# Patient Record
Sex: Female | Born: 1940 | Race: Black or African American | Hispanic: No | Marital: Single | Smoking: Former smoker
Health system: Southern US, Community
[De-identification: ages and names within clinical notes are randomized; demographics above are authoritative.]

## PROBLEM LIST (undated history)

## (undated) DIAGNOSIS — E079 Disorder of thyroid, unspecified: Secondary | ICD-10-CM

## (undated) DIAGNOSIS — E785 Hyperlipidemia, unspecified: Secondary | ICD-10-CM

## (undated) DIAGNOSIS — I10 Essential (primary) hypertension: Secondary | ICD-10-CM

## (undated) DIAGNOSIS — I639 Cerebral infarction, unspecified: Secondary | ICD-10-CM

## (undated) DIAGNOSIS — M8580 Other specified disorders of bone density and structure, unspecified site: Secondary | ICD-10-CM

## (undated) DIAGNOSIS — R0602 Shortness of breath: Secondary | ICD-10-CM

## (undated) DIAGNOSIS — N189 Chronic kidney disease, unspecified: Secondary | ICD-10-CM

## (undated) HISTORY — DX: Chronic kidney disease, unspecified: N18.9

## (undated) HISTORY — DX: Hyperlipidemia, unspecified: E78.5

## (undated) HISTORY — PX: EYE SURGERY: SHX253

## (undated) HISTORY — DX: Other specified disorders of bone density and structure, unspecified site: M85.80

## (undated) HISTORY — DX: Cerebral infarction, unspecified: I63.9

---

## 1998-04-02 ENCOUNTER — Other Ambulatory Visit: Admission: RE | Admit: 1998-04-02 | Discharge: 1998-04-02 | Payer: Self-pay | Admitting: Internal Medicine

## 1998-10-13 ENCOUNTER — Emergency Department (HOSPITAL_COMMUNITY): Admission: EM | Admit: 1998-10-13 | Discharge: 1998-10-13 | Payer: Self-pay | Admitting: Emergency Medicine

## 1998-10-19 ENCOUNTER — Inpatient Hospital Stay (HOSPITAL_COMMUNITY): Admission: AD | Admit: 1998-10-19 | Discharge: 1998-10-22 | Payer: Self-pay | Admitting: Internal Medicine

## 1998-10-20 ENCOUNTER — Encounter: Payer: Self-pay | Admitting: Internal Medicine

## 1999-01-04 ENCOUNTER — Encounter: Payer: Self-pay | Admitting: Internal Medicine

## 1999-01-04 ENCOUNTER — Ambulatory Visit (HOSPITAL_COMMUNITY): Admission: RE | Admit: 1999-01-04 | Discharge: 1999-01-04 | Payer: Self-pay | Admitting: Internal Medicine

## 1999-04-05 ENCOUNTER — Emergency Department (HOSPITAL_COMMUNITY): Admission: EM | Admit: 1999-04-05 | Discharge: 1999-04-05 | Payer: Self-pay | Admitting: Emergency Medicine

## 1999-04-18 ENCOUNTER — Emergency Department (HOSPITAL_COMMUNITY): Admission: EM | Admit: 1999-04-18 | Discharge: 1999-04-18 | Payer: Self-pay | Admitting: Emergency Medicine

## 1999-08-22 ENCOUNTER — Encounter: Payer: Self-pay | Admitting: Internal Medicine

## 1999-08-22 ENCOUNTER — Ambulatory Visit (HOSPITAL_COMMUNITY): Admission: RE | Admit: 1999-08-22 | Discharge: 1999-08-22 | Payer: Self-pay | Admitting: Internal Medicine

## 2000-03-23 ENCOUNTER — Other Ambulatory Visit: Admission: RE | Admit: 2000-03-23 | Discharge: 2000-03-23 | Payer: Self-pay | Admitting: Internal Medicine

## 2000-06-18 ENCOUNTER — Emergency Department (HOSPITAL_COMMUNITY): Admission: EM | Admit: 2000-06-18 | Discharge: 2000-06-18 | Payer: Self-pay | Admitting: Emergency Medicine

## 2000-06-18 ENCOUNTER — Encounter: Payer: Self-pay | Admitting: Emergency Medicine

## 2001-01-19 ENCOUNTER — Emergency Department (HOSPITAL_COMMUNITY): Admission: EM | Admit: 2001-01-19 | Discharge: 2001-01-19 | Payer: Self-pay | Admitting: *Deleted

## 2001-07-22 ENCOUNTER — Emergency Department (HOSPITAL_COMMUNITY): Admission: EM | Admit: 2001-07-22 | Discharge: 2001-07-22 | Payer: Self-pay

## 2002-01-25 ENCOUNTER — Inpatient Hospital Stay (HOSPITAL_COMMUNITY): Admission: EM | Admit: 2002-01-25 | Discharge: 2002-02-06 | Payer: Self-pay

## 2002-02-05 ENCOUNTER — Encounter: Payer: Self-pay | Admitting: *Deleted

## 2002-06-25 ENCOUNTER — Emergency Department (HOSPITAL_COMMUNITY): Admission: EM | Admit: 2002-06-25 | Discharge: 2002-06-26 | Payer: Self-pay | Admitting: Emergency Medicine

## 2002-06-26 ENCOUNTER — Encounter: Payer: Self-pay | Admitting: Emergency Medicine

## 2002-12-23 ENCOUNTER — Encounter: Payer: Self-pay | Admitting: Emergency Medicine

## 2002-12-23 ENCOUNTER — Emergency Department (HOSPITAL_COMMUNITY): Admission: EM | Admit: 2002-12-23 | Discharge: 2002-12-23 | Payer: Self-pay | Admitting: Emergency Medicine

## 2003-01-30 ENCOUNTER — Encounter: Admission: RE | Admit: 2003-01-30 | Discharge: 2003-04-30 | Payer: Self-pay

## 2003-12-08 ENCOUNTER — Emergency Department (HOSPITAL_COMMUNITY): Admission: EM | Admit: 2003-12-08 | Discharge: 2003-12-08 | Payer: Self-pay | Admitting: Emergency Medicine

## 2004-08-01 ENCOUNTER — Encounter (INDEPENDENT_AMBULATORY_CARE_PROVIDER_SITE_OTHER): Payer: Self-pay | Admitting: *Deleted

## 2004-08-01 ENCOUNTER — Ambulatory Visit (HOSPITAL_COMMUNITY): Admission: RE | Admit: 2004-08-01 | Discharge: 2004-08-01 | Payer: Self-pay | Admitting: Gastroenterology

## 2006-02-14 ENCOUNTER — Ambulatory Visit (HOSPITAL_COMMUNITY): Admission: RE | Admit: 2006-02-14 | Discharge: 2006-02-14 | Payer: Self-pay | Admitting: Internal Medicine

## 2006-05-10 ENCOUNTER — Emergency Department (HOSPITAL_COMMUNITY): Admission: EM | Admit: 2006-05-10 | Discharge: 2006-05-10 | Payer: Self-pay | Admitting: Emergency Medicine

## 2006-09-01 ENCOUNTER — Emergency Department (HOSPITAL_COMMUNITY): Admission: EM | Admit: 2006-09-01 | Discharge: 2006-09-01 | Payer: Self-pay | Admitting: Emergency Medicine

## 2007-11-20 ENCOUNTER — Emergency Department (HOSPITAL_COMMUNITY): Admission: EM | Admit: 2007-11-20 | Discharge: 2007-11-20 | Payer: Self-pay | Admitting: *Deleted

## 2008-08-12 ENCOUNTER — Emergency Department (HOSPITAL_COMMUNITY): Admission: EM | Admit: 2008-08-12 | Discharge: 2008-08-12 | Payer: Self-pay | Admitting: Family Medicine

## 2008-08-25 ENCOUNTER — Ambulatory Visit (HOSPITAL_COMMUNITY): Admission: RE | Admit: 2008-08-25 | Discharge: 2008-08-25 | Payer: Self-pay | Admitting: Internal Medicine

## 2009-07-15 ENCOUNTER — Emergency Department (HOSPITAL_COMMUNITY): Admission: EM | Admit: 2009-07-15 | Discharge: 2009-07-15 | Payer: Self-pay | Admitting: Emergency Medicine

## 2009-09-10 ENCOUNTER — Ambulatory Visit (HOSPITAL_COMMUNITY): Admission: RE | Admit: 2009-09-10 | Discharge: 2009-09-10 | Payer: Self-pay | Admitting: Internal Medicine

## 2009-12-15 ENCOUNTER — Encounter: Admission: RE | Admit: 2009-12-15 | Discharge: 2009-12-15 | Payer: Self-pay | Admitting: Internal Medicine

## 2009-12-21 ENCOUNTER — Encounter (INDEPENDENT_AMBULATORY_CARE_PROVIDER_SITE_OTHER): Payer: Self-pay | Admitting: *Deleted

## 2009-12-31 ENCOUNTER — Ambulatory Visit: Payer: Self-pay | Admitting: Gastroenterology

## 2009-12-31 ENCOUNTER — Encounter (INDEPENDENT_AMBULATORY_CARE_PROVIDER_SITE_OTHER): Payer: Self-pay | Admitting: *Deleted

## 2010-01-07 ENCOUNTER — Ambulatory Visit: Payer: Self-pay | Admitting: Gastroenterology

## 2010-09-05 ENCOUNTER — Encounter: Admission: RE | Admit: 2010-09-05 | Discharge: 2010-09-07 | Payer: Self-pay | Admitting: Internal Medicine

## 2010-10-27 ENCOUNTER — Inpatient Hospital Stay (HOSPITAL_COMMUNITY)
Admission: EM | Admit: 2010-10-27 | Discharge: 2010-11-01 | Payer: Self-pay | Source: Home / Self Care | Attending: Internal Medicine | Admitting: Internal Medicine

## 2010-10-27 ENCOUNTER — Encounter (INDEPENDENT_AMBULATORY_CARE_PROVIDER_SITE_OTHER): Payer: Self-pay | Admitting: Internal Medicine

## 2010-10-28 ENCOUNTER — Encounter (INDEPENDENT_AMBULATORY_CARE_PROVIDER_SITE_OTHER): Payer: Self-pay | Admitting: Internal Medicine

## 2010-11-01 ENCOUNTER — Ambulatory Visit (HOSPITAL_COMMUNITY)
Admission: RE | Admit: 2010-11-01 | Discharge: 2010-11-01 | Payer: Self-pay | Source: Home / Self Care | Attending: Internal Medicine | Admitting: Internal Medicine

## 2010-11-28 ENCOUNTER — Encounter: Payer: Self-pay | Admitting: Internal Medicine

## 2010-12-04 NOTE — Consult Note (Addendum)
[name] [name], [name] [name]                 ACCOUNT NO.:  [id number]  MEDICAL RECORD NO.:  [id number]          PATIENT TYPE:  INP  LOCATION:  1423                         FACILITY:  [facility]  PHYSICIAN:  Madolyn Frieze. Jens Som, MD, FACCDATE OF BIRTH:  [birth year]  DATE OF CONSULTATION:  10/28/2010 DATE OF DISCHARGE:                                CONSULTATION   PRIMARY CARE PHYSICIAN:  Andi Devon, MD  PRIMARY CARDIOLOGIST:  None.  CHIEF COMPLAINT:  Elevated cardiac enzymes.  HISTORY OF PRESENT ILLNESS:  [name] [name] is a 70 year old female with no previous history of coronary artery disease.  She came in for nausea and vomiting.  She had elevated CK-MBs and Cardiology was asked to evaluate her.  [name] [name] has no history of exertional or resting chest pain.  She does not exert herself much because of the previous CVA.  She does various things around the house including some light housework.  She goes shopping but generally when she leaves the house or goes any significant distance, she uses a scooter.  She has chronic dyspnea on exertion that has not changed much recently.  She does not go up steps or walk up hills.  She was in her usual state of health until 12:21 p.m.  She was feeling well and ate a piece of candy.  Soon after she ate the piece of candy, she developed nausea and began vomiting.  She vomited 4 or 5 times.  She was unable to keep anything down.  She did not have diarrhea.  She was diaphoretic and felt very hot but did not check her temperature.  She had some shortness of breath as well.  When her symptoms did not resolve, she came by EMS to the hospital.  In the emergency room, she received albuterol, Zofran, 10 mEq of IV potassium and 40 mEq p.o. potassium.  Her symptoms have gradually improved.  She was initially on clear liquids but has been advanced to full liquids and is able to keep those down at this time.  She has had no fever and no elevation in the white  count.  PAST MEDICAL HISTORY: 1. Chronic obstructive pulmonary disease. 2. History of CVA in 2003. 3. Legally blind. 4. Greater than 20-year history of diabetes. 5. Hypertension. 6. Hyperlipidemia. 7. Obesity. 8. Tobacco use. 9. Arthritis.  PAST SURGICAL HISTORY:  Bilateral tubal ligation.  ALLERGIES:  IODINE which causes a rash, and PENICILLIN.  CURRENT MEDICATIONS: 1. Norvasc 10 mg daily. 2. Biotene rinse b.i.d. 3. Artificial tears b.i.d. 4. Aspirin 325 mg a day. 5. Zithromax 500 mg IV daily. 6. Rocephin 1 g IV daily. 7. Heparin 5000 units q.8 h. 8. Sliding scale insulin. 9. NovoLog 20 units b.i.d. 10.Synthroid 25 mcg IV daily. 11.Protonix 40 mg IV b.i.d. 12.K-Dur 40 mEq p.r.n. 13.Prednisone eye drops daily. 14.IV fluids with normal saline at 80 mL an hour.  SOCIAL HISTORY:  She lives in [place] alone.  She has an aide because of her vision.  She has ongoing tobacco use with a greater than 30-pack- year history but denies any history of alcohol or drug abuse.  She  is disabled.  Family lives nearby and comes and eats meals with her.  FAMILY HISTORY:  Noncontributory because of advanced age.  REVIEW OF SYSTEMS:  She has had nasal stuffiness recently but no purulent drainage and no fevers or chills.  She denies PND or orthopnea. She has chronic left lower extremity edema and left-sided weakness since her CVA.  She coughs occasionally and produces white phlegm.  She has a sore throat because of the multiple episodes of vomiting.  Full 14-point review of systems is otherwise negative except as stated in the HPI.  PHYSICAL EXAMINATION:  VITAL SIGNS:  Temperature 98.3, blood pressure 143/79, pulse 73, respiratory rate 18, O2 saturation 97% on 2 L. GENERAL:  She is a well-developed, elderly, African American female in no acute distress.  HEENT:  Appears normal except for poor dentition. NECK:  There is JVD at about 8 cm and questionable faint carotid bruits, but no  thyromegaly.  No lymphadenopathy is noted.  CARDIOVASCULAR: Heart is regular in rate and rhythm with an S1, S2 and no significant murmur, rub or gallop is noted.  Distal pulses are intact in all four extremities but decreased in her lower extremities.  CHEST:  She has some rales at the bases but generally good air exchange.  ABDOMEN:  She has slight right upper quadrant tenderness but no guarding.  Her abdomen is soft and she has active bowel sounds.  SKIN:  No rashes or lesions are noted.  EXTREMITIES:  She has 1+ to 2+ lower extremity edema, right greater than left, with a decreased light right lower extremity DP. NEUROLOGIC:  She is alert and oriented with chronic left upper extremity and left lower extremity weakness.  PERTINENT LABORATORY AND X-RAY DATA:  Hemoglobin 11.7, hematocrit 35.9, WBC 7.5, platelets 257.  Sodium 144, potassium 3.2, chloride 105, CO2 32, BUN 9, creatinine 1.27, glucose 154.  CK-MB #1 291/20.2; #2 354/22.6; #3 418/19.2 (index range is 4.6-6.9). Troponin I is 0.02 and 0.04.  BNP 63.3 on admission, now 133.  Total cholesterol 137, triglycerides 163, HDL 38, LDL 66.  TSH 1.10.  EtOH less than 5.  Urinalysis negative except for 100 protein, and blood cultures x2 are pending.  EKG:  Sinus rhythm, rate 76 with nonspecific T-wave abnormalities and prolonged QT at 479 msec.  CT of the abdomen and pelvis without contrast showed no acute abnormalities, pancreas fills appears normal, multiple gallstones without bile duct dilatation and a tiny umbilical hernia containing small bowel, with mild diverticulosis of the descending colon.  Abdominal x-ray shows benign-appearing abdomen.  Chest x-ray shows heart size normal.  Lungs are clear.  IMPRESSION:  [name] [name] was seen today by Dr. Jens Som, the patient evaluated and the data reviewed.  She is a 70 year old female with a past medical history of diabetes, hypertension, hyperlipidemia, COPD and CVA, who we are asked  to evaluate for elevated CK-MB.  She developed multiple episodes of nausea and vomiting on 12/21 in the evening.  She had no associated chest pain or shortness of breath.  There was diaphoresis.  She denies abdominal pain, diarrhea or other symptoms. Her initial BUN and creatinine were slightly elevated at 15/1.31 with potassium of 3.7.  Her CK-MB was 291/20 with a troponin I of 0.02 and 0.04.  Her EKG is sinus rhythm with nonspecific ST changes and a prolonged QT.  She has an elevated CK-MB but normal troponin I and this is nondiagnostic.  She has nausea and vomiting, but no cardiac symptoms. We will  await an echo.  We do not think her presentation is consistent with a primary cardiac etiology but she has multiple cardiac risk factors.  If the wall motion on her echocardiogram is normal, we can pursue a Myoview, which could be done as an outpatient when she more fully recovers from her acute event.  If there are wall motion abnormalities or significant left ventricular dysfunction, she will need to be cathed.  We recommend that she discontinue tobacco.  She has appropriately been placed on an aspirin and we also recommend a statin at discharge.  Prior to admission, she was on Crestor 10 mg a day.  We will continue to follow her closely with you.  Dictated for:  Olga Millers, MD, [facility].     Theodore Demark, PA-C   ______________________________ Madolyn Frieze. Jens Som, MD, [facility]    RB/MEDQ  D:  10/28/2010  T:  10/28/2010  Job:  [id number]  Electronically Signed by Theodore Demark PA-C on 11/21/2010 04:54:21 PM Electronically Signed by Olga Millers MD [facility] on 12/04/2010 09:01:34 PM

## 2010-12-06 NOTE — Letter (Signed)
Summary: Diabetic Instructions  [facility]  [address] [place], [place] [address]   Phone: [phone]  Fax: [phone]    [name] [name] 09/27/1941 MRN: [id number]   _  _   ORAL DIABETIC MEDICATION INSTRUCTIONS  The day before your procedure:   Take your diabetic pill as you do normally  The day of your procedure:   Do not take your diabetic pill    We will check your blood sugar levels during the admission process and again in Recovery before discharging you home  ________________________________________________________________________  _  _   INSULIN (LONG ACTING) MEDICATION INSTRUCTIONS (Lantus, NPH, 70/30, Humulin, Novolin-N)   The day before your procedure:   Take  your regular evening dose    The day of your procedure:   Do not take your morning dose   Clide Cliff, RN

## 2010-12-06 NOTE — Letter (Signed)
Summary: [facility] Instructions  [facility]  [address] [place], [place] [address]   Phone: [phone]  Fax: [phone]       TATE JERKINS    22-Jun-1955    MRN: [id number]        Procedure Day /Date: Friday 01/07/2010     Arrival Time: 8:30 am      Procedure Time: 9:30 am     Location of Procedure:                    _ x_  [facility] (4th Floor)                        PREPARATION FOR COLONOSCOPY WITH MOVIPREP   Starting 5 days prior to your procedure Sunday 2/27 do not eat nuts, seeds, popcorn, corn, beans, peas,  salads, or any raw vegetables.  Do not take any fiber supplements (e.g. Metamucil, Citrucel, and Benefiber).  THE DAY BEFORE YOUR PROCEDURE         DATE: Thursday 3/3  1.  Drink clear liquids the entire day-NO SOLID FOOD  2.  Do not drink anything colored red or purple.  Avoid juices with pulp.  No orange juice.  3.  Drink at least 64 oz. (8 glasses) of fluid/clear liquids during the day to prevent dehydration and help the prep work efficiently.  CLEAR LIQUIDS INCLUDE: Water Jello Ice Popsicles Tea (sugar ok, no milk/cream) Powdered fruit flavored drinks Coffee (sugar ok, no milk/cream) Gatorade Juice: apple, white grape, white cranberry  Lemonade Clear bullion, consomm, broth Carbonated beverages (any kind) Strained chicken noodle soup Hard Candy                             4.  In the morning, mix first dose of MoviPrep solution:    Empty 1 Pouch A and 1 Pouch B into the disposable container    Add lukewarm drinking water to the top line of the container. Mix to dissolve    Refrigerate (mixed solution should be used within 24 hrs)  5.  Begin drinking the prep at 5:00 p.m. The MoviPrep container is divided by 4 marks.   Every 15 minutes drink the solution down to the next mark (approximately 8 oz) until the full liter is complete.   6.  Follow completed prep with 16 oz of clear liquid of your choice (Nothing red or  purple).  Continue to drink clear liquids until bedtime.  7.  Before going to bed, mix second dose of MoviPrep solution:    Empty 1 Pouch A and 1 Pouch B into the disposable container    Add lukewarm drinking water to the top line of the container. Mix to dissolve    Refrigerate  THE DAY OF YOUR PROCEDURE      DATE: Friday 3/4  Beginning at 4:30 am (5 hours before procedure):         1. Every 15 minutes, drink the solution down to the next mark (approx 8 oz) until the full liter is complete.  2. Follow completed prep with 16 oz. of clear liquid of your choice.    3. You may drink clear liquids until 7:30 am  (2 HOURS BEFORE PROCEDURE).   MEDICATION INSTRUCTIONS  Unless otherwise instructed, you should take regular prescription medications with a small sip of water   as early as possible the morning of  your procedure.  Diabetic patients - see separate instructions.         OTHER INSTRUCTIONS  You will need a responsible adult at least 72 years of age to accompany you and drive you home.   This person must remain in the waiting room during your procedure.  Wear loose fitting clothing that is easily removed.  Leave jewelry and other valuables at home.  However, you may wish to bring a book to read or  an iPod/MP3 player to listen to music as you wait for your procedure to start.  Remove all body piercing jewelry and leave at home.  Total time from sign-in until discharge is approximately 2-3 hours.  You should go home directly after your procedure and rest.  You can resume normal activities the  day after your procedure.  The day of your procedure you should not:   Drive   Make legal decisions   Operate machinery   Drink alcohol   Return to work  You will receive specific instructions about eating, activities and medications before you leave.    The above instructions have been reviewed and explained to me by   Clide Cliff,  RN______________________    I fully understand and can verbalize these instructions _____________________________ Date _________

## 2010-12-06 NOTE — Miscellaneous (Signed)
Summary: DIRECT COLON SCREEN-AGE/YF  Clinical Lists Changes  Medications: Added new medication of MOVIPREP 100 GM  SOLR (PEG-KCL-NACL-NASULF-NA ASC-C) As directed - Signed Rx of MOVIPREP 100 GM  SOLR (PEG-KCL-NACL-NASULF-NA ASC-C) As directed;  #1 x 0;  Signed;  Entered by: Clide Cliff RN;  Authorized by: Louis Meckel MD;  Method used: Faxed to [facility], [address] [place], [place], [place]  [address], Ph: [phone], Fax: [phone] Allergies: Added new allergy or adverse reaction of PENICILLIN Added new allergy or adverse reaction of IODINE Observations: Added new observation of NKA: F (12/31/2009 13:36)    Prescriptions: MOVIPREP 100 GM  SOLR (PEG-KCL-NACL-NASULF-NA ASC-C) As directed  #1 x 0   Entered by:   Clide Cliff RN   Authorized by:   Louis Meckel MD   Signed by:   Clide Cliff RN on 12/31/2009   Method used:   Faxed to ...       [facility] (retail)       [address]       [place], [place]  [address]       Ph: [phone]       Fax: [phone]   RxID:   [id number]

## 2010-12-06 NOTE — Procedures (Signed)
Summary: Colonoscopy  Patient: [name] [name] Note: All result statuses are Final unless otherwise noted.  Tests: (1) Colonoscopy (COL)   COL Colonoscopy           DONE     [facility]     [address]     [place], [place]  [address]           COLONOSCOPY PROCEDURE REPORT           PATIENT:  [name] [name]  MR#:  [id number]     BIRTHDATE:  1941/05/15, 68 yrs. old  GENDER:  female           ENDOSCOPIST:  Barbette Hair. Arlyce Dice, MD     Referred by:           PROCEDURE DATE:  01/07/2010     PROCEDURE:  Colonoscopy, Diagnostic     ASA CLASS:  Class II     INDICATIONS:  Routine Risk Screening           MEDICATIONS:   Fentanyl 25 mcg IV, Versed 5 mg IV           DESCRIPTION OF PROCEDURE:   After the risks benefits and     alternatives of the procedure were thoroughly explained, informed     consent was obtained.  Digital rectal exam was performed and     revealed no abnormalities.   The LB CF-[id number] endoscope     was introduced through the anus and advanced to the cecum, which     was identified by the ileocecal valve, limited by poor     preparation.    The quality of the prep was poor, using MoviPrep.     The instrument was then slowly withdrawn as the colon was fully     examined.     <<PROCEDUREIMAGES>>     FINDINGS:  Mild diverticulosis was found in the sigmoid colon (see     image10).  This was otherwise a normal examination of the colon     (see image1, image2, image4, image5, image6, image8, image9,     image11, image12, image15, and image14).   Retroflexed views i     n the rectum revealed no abnormalities.    The scope was then     withdrawn from the patient and the procedure completed.           COMPLICATIONS:  None           ENDOSCOPIC IMPRESSION:     1) Mild diverticulosis in the sigmoid colon     2) Otherwise normal examination     3) Limited exam due to poor prep     RECOMMENDATIONS:     1) colonoscopy in 5 years because of limitations to exam             REPEAT EXAM:  In 5 year(s) for Colonoscopy.           ______________________________     Barbette Hair. Arlyce Dice, MD           CC:  Kellie Shropshire, MD           n.     Rosalie Doctor:   Barbette Hair. Kaplan at 01/07/2010 10:03 AM           Sharlot Gowda, [id number]  Note: An exclamation mark (!) indicates a result that was not dispersed into the flowsheet. Document Creation Date: 01/07/2010 10:04 AM _______________________________________________________________________  (1) Order result status: Final Collection or observation date-time:  01/07/2010 09:58 Requested date-time:  Receipt date-time:  Reported date-time:  Referring Physician:   Ordering Physician: Melvia Heaps [phone]) Specimen Source:  Source: Launa Grill Order Number: [id number] Lab site:   Appended Document: Colonoscopy    Clinical Lists Changes  Observations: Added new observation of COLONNXTDUE: 01/2015 (01/07/2010 12:46)

## 2011-01-16 LAB — CBC
HCT: 36.3 % (ref 36.0–46.0)
HCT: 37.1 % (ref 36.0–46.0)
Hemoglobin: 11.7 g/dL — ABNORMAL LOW (ref 12.0–15.0)
Hemoglobin: 12.3 g/dL (ref 12.0–15.0)
Hemoglobin: 12.7 g/dL (ref 12.0–15.0)
MCH: 29.8 pg (ref 26.0–34.0)
MCH: 30.3 pg (ref 26.0–34.0)
MCHC: 32.6 g/dL (ref 30.0–36.0)
MCHC: 33.9 g/dL (ref 30.0–36.0)
MCV: 87.9 fL (ref 78.0–100.0)
MCV: 88.5 fL (ref 78.0–100.0)
Platelets: 257 10*3/uL (ref 150–400)
Platelets: 259 10*3/uL (ref 150–400)
RBC: 4.19 MIL/uL (ref 3.87–5.11)
WBC: 7.8 10*3/uL (ref 4.0–10.5)
WBC: 8.9 10*3/uL (ref 4.0–10.5)

## 2011-01-16 LAB — COMPREHENSIVE METABOLIC PANEL
ALT: 13 U/L (ref 0–35)
AST: 26 U/L (ref 0–37)
Albumin: 3 g/dL — ABNORMAL LOW (ref 3.5–5.2)
Albumin: 3.1 g/dL — ABNORMAL LOW (ref 3.5–5.2)
BUN: 11 mg/dL (ref 6–23)
CO2: 32 mEq/L (ref 19–32)
Calcium: 8.3 mg/dL — ABNORMAL LOW (ref 8.4–10.5)
Creatinine, Ser: 1.3 mg/dL — ABNORMAL HIGH (ref 0.4–1.2)
GFR calc Af Amer: 50 mL/min — ABNORMAL LOW (ref >60)
Sodium: 144 mEq/L (ref 135–145)
Total Bilirubin: 0.4 mg/dL (ref 0.3–1.2)
Total Protein: 6.4 g/dL (ref 6.0–8.3)
Total Protein: 6.4 g/dL (ref 6.0–8.3)

## 2011-01-16 LAB — HEPATIC FUNCTION PANEL
ALT: 14 U/L (ref 0–35)
Albumin: 3.4 g/dL — ABNORMAL LOW (ref 3.5–5.2)
Alkaline Phosphatase: 66 U/L (ref 39–117)
Bilirubin, Direct: <0.1 mg/dL (ref 0.0–0.3)
Total Bilirubin: 0.4 mg/dL (ref 0.3–1.2)

## 2011-01-16 LAB — URINALYSIS, ROUTINE W REFLEX MICROSCOPIC
Leukocytes, UA: NEGATIVE
Specific Gravity, Urine: 1.011 (ref 1.005–1.030)

## 2011-01-16 LAB — BASIC METABOLIC PANEL
BUN: 15 mg/dL (ref 6–23)
CO2: 29 mEq/L (ref 19–32)
CO2: 29 mEq/L (ref 19–32)
Calcium: 8.5 mg/dL (ref 8.4–10.5)
Calcium: 9 mg/dL (ref 8.4–10.5)
Chloride: 101 mEq/L (ref 96–112)
Chloride: 105 mEq/L (ref 96–112)
Creatinine, Ser: 1.32 mg/dL — ABNORMAL HIGH (ref 0.4–1.2)
GFR calc Af Amer: 46 mL/min — ABNORMAL LOW (ref >60)
GFR calc Af Amer: 48 mL/min — ABNORMAL LOW (ref >60)
GFR calc Af Amer: 49 mL/min — ABNORMAL LOW (ref >60)
GFR calc non Af Amer: 38 mL/min — ABNORMAL LOW (ref >60)
GFR calc non Af Amer: 40 mL/min — ABNORMAL LOW (ref >60)
Glucose, Bld: 156 mg/dL — ABNORMAL HIGH (ref 70–99)
Potassium: 3.4 mEq/L — ABNORMAL LOW (ref 3.5–5.1)
Potassium: 3.6 mEq/L (ref 3.5–5.1)
Sodium: 138 mEq/L (ref 135–145)
Sodium: 141 mEq/L (ref 135–145)
Sodium: 142 mEq/L (ref 135–145)

## 2011-01-16 LAB — GLUCOSE, CAPILLARY
Glucose-Capillary: 113 mg/dL — ABNORMAL HIGH (ref 70–99)
Glucose-Capillary: 119 mg/dL — ABNORMAL HIGH (ref 70–99)
Glucose-Capillary: 131 mg/dL — ABNORMAL HIGH (ref 70–99)
Glucose-Capillary: 166 mg/dL — ABNORMAL HIGH (ref 70–99)
Glucose-Capillary: 170 mg/dL — ABNORMAL HIGH (ref 70–99)
Glucose-Capillary: 190 mg/dL — ABNORMAL HIGH (ref 70–99)
Glucose-Capillary: 193 mg/dL — ABNORMAL HIGH (ref 70–99)
Glucose-Capillary: 204 mg/dL — ABNORMAL HIGH (ref 70–99)
Glucose-Capillary: 207 mg/dL — ABNORMAL HIGH (ref 70–99)
Glucose-Capillary: 227 mg/dL — ABNORMAL HIGH (ref 70–99)
Glucose-Capillary: 252 mg/dL — ABNORMAL HIGH (ref 70–99)
Glucose-Capillary: 262 mg/dL — ABNORMAL HIGH (ref 70–99)
Glucose-Capillary: 276 mg/dL — ABNORMAL HIGH (ref 70–99)
Glucose-Capillary: 289 mg/dL — ABNORMAL HIGH (ref 70–99)
Glucose-Capillary: 327 mg/dL — ABNORMAL HIGH (ref 70–99)

## 2011-01-16 LAB — DIFFERENTIAL
Basophils Absolute: 0 10*3/uL (ref 0.0–0.1)
Lymphs Abs: 1.3 10*3/uL (ref 0.7–4.0)
Neutrophils Relative %: 76 % (ref 43–77)

## 2011-01-16 LAB — CULTURE, BLOOD (ROUTINE X 2)
Culture  Setup Time: 201112221800
Culture: NO GROWTH
Culture: NO GROWTH

## 2011-01-16 LAB — CARDIAC PANEL(CRET KIN+CKTOT+MB+TROPI)
CK, MB: 6.9 ng/mL (ref 0.3–4.0)
Total CK: 332 U/L — ABNORMAL HIGH (ref 7–177)
Total CK: 371 U/L — ABNORMAL HIGH (ref 7–177)
Troponin I: 0.03 ng/mL (ref 0.00–0.06)
Troponin I: 0.03 ng/mL (ref 0.00–0.06)

## 2011-01-16 LAB — LACTIC ACID, PLASMA: Lactic Acid, Venous: 1.8 mmol/L (ref 0.5–2.2)

## 2011-01-16 LAB — CK TOTAL AND CKMB (NOT AT ARMC)
CK, MB: 19.2 ng/mL (ref 0.3–4.0)
CK, MB: 20.2 ng/mL (ref 0.3–4.0)
CK, MB: 22.6 ng/mL (ref 0.3–4.0)
Relative Index: 6.4 — ABNORMAL HIGH (ref 0.0–2.5)
Relative Index: 6.9 — ABNORMAL HIGH (ref 0.0–2.5)
Total CK: 291 U/L — ABNORMAL HIGH (ref 7–177)
Total CK: 354 U/L — ABNORMAL HIGH (ref 7–177)

## 2011-01-16 LAB — TSH
TSH: 0.61 u[IU]/mL (ref 0.350–4.500)
TSH: 1.101 u[IU]/mL (ref 0.350–4.500)

## 2011-01-16 LAB — URINE MICROSCOPIC-ADD ON

## 2011-01-16 LAB — BRAIN NATRIURETIC PEPTIDE: Pro B Natriuretic peptide (BNP): 133 pg/mL — ABNORMAL HIGH (ref 0.0–100.0)

## 2011-01-16 LAB — LIPID PANEL: Cholesterol: 137 mg/dL (ref 0–200)

## 2011-01-16 LAB — PHOSPHORUS: Phosphorus: 2.8 mg/dL (ref 2.3–4.6)

## 2011-01-16 LAB — TROPONIN I: Troponin I: 0.04 ng/mL (ref 0.00–0.06)

## 2011-01-16 LAB — MAGNESIUM: Magnesium: 2.3 mg/dL (ref 1.5–2.5)

## 2011-01-16 LAB — ETHANOL: Alcohol, Ethyl (B): <5 mg/dL (ref 0–10)

## 2011-01-29 LAB — GLUCOSE, CAPILLARY: Glucose-Capillary: 75 mg/dL (ref 70–99)

## 2011-03-24 NOTE — Op Note (Signed)
[name] [name], [name] [name]                 ACCOUNT NO.:  [id number]   MEDICAL RECORD NO.:  [id number]          PATIENT TYPE:  AMB   LOCATION:  ENDO                         FACILITY:  [facility]   PHYSICIAN:  Anselmo Rod, M.D.  DATE OF BIRTH:  05/01/1941   DATE OF PROCEDURE:  08/01/2004  DATE OF DISCHARGE:                                 OPERATIVE REPORT   PROCEDURE PERFORMED:  Screening colonoscopy.   ENDOSCOPIST:  Anselmo Rod, M.D.   INSTRUMENT USED:  Olympus video colonoscope.   INDICATION FOR PROCEDURE:  A 70 year old African-American female undergoing  colonoscopy for iron-deficiency anemia.  Rule out colonic polyps, masses,  etc.   PREPROCEDURE PREPARATION:  Informed consent was procured from the patient.  The patient was fasted for eight hours prior to the procedure and prepped  with a bottle of magnesium citrate and a gallon of GoLYTELY the night prior  to the procedure.   PREPROCEDURE PHYSICAL:  VITAL SIGNS:  The patient had stable vital signs.  NECK:  Supple.  CHEST:  Clear to auscultation.  S1, S2 regular.  ABDOMEN:  Soft with normal bowel sounds.   DESCRIPTION OF PROCEDURE:  The patient was placed in the left lateral  decubitus position and sedated with Demerol and Versed.  Once the patient  was adequately sedate and maintained on low-flow oxygen and continuous  cardiac monitoring, the Olympus video colonoscope was advanced from the  rectum to the cecum.  Small internal hemorrhoids were seen on retroflexion  in the rectum.  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  This was not  bleeding and therefore it was not ablated.  There was a large amount of  residual stool in the colon.  Multiple washes were done.  The appendiceal  orifice and ileocecal valve were clearly visualized and photographed.  Small  lesions could be missed.  No masses, polyps, erosions, ulcerations, or  diverticula were seen.   IMPRESSION:  1.  Small internal hemorrhoids.  2.  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  3.  Large amount of residual stool in the colon, small lesions could be      missed.   RECOMMENDATIONS:  1.  Repeat CBC on outpatient basis.  2.  Repeat colonoscopy in the next five years unless the patient develops      any abnormal symptoms in the interim.       JNM/MEDQ  D:  08/01/2004  T:  08/02/2004  Job:  [id number]   cc:   Merlene Laughter. Renae Gloss, M.D.  [address]  [address]  [place]  [place] [address]  Fax: [phone]

## 2011-03-24 NOTE — Op Note (Signed)
NAMENIMRIT, KEHRES                 ACCOUNT NO.:  [id number]   MEDICAL RECORD NO.:  [id number]          PATIENT TYPE:  AMB   LOCATION:  ENDO                         FACILITY:  [facility]   PHYSICIAN:  Anselmo Rod, M.D.  DATE OF BIRTH:  [birth year]   DATE OF PROCEDURE:  08/01/2004  DATE OF DISCHARGE:                                 OPERATIVE REPORT   PROCEDURE PERFORMED:  Esophagogastroduodenoscopy with biopsies.   ENDOSCOPIST:  Anselmo Rod, M.D.   INSTRUMENT USED:  Olympus video panendoscope.   INDICATION FOR PROCEDURE:  Iron-deficiency anemia in a 70 year old African-  American female with a history of diabetes and legal blindness.  Rule out  peptic ulcer disease, esophagitis, gastritis, etc.   PREPROCEDURE PREPARATION:  Informed consent was procured from the patient.  The patient was fasted for eight hours prior to the procedure.   PREPROCEDURE PHYSICAL:  VITAL SIGNS:  The patient had stable vital signs.  NECK:  Supple.  CHEST:  Clear to auscultation.  S1, S2 regular.  ABDOMEN:  Soft with normal bowel sounds.   DESCRIPTION OF PROCEDURE:  The patient was placed in the left lateral  decubitus position and sedated with 75 mg of Demerol and 5 mg of Versed in  slow incremental doses.  Once the patient was adequately sedate and  maintained on low-flow oxygen and continuous cardiac monitoring, the Olympus  video panendoscope was advanced through the mouthpiece, over the tongue, and  into the esophagus under direct vision.  The entire esophagus appeared  normal with no evidence of ring, stricture, masses, esophagitis, or  Barrett's mucosa.  The scope was then advanced into the stomach.  A small  hiatal hernia was seen on high retroflexion.  Antral biopsies were done to  rule out H. pylori.  A few erosions were seen in the antrum.  The proximal  small bowel appeared normal.  No ulcers or masses were identified.  The  patient tolerated the procedure well without immediate  complications.   IMPRESSION:  1.  Normal-appearing esophagus.  2.  Small hiatal hernia.  3.  Antral erosions, biopsy done for Helicobacter pylori.  4.  Normal proximal small bowel.   RECOMMENDATIONS:  1.  Await pathology results.  2.  Avoid nonsteroidals.  3.  Follow antireflux measures.  4.  Proceed with a colonoscopy at this time.  Further recommendations will      be made after colonoscopy has been done.       JNM/MEDQ  D:  08/01/2004  T:  08/02/2004  Job:  [id number]   cc:   Merlene Laughter. Renae Gloss, M.D.  [address]  [address]  [place]  [place] [address]  Fax: [phone]

## 2011-03-24 NOTE — Discharge Summary (Signed)
[place]. [facility]  Patient:    [name] [name], [name] [name] Visit Number: [id number] MRN: [id number]          Service Type: MED Location: [id number] Attending Physician:  Melvyn Novas Dictated by:   Deanna Artis. Sharene Skeans, M.D. Admit Date:  01/25/2002 Disc. Date: 02/06/02   CC:         [name] [name]. Renae Gloss, M.D.   Discharge Summary  FINAL DIAGNOSES: 1. 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 2. Hypertension without hypercholesterolemia, 404.10. 3. Insulin-dependent diabetes mellitus. 4. Obesity. 5. Gastroesophageal reflux/dyspepsia.  PROCEDURES: 1. CT scan of the brain without contrast on 01/25/02. 2. MRI scan of the brain with MR angiography on 01/26/02. 3. Cerebral arteriogram on 02/05/02. 4. A 2-D echocardiogram on 01/27/02.  COMPLICATIONS:  None.  HOSPITAL COURSE:  [name] [name] is a 70 year old woman admitted with a sensation of numbness on her left side, left facial weakness, evidence of peripheral polyneuropathy, obesity, hypertension, diabetes, and a history of hypercholesterolemia.  CT scan on admission showed preventricular white matter disease probably microvascular ischemic change, remote appearing bilateral basal ganglia lacunar infarctions.  Also an area of low attenuation in the right frontoparietal white matter that could not be ruled out as a white matter infarction, no evidence of intracranial hemorrhage.  MRI of the brain on 01/26/02, showed an infarction of 7 mm in the right thalmus, and slight ischemic changes in the right anterior parietal subcortical white matter. There were also corresponding lesions in the flair and 2T weighted images. There were subcentimeter focal areas of increased signal intensity in the subcortical white matter bifrontally involving the centrum, semiovale, and the trigonal white matter regions.  The most prominent lesion, however, was in the right thalmus as noted  above.  There was also evidence of lacunar infarctions in the basal ganglia regions bilaterally, left greater than right, and confluent periventricular subcortical white matter disease, as well as white matter disease in the paramedian pons bilaterally, the medial aspect of the right cerebral hemisphere, lacunar infarction in the left cerebral hemisphere posteriorly, lacunar infarctions in the left thalmus, left genu of the internal capsule.  There were mild sinusitis changes in the ethmoids.  MRA showed evidence of severe decrease in caliber involving the cavernous superquinoid junction at the right internal carotid artery.  Focal segmental stenosis of the superior division of the right middle cerebral artery, and basilar artery showed a signal drop off of focal severe disease and caliber in the mid segment distal to the origin of the anterior inferior cerebellar arteries.  Narrowing of the distal 1/3 of the basal artery.  Other arteries appeared to be normal.  Angiogram of the neck showed broad based shallow smooth plaque involving the right carotid bulb which was insignificant, moderate stenosis of the origin of the right external carotid artery.  Video fluoroscopy carried out on 01/28/02, showed no evidence of vestibular penetration or aspiration.  EKG showed normal sinus rhythm, ST-T wave abnormalities.  A 2-D echocardiogram showed left ventricular systolic function that was normal, left ventricular ejection fraction estimated at 55 to 65%, left ventricular wall thickness was mildly increased.  There did not appear to be regional functional differences of the left ventricular wall.  The study was inadequate to exclude the possibility of a left atrial thrombus.  The patient was on telemetry for several days, and did not show evidence of pathologic arrhythmia.  LABORATORY DATA:  Occult stool for blood was negative on 02/06/02.  PTT was 104 at 0500, and was not checked again prior to  angiogram.  White blood cell count 7700, hemoglobin 12.6, hematocrit 36.1, MCV 85.7, platelet count 232,000.  The patient had numerous CBCs throughout which demonstrated mild anemia that was normocytic, and no other significant changes.  Occult blood was also negative on February 04, 2002.  Most recent basic metabolic panel on 02/04/02, sodium 137, potassium 4.0, chloride 105, CO2 25, glucose 114, BUN 21, creatinine 1.1, calcium 8.8.  Comprehensive metabolic panel on 01/25/02, the day of admission, showed a total protein of 6.4, albumin low at 2.9, AST 23, ALT 15, ALP 74, total bilirubin 0.5, magnesium was 2.2.  Serum homocystine was 7.16.  Creatinine kinase 106.  Troponin-I 0.02.  Lipid profile showed a cholesterol of 205, triglycerides 120, HDL cholesterol 51, total cholesterol to HDL ratio 4.0, VLDL cholesterol 24, LDL cholesterol 130 (high-normal).  Methanol was less than 5 (I am not sure why this was done). Urinalysis showed a specific gravity of 1.007, pH 6.5, moderate hemoglobin, at the same time there were 0 to 2 red blood cells (this is inconsistent and may represent myoglobin), protein 30, urobilinogen 0.2.  Methylmalonic acid quantitative was less than 0.1.  This fit within the reference range.  The patient had difficulty with intermittent elevated capillary blood glucoses typically toward evening.  This was treated on more than one occasion with sliding scale insulin, and may indicate a need to increase the patients baseline dose of 70/30 in the evening.  Her glucose this morning is 250, after having risen to 400, and retested at 330 last evening.  I suspect the patient may need to have an increase in her 70/30 to 17 units at 5 p.m., and I have so ordered that.  This will need to be carefully measured as an outpatient.  Her blood pressures have been labile, and have been as low as 132/62, and as high at 184/86.  I think that she will need more careful titration of her blood pressure  as an outpatient with the ultimate goal of pressures in the 130/70 range if she can tolerate them.   PHYSICAL EXAMINATION:  GENERAL:  The patient is well and well appearing.  VITAL SIGNS:  Blood pressure 172/86, resting pulse 98, respiratory rate 18, temperature 98.1, pulse oximetry 94%.  HEENT:  No signs of infection.  NECK:  Supple, full range of motion.  No cranial or cervical bruits.  LUNGS:  Clear to auscultation.  HEART:  No murmurs, pulses normal.  ABDOMEN:  Soft, nontender, bowel sounds normal.  EXTREMITIES:  Well formed without edema, cyanosis, alterations of tone, or tight heel cords.  The patient is morbidly obese.  NEUROLOGIC:  Mental status:  Awake, alert, attentive, appropriate, no dysphasia or dyspraxia.  Cranial nerve examination:  Round reactive pupils, normal fundi, full visual fields to double simultaneous stimuli.  Extraocular movements full and conjugate.  Okay in responses and equal bilaterally. Symmetric facial strength and sensation.  Ear conduction greater than bone conduction bilaterally.  Motor examination:  Normal strength, tone, and mass.  Good fine motor movements, no pronator drift.  Sensation shows patchy numbness of the left side with good preservation of vibration, stereoagnosis.  Cerebellar examination:  Good finger-to-nose, rapid repetitive movements.  Gait and station was slightly broad based, but not undsteady.  She can get up on her heels and toes with some assistance.  She can turn and pivot without losing her balance.  Deep tendon reflexes are present, but diminished  proximally, absent distally.  She had bilateral flexor plantar responses.  CONDITION ON DISCHARGE:  The patient is discharged in improved condition.  FOLLOWUP: 1. Dr. Kellie Shropshire in approximately two weeks to address issues related to her    blood pressure and glucose control.  Is to contact Dr. Renae Gloss in the    interim should she have any difficulties regarding her  blood glucose. 2. She should be seen in my office in six weeks with Demetrio Lapping on a day    when I am there.  Telephone number is [phone].  DISCHARGE MEDICATIONS: 1. Aspirin 325 mg q.d., do not use enteric coated. 2. Lotensin 20 mg q.d. 3. Hydrochlorothiazide 25 mg q.d. 4. Humulin insulin 70/30 30 units at 8 a.m., 17 units at 5 p.m. 5. Pepcid 20 mg q.d. 6. Catapres TTS-3 patch change weekly.  ACTIVITY:  The patient should walk as tolerated.  She is not to lift more than 5 pounds this weekend while the region in her groin heals.  Incidentally, there is no hematoma there, the leg is warm, and dorsalis pedis pulse is normal.  DIET:  Low salt, low fat, no concentrated sweets, 1800 ADA diabetic diet. Dictated by:   Deanna Artis. Sharene Skeans, M.D. Attending Physician:  Melvyn Novas DD:  02/06/02 TD:  02/06/02 Job: [id number]

## 2011-03-30 ENCOUNTER — Ambulatory Visit: Payer: PRIVATE HEALTH INSURANCE | Attending: Internal Medicine | Admitting: Physical Therapy

## 2011-03-30 DIAGNOSIS — R262 Difficulty in walking, not elsewhere classified: Secondary | ICD-10-CM | POA: Insufficient documentation

## 2011-03-30 DIAGNOSIS — IMO0001 Reserved for inherently not codable concepts without codable children: Secondary | ICD-10-CM | POA: Insufficient documentation

## 2011-07-27 LAB — DIFFERENTIAL
Basophils Absolute: 0
Basophils Relative: 1
Eosinophils Absolute: 0.1
Monocytes Absolute: 0.6
Monocytes Relative: 7
Neutrophils Relative %: 59

## 2011-07-27 LAB — BASIC METABOLIC PANEL WITH GFR
BUN: 15
CO2: 30
Calcium: 9
Chloride: 100
Creatinine, Ser: 1.23 — ABNORMAL HIGH
GFR calc non Af Amer: 43 — ABNORMAL LOW
Glucose, Bld: 100 — ABNORMAL HIGH
Potassium: 2.9 — ABNORMAL LOW
Sodium: 140

## 2011-07-27 LAB — CBC
MCHC: 35
MCV: 85
RBC: 4.6
RDW: 13.3

## 2011-08-12 ENCOUNTER — Emergency Department (HOSPITAL_COMMUNITY)
Admission: EM | Admit: 2011-08-12 | Discharge: 2011-08-13 | Disposition: A | Discharge status: Home / Self Care | Payer: PRIVATE HEALTH INSURANCE | Attending: Emergency Medicine | Admitting: Emergency Medicine

## 2011-08-12 DIAGNOSIS — I10 Essential (primary) hypertension: Secondary | ICD-10-CM | POA: Insufficient documentation

## 2011-08-12 DIAGNOSIS — Z8673 Personal history of transient ischemic attack (TIA), and cerebral infarction without residual deficits: Secondary | ICD-10-CM | POA: Insufficient documentation

## 2011-08-12 DIAGNOSIS — R252 Cramp and spasm: Secondary | ICD-10-CM | POA: Insufficient documentation

## 2011-08-12 DIAGNOSIS — R609 Edema, unspecified: Secondary | ICD-10-CM | POA: Insufficient documentation

## 2011-08-12 DIAGNOSIS — E119 Type 2 diabetes mellitus without complications: Secondary | ICD-10-CM | POA: Insufficient documentation

## 2011-08-12 DIAGNOSIS — F17200 Nicotine dependence, unspecified, uncomplicated: Secondary | ICD-10-CM | POA: Insufficient documentation

## 2011-08-12 DIAGNOSIS — E860 Dehydration: Secondary | ICD-10-CM | POA: Insufficient documentation

## 2011-08-12 DIAGNOSIS — R10819 Abdominal tenderness, unspecified site: Secondary | ICD-10-CM | POA: Insufficient documentation

## 2011-08-12 DIAGNOSIS — E876 Hypokalemia: Secondary | ICD-10-CM | POA: Insufficient documentation

## 2011-08-12 DIAGNOSIS — E7800 Pure hypercholesterolemia, unspecified: Secondary | ICD-10-CM | POA: Insufficient documentation

## 2011-08-13 ENCOUNTER — Emergency Department (HOSPITAL_COMMUNITY): Payer: PRIVATE HEALTH INSURANCE

## 2011-08-13 LAB — DIFFERENTIAL
Basophils Relative: 0 % (ref 0–1)
Eosinophils Absolute: 0.2 10*3/uL (ref 0.0–0.7)
Eosinophils Relative: 2 % (ref 0–5)
Lymphs Abs: 2.4 10*3/uL (ref 0.7–4.0)
Monocytes Relative: 10 % (ref 3–12)

## 2011-08-13 LAB — CBC
MCH: 29.6 pg (ref 26.0–34.0)
MCV: 86.8 fL (ref 78.0–100.0)
Platelets: 291 10*3/uL (ref 150–400)
RDW: 12.8 % (ref 11.5–15.5)
WBC: 7.5 10*3/uL (ref 4.0–10.5)

## 2011-08-13 LAB — URINALYSIS, ROUTINE W REFLEX MICROSCOPIC
Glucose, UA: NEGATIVE mg/dL
Ketones, ur: NEGATIVE mg/dL
Leukocytes, UA: NEGATIVE
Protein, ur: NEGATIVE mg/dL
Urobilinogen, UA: 0.2 mg/dL (ref 0.0–1.0)

## 2011-08-13 LAB — POCT I-STAT TROPONIN I: Troponin i, poc: 0.03 ng/mL (ref 0.00–0.08)

## 2011-08-13 LAB — COMPREHENSIVE METABOLIC PANEL
BUN: 24 mg/dL — ABNORMAL HIGH (ref 6–23)
Calcium: 9.5 mg/dL (ref 8.4–10.5)
GFR calc Af Amer: 40 mL/min — ABNORMAL LOW (ref >90)
Glucose, Bld: 75 mg/dL (ref 70–99)
Total Protein: 8.1 g/dL (ref 6.0–8.3)

## 2011-08-13 LAB — LIPASE, BLOOD: Lipase: 34 U/L (ref 11–59)

## 2011-08-14 LAB — GLUCOSE, CAPILLARY: Glucose-Capillary: 84 mg/dL (ref 70–99)

## 2011-08-14 LAB — URINE CULTURE: Culture  Setup Time: 201210071139

## 2012-01-15 ENCOUNTER — Other Ambulatory Visit (HOSPITAL_COMMUNITY): Payer: Self-pay | Admitting: Internal Medicine

## 2012-01-15 DIAGNOSIS — Z1231 Encounter for screening mammogram for malignant neoplasm of breast: Secondary | ICD-10-CM

## 2012-02-07 ENCOUNTER — Ambulatory Visit (HOSPITAL_COMMUNITY): Payer: PRIVATE HEALTH INSURANCE

## 2012-02-09 ENCOUNTER — Ambulatory Visit (HOSPITAL_COMMUNITY)
Admission: RE | Admit: 2012-02-09 | Discharge: 2012-02-09 | Disposition: A | Discharge status: Home / Self Care | Payer: PRIVATE HEALTH INSURANCE | Source: Ambulatory Visit | Attending: Internal Medicine | Admitting: Internal Medicine

## 2012-02-09 DIAGNOSIS — Z1231 Encounter for screening mammogram for malignant neoplasm of breast: Secondary | ICD-10-CM | POA: Insufficient documentation

## 2012-04-25 ENCOUNTER — Encounter (HOSPITAL_COMMUNITY): Payer: Self-pay | Admitting: *Deleted

## 2012-04-25 ENCOUNTER — Emergency Department (HOSPITAL_COMMUNITY)
Admission: EM | Admit: 2012-04-25 | Discharge: 2012-04-26 | Disposition: A | Discharge status: Home Health | Payer: PRIVATE HEALTH INSURANCE | Attending: Emergency Medicine | Admitting: Emergency Medicine

## 2012-04-25 DIAGNOSIS — Z7982 Long term (current) use of aspirin: Secondary | ICD-10-CM | POA: Insufficient documentation

## 2012-04-25 DIAGNOSIS — Z8673 Personal history of transient ischemic attack (TIA), and cerebral infarction without residual deficits: Secondary | ICD-10-CM | POA: Insufficient documentation

## 2012-04-25 DIAGNOSIS — E119 Type 2 diabetes mellitus without complications: Secondary | ICD-10-CM | POA: Insufficient documentation

## 2012-04-25 DIAGNOSIS — Z79899 Other long term (current) drug therapy: Secondary | ICD-10-CM | POA: Insufficient documentation

## 2012-04-25 DIAGNOSIS — R252 Cramp and spasm: Secondary | ICD-10-CM | POA: Insufficient documentation

## 2012-04-25 DIAGNOSIS — I10 Essential (primary) hypertension: Secondary | ICD-10-CM | POA: Insufficient documentation

## 2012-04-25 HISTORY — DX: Essential (primary) hypertension: I10

## 2012-04-25 MED ORDER — KETOROLAC TROMETHAMINE 30 MG/ML IJ SOLN
30.0000 mg | Freq: Once | INTRAMUSCULAR | Status: AC
  Administered 2012-04-26: 30 mg via INTRAVENOUS
  Filled 2012-04-25: qty 1

## 2012-04-25 NOTE — ED Notes (Signed)
ZOX:[id number]<AV> Expected date:04/25/12<BR> Expected time:11:14 PM<BR> Means of arrival:Ambulance<BR> Comments:<BR> Hold ems for cramps

## 2012-04-25 NOTE — ED Provider Notes (Signed)
History     CSN: [id number]  Arrival date & time 04/25/12  2323   First MD Initiated Contact with Patient 04/25/12 2333      Chief Complaint  Patient presents with  . Muscle cramps     (Consider location/radiation/quality/duration/timing/severity/associated sxs/prior treatment) HPI Comments: 71 year old female with a history of hypertension, diabetes, stroke who presents with a complaint of cramping of her muscles. She states that over the last several days she has had cramping of her bilateral hands, arms, feet and lower extremities, tabs in location. She denies any chest pain, shortness of breath, back pain though she does have some neck pain on the left side in her trapezius area. She denies blurred vision and has been able to ambulate with her walker or cane at baseline. She states that she was changed on her blood pressure medications and started taking hydralazine on June 13 and ever since that time she has had symptoms. There has been no fevers, chills, nausea, vomiting, she has been able to tolerate oral fluids and solids without difficulty and is having normal bowel movements, no dysuria.  The history is provided by the patient, the spouse and medical records.    Past Medical History  Diagnosis Date  . Hypertension   . Diabetes mellitus   . Stroke     History reviewed. No pertinent past surgical history.  No family history on file.  History  Substance Use Topics  . Smoking status: Not on file  . Smokeless tobacco: Not on file  . Alcohol Use:     OB History    Grav Para Term Preterm Abortions TAB SAB Ect Mult Living                  Review of Systems  All other systems reviewed and are negative.    Allergies  Iodine and Penicillins  Home Medications   Current Outpatient Rx  Name Route Sig Dispense Refill  . ASPIRIN EC 81 MG PO TBEC Oral Take 81 mg by mouth daily.    . CYCLOBENZAPRINE HCL 10 MG PO TABS Oral Take 10 mg by mouth daily.    . FUROSEMIDE 20  MG PO TABS Oral Take 20 mg by mouth 2 (two) times daily.    Marland Kitchen GABAPENTIN 300 MG PO CAPS Oral Take 300 mg by mouth daily.    Marland Kitchen GLIMEPIRIDE 4 MG PO TABS Oral Take 4 mg by mouth daily before breakfast.    . HYDRALAZINE HCL 10 MG PO TABS Oral Take 10 mg by mouth 2 (two) times daily with a meal.    . HYDROCHLOROTHIAZIDE 25 MG PO TABS Oral Take 25 mg by mouth daily.    Marland Kitchen LEVOTHYROXINE SODIUM 50 MCG PO TABS Oral Take 50 mcg by mouth daily.    Marland Kitchen LISINOPRIL 20 MG PO TABS Oral Take 20 mg by mouth daily.    Marland Kitchen OMEPRAZOLE 40 MG PO CPDR Oral Take 40 mg by mouth daily.    Marland Kitchen POTASSIUM CHLORIDE CRYS ER 20 MEQ PO TBCR Oral Take 20 mEq by mouth 2 (two) times daily.    Marland Kitchen NAPROXEN 500 MG PO TABS Oral Take 1 tablet (500 mg total) by mouth 2 (two) times daily with a meal. 30 tablet 0    BP 147/71  Pulse 83  Temp 99 F (37.2 C) (Oral)  Resp 16  SpO2 96%  Physical Exam  Nursing note and vitals reviewed. Constitutional: She appears well-developed and well-nourished. No distress.  HENT:  Head:  Normocephalic and atraumatic.  Mouth/Throat: Oropharynx is clear and moist. No oropharyngeal exudate.  Eyes: Conjunctivae and EOM are normal. Pupils are equal, round, and reactive to light. Right eye exhibits no discharge. Left eye exhibits no discharge. No scleral icterus.  Neck: Normal range of motion. Neck supple. No JVD present. No thyromegaly present.  Cardiovascular: Normal rate, regular rhythm, normal heart sounds and intact distal pulses.  Exam reveals no gallop and no friction rub.   No murmur heard. Pulmonary/Chest: Effort normal and breath sounds normal. No respiratory distress. She has no wheezes. She has no rales.  Abdominal: Soft. Bowel sounds are normal. She exhibits no distension and no mass. There is no tenderness.  Musculoskeletal: Normal range of motion. She exhibits tenderness ( mild ttp in the calf on the L, bialteral hands and the neck on the L over teh trapezius). She exhibits no edema.    Lymphadenopathy:    She has no cervical adenopathy.  Neurological: She is alert. Coordination normal.       Normal strength and sensation of the bilateral upper and lower strumming, cranial nerves III through XII intact, no facial droop, clear speech  Skin: Skin is warm and dry. No rash noted. No erythema.  Psychiatric: She has a normal mood and affect. Her behavior is normal.    ED Course  Procedures (including critical care time)  Labs Reviewed  BASIC METABOLIC PANEL - Abnormal; Notable for the following:    Glucose, Bld 103 (*)     Creatinine, Ser 1.46 (*)     GFR calc non Af Amer 35 (*)     GFR calc Af Amer 40 (*)     All other components within normal limits  CK   No results found.   1. Muscle cramps       MDM  The patient has a benign exam, follow vital signs, check potassium and renal function, and a creatine kinase to rule out rhabdomyolysis.  After Toradol the patient states that her symptoms are gone, her vital signs are normal, her CK is normal and her renal function is baseline. Potassium is normal. Will d discharge home to followup with family doctor tomorrow regarding continued use of hydralazine. There does not appear to be any acute medical conditions that require stabilizing therapy at this time. I have discussed with the patient and her results and encouraged her followup should her symptoms worsen. She has expressed her understanding to the indications for followup.      Vida Roller, MD 04/26/12 [phone]

## 2012-04-25 NOTE — ED Notes (Signed)
Per EMS:  Pt was switched to Hydralazine on 6/13 and st's ever sine then pt has had generalized body cramps.  Conscious, alert, oriented x4, pt in nad.  No chest pain, no SOB.

## 2012-04-26 LAB — BASIC METABOLIC PANEL
BUN: 15 mg/dL (ref 6–23)
CO2: 26 mEq/L (ref 19–32)
Chloride: 99 mEq/L (ref 96–112)
Creatinine, Ser: 1.46 mg/dL — ABNORMAL HIGH (ref 0.50–1.10)
Glucose, Bld: 103 mg/dL — ABNORMAL HIGH (ref 70–99)

## 2012-04-26 LAB — CK: Total CK: 49 U/L (ref 7–177)

## 2012-04-26 MED ORDER — NAPROXEN 500 MG PO TABS: 500.0000 mg | ORAL_TABLET | Freq: Two times a day (BID) | ORAL | Status: AC

## 2012-04-26 NOTE — Discharge Instructions (Signed)
Leg Cramps  Leg cramps that occur during exercise can be caused by poor circulation or dehydration. However, muscle cramps that occur at rest or during the night are usually not due to any serious medical problem. Heat cramps may cause muscle spasms during hot weather.   CAUSES  There is no clear cause for muscle cramps. However, dehydration may be a factor for those who do not drink enough fluids and those who exercise in the heat. Imbalances in the level of sodium, potassium, calcium or magnesium in the muscle tissue may also be a factor. Some medications, such as water pills (diuretics), may cause loss of chemicals that the body needs (like sodium and potassium) and cause muscle cramps.  TREATMENT   · Make sure your diet has enough fluids and essential minerals for the muscle to work normally.  · Avoid strenuous exercise for several days if you have been having frequent leg cramps.  · Stretch and massage the cramped muscle for several minutes.  · Some medicines may be helpful in some patients with night cramps. Only take over-the-counter or prescription medicines as directed by your caregiver.  SEEK IMMEDIATE MEDICAL CARE IF:   · Your leg cramps become worse.  · Your foot becomes cold, numb, or blue.  Document Released: 11/30/2004 Document Revised: 10/12/2011 Document Reviewed: 11/17/2008  [employer]® Patient Information ©2012 [employer].

## 2013-01-29 ENCOUNTER — Other Ambulatory Visit (HOSPITAL_COMMUNITY): Payer: Self-pay | Admitting: Internal Medicine

## 2013-01-29 DIAGNOSIS — Z1231 Encounter for screening mammogram for malignant neoplasm of breast: Secondary | ICD-10-CM

## 2013-02-05 ENCOUNTER — Ambulatory Visit
Admission: RE | Admit: 2013-02-05 | Discharge: 2013-02-05 | Disposition: A | Discharge status: Home / Self Care | Payer: PRIVATE HEALTH INSURANCE | Source: Ambulatory Visit | Attending: Internal Medicine | Admitting: Internal Medicine

## 2013-02-05 ENCOUNTER — Other Ambulatory Visit: Payer: Self-pay | Admitting: Internal Medicine

## 2013-02-05 DIAGNOSIS — Z720 Tobacco use: Secondary | ICD-10-CM

## 2013-02-05 DIAGNOSIS — R0602 Shortness of breath: Secondary | ICD-10-CM

## 2013-02-10 ENCOUNTER — Ambulatory Visit (HOSPITAL_COMMUNITY)
Admission: RE | Admit: 2013-02-10 | Discharge: 2013-02-10 | Disposition: A | Discharge status: Home / Self Care | Payer: Medicare HMO | Source: Ambulatory Visit | Attending: Internal Medicine | Admitting: Internal Medicine

## 2013-02-10 DIAGNOSIS — Z1231 Encounter for screening mammogram for malignant neoplasm of breast: Secondary | ICD-10-CM | POA: Insufficient documentation

## 2013-03-18 ENCOUNTER — Encounter: Payer: Self-pay | Admitting: *Deleted

## 2013-03-19 ENCOUNTER — Institutional Professional Consult (permissible substitution): Payer: PRIVATE HEALTH INSURANCE | Admitting: Internal Medicine

## 2013-04-05 ENCOUNTER — Encounter (HOSPITAL_COMMUNITY): Payer: Self-pay | Admitting: Emergency Medicine

## 2013-04-05 ENCOUNTER — Emergency Department (INDEPENDENT_AMBULATORY_CARE_PROVIDER_SITE_OTHER)
Admission: EM | Admit: 2013-04-05 | Discharge: 2013-04-05 | Disposition: A | Discharge status: Home / Self Care | Payer: Medicare HMO | Source: Home / Self Care

## 2013-04-05 DIAGNOSIS — L0231 Cutaneous abscess of buttock: Secondary | ICD-10-CM

## 2013-04-05 DIAGNOSIS — L03317 Cellulitis of buttock: Secondary | ICD-10-CM

## 2013-04-05 MED ORDER — DOXYCYCLINE HYCLATE 100 MG PO CAPS: 100.0000 mg | ORAL_CAPSULE | Freq: Two times a day (BID) | ORAL | Status: DC

## 2013-04-05 NOTE — ED Provider Notes (Signed)
History     CSN: [id number]  Arrival date & time 04/05/13  1313   None     Chief Complaint  Patient presents with  . Abscess    (Consider location/radiation/quality/duration/timing/severity/associated sxs/prior treatment) Patient is a 72 y.o. female presenting with abscess. The history is provided by the patient.  Abscess Location:  Ano-genital Ano-genital abscess location:  R buttock Abscess quality: fluctuance, painful and redness   Red streaking: no   Duration:  3 days Progression:  Worsening Pain details:    Quality:  Pressure   Severity:  Moderate Chronicity:  New   Past Medical History  Diagnosis Date  . Hypertension   . Diabetes mellitus   . Stroke   . Chronic renal insufficiency   . Hyperlipidemia   . Osteopenia   . CVA (cerebral infarction)     Past Surgical History  Procedure Laterality Date  . Eye surgery      No family history on file.  History  Substance Use Topics  . Smoking status: Current Every Day Smoker -- 0.50 packs/day for 60 years    Types: Cigarettes  . Smokeless tobacco: Not on file  . Alcohol Use: No    OB History   Grav Para Term Preterm Abortions TAB SAB Ect Mult Living                  Review of Systems  Constitutional: Negative.     Allergies  Iodine and Penicillins  Home Medications   Current Outpatient Rx  Name  Route  Sig  Dispense  Refill  . aspirin EC 81 MG tablet   Oral   Take 81 mg by mouth daily.         . cyclobenzaprine (FLEXERIL) 10 MG tablet   Oral   Take 10 mg by mouth daily.         Marland Kitchen doxycycline (VIBRAMYCIN) 100 MG capsule   Oral   Take 1 capsule (100 mg total) by mouth 2 (two) times daily.   20 capsule   0   . furosemide (LASIX) 20 MG tablet   Oral   Take 20 mg by mouth 2 (two) times daily.         Marland Kitchen gabapentin (NEURONTIN) 300 MG capsule   Oral   Take 300 mg by mouth daily.         Marland Kitchen glimepiride (AMARYL) 4 MG tablet   Oral   Take 4 mg by mouth daily before breakfast.          . hydrALAZINE (APRESOLINE) 10 MG tablet   Oral   Take 10 mg by mouth 2 (two) times daily with a meal.         . hydrochlorothiazide (HYDRODIURIL) 25 MG tablet   Oral   Take 25 mg by mouth daily.         Marland Kitchen levothyroxine (SYNTHROID, LEVOTHROID) 50 MCG tablet   Oral   Take 50 mcg by mouth daily.         Marland Kitchen lisinopril (PRINIVIL,ZESTRIL) 20 MG tablet   Oral   Take 20 mg by mouth daily.         . naproxen (NAPROSYN) 500 MG tablet   Oral   Take 1 tablet (500 mg total) by mouth 2 (two) times daily with a meal.   30 tablet   0   . omeprazole (PRILOSEC) 40 MG capsule   Oral   Take 40 mg by mouth daily.         Marland Kitchen  potassium chloride SA (K-DUR,KLOR-CON) 20 MEQ tablet   Oral   Take 20 mEq by mouth 2 (two) times daily.           BP 115/80  Pulse 71  Temp(Src) 99.2 F (37.3 C) (Oral)  Resp 17  SpO2 100%  Physical Exam  Nursing note and vitals reviewed. Constitutional: She is oriented to person, place, and time. She appears well-developed and well-nourished.  Musculoskeletal: She exhibits tenderness.  Tender fluctuant right gluteal abscess   Neurological: She is alert and oriented to person, place, and time.  Skin: Skin is warm and dry.    ED Course  INCISION AND DRAINAGE Date/Time: 04/05/2013 2:28 PM Performed by: Linna Hoff Authorized by: Bradd Canary D Consent: Verbal consent obtained. Risks and benefits: risks, benefits and alternatives were discussed Consent given by: patient Type: abscess Body area: anogenital Location details: gluteal cleft Local anesthetic: topical anesthetic Patient sedated: no Scalpel size: 11 Incision type: single straight Complexity: simple Drainage: purulent Drainage amount: copious Wound treatment: drain placed Packing material: 1/4 in iodoform gauze Patient tolerance: Patient tolerated the procedure well with no immediate complications. Comments: Culture obtained.   (including critical care time)  Labs  Reviewed  CULTURE, ROUTINE-ABSCESS   No results found.   1. Abscess, gluteal, right       MDM  I+d performed        Linna Hoff, MD 04/05/13 1435

## 2013-04-05 NOTE — ED Notes (Signed)
Pt c/o abscess on right buttocks onset 3 days Sxs include: tender Denies: drainage, f/v/n/d She is alert and oriented w/no signs of acute distress.

## 2013-04-08 LAB — CULTURE, ROUTINE-ABSCESS

## 2013-04-08 NOTE — ED Notes (Signed)
Final report of CS shows multiple species; no further action required

## 2013-04-08 NOTE — ED Notes (Signed)
Chart review.

## 2013-04-09 ENCOUNTER — Emergency Department (INDEPENDENT_AMBULATORY_CARE_PROVIDER_SITE_OTHER)
Admission: EM | Admit: 2013-04-09 | Discharge: 2013-04-09 | Disposition: A | Discharge status: Home / Self Care | Payer: PRIVATE HEALTH INSURANCE | Source: Home / Self Care | Attending: Family Medicine | Admitting: Family Medicine

## 2013-04-09 ENCOUNTER — Encounter (HOSPITAL_COMMUNITY): Payer: Self-pay | Admitting: Emergency Medicine

## 2013-04-09 DIAGNOSIS — L0291 Cutaneous abscess, unspecified: Secondary | ICD-10-CM

## 2013-04-09 DIAGNOSIS — L03317 Cellulitis of buttock: Secondary | ICD-10-CM

## 2013-04-09 DIAGNOSIS — L0231 Cutaneous abscess of buttock: Secondary | ICD-10-CM

## 2013-04-09 NOTE — ED Notes (Signed)
Pt is here for follow up on boil on her inner buttock. Does not hurt anymore. She is taking antibiotics as directed. Pt is alert and oriented.

## 2013-04-09 NOTE — ED Notes (Signed)
Applied 4x4 and clear tape to open wound to cover it per patients requests.

## 2013-04-09 NOTE — ED Provider Notes (Signed)
History     CSN: [id number]  Arrival date & time 04/09/13  1253   First MD Initiated Contact with Patient 04/09/13 1425      No chief complaint on file.   (Consider location/radiation/quality/duration/timing/severity/associated sxs/prior treatment) HPI Comments: Pt presents for f/u of abscess I&D.  Reports some drainage, significant improvement in level of pain.     Past Medical History  Diagnosis Date  . Hypertension   . Diabetes mellitus   . Stroke   . Chronic renal insufficiency   . Hyperlipidemia   . Osteopenia   . CVA (cerebral infarction)     Past Surgical History  Procedure Laterality Date  . Eye surgery      No family history on file.  History  Substance Use Topics  . Smoking status: Current Every Day Smoker -- 0.50 packs/day for 60 years    Types: Cigarettes  . Smokeless tobacco: Not on file  . Alcohol Use: No    OB History   Grav Para Term Preterm Abortions TAB SAB Ect Mult Living                  Review of Systems  Constitutional: Negative for fever and chills.  Eyes: Negative for visual disturbance.  Respiratory: Negative for cough and shortness of breath.   Cardiovascular: Negative for chest pain, palpitations and leg swelling.  Gastrointestinal: Negative for nausea, vomiting and abdominal pain.  Endocrine: Negative for polydipsia and polyuria.  Genitourinary: Negative for dysuria, urgency and frequency.  Musculoskeletal: Negative for myalgias and arthralgias.  Skin: Positive for wound (abscess, drained 5 days ago ). Negative for rash.  Neurological: Negative for dizziness, weakness and light-headedness.    Allergies  Iodine and Penicillins  Home Medications   Current Outpatient Rx  Name  Route  Sig  Dispense  Refill  . aspirin EC 81 MG tablet   Oral   Take 81 mg by mouth daily.         . cyclobenzaprine (FLEXERIL) 10 MG tablet   Oral   Take 10 mg by mouth daily.         Marland Kitchen doxycycline (VIBRAMYCIN) 100 MG capsule   Oral  Take 1 capsule (100 mg total) by mouth 2 (two) times daily.   20 capsule   0   . furosemide (LASIX) 20 MG tablet   Oral   Take 20 mg by mouth 2 (two) times daily.         Marland Kitchen gabapentin (NEURONTIN) 300 MG capsule   Oral   Take 300 mg by mouth daily.         Marland Kitchen glimepiride (AMARYL) 4 MG tablet   Oral   Take 4 mg by mouth daily before breakfast.         . hydrALAZINE (APRESOLINE) 10 MG tablet   Oral   Take 10 mg by mouth 2 (two) times daily with a meal.         . hydrochlorothiazide (HYDRODIURIL) 25 MG tablet   Oral   Take 25 mg by mouth daily.         Marland Kitchen levothyroxine (SYNTHROID, LEVOTHROID) 50 MCG tablet   Oral   Take 50 mcg by mouth daily.         Marland Kitchen lisinopril (PRINIVIL,ZESTRIL) 20 MG tablet   Oral   Take 20 mg by mouth daily.         . naproxen (NAPROSYN) 500 MG tablet   Oral   Take 1 tablet (500 mg total) by  mouth 2 (two) times daily with a meal.   30 tablet   0   . omeprazole (PRILOSEC) 40 MG capsule   Oral   Take 40 mg by mouth daily.         . potassium chloride SA (K-DUR,KLOR-CON) 20 MEQ tablet   Oral   Take 20 mEq by mouth 2 (two) times daily.           BP 153/72  Pulse 74  Temp(Src) 97.9 F (36.6 C) (Oral)  Resp 16  SpO2 100%  Physical Exam  Nursing note and vitals reviewed. Constitutional: She is oriented to person, place, and time. Vital signs are normal. She appears well-developed and well-nourished. No distress.  HENT:  Head: Atraumatic.  Eyes: EOM are normal. Pupils are equal, round, and reactive to light.  Cardiovascular: Normal rate, regular rhythm and normal heart sounds.  Exam reveals no gallop and no friction rub.   No murmur heard. Pulmonary/Chest: Effort normal and breath sounds normal. No respiratory distress. She has no wheezes. She has no rales.  Abdominal: Soft. There is no tenderness.  Neurological: She is alert and oriented to person, place, and time. She has normal strength.  Skin: Skin is warm and dry. She is  not diaphoretic.  Right glute gluteal cleft incision, draining, open.  Minimal induration   Psychiatric: She has a normal mood and affect. Her behavior is normal. Judgment normal.    ED Course  Procedures (including critical care time)  Labs Reviewed - No data to display No results found.   1. Simple abscess       MDM  Advised pt to used warm soaks at least TID.  This is healing appropriately, will not re-pack at this time.  Continue ABx.  F/U again in 5 days for check         Graylon Good, PA-C 04/09/13 1444

## 2013-04-21 NOTE — ED Provider Notes (Signed)
Medical screening examination/treatment/procedure(s) were performed by resident physician or non-physician practitioner and as supervising physician I was immediately available for consultation/collaboration.   Simrah Chatham DOUGLAS MD.   Oaklee Esther D Illana Nolting, MD 04/21/13 2024 

## 2013-05-12 ENCOUNTER — Encounter: Payer: Self-pay | Admitting: Vascular Surgery

## 2013-05-13 ENCOUNTER — Encounter: Payer: Self-pay | Admitting: Vascular Surgery

## 2013-05-13 ENCOUNTER — Ambulatory Visit (INDEPENDENT_AMBULATORY_CARE_PROVIDER_SITE_OTHER): Payer: PRIVATE HEALTH INSURANCE | Admitting: Vascular Surgery

## 2013-05-13 VITALS — BP 194/79 | HR 73 | Ht 65.0 in | Wt 194.0 lb

## 2013-05-13 DIAGNOSIS — I739 Peripheral vascular disease, unspecified: Secondary | ICD-10-CM

## 2013-05-13 DIAGNOSIS — M79609 Pain in unspecified limb: Secondary | ICD-10-CM

## 2013-05-13 NOTE — Progress Notes (Signed)
Subjective:     Patient ID: [name] [name], female   DOB: 05/14/1941, 72 y.o.   MRN: [id number]  HPI this 72 year old female was referred for evaluation of pain in the lower extremities and swelling. She has had swelling for many years she states which is fairly equal in right and left legs. 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 She complains of heaviness in the left arm and leg. She has no history of nonhealing ulcers, gangrene, or infection in the lower extremities. She does not complaining varicose veins. She has no history of DVT or thrombophlebitis.  Past Medical History  Diagnosis Date  . Hypertension   . Diabetes mellitus   . Stroke   . Chronic renal insufficiency   . Hyperlipidemia   . Osteopenia   . CVA (cerebral infarction)     History  Substance Use Topics  . Smoking status: Current Every Day Smoker -- 0.50 packs/day for 60 years    Types: Cigarettes  . Smokeless tobacco: Not on file     Comment: pt states "sometimes I quit smoking for about 2 weeks then I start back"  . Alcohol Use: Yes     Comment: occasionally    Family History  Problem Relation Age of Onset  . Diabetes Mother   . Hypertension Mother     Allergies  Allergen Reactions  . Erythromycin   . Iodine     REACTION: hives  . Penicillins     REACTION: hives    Current outpatient prescriptions:aspirin EC 81 MG tablet, Take 81 mg by mouth daily., Disp: , Rfl: ;  Cyanocobalamin (VITAMIN B 12 PO), Take 1,000 mcg by mouth daily., Disp: , Rfl: ;  cyclobenzaprine (FLEXERIL) 10 MG tablet, Take 10 mg by mouth daily., Disp: , Rfl: ;  doxycycline (VIBRAMYCIN) 100 MG capsule, Take 1 capsule (100 mg total) by mouth 2 (two) times daily., Disp: 20 capsule, Rfl: 0 furosemide (LASIX) 20 MG tablet, Take 20 mg by mouth 2 (two) times daily., Disp: , Rfl: ;  gabapentin (NEURONTIN) 300 MG capsule, Take 300 mg by mouth daily., Disp: , Rfl: ;  glimepiride (AMARYL) 4 MG  tablet, Take 4 mg by mouth daily before breakfast., Disp: , Rfl: ;  hydrALAZINE (APRESOLINE) 10 MG tablet, Take 10 mg by mouth 2 (two) times daily with a meal., Disp: , Rfl:  hydrochlorothiazide (HYDRODIURIL) 25 MG tablet, Take 25 mg by mouth daily., Disp: , Rfl: ;  levothyroxine (SYNTHROID, LEVOTHROID) 50 MCG tablet, Take 50 mcg by mouth daily., Disp: , Rfl: ;  lisinopril (PRINIVIL,ZESTRIL) 20 MG tablet, Take 20 mg by mouth daily., Disp: , Rfl: ;  metFORMIN (GLUCOPHAGE) 1000 MG tablet, Take 1,000 mg by mouth 2 (two) times daily with a meal., Disp: , Rfl:  metoprolol tartrate (LOPRESSOR) 25 MG tablet, Take 25 mg by mouth 2 (two) times daily., Disp: , Rfl: ;  naproxen (NAPROSYN) 500 MG tablet, Take 1 tablet by mouth 2 (two) times daily., Disp: , Rfl: ;  omeprazole (PRILOSEC) 40 MG capsule, Take 40 mg by mouth daily., Disp: , Rfl: ;  potassium chloride SA (K-DUR,KLOR-CON) 20 MEQ tablet, Take 20 mEq by mouth 2 (two) times daily., Disp: , Rfl:   BP 194/79  Pulse 73  Ht 5\' 5"  (1.651 m)  Wt 194 lb (87.998 kg)  BMI 32.28 kg/m2  SpO2 100%  Body mass index is 32.28 kg/(m^2).  Review of Systems complains of orthopnea, dyspnea on exertion, leg pain with walking, pain in feet while lying in bed, wheezing, dizziness, fever, chills. Other systems negative complete review of systems    Objective:   Physical Exam blood pressure 194/79 heart rate 73 respirations 18 Gen.-alert and oriented x3 in no apparent distress-elderly-frail HEENT normal for age Lungs no rhonchi or wheezing Cardiovascular regular rhythm no murmurs carotid pulses 3+ palpable no bruits audible Abdomen soft nontender no palpable masses Musculoskeletal free of  major deformities Skin clear -no rashes Neurologic normal Lower extremities 3+ femoral and 1-2+ dorsalis pedis pulses palpable bilaterally with 1+ symmetrical edema bilaterally.  Today I reviewed the lower extremity arterial Doppler report performed by [facility] which revealed an ABI of 0.75 on the right and 1.1 on the left      Assessment:     Bilateral symmetrical edema and leg discomfort left greater than right    Plan:    no evidence of significant arterial insufficiency which is limb threatening and symmetrical bilateral edema which is chronic #1 elevate the foot of the bed 2 inches #2 short leg elastic compression stockings would be helpful but I do not think patient can put these on No further evaluation or treatment of arterial or venous system is indicated

## 2013-05-22 ENCOUNTER — Ambulatory Visit: Payer: PRIVATE HEALTH INSURANCE | Attending: Ophthalmology | Admitting: Occupational Therapy

## 2013-05-22 DIAGNOSIS — E11319 Type 2 diabetes mellitus with unspecified diabetic retinopathy without macular edema: Secondary | ICD-10-CM | POA: Insufficient documentation

## 2013-05-22 DIAGNOSIS — IMO0001 Reserved for inherently not codable concepts without codable children: Secondary | ICD-10-CM | POA: Insufficient documentation

## 2013-06-25 ENCOUNTER — Other Ambulatory Visit: Payer: Self-pay | Admitting: Family

## 2013-06-25 ENCOUNTER — Ambulatory Visit
Admission: RE | Admit: 2013-06-25 | Discharge: 2013-06-25 | Disposition: A | Discharge status: Home / Self Care | Payer: PRIVATE HEALTH INSURANCE | Source: Ambulatory Visit | Attending: Family | Admitting: Family

## 2013-06-25 DIAGNOSIS — M79604 Pain in right leg: Secondary | ICD-10-CM

## 2013-06-26 ENCOUNTER — Other Ambulatory Visit: Payer: Self-pay | Admitting: Family

## 2013-06-26 ENCOUNTER — Ambulatory Visit
Admission: RE | Admit: 2013-06-26 | Discharge: 2013-06-26 | Disposition: A | Discharge status: Home / Self Care | Payer: PRIVATE HEALTH INSURANCE | Source: Ambulatory Visit | Attending: Family | Admitting: Family

## 2013-06-26 DIAGNOSIS — M79604 Pain in right leg: Secondary | ICD-10-CM

## 2013-06-29 ENCOUNTER — Emergency Department (HOSPITAL_COMMUNITY): Payer: PRIVATE HEALTH INSURANCE

## 2013-06-29 ENCOUNTER — Emergency Department (HOSPITAL_COMMUNITY)
Admission: EM | Admit: 2013-06-29 | Discharge: 2013-06-30 | Disposition: A | Discharge status: Home / Self Care | Payer: PRIVATE HEALTH INSURANCE | Attending: Emergency Medicine | Admitting: Emergency Medicine

## 2013-06-29 ENCOUNTER — Encounter (HOSPITAL_COMMUNITY): Payer: Self-pay

## 2013-06-29 DIAGNOSIS — Z7982 Long term (current) use of aspirin: Secondary | ICD-10-CM | POA: Insufficient documentation

## 2013-06-29 DIAGNOSIS — Z791 Long term (current) use of non-steroidal anti-inflammatories (NSAID): Secondary | ICD-10-CM | POA: Insufficient documentation

## 2013-06-29 DIAGNOSIS — M899 Disorder of bone, unspecified: Secondary | ICD-10-CM | POA: Insufficient documentation

## 2013-06-29 DIAGNOSIS — N189 Chronic kidney disease, unspecified: Secondary | ICD-10-CM | POA: Insufficient documentation

## 2013-06-29 DIAGNOSIS — Z79899 Other long term (current) drug therapy: Secondary | ICD-10-CM | POA: Insufficient documentation

## 2013-06-29 DIAGNOSIS — Z794 Long term (current) use of insulin: Secondary | ICD-10-CM | POA: Insufficient documentation

## 2013-06-29 DIAGNOSIS — D72829 Elevated white blood cell count, unspecified: Secondary | ICD-10-CM | POA: Insufficient documentation

## 2013-06-29 DIAGNOSIS — E1169 Type 2 diabetes mellitus with other specified complication: Secondary | ICD-10-CM | POA: Insufficient documentation

## 2013-06-29 DIAGNOSIS — I129 Hypertensive chronic kidney disease with stage 1 through stage 4 chronic kidney disease, or unspecified chronic kidney disease: Secondary | ICD-10-CM | POA: Insufficient documentation

## 2013-06-29 DIAGNOSIS — Z880 Allergy status to penicillin: Secondary | ICD-10-CM | POA: Insufficient documentation

## 2013-06-29 DIAGNOSIS — F17200 Nicotine dependence, unspecified, uncomplicated: Secondary | ICD-10-CM | POA: Insufficient documentation

## 2013-06-29 DIAGNOSIS — Z8673 Personal history of transient ischemic attack (TIA), and cerebral infarction without residual deficits: Secondary | ICD-10-CM | POA: Insufficient documentation

## 2013-06-29 DIAGNOSIS — E162 Hypoglycemia, unspecified: Secondary | ICD-10-CM

## 2013-06-29 DIAGNOSIS — E785 Hyperlipidemia, unspecified: Secondary | ICD-10-CM | POA: Insufficient documentation

## 2013-06-29 LAB — CBC
MCH: 29.5 pg (ref 26.0–34.0)
MCHC: 33.2 g/dL (ref 30.0–36.0)
MCV: 88.6 fL (ref 78.0–100.0)
Platelets: 248 10*3/uL (ref 150–400)
RBC: 3.87 MIL/uL (ref 3.87–5.11)
RDW: 12.6 % (ref 11.5–15.5)

## 2013-06-29 LAB — URINALYSIS, ROUTINE W REFLEX MICROSCOPIC
Ketones, ur: NEGATIVE mg/dL
Leukocytes, UA: NEGATIVE
Protein, ur: NEGATIVE mg/dL
Urobilinogen, UA: 0.2 mg/dL (ref 0.0–1.0)

## 2013-06-29 LAB — BASIC METABOLIC PANEL
Calcium: 9 mg/dL (ref 8.4–10.5)
Creatinine, Ser: 1.43 mg/dL — ABNORMAL HIGH (ref 0.50–1.10)
GFR calc Af Amer: 41 mL/min — ABNORMAL LOW (ref >90)
GFR calc non Af Amer: 35 mL/min — ABNORMAL LOW (ref >90)
Sodium: 140 mEq/L (ref 135–145)

## 2013-06-29 NOTE — ED Provider Notes (Signed)
CSN: [id number]     Arrival date & time 06/29/13  2135 History     First MD Initiated Contact with Patient 06/29/13 2151     Chief Complaint  Patient presents with  . Hypoglycemia    [id number]   (Consider location/radiation/quality/duration/timing/severity/associated sxs/prior Treatment) Patient is a 72 y.o. female presenting with hypoglycemia. The history is provided by the patient and medical records. No language interpreter was used.  Hypoglycemia Associated symptoms: no anxiety, no shortness of breath, no sweats and no vomiting     [name] [name] is a 72 y.o. female  with a hx of hypertension, diabetes, stroke (greater than 20 years ago with residual left-sided weakness), hyperlipidemia, osteopenia presents to the Emergency Department complaining of gradual, rapidly worsening episode of hypoglycemia beginning approximately one hour prior to arrival. Patient states she checked her blood sugar before going to bed than it was in the 90s which she felt was low therefore she ate half a cup of fruit. Patient's husband at bedside states approximately 30 minutes later she became diaphoretic and was shaking therefore he called EMS. EMS arrived the patient's blood sugar to be 30 but she appeared alert.  They attempted oral glucose and have the patient eat without improvement in blood sugar.  They were unable to obtain an IV therefore they gave glucagon 1 g IM. Patient remembers awakening in the ambulance.  Patient states it has been many years since her blood sugar dropped low. Pt denies fever, chills, headache, neck pain, chest pain, shortness of breath, abdominal pain, nausea, vomiting, diarrhea, constipation, weakness, dizziness, syncope, dysuria, hematuria.      Past Medical History  Diagnosis Date  . Hypertension   . Diabetes mellitus   . Stroke   . Chronic renal insufficiency   . Hyperlipidemia   . Osteopenia   . CVA (cerebral infarction)    Past Surgical History  Procedure Laterality Date   . Eye surgery     Family History  Problem Relation Age of Onset  . Diabetes Mother   . Hypertension Mother    History  Substance Use Topics  . Smoking status: Current Every Day Smoker -- 0.50 packs/day for 60 years    Types: Cigarettes  . Smokeless tobacco: Not on file     Comment: pt states "sometimes I quit smoking for about 2 weeks then I start back"  . Alcohol Use: Yes     Comment: occasionally   OB History   Grav Para Term Preterm Abortions TAB SAB Ect Mult Living                 Review of Systems  Constitutional: Negative for fever, diaphoresis, appetite change, fatigue and unexpected weight change.  HENT: Negative for mouth sores and neck stiffness.   Eyes: Negative for visual disturbance.  Respiratory: Negative for cough, chest tightness, shortness of breath and wheezing.   Cardiovascular: Negative for chest pain.  Gastrointestinal: Negative for nausea, vomiting, abdominal pain, diarrhea and constipation.  Endocrine: Negative for polydipsia, polyphagia and polyuria.  Genitourinary: Negative for dysuria, urgency, frequency and hematuria.  Musculoskeletal: Negative for back pain.  Skin: Negative for rash.  Allergic/Immunologic: Negative for immunocompromised state.  Neurological: Negative for syncope, light-headedness and headaches.  Hematological: Does not bruise/bleed easily.  Psychiatric/Behavioral: Negative for sleep disturbance. The patient is not nervous/anxious.     Allergies  Erythromycin; Iodine; and Penicillins  Home Medications   Current Outpatient Rx  Name  Route  Sig  Dispense  Refill  .  aspirin EC 81 MG tablet   Oral   Take 81 mg by mouth daily.         . Cyanocobalamin (VITAMIN B 12 PO)   Oral   Take 1,000 mcg by mouth daily.         . cyclobenzaprine (FLEXERIL) 10 MG tablet   Oral   Take 10 mg by mouth daily.         . furosemide (LASIX) 20 MG tablet   Oral   Take 20 mg by mouth 2 (two) times daily.         Marland Kitchen gabapentin  (NEURONTIN) 300 MG capsule   Oral   Take 300 mg by mouth daily.         Marland Kitchen glimepiride (AMARYL) 4 MG tablet   Oral   Take 4 mg by mouth daily before breakfast.         . hydrALAZINE (APRESOLINE) 10 MG tablet   Oral   Take 10 mg by mouth 2 (two) times daily with a meal.         . hydrochlorothiazide (HYDRODIURIL) 25 MG tablet   Oral   Take 25 mg by mouth daily.         . insulin NPH-regular (NOVOLIN 70/30) (70-30) 100 UNIT/ML injection   Subcutaneous   Inject 40 Units into the skin daily as needed. Blood sugar >200 take insulin         . levothyroxine (SYNTHROID, LEVOTHROID) 50 MCG tablet   Oral   Take 50 mcg by mouth daily.         Marland Kitchen lisinopril (PRINIVIL,ZESTRIL) 20 MG tablet   Oral   Take 20 mg by mouth daily.         . metFORMIN (GLUCOPHAGE) 1000 MG tablet   Oral   Take 1,000 mg by mouth 2 (two) times daily with a meal.         . metoprolol tartrate (LOPRESSOR) 25 MG tablet   Oral   Take 25 mg by mouth 2 (two) times daily.         . naproxen (NAPROSYN) 500 MG tablet   Oral   Take 1 tablet by mouth 2 (two) times daily.         Marland Kitchen omeprazole (PRILOSEC) 40 MG capsule   Oral   Take 40 mg by mouth daily.         . potassium chloride SA (K-DUR,KLOR-CON) 20 MEQ tablet   Oral   Take 20 mEq by mouth 2 (two) times daily.          BP 182/72  Pulse 76  Temp(Src) 97.7 F (36.5 C) (Oral)  SpO2 100% Physical Exam  Nursing note and vitals reviewed. Constitutional: She is oriented to person, place, and time. She appears well-developed and well-nourished. No distress.  Awake, alert, nontoxic appearance  HENT:  Head: Normocephalic and atraumatic.  Mouth/Throat: Oropharynx is clear and moist. No oropharyngeal exudate.  Eyes: Conjunctivae are normal. Pupils are equal, round, and reactive to light. No scleral icterus.  Neck: Normal range of motion. Neck supple.  Cardiovascular: Normal rate, regular rhythm, normal heart sounds and intact distal pulses.    No murmur heard. Pulmonary/Chest: Effort normal and breath sounds normal. No respiratory distress. She has no wheezes.  Abdominal: Soft. Bowel sounds are normal. She exhibits no distension. There is no tenderness. There is no rebound.  Musculoskeletal: Normal range of motion. She exhibits no edema.  Lymphadenopathy:    She has no cervical adenopathy.  Neurological: She is alert and oriented to person, place, and time. She exhibits normal muscle tone. Coordination normal.  Speech is clear and goal oriented, follows commands Major Cranial nerves without deficit, no facial droop Normal strength in upper and lower extremities bilaterally including dorsiflexion and plantar flexion, strong and equal grip strength Sensation normal to light and sharp touch Moves extremities without ataxia, coordination intact Normal finger to nose and rapid alternating movements Neg romberg, no pronator drift  Skin: Skin is warm and dry. No rash noted. She is not diaphoretic. No erythema.  Psychiatric: She has a normal mood and affect.    ED Course   Procedures (including critical care time)  Labs Reviewed  GLUCOSE, CAPILLARY - Abnormal; Notable for the following:    Glucose-Capillary 147 (*)    All other components within normal limits  CBC - Abnormal; Notable for the following:    WBC 15.7 (*)    Hemoglobin 11.4 (*)    HCT 34.3 (*)    All other components within normal limits  BASIC METABOLIC PANEL - Abnormal; Notable for the following:    Potassium 3.0 (*)    CO2 33 (*)    Glucose, Bld 172 (*)    Creatinine, Ser 1.43 (*)    GFR calc non Af Amer 35 (*)    GFR calc Af Amer 41 (*)    All other components within normal limits  GLUCOSE, CAPILLARY - Abnormal; Notable for the following:    Glucose-Capillary 192 (*)    All other components within normal limits  URINALYSIS, ROUTINE W REFLEX MICROSCOPIC   Dg Chest 2 View  06/29/2013   *RADIOLOGY REPORT*  Clinical Data: Shortness of breath.  CHEST - 2  VIEW  Comparison: 02/05/2013 and 08/13/2011  Findings: Lungs are adequately inflated without focal consolidation or effusion.  Cardiomediastinal silhouette and remainder of the exam is unchanged.  IMPRESSION: No acute cardiopulmonary disease.   Original Report Authenticated By: Elberta Fortis, M.D.   1. Hypoglycemia   2. Leukocytosis     MDM  [name] presents after episode of hypoglycemia. Patient taking glipizide and metformin.  Denies fevers, chills or other subjective symptoms.    1:01 AM No evidence of UTI, CXR without evidence of pneumonia, BMP with mild hypokalemia and elevated serum creatinine to baseline. Nonspecific leukocytosis noted.  Pt has eaten here in the department and has maintained an adequate CBG with without ALOC.  She has remained alert and oriented, nontoxic and nonseptic appearing.  Pt agrees to follow-up with PCP tomorrow or early this week.  I have also discussed reasons to return immediately to the ER.  Patient expresses understanding and agrees with plan.    Dahlia Client Prisha Hiley, PA-C 06/30/13 0109

## 2013-06-29 NOTE — ED Notes (Signed)
Bed: WA04 Expected date:  Expected time:  Means of arrival:  Comments: EMS 

## 2013-06-29 NOTE — ED Notes (Signed)
MD at bedside. 

## 2013-06-29 NOTE — ED Notes (Signed)
Per [facility]- on scene 2100 CBG 30 pt stuporous yet alert oral glucose given. CBG 49 Neuro and motor function improved. Pt able to follow commands. Pt able to sips of soda thru straw. Tolerate oral intake of food. CBG decreased to 32. IV attempt not successful. Oral glucagon 1 IM at 2120 CBG 63 with improved mentation.

## 2013-06-30 LAB — GLUCOSE, CAPILLARY: Glucose-Capillary: 192 mg/dL — ABNORMAL HIGH (ref 70–99)

## 2013-06-30 NOTE — ED Notes (Signed)
Pt discharged with bp 192/71. Pt took BP meds yesterday. No symptoms. Pam RN presents to witness pts status. EDP Jeraldine Loots updated with pt current status and states ok with to go home.  Pt to follow up with MD tomorrow

## 2013-07-01 NOTE — ED Provider Notes (Signed)
Medical screening examination/treatment/procedure(s) were performed by non-physician practitioner and as supervising physician I was immediately available for consultation/collaboration.   Nyheem Binette M Latiffany Harwick, MD 07/01/13 2255 

## 2013-08-19 ENCOUNTER — Encounter (HOSPITAL_COMMUNITY): Payer: Self-pay | Admitting: Emergency Medicine

## 2013-08-19 ENCOUNTER — Emergency Department (INDEPENDENT_AMBULATORY_CARE_PROVIDER_SITE_OTHER)
Admission: EM | Admit: 2013-08-19 | Discharge: 2013-08-19 | Disposition: A | Discharge status: Home / Self Care | Payer: PRIVATE HEALTH INSURANCE | Source: Home / Self Care | Attending: Family Medicine | Admitting: Family Medicine

## 2013-08-19 DIAGNOSIS — L02219 Cutaneous abscess of trunk, unspecified: Secondary | ICD-10-CM

## 2013-08-19 DIAGNOSIS — L02213 Cutaneous abscess of chest wall: Secondary | ICD-10-CM

## 2013-08-19 NOTE — ED Provider Notes (Signed)
CSN: [id number]     Arrival date & time 08/19/13  1516 History   First MD Initiated Contact with Patient 08/19/13 1608     Chief Complaint  Patient presents with  . Cyst   (Consider location/radiation/quality/duration/timing/severity/associated sxs/prior Treatment) Patient is a 72 y.o. female presenting with abscess. The history is provided by the patient.  Abscess Location:  Torso Torso abscess location:  R chest Abscess quality: fluctuance, painful, redness and warmth   Red streaking: no   Duration:  3 days Progression:  Worsening Pain details:    Quality:  Sharp Chronicity:  New Context: insect bite/sting   Associated symptoms: no fever   Risk factors: no hx of MRSA and no prior abscess     Past Medical History  Diagnosis Date  . Hypertension   . Diabetes mellitus   . Stroke   . Chronic renal insufficiency   . Hyperlipidemia   . Osteopenia   . CVA (cerebral infarction)    Past Surgical History  Procedure Laterality Date  . Eye surgery     Family History  Problem Relation Age of Onset  . Diabetes Mother   . Hypertension Mother    History  Substance Use Topics  . Smoking status: Current Every Day Smoker -- 0.50 packs/day for 60 years    Types: Cigarettes  . Smokeless tobacco: Not on file     Comment: pt states "sometimes I quit smoking for about 2 weeks then I start back"  . Alcohol Use: Yes     Comment: occasionally   OB History   Grav Para Term Preterm Abortions TAB SAB Ect Mult Living                 Review of Systems  Constitutional: Negative.  Negative for fever.  Skin: Positive for rash.    Allergies  Erythromycin; Iodine; and Penicillins  Home Medications   Current Outpatient Rx  Name  Route  Sig  Dispense  Refill  . aspirin EC 81 MG tablet   Oral   Take 81 mg by mouth daily.         . Cyanocobalamin (VITAMIN B 12 PO)   Oral   Take 1,000 mcg by mouth daily.         . cyclobenzaprine (FLEXERIL) 10 MG tablet   Oral   Take 10 mg  by mouth daily.         . furosemide (LASIX) 20 MG tablet   Oral   Take 20 mg by mouth 2 (two) times daily.         Marland Kitchen gabapentin (NEURONTIN) 300 MG capsule   Oral   Take 300 mg by mouth daily.         Marland Kitchen glimepiride (AMARYL) 4 MG tablet   Oral   Take 4 mg by mouth daily before breakfast.         . hydrALAZINE (APRESOLINE) 10 MG tablet   Oral   Take 10 mg by mouth 2 (two) times daily with a meal.         . hydrochlorothiazide (HYDRODIURIL) 25 MG tablet   Oral   Take 25 mg by mouth daily.         . insulin NPH-regular (NOVOLIN 70/30) (70-30) 100 UNIT/ML injection   Subcutaneous   Inject 40 Units into the skin daily as needed. Blood sugar >200 take insulin         . levothyroxine (SYNTHROID, LEVOTHROID) 50 MCG tablet   Oral   Take  50 mcg by mouth daily.         Marland Kitchen lisinopril (PRINIVIL,ZESTRIL) 20 MG tablet   Oral   Take 20 mg by mouth daily.         . metFORMIN (GLUCOPHAGE) 1000 MG tablet   Oral   Take 1,000 mg by mouth 2 (two) times daily with a meal.         . metoprolol tartrate (LOPRESSOR) 25 MG tablet   Oral   Take 25 mg by mouth 2 (two) times daily.         . naproxen (NAPROSYN) 500 MG tablet   Oral   Take 1 tablet by mouth 2 (two) times daily.         Marland Kitchen omeprazole (PRILOSEC) 40 MG capsule   Oral   Take 40 mg by mouth daily.         . potassium chloride SA (K-DUR,KLOR-CON) 20 MEQ tablet   Oral   Take 20 mEq by mouth 2 (two) times daily.          BP 179/49  Pulse 73  Temp(Src) 97.9 F (36.6 C) (Oral)  Resp 16  SpO2 98% Physical Exam  Nursing note and vitals reviewed. Constitutional: She is oriented to person, place, and time. She appears well-developed and well-nourished.  Neurological: She is alert and oriented to person, place, and time.  Skin: Skin is warm and dry.  Tender fluctuant lesion right medial breast approx 1cm.    ED Course  INCISION AND DRAINAGE Date/Time: 08/19/2013 4:22 PM Performed by: Linna Hoff Authorized by: Bradd Canary D Consent: Verbal consent obtained. Risks and benefits: risks, benefits and alternatives were discussed Consent given by: patient Type: abscess Body area: trunk Location details: right breast Local anesthetic: topical anesthetic Patient sedated: no Scalpel size: 11 Incision type: single straight Complexity: simple Drainage: purulent Drainage amount: moderate Wound treatment: wound left open Packing material: none Patient tolerance: Patient tolerated the procedure well with no immediate complications. Comments: Culture obtained.   (including critical care time) Labs Review Labs Reviewed  CULTURE, ROUTINE-ABSCESS   Imaging Review No results found.  EKG Interpretation     Ventricular Rate:    PR Interval:    QRS Duration:   QT Interval:    QTC Calculation:   R Axis:     Text Interpretation:              MDM  I+d performed    Linna Hoff, MD 08/19/13 1627

## 2013-08-19 NOTE — ED Notes (Signed)
discharged by [facility]

## 2013-08-19 NOTE — ED Notes (Signed)
At bedside for examination and opening of bump, culture labeled by this nurse and taken to lab.

## 2013-08-19 NOTE — ED Notes (Signed)
Knot under right breast, noticed one week ago.

## 2013-08-22 ENCOUNTER — Ambulatory Visit: Payer: PRIVATE HEALTH INSURANCE | Admitting: Dietician

## 2013-08-23 LAB — CULTURE, ROUTINE-ABSCESS

## 2013-10-31 ENCOUNTER — Encounter (HOSPITAL_COMMUNITY): Payer: Self-pay | Admitting: Emergency Medicine

## 2013-10-31 ENCOUNTER — Emergency Department (HOSPITAL_COMMUNITY): Payer: Medicare HMO

## 2013-10-31 ENCOUNTER — Emergency Department (HOSPITAL_COMMUNITY)
Admission: EM | Admit: 2013-10-31 | Discharge: 2013-10-31 | Disposition: A | Discharge status: Home / Self Care | Payer: Medicare HMO | Attending: Emergency Medicine | Admitting: Emergency Medicine

## 2013-10-31 DIAGNOSIS — Z87448 Personal history of other diseases of urinary system: Secondary | ICD-10-CM | POA: Insufficient documentation

## 2013-10-31 DIAGNOSIS — I10 Essential (primary) hypertension: Secondary | ICD-10-CM | POA: Insufficient documentation

## 2013-10-31 DIAGNOSIS — E119 Type 2 diabetes mellitus without complications: Secondary | ICD-10-CM | POA: Insufficient documentation

## 2013-10-31 DIAGNOSIS — Z794 Long term (current) use of insulin: Secondary | ICD-10-CM | POA: Insufficient documentation

## 2013-10-31 DIAGNOSIS — Z8739 Personal history of other diseases of the musculoskeletal system and connective tissue: Secondary | ICD-10-CM | POA: Insufficient documentation

## 2013-10-31 DIAGNOSIS — Z791 Long term (current) use of non-steroidal anti-inflammatories (NSAID): Secondary | ICD-10-CM | POA: Insufficient documentation

## 2013-10-31 DIAGNOSIS — Z79899 Other long term (current) drug therapy: Secondary | ICD-10-CM | POA: Insufficient documentation

## 2013-10-31 DIAGNOSIS — F17200 Nicotine dependence, unspecified, uncomplicated: Secondary | ICD-10-CM | POA: Insufficient documentation

## 2013-10-31 DIAGNOSIS — Z880 Allergy status to penicillin: Secondary | ICD-10-CM | POA: Insufficient documentation

## 2013-10-31 DIAGNOSIS — Z8673 Personal history of transient ischemic attack (TIA), and cerebral infarction without residual deficits: Secondary | ICD-10-CM | POA: Insufficient documentation

## 2013-10-31 DIAGNOSIS — E669 Obesity, unspecified: Secondary | ICD-10-CM | POA: Insufficient documentation

## 2013-10-31 DIAGNOSIS — Z7982 Long term (current) use of aspirin: Secondary | ICD-10-CM | POA: Insufficient documentation

## 2013-10-31 LAB — POCT I-STAT, CHEM 8
BUN: 12 mg/dL (ref 6–23)
Calcium, Ion: 1.21 mmol/L (ref 1.13–1.30)
Chloride: 104 mEq/L (ref 96–112)
Creatinine, Ser: 1.1 mg/dL (ref 0.50–1.10)
Hemoglobin: 12.2 g/dL (ref 12.0–15.0)
Potassium: 4.7 mEq/L (ref 3.5–5.1)
Sodium: 142 mEq/L (ref 135–145)
TCO2: 28 mmol/L (ref 0–100)

## 2013-10-31 LAB — CBC WITH DIFFERENTIAL/PLATELET
Basophils Absolute: 0 10*3/uL (ref 0.0–0.1)
Eosinophils Relative: 2 % (ref 0–5)
HCT: 34.8 % — ABNORMAL LOW (ref 36.0–46.0)
Lymphocytes Relative: 40 % (ref 12–46)
Lymphs Abs: 2.5 10*3/uL (ref 0.7–4.0)
MCV: 87.7 fL (ref 78.0–100.0)
Monocytes Absolute: 0.5 10*3/uL (ref 0.1–1.0)
Neutro Abs: 3.2 10*3/uL (ref 1.7–7.7)
Platelets: 233 10*3/uL (ref 150–400)
RBC: 3.97 MIL/uL (ref 3.87–5.11)
RDW: 12.5 % (ref 11.5–15.5)
WBC: 6.3 10*3/uL (ref 4.0–10.5)

## 2013-10-31 LAB — POCT I-STAT TROPONIN I: Troponin i, poc: 0.02 ng/mL (ref 0.00–0.08)

## 2013-10-31 MED ORDER — LEVOFLOXACIN 500 MG PO TABS: 500.0000 mg | ORAL_TABLET | Freq: Every day | ORAL | Status: DC

## 2013-10-31 MED ORDER — PREDNISONE 10 MG PO TABS: ORAL_TABLET | ORAL | Status: DC

## 2013-10-31 MED ORDER — HYDRALAZINE HCL 10 MG PO TABS: 10.0000 mg | ORAL_TABLET | Freq: Four times a day (QID) | ORAL | Status: DC

## 2013-10-31 NOTE — ED Provider Notes (Signed)
CSN: [id number]     Arrival date & time 10/31/13  1546 History   First MD Initiated Contact with Patient 10/31/13 1709     Chief Complaint  Patient presents with  . Hypertension   (Consider location/radiation/quality/duration/timing/severity/associated sxs/prior Treatment) HPI Comments: ADRIONNA DELCID is a 72 y.o. female who is here for evaluation of elevated blood pressure. She states that she is taking her usual medications at home, but has had blood pressure measurements in the range of 220/90, for several days. She denies headache, paresthesias, weakness, chest pain, shortness of breath, nausea, vomiting, or change in bowel and urinary habits. There no other known modifying factors.   Patient is a 72 y.o. female presenting with hypertension. The history is provided by the patient.  Hypertension    Past Medical History  Diagnosis Date  . Hypertension   . Diabetes mellitus   . Stroke   . Chronic renal insufficiency   . Hyperlipidemia   . Osteopenia   . CVA (cerebral infarction)    Past Surgical History  Procedure Laterality Date  . Eye surgery     Family History  Problem Relation Age of Onset  . Diabetes Mother   . Hypertension Mother    History  Substance Use Topics  . Smoking status: Current Every Day Smoker -- 0.50 packs/day for 60 years    Types: Cigarettes  . Smokeless tobacco: Not on file     Comment: pt states "sometimes I quit smoking for about 2 weeks then I start back"  . Alcohol Use: Yes     Comment: occasionally   OB History   Grav Para Term Preterm Abortions TAB SAB Ect Mult Living                 Review of Systems  All other systems reviewed and are negative.    Allergies  Erythromycin; Iodine; and Penicillins  Home Medications   Current Outpatient Rx  Name  Route  Sig  Dispense  Refill  . aspirin EC 81 MG tablet   Oral   Take 81 mg by mouth daily.         . Cyanocobalamin (VITAMIN B 12 PO)   Oral   Take 1,000 mcg by mouth daily.        . cyclobenzaprine (FLEXERIL) 10 MG tablet   Oral   Take 10 mg by mouth daily.         . furosemide (LASIX) 20 MG tablet   Oral   Take 20 mg by mouth 2 (two) times daily.         Marland Kitchen gabapentin (NEURONTIN) 300 MG capsule   Oral   Take 300 mg by mouth daily.         Marland Kitchen glimepiride (AMARYL) 4 MG tablet   Oral   Take 4 mg by mouth daily before breakfast.         . hydrALAZINE (APRESOLINE) 10 MG tablet   Oral   Take 1 tablet (10 mg total) by mouth 4 (four) times daily.   120 tablet   0   . hydrochlorothiazide (HYDRODIURIL) 25 MG tablet   Oral   Take 25 mg by mouth daily.         . insulin NPH-regular (NOVOLIN 70/30) (70-30) 100 UNIT/ML injection   Subcutaneous   Inject 40 Units into the skin daily as needed. Blood sugar >200 take insulin         . levothyroxine (SYNTHROID, LEVOTHROID) 50 MCG tablet   Oral  Take 50 mcg by mouth daily.         Marland Kitchen lisinopril (PRINIVIL,ZESTRIL) 20 MG tablet   Oral   Take 20 mg by mouth daily.         . metFORMIN (GLUCOPHAGE) 1000 MG tablet   Oral   Take 1,000 mg by mouth 2 (two) times daily with a meal.         . metoprolol tartrate (LOPRESSOR) 25 MG tablet   Oral   Take 25 mg by mouth 2 (two) times daily.         . naproxen (NAPROSYN) 500 MG tablet   Oral   Take 1 tablet by mouth 2 (two) times daily.         Marland Kitchen omeprazole (PRILOSEC) 40 MG capsule   Oral   Take 40 mg by mouth daily.         . potassium chloride SA (K-DUR,KLOR-CON) 20 MEQ tablet   Oral   Take 20 mEq by mouth 2 (two) times daily.          BP 228/82  Pulse 57  Temp(Src) 98.1 F (36.7 C) (Oral)  Resp 20  SpO2 100% Physical Exam  Nursing note and vitals reviewed. Constitutional: She is oriented to person, place, and time. She appears well-developed.  Elderly, obese  HENT:  Head: Normocephalic and atraumatic.  Eyes: Conjunctivae and EOM are normal. Pupils are equal, round, and reactive to light.  Neck: Normal range of motion and  phonation normal. Neck supple.  Cardiovascular: Normal rate, regular rhythm and intact distal pulses.   Pulmonary/Chest: Effort normal and breath sounds normal. She exhibits no tenderness.  Abdominal: Soft. She exhibits no distension. There is no tenderness. There is no guarding.  Musculoskeletal: Normal range of motion.  Neurological: She is alert and oriented to person, place, and time. She exhibits normal muscle tone.  Skin: Skin is warm and dry.  Psychiatric: She has a normal mood and affect. Her behavior is normal. Judgment and thought content normal.    ED Course  Procedures (including critical care time)  Medications - No data to display  Patient Vitals for the past 24 hrs:  BP Temp Temp src Pulse Resp SpO2  10/31/13 2030 228/82 mmHg - - - - -  10/31/13 1930 188/97 mmHg - - - 20 -  10/31/13 1915 195/83 mmHg - - - 25 -  10/31/13 1900 202/84 mmHg - - 57 27 100 %  10/31/13 1845 196/116 mmHg - - 60 21 100 %  10/31/13 1830 227/85 mmHg - - 59 23 100 %  10/31/13 1815 219/80 mmHg - - 58 22 100 %  10/31/13 1800 224/67 mmHg - - 59 24 100 %  10/31/13 1600 225/88 mmHg 98.1 F (36.7 C) Oral 117 18 93 %     9:26 PM Reevaluation with update and discussion. After initial assessment and treatment, an updated evaluation reveals no further c/o. Ahmon Tosi L       Labs Review Labs Reviewed  CBC WITH DIFFERENTIAL - Abnormal; Notable for the following:    Hemoglobin 11.9 (*)    HCT 34.8 (*)    All other components within normal limits  POCT I-STAT, CHEM 8 - Abnormal; Notable for the following:    Glucose, Bld 123 (*)    All other components within normal limits  POCT I-STAT TROPONIN I   Imaging Review Dg Chest 2 View  10/31/2013   CLINICAL DATA:  Short of breath.  EXAM: CHEST  2 VIEW  COMPARISON:  06/29/2013.  FINDINGS: The heart size and mediastinal contours are within normal limits. Both lungs are clear. The visualized skeletal structures are unremarkable.  IMPRESSION: No  active cardiopulmonary disease.   Electronically Signed   By: Andreas Newport M.D.   On: 10/31/2013 20:07    EKG Interpretation    Date/Time:  Friday October 31 2013 16:04:25 EST Ventricular Rate:  62 PR Interval:  156 QRS Duration: 80 QT Interval:  430 QTC Calculation: 436 R Axis:   73 Text Interpretation:  Normal sinus rhythm Cannot rule out Anterior infarct , age undetermined ST \\T \ T wave abnormality, consider inferior ischemia Abnormal ECG Artifact Since last tracing cannot ruleout new ischemia serial tracing recommended Confirmed by Effie Shy  MD, Lillyrose Reitan ([id number]) on 10/31/2013 6:42:46 PM            MDM   1. Hypertension        Hypertension, without endorgan injury. She is on low-dose hydralazine and can be treated by increasing  her usual dose to 4 times a day. She is currently only on twice a day. Doubt ACS, PE, or pneumonia.  The patient appears reasonably screened and/or stabilized for discharge and I doubt any other medical condition or other [facility] requiring further screening, evaluation, or treatment in the ED at this time prior to discharge.  Nursing Notes Reviewed/ Care Coordinated, and agree without changes. Applicable Imaging Reviewed.  Interpretation of Laboratory Data incorporated into ED treatment   Plan: Home Medications- increase hydralazine to 4 times a day; Home Treatments and Observation- rest; return here if the recommended treatment, does not improve the symptoms; Recommended follow up- PCP check up 1 week       Flint Melter, MD 10/31/13 2146

## 2013-10-31 NOTE — ED Notes (Signed)
Pt st's she checked her blood pressure at home and it was high.  Pt st's she is currently taking HTN meds and sometimes forget to take it.  Pt alert and oriented x's 3.

## 2013-10-31 NOTE — ED Notes (Signed)
Pt here from with c/o htn  No c/o chest pain or sob , pt is legally blind

## 2013-10-31 NOTE — ED Notes (Signed)
Pt given [place] sandwich and ambulated to bathroom

## 2014-01-09 ENCOUNTER — Encounter (HOSPITAL_COMMUNITY): Payer: Self-pay | Admitting: Emergency Medicine

## 2014-01-09 ENCOUNTER — Emergency Department (HOSPITAL_COMMUNITY): Payer: Medicare HMO

## 2014-01-09 ENCOUNTER — Inpatient Hospital Stay (HOSPITAL_COMMUNITY)
Admission: EM | Admit: 2014-01-09 | Discharge: 2014-01-16 | DRG: 065 | Disposition: A | Discharge status: Home Health | Payer: Medicare HMO | Attending: Internal Medicine | Admitting: Internal Medicine

## 2014-01-09 DIAGNOSIS — E119 Type 2 diabetes mellitus without complications: Secondary | ICD-10-CM | POA: Diagnosis present

## 2014-01-09 DIAGNOSIS — F17200 Nicotine dependence, unspecified, uncomplicated: Secondary | ICD-10-CM | POA: Diagnosis present

## 2014-01-09 DIAGNOSIS — G8190 Hemiplegia, unspecified affecting unspecified side: Secondary | ICD-10-CM | POA: Diagnosis present

## 2014-01-09 DIAGNOSIS — I739 Peripheral vascular disease, unspecified: Secondary | ICD-10-CM | POA: Diagnosis present

## 2014-01-09 DIAGNOSIS — E1165 Type 2 diabetes mellitus with hyperglycemia: Secondary | ICD-10-CM

## 2014-01-09 DIAGNOSIS — I10 Essential (primary) hypertension: Secondary | ICD-10-CM

## 2014-01-09 DIAGNOSIS — IMO0002 Reserved for concepts with insufficient information to code with codable children: Secondary | ICD-10-CM

## 2014-01-09 DIAGNOSIS — T502X5A Adverse effect of carbonic-anhydrase inhibitors, benzothiadiazides and other diuretics, initial encounter: Secondary | ICD-10-CM | POA: Diagnosis present

## 2014-01-09 DIAGNOSIS — I639 Cerebral infarction, unspecified: Secondary | ICD-10-CM

## 2014-01-09 DIAGNOSIS — E118 Type 2 diabetes mellitus with unspecified complications: Secondary | ICD-10-CM

## 2014-01-09 DIAGNOSIS — Z794 Long term (current) use of insulin: Secondary | ICD-10-CM

## 2014-01-09 DIAGNOSIS — E785 Hyperlipidemia, unspecified: Secondary | ICD-10-CM | POA: Diagnosis present

## 2014-01-09 DIAGNOSIS — N189 Chronic kidney disease, unspecified: Secondary | ICD-10-CM | POA: Diagnosis present

## 2014-01-09 DIAGNOSIS — I6789 Other cerebrovascular disease: Secondary | ICD-10-CM | POA: Diagnosis present

## 2014-01-09 DIAGNOSIS — H548 Legal blindness, as defined in USA: Secondary | ICD-10-CM | POA: Diagnosis present

## 2014-01-09 DIAGNOSIS — R42 Dizziness and giddiness: Secondary | ICD-10-CM

## 2014-01-09 DIAGNOSIS — W19XXXA Unspecified fall, initial encounter: Secondary | ICD-10-CM

## 2014-01-09 DIAGNOSIS — E039 Hypothyroidism, unspecified: Secondary | ICD-10-CM | POA: Diagnosis present

## 2014-01-09 DIAGNOSIS — Z7982 Long term (current) use of aspirin: Secondary | ICD-10-CM

## 2014-01-09 DIAGNOSIS — I6330 Cerebral infarction due to thrombosis of unspecified cerebral artery: Principal | ICD-10-CM | POA: Diagnosis present

## 2014-01-09 DIAGNOSIS — E876 Hypokalemia: Secondary | ICD-10-CM | POA: Diagnosis present

## 2014-01-09 DIAGNOSIS — Z79899 Other long term (current) drug therapy: Secondary | ICD-10-CM

## 2014-01-09 DIAGNOSIS — E1129 Type 2 diabetes mellitus with other diabetic kidney complication: Secondary | ICD-10-CM | POA: Diagnosis present

## 2014-01-09 DIAGNOSIS — Z8673 Personal history of transient ischemic attack (TIA), and cerebral infarction without residual deficits: Secondary | ICD-10-CM

## 2014-01-09 DIAGNOSIS — M949 Disorder of cartilage, unspecified: Secondary | ICD-10-CM

## 2014-01-09 DIAGNOSIS — M899 Disorder of bone, unspecified: Secondary | ICD-10-CM | POA: Diagnosis present

## 2014-01-09 DIAGNOSIS — I129 Hypertensive chronic kidney disease with stage 1 through stage 4 chronic kidney disease, or unspecified chronic kidney disease: Secondary | ICD-10-CM | POA: Diagnosis present

## 2014-01-09 LAB — CBC
HCT: 38 % (ref 36.0–46.0)
Hemoglobin: 13.6 g/dL (ref 12.0–15.0)
MCH: 30.7 pg (ref 26.0–34.0)
MCHC: 35.8 g/dL (ref 30.0–36.0)
MCV: 85.8 fL (ref 78.0–100.0)
PLATELETS: 255 10*3/uL (ref 150–400)
RBC: 4.43 MIL/uL (ref 3.87–5.11)
RDW: 12.2 % (ref 11.5–15.5)
WBC: 6.4 10*3/uL (ref 4.0–10.5)

## 2014-01-09 LAB — I-STAT TROPONIN, ED: Troponin i, poc: 0.03 ng/mL (ref 0.00–0.08)

## 2014-01-09 LAB — COMPREHENSIVE METABOLIC PANEL
ALBUMIN: 4 g/dL (ref 3.5–5.2)
ALT: 6 U/L (ref 0–35)
AST: 13 U/L (ref 0–37)
Alkaline Phosphatase: 88 U/L (ref 39–117)
BILIRUBIN TOTAL: 0.2 mg/dL — AB (ref 0.3–1.2)
BUN: 24 mg/dL — ABNORMAL HIGH (ref 6–23)
CHLORIDE: 97 meq/L (ref 96–112)
CO2: 31 meq/L (ref 19–32)
Calcium: 9.5 mg/dL (ref 8.4–10.5)
Creatinine, Ser: 1.45 mg/dL — ABNORMAL HIGH (ref 0.50–1.10)
GFR calc Af Amer: 40 mL/min — ABNORMAL LOW (ref >90)
GFR, EST NON AFRICAN AMERICAN: 35 mL/min — AB (ref >90)
Glucose, Bld: 80 mg/dL (ref 70–99)
Potassium: 3.2 mEq/L — ABNORMAL LOW (ref 3.7–5.3)
SODIUM: 142 meq/L (ref 137–147)
Total Protein: 7.9 g/dL (ref 6.0–8.3)

## 2014-01-09 LAB — I-STAT CHEM 8, ED
BUN: 24 mg/dL — ABNORMAL HIGH (ref 6–23)
CHLORIDE: 97 meq/L (ref 96–112)
CREATININE: 1.6 mg/dL — AB (ref 0.50–1.10)
Calcium, Ion: 1.12 mmol/L — ABNORMAL LOW (ref 1.13–1.30)
Glucose, Bld: 77 mg/dL (ref 70–99)
HCT: 43 % (ref 36.0–46.0)
Hemoglobin: 14.6 g/dL (ref 12.0–15.0)
POTASSIUM: 3.1 meq/L — AB (ref 3.7–5.3)
Sodium: 140 mEq/L (ref 137–147)
TCO2: 29 mmol/L (ref 0–100)

## 2014-01-09 LAB — RAPID URINE DRUG SCREEN, HOSP PERFORMED
Amphetamines: NOT DETECTED
Barbiturates: NOT DETECTED
Benzodiazepines: NOT DETECTED
Cocaine: NOT DETECTED
Opiates: NOT DETECTED
TETRAHYDROCANNABINOL: NOT DETECTED

## 2014-01-09 LAB — URINALYSIS, ROUTINE W REFLEX MICROSCOPIC
Bilirubin Urine: NEGATIVE
Glucose, UA: >1000 mg/dL — AB
Hgb urine dipstick: NEGATIVE
Ketones, ur: NEGATIVE mg/dL
LEUKOCYTES UA: NEGATIVE
NITRITE: NEGATIVE
PH: 6.5 (ref 5.0–8.0)
Protein, ur: 30 mg/dL — AB
Specific Gravity, Urine: 1.017 (ref 1.005–1.030)
Urobilinogen, UA: 0.2 mg/dL (ref 0.0–1.0)

## 2014-01-09 LAB — DIFFERENTIAL
BASOS ABS: 0 10*3/uL (ref 0.0–0.1)
BASOS PCT: 0 % (ref 0–1)
Eosinophils Absolute: 0.1 10*3/uL (ref 0.0–0.7)
Eosinophils Relative: 1 % (ref 0–5)
LYMPHS PCT: 32 % (ref 12–46)
Lymphs Abs: 2 10*3/uL (ref 0.7–4.0)
Monocytes Absolute: 0.6 10*3/uL (ref 0.1–1.0)
Monocytes Relative: 9 % (ref 3–12)
NEUTROS ABS: 3.8 10*3/uL (ref 1.7–7.7)
Neutrophils Relative %: 58 % (ref 43–77)

## 2014-01-09 LAB — URINE MICROSCOPIC-ADD ON

## 2014-01-09 LAB — PROTIME-INR
INR: 1.01 (ref 0.00–1.49)
PROTHROMBIN TIME: 13.1 s (ref 11.6–15.2)

## 2014-01-09 LAB — APTT: APTT: 24 s (ref 24–37)

## 2014-01-09 LAB — ETHANOL: Alcohol, Ethyl (B): <11 mg/dL (ref 0–11)

## 2014-01-09 NOTE — ED Provider Notes (Signed)
Patient states she slid the ground today due to lack of balance.. She presently feels "drunk"on exam speech mildly slurred. To come score 14 one off her speech He is on to perform finger to nose or heel to shin.suspect posterior circulation stroke  Doug SouSam Osie Merkin, MD 01/10/14 0006

## 2014-01-09 NOTE — ED Notes (Signed)
Per EMS- Pt was sitting on couch and felt "drunk" slid to ground, denies LOC. Alert and oriented. Unable to obtain IV access. Speech is slurred. Irregular EKG V1-V3. History of CVA

## 2014-01-09 NOTE — ED Notes (Signed)
MR called, states about 20-30 minutes until patient.

## 2014-01-09 NOTE — ED Provider Notes (Signed)
CSN: [id number]     Arrival date & time 01/09/14  1959 History   First MD Initiated Contact with Patient 01/09/14 1959     Chief Complaint  Patient presents with  . Fall      HPI  73 yo F who presents via EMS after she slid down to the ground due to feeling "drunk". Her husband called 911 and on arrival they found her mentating appropriately. Patient denies any HA, SOB, CP, ABD pain. She reports feeling dizzy when asked to describe how she feels she states she feels "drunk". Pupils are pinpoint. She denies the use of any narcotics.  Per family patient also had slurred speech. On arrival patient now states she is fine and wants to go home. 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   Past Medical History  Diagnosis Date  . Hypertension   . Diabetes mellitus   . Stroke   . Chronic renal insufficiency   . Hyperlipidemia   . Osteopenia   . CVA (cerebral infarction)    Past Surgical History  Procedure Laterality Date  . Eye surgery     Family History  Problem Relation Age of Onset  . Diabetes Mother   . Hypertension Mother    History  Substance Use Topics  . Smoking status: Current Every Day Smoker -- 0.50 packs/day for 60 years    Types: Cigarettes  . Smokeless tobacco: Not on file     Comment: pt states "sometimes I quit smoking for about 2 weeks then I start back"  . Alcohol Use: Yes     Comment: occasionally   OB History   Grav Para Term Preterm Abortions TAB SAB Ect Mult Living                 Review of Systems  Constitutional: Negative for fever, chills, activity change and appetite change.  HENT: Negative for congestion, ear pain and rhinorrhea.   Eyes: Negative for pain.  Respiratory: Negative for cough and shortness of breath.   Cardiovascular: Negative for chest pain and palpitations.  Gastrointestinal: Negative for nausea, vomiting and abdominal pain.  Genitourinary: Negative for dysuria, difficulty urinating and pelvic  pain.  Musculoskeletal: Negative for back pain and neck pain.  Skin: Negative for rash and wound.  Neurological: Positive for dizziness. Negative for weakness and headaches.  Psychiatric/Behavioral: Negative for behavioral problems, confusion and agitation.      Allergies  Erythromycin; Iodine; and Penicillins  Home Medications   Current Outpatient Rx  Name  Route  Sig  Dispense  Refill  . aspirin EC 81 MG tablet   Oral   Take 81 mg by mouth daily.         . cyclobenzaprine (FLEXERIL) 10 MG tablet   Oral   Take 10 mg by mouth daily.         . furosemide (LASIX) 20 MG tablet   Oral   Take 20 mg by mouth 2 (two) times daily.         Marland Kitchen gabapentin (NEURONTIN) 300 MG capsule   Oral   Take 300 mg by mouth daily.         Marland Kitchen glimepiride (AMARYL) 4 MG tablet   Oral   Take 4 mg by mouth daily before breakfast.         . hydrALAZINE (APRESOLINE) 10 MG tablet   Oral   Take 1 tablet (10 mg total) by mouth 4 (four) times daily.  120 tablet   0   . hydrochlorothiazide (HYDRODIURIL) 25 MG tablet   Oral   Take 25 mg by mouth daily.         . insulin NPH-regular (NOVOLIN 70/30) (70-30) 100 UNIT/ML injection   Subcutaneous   Inject 40 Units into the skin daily as needed. Blood sugar >200 take insulin         . levothyroxine (SYNTHROID, LEVOTHROID) 50 MCG tablet   Oral   Take 50 mcg by mouth daily.         Marland Kitchen lisinopril (PRINIVIL,ZESTRIL) 20 MG tablet   Oral   Take 20 mg by mouth daily.         Marland Kitchen LUMIGAN 0.01 % SOLN   Both Eyes   Place 1 drop into both eyes at bedtime.          . metFORMIN (GLUCOPHAGE) 1000 MG tablet   Oral   Take 1,000 mg by mouth 2 (two) times daily with a meal.         . metoprolol succinate (TOPROL-XL) 25 MG 24 hr tablet   Oral   Take 25 mg by mouth daily.          . metoprolol tartrate (LOPRESSOR) 25 MG tablet   Oral   Take 25 mg by mouth 2 (two) times daily.         . naproxen (NAPROSYN) 500 MG tablet   Oral    Take 1 tablet by mouth 2 (two) times daily.         Marland Kitchen omeprazole (PRILOSEC) 40 MG capsule   Oral   Take 40 mg by mouth daily.         Marland Kitchen PATADAY 0.2 % SOLN   Both Eyes   Place 1 drop into both eyes daily.          . potassium chloride SA (K-DUR,KLOR-CON) 20 MEQ tablet   Oral   Take 20 mEq by mouth 2 (two) times daily.          BP 101/67  Pulse 66  Temp(Src) 98.8 F (37.1 C)  Resp 15  SpO2 100% Physical Exam  Constitutional: She is oriented to person, place, and time. She appears well-developed and well-nourished. No distress.  Obese   HENT:  Head: Normocephalic and atraumatic.  Nose: Nose normal.  Mouth/Throat: Oropharynx is clear and moist.  Eyes: EOM are normal. Pupils are equal, round, and reactive to light.  Pinpoint   Neck: Normal range of motion. Neck supple. No tracheal deviation present.  Cardiovascular: Normal rate, regular rhythm and intact distal pulses.   Murmur heard. Pulmonary/Chest: Effort normal and breath sounds normal. She has no rales.  Abdominal: Soft. Bowel sounds are normal. She exhibits no distension. There is no tenderness. There is no rebound and no guarding.  Musculoskeletal: Normal range of motion. She exhibits edema ( +1 pitting to ankles). She exhibits no tenderness.  Neurological: She is alert and oriented to person, place, and time.  Unable to perform finger to nose or heel to shin. BLT symmetric lower extremity weakness.   Skin: Skin is warm and dry. No rash noted.  Psychiatric: She has a normal mood and affect. Her behavior is normal.    ED Course  Procedures (including critical care time) Labs Review Labs Reviewed  COMPREHENSIVE METABOLIC PANEL - Abnormal; Notable for the following:    Potassium 3.2 (*)    BUN 24 (*)    Creatinine, Ser 1.45 (*)    Total Bilirubin 0.2 (*)  GFR calc non Af Amer 35 (*)    GFR calc Af Amer 40 (*)    All other components within normal limits  URINALYSIS, ROUTINE W REFLEX MICROSCOPIC -  Abnormal; Notable for the following:    Glucose, UA >1000 (*)    Protein, ur 30 (*)    All other components within normal limits  URINE MICROSCOPIC-ADD ON - Abnormal; Notable for the following:    Squamous Epithelial / LPF FEW (*)    Casts HYALINE CASTS (*)    All other components within normal limits  I-STAT CHEM 8, ED - Abnormal; Notable for the following:    Potassium 3.1 (*)    BUN 24 (*)    Creatinine, Ser 1.60 (*)    Calcium, Ion 1.12 (*)    All other components within normal limits  ETHANOL  PROTIME-INR  APTT  CBC  DIFFERENTIAL  URINE RAPID DRUG SCREEN (HOSP PERFORMED)  Rosezena Sensor, ED   Imaging Review Mr Angiogram Head Wo Contrast  01/10/2014   CLINICAL DATA:  Dizziness. Evaluate for posterior circulation stroke.  EXAM: MRI HEAD WITHOUT CONTRAST  MRA HEAD WITHOUT CONTRAST  TECHNIQUE: Multiplanar, multiecho pulse sequences of the brain and surrounding structures were obtained without intravenous contrast. Angiographic images of the head were obtained using MRA technique without contrast.  COMPARISON:  None available.  FINDINGS: MRI HEAD FINDINGS  Diffuse prominence of the CSF containing spaces is compatible with mild age-related atrophy. Scattered and confluent T2/FLAIR hyperintensity within the periventricular and deep white matter both cerebral hemispheres is compatible with chronic small vessel ischemic changes. Similar changes are seen within the pons. Scattered remote cerebellar infarcts are noted bilaterally, most evident within the and left cerebellar hemisphere (series 10, image 17). Additional scattered remote lacunar infarcts seen within the periventricular and deep white matter both cerebral hemispheres bilaterally, most evident adjacent to the anterior horn of the left lateral ventricle (series 8, image 13). Remote lacunar infarcts within the bilateral basal ganglia also noted.  Single tiny focus of restricted diffusion measuring 5 mm is seen within the periatrial white  matter of the left frontoparietal region (series 3, image 19). No other abnormal foci of restricted diffusion are seen. No evidence of acute intracranial hemorrhage. Multiple scattered subcentimeter hypo intense foci seen on gradient echo sequence within the supratentorial and infratentorial brain likely represent small chronic micro hemorrhages, which may be related to hypertension.  No mass lesion, midline shift, or extra-axial fluid collection. Ventricles are normal in size without evidence of hydrocephalus.  No diffusion-weighted signal abnormality is identified to suggest acute intracranial infarct. Gray-white matter differentiation is maintained. Normal flow voids are seen within the intracranial vasculature. No intracranial hemorrhage identified.  The cervicomedullary junction is normal. Pituitary gland is within normal limits. Pituitary stalk is midline. The globes and optic nerves demonstrate a normal appearance with normal signal intensity. The  The bone marrow signal intensity is normal. Calvarium is intact. Visualized upper cervical spine is within normal limits.  Scalp soft tissues are unremarkable.  Paranasal sinuses are clear.  No mastoid effusion.  MRA HEAD FINDINGS  Anterior circulation: Normal flow related enhancement of the included cervical internal carotid arteries. The petrous, cavernous, and supra clinoid segments are widely patent bilaterally with antegrade flow. Patent anterior communicating artery. Normal flow related enhancement of the anterior and middle cerebral arteries, including more distal segments. Multi focal irregularity seen within the M1 segments bilaterally, likely related to underlying atheromatous disease.  Posterior circulation: Right Vertebral artery is dominant. Short-segment  stenosis of at least 70% is seen within the distal left vertebral artery just prior to the vertebrobasilar junction. Basilar artery is patent, with normal flow related enhancement of the main branch  vessels. Normal flow related enhancement of the posterior cerebral arteries. Short segment stenosis of approximately 50% seen within the right P2 segment.  No large vessel occlusion, hemodynamically significant stenosis, abnormal luminal irregularity, aneurysm within the anterior nor posterior circulation.  IMPRESSION: MRI HEAD:  1. 5 mm acute ischemic infarct within the left periatrial white matter as above. 2. Remote infarcts involving the bilateral cerebellar hemispheres, with additional scattered remote lacunar infarcts involving the bilateral basal ganglia and supratentorial white matter. 3. Moderate atrophy and chronic microvascular ischemic disease within the supratentorial white matter and pons.  MRA HEAD:  1. 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 The right vertebral artery is dominant. 2. Short-segment stenosis of approximately 50% within the right P2 segment. 3. No other high-grade flow-limiting stenosis or proximal branch occlusion identified. 4. No intracranial aneurysm.   Electronically Signed   By: Rise MuBenjamin  McClintock M.D.   On: 01/10/2014 00:01   Mr Brain Wo Contrast  01/10/2014   CLINICAL DATA:  Dizziness. Evaluate for posterior circulation stroke.  EXAM: MRI HEAD WITHOUT CONTRAST  MRA HEAD WITHOUT CONTRAST  TECHNIQUE: Multiplanar, multiecho pulse sequences of the brain and surrounding structures were obtained without intravenous contrast. Angiographic images of the head were obtained using MRA technique without contrast.  COMPARISON:  None available.  FINDINGS: MRI HEAD FINDINGS  Diffuse prominence of the CSF containing spaces is compatible with mild age-related atrophy. Scattered and confluent T2/FLAIR hyperintensity within the periventricular and deep white matter both cerebral hemispheres is compatible with chronic small vessel ischemic changes. Similar changes are seen within the pons. Scattered remote  cerebellar infarcts are noted bilaterally, most evident within the and left cerebellar hemisphere (series 10, image 17). Additional scattered remote lacunar infarcts seen within the periventricular and deep white matter both cerebral hemispheres bilaterally, most evident adjacent to the anterior horn of the left lateral ventricle (series 8, image 13). Remote lacunar infarcts within the bilateral basal ganglia also noted.  Single tiny focus of restricted diffusion measuring 5 mm is seen within the periatrial white matter of the left frontoparietal region (series 3, image 19). No other abnormal foci of restricted diffusion are seen. No evidence of acute intracranial hemorrhage. Multiple scattered subcentimeter hypo intense foci seen on gradient echo sequence within the supratentorial and infratentorial brain likely represent small chronic micro hemorrhages, which may be related to hypertension.  No mass lesion, midline shift, or extra-axial fluid collection. Ventricles are normal in size without evidence of hydrocephalus.  No diffusion-weighted signal abnormality is identified to suggest acute intracranial infarct. Gray-white matter differentiation is maintained. Normal flow voids are seen within the intracranial vasculature. No intracranial hemorrhage identified.  The cervicomedullary junction is normal. Pituitary gland is within normal limits. Pituitary stalk is midline. The globes and optic nerves demonstrate a normal appearance with normal signal intensity. The  The bone marrow signal intensity is normal. Calvarium is intact. Visualized upper cervical spine is within normal limits.  Scalp soft tissues are unremarkable.  Paranasal sinuses are clear.  No mastoid effusion.  MRA HEAD FINDINGS  Anterior circulation: Normal flow related enhancement of the included cervical internal carotid arteries. The petrous, cavernous, and supra clinoid segments are widely patent bilaterally with antegrade flow. Patent anterior  communicating artery. Normal flow  related enhancement of the anterior and middle cerebral arteries, including more distal segments. Multi focal irregularity seen within the M1 segments bilaterally, likely related to underlying atheromatous disease.  Posterior circulation: Right Vertebral artery is dominant. Short-segment stenosis of at least 70% is seen within the distal left vertebral artery just prior to the vertebrobasilar junction. Basilar artery is patent, with normal flow related enhancement of the main branch vessels. Normal flow related enhancement of the posterior cerebral arteries. Short segment stenosis of approximately 50% seen within the right P2 segment.  No large vessel occlusion, hemodynamically significant stenosis, abnormal luminal irregularity, aneurysm within the anterior nor posterior circulation.  IMPRESSION: MRI HEAD:  1. 5 mm acute ischemic infarct within the left periatrial white matter as above. 2. Remote infarcts involving the bilateral cerebellar hemispheres, with additional scattered remote lacunar infarcts involving the bilateral basal ganglia and supratentorial white matter. 3. Moderate atrophy and chronic microvascular ischemic disease within the supratentorial white matter and pons.  MRA HEAD:  1. 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 The right vertebral artery is dominant. 2. Short-segment stenosis of approximately 50% within the right P2 segment. 3. No other high-grade flow-limiting stenosis or proximal branch occlusion identified. 4. No intracranial aneurysm.   Electronically Signed   By: Rise Mu M.D.   On: 01/10/2014 00:01     EKG Interpretation   Date/Time:  Friday January 09 2014 20:31:17 EST Ventricular Rate:  65 PR Interval:  157 QRS Duration: 86 QT Interval:  440 QTC Calculation: 457 R Axis:   31 Text Interpretation:  Sinus rhythm Abnormal T, consider ischemia,  diffuse  leads ST elevation, consider anterior injury No significant change since  last tracing Confirmed by Ethelda Chick  MD, SAM [phone]) on 01/09/2014 8:40:13  PM      MDM   Final diagnoses:  Fall  Dizziness   73 yo F presents after a fall via EMS. She endorses dizziness. Exam with dysmetria and dysarthria.  Additionally has blt lower extremity weakness. Patient is a poor historian and is asking to go home. She states she is fine and wants to eat. I informed her given her exam I am concerned for cerebellar stroke. Will obtain  Labs and imaging. MR brain and MRA brain ordered.   12:17 AM MR with acute 5 mm parietal stroke. Case discussed with Dr. Luan Pulling. Plans to evaluate patient. Patient has passed her swallow study will administer ASA. Additionally patient is now able to stand up. No longer having slurred speech according to family.   1:05 AM Dr. Luan Pulling recommended inpatient admission for further stroke workup. No TPA or emergent intervention recommended at this time as most of her symptoms are old and those that are new are improving rapidly.   1:07AM Consulted hospitalist team. Plan to admit.   Case discussed with Dr. Lovell Sheehan who will admit patient.   Patient updated multiple times of findings and plan. She insists she is fine and would like to go home.      Nadara Mustard, MD 01/10/14 [phone]

## 2014-01-10 ENCOUNTER — Inpatient Hospital Stay (HOSPITAL_COMMUNITY): Payer: Medicare HMO

## 2014-01-10 ENCOUNTER — Encounter (HOSPITAL_COMMUNITY): Payer: Self-pay | Admitting: General Practice

## 2014-01-10 DIAGNOSIS — I639 Cerebral infarction, unspecified: Secondary | ICD-10-CM | POA: Insufficient documentation

## 2014-01-10 DIAGNOSIS — E785 Hyperlipidemia, unspecified: Secondary | ICD-10-CM | POA: Diagnosis present

## 2014-01-10 DIAGNOSIS — M899 Disorder of bone, unspecified: Secondary | ICD-10-CM | POA: Diagnosis present

## 2014-01-10 DIAGNOSIS — I129 Hypertensive chronic kidney disease with stage 1 through stage 4 chronic kidney disease, or unspecified chronic kidney disease: Secondary | ICD-10-CM | POA: Diagnosis present

## 2014-01-10 DIAGNOSIS — R42 Dizziness and giddiness: Secondary | ICD-10-CM | POA: Diagnosis present

## 2014-01-10 DIAGNOSIS — F17200 Nicotine dependence, unspecified, uncomplicated: Secondary | ICD-10-CM | POA: Diagnosis present

## 2014-01-10 DIAGNOSIS — I6789 Other cerebrovascular disease: Secondary | ICD-10-CM | POA: Diagnosis present

## 2014-01-10 DIAGNOSIS — I6330 Cerebral infarction due to thrombosis of unspecified cerebral artery: Secondary | ICD-10-CM | POA: Diagnosis present

## 2014-01-10 DIAGNOSIS — I739 Peripheral vascular disease, unspecified: Secondary | ICD-10-CM | POA: Diagnosis present

## 2014-01-10 DIAGNOSIS — Z8673 Personal history of transient ischemic attack (TIA), and cerebral infarction without residual deficits: Secondary | ICD-10-CM | POA: Diagnosis present

## 2014-01-10 DIAGNOSIS — E1129 Type 2 diabetes mellitus with other diabetic kidney complication: Secondary | ICD-10-CM | POA: Diagnosis present

## 2014-01-10 DIAGNOSIS — E876 Hypokalemia: Secondary | ICD-10-CM | POA: Diagnosis present

## 2014-01-10 DIAGNOSIS — N189 Chronic kidney disease, unspecified: Secondary | ICD-10-CM | POA: Diagnosis present

## 2014-01-10 DIAGNOSIS — E119 Type 2 diabetes mellitus without complications: Secondary | ICD-10-CM

## 2014-01-10 DIAGNOSIS — IMO0002 Reserved for concepts with insufficient information to code with codable children: Secondary | ICD-10-CM | POA: Diagnosis present

## 2014-01-10 DIAGNOSIS — E039 Hypothyroidism, unspecified: Secondary | ICD-10-CM | POA: Diagnosis present

## 2014-01-10 DIAGNOSIS — E1165 Type 2 diabetes mellitus with hyperglycemia: Secondary | ICD-10-CM

## 2014-01-10 DIAGNOSIS — Z79899 Other long term (current) drug therapy: Secondary | ICD-10-CM | POA: Diagnosis not present

## 2014-01-10 DIAGNOSIS — H548 Legal blindness, as defined in USA: Secondary | ICD-10-CM | POA: Diagnosis present

## 2014-01-10 DIAGNOSIS — G8190 Hemiplegia, unspecified affecting unspecified side: Secondary | ICD-10-CM | POA: Diagnosis present

## 2014-01-10 DIAGNOSIS — T502X5A Adverse effect of carbonic-anhydrase inhibitors, benzothiadiazides and other diuretics, initial encounter: Secondary | ICD-10-CM | POA: Diagnosis present

## 2014-01-10 DIAGNOSIS — I10 Essential (primary) hypertension: Secondary | ICD-10-CM | POA: Diagnosis present

## 2014-01-10 DIAGNOSIS — I517 Cardiomegaly: Secondary | ICD-10-CM

## 2014-01-10 DIAGNOSIS — Z794 Long term (current) use of insulin: Secondary | ICD-10-CM | POA: Diagnosis not present

## 2014-01-10 DIAGNOSIS — W19XXXA Unspecified fall, initial encounter: Secondary | ICD-10-CM

## 2014-01-10 DIAGNOSIS — Z7982 Long term (current) use of aspirin: Secondary | ICD-10-CM | POA: Diagnosis not present

## 2014-01-10 LAB — GLUCOSE, CAPILLARY
GLUCOSE-CAPILLARY: 242 mg/dL — AB (ref 70–99)
GLUCOSE-CAPILLARY: 37 mg/dL — AB (ref 70–99)
GLUCOSE-CAPILLARY: 61 mg/dL — AB (ref 70–99)
Glucose-Capillary: 170 mg/dL — ABNORMAL HIGH (ref 70–99)
Glucose-Capillary: 92 mg/dL (ref 70–99)
Glucose-Capillary: 245 mg/dL — ABNORMAL HIGH (ref 70–99)

## 2014-01-10 LAB — HEMOGLOBIN A1C
HEMOGLOBIN A1C: 10.4 % — AB (ref <5.7)
Mean Plasma Glucose: 252 mg/dL — ABNORMAL HIGH (ref <117)

## 2014-01-10 LAB — LIPID PANEL
CHOL/HDL RATIO: 5.1 ratio
Cholesterol: 229 mg/dL — ABNORMAL HIGH (ref 0–200)
HDL: 45 mg/dL (ref >39)
LDL Cholesterol: 141 mg/dL — ABNORMAL HIGH (ref 0–99)
Triglycerides: 215 mg/dL — ABNORMAL HIGH (ref <150)
VLDL: 43 mg/dL — ABNORMAL HIGH (ref 0–40)

## 2014-01-10 LAB — MAGNESIUM: Magnesium: 1.4 mg/dL — ABNORMAL LOW (ref 1.5–2.5)

## 2014-01-10 MED ORDER — GABAPENTIN 300 MG PO CAPS
300.0000 mg | ORAL_CAPSULE | Freq: Every day | ORAL | Status: DC
  Administered 2014-01-10 – 2014-01-16 (×7): 300 mg via ORAL
  Filled 2014-01-10 (×7): qty 1

## 2014-01-10 MED ORDER — LISINOPRIL 20 MG PO TABS
20.0000 mg | ORAL_TABLET | Freq: Every day | ORAL | Status: DC
  Administered 2014-01-10 – 2014-01-16 (×7): 20 mg via ORAL
  Filled 2014-01-10 (×7): qty 1

## 2014-01-10 MED ORDER — HYDROCHLOROTHIAZIDE 25 MG PO TABS
25.0000 mg | ORAL_TABLET | Freq: Every day | ORAL | Status: DC
  Filled 2014-01-10: qty 1

## 2014-01-10 MED ORDER — CYCLOBENZAPRINE HCL 10 MG PO TABS
10.0000 mg | ORAL_TABLET | Freq: Every day | ORAL | Status: DC
  Administered 2014-01-10 – 2014-01-16 (×7): 10 mg via ORAL
  Filled 2014-01-10 (×10): qty 1

## 2014-01-10 MED ORDER — FUROSEMIDE 20 MG PO TABS
20.0000 mg | ORAL_TABLET | Freq: Two times a day (BID) | ORAL | Status: DC
  Administered 2014-01-10 – 2014-01-16 (×10): 20 mg via ORAL
  Filled 2014-01-10 (×15): qty 1

## 2014-01-10 MED ORDER — INSULIN ASPART 100 UNIT/ML ~~LOC~~ SOLN
0.0000 [IU] | Freq: Every day | SUBCUTANEOUS | Status: DC
  Administered 2014-01-10: 2 [IU] via SUBCUTANEOUS
  Administered 2014-01-11: 3 [IU] via SUBCUTANEOUS

## 2014-01-10 MED ORDER — METOPROLOL TARTRATE 25 MG PO TABS
25.0000 mg | ORAL_TABLET | Freq: Two times a day (BID) | ORAL | Status: DC
  Administered 2014-01-10 – 2014-01-16 (×13): 25 mg via ORAL
  Filled 2014-01-10 (×14): qty 1

## 2014-01-10 MED ORDER — ASPIRIN 81 MG PO CHEW
324.0000 mg | CHEWABLE_TABLET | Freq: Once | ORAL | Status: AC
  Administered 2014-01-10: 324 mg via ORAL
  Filled 2014-01-10: qty 4

## 2014-01-10 MED ORDER — LEVOTHYROXINE SODIUM 50 MCG PO TABS
50.0000 ug | ORAL_TABLET | Freq: Every day | ORAL | Status: DC
  Administered 2014-01-10 – 2014-01-16 (×7): 50 ug via ORAL
  Filled 2014-01-10 (×8): qty 1

## 2014-01-10 MED ORDER — PANTOPRAZOLE SODIUM 40 MG PO TBEC
40.0000 mg | DELAYED_RELEASE_TABLET | Freq: Every day | ORAL | Status: DC
  Administered 2014-01-10 – 2014-01-16 (×7): 40 mg via ORAL
  Filled 2014-01-10 (×7): qty 1

## 2014-01-10 MED ORDER — INSULIN GLARGINE 100 UNIT/ML ~~LOC~~ SOLN
5.0000 [IU] | Freq: Every day | SUBCUTANEOUS | Status: DC
  Administered 2014-01-10: 5 [IU] via SUBCUTANEOUS
  Filled 2014-01-10 (×2): qty 0.05

## 2014-01-10 MED ORDER — MAGNESIUM SULFATE IN D5W 10-5 MG/ML-% IV SOLN
1.0000 g | Freq: Once | INTRAVENOUS | Status: AC
  Administered 2014-01-10: 1 g via INTRAVENOUS
  Filled 2014-01-10: qty 100

## 2014-01-10 MED ORDER — INSULIN ASPART PROT & ASPART (70-30 MIX) 100 UNIT/ML ~~LOC~~ SUSP
20.0000 [IU] | Freq: Two times a day (BID) | SUBCUTANEOUS | Status: DC
  Administered 2014-01-10: 20 [IU] via SUBCUTANEOUS
  Filled 2014-01-10: qty 10

## 2014-01-10 MED ORDER — SENNOSIDES-DOCUSATE SODIUM 8.6-50 MG PO TABS: 1.0000 | ORAL_TABLET | Freq: Every evening | ORAL | Status: DC | PRN

## 2014-01-10 MED ORDER — LATANOPROST 0.005 % OP SOLN
1.0000 [drp] | Freq: Every day | OPHTHALMIC | Status: DC
  Administered 2014-01-10 – 2014-01-15 (×5): 1 [drp] via OPHTHALMIC
  Filled 2014-01-10: qty 2.5

## 2014-01-10 MED ORDER — GLIMEPIRIDE 4 MG PO TABS
4.0000 mg | ORAL_TABLET | Freq: Every day | ORAL | Status: DC
Start: 2014-01-10 — End: 2014-01-13
  Administered 2014-01-10 – 2014-01-12 (×3): 4 mg via ORAL
  Filled 2014-01-10 (×5): qty 1

## 2014-01-10 MED ORDER — HYDRALAZINE HCL 10 MG PO TABS
10.0000 mg | ORAL_TABLET | Freq: Four times a day (QID) | ORAL | Status: DC
  Administered 2014-01-10 – 2014-01-16 (×24): 10 mg via ORAL
  Filled 2014-01-10 (×28): qty 1

## 2014-01-10 MED ORDER — OLOPATADINE HCL 0.1 % OP SOLN
1.0000 [drp] | Freq: Two times a day (BID) | OPHTHALMIC | Status: DC
  Administered 2014-01-10 – 2014-01-16 (×13): 1 [drp] via OPHTHALMIC
  Filled 2014-01-10: qty 5

## 2014-01-10 MED ORDER — POTASSIUM CHLORIDE CRYS ER 20 MEQ PO TBCR
20.0000 meq | EXTENDED_RELEASE_TABLET | Freq: Two times a day (BID) | ORAL | Status: DC
  Administered 2014-01-10 – 2014-01-16 (×13): 20 meq via ORAL
  Filled 2014-01-10 (×15): qty 1

## 2014-01-10 MED ORDER — ATORVASTATIN CALCIUM 20 MG PO TABS
20.0000 mg | ORAL_TABLET | Freq: Every day | ORAL | Status: DC
  Administered 2014-01-10 – 2014-01-15 (×6): 20 mg via ORAL
  Filled 2014-01-10 (×7): qty 1

## 2014-01-10 MED ORDER — INSULIN ASPART 100 UNIT/ML ~~LOC~~ SOLN
0.0000 [IU] | Freq: Three times a day (TID) | SUBCUTANEOUS | Status: DC
  Administered 2014-01-10 – 2014-01-11 (×3): 3 [IU] via SUBCUTANEOUS
  Administered 2014-01-12: 2 [IU] via SUBCUTANEOUS
  Administered 2014-01-13: 3 [IU] via SUBCUTANEOUS
  Administered 2014-01-15: 2 [IU] via SUBCUTANEOUS
  Administered 2014-01-15 – 2014-01-16 (×2): 3 [IU] via SUBCUTANEOUS
  Administered 2014-01-16: 2 [IU] via SUBCUTANEOUS

## 2014-01-10 MED ORDER — POTASSIUM CHLORIDE CRYS ER 20 MEQ PO TBCR
40.0000 meq | EXTENDED_RELEASE_TABLET | Freq: Once | ORAL | Status: AC
  Administered 2014-01-10: 40 meq via ORAL
  Filled 2014-01-10: qty 2

## 2014-01-10 MED ORDER — ENOXAPARIN SODIUM 30 MG/0.3ML ~~LOC~~ SOLN
30.0000 mg | Freq: Every day | SUBCUTANEOUS | Status: DC
  Administered 2014-01-10 – 2014-01-16 (×7): 30 mg via SUBCUTANEOUS
  Filled 2014-01-10 (×7): qty 0.3

## 2014-01-10 NOTE — H&P (Signed)
[facility] History and Physical  [name] [name] ZOX:[id number] DOB: 26-Jan-1941 DOA: 01/09/2014  Referring physician: EDP PCP: Alva Garnet., MD  Specialists:   Chief Complaint:   HPI: [name] [name] is a 73 y.o. female with a history of a CVA in 2002, HTN, DM2, and PVD who presents to the Ed with complaints of sudden onset of feeling dizzy and sluggish and then almost falling instead she slid out of her chair to the floor.   She had increased weakness of her left side and her symptoms started at around 7:30pm.   She denied having any Headache or Chest pain.   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   She was referred for medical admission.      Review of Systems:  Constitutional: No Weight Loss, No Weight Gain, Night Sweats, Fevers, Chills, +Dizziness, Fatigue, or Generalized Weakness HEENT: No Headaches, Difficulty Swallowing,Tooth/Dental Problems,Sore Throat,  No Sneezing, Rhinitis, Ear Ache, Nasal Congestion, or Post Nasal Drip,  Cardio-vascular:  No Chest pain, Orthopnea, PND, Edema in lower extremities, Anasarca, Dizziness, Palpitations  Resp: No Dyspnea, No DOE, No Productive Cough, No Non-Productive Cough, No Hemoptysis, No Change in Color of Mucus,  No Wheezing.    GI: No Heartburn, Indigestion, Abdominal Pain, Nausea, Vomiting, Diarrhea, Change in Bowel Habits,  Loss of Appetite  GU: No Dysuria, Change in Color of Urine, No Urgency or Frequency.  No flank pain.  Musculoskeletal: No Joint Pain or Swelling.  No Decreased Range of Motion. No Back Pain.  Neurologic: No Syncope, No Seizures, +Weakness, Paresthesia, Vision Disturbance or Loss, No Diplopia,  +Vertigo,  +Difficulty Walking,  Skin: No Rash or Lesions. Psych: No Change in Mood or Affect. No Depression or Anxiety. No Memory loss. No Confusion or  Hallucinations   Past Medical History  Diagnosis Date  . Hypertension   . Diabetes mellitus   . Stroke   . Chronic renal insufficiency   . Hyperlipidemia   . Osteopenia   . CVA (cerebral infarction)       Past Surgical History  Procedure Laterality Date  . Eye surgery         Prior to Admission medications   Medication Sig Start Date End Date Taking? Authorizing Provider  aspirin EC 81 MG tablet Take 81 mg by mouth daily.   Yes Historical Provider, MD  cyclobenzaprine (FLEXERIL) 10 MG tablet Take 10 mg by mouth daily.   Yes Historical Provider, MD  furosemide (LASIX) 20 MG tablet Take 20 mg by mouth 2 (two) times daily.   Yes Historical Provider, MD  gabapentin (NEURONTIN) 300 MG capsule Take 300 mg by mouth daily.   Yes Historical Provider, MD  glimepiride (AMARYL) 4 MG tablet Take 4 mg by mouth daily before breakfast.   Yes Historical Provider, MD  hydrALAZINE (APRESOLINE) 10 MG tablet Take 1 tablet (10 mg total) by mouth 4 (four) times daily. 10/31/13  Yes Flint Melter, MD  hydrochlorothiazide (HYDRODIURIL) 25 MG tablet Take 25 mg by mouth daily.   Yes Historical Provider, MD  insulin NPH-regular (NOVOLIN 70/30) (70-30) 100 UNIT/ML injection Inject 40 Units into the skin daily as needed. Blood sugar >200 take insulin   Yes Historical Provider, MD  levothyroxine (SYNTHROID, LEVOTHROID) 50 MCG tablet Take 50 mcg by mouth daily.   Yes Historical Provider, MD  lisinopril (  PRINIVIL,ZESTRIL) 20 MG tablet Take 20 mg by mouth daily.   Yes Historical Provider, MD  LUMIGAN 0.01 % SOLN Place 1 drop into both eyes at bedtime.  11/01/13  Yes Historical Provider, MD  metFORMIN (GLUCOPHAGE) 1000 MG tablet Take 1,000 mg by mouth 2 (two) times daily with a meal.   Yes Historical Provider, MD  metoprolol succinate (TOPROL-XL) 25 MG 24 hr tablet Take 25 mg by mouth daily.  12/26/13  Yes Historical Provider, MD  metoprolol tartrate (LOPRESSOR) 25 MG tablet Take 25 mg by mouth 2 (two) times  daily.   Yes Historical Provider, MD  naproxen (NAPROSYN) 500 MG tablet Take 1 tablet by mouth 2 (two) times daily. 03/01/13  Yes Historical Provider, MD  omeprazole (PRILOSEC) 40 MG capsule Take 40 mg by mouth daily.   Yes Historical Provider, MD  PATADAY 0.2 % SOLN Place 1 drop into both eyes daily.  11/29/13  Yes Historical Provider, MD  potassium chloride SA (K-DUR,KLOR-CON) 20 MEQ tablet Take 20 mEq by mouth 2 (two) times daily.   Yes Historical Provider, MD      Allergies  Allergen Reactions  . Erythromycin   . Iodine     REACTION: hives  . Penicillins     REACTION: hives     Social History:  reports that she has been smoking Cigarettes.  She has a 30 pack-year smoking history. She does not have any smokeless tobacco history on file. She reports that she drinks alcohol. She reports that she does not use illicit drugs.     Family History  Problem Relation Age of Onset  . Diabetes Mother   . Hypertension Mother        Physical Exam:  GEN:  Pleasant  Elderly Well nourished and Well Developed  73 y.o. African American female  examined  and in no acute distress; cooperative with exam Filed Vitals:   01/10/14 0034 01/10/14 0248 01/10/14 0300 01/10/14 0422  BP: 150/81 107/61  144/46  Pulse: 58 65  66  Temp:  97.9 F (36.6 C)  98.2 F (36.8 C)  TempSrc:    Oral  Resp: 19 20  20   Height:   5\' 2"  (1.575 m)   Weight:   87.98 kg (193 lb 15.4 oz)   SpO2: 98% 100%  99%   Blood pressure 144/46, pulse 66, temperature 98.2 F (36.8 C), temperature source Oral, resp. rate 20, height 5\' 2"  (1.575 m), weight 87.98 kg (193 lb 15.4 oz), SpO2 99.00%. PSYCH: She is alert and oriented x4; does not appear anxious does not appear depressed; affect is normal HEENT: Normocephalic and Atraumatic, Mucous membranes pink; PERRLA; EOM intact; Fundi:  Benign;  No scleral icterus, Nares: Patent, Oropharynx: Clear, Edentulous, Neck:  FROM, no cervical lymphadenopathy nor thyromegaly or carotid bruit;  no JVD; Breasts:: Not examined CHEST WALL: No tenderness CHEST: Normal respiration, clear to auscultation bilaterally HEART: Regular rate and rhythm; no murmurs rubs or gallops BACK: No kyphosis or scoliosis; no CVA tenderness ABDOMEN: Positive Bowel Sounds, soft non-tender; no masses, no organomegaly. Rectal Exam: Not done EXTREMITIES: No cyanosis, clubbing or edema; no ulcerations. Genitalia: not examined PULSES: 2+ and symmetric SKIN: Normal hydration no rash or ulceration CNS:  Mental Status:  Alert, oriented, thought content appropriate. Speech fluent without evidence of aphasia. Able to follow 3 step commands without difficulty. In No obvious pain.  Cranial Nerves:  II: Discs flat bilaterally; Visual fields Intact, Pupils equal and reactive.  III,IV, VI:  extra-ocular motions intact  bilaterally  V,VII: +Left Facial Droop Facial Sensation normal bilaterally  VIII: hearing decreasesd bilaterally  IX,X: gag reflex present  XI: bilateral shoulder shrug  XII: midline tongue extension  Motor:  Right: Upper extremity 4/5 Left: Upper extremity 5/5  Right Lower extremity 4/5 Left Lower extremity 5/5  Tone and bulk:normal tone throughout; no atrophy noted  Sensory: Pinprick and light touch intact throughout, bilaterally  Deep Tendon Reflexes: 2+ and symmetric throughout  Plantars/ Babinski: Right: equivocal or upgoing or normal Left: equivocal or upgoing or normal   Cerebellar: Finger to nose without difficulty.  Gait: deferred  Vascular: pulses palpable throughout    Labs on Admission:  Basic Metabolic Panel:  Recent Labs Lab 01/09/14 2016 01/09/14 2042  NA 142 140  K 3.2* 3.1*  CL 97 97  CO2 31  --   GLUCOSE 80 77  BUN 24* 24*  CREATININE 1.45* 1.60*  CALCIUM 9.5  --    Liver Function Tests:  Recent Labs Lab 01/09/14 2016  AST 13  ALT 6  ALKPHOS 88  BILITOT 0.2*  PROT 7.9  ALBUMIN 4.0   No results found for this basename: LIPASE, AMYLASE,  in the last 168  hours No results found for this basename: AMMONIA,  in the last 168 hours CBC:  Recent Labs Lab 01/09/14 2016 01/09/14 2042  WBC 6.4  --   NEUTROABS 3.8  --   HGB 13.6 14.6  HCT 38.0 43.0  MCV 85.8  --   PLT 255  --    Cardiac Enzymes: No results found for this basename: CKTOTAL, CKMB, CKMBINDEX, TROPONINI,  in the last 168 hours  BNP (last 3 results) No results found for this basename: PROBNP,  in the last 8760 hours CBG: No results found for this basename: GLUCAP,  in the last 168 hours  Radiological Exams on Admission: Mr Angiogram Head Wo Contrast  01/10/2014   CLINICAL DATA:  Dizziness. Evaluate for posterior circulation stroke.  EXAM: MRI HEAD WITHOUT CONTRAST  MRA HEAD WITHOUT CONTRAST  TECHNIQUE: Multiplanar, multiecho pulse sequences of the brain and surrounding structures were obtained without intravenous contrast. Angiographic images of the head were obtained using MRA technique without contrast.  COMPARISON:  None available.  FINDINGS: MRI HEAD FINDINGS  Diffuse prominence of the CSF containing spaces is compatible with mild age-related atrophy. Scattered and confluent T2/FLAIR hyperintensity within the periventricular and deep white matter both cerebral hemispheres is compatible with chronic small vessel ischemic changes. Similar changes are seen within the pons. Scattered remote cerebellar infarcts are noted bilaterally, most evident within the and left cerebellar hemisphere (series 10, image 17). Additional scattered remote lacunar infarcts seen within the periventricular and deep white matter both cerebral hemispheres bilaterally, most evident adjacent to the anterior horn of the left lateral ventricle (series 8, image 13). Remote lacunar infarcts within the bilateral basal ganglia also noted.  Single tiny focus of restricted diffusion measuring 5 mm is seen within the periatrial white matter of the left frontoparietal region (series 3, image 19). No other abnormal foci of  restricted diffusion are seen. No evidence of acute intracranial hemorrhage. Multiple scattered subcentimeter hypo intense foci seen on gradient echo sequence within the supratentorial and infratentorial brain likely represent small chronic micro hemorrhages, which may be related to hypertension.  No mass lesion, midline shift, or extra-axial fluid collection. Ventricles are normal in size without evidence of hydrocephalus.  No diffusion-weighted signal abnormality is identified to suggest acute intracranial infarct. Gray-white matter differentiation is maintained. Normal  flow voids are seen within the intracranial vasculature. No intracranial hemorrhage identified.  The cervicomedullary junction is normal. Pituitary gland is within normal limits. Pituitary stalk is midline. The globes and optic nerves demonstrate a normal appearance with normal signal intensity. The  The bone marrow signal intensity is normal. Calvarium is intact. Visualized upper cervical spine is within normal limits.  Scalp soft tissues are unremarkable.  Paranasal sinuses are clear.  No mastoid effusion.  MRA HEAD FINDINGS  Anterior circulation: Normal flow related enhancement of the included cervical internal carotid arteries. The petrous, cavernous, and supra clinoid segments are widely patent bilaterally with antegrade flow. Patent anterior communicating artery. Normal flow related enhancement of the anterior and middle cerebral arteries, including more distal segments. Multi focal irregularity seen within the M1 segments bilaterally, likely related to underlying atheromatous disease.  Posterior circulation: Right Vertebral artery is dominant. Short-segment stenosis of at least 70% is seen within the distal left vertebral artery just prior to the vertebrobasilar junction. Basilar artery is patent, with normal flow related enhancement of the main branch vessels. Normal flow related enhancement of the posterior cerebral arteries. Short segment  stenosis of approximately 50% seen within the right P2 segment.  No large vessel occlusion, hemodynamically significant stenosis, abnormal luminal irregularity, aneurysm within the anterior nor posterior circulation.  IMPRESSION: MRI HEAD:  1. 5 mm acute ischemic infarct within the left periatrial white matter as above. 2. Remote infarcts involving the bilateral cerebellar hemispheres, with additional scattered remote lacunar infarcts involving the bilateral basal ganglia and supratentorial white matter. 3. Moderate atrophy and chronic microvascular ischemic disease within the supratentorial white matter and pons.  MRA HEAD:  1. 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 The right vertebral artery is dominant. 2. Short-segment stenosis of approximately 50% within the right P2 segment. 3. No other high-grade flow-limiting stenosis or proximal branch occlusion identified. 4. No intracranial aneurysm.   Electronically Signed   By: Rise Mu M.D.   On: 01/10/2014 00:01   Mr Brain Wo Contrast  01/10/2014   CLINICAL DATA:  Dizziness. Evaluate for posterior circulation stroke.  EXAM: MRI HEAD WITHOUT CONTRAST  MRA HEAD WITHOUT CONTRAST  TECHNIQUE: Multiplanar, multiecho pulse sequences of the brain and surrounding structures were obtained without intravenous contrast. Angiographic images of the head were obtained using MRA technique without contrast.  COMPARISON:  None available.  FINDINGS: MRI HEAD FINDINGS  Diffuse prominence of the CSF containing spaces is compatible with mild age-related atrophy. Scattered and confluent T2/FLAIR hyperintensity within the periventricular and deep white matter both cerebral hemispheres is compatible with chronic small vessel ischemic changes. Similar changes are seen within the pons. Scattered remote cerebellar infarcts are noted bilaterally, most evident within the and left cerebellar hemisphere  (series 10, image 17). Additional scattered remote lacunar infarcts seen within the periventricular and deep white matter both cerebral hemispheres bilaterally, most evident adjacent to the anterior horn of the left lateral ventricle (series 8, image 13). Remote lacunar infarcts within the bilateral basal ganglia also noted.  Single tiny focus of restricted diffusion measuring 5 mm is seen within the periatrial white matter of the left frontoparietal region (series 3, image 19). No other abnormal foci of restricted diffusion are seen. No evidence of acute intracranial hemorrhage. Multiple scattered subcentimeter hypo intense foci seen on gradient echo sequence within the supratentorial and infratentorial brain likely represent small chronic micro hemorrhages, which may be related to hypertension.  No mass lesion, midline shift, or extra-axial fluid collection. Ventricles are normal in size without evidence of hydrocephalus.  No diffusion-weighted signal abnormality is identified to suggest acute intracranial infarct. Gray-white matter differentiation is maintained. Normal flow voids are seen within the intracranial vasculature. No intracranial hemorrhage identified.  The cervicomedullary junction is normal. Pituitary gland is within normal limits. Pituitary stalk is midline. The globes and optic nerves demonstrate a normal appearance with normal signal intensity. The  The bone marrow signal intensity is normal. Calvarium is intact. Visualized upper cervical spine is within normal limits.  Scalp soft tissues are unremarkable.  Paranasal sinuses are clear.  No mastoid effusion.  MRA HEAD FINDINGS  Anterior circulation: Normal flow related enhancement of the included cervical internal carotid arteries. The petrous, cavernous, and supra clinoid segments are widely patent bilaterally with antegrade flow. Patent anterior communicating artery. Normal flow related enhancement of the anterior and middle cerebral arteries,  including more distal segments. Multi focal irregularity seen within the M1 segments bilaterally, likely related to underlying atheromatous disease.  Posterior circulation: Right Vertebral artery is dominant. Short-segment stenosis of at least 70% is seen within the distal left vertebral artery just prior to the vertebrobasilar junction. Basilar artery is patent, with normal flow related enhancement of the main branch vessels. Normal flow related enhancement of the posterior cerebral arteries. Short segment stenosis of approximately 50% seen within the right P2 segment.  No large vessel occlusion, hemodynamically significant stenosis, abnormal luminal irregularity, aneurysm within the anterior nor posterior circulation.  IMPRESSION: MRI HEAD:  1. 5 mm acute ischemic infarct within the left periatrial white matter as above. 2. Remote infarcts involving the bilateral cerebellar hemispheres, with additional scattered remote lacunar infarcts involving the bilateral basal ganglia and supratentorial white matter. 3. Moderate atrophy and chronic microvascular ischemic disease within the supratentorial white matter and pons.  MRA HEAD:  1. 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 The right vertebral artery is dominant. 2. Short-segment stenosis of approximately 50% within the right P2 segment. 3. No other high-grade flow-limiting stenosis or proximal branch occlusion identified. 4. No intracranial aneurysm.   Electronically Signed   By: Rise Mu M.D.   On: 01/10/2014 00:01      EKG: Independently reviewed. Sinus Rhythm,  T-Wave Inversion in Inferior Leads, Seen on Previous EKG.      Assessment/Plan:   73 y.o. female with  Principal Problem:   CVA (cerebral infarction) Active Problems:   Dizziness   Fall   Hypertension   Hyperlipidemia   Type II or unspecified type diabetes mellitus without mention of complication,  not stated as uncontrolled   Peripheral vascular disease, unspecified   Hypokalemia      1.   CVA-  On MRI/MRA studies doen 03/06,  Admitted for further evaluation and risk stratification.  Seen by Neuro in ED. Placed on ASA Rx, Not a TPA candidate.  PT.OT and Speech consults per CVA protocol. Check   Fasting Lipids, and HbA1c.    2.   Fall-  Due to #1,  And weakness, also has an unsteady gait, PT consult.    3.   Dizziness-  Acute on chronic, has hx of previous Cerebellar CVA.    4.   HTN-  Continue Metoprolol, LIsinopril, ,Lasix, and Hydralazine Rx.   Monitor BPs.    5.   Hyperlipidemia -  continue    And check fasting lipids in AM.  6.   DM2-  Check HbA1c, add SSI coverage PRN, and continue Glimepiride Rx but hold Metformin due to contrast Studies.     7.   PVD- due to #7.  On Gabapentin rx.    8.   Hypokalemia- due to Lasix Rx, Replete K+.     9.   DVT prophylaxis with Lovenox.       Code Status:   FULL CODE Family Communication:    Family at Bedside Disposition Plan:   Inpatient   Time spent:  7460 Minutes  Ron ParkerJENKINS,Diann Bangerter C [facility] Pager [phone]  If 7PM-7AM, please contact night-coverage [email or web address] Password [email or web address] 01/10/2014, 7:21 AM

## 2014-01-10 NOTE — Progress Notes (Signed)
Echocardiogram 2D Echocardiogram has been performed.  Estelle GrumblesMyers, Macalister Arnaud J 01/10/2014, 1:40 PM

## 2014-01-10 NOTE — Progress Notes (Signed)
PROGRESS NOTE  [name] FullingBetty J [name] ZOX:[id number] DOB: 1941-07-25 DOA: 01/09/2014 PCP: Alva GarnetSHELTON,KIMBERLY R., MD  Assessment/Plan: CVA-  MRI/MRA studies done 03/06, Seen by Neuro in ED.  Placed on ASA , Not a TPA candidate.  PT.OT and Speech consults   Fasting Lipids:LDL 141- add statin HbA1c.   Fall- Due to #1, And weakness, also has an unsteady gait, PT consult.   Dizziness- Acute on chronic, has hx of previous Cerebellar CVA.    HTN- Continue Metoprolol, LIsinopril, ,Lasix, and Hydralazine Rx. Monitor BPs.   Hyperlipidemia - add statin.   DM2- Check HbA1c, add SSI coverage PRN, on 70/30 at home???  PVD- On Gabapentin rx.   Hypokalemia- due to Lasix Rx, Replete K+,. Check Mg- bmp in AM  CKD- baseline 1.4  Code Status: full Family Communication: patient Disposition Plan:    Consultants:  neuro  Procedures:    Antibiotics:    HPI/Subjective: No complaints this AM Wants to go home  Objective: Filed Vitals:   01/10/14 1003  BP: 139/66  Pulse: 72  Temp: 98.1 F (36.7 C)  Resp: 20    Intake/Output Summary (Last 24 hours) at 01/10/14 1025 Last data filed at 01/10/14 1002  Gross per 24 hour  Intake    240 ml  Output      0 ml  Net    240 ml   Filed Weights   01/10/14 0300  Weight: 87.98 kg (193 lb 15.4 oz)    Exam:   General:  cooperative  Cardiovascular: rrr  Respiratory: clear anterior  Abdomen: +Bs, soft  Musculoskeletal: moves all 4 ext   Data Reviewed: Basic Metabolic Panel:  Recent Labs Lab 01/09/14 2016 01/09/14 2042  NA 142 140  K 3.2* 3.1*  CL 97 97  CO2 31  --   GLUCOSE 80 77  BUN 24* 24*  CREATININE 1.45* 1.60*  CALCIUM 9.5  --    Liver Function Tests:  Recent Labs Lab 01/09/14 2016  AST 13  ALT 6  ALKPHOS 88  BILITOT 0.2*  PROT 7.9  ALBUMIN 4.0   No results found for this basename: LIPASE, AMYLASE,  in the last 168 hours No results found for this basename: AMMONIA,  in the last 168 hours CBC:  Recent  Labs Lab 01/09/14 2016 01/09/14 2042  WBC 6.4  --   NEUTROABS 3.8  --   HGB 13.6 14.6  HCT 38.0 43.0  MCV 85.8  --   PLT 255  --    Cardiac Enzymes: No results found for this basename: CKTOTAL, CKMB, CKMBINDEX, TROPONINI,  in the last 168 hours BNP (last 3 results) No results found for this basename: PROBNP,  in the last 8760 hours CBG:  Recent Labs Lab 01/10/14 0907  GLUCAP 242*    No results found for this or any previous visit (from the past 240 hour(s)).   Studies: Dg Chest 2 View  01/10/2014   CLINICAL DATA:  Cerebral infarction and weakness.  EXAM: CHEST - 2 VIEW  COMPARISON:  DG CHEST 2 VIEW dated 10/31/2013; MR HEAD W/O CM dated 01/09/2014; DG CHEST 2 VIEW dated 06/29/2013  FINDINGS: The heart size and mediastinal contours are within normal limits. There is no evidence of pulmonary edema, consolidation, pneumothorax, nodule or pleural fluid. The visualized skeletal structures show mild degenerative changes of the thoracic spine.  IMPRESSION: No active disease.   Electronically Signed   By: Irish LackGlenn  Yamagata M.D.   On: 01/10/2014 09:03   Mr Angiogram Head Wo Contrast  01/10/2014   CLINICAL DATA:  Dizziness. Evaluate for posterior circulation stroke.  EXAM: MRI HEAD WITHOUT CONTRAST  MRA HEAD WITHOUT CONTRAST  TECHNIQUE: Multiplanar, multiecho pulse sequences of the brain and surrounding structures were obtained without intravenous contrast. Angiographic images of the head were obtained using MRA technique without contrast.  COMPARISON:  None available.  FINDINGS: MRI HEAD FINDINGS  Diffuse prominence of the CSF containing spaces is compatible with mild age-related atrophy. Scattered and confluent T2/FLAIR hyperintensity within the periventricular and deep white matter both cerebral hemispheres is compatible with chronic small vessel ischemic changes. Similar changes are seen within the pons. Scattered remote cerebellar infarcts are noted bilaterally, most evident within the and left  cerebellar hemisphere (series 10, image 17). Additional scattered remote lacunar infarcts seen within the periventricular and deep white matter both cerebral hemispheres bilaterally, most evident adjacent to the anterior horn of the left lateral ventricle (series 8, image 13). Remote lacunar infarcts within the bilateral basal ganglia also noted.  Single tiny focus of restricted diffusion measuring 5 mm is seen within the periatrial white matter of the left frontoparietal region (series 3, image 19). No other abnormal foci of restricted diffusion are seen. No evidence of acute intracranial hemorrhage. Multiple scattered subcentimeter hypo intense foci seen on gradient echo sequence within the supratentorial and infratentorial brain likely represent small chronic micro hemorrhages, which may be related to hypertension.  No mass lesion, midline shift, or extra-axial fluid collection. Ventricles are normal in size without evidence of hydrocephalus.  No diffusion-weighted signal abnormality is identified to suggest acute intracranial infarct. Gray-white matter differentiation is maintained. Normal flow voids are seen within the intracranial vasculature. No intracranial hemorrhage identified.  The cervicomedullary junction is normal. Pituitary gland is within normal limits. Pituitary stalk is midline. The globes and optic nerves demonstrate a normal appearance with normal signal intensity. The  The bone marrow signal intensity is normal. Calvarium is intact. Visualized upper cervical spine is within normal limits.  Scalp soft tissues are unremarkable.  Paranasal sinuses are clear.  No mastoid effusion.  MRA HEAD FINDINGS  Anterior circulation: Normal flow related enhancement of the included cervical internal carotid arteries. The petrous, cavernous, and supra clinoid segments are widely patent bilaterally with antegrade flow. Patent anterior communicating artery. Normal flow related enhancement of the anterior and middle  cerebral arteries, including more distal segments. Multi focal irregularity seen within the M1 segments bilaterally, likely related to underlying atheromatous disease.  Posterior circulation: Right Vertebral artery is dominant. Short-segment stenosis of at least 70% is seen within the distal left vertebral artery just prior to the vertebrobasilar junction. Basilar artery is patent, with normal flow related enhancement of the main branch vessels. Normal flow related enhancement of the posterior cerebral arteries. Short segment stenosis of approximately 50% seen within the right P2 segment.  No large vessel occlusion, hemodynamically significant stenosis, abnormal luminal irregularity, aneurysm within the anterior nor posterior circulation.  IMPRESSION: MRI HEAD:  1. 5 mm acute ischemic infarct within the left periatrial white matter as above. 2. Remote infarcts involving the bilateral cerebellar hemispheres, with additional scattered remote lacunar infarcts involving the bilateral basal ganglia and supratentorial white matter. 3. Moderate atrophy and chronic microvascular ischemic disease within the supratentorial white matter and pons.  MRA HEAD:  1. 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 The right vertebral artery is dominant. 2. Short-segment stenosis of approximately 50% within the right P2 segment. 3. No  other high-grade flow-limiting stenosis or proximal branch occlusion identified. 4. No intracranial aneurysm.   Electronically Signed   By: Rise Mu M.D.   On: 01/10/2014 00:01   Mr Brain Wo Contrast  01/10/2014   CLINICAL DATA:  Dizziness. Evaluate for posterior circulation stroke.  EXAM: MRI HEAD WITHOUT CONTRAST  MRA HEAD WITHOUT CONTRAST  TECHNIQUE: Multiplanar, multiecho pulse sequences of the brain and surrounding structures were obtained without intravenous contrast. Angiographic images of the head were  obtained using MRA technique without contrast.  COMPARISON:  None available.  FINDINGS: MRI HEAD FINDINGS  Diffuse prominence of the CSF containing spaces is compatible with mild age-related atrophy. Scattered and confluent T2/FLAIR hyperintensity within the periventricular and deep white matter both cerebral hemispheres is compatible with chronic small vessel ischemic changes. Similar changes are seen within the pons. Scattered remote cerebellar infarcts are noted bilaterally, most evident within the and left cerebellar hemisphere (series 10, image 17). Additional scattered remote lacunar infarcts seen within the periventricular and deep white matter both cerebral hemispheres bilaterally, most evident adjacent to the anterior horn of the left lateral ventricle (series 8, image 13). Remote lacunar infarcts within the bilateral basal ganglia also noted.  Single tiny focus of restricted diffusion measuring 5 mm is seen within the periatrial white matter of the left frontoparietal region (series 3, image 19). No other abnormal foci of restricted diffusion are seen. No evidence of acute intracranial hemorrhage. Multiple scattered subcentimeter hypo intense foci seen on gradient echo sequence within the supratentorial and infratentorial brain likely represent small chronic micro hemorrhages, which may be related to hypertension.  No mass lesion, midline shift, or extra-axial fluid collection. Ventricles are normal in size without evidence of hydrocephalus.  No diffusion-weighted signal abnormality is identified to suggest acute intracranial infarct. Gray-white matter differentiation is maintained. Normal flow voids are seen within the intracranial vasculature. No intracranial hemorrhage identified.  The cervicomedullary junction is normal. Pituitary gland is within normal limits. Pituitary stalk is midline. The globes and optic nerves demonstrate a normal appearance with normal signal intensity. The  The bone marrow  signal intensity is normal. Calvarium is intact. Visualized upper cervical spine is within normal limits.  Scalp soft tissues are unremarkable.  Paranasal sinuses are clear.  No mastoid effusion.  MRA HEAD FINDINGS  Anterior circulation: Normal flow related enhancement of the included cervical internal carotid arteries. The petrous, cavernous, and supra clinoid segments are widely patent bilaterally with antegrade flow. Patent anterior communicating artery. Normal flow related enhancement of the anterior and middle cerebral arteries, including more distal segments. Multi focal irregularity seen within the M1 segments bilaterally, likely related to underlying atheromatous disease.  Posterior circulation: Right Vertebral artery is dominant. Short-segment stenosis of at least 70% is seen within the distal left vertebral artery just prior to the vertebrobasilar junction. Basilar artery is patent, with normal flow related enhancement of the main branch vessels. Normal flow related enhancement of the posterior cerebral arteries. Short segment stenosis of approximately 50% seen within the right P2 segment.  No large vessel occlusion, hemodynamically significant stenosis, abnormal luminal irregularity, aneurysm within the anterior nor posterior circulation.  IMPRESSION: MRI HEAD:  1. 5 mm acute ischemic infarct within the left periatrial white matter as above. 2. Remote infarcts involving the bilateral cerebellar hemispheres, with additional scattered remote lacunar infarcts involving the bilateral basal ganglia and supratentorial white matter. 3. Moderate atrophy and chronic microvascular ischemic disease within the supratentorial white matter and pons.  MRA HEAD:  1. Short segment  stenosis of at least 70% within the distal left vertebral artery just prior to the vertebrobasilar junction. The right vertebral artery is dominant. 2. Short-segment stenosis of approximately 50% within the right P2 segment. 3. No other  high-grade flow-limiting stenosis or proximal branch occlusion identified. 4. No intracranial aneurysm.   Electronically Signed   By: Rise Mu M.D.   On: 01/10/2014 00:01    Scheduled Meds: . cyclobenzaprine  10 mg Oral Daily  . enoxaparin (LOVENOX) injection  30 mg Subcutaneous Daily  . furosemide  20 mg Oral BID  . gabapentin  300 mg Oral Daily  . glimepiride  4 mg Oral QAC breakfast  . hydrALAZINE  10 mg Oral QID  . hydrochlorothiazide  25 mg Oral Daily  . insulin aspart  0-15 Units Subcutaneous TID WC  . insulin aspart  0-5 Units Subcutaneous QHS  . insulin aspart protamine- aspart  20 Units Subcutaneous BID WC  . latanoprost  1 drop Both Eyes QHS  . levothyroxine  50 mcg Oral QAC breakfast  . lisinopril  20 mg Oral Daily  . metoprolol tartrate  25 mg Oral BID  . olopatadine  1 drop Both Eyes BID  . pantoprazole  40 mg Oral Daily  . potassium chloride SA  20 mEq Oral BID   Continuous Infusions:  Antibiotics Given (last 72 hours)   None      Principal Problem:   CVA (cerebral infarction) Active Problems:   Peripheral vascular disease, unspecified   Dizziness   Fall   Hypertension   Hyperlipidemia   Type II or unspecified type diabetes mellitus without mention of complication, not stated as uncontrolled   Hypokalemia    Time spent: 35 min   Macgregor Aeschliman  [facility] Pager [phone]. If 7PM-7AM, please contact night-coverage at [email or web address], password [facility] 01/10/2014, 10:25 AM  LOS: 1 day

## 2014-01-10 NOTE — ED Provider Notes (Signed)
I have personally seen and examined the patient.  I have discussed the plan of care with the resident.  I have reviewed the documentation on PMH/FH/Soc. History.  I have reviewed the documentation of the resident and agree.  Doug SouSam Sumiko Ceasar, MD 01/10/14 [phone]

## 2014-01-10 NOTE — Progress Notes (Signed)
Stroke Team Progress Note  HISTORY [name] [name] is a 73 y.o. female with a history of hypertension, diabetes, and previous stroke who was in her normal state of health until about 7:30 PM on 01/09/2014.  At that time, she felt lightheaded and felt generally weak and slid down to the floor. Her husband arrived and called 911. On arrival, she complained of dizziness, but not true vertigo. There was some concern for stroke and therefore an MRI was obtained which showed a small left parietal infarct. She stated that her symptoms were greatly improved compared to when they first started. She denied any new numbness or focal weakness. She had left hip pain that prevented her from raising that leg.  LKW: 7:30 PM  01/09/2014 tpa given?: no, mild symptoms, resolving symptoms   SUBJECTIVE The patient's husband was at the bedside. The patient voices no complaints.  OBJECTIVE Most recent Vital Signs: Filed Vitals:   01/10/14 0300 01/10/14 0422 01/10/14 0806 01/10/14 1003  BP:  144/46 162/55 139/66  Pulse:  66 69 72  Temp:  98.2 F (36.8 C) 98.1 F (36.7 C) 98.1 F (36.7 C)  TempSrc:  Oral Oral Oral  Resp:  20 20 20   Height: 5\' 2"  (1.575 m)     Weight: 87.98 kg (193 lb 15.4 oz)     SpO2:  99% 99% 100%   CBG (last 3)   Recent Labs  01/10/14 0907  GLUCAP 242*    IV Fluid Intake:     MEDICATIONS  . atorvastatin  20 mg Oral q1800  . cyclobenzaprine  10 mg Oral Daily  . enoxaparin (LOVENOX) injection  30 mg Subcutaneous Daily  . furosemide  20 mg Oral BID  . gabapentin  300 mg Oral Daily  . glimepiride  4 mg Oral QAC breakfast  . hydrALAZINE  10 mg Oral QID  . insulin aspart  0-15 Units Subcutaneous TID WC  . insulin aspart  0-5 Units Subcutaneous QHS  . insulin aspart protamine- aspart  20 Units Subcutaneous BID WC  . latanoprost  1 drop Both Eyes QHS  . levothyroxine  50 mcg Oral QAC breakfast  . lisinopril  20 mg Oral Daily  . metoprolol tartrate  25 mg Oral BID  . olopatadine   1 drop Both Eyes BID  . pantoprazole  40 mg Oral Daily  . potassium chloride SA  20 mEq Oral BID   PRN:  senna-docusate  Diet:  Carb Control thin liquids Activity:  Bedrest DVT Prophylaxis:  Lovenox  CLINICALLY SIGNIFICANT STUDIES Basic Metabolic Panel:  Recent Labs Lab 01/09/14 2016 01/09/14 2042  NA 142 140  K 3.2* 3.1*  CL 97 97  CO2 31  --   GLUCOSE 80 77  BUN 24* 24*  CREATININE 1.45* 1.60*  CALCIUM 9.5  --    Liver Function Tests:  Recent Labs Lab 01/09/14 2016  AST 13  ALT 6  ALKPHOS 88  BILITOT 0.2*  PROT 7.9  ALBUMIN 4.0   CBC:  Recent Labs Lab 01/09/14 2016 01/09/14 2042  WBC 6.4  --   NEUTROABS 3.8  --   HGB 13.6 14.6  HCT 38.0 43.0  MCV 85.8  --   PLT 255  --    Coagulation:  Recent Labs Lab 01/09/14 2016  LABPROT 13.1  INR 1.01   Cardiac Enzymes: No results found for this basename: CKTOTAL, CKMB, CKMBINDEX, TROPONINI,  in the last 168 hours Urinalysis:  Recent Labs Lab 01/09/14 2055  COLORURINE YELLOW  LABSPEC 1.017  PHURINE 6.5  GLUCOSEU >1000*  HGBUR NEGATIVE  BILIRUBINUR NEGATIVE  KETONESUR NEGATIVE  PROTEINUR 30*  UROBILINOGEN 0.2  NITRITE NEGATIVE  LEUKOCYTESUR NEGATIVE   Lipid Panel    Component Value Date/Time   CHOL 229* 01/10/2014 0520   TRIG 215* 01/10/2014 0520   HDL 45 01/10/2014 0520   CHOLHDL 5.1 01/10/2014 0520   VLDL 43* 01/10/2014 0520   LDLCALC 141* 01/10/2014 0520   HgbA1C  Lab Results  Component Value Date   HGBA1C  Value: 7.8 (NOTE)                                                                       According to the ADA Clinical Practice Recommendations for 2011, when HbA1c is used as a screening test:   >=6.5%   Diagnostic of Diabetes Mellitus           (if abnormal result  is confirmed)  5.7-6.4%   Increased risk of developing Diabetes Mellitus  References:Diagnosis and Classification of Diabetes Mellitus,Diabetes Care,2011,34(Suppl 1):S62-S69 and Standards of Medical Care in         Diabetes - 2011,Diabetes  Care,2011,34  (Suppl 1):S11-S61.* 10/28/2010    Urine Drug Screen:     Component Value Date/Time   LABOPIA NONE DETECTED 01/09/2014 2054   COCAINSCRNUR NONE DETECTED 01/09/2014 2054   LABBENZ NONE DETECTED 01/09/2014 2054   AMPHETMU NONE DETECTED 01/09/2014 2054   THCU NONE DETECTED 01/09/2014 2054   LABBARB NONE DETECTED 01/09/2014 2054    Alcohol Level:  Recent Labs Lab 01/09/14 2016  ETH <11    Dg Chest 2 View 01/10/2014    No active disease.    Mr Angiogram Head Wo Contrast 01/10/2014    MRI HEAD:   1. 5 mm acute ischemic infarct within the left periatrial white matter as above.  2. Remote infarcts involving the bilateral cerebellar hemispheres, with additional scattered remote lacunar infarcts involving the bilateral basal ganglia and supratentorial white matter.  3. Moderate atrophy and chronic microvascular ischemic disease within the supratentorial white matter and pons.    MRA HEAD:   1. 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 The right vertebral artery is dominant.  2. Short-segment stenosis of approximately 50% within the right P2 segment.  3. No other high-grade flow-limiting stenosis or proximal branch occlusion identified.  4. No intracranial aneurysm.       2D Echocardiogram  pending  Carotid Doppler  pending  EKG   Sinus rhythm rate 65 beats per minute. For complete results please see formal report.   Therapy Recommendations pending  Physical Exam   Mental Status:  Patient is awake, alert, oriented to person, place, month, year, and situation.  Immediate and remote memory are intact.  Patient is able to give a clear and coherent history.  No signs of aphasia or neglect  Cranial Nerves:  II: Left lower field cut. Pupils are equal, round, and reactive to light. Discs are difficult to visualize.  III,IV, VI: EOMI without ptosis or diploplia.  V: Facial sensation is diminished on left   VII: Facial movement is symmetric  VIII: hearing is intact to voice  X: Uvula elevates  symmetrically  XI: Shoulder shrug is symmetric.  XII: tongue is midline without atrophy or fasciculations.  Motor:  Tone is normal. Bulk is normal. 5/5 strength was present on the right side, the left she has mild (4+/5)weakness of the left leg greater than arm. It is unclear, sure weakness in the leg is due to pain.  Sensory:  Sensation is diminished throughout the left side  Deep Tendon Reflexes:  2+ and symmetric in the biceps and patellae.  Cerebellar:  She is able to perform finger-nose-finger with both arms without ataxia when her eyes are closed. With eyes opened, she has difficulty in the left side  Gait: deferred   ASSESSMENT Ms. [name] [name] is a 73 y.o. female presenting with weakness, and dizziness. TPA was not administered as the patient's symptoms were very mild and were resolving. An MRI revealed a 5 mm acute ischemic infarct within the left periatrial white matter. Infarct felt to be thrombotic secondary to small vessel disease. On aspirin 81 mg orally every day prior to admission. Now on aspirin 325 mg orally every day for secondary stroke prevention. Patient with resultant mild left hemiparesis. Stroke work up underway.   Diabetes - hemoglobin A1c 7.8  Hyperlipidemia - cholesterol 229; LDL 141 - now on Lipitor 20 mg daily  Hypertension  Multiple previous strokes   Hospital day # 1  TREATMENT/PLAN  Continue aspirin 325 mg orally every day for secondary stroke prevention.  Await 2-D echo and carotid Dopplers  Await therapy evaluations  Risk factor modifications  Consider TEE   Delton See PA-C [facility] Pager [phone] 01/10/2014, 11:05 AM  I have personally obtained a history, examined the patient, evaluated imaging results, and formulated the assessment and plan of care. I agree with the above.  B/l strokes with no hx of cardiac arhythmia.   Would strongly consider TEE if TTE does not show any signs of thrombus. Pauletta Browns

## 2014-01-10 NOTE — Progress Notes (Signed)
Hypoglycemic Event  CBG: 37  Treatment: 15 GM carbohydrate snack  Symptoms: None  Follow-up CBG: Time:1739 CBG Result:92  Possible Reasons for Event: Medication regimen: Insulin   Comments/MD notified: Dr. Mardene CelesteVann    Jesaiah Fabiano, Scarlette CalicoFrances I  Remember to initiate Hypoglycemia Order Set & complete

## 2014-01-10 NOTE — Consult Note (Signed)
Neurology Consultation Reason for Consult: Stroke Referring Physician: Ethelda Chick  CC: Generalized weakness  History is obtained from: Patient, husband  HPI: [name] [name] is a 73 y.o. female with a history of hypertension, diabetes, previous stroke who was in her normal state of health until about 7:30 PM. At that time, she felt lightheaded and felt generally weak and slid down to the floor. Her husband arrived and called 911. On arrival, she complained of dizziness, but not true vertigo. There was some concern for stroke and therefore an MRI was obtained which shows a small left parietal infarct.  She states that her symptoms are greatly improved compared to when they first started. She denies any new numbness or focal weakness.  She has left hip pain that prevents her from raising that leg for a while.  LKW: 7:30 PM tpa given?: no, mild symptoms, resolving symptoms    ROS: A 14 point ROS was performed and is negative except as noted in the HPI.  Past Medical History  Diagnosis Date  . Hypertension   . Diabetes mellitus   . Stroke   . Chronic renal insufficiency   . Hyperlipidemia   . Osteopenia   . CVA (cerebral infarction)     Family History: No history of strokes  Social History: Tob: Current smoker  Exam: Current vital signs: BP 150/81  Pulse 58  Temp(Src) 98.8 F (37.1 C)  Resp 19  SpO2 98% Vital signs in last 24 hours: Temp:  [98.8 F (37.1 C)] 98.8 F (37.1 C) (03/06 2318) Pulse Rate:  [58-75] 58 (03/07 0034) Resp:  [15-25] 19 (03/07 0034) BP: (101-168)/(45-84) 150/81 mmHg (03/07 0034) SpO2:  [94 %-100 %] 98 % (03/07 0034)  General: In bed, NAD CV: Regular rate and rhythm Mental Status: Patient is awake, alert, oriented to person, place, month, year, and situation. Immediate and remote memory are intact. Patient is able to give a clear and coherent history. No signs of aphasia or neglect Cranial Nerves: II: Left lower field cut. Pupils are  equal, round, and reactive to light.  Discs are difficult to visualize. III,IV, VI: EOMI without ptosis or diploplia.  V: Facial sensation is diminished on left VII: Facial movement is symmetric VIII: hearing is intact to voice X: Uvula elevates symmetrically XI: Shoulder shrug is symmetric. XII: tongue is midline without atrophy or fasciculations.  Motor: Tone is normal. Bulk is normal. 5/5 strength was present on the right side, the left she has mild (4+/5)weakness of the left leg greater than arm. It is unclear, sure weakness in the leg is due to pain. Sensory: Sensation is diminished throughout the left side Deep Tendon Reflexes: 2+ and symmetric in the biceps and patellae.  Cerebellar: She is able to perform finger-nose-finger with both arms without ataxia when her eyes are closed. With eyes opened, she has difficulty in the left side Gait: Not performed secondary to patient safety concerns         I have reviewed labs in epic and the results pertinent to this consultation are: Mildly elevated creatinine  I have reviewed the images obtained: MRI brain she has a punctate area of infarct and left parietal region. On the coronal images, there does appear to be a larger area of haziness diffusion change which could represent that had previously been threatened  Impression: 73 year old female with small left parietal infarct. I suspect that she had a larger area with threatened ischemia with most of it improving and the small dot we see  representing sequela. She'll need to be admitted for a stroke workup. I also have advised her to stop smoking  Recommendations: 1. HgbA1c, fasting lipid panel 2. MRI, MRA  of the brain without contrast 3. Frequent neuro checks 4. Echocardiogram 5. Carotid dopplers 6. Prophylactic therapy-Antiplatelet med: Aspirin - dose 325mg  7. Risk factor modification 8. Telemetry monitoring 9. PT consult, OT consult, Speech consult    Ritta SlotMcNeill  Kirkpatrick, MD [facility] [phone]  If 7pm- 7am, please page neurology on call at [phone].

## 2014-01-10 NOTE — Progress Notes (Signed)
Nutrition Brief Note  Patient identified on the Malnutrition Screening Tool (MST) Report  Wt Readings from Last 15 Encounters:  01/10/14 193 lb 15.4 oz (87.98 kg)  05/13/13 194 lb (87.998 kg)    Body mass index is 35.47 kg/(m^2). Patient meets criteria for Obesity II based on current BMI.   Current diet order is NPO pending SLP evaluation. Labs and medications reviewed.   Pt reported a 60 lbs intentional weight loss that occurred over a 10 month time period via diet modification and complying to a largely vegetarian diet. Pt now eats animal proteins occasionally. Consumes 2 meals/day and snacks. Weight has stabilized. Denied any dysphagia/difficulty chewing/swallowing pta. No signs of muscle wasting or subcutaneous fat loss  No nutrition interventions warranted at this time. Reviewed heart healthy diet basics. If nutrition issues arise, please consult RD.   Lloyd HugerSarah F Aldon Hengst MS RD LDN Clinical Dietitian Pager:[phone]

## 2014-01-11 LAB — MAGNESIUM: MAGNESIUM: 1.8 mg/dL (ref 1.5–2.5)

## 2014-01-11 LAB — BASIC METABOLIC PANEL
BUN: 25 mg/dL — ABNORMAL HIGH (ref 6–23)
CHLORIDE: 91 meq/L — AB (ref 96–112)
CO2: 28 mEq/L (ref 19–32)
CREATININE: 1.32 mg/dL — AB (ref 0.50–1.10)
Calcium: 9.2 mg/dL (ref 8.4–10.5)
GFR, EST AFRICAN AMERICAN: 45 mL/min — AB (ref >90)
GFR, EST NON AFRICAN AMERICAN: 39 mL/min — AB (ref >90)
Glucose, Bld: 478 mg/dL — ABNORMAL HIGH (ref 70–99)
Potassium: 4.4 mEq/L (ref 3.7–5.3)
Sodium: 133 mEq/L — ABNORMAL LOW (ref 137–147)

## 2014-01-11 LAB — CBC
HCT: 35.7 % — ABNORMAL LOW (ref 36.0–46.0)
Hemoglobin: 12.6 g/dL (ref 12.0–15.0)
MCH: 30.4 pg (ref 26.0–34.0)
MCHC: 35.3 g/dL (ref 30.0–36.0)
MCV: 86.2 fL (ref 78.0–100.0)
PLATELETS: 213 10*3/uL (ref 150–400)
RBC: 4.14 MIL/uL (ref 3.87–5.11)
RDW: 12.4 % (ref 11.5–15.5)
WBC: 6.6 10*3/uL (ref 4.0–10.5)

## 2014-01-11 LAB — GLUCOSE, CAPILLARY
GLUCOSE-CAPILLARY: 175 mg/dL — AB (ref 70–99)
GLUCOSE-CAPILLARY: 255 mg/dL — AB (ref 70–99)
Glucose-Capillary: 458 mg/dL — ABNORMAL HIGH (ref 70–99)
Glucose-Capillary: 368 mg/dL — ABNORMAL HIGH (ref 70–99)
Glucose-Capillary: 162 mg/dL — ABNORMAL HIGH (ref 70–99)

## 2014-01-11 MED ORDER — INSULIN GLARGINE 100 UNIT/ML ~~LOC~~ SOLN
15.0000 [IU] | Freq: Two times a day (BID) | SUBCUTANEOUS | Status: DC
  Administered 2014-01-11 – 2014-01-12 (×4): 15 [IU] via SUBCUTANEOUS
  Administered 2014-01-13: 10 [IU] via SUBCUTANEOUS
  Filled 2014-01-11 (×6): qty 0.15

## 2014-01-11 MED ORDER — INSULIN ASPART 100 UNIT/ML ~~LOC~~ SOLN
18.0000 [IU] | Freq: Once | SUBCUTANEOUS | Status: AC
  Administered 2014-01-11: 18 [IU] via SUBCUTANEOUS

## 2014-01-11 NOTE — Progress Notes (Signed)
Stroke Team Progress Note  HISTORY [name] [name] is a 73 y.o. female with a history of hypertension, diabetes, and previous stroke who was in her normal state of health until about 7:30 PM on 01/09/2014.  At that time, she felt lightheaded and felt generally weak and slid down to the floor. Her husband arrived and called 911. On arrival, she complained of dizziness, but not true vertigo. There was some concern for stroke and therefore an MRI was obtained which showed a small left parietal infarct. She stated that her symptoms were greatly improved compared to when they first started. She denied any new numbness or focal weakness. She had left hip pain that prevented her from raising that leg.  LKW: 7:30 PM  01/09/2014 tpa given?: no, mild symptoms, resolving symptoms   SUBJECTIVE   OBJECTIVE Most recent Vital Signs: Filed Vitals:   01/10/14 2121 01/11/14 0103 01/11/14 0527 01/11/14 0820  BP: 145/58 178/72 176/76 153/63  Pulse: 70 81 81 87  Temp: 97.9 F (36.6 C) 99.4 F (37.4 C) 99.6 F (37.6 C) 100.2 F (37.9 C)  TempSrc: Oral Oral Oral Oral  Resp: 20 20 20 20   Height:      Weight:      SpO2: 100% 99% 99% 99%   CBG (last 3)   Recent Labs  01/10/14 2107 01/11/14 0628 01/11/14 0815  GLUCAP 245* 458* 368*    IV Fluid Intake:     MEDICATIONS  . atorvastatin  20 mg Oral q1800  . cyclobenzaprine  10 mg Oral Daily  . enoxaparin (LOVENOX) injection  30 mg Subcutaneous Daily  . furosemide  20 mg Oral BID  . gabapentin  300 mg Oral Daily  . glimepiride  4 mg Oral QAC breakfast  . hydrALAZINE  10 mg Oral QID  . insulin aspart  0-15 Units Subcutaneous TID WC  . insulin aspart  0-5 Units Subcutaneous QHS  . insulin glargine  15 Units Subcutaneous BID  . latanoprost  1 drop Both Eyes QHS  . levothyroxine  50 mcg Oral QAC breakfast  . lisinopril  20 mg Oral Daily  . metoprolol tartrate  25 mg Oral BID  . olopatadine  1 drop Both Eyes BID  . pantoprazole  40 mg Oral Daily  .  potassium chloride SA  20 mEq Oral BID   PRN:  senna-docusate  Diet:  Carb Control thin liquids Activity:  Bedrest DVT Prophylaxis:  Lovenox  CLINICALLY SIGNIFICANT STUDIES Basic Metabolic Panel:   Recent Labs Lab 01/09/14 2016 01/09/14 2042 01/10/14 1100 01/11/14 0520  NA 142 140  --  133*  K 3.2* 3.1*  --  4.4  CL 97 97  --  91*  CO2 31  --   --  28  GLUCOSE 80 77  --  478*  BUN 24* 24*  --  25*  CREATININE 1.45* 1.60*  --  1.32*  CALCIUM 9.5  --   --  9.2  MG  --   --  1.4*  --    Liver Function Tests:   Recent Labs Lab 01/09/14 2016  AST 13  ALT 6  ALKPHOS 88  BILITOT 0.2*  PROT 7.9  ALBUMIN 4.0   CBC:   Recent Labs Lab 01/09/14 2016 01/09/14 2042 01/11/14 0520  WBC 6.4  --  6.6  NEUTROABS 3.8  --   --   HGB 13.6 14.6 12.6  HCT 38.0 43.0 35.7*  MCV 85.8  --  86.2  PLT 255  --  213   Coagulation:   Recent Labs Lab 01/09/14 2016  LABPROT 13.1  INR 1.01   Cardiac Enzymes: No results found for this basename: CKTOTAL, CKMB, CKMBINDEX, TROPONINI,  in the last 168 hours Urinalysis:   Recent Labs Lab 01/09/14 2055  COLORURINE YELLOW  LABSPEC 1.017  PHURINE 6.5  GLUCOSEU >1000*  HGBUR NEGATIVE  BILIRUBINUR NEGATIVE  KETONESUR NEGATIVE  PROTEINUR 30*  UROBILINOGEN 0.2  NITRITE NEGATIVE  LEUKOCYTESUR NEGATIVE   Lipid Panel    Component Value Date/Time   CHOL 229* 01/10/2014 0520   TRIG 215* 01/10/2014 0520   HDL 45 01/10/2014 0520   CHOLHDL 5.1 01/10/2014 0520   VLDL 43* 01/10/2014 0520   LDLCALC 141* 01/10/2014 0520   HgbA1C  Lab Results  Component Value Date   HGBA1C 10.4* 01/10/2014    Urine Drug Screen:     Component Value Date/Time   LABOPIA NONE DETECTED 01/09/2014 2054   COCAINSCRNUR NONE DETECTED 01/09/2014 2054   LABBENZ NONE DETECTED 01/09/2014 2054   AMPHETMU NONE DETECTED 01/09/2014 2054   THCU NONE DETECTED 01/09/2014 2054   LABBARB NONE DETECTED 01/09/2014 2054    Alcohol Level:   Recent Labs Lab 01/09/14 2016  ETH <11    Dg  Chest 2 View 01/10/2014    No active disease.    Mr Angiogram Head Wo Contrast 01/10/2014    MRI HEAD:   1. 5 mm acute ischemic infarct within the left periatrial white matter as above.  2. Remote infarcts involving the bilateral cerebellar hemispheres, with additional scattered remote lacunar infarcts involving the bilateral basal ganglia and supratentorial white matter.  3. Moderate atrophy and chronic microvascular ischemic disease within the supratentorial white matter and pons.    MRA HEAD:   1. 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 The right vertebral artery is dominant.  2. Short-segment stenosis of approximately 50% within the right P2 segment.  3. No other high-grade flow-limiting stenosis or proximal branch occlusion identified.  4. No intracranial aneurysm.       2D Echocardiogram  ejection fraction 60-65%. No cardiac source of emboli identified.  Carotid Doppler  pending  EKG   Sinus rhythm rate 65 beats per minute. For complete results please see formal report.   Therapy Recommendations pending  Physical Exam   Mental Status:  Patient is awake, alert, oriented to person, place, month, year, and situation.  Immediate and remote memory are intact.  Patient is able to give a clear and coherent history.  No signs of aphasia or neglect  Cranial Nerves:  II: Left lower field cut. Pupils are equal, round, and reactive to light. Discs are difficult to visualize.  III,IV, VI: EOMI without ptosis or diploplia.  V: Facial sensation is diminished on left  VII: Facial movement is symmetric  VIII: hearing is intact to voice  X: Uvula elevates symmetrically  XI: Shoulder shrug is symmetric.  XII: tongue is midline without atrophy or fasciculations.  Motor:  Tone is normal. Bulk is normal. 5/5 strength was present on the right side, the left she has mild (4+/5)weakness of the left leg greater than  arm. It is unclear, sure weakness in the leg is due to pain.  Sensory:  Sensation is diminished throughout the left side  Deep Tendon Reflexes:  2+ and symmetric in the biceps and patellae.  Cerebellar:  She is able to perform finger-nose-finger with both arms without ataxia when her  eyes are closed. With eyes opened, she has difficulty in the left side  Gait: deferred   ASSESSMENT Ms. [name] [name] is a 73 y.o. female presenting with weakness, and dizziness. TPA was not administered as the patient's symptoms were very mild and were resolving. An MRI revealed a 5 mm acute ischemic infarct within the left periatrial white matter. Infarct felt to be thrombotic secondary to small vessel disease. On aspirin 81 mg orally every day prior to admission. Now on aspirin 325 mg orally every day for secondary stroke prevention. Patient with resultant mild left hemiparesis. Stroke work up underway.   Diabetes - hemoglobin A1c 10.4  Hyperlipidemia - cholesterol 229; LDL 141 - now on Lipitor 20 mg daily  Hypertension  Multiple previous strokes  Legally blind   Hospital day # 2  TREATMENT/PLAN  Continue aspirin 325 mg orally every day for secondary stroke prevention.  Await carotid Dopplers  Await therapy evaluations  Risk factor modifications - aggressive management of diabetes, hyperlipidemia, and hypertension  Consider TEE per Dr Loretha BrasilZeylikman - make patient n.p.o. after midnight. Decision can be made tomorrow regarding need for TEE.   Delton Seeavid Rinehuls PA-C [facility] Pager [phone] 01/11/2014, 9:08 AM  I have personally obtained a history, examined the patient, evaluated imaging results, and formulated the assessment and plan of care. I agree with the above.  B/l strokes with no hx of cardiac arhythmia.  Would strongly consider TEE, NPO after midnight.

## 2014-01-11 NOTE — Evaluation (Signed)
Physical Therapy Evaluation Patient Details Name: [name] [name] MRN: [id number] DOB: 08/26/1941 Today's Date: 01/11/2014 Time: 1202-1224 PT Time Calculation (min): 22 min  PT Assessment / Plan / Recommendation History of Present Illness  [name] [name] is a 73 y.o. female with a history of a CVA in 2002, HTN, DM2, and PVD who presents to the Ed with complaints of sudden onset of feeling dizzy and sluggish and then almost falling instead she slid out of her chair to the floor.   She had increased weakness of her left side and her symptoms started at around 7:30pm.   She denied having any Headache or Chest pain.   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  Clinical Impression  Patient presents with problems listed below.  Will benefit from acute PT to address these issues.  Patient very lethargic today (per nursing, patient did not sleep last night), impacting mobility.  Recommend Inpatient Rehab consult for comprehensive therapies to achieve highest level of function prior to returning home with husband.    PT Assessment  Patient needs continued PT services    Follow Up Recommendations  CIR;Supervision/Assistance - 24 hour    Does the patient have the potential to tolerate intense rehabilitation      Barriers to Discharge Decreased caregiver support Will need to function at min assist level for husband to care for patient.    Equipment Recommendations  3in1 (PT)    Recommendations for Other Services Rehab consult   Frequency Min 4X/week    Precautions / Restrictions Precautions Precautions: Fall Restrictions Weight Bearing Restrictions: No   Pertinent Vitals/Pain       Mobility  Bed Mobility Overal bed mobility: Needs Assistance;+2 for physical assistance Bed Mobility: Supine to Sit Supine to sit: Max assist;+2  for physical assistance General bed mobility comments: Verbal and tactile cues for technique.  Mod assist for sitting balance once upright. Transfers Overall transfer level: Needs assistance Equipment used: 2 person hand held assist Transfers: Sit to/from [name] Sit to Stand: Max assist;+2 physical assistance Stand pivot transfers: Max assist;+2 safety/equipment General transfer comment: Verbal cues for hand placement.  Required +2 assist to rise to standing and for balance.  Patient having difficulty bearing weight on LLE.  Required +2 assist to pivot to chair, bearing minimal weight on LLE. Modified Rankin (Stroke Patients Only) Pre-Morbid Rankin Score: Moderate disability Modified Rankin: Severe disability    Exercises     PT Diagnosis: Difficulty walking;Hemiplegia non-dominant side  PT Problem List: Decreased strength;Decreased activity tolerance;Decreased balance;Decreased mobility;Decreased safety awareness;Decreased knowledge of use of DME;Decreased coordination PT Treatment Interventions: DME instruction;Gait training;Functional mobility training;Balance training;Neuromuscular re-education;Patient/family education;Wheelchair mobility training     PT Goals(Current goals can be found in the care plan section) Acute Rehab PT Goals Patient Stated Goal: Unable to state due to lethargy PT Goal Formulation: With patient/family Time For Goal Achievement: 01/25/14 Potential to Achieve Goals: Good  Visit Information  Last PT Received On: 01/11/14 Assistance Needed: +2 History of Present Illness: [name] [name] is a 73 y.o. female with a history of a CVA in 2002, HTN, DM2, and PVD who presents to the Ed with complaints of sudden onset of feeling dizzy and sluggish and then almost falling instead she slid out of her chair to the floor.   She had increased weakness  of her left side and her symptoms started at around 7:30pm.   She denied having any Headache or Chest  pain.   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       Prior Functioning  Home Living Family/patient expects to be discharged to:: Inpatient rehab Living Arrangements: Spouse/significant other Available Help at Discharge: Family;Available 24 hours/day Type of Home: House Home Access: Ramped entrance Home Layout: One level Home Equipment: Walker - 2 wheels;Shower seat;Electric scooter Prior Function Level of Independence: Needs assistance Gait / Transfers Assistance Needed: Assist to transfer and for ambulation with RW ADL's / Homemaking Assistance Needed: Assist for bathing/dressing and meal prep. Comments: Patient legally blind Communication Communication: No difficulties    Cognition  Cognition Arousal/Alertness: Lethargic (Per RN, did not sleep last night) Behavior During Therapy: Flat affect (Falling asleep during conversation) Overall Cognitive Status: Difficult to assess Difficult to assess due to: Level of arousal    Extremity/Trunk Assessment Upper Extremity Assessment Upper Extremity Assessment: LUE deficits/detail LUE Deficits / Details: Decreased strength - grossly 3/5.  As PT entered room, patient supine with LUE hanging off of bed. Lower Extremity Assessment Lower Extremity Assessment: Generalized weakness;LLE deficits/detail LLE Deficits / Details: Strength grossly 2/5.  Required assist to move LLE off of bed.   Balance Balance Overall balance assessment: Needs assistance Sitting-balance support: Bilateral upper extremity supported;Feet supported Sitting balance-Leahy Scale: Poor Sitting balance - Comments: Patient sat EOB x 6 minutes.  Patient leaning to left and posteriorly. Postural control: Posterior lean;Left lateral lean Standing balance support: Bilateral upper extremity supported Standing  balance-Leahy Scale: Zero  End of Session PT - End of Session Equipment Utilized During Treatment: Gait belt Activity Tolerance: Patient limited by lethargy;Patient limited by fatigue Patient left: in chair;with call bell/phone within reach;with family/visitor present Nurse Communication: Mobility status (+2 for transfers; lethargic; in chair; needs assist to eat)  GP     Vena AustriaDavis, Yaviel Kloster H 01/11/2014, 1:39 PM Durenda HurtSusan H. Renaldo Fiddleravis, PT, [facility] Acute Rehab Services Pager [phone]

## 2014-01-11 NOTE — Progress Notes (Signed)
VASCULAR LAB PRELIMINARY  PRELIMINARY  PRELIMINARY  PRELIMINARY  Carotid Dopplers completed.    Preliminary report:  1-39% ICA stenosis.  Vertebral artery flow is antegrade.  Rondal Vandevelde, RVT 01/11/2014, 10:30 AM

## 2014-01-11 NOTE — Progress Notes (Signed)
Notified on call NP of patients Mag level of 1.4. New order for 1gram mag IV. Mag to be re-checked this am. Patient glucose of 458. New order for 18 units of regular insulin to be given subq once.

## 2014-01-11 NOTE — Progress Notes (Signed)
PROGRESS NOTE  [name] [name]:[id number] DOB: 05-15-1941 DOA: 01/09/2014 PCP: Alva Garnet., MD  Assessment/Plan: CVA-  MRI/MRA studies done 03/06 + for CVA Seen by Neuro  Placed on ASA , Not a TPA candidate.  PT.OT and Speech consults   Fasting Lipids:LDL 141- add statin HbA1c: >10 ?TEE will defer to neuro  Fall- Due to #1, And weakness, also has an unsteady gait, PT consult.   Dizziness- Acute on chronic, has hx of previous Cerebellar CVA.    HTN- Continue Metoprolol, LIsinopril, ,Lasix, and Hydralazine Rx. Monitor BPs.   Hyperlipidemia - add statin.   DM2- Check HbA1c, add SSI coverage PRN, change to lantus -spoke at length with patient regarding her DM- does not seem to understand (had large frosty last PM- does not consider that sugar)- will get education -on 70/30 had episode of hypotension; change to lantus and titrate up  PVD- On Gabapentin rx.   Hypokalemia- due to Lasix Rx, Replete K+,. Check Mg- bmp in AM  CKD- baseline 1.4  Code Status: full Family Communication: patient Disposition Plan:    Consultants:  Neuro  Diabetic educator  Procedures:    Antibiotics:    HPI/Subjective: No overnight events Not compliant with diet  Objective: Filed Vitals:   01/11/14 0820  BP: 153/63  Pulse: 87  Temp: 100.2 F (37.9 C)  Resp: 20    Intake/Output Summary (Last 24 hours) at 01/11/14 1050 Last data filed at 01/10/14 1740  Gross per 24 hour  Intake    240 ml  Output      0 ml  Net    240 ml   Filed Weights   01/10/14 0300  Weight: 87.98 kg (193 lb 15.4 oz)    Exam:   General:  cooperative  Cardiovascular: rrr  Respiratory: clear anterior  Abdomen: +Bs, soft  Musculoskeletal: moves all 4 ext   Data Reviewed: Basic Metabolic Panel:  Recent Labs Lab 01/09/14 2016 01/09/14 2042 01/10/14 1100 01/11/14 0520 01/11/14 0740  NA 142 140  --  133*  --   K 3.2* 3.1*  --  4.4  --   CL 97 97  --  91*  --   CO2 31  --   --   28  --   GLUCOSE 80 77  --  478*  --   BUN 24* 24*  --  25*  --   CREATININE 1.45* 1.60*  --  1.32*  --   CALCIUM 9.5  --   --  9.2  --   MG  --   --  1.4*  --  1.8   Liver Function Tests:  Recent Labs Lab 01/09/14 2016  AST 13  ALT 6  ALKPHOS 88  BILITOT 0.2*  PROT 7.9  ALBUMIN 4.0   No results found for this basename: LIPASE, AMYLASE,  in the last 168 hours No results found for this basename: AMMONIA,  in the last 168 hours CBC:  Recent Labs Lab 01/09/14 2016 01/09/14 2042 01/11/14 0520  WBC 6.4  --  6.6  NEUTROABS 3.8  --   --   HGB 13.6 14.6 12.6  HCT 38.0 43.0 35.7*  MCV 85.8  --  86.2  PLT 255  --  213   Cardiac Enzymes: No results found for this basename: CKTOTAL, CKMB, CKMBINDEX, TROPONINI,  in the last 168 hours BNP (last 3 results) No results found for this basename: PROBNP,  in the last 8760 hours CBG:  Recent Labs Lab 01/10/14 1713  01/10/14 1739 01/10/14 2107 01/11/14 0628 01/11/14 0815  GLUCAP 61* 92 245* 458* 368*    No results found for this or any previous visit (from the past 240 hour(s)).   Studies: Dg Chest 2 View  01/10/2014   CLINICAL DATA:  Cerebral infarction and weakness.  EXAM: CHEST - 2 VIEW  COMPARISON:  DG CHEST 2 VIEW dated 10/31/2013; MR HEAD W/O CM dated 01/09/2014; DG CHEST 2 VIEW dated 06/29/2013  FINDINGS: The heart size and mediastinal contours are within normal limits. There is no evidence of pulmonary edema, consolidation, pneumothorax, nodule or pleural fluid. The visualized skeletal structures show mild degenerative changes of the thoracic spine.  IMPRESSION: No active disease.   Electronically Signed   By: Irish Lack M.D.   On: 01/10/2014 09:03   Mr Angiogram Head Wo Contrast  01/10/2014   CLINICAL DATA:  Dizziness. Evaluate for posterior circulation stroke.  EXAM: MRI HEAD WITHOUT CONTRAST  MRA HEAD WITHOUT CONTRAST  TECHNIQUE: Multiplanar, multiecho pulse sequences of the brain and surrounding structures were obtained  without intravenous contrast. Angiographic images of the head were obtained using MRA technique without contrast.  COMPARISON:  None available.  FINDINGS: MRI HEAD FINDINGS  Diffuse prominence of the CSF containing spaces is compatible with mild age-related atrophy. Scattered and confluent T2/FLAIR hyperintensity within the periventricular and deep white matter both cerebral hemispheres is compatible with chronic small vessel ischemic changes. Similar changes are seen within the pons. Scattered remote cerebellar infarcts are noted bilaterally, most evident within the and left cerebellar hemisphere (series 10, image 17). Additional scattered remote lacunar infarcts seen within the periventricular and deep white matter both cerebral hemispheres bilaterally, most evident adjacent to the anterior horn of the left lateral ventricle (series 8, image 13). Remote lacunar infarcts within the bilateral basal ganglia also noted.  Single tiny focus of restricted diffusion measuring 5 mm is seen within the periatrial white matter of the left frontoparietal region (series 3, image 19). No other abnormal foci of restricted diffusion are seen. No evidence of acute intracranial hemorrhage. Multiple scattered subcentimeter hypo intense foci seen on gradient echo sequence within the supratentorial and infratentorial brain likely represent small chronic micro hemorrhages, which may be related to hypertension.  No mass lesion, midline shift, or extra-axial fluid collection. Ventricles are normal in size without evidence of hydrocephalus.  No diffusion-weighted signal abnormality is identified to suggest acute intracranial infarct. Gray-white matter differentiation is maintained. Normal flow voids are seen within the intracranial vasculature. No intracranial hemorrhage identified.  The cervicomedullary junction is normal. Pituitary gland is within normal limits. Pituitary stalk is midline. The globes and optic nerves demonstrate a normal  appearance with normal signal intensity. The  The bone marrow signal intensity is normal. Calvarium is intact. Visualized upper cervical spine is within normal limits.  Scalp soft tissues are unremarkable.  Paranasal sinuses are clear.  No mastoid effusion.  MRA HEAD FINDINGS  Anterior circulation: Normal flow related enhancement of the included cervical internal carotid arteries. The petrous, cavernous, and supra clinoid segments are widely patent bilaterally with antegrade flow. Patent anterior communicating artery. Normal flow related enhancement of the anterior and middle cerebral arteries, including more distal segments. Multi focal irregularity seen within the M1 segments bilaterally, likely related to underlying atheromatous disease.  Posterior circulation: Right Vertebral artery is dominant. Short-segment stenosis of at least 70% is seen within the distal left vertebral artery just prior to the vertebrobasilar junction. Basilar artery is patent, with normal flow related enhancement of  the main branch vessels. Normal flow related enhancement of the posterior cerebral arteries. Short segment stenosis of approximately 50% seen within the right P2 segment.  No large vessel occlusion, hemodynamically significant stenosis, abnormal luminal irregularity, aneurysm within the anterior nor posterior circulation.  IMPRESSION: MRI HEAD:  1. 5 mm acute ischemic infarct within the left periatrial white matter as above. 2. Remote infarcts involving the bilateral cerebellar hemispheres, with additional scattered remote lacunar infarcts involving the bilateral basal ganglia and supratentorial white matter. 3. Moderate atrophy and chronic microvascular ischemic disease within the supratentorial white matter and pons.  MRA HEAD:  1. 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 The right vertebral artery is dominant. 2. Short-segment stenosis of  approximately 50% within the right P2 segment. 3. No other high-grade flow-limiting stenosis or proximal branch occlusion identified. 4. No intracranial aneurysm.   Electronically Signed   By: Rise MuBenjamin  McClintock M.D.   On: 01/10/2014 00:01   Mr Brain Wo Contrast  01/10/2014   CLINICAL DATA:  Dizziness. Evaluate for posterior circulation stroke.  EXAM: MRI HEAD WITHOUT CONTRAST  MRA HEAD WITHOUT CONTRAST  TECHNIQUE: Multiplanar, multiecho pulse sequences of the brain and surrounding structures were obtained without intravenous contrast. Angiographic images of the head were obtained using MRA technique without contrast.  COMPARISON:  None available.  FINDINGS: MRI HEAD FINDINGS  Diffuse prominence of the CSF containing spaces is compatible with mild age-related atrophy. Scattered and confluent T2/FLAIR hyperintensity within the periventricular and deep white matter both cerebral hemispheres is compatible with chronic small vessel ischemic changes. Similar changes are seen within the pons. Scattered remote cerebellar infarcts are noted bilaterally, most evident within the and left cerebellar hemisphere (series 10, image 17). Additional scattered remote lacunar infarcts seen within the periventricular and deep white matter both cerebral hemispheres bilaterally, most evident adjacent to the anterior horn of the left lateral ventricle (series 8, image 13). Remote lacunar infarcts within the bilateral basal ganglia also noted.  Single tiny focus of restricted diffusion measuring 5 mm is seen within the periatrial white matter of the left frontoparietal region (series 3, image 19). No other abnormal foci of restricted diffusion are seen. No evidence of acute intracranial hemorrhage. Multiple scattered subcentimeter hypo intense foci seen on gradient echo sequence within the supratentorial and infratentorial brain likely represent small chronic micro hemorrhages, which may be related to hypertension.  No mass lesion,  midline shift, or extra-axial fluid collection. Ventricles are normal in size without evidence of hydrocephalus.  No diffusion-weighted signal abnormality is identified to suggest acute intracranial infarct. Gray-white matter differentiation is maintained. Normal flow voids are seen within the intracranial vasculature. No intracranial hemorrhage identified.  The cervicomedullary junction is normal. Pituitary gland is within normal limits. Pituitary stalk is midline. The globes and optic nerves demonstrate a normal appearance with normal signal intensity. The  The bone marrow signal intensity is normal. Calvarium is intact. Visualized upper cervical spine is within normal limits.  Scalp soft tissues are unremarkable.  Paranasal sinuses are clear.  No mastoid effusion.  MRA HEAD FINDINGS  Anterior circulation: Normal flow related enhancement of the included cervical internal carotid arteries. The petrous, cavernous, and supra clinoid segments are widely patent bilaterally with antegrade flow. Patent anterior communicating artery. Normal flow related enhancement of the anterior and middle cerebral arteries, including more distal segments. Multi focal irregularity seen within the M1 segments bilaterally, likely related to underlying atheromatous disease.  Posterior  circulation: Right Vertebral artery is dominant. Short-segment stenosis of at least 70% is seen within the distal left vertebral artery just prior to the vertebrobasilar junction. Basilar artery is patent, with normal flow related enhancement of the main branch vessels. Normal flow related enhancement of the posterior cerebral arteries. Short segment stenosis of approximately 50% seen within the right P2 segment.  No large vessel occlusion, hemodynamically significant stenosis, abnormal luminal irregularity, aneurysm within the anterior nor posterior circulation.  IMPRESSION: MRI HEAD:  1. 5 mm acute ischemic infarct within the left periatrial white matter as  above. 2. Remote infarcts involving the bilateral cerebellar hemispheres, with additional scattered remote lacunar infarcts involving the bilateral basal ganglia and supratentorial white matter. 3. Moderate atrophy and chronic microvascular ischemic disease within the supratentorial white matter and pons.  MRA HEAD:  1. 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 The right vertebral artery is dominant. 2. Short-segment stenosis of approximately 50% within the right P2 segment. 3. No other high-grade flow-limiting stenosis or proximal branch occlusion identified. 4. No intracranial aneurysm.   Electronically Signed   By: Rise Mu M.D.   On: 01/10/2014 00:01    Scheduled Meds: . atorvastatin  20 mg Oral q1800  . cyclobenzaprine  10 mg Oral Daily  . enoxaparin (LOVENOX) injection  30 mg Subcutaneous Daily  . furosemide  20 mg Oral BID  . gabapentin  300 mg Oral Daily  . glimepiride  4 mg Oral QAC breakfast  . hydrALAZINE  10 mg Oral QID  . insulin aspart  0-15 Units Subcutaneous TID WC  . insulin aspart  0-5 Units Subcutaneous QHS  . insulin glargine  15 Units Subcutaneous BID  . latanoprost  1 drop Both Eyes QHS  . levothyroxine  50 mcg Oral QAC breakfast  . lisinopril  20 mg Oral Daily  . metoprolol tartrate  25 mg Oral BID  . olopatadine  1 drop Both Eyes BID  . pantoprazole  40 mg Oral Daily  . potassium chloride SA  20 mEq Oral BID   Continuous Infusions:  Antibiotics Given (last 72 hours)   None      Principal Problem:   CVA (cerebral infarction) Active Problems:   Peripheral vascular disease, unspecified   Dizziness   Fall   Hypertension   Hyperlipidemia   Type II or unspecified type diabetes mellitus without mention of complication, not stated as uncontrolled   Hypokalemia    Time spent: 35 min   Gavriel Holzhauer  [facility] Pager [phone]. If 7PM-7AM, please contact  night-coverage at [email or web address], password [facility] 01/11/2014, 10:50 AM  LOS: 2 days

## 2014-01-11 NOTE — Progress Notes (Signed)
Rehab Admissions Coordinator Note:  Patient was screened by Clois DupesBoyette, Drew Herman Godwin for appropriateness for an Inpatient Acute Rehab Consult.  At this time, we are recommending Inpatient Rehab consult  Per PT recommendations. Please order if you agree.  Clois DupesBoyette, Eason Housman Godwin 01/11/2014, 6:07 PM  I can be reached at [phone].

## 2014-01-12 LAB — GLUCOSE, CAPILLARY
GLUCOSE-CAPILLARY: 122 mg/dL — AB (ref 70–99)
GLUCOSE-CAPILLARY: 102 mg/dL — AB (ref 70–99)
GLUCOSE-CAPILLARY: 184 mg/dL — AB (ref 70–99)
Glucose-Capillary: 128 mg/dL — ABNORMAL HIGH (ref 70–99)

## 2014-01-12 MED ORDER — THERA-DERM EX LOTN
TOPICAL_LOTION | CUTANEOUS | Status: DC | PRN
  Filled 2014-01-12 (×2): qty 236

## 2014-01-12 MED ORDER — ALBUTEROL SULFATE (2.5 MG/3ML) 0.083% IN NEBU
2.5000 mg | INHALATION_SOLUTION | RESPIRATORY_TRACT | Status: DC | PRN
  Administered 2014-01-12: 2.5 mg via RESPIRATORY_TRACT
  Filled 2014-01-12: qty 3

## 2014-01-12 MED ORDER — CETAPHIL MOISTURIZING EX LOTN
TOPICAL_LOTION | CUTANEOUS | Status: DC | PRN
  Administered 2014-01-12: 23:00:00 via TOPICAL
  Filled 2014-01-12: qty 473

## 2014-01-12 MED ORDER — GUAIFENESIN-DM 100-10 MG/5ML PO SYRP
5.0000 mL | ORAL_SOLUTION | ORAL | Status: DC | PRN
  Administered 2014-01-12: 5 mL via ORAL
  Filled 2014-01-12: qty 5

## 2014-01-12 NOTE — Evaluation (Addendum)
Occupational Therapy Evaluation Patient Details Name: [name] [name] MRN: [id number] DOB: November 11, 1940 Today's Date: 01/12/2014 Time: [id number] OT Time Calculation (min): 27 min  OT Assessment / Plan / Recommendation History of present illness [name] [name] is a 73 y.o. female with a history of a CVA in 2002, HTN, DM2, and PVD who presents to the Ed with complaints of sudden onset of feeling dizzy and sluggish and then almost falling instead she slid out of her chair to the floor.   She had increased weakness of her left side and her symptoms started at around 7:30pm.   She denied having any Headache or Chest pain.   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   Clinical Impression   Pt presents with below problem list. Feel pt will benefit from acute OT to increase independence prior to d/c. Recommending CIR for additional rehab prior to d/c home.     OT Assessment  Patient needs continued OT Services    Follow Up Recommendations  CIR;Supervision/Assistance - 24 hour    Barriers to Discharge      Equipment Recommendations  Other (comment) (defer to next venue)    Recommendations for Other Services Rehab consult  Frequency  Min 2X/week    Precautions / Restrictions Precautions Precautions: Fall Restrictions Weight Bearing Restrictions: No   Pertinent Vitals/Pain Pt reports tingling in LUE. Pt did not report pain when asked at end of session.    ADL  Eating/Feeding: Minimal assistance Where Assessed - Eating/Feeding: Chair Grooming: Brushing hair;Set up;Supervision/safety Where Assessed - Grooming: Supported sitting Upper Body Bathing: Minimal assistance Where Assessed - Upper Body Bathing: Supported sitting Lower Body Bathing: Moderate assistance Where Assessed - Lower Body Bathing: Supported sit to stand Upper Body  Dressing: Minimal assistance Where Assessed - Upper Body Dressing: Supported sitting Lower Body Dressing: Maximal assistance Where Assessed - Lower Body Dressing: Supported sit to Pharmacist, hospitalstand Toilet Transfer: Minimal assistance Toilet Transfer Method: Sit to stand;Stand pivot AcupuncturistToilet Transfer Equipment: Bedside commode Toileting - Clothing Manipulation and Hygiene: Minimal assistance Where Assessed - Engineer, miningToileting Clothing Manipulation and Hygiene: Sit to stand from 3-in-1 or toilet;Sit on 3-in-1 or toilet;Lean right and/or left Tub/Shower Transfer Method: Not assessed Equipment Used: Gait belt;Sock aid;Rolling walker Transfers/Ambulation Related to ADLs: Min A ADL Comments: Educated spouse and pt to be aware where LUE is at and to avoid hot surfaces and sharp objects due to decreased sensation in LUE. Educated on dressing technique. Discussed use of bag on walker.  Pt did practiced with sockaid to help her potentially be more independent with LB dressing.    OT Diagnosis: Hemiplegia non-dominant side  OT Problem List: Decreased strength;Decreased activity tolerance;Impaired balance (sitting and/or standing);Impaired vision/perception;Decreased knowledge of use of DME or AE;Decreased knowledge of precautions;Impaired UE functional use OT Treatment Interventions: Self-care/ADL training;Neuromuscular education;DME and/or AE instruction;Therapeutic activities;Patient/family education;Balance training;Visual/perceptual remediation/compensation   OT Goals(Current goals can be found in the care plan section) Acute Rehab OT Goals Patient Stated Goal: not stated OT Goal Formulation: With patient Time For Goal Achievement: 01/19/14 Potential to Achieve Goals: Good ADL Goals Pt Will Perform Grooming: with supervision;standing;with set-up Pt Will Perform Lower Body Dressing: sit to/from stand;with min assist Pt Will Transfer to Toilet: with min guard assist;ambulating (3 in 1 over commode) Pt Will Perform  Toileting - Clothing Manipulation and hygiene: with supervision;sit  to/from stand  Visit Information  Last OT Received On: 01/12/14 Assistance Needed: +2 History of Present Illness: [name] [name] is a 73 y.o. female with a history of a CVA in 2002, HTN, DM2, and PVD who presents to the Ed with complaints of sudden onset of feeling dizzy and sluggish and then almost falling instead she slid out of her chair to the floor.   She had increased weakness of her left side and her symptoms started at around 7:30pm.   She denied having any Headache or Chest pain.   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       Prior Functioning     Home Living Family/patient expects to be discharged to:: Inpatient rehab Living Arrangements: Spouse/significant other Available Help at Discharge: Family;Available 24 hours/day Type of Home: House Home Access: Ramped entrance Home Layout: One level Home Equipment: Walker - 2 wheels;Shower seat;Electric scooter Prior Function Level of Independence: Needs assistance Gait / Transfers Assistance Needed: Assist to transfer and for ambulation with RW ADL's / Homemaking Assistance Needed: Assist for bathing/dressing and meal prep. Pt able to don shorts/undewear. Comments: Patient legally blind Communication Communication: No difficulties         Vision/Perception Vision - History Baseline Vision: Legally blind   Cognition  Cognition Arousal/Alertness: Awake/alert Behavior During Therapy: WFL for tasks assessed/performed Overall Cognitive Status: Within Functional Limits for tasks assessed    Extremity/Trunk Assessment Upper Extremity Assessment Upper Extremity Assessment: LUE deficits/detail LUE Deficits / Details: LUE weaker compared to RUE LUE Sensation: decreased light touch Lower  Extremity Assessment Lower Extremity Assessment: Defer to PT evaluation     Mobility Bed Mobility Overal bed mobility: Needs Assistance Bed Mobility: Supine to Sit Supine to sit: Min assist General bed mobility comments: Min A to position LUE. Transfers Overall transfer level: Needs assistance Equipment used: Rolling walker (2 wheeled) Transfers: Sit to/from Stand Sit to Stand: Min assist Stand pivot transfers: Min assist General transfer comment: Cues for technique.     Exercise     Balance     End of Session OT - End of Session Equipment Utilized During Treatment: Gait belt;Rolling walker Activity Tolerance: Patient tolerated treatment well Patient left: in chair;with call bell/phone within reach;with chair alarm set;with family/visitor present  Lorri Frederick OTR/L [phone] 01/12/2014, 3:42 PM

## 2014-01-12 NOTE — Progress Notes (Addendum)
PROGRESS NOTE  [name] FullingBetty J [name] ZOX:[id number] DOB: Jun 06, 1941 DOA: 01/09/2014 PCP: [name] [name]  Assessment/Plan: CVA-  MRI/MRA studies done 03/06 + for CVA Seen by Neuro Change to plavix PT.OT and Speech consults - CIR  Fasting Lipids:LDL 141- add statin HbA1c: >10   Fall- Due to #1, And weakness, also has an unsteady gait, PT consult- CIR  Dizziness- Acute on chronic, has hx of previous Cerebellar CVA.    HTN- Continue Metoprolol, LIsinopril, ,Lasix, and Hydralazine Rx. Monitor BPs.   Hyperlipidemia - add statin.   DM2- Check HbA1c, add SSI coverage PRN, change to lantus -spoke at length with patient regarding her DM- does not seem to understand (had large frosty last PM- does not consider that sugar)- will get education -on 70/30 had episode of hypotension; change to lantus and titrate up  PVD- On Gabapentin rx.   Hypokalemia- due to Lasix Rx, Replete K+,. Check Mg- bmp in AM  CKD- baseline 1.4  Code Status: full Family Communication: patient/husband at bedside Disposition Plan:    Consultants:  Neuro  Diabetic educator  Procedures:    Antibiotics:    HPI/Subjective: No new event overnight  Objective: Filed Vitals:   01/12/14 0915  BP: 159/73  Pulse: 78  Temp: 98.6 F (37 C)  Resp: 18   No intake or output data in the 24 hours ending 01/12/14 1201 Filed Weights   01/10/14 0300  Weight: 87.98 kg (193 lb 15.4 oz)    Exam:   General:  cooperative  Cardiovascular: rrr  Respiratory: clear anterior  Abdomen: +Bs, soft  Musculoskeletal: moves all 4 ext   Data Reviewed: Basic Metabolic Panel:  Recent Labs Lab 01/09/14 2016 01/09/14 2042 01/10/14 1100 01/11/14 0520 01/11/14 0740  NA 142 140  --  133*  --   K 3.2* 3.1*  --  4.4  --   CL 97 97  --  91*  --   CO2 31  --   --  28  --   GLUCOSE 80 77  --  478*  --   BUN 24* 24*  --  25*  --   CREATININE 1.45* 1.60*  --  1.32*  --   CALCIUM 9.5  --   --  9.2  --   MG  --    --  1.4*  --  1.8   Liver Function Tests:  Recent Labs Lab 01/09/14 2016  AST 13  ALT 6  ALKPHOS 88  BILITOT 0.2*  PROT 7.9  ALBUMIN 4.0   No results found for this basename: LIPASE, AMYLASE,  in the last 168 hours No results found for this basename: AMMONIA,  in the last 168 hours CBC:  Recent Labs Lab 01/09/14 2016 01/09/14 2042 01/11/14 0520  WBC 6.4  --  6.6  NEUTROABS 3.8  --   --   HGB 13.6 14.6 12.6  HCT 38.0 43.0 35.7*  MCV 85.8  --  86.2  PLT 255  --  213   Cardiac Enzymes: No results found for this basename: CKTOTAL, CKMB, CKMBINDEX, TROPONINI,  in the last 168 hours BNP (last 3 results) No results found for this basename: PROBNP,  in the last 8760 hours CBG:  Recent Labs Lab 01/11/14 0815 01/11/14 1136 01/11/14 1620 01/11/14 2122 01/12/14 0645  GLUCAP 368* 175* 162* 255* 122*    No results found for this or any previous visit (from the past 240 hour(s)).   Studies: No results found.  Scheduled Meds: . atorvastatin  20 mg Oral q1800  . cyclobenzaprine  10 mg Oral Daily  . enoxaparin (LOVENOX) injection  30 mg Subcutaneous Daily  . furosemide  20 mg Oral BID  . gabapentin  300 mg Oral Daily  . glimepiride  4 mg Oral QAC breakfast  . hydrALAZINE  10 mg Oral QID  . insulin aspart  0-15 Units Subcutaneous TID WC  . insulin aspart  0-5 Units Subcutaneous QHS  . insulin glargine  15 Units Subcutaneous BID  . latanoprost  1 drop Both Eyes QHS  . levothyroxine  50 mcg Oral QAC breakfast  . lisinopril  20 mg Oral Daily  . metoprolol tartrate  25 mg Oral BID  . olopatadine  1 drop Both Eyes BID  . pantoprazole  40 mg Oral Daily  . potassium chloride SA  20 mEq Oral BID   Continuous Infusions:  Antibiotics Given (last 72 hours)   None      Principal Problem:   CVA (cerebral infarction) Active Problems:   Peripheral vascular disease, unspecified   Dizziness   Fall   Hypertension   Hyperlipidemia   Type II or unspecified type diabetes  mellitus without mention of complication, not stated as uncontrolled   Hypokalemia    Time spent: 35 min   Sahid Borba  [facility] Pager [phone]. If 7PM-7AM, please contact night-coverage at [email or web address], password [facility] 01/12/2014, 12:01 PM  LOS: 3 days

## 2014-01-12 NOTE — Consult Note (Signed)
Physical Medicine and Rehabilitation Consult  Reason for Consult: Increase in left sided weakness with difficulty walking.  Referring Physician:  Dr. Benjamine Mola   HPI: [name] [name] is a 73 y.o. female with history of HTN, DM, CKD, multiple prior strokes with mild left sided weakness, legally blind; who was admitted on 01/09/14 with acute onset of dizziness with difficulty walking and increase in left sided weakness. MRI brain with small left parietal infarct, remote bilateral lacunar, bilateral cerebellar and basal ganglia infarcts, short segment stenosis distal L-VA just prior to VB junction. 2D echo with EF 60-65% and no wall abnormality. Carotid dopplers without significant ICA stenosis.  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 Patient with resultant left hemiparesis as well as difficulty weight bearing through LLE. MD and rehab team recommending CIR.    Review of Systems  HENT: Negative for hearing loss.   Eyes:       Legally blind  Respiratory: Negative for cough and shortness of breath.   Cardiovascular: Negative for chest pain and palpitations.  Gastrointestinal: Negative for heartburn and nausea.  Genitourinary: Negative for dysuria and urgency.  Musculoskeletal: Negative for myalgias and neck pain.  Neurological: Positive for sensory change (left side numb) and focal weakness. Negative for headaches.    Past Medical History  Diagnosis Date  . Hypertension   . Diabetes mellitus   . Stroke   . Chronic renal insufficiency   . Hyperlipidemia   . Osteopenia   . CVA (cerebral infarction)    Past Surgical History  Procedure Laterality Date  . Eye surgery     Family History  Problem Relation Age of Onset  . Diabetes Mother   . Hypertension Mother    Social History:  Married. Used to work in a Engineer, mining. Per  reports that she has been smoking Cigarettes--1 PPD.  She has a 30 pack-year smoking history. She does not have any smokeless tobacco  history on file. She reports that she drinks alcohol. She reports that she does not use illicit drugs.   Allergies  Allergen Reactions  . Erythromycin   . Iodine     REACTION: hives  . Penicillins     REACTION: hives   Medications Prior to Admission  Medication Sig Dispense Refill  . aspirin EC 81 MG tablet Take 81 mg by mouth daily.      . cyclobenzaprine (FLEXERIL) 10 MG tablet Take 10 mg by mouth daily.      . furosemide (LASIX) 20 MG tablet Take 20 mg by mouth 2 (two) times daily.      Marland Kitchen gabapentin (NEURONTIN) 300 MG capsule Take 300 mg by mouth daily.      Marland Kitchen glimepiride (AMARYL) 4 MG tablet Take 4 mg by mouth daily before breakfast.      . hydrALAZINE (APRESOLINE) 10 MG tablet Take 1 tablet (10 mg total) by mouth 4 (four) times daily.  120 tablet  0  . hydrochlorothiazide (HYDRODIURIL) 25 MG tablet Take 25 mg by mouth daily.      . insulin NPH-regular (NOVOLIN 70/30) (70-30) 100 UNIT/ML injection Inject 40 Units into the skin daily as needed. Blood sugar >200 take insulin      . levothyroxine (SYNTHROID, LEVOTHROID) 50 MCG tablet Take 50 mcg by mouth daily.      Marland Kitchen lisinopril (PRINIVIL,ZESTRIL) 20 MG tablet Take 20 mg by mouth daily.      Marland Kitchen LUMIGAN 0.01 % SOLN Place 1 drop into both eyes  at bedtime.       . metFORMIN (GLUCOPHAGE) 1000 MG tablet Take 1,000 mg by mouth 2 (two) times daily with a meal.      . metoprolol succinate (TOPROL-XL) 25 MG 24 hr tablet Take 25 mg by mouth daily.       . metoprolol tartrate (LOPRESSOR) 25 MG tablet Take 25 mg by mouth 2 (two) times daily.      . naproxen (NAPROSYN) 500 MG tablet Take 1 tablet by mouth 2 (two) times daily.      Marland Kitchen. omeprazole (PRILOSEC) 40 MG capsule Take 40 mg by mouth daily.      Marland Kitchen. PATADAY 0.2 % SOLN Place 1 drop into both eyes daily.       . potassium chloride SA (K-DUR,KLOR-CON) 20 MEQ tablet Take 20 mEq by mouth 2 (two) times daily.        Home: Home Living Family/patient expects to be discharged to:: Inpatient  rehab Living Arrangements: Spouse/significant other Available Help at Discharge: Family;Available 24 hours/day Type of Home: House Home Access: Ramped entrance Home Layout: One level Home Equipment: Walker - 2 wheels;Shower seat;Electric scooter  Functional History: Prior Function Comments: Patient legally blind Functional Status:  Mobility:          ADL:    Cognition: Cognition Overall Cognitive Status: Difficult to assess Orientation Level: Oriented X4 Cognition Arousal/Alertness: Lethargic (Per RN, did not sleep last night) Behavior During Therapy: Flat affect (Falling asleep during conversation) Overall Cognitive Status: Difficult to assess Difficult to assess due to: Level of arousal  Blood pressure 159/73, pulse 78, temperature 98.6 F (37 C), temperature source Oral, resp. rate 18, height 5\' 2"  (1.575 m), weight 87.98 kg (193 lb 15.4 oz), SpO2 96.00%. Physical Exam  Nursing note and vitals reviewed. Constitutional: She is oriented to person, place, and time. She appears well-developed and well-nourished.  HENT:  Head: Normocephalic and atraumatic.  Eyes:  Pinpoint pupils  Neck: Normal range of motion. Neck supple.  Cardiovascular: Normal rate and regular rhythm.   Respiratory: Effort normal and breath sounds normal. No respiratory distress. She has no wheezes.  GI: Soft. Bowel sounds are normal. She exhibits no distension. There is no tenderness.  Musculoskeletal: She exhibits no edema.  Neurological: She is alert and oriented to person, place, and time.  Speech clear. Able to follow basic commands without difficulty. Left side with significant sensory deficits upper and lower 1/2. LUE is 4/5 prox to distally. LLE is 2- HF, 3/5 KE and 3+ at ankle. Sees general shapes, colors. Seems to have reasonable insight and awareness.   Skin: Skin is warm and dry.  Psychiatric: She has a normal mood and affect. Her behavior is normal. Thought content normal.    Results  for orders placed during the hospital encounter of 01/09/14 (from the past 24 hour(s))  GLUCOSE, CAPILLARY     Status: Abnormal   Collection Time    01/11/14  4:20 PM      Result Value Ref Range   Glucose-Capillary 162 (*) 70 - 99 mg/dL  GLUCOSE, CAPILLARY     Status: Abnormal   Collection Time    01/11/14  9:22 PM      Result Value Ref Range   Glucose-Capillary 255 (*) 70 - 99 mg/dL  GLUCOSE, CAPILLARY     Status: Abnormal   Collection Time    01/12/14  6:45 AM      Result Value Ref Range   Glucose-Capillary 122 (*) 70 - 99 mg/dL  GLUCOSE, CAPILLARY     Status: Abnormal   Collection Time    01/12/14 11:39 AM      Result Value Ref Range   Glucose-Capillary 102 (*) 70 - 99 mg/dL   No results found.  Assessment/Plan: Diagnosis: CVA with left hemiparesis and hemisensory loss (not explained by left parietal infarct), may have small subcortical infarct 1. Does the need for close, 24 hr/day medical supervision in concert with the patient's rehab needs make it unreasonable for this patient to be served in a less intensive setting? Yes 2. Co-Morbidities requiring supervision/potential complications: legally blind, htn, dm 3. Due to bladder management, bowel management, safety, skin/wound care, disease management, medication administration, pain management and patient education, does the patient require 24 hr/day rehab nursing? Yes 4. Does the patient require coordinated care of a physician, rehab nurse, PT (1-2 hrs/day, 5 days/week), OT (1-2 hrs/day, 5 days/week) and potentially SLP to address physical and functional deficits in the context of the above medical diagnosis(es)? Yes Addressing deficits in the following areas: balance, endurance, locomotion, strength, transferring, bowel/bladder control, bathing, dressing, feeding, grooming, toileting, speech and psychosocial support 5. Can the patient actively participate in an intensive therapy program of at least 3 hrs of therapy per day at  least 5 days per week? Yes 6. The potential for patient to make measurable gains while on inpatient rehab is excellent 7. Anticipated functional outcomes upon discharge from inpatient rehab are supervision with PT, supervision with OT, mod I with SLP. 8. Estimated rehab length of stay to reach the above functional goals is: 7-10 days 9. Does the patient have adequate social supports to accommodate these discharge functional goals? Yes 10. Anticipated D/C setting: Home 11. Anticipated post D/C treatments: HH therapy 12. Overall Rehab/Functional Prognosis: excellent  RECOMMENDATIONS: This patient's condition is appropriate for continued rehabilitative care in the following setting: CIR Patient has agreed to participate in recommended program. Yes Note that insurance prior authorization may be required for reimbursement for recommended care.  Comment: Rehab Admissions Coordinator to follow up.  Thanks,  Ranelle Oyster, MD, [place] [facility]     01/12/2014

## 2014-01-12 NOTE — Progress Notes (Signed)
Stroke Team Progress Note  HISTORY [name] [name] is a 73 y.o. female with a history of hypertension, diabetes, and previous stroke who was in her normal state of health until about 7:30 PM on 01/09/2014.  At that time, she felt lightheaded and felt generally weak and slid down to the floor. Her husband arrived and called 911. On arrival, she complained of dizziness, but not true vertigo. There was some concern for stroke and therefore an MRI was obtained which showed a small left parietal infarct. She stated that her symptoms were greatly improved compared to when they first started. She denied any new numbness or focal weakness. She had left hip pain that prevented her from raising that leg. She was not administered tpa due to mild symptoms, resolving symptoms. She was admitted to the hospital for further evaluation.  SUBJECTIVE Her husband is at the bedside.  OBJECTIVE Most recent Vital Signs: Filed Vitals:   01/11/14 2123 01/12/14 0122 01/12/14 0449 01/12/14 0915  BP: 174/82 159/63 168/50 159/73  Pulse: 82 79 82 78  Temp: 98.9 F (37.2 C) 99.3 F (37.4 C) 98.5 F (36.9 C) 98.6 F (37 C)  TempSrc: Oral Oral Oral Oral  Resp: 18 18 18 18   Height:      Weight:      SpO2: 98% 100% 96% 96%   CBG (last 3)   Recent Labs  01/11/14 1620 01/11/14 2122 01/12/14 0645  GLUCAP 162* 255* 122*    IV Fluid Intake:     MEDICATIONS  . atorvastatin  20 mg Oral q1800  . cyclobenzaprine  10 mg Oral Daily  . enoxaparin (LOVENOX) injection  30 mg Subcutaneous Daily  . furosemide  20 mg Oral BID  . gabapentin  300 mg Oral Daily  . glimepiride  4 mg Oral QAC breakfast  . hydrALAZINE  10 mg Oral QID  . insulin aspart  0-15 Units Subcutaneous TID WC  . insulin aspart  0-5 Units Subcutaneous QHS  . insulin glargine  15 Units Subcutaneous BID  . latanoprost  1 drop Both Eyes QHS  . levothyroxine  50 mcg Oral QAC breakfast  . lisinopril  20 mg Oral Daily  . metoprolol tartrate  25 mg Oral BID   . olopatadine  1 drop Both Eyes BID  . pantoprazole  40 mg Oral Daily  . potassium chloride SA  20 mEq Oral BID   PRN:  albuterol, guaiFENesin-dextromethorphan, senna-docusate  Diet:  Carb Control thin liquids Activity:  Bedrest DVT Prophylaxis:  Lovenox  CLINICALLY SIGNIFICANT STUDIES Basic Metabolic Panel:   Recent Labs Lab 01/09/14 2016 01/09/14 2042 01/10/14 1100 01/11/14 0520 01/11/14 0740  NA 142 140  --  133*  --   K 3.2* 3.1*  --  4.4  --   CL 97 97  --  91*  --   CO2 31  --   --  28  --   GLUCOSE 80 77  --  478*  --   BUN 24* 24*  --  25*  --   CREATININE 1.45* 1.60*  --  1.32*  --   CALCIUM 9.5  --   --  9.2  --   MG  --   --  1.4*  --  1.8   Liver Function Tests:   Recent Labs Lab 01/09/14 2016  AST 13  ALT 6  ALKPHOS 88  BILITOT 0.2*  PROT 7.9  ALBUMIN 4.0   CBC:   Recent Labs Lab 01/09/14 2016 01/09/14  2042 01/11/14 0520  WBC 6.4  --  6.6  NEUTROABS 3.8  --   --   HGB 13.6 14.6 12.6  HCT 38.0 43.0 35.7*  MCV 85.8  --  86.2  PLT 255  --  213   Coagulation:   Recent Labs Lab 01/09/14 2016  LABPROT 13.1  INR 1.01   Cardiac Enzymes: No results found for this basename: CKTOTAL, CKMB, CKMBINDEX, TROPONINI,  in the last 168 hours Urinalysis:   Recent Labs Lab 01/09/14 2055  COLORURINE YELLOW  LABSPEC 1.017  PHURINE 6.5  GLUCOSEU >1000*  HGBUR NEGATIVE  BILIRUBINUR NEGATIVE  KETONESUR NEGATIVE  PROTEINUR 30*  UROBILINOGEN 0.2  NITRITE NEGATIVE  LEUKOCYTESUR NEGATIVE   Lipid Panel    Component Value Date/Time   CHOL 229* 01/10/2014 0520   TRIG 215* 01/10/2014 0520   HDL 45 01/10/2014 0520   CHOLHDL 5.1 01/10/2014 0520   VLDL 43* 01/10/2014 0520   LDLCALC 141* 01/10/2014 0520   HgbA1C  Lab Results  Component Value Date   HGBA1C 10.4* 01/10/2014    Urine Drug Screen:     Component Value Date/Time   LABOPIA NONE DETECTED 01/09/2014 2054   COCAINSCRNUR NONE DETECTED 01/09/2014 2054   LABBENZ NONE DETECTED 01/09/2014 2054   AMPHETMU  NONE DETECTED 01/09/2014 2054   THCU NONE DETECTED 01/09/2014 2054   LABBARB NONE DETECTED 01/09/2014 2054    Alcohol Level:   Recent Labs Lab 01/09/14 2016  ETH <11    Dg Chest 2 View 01/10/2014    No active disease.    Mr Angiogram Head Wo Contrast 01/10/2014    MRI HEAD:   1. 5 mm acute ischemic infarct within the left periatrial white matter as above.  2. Remote infarcts involving the bilateral cerebellar hemispheres, with additional scattered remote lacunar infarcts involving the bilateral basal ganglia and supratentorial white matter.  3. Moderate atrophy and chronic microvascular ischemic disease within the supratentorial white matter and pons.    MRA HEAD:   1. 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 The right vertebral artery is dominant.  2. Short-segment stenosis of approximately 50% within the right P2 segment.  3. No other high-grade flow-limiting stenosis or proximal branch occlusion identified.  4. No intracranial aneurysm.      2D Echocardiogram  ejection fraction 60-65%. No cardiac source of emboli identified.  Carotid Doppler No evidence of hemodynamically significant internal carotid artery stenosis. Vertebral artery flow is antegrade.   EKG   Sinus rhythm rate 65 beats per minute. For complete results please see formal report.   Therapy Recommendations CIR  Physical Exam   Mental Status:  Patient is awake, alert, oriented to person, place, month, year, and situation.  Immediate and remote memory are intact.  Patient is able to give a clear and coherent history.  No signs of aphasia or neglect  Cranial Nerves:  II: Left lower field cut. Pupils are equal, round, and reactive to light. Discs are difficult to visualize.  III,IV, VI: EOMI without ptosis or diploplia.  V: Facial sensation is diminished on left  VII: Facial movement is symmetric  VIII: hearing is intact to voice  X:  Uvula elevates symmetrically  XI: Shoulder shrug is symmetric.  XII: tongue is midline without atrophy or fasciculations.  Motor:  Tone is normal. Bulk is normal. 5/5 strength was present on the right side, the left she has mild (4+/5)weakness of the left leg  greater than arm. It is unclear, sure weakness in the leg is due to pain.  Sensory:  Sensation is diminished throughout the left side  Deep Tendon Reflexes:  2+ and symmetric in the biceps and patellae.  Cerebellar:  She is able to perform finger-nose-finger with both arms without ataxia when her eyes are closed. With eyes opened, she has difficulty in the left side  Gait: deferred   ASSESSMENT Ms. Erin FullingBetty J Grecco is a 73 y.o. female presenting with weakness, and dizziness. TPA was not administered as the patient's symptoms were very mild and were resolving. An MRI revealed a 5 mm acute ischemic infarct within the left periatrial white matter. Infarct felt to be thrombotic secondary to small vessel disease. On aspirin 81 mg orally every day prior to admission. Now on aspirin 325 mg orally every day for secondary stroke prevention. Patient with resultant mild left hemiparesis. Stroke work up completed.   Diabetes - hemoglobin A1c 10.4  Hyperlipidemia - cholesterol 229; LDL 141 - now on Lipitor 20 mg daily  Hypertension  Multiple previous strokes  Legally blind  Hx previous cerebellar stroke   PVD  CKD, baseline Cr 1.4   Hospital day # 3  TREATMENT/PLAN  Change aspirin to clopidogrel 75 mg orally every day for secondary stroke prevention.  Rehab consulted in place. Ok for transfer there when bed available fro, neuro standpoint  Risk factor modifications - aggressive management of diabetes, hyperlipidemia, and hypertension  No indication for TEE.  No further stroke workup indicated.  Patient has a 10-15% risk of having another stroke over the next year, the highest risk is within 2 weeks of the most recent stroke/TIA  (risk of having a stroke following a stroke or TIA is the same).  Ongoing risk factor control by Primary Care Physician  Stroke Service will sign off. Please call should any needs arise.  Follow up with Dr. Pearlean BrownieSethi, Stroke Clinic, in 2 months.    Annie MainSHARON BIBY, MSN, RN, ANVP-BC, ANP-BC, Lawernce IonGNP-BC [facility] Pager: [phone] 01/12/2014 10:48 AM  I have personally obtained a history, examined the patient, evaluated imaging results, and formulated the assessment and plan of care. I agree with the above.  Delia HeadyPramod Sethi, MD

## 2014-01-12 NOTE — Progress Notes (Signed)
Physical Therapy Treatment Patient Details Name: [name] [name] MRN: [id number] DOB: 08/06/1941 Today's Date: 01/12/2014 Time: 1191-4782 PT Time Calculation (min): 23 min  PT Assessment / Plan / Recommendation  History of Present Illness [name] [name] is a 73 y.o. female with a history of a CVA in 2002, HTN, DM2, and PVD who presents to the Ed with complaints of sudden onset of feeling dizzy and sluggish and then almost falling instead she slid out of her chair to the floor.   She had increased weakness of her left side and her symptoms started at around 7:30pm.   She denied having any Headache or Chest pain.   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   PT Comments   Patient demonstrates progress during today's session. Able to ambulate with assist and tolerated therapeutic activities well. Will continue to progress as tolerated.  Follow Up Recommendations  CIR;Supervision/Assistance - 24 hour     Does the patient have the potential to tolerate intense rehabilitation     Barriers to Discharge        Equipment Recommendations  3in1 (PT)    Recommendations for Other Services Rehab consult  Frequency Min 4X/week   Progress towards PT Goals Progress towards PT goals: Progressing toward goals  Plan Current plan remains appropriate    Precautions / Restrictions Precautions Precautions: Fall Restrictions Weight Bearing Restrictions: No   Pertinent Vitals/Pain No pain at this time    Mobility  Bed Mobility Overal bed mobility: Needs Assistance;+2 for physical assistance Bed Mobility: Supine to Sit;Sit to Supine Supine to sit: Min assist Sit to supine: Min assist General bed mobility comments: Min A to position LUE. Transfers Overall transfer level: Needs assistance Equipment used: Rolling walker (2  wheeled) Transfers: Sit to/from [name] Sit to Stand: Min assist Stand pivot transfers: Min assist General transfer comment: Cues for technique. Ambulation/Gait Ambulation/Gait assistance: Mod assist Ambulation Distance (Feet): 18 Feet Assistive device: Rolling walker (2 wheeled) Gait Pattern/deviations: Shuffle;Decreased stride length;Drifts right/left;Trunk flexed Gait velocity: decreased Gait velocity interpretation: <1.8 ft/sec, indicative of risk for recurrent falls General Gait Details: assist required to navigate in room (patient legally blind but states that she can see some) Modified Rankin (Stroke Patients Only) Pre-Morbid Rankin Score: Moderate disability Modified Rankin: Severe disability    Exercises     PT Diagnosis:    PT Problem List:   PT Treatment Interventions:     PT Goals (current goals can now be found in the care plan section) Acute Rehab PT Goals Patient Stated Goal: to get home PT Goal Formulation: With patient/family Time For Goal Achievement: 01/25/14 Potential to Achieve Goals: Good  Visit Information  Last PT Received On: 01/12/14 Assistance Needed: +2 History of Present Illness: [name] [name] is a 73 y.o. female with a history of a CVA in 2002, HTN, DM2, and PVD who presents to the Ed with complaints of sudden onset of feeling dizzy and sluggish and then almost falling instead she slid out of her chair to the floor.   She had increased weakness of her left side and her symptoms started at around 7:30pm.   She denied having any Headache or Chest pain.   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    Subjective Data  Subjective: I am just a little tired but im ready Patient Stated Goal: to get home   Cognition  Cognition Arousal/Alertness: Awake/alert Behavior  During Therapy: WFL for tasks assessed/performed Overall Cognitive Status: Within Functional Limits for tasks assessed    Balance  Balance Overall balance assessment: Needs assistance Sitting-balance support: Feet supported Sitting balance-Leahy Scale: Poor Sitting balance - Comments: Patient sat EOB x 6 minutes.  Patient leaning to left and posteriorly. Postural control: Posterior lean Standing balance support: Bilateral upper extremity supported;During functional activity (improving balance) Standing balance-Leahy Scale: Poor General Comments General comments (skin integrity, edema, etc.): EOB trunk control activities perform with patient inculding opposing side on elbow positioning for facilitation of midline preservation during static sitting EOB, Dynamic trunk control with reaching performed (difficulty atending to URQ)  Decreased attention to tasks noted throughout session. Multiple standing trials performed with lateral weight shifting.  End of Session PT - End of Session Equipment Utilized During Treatment: Gait belt Activity Tolerance: Patient limited by lethargy;Patient limited by fatigue Patient left: in chair;with call bell/phone within reach;with family/visitor present Nurse Communication: Mobility status   GP     Fabio AsaWerner, Dutch Ing J 01/12/2014, 5:03 PM Charlotte Crumbevon Chin Wachter, PT DPT  [phone]

## 2014-01-12 NOTE — Progress Notes (Signed)
UR complete.  Sharie Amorin RN, MSN 

## 2014-01-13 DIAGNOSIS — E785 Hyperlipidemia, unspecified: Secondary | ICD-10-CM

## 2014-01-13 DIAGNOSIS — I10 Essential (primary) hypertension: Secondary | ICD-10-CM

## 2014-01-13 DIAGNOSIS — E118 Type 2 diabetes mellitus with unspecified complications: Secondary | ICD-10-CM

## 2014-01-13 DIAGNOSIS — IMO0002 Reserved for concepts with insufficient information to code with codable children: Secondary | ICD-10-CM

## 2014-01-13 DIAGNOSIS — I6350 Cerebral infarction due to unspecified occlusion or stenosis of unspecified cerebral artery: Secondary | ICD-10-CM

## 2014-01-13 DIAGNOSIS — E1165 Type 2 diabetes mellitus with hyperglycemia: Secondary | ICD-10-CM

## 2014-01-13 LAB — GLUCOSE, CAPILLARY
GLUCOSE-CAPILLARY: 50 mg/dL — AB (ref 70–99)
GLUCOSE-CAPILLARY: 137 mg/dL — AB (ref 70–99)
GLUCOSE-CAPILLARY: 66 mg/dL — AB (ref 70–99)
Glucose-Capillary: 57 mg/dL — ABNORMAL LOW (ref 70–99)
Glucose-Capillary: 157 mg/dL — ABNORMAL HIGH (ref 70–99)
Glucose-Capillary: 115 mg/dL — ABNORMAL HIGH (ref 70–99)
Glucose-Capillary: 159 mg/dL — ABNORMAL HIGH (ref 70–99)

## 2014-01-13 MED ORDER — INSULIN GLARGINE 100 UNIT/ML ~~LOC~~ SOLN
5.0000 [IU] | Freq: Two times a day (BID) | SUBCUTANEOUS | Status: DC
  Administered 2014-01-13 – 2014-01-16 (×6): 5 [IU] via SUBCUTANEOUS
  Filled 2014-01-13 (×8): qty 0.05

## 2014-01-13 MED ORDER — INSULIN GLARGINE 100 UNIT/ML ~~LOC~~ SOLN
10.0000 [IU] | Freq: Two times a day (BID) | SUBCUTANEOUS | Status: DC
  Filled 2014-01-13 (×2): qty 0.1

## 2014-01-13 MED ORDER — ACETAMINOPHEN 325 MG PO TABS
650.0000 mg | ORAL_TABLET | Freq: Four times a day (QID) | ORAL | Status: DC | PRN
  Administered 2014-01-13: 650 mg via ORAL
  Filled 2014-01-13: qty 2

## 2014-01-13 MED ORDER — DEXTROSE 50 % IV SOLN
INTRAVENOUS | Status: AC
  Administered 2014-01-13: 25 mL
  Filled 2014-01-13: qty 50

## 2014-01-13 NOTE — Progress Notes (Signed)
Inpatient Diabetes Program Recommendations  AACE/ADA: New Consensus Statement on Inpatient Glycemic Control (2013)  Target Ranges:  Prepandial:   less than 140 mg/dL      Peak postprandial:   less than 180 mg/dL (1-2 hours)      Critically ill patients:  140 - 180 mg/dL   Inpatient Diabetes Program Recommendations HgbA1C: =10.4 Diabetes Coordinator attempted to meet with patient to discuss elevated A1C and diabetes management at home.  Patient was sleeping and had a sitter in the room.  Patient's husband was also present.  Patient likely going to IP rehab. Will continue to follow until a more appropriate time.  Thank you  Piedad ClimesGina Rhylen Pulido BSN, RN,CDE Inpatient Diabetes Coordinator [phone] (team pager)

## 2014-01-13 NOTE — Progress Notes (Signed)
Inpt. Rehab  I met with the patient and her husband at the bedside to begin discussions of post acute rehab options.  Spoke with [facility], PT who says pt. Is more cognitively impaired/confused today.  Also spoke with RN who reports same.  Provided husband with a Rehab handbook and told him I would check on pt. tomorrow.  My sense is that pt. will likely be most appropriate for SNF level therapies at this time.  If she is improved in the am, I will submit for [facility] insurance authorization, however they typically will pay only for SNF.  Please call if questions.  Thanks!    [place] Admissions Coordinator Cell [phone] Office [phone]

## 2014-01-13 NOTE — Progress Notes (Signed)
Hypoglycemic Event  CBG: 50  Treatment: 15 GM carbohydrate snack  Symptoms: None  Follow-up CBG: Time:0715 CBG Result:57, given 15GM carb snack again, will recheck cbg  Possible Reasons for Event: Medication regimen: Lantus bid  Comments/MD notified:Per protocol    Terrilyn SaverHopper, Arnett Galindez Anderson  Remember to initiate Hypoglycemia Order Set & complete

## 2014-01-13 NOTE — Progress Notes (Signed)
Attempted to meet with pt for diet education regarding diabetes. Pt asleep at time of visit. Left handout, "Carbohydrate Counting for Vegetarians with Diabetes" at bedside with RD contact information. Will attempt to meet with pt tomorrow morning.  Ian Malkineanne Barnett RD, LDN Pager: [phone]

## 2014-01-13 NOTE — Progress Notes (Addendum)
PROGRESS NOTE  [name] [name] DOB: 24-Feb-1941 DOA: 01/09/2014 PCP: Alva Garnet., MD  [name] [name] is a 73 y.o. female with a history of a CVA in 2002, HTN, DM2, and PVD who presents to the Ed with complaints of sudden onset of feeling dizzy and sluggish and then almost falling instead she slid out of her chair to the floor. She had increased weakness of her left side and her symptoms started at around 7:30pm. She denied having any Headache or Chest pain. 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 She was referred for medical admission.  She was found to have CVA- seen by neuro and is awaiting CIR placement   Assessment/Plan: CVA-  MRI/MRA studies done 03/06 + for CVA Seen by Neuro Change to plavix PT/OT and Speech consults - CIR  Fasting Lipids:LDL 141- add statin HbA1c: >10   Fall- Due to #1, And weakness, also has an unsteady gait, PT consult- CIR  Dizziness- Acute on chronic, has hx of previous Cerebellar CVA.    HTN- Continue Metoprolol, LIsinopril, ,Lasix, and Hydralazine Rx. Monitor BPs.   Hyperlipidemia - add statin.   DM2- Check HbA1c, add SSI coverage PRN, change to lantus -spoke at length with patient regarding her DM- does not seem to understand (had large frosty - does not consider that sugar)- will get education -on 70/30 had episode of hypotension; change to lantus and titrate up, d/c orals  PVD- On Gabapentin rx.   Hypokalemia- due to Lasix Rx, repleated  CKD- baseline 1.4  Code Status: full Family Communication: patient/husband at bedside Disposition Plan:    Consultants:  Neuro  Diabetic educator  Procedures:    Antibiotics:    HPI/Subjective: Await CIR approval  Objective: Filed Vitals:   01/13/14 1121  BP: 121/84  Pulse: 51  Temp: 98.1 F (36.7 C)    Resp: 18    Intake/Output Summary (Last 24 hours) at 01/13/14 1256 Last data filed at 01/13/14 0845  Gross per 24 hour  Intake    200 ml  Output      0 ml  Net    200 ml   Filed Weights   01/10/14 0300  Weight: 87.98 kg (193 lb 15.4 oz)    Exam:   General:  cooperative  Cardiovascular: rrr  Respiratory: clear anterior  Abdomen: +Bs, soft  Musculoskeletal: moves all 4 ext   Data Reviewed: Basic Metabolic Panel:  Recent Labs Lab 01/09/14 2016 01/09/14 2042 01/10/14 1100 01/11/14 0520 01/11/14 0740  NA 142 140  --  133*  --   K 3.2* 3.1*  --  4.4  --   CL 97 97  --  91*  --   CO2 31  --   --  28  --   GLUCOSE 80 77  --  478*  --   BUN 24* 24*  --  25*  --   CREATININE 1.45* 1.60*  --  1.32*  --   CALCIUM 9.5  --   --  9.2  --   MG  --   --  1.4*  --  1.8   Liver Function Tests:  Recent Labs Lab 01/09/14 2016  AST 13  ALT 6  ALKPHOS 88  BILITOT 0.2*  PROT 7.9  ALBUMIN 4.0   No results found for this basename: LIPASE,  AMYLASE,  in the last 168 hours No results found for this basename: AMMONIA,  in the last 168 hours CBC:  Recent Labs Lab 01/09/14 2016 01/09/14 2042 01/11/14 0520  WBC 6.4  --  6.6  NEUTROABS 3.8  --   --   HGB 13.6 14.6 12.6  HCT 38.0 43.0 35.7*  MCV 85.8  --  86.2  PLT 255  --  213   Cardiac Enzymes: No results found for this basename: CKTOTAL, CKMB, CKMBINDEX, TROPONINI,  in the last 168 hours BNP (last 3 results) No results found for this basename: PROBNP,  in the last 8760 hours CBG:  Recent Labs Lab 01/12/14 2134 01/13/14 0638 01/13/14 0717 01/13/14 0844 01/13/14 1124  GLUCAP 184* 50* 57* 137* 157*    No results found for this or any previous visit (from the past 240 hour(s)).   Studies: No results found.  Scheduled Meds: . atorvastatin  20 mg Oral q1800  . cyclobenzaprine  10 mg Oral Daily  . enoxaparin (LOVENOX) injection  30 mg Subcutaneous Daily  . furosemide  20 mg Oral BID  . gabapentin  300  mg Oral Daily  . hydrALAZINE  10 mg Oral QID  . insulin aspart  0-15 Units Subcutaneous TID WC  . insulin aspart  0-5 Units Subcutaneous QHS  . insulin glargine  10 Units Subcutaneous BID  . latanoprost  1 drop Both Eyes QHS  . levothyroxine  50 mcg Oral QAC breakfast  . lisinopril  20 mg Oral Daily  . metoprolol tartrate  25 mg Oral BID  . olopatadine  1 drop Both Eyes BID  . pantoprazole  40 mg Oral Daily  . potassium chloride SA  20 mEq Oral BID   Continuous Infusions:  Antibiotics Given (last 72 hours)   None      Principal Problem:   CVA (cerebral infarction) Active Problems:   Peripheral vascular disease, unspecified   Dizziness   Fall   Hypertension   Hyperlipidemia   Type II or unspecified type diabetes mellitus without mention of complication, not stated as uncontrolled   Hypokalemia    Time spent: 35 min   [name] [name]  [facility] Pager [phone]. If 7PM-7AM, please contact night-coverage at [email or web address], password [email or web address] 01/13/2014, 12:56 PM  LOS: 4 days

## 2014-01-13 NOTE — Progress Notes (Signed)
Hypoglycemic Event  CBG: 66  Treatment: D50 IV 25 mL  Symptoms: Nervous/irritable  Follow-up CBG: Time:1645 CBG Result:115  Possible Reasons for Event: Medication regimen: lantus  Comments/MD notified: Dr. Benjamine MolaVann lantus changed from 10 units to 5 units    [name] [name]  Remember to initiate Hypoglycemia Order Set & complete

## 2014-01-13 NOTE — Evaluation (Signed)
Speech Language Pathology Evaluation Patient Details Name: [name] [name] MRN: [id number] DOB: 04/13/1941 Today's Date: 01/13/2014 Time: 4098-1191 SLP Time Calculation (min): 14 min  Problem List:  Patient Active Problem List   Diagnosis Date Noted  . Stroke 01/10/2014  . CVA (cerebral infarction) 01/10/2014  . Dizziness 01/10/2014  . Fall 01/10/2014  . Type II or unspecified type diabetes mellitus without mention of complication, not stated as uncontrolled 01/10/2014  . Hypokalemia 01/10/2014  . Hypertension   . Hyperlipidemia   . Peripheral vascular disease, unspecified 05/13/2013  . Pain in limb 05/13/2013   Past Medical History:  Past Medical History  Diagnosis Date  . Hypertension   . Diabetes mellitus   . Stroke   . Chronic renal insufficiency   . Hyperlipidemia   . Osteopenia   . CVA (cerebral infarction)    Past Surgical History:  Past Surgical History  Procedure Laterality Date  . Eye surgery     HPI:  73 y.o. female with a history of a CVA in 2002, HTN, DM2, and PVD who presents to the Ed with complaints of sudden onset of feeling dizzy and sluggish and then almost falling instead she slid out of her chair to the floor. She had increased weakness of her left side and her symptoms started at around 7:30pm. 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    Assessment / Plan / Recommendation Clinical Impression  Pt. assessed for speech-language-cognitive function without family present.  Pt. reports living with husband [name] [name]) who is responsible for finances, shopping, cooking, assist pt. with medication.  Language abilities are intact and speech intelligible/functional.  She demonstrated challenges in working memory and emergent awareness of surroundings intermittently with verbalization of wanting to "feed the cats by the back door." SLP not recommending ST in acute care, however she would  benefit from continued diagnostic assessment in next venue of care (CIR?) to assist in determining intervention.      SLP Assessment  All further Speech Lanaguage Pathology  needs can be addressed in the next venue of care    Follow Up Recommendations  Inpatient Rehab    Frequency and Duration        Pertinent Vitals/Pain WDL       SLP Evaluation Prior Functioning  Cognitive/Linguistic Baseline: Information not available (suspect memory deficits) Type of Home: House  Lives With: Spouse   Cognition  Overall Cognitive Status: Difficult to assess (suspect memory impairments, no family present) Arousal/Alertness: Awake/alert Orientation Level: Oriented X4 Attention: Sustained Sustained Attention: Appears intact Memory:  (suspect deficits) Awareness: Impaired Awareness Impairment: Anticipatory impairment;Emergent impairment Problem Solving: Impaired Problem Solving Impairment: Verbal basic    Comprehension  Auditory Comprehension Overall Auditory Comprehension: Appears within functional limits for tasks assessed Visual Recognition/Discrimination Discrimination: Not tested Reading Comprehension Reading Status:  (legally blind, not assessed)    Expression Expression Primary Mode of Expression: Verbal Verbal Expression Overall Verbal Expression: Appears within functional limits for tasks assessed Initiation: No impairment Level of Generative/Spontaneous Verbalization: Conversation Repetition: No impairment Naming: No impairment Pragmatics: No impairment Written Expression Dominant Hand: Right Written Expression: Not tested   Oral / Motor Oral Motor/Sensory Function Overall Oral Motor/Sensory Function: Appears within functional limits for tasks assessed Motor Speech Overall Motor Speech: Appears within functional limits for tasks assessed Respiration: Within functional limits Phonation: Normal Resonance: Within functional limits Intelligibility: Intelligible Motor  Planning: Witnin functional limits   GO  Breck CoonsLisa Willis RicevilleLitaker M.Ed [employer]CCC-SLP Pager [phone]  01/13/2014

## 2014-01-13 NOTE — Progress Notes (Signed)
Physical Therapy Treatment Patient Details Name: [name] [name] MRN: [id number] DOB: 12-10-1940 Today's Date: 01/13/2014 Time: 7829-5621 PT Time Calculation (min): 18 min  PT Assessment / Plan / Recommendation  History of Present Illness [name] [name] is a 73 y.o. female with a history of a CVA in 2002, HTN, DM2, and PVD who presents to the Ed with complaints of sudden onset of feeling dizzy and sluggish and then almost falling instead she slid out of her chair to the floor.   She had increased weakness of her left side and her symptoms started at around 7:30pm.   She denied having any Headache or Chest pain.   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   PT Comments   Patient with significant change in mental status compared to prior session with this Clinical research associate. Patient significantly more confused and also agitated and paranoid (see cognitive comments).  Did progress ambulation distance but was very impulsive and not following commands for safety or instruction. Unsafe to continue further session today based on patient behavior and cognition. Nsg aware. Will continue to see as indicated.  Follow Up Recommendations  CIR;Supervision/Assistance - 24 hour           Equipment Recommendations  3in1 (PT)    Recommendations for Other Services Rehab consult  Frequency Min 4X/week   Progress towards PT Goals Progress towards PT goals: Progressing toward goals  Plan Current plan remains appropriate    Precautions / Restrictions Precautions Precautions: Fall Restrictions Weight Bearing Restrictions: No   Pertinent Vitals/Pain NAD    Mobility  Bed Mobility Overal bed mobility: Needs Assistance;+2 for physical assistance Bed Mobility: Supine to Sit;Sit to Supine Supine to sit: Min assist Sit to supine: Min assist General bed  mobility comments: Min A to position LUE. Transfers Overall transfer level: Needs assistance Equipment used: Rolling walker (2 wheeled) Transfers: Sit to/from Stand Sit to Stand: Min assist Stand pivot transfers: Min assist General transfer comment: impulsive with mobility not follow commands or tasks Ambulation/Gait Ambulation/Gait assistance: Mod assist;+2 physical assistance Ambulation Distance (Feet): 80 Feet Assistive device: 2 person hand held assist Gait Pattern/deviations: Shuffle;Decreased stride length;Drifts right/left;Trunk flexed Gait velocity: decreased Gait velocity interpretation: <1.8 ft/sec, indicative of risk for recurrent falls General Gait Details: assist required to navigate in room (patient legally blind but states that she can see some) Modified Rankin (Stroke Patients Only) Pre-Morbid Rankin Score: Moderate disability Modified Rankin: Severe disability      PT Goals (current goals can now be found in the care plan section) Acute Rehab PT Goals Patient Stated Goal: to get home PT Goal Formulation: With patient/family Time For Goal Achievement: 01/25/14 Potential to Achieve Goals: Fair  Visit Information  Last PT Received On: 01/13/14 Assistance Needed: +2 History of Present Illness: [name] [name] is a 73 y.o. female with a history of a CVA in 2002, HTN, DM2, and PVD who presents to the Ed with complaints of sudden onset of feeling dizzy and sluggish and then almost falling instead she slid out of her chair to the floor.   She had increased weakness of her left side and her symptoms started at around 7:30pm.   She denied having any Headache or Chest pain.   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    Subjective Data  Subjective: patient very agitated and confused this afternoon Patient Stated  Goal: to get home   Cognition  Cognition Arousal/Alertness: Awake/alert Behavior During Therapy: Agitated;Anxious;Impulsive Overall Cognitive Status: Impaired/Different from baseline Area of Impairment: Orientation;Attention;Following commands;Safety/judgement;Awareness;Problem solving Orientation Level: Disoriented to;Place;Time;Situation Current Attention Level: Sustained Following Commands: Follows one step commands with increased time;Follows multi-step commands inconsistently Safety/Judgement: Decreased awareness of safety;Decreased awareness of deficits Awareness: Intellectual Problem Solving: Requires verbal cues;Requires tactile cues General Comments: patient very confused compared to prior sessions. Patient agitated today and appears very paranoid.  Making statements about perole taking her purse, people trying to get her etc.      Balance  Balance Sitting balance-Leahy Scale: Poor Sitting balance - Comments: Patient sat EOB x 6 minutes.  Patient leaning to left and posteriorly. Standing balance-Leahy Scale: Poor  End of Session PT - End of Session Equipment Utilized During Treatment: Gait belt Activity Tolerance: Patient limited by lethargy;Patient limited by fatigue Patient left: in chair;with call bell/phone within reach;with nursing/sitter in room;with family/visitor present Nurse Communication: Mobility status (patient with significant AMS compared to yesterday)   GP     [name] [name], [name] [name] 01/13/2014, 3:45 PM [name] [name], PT DPT  [phone]

## 2014-01-14 ENCOUNTER — Inpatient Hospital Stay (HOSPITAL_COMMUNITY): Payer: Medicare HMO

## 2014-01-14 LAB — GLUCOSE, CAPILLARY
GLUCOSE-CAPILLARY: 65 mg/dL — AB (ref 70–99)
GLUCOSE-CAPILLARY: 77 mg/dL (ref 70–99)
GLUCOSE-CAPILLARY: 220 mg/dL — AB (ref 70–99)
Glucose-Capillary: 250 mg/dL — ABNORMAL HIGH (ref 70–99)
Glucose-Capillary: 149 mg/dL — ABNORMAL HIGH (ref 70–99)

## 2014-01-14 LAB — MAGNESIUM: Magnesium: 1.9 mg/dL (ref 1.5–2.5)

## 2014-01-14 LAB — BASIC METABOLIC PANEL
BUN: 17 mg/dL (ref 6–23)
CHLORIDE: 102 meq/L (ref 96–112)
CO2: 30 mEq/L (ref 19–32)
CREATININE: 1.4 mg/dL — AB (ref 0.50–1.10)
Calcium: 9.1 mg/dL (ref 8.4–10.5)
GFR calc Af Amer: 42 mL/min — ABNORMAL LOW (ref >90)
GFR calc non Af Amer: 36 mL/min — ABNORMAL LOW (ref >90)
GLUCOSE: 79 mg/dL (ref 70–99)
POTASSIUM: 4.8 meq/L (ref 3.7–5.3)
Sodium: 143 mEq/L (ref 137–147)

## 2014-01-14 LAB — URINALYSIS, ROUTINE W REFLEX MICROSCOPIC
BILIRUBIN URINE: NEGATIVE
Glucose, UA: NEGATIVE mg/dL
KETONES UR: NEGATIVE mg/dL
Nitrite: NEGATIVE
PH: 7.5 (ref 5.0–8.0)
Protein, ur: 30 mg/dL — AB
Specific Gravity, Urine: 1.01 (ref 1.005–1.030)
UROBILINOGEN UA: 1 mg/dL (ref 0.0–1.0)

## 2014-01-14 LAB — CBC
HEMATOCRIT: 34.1 % — AB (ref 36.0–46.0)
HEMOGLOBIN: 11.7 g/dL — AB (ref 12.0–15.0)
MCH: 30.4 pg (ref 26.0–34.0)
MCHC: 34.3 g/dL (ref 30.0–36.0)
MCV: 88.6 fL (ref 78.0–100.0)
Platelets: 225 10*3/uL (ref 150–400)
RBC: 3.85 MIL/uL — ABNORMAL LOW (ref 3.87–5.11)
RDW: 13 % (ref 11.5–15.5)
WBC: 4 10*3/uL (ref 4.0–10.5)

## 2014-01-14 LAB — URINE MICROSCOPIC-ADD ON

## 2014-01-14 MED ORDER — LORAZEPAM 2 MG/ML IJ SOLN
0.5000 mg | Freq: Four times a day (QID) | INTRAMUSCULAR | Status: DC | PRN
  Administered 2014-01-14: 1 mg via INTRAVENOUS
  Filled 2014-01-14 (×2): qty 1

## 2014-01-14 NOTE — Plan of Care (Signed)
Leavy CellaJasmine, Nurse Tech called nurse into the room stating pt is trying to get OOB and stating she is going home. Nurse went to assist pt. Told pt she is not going home today. Nurse tried to get pt back to bed. Pt states she wants to go walk. Walker given to pt. Nurse tried to put safety belt on pt and then pt got really combative and grabbed the safety belt from nurses hand and threw it across the room. Then pt started to lift walker up and states dont you get near me. Charge nurse called and security called. Nurse attempted to call pts spouse but was unsuccessful. Will cont to monitor.

## 2014-01-14 NOTE — Progress Notes (Signed)
  RD consulted for nutrition education regarding diabetes.   Lab Results  Component Value Date   HGBA1C 10.4* 01/10/2014    RD provided "Carbohydrate Counting for Vegetarians with Diabetes" handout from the Academy of Nutrition and Dietetics. Discussed different food groups and their effects on blood sugar, emphasizing carbohydrate-containing foods. Provided list of carbohydrates and recommended serving sizes of common foods. Reviewed pt's dietary recall and dicussed ways to decrease intake of carbs/sugar and discussed low carb foods that pt can eat at meals and snacks.   Provided examples of ways to balance meals/snacks and encouraged intake of high-fiber, whole grain complex carbohydrates. Teach back method used. After reviewing diet education pt was able to identify foods/drinks that she can eliminate from her diet or avoid. Pt reports making lemonade with sugar at home and states she will now use Splenda instead. Pt reports eating a lot of fruit daily and consuming Frosted Flakes at breakfast; pt states she will try to eat more vegetables, cut down on the amount of fruit, and she will eat cheerios instead of frosted flakes. Pt also reports chewing gum that has sugar- she plans to switch to sugar-free gum.   Expect good compliance.  Body mass index is 35.47 kg/(m^2). Pt meets criteria for Obesity based on current BMI.  Current diet order is Carb Modified, patient is consuming approximately 50-100% of meals at this time. Labs and medications reviewed. No further nutrition interventions warranted at this time. RD contact information provided. If additional nutrition issues arise, please re-consult RD.  Ian Malkineanne Barnett RD, LDN Inpatient Clinical Dietitian Pager: [phone] After Hours Pager: [phone]

## 2014-01-14 NOTE — Care Management Note (Signed)
    Page 1 of 2   01/19/2014     10:41:40 AM   CARE MANAGEMENT NOTE 01/19/2014  Patient:  [name] [name], [name] [name]   Account Number:  [id number]  Date Initiated:  01/13/2014  Documentation initiated by:  Lorne Skeens  Subjective/Objective Assessment:   patient admitted with CVA. Lives at home with spouse. Uses [employer] duty agency for personal assistance.     Action/Plan:   Will follow for discharge needs   Anticipated DC Date:     Anticipated DC Plan:  IP [place]  CM consult      Choice offered to / List presented to:  C-1 Patient   DME arranged  3-N-1  WALKER      DME agency  [employer] arranged  HH-2 PT  HH-3 OT  HH-6 SOCIAL WORKER      Status of service:  Completed, signed off Medicare Important Message given?   (If response is "NO", the following Medicare IM given date fields will be blank) Date Medicare IM given:   Date Additional Medicare IM given:    Discharge Disposition:  [place]  Per UR Regulation:  Reviewed for med. necessity/level of care/duration of stay  If discussed at [place] of Stay Meetings, dates discussed:    Comments:  01/19/14 [place], MSN, CM- Recieved message from weekend CM stating that patient's equipment was not delivered and order reciept could not be verified as [employer]'s computer system was down.  CM spoke with patient today, who verified that she had been contacted by [place] and equipment is to be delivered this morning.   01/16/14 [place], MSN, CM- Orders recieved for DME.  Orders faxed to [place] for home delivery.  Patient is discharging home at this time.   01/16/14 [place], MSN, CM- Per Dr Biagio Borg request due to multple falls this admission, CM met with patient and wife to verify discharge disposition. Patient is still refusing SNF placement and husband is in agreement with this.  CSW and Dr Grandville Silos both  updated. Mariann Laster with [facility] was updated on plans for discharge today.   01/15/14 [place], MSN, CM- [facility] is unable to accept patient.  Patient has asked that [facility] be contacted.  CM spoke with Juliann Pulse with [facility], who has accepted the patient.  Requested information was faxed to [phone].   01/15/14 [place], MSN, CM- CM called Davida with [facility], who states that they have not yet heard if they are able to accept the patient.   01/15/14 [place], MSN, CM- Met with patient and spouse to discuss home health needs.  Patient is alert and oriented at this time.  Patient has requested to use [facility] or [facility] for [facility] PT/OT/CSW. Janeece Riggers is unable to work with [employer].  CM spoke with Maudie Mercury at [place] and requested clinicals were faxed. Awaiting return call regarding referral acceptance. Patient and husband updated.   01/14/14 [place], MSN, CM- Recieved word from [facility] that patient prefers to discharge home as CIR is not an option. CM attempted to meet with patient and husband, but patient was extremely agitated and combative toward husband and staff.  CM introduced self to husband and will follow when patient's condition is more appropriate.

## 2014-01-14 NOTE — Progress Notes (Signed)
Inpt. Rehab  In to meet with patient and her husband again this am.  Pt. Is alert and oriented but confused.  Husband did not provide any input regarding pt's post acute rehabilitation.  Uncertain of his abilities as pt's primary caregiver.   At this  time, feel pt. would be best served with SNF level therapies for her rehab recovery.   Will sign off.  Please call if questions.    Weldon PickingSusan Kila Godina PT Inpatient Rehab Admissions Coordinator Cell [phone] Office [phone]

## 2014-01-14 NOTE — Progress Notes (Addendum)
(  Late entry, should be doc'd in 1430, see 1430 column)

## 2014-01-14 NOTE — Plan of Care (Signed)
Dr Janee Mornhompson notified and MD was on the unit. He went to talk to pt and pt was still combative. MD states he will see pts chart shortly and went away. Charge nurse and nurse continued to stay with pt. Pt is still combative. Pt husband arrived to the unit. States he went home to pick up some things per pt request. Updated pt husband the situation. Husband walked into the room and pt started yelling and lifted up the walker. Nurse told spouse to get out of the room and he did. Pt sitting on the side of the bed with walker in front of her. Dr Janee Mornthompson called again to get order for anti anxiety med. Waiting for MD to call back. Got order for ativan. Nurse went to get ativan while spouse and security guard are both outside of the room. Nurse was drawing the ativan outside of the room. Pt got up and starting hitting the door with the walker. Pt started falling backwards and landed on the floor. Pt hit the back of her head on the sink. Nurse Gust BroomsZahra witnessed the fall. Nurse and security guards got pt up and placed pt to bed. Pt was still combative. Restraints placed on pt. Dr Janee Mornhompson called and notified. Vs obtained. Ativan given. Spouse at bedside. Will cont to monitor pt.

## 2014-01-14 NOTE — Clinical Social Work Psychosocial (Signed)
Clinical Social Work Department °BRIEF PSYCHOSOCIAL ASSESSMENT °01/14/2014 ° °Patient:  [name] [name],[name] [name]     Account Number:  [id number]     Admit date:  01/09/2014 ° °Clinical Social Worker:  [place],EMILY, LCSWA  Date/Time:  01/14/2014 12:26 PM ° °Referred by:  Physician  Date Referred:  01/14/2014 °Referred for  °SNF Placement  ° °Other Referral:   °none.  ° °Interview type:  Patient °Other interview type:   °Pt's husband (Mr. Hislop) was also present at bedside.  ° ° °PSYCHOSOCIAL DATA °Living Status:  HUSBAND °Admitted from facility:   °Level of care:   °Primary support name:  Mr. Grisby (how pt introduced him) °Primary support relationship to patient:  SPOUSE °Degree of support available:   °strong.  ° ° °CURRENT CONCERNS °Current Concerns  °Post-Acute Placement  ° °Other Concerns:   °none.  ° ° °SOCIAL WORK ASSESSMENT / PLAN °CSW met with pt at bedside to discuss discharge disposition. CSW explained CSW role at [facility] and pt stated she was agreeable to speaking with CSW. Pt said she was aware of insurance denial for CIR, but did not understand why. CSW and pt discussed second option of SNF for short-term rehabilitation. Pt had a lot of questions regarding her husband being able to come and "live with" her at the SNF. CSW informed pt that her husband was welcome to come and visit, but would not have his own bed at the facility. Pt stated that she would rather return home and be with her husband because "we have already worked in our lives, it is our turn to live and have fun." Pt also stated that being away from her husband was not something she was interesetd in doing.  °  °CSW and pt discussed support at home. Pt stated that her husband is at home throughout the day, and that she also receives care from [facility].  °  °CSW consulted with RNCM regarding information above. RNCM to speak with MD regarding pt discharging home with home health services at time of discharge. CSW signing off.  ° °Assessment/plan  status:  Psychosocial Support/Ongoing Assessment of Needs °Other assessment/ plan:   °none.  ° °Information/referral to community resources:   °Pt was not agreeable to SNF search at this time, stating that she would rather return home with her husband and help from [facility].  ° ° °PATIENT’S/FAMILY’S RESPONSE TO PLAN OF CARE: ° ° ° ° ° °Emily [place], LCSWA °Clinical Social Worker °[phone] °

## 2014-01-14 NOTE — Plan of Care (Signed)
Pt is confused. Wanting to get OOB and states she needs to go home. Does not know where pts husband is at this time. Refused to get back to bed. States do not touch her. Getting combative. Charge nurse notified and asked to help.

## 2014-01-14 NOTE — Progress Notes (Signed)
[facility] PROGRESS NOTE  NAYAH LUKENS ZOX:[id number] DOB: 1940-12-08 DOA: 01/09/2014 PCP: Alva Garnet., MD  Assessment/Plan: #1. Acute CVA Patient with some clinical improvement. MRI/MRA of the head consistent with acute ischemia. 2-D echo no source of emboli with ejection fraction of 60-65%. Carotid Dopplers with no significant ICA stenosis. PT/OT. Continue Lipitor LDL of 141. Aspirin has been changed to Plavix per neurology recommendations. Risk factor modifications. Per CIR patient not a candidate. We'll discuss with Child psychotherapist for possible SNF placement. Follow.  #2 hypertension Stable. Continue Lasix, hydralazine, lisinopril, Lopressor.  #3 hypothyroidism Continue Synthroid.  #4 fall Secondary to problem #1. PT/OT.  #5 hyperlipidemia  Lipitor.  #6 type 2 diabetes Continue Lantus and sliding scale insulin.  #7 hypokalemia  repleted.  #8 chronic kidney disease Stable.  #9 fall Per nursing patient had a fall today after trying to throw a walker. That CT head which was done was negative. Monitor.  Code Status: Full Family Communication: updated patient and husband at bedside. Disposition Plan: SNF versus home when medically stable.   Consultants:  Neurology: Dr. Amada Jupiter 01/10/2014  Procedures:  CT head 01/14/2014  Chest x-ray 01/10/2014  MRI MRA of brain 01/09/2014   carotid Dopplers 01/11/2014    2-D echo 01/10/2014  Anti in carotidbiotics:  None  HPI/Subjective: Patient with no complaints.  Objective: Filed Vitals:   01/14/14 1115  BP: 199/82  Pulse: 81  Temp: 97.4 F (36.3 C)  Resp: 18    Intake/Output Summary (Last 24 hours) at 01/14/14 1132 Last data filed at 01/13/14 1300  Gross per 24 hour  Intake    240 ml  Output      0 ml  Net    240 ml   Filed Weights   01/10/14 0300  Weight: 87.98 kg (193 lb 15.4 oz)    Exam:   General:  NAD  Cardiovascular: RRR  Respiratory: CTAB  Abdomen:  Soft/NT/ND/+BS  Musculoskeletal: No c/c/e   Data Reviewed: Basic Metabolic Panel:  Recent Labs Lab 01/09/14 2016 01/09/14 2042 01/10/14 1100 01/11/14 0520 01/11/14 0740 01/14/14 0710  NA 142 140  --  133*  --  143  K 3.2* 3.1*  --  4.4  --  4.8  CL 97 97  --  91*  --  102  CO2 31  --   --  28  --  30  GLUCOSE 80 77  --  478*  --  79  BUN 24* 24*  --  25*  --  17  CREATININE 1.45* 1.60*  --  1.32*  --  1.40*  CALCIUM 9.5  --   --  9.2  --  9.1  MG  --   --  1.4*  --  1.8 1.9   Liver Function Tests:  Recent Labs Lab 01/09/14 2016  AST 13  ALT 6  ALKPHOS 88  BILITOT 0.2*  PROT 7.9  ALBUMIN 4.0   No results found for this basename: LIPASE, AMYLASE,  in the last 168 hours No results found for this basename: AMMONIA,  in the last 168 hours CBC:  Recent Labs Lab 01/09/14 2016 01/09/14 2042 01/11/14 0520 01/14/14 0710  WBC 6.4  --  6.6 4.0  NEUTROABS 3.8  --   --   --   HGB 13.6 14.6 12.6 11.7*  HCT 38.0 43.0 35.7* 34.1*  MCV 85.8  --  86.2 88.6  PLT 255  --  213 225   Cardiac Enzymes: No results found for this  basename: CKTOTAL, CKMB, CKMBINDEX, TROPONINI,  in the last 168 hours BNP (last 3 results) No results found for this basename: PROBNP,  in the last 8760 hours CBG:  Recent Labs Lab 01/13/14 1611 01/13/14 1646 01/13/14 2116 01/14/14 0646 01/14/14 0712  GLUCAP 66* 115* 159* 65* 77    No results found for this or any previous visit (from the past 240 hour(s)).   Studies: No results found.  Scheduled Meds: . atorvastatin  20 mg Oral q1800  . cyclobenzaprine  10 mg Oral Daily  . enoxaparin (LOVENOX) injection  30 mg Subcutaneous Daily  . furosemide  20 mg Oral BID  . gabapentin  300 mg Oral Daily  . hydrALAZINE  10 mg Oral QID  . insulin aspart  0-15 Units Subcutaneous TID WC  . insulin aspart  0-5 Units Subcutaneous QHS  . insulin glargine  5 Units Subcutaneous BID  . latanoprost  1 drop Both Eyes QHS  . levothyroxine  50 mcg Oral QAC  breakfast  . lisinopril  20 mg Oral Daily  . metoprolol tartrate  25 mg Oral BID  . olopatadine  1 drop Both Eyes BID  . pantoprazole  40 mg Oral Daily  . potassium chloride SA  20 mEq Oral BID   Continuous Infusions:   Principal Problem:   CVA (cerebral infarction) Active Problems:   Peripheral vascular disease, unspecified   Dizziness   Fall   Hypertension   Hyperlipidemia   Type II or unspecified type diabetes mellitus without mention of complication, not stated as uncontrolled   Hypokalemia    Time spent: 4935 MINS    [facility] Mechan[facility]burgHOMPSON,DANIEL MD [facility] Pager [phone]. If 7PM-7AM, please contact night-coverage at [email or web address], password [email or web address] 01/14/2014, 11:32 AM  LOS: 5 days

## 2014-01-14 NOTE — Progress Notes (Signed)
Hypoglycemic Event  CBG: 65  Treatment: 15 GM carbohydrate snack  Symptoms: Irritable  Follow-up CBG: Time:0712 CBG Result:77  Possible Reasons for Event: Medication regimen: lantus  Comments/MD notified:    Sharen Counterarvajal, Varina Hulon Rose Gonzales  Remember to initiate Hypoglycemia Order Set & complete

## 2014-01-15 LAB — BASIC METABOLIC PANEL
BUN: 14 mg/dL (ref 6–23)
CALCIUM: 9.2 mg/dL (ref 8.4–10.5)
CO2: 28 mEq/L (ref 19–32)
Chloride: 102 mEq/L (ref 96–112)
Creatinine, Ser: 1.2 mg/dL — ABNORMAL HIGH (ref 0.50–1.10)
GFR calc Af Amer: 50 mL/min — ABNORMAL LOW (ref >90)
GFR, EST NON AFRICAN AMERICAN: 43 mL/min — AB (ref >90)
GLUCOSE: 152 mg/dL — AB (ref 70–99)
Potassium: 5.2 mEq/L (ref 3.7–5.3)
SODIUM: 144 meq/L (ref 137–147)

## 2014-01-15 LAB — GLUCOSE, CAPILLARY
GLUCOSE-CAPILLARY: 86 mg/dL (ref 70–99)
Glucose-Capillary: 151 mg/dL — ABNORMAL HIGH (ref 70–99)
Glucose-Capillary: 129 mg/dL — ABNORMAL HIGH (ref 70–99)
Glucose-Capillary: 145 mg/dL — ABNORMAL HIGH (ref 70–99)

## 2014-01-15 LAB — CBC
HCT: 35.7 % — ABNORMAL LOW (ref 36.0–46.0)
Hemoglobin: 12.2 g/dL (ref 12.0–15.0)
MCH: 30.3 pg (ref 26.0–34.0)
MCHC: 34.2 g/dL (ref 30.0–36.0)
MCV: 88.6 fL (ref 78.0–100.0)
PLATELETS: 225 10*3/uL (ref 150–400)
RBC: 4.03 MIL/uL (ref 3.87–5.11)
RDW: 12.7 % (ref 11.5–15.5)
WBC: 3.6 10*3/uL — ABNORMAL LOW (ref 4.0–10.5)

## 2014-01-15 LAB — URINE CULTURE: Colony Count: >=100000

## 2014-01-15 NOTE — Progress Notes (Signed)
Physical Therapy Treatment Patient Details Name: [name] [name] MRN: [id number] DOB: 05-Jul-1941 Today's Date: 01/15/2014 Time: 4098-1191 PT Time Calculation (min): 19 min  PT Assessment / Plan / Recommendation  History of Present Illness [name] [name] is a 73 y.o. female with a history of a CVA in 2002, HTN, DM2, and PVD who presents to the Ed with complaints of sudden onset of feeling dizzy and sluggish and then almost falling instead she slid out of her chair to the floor.   She had increased weakness of her left side and her symptoms started at around 7:30pm.   She denied having any Headache or Chest pain.   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   PT Comments   Patient ambulated minimal distance with RW today. Still requires moderate assist. Patient with several boughts of dry cough during ambulation limiting ability to continue.  Follow Up Recommendations  CIR;Supervision/Assistance - 24 hour           Equipment Recommendations  3in1 (PT)    Recommendations for Other Services Rehab consult  Frequency Min 4X/week   Progress towards PT Goals Progress towards PT goals: Progressing toward goals  Plan Current plan remains appropriate    Precautions / Restrictions Precautions Precautions: Fall Restrictions Weight Bearing Restrictions: No   Pertinent Vitals/Pain Patient not complaining of pain at this time    Mobility  Bed Mobility Overal bed mobility: Needs Assistance;+2 for physical assistance Bed Mobility: Supine to Sit;Sit to Supine Supine to sit: Min assist Sit to supine: Min assist General bed mobility comments: Min A to position LUE. Transfers Overall transfer level: Needs assistance Equipment used: Rolling walker (2 wheeled) Transfers: Sit to/from Stand Sit to Stand: Min assist General transfer  comment: Assist for stability, VCs for hand placement manual hand placement on RW with assist to stabilize RW Ambulation/Gait Ambulation/Gait assistance: Mod assist Ambulation Distance (Feet): 60 Feet Assistive device: Rolling walker (2 wheeled) Gait Pattern/deviations: Shuffle;Decreased stride length;Drifts right/left;Trunk flexed Gait velocity: decreased Gait velocity interpretation: <1.8 ft/sec, indicative of risk for recurrent falls General Gait Details: assist required to navigate in room (patient legally blind but states that she can see some) Modified Rankin (Stroke Patients Only) Pre-Morbid Rankin Score: Moderate disability Modified Rankin: Severe disability     PT Goals (current goals can now be found in the care plan section) Acute Rehab PT Goals Patient Stated Goal: to get home PT Goal Formulation: With patient/family Time For Goal Achievement: 01/25/14 Potential to Achieve Goals: Fair  Visit Information  Last PT Received On: 01/15/14 Assistance Needed: +2 History of Present Illness: [name] [name] is a 73 y.o. female with a history of a CVA in 2002, HTN, DM2, and PVD who presents to the Ed with complaints of sudden onset of feeling dizzy and sluggish and then almost falling instead she slid out of her chair to the floor.   She had increased weakness of her left side and her symptoms started at around 7:30pm.   She denied having any Headache or Chest pain.   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    Subjective Data  Subjective: Patient more calm and willing to  participate this pm Patient Stated Goal: to get home   Cognition  Cognition Arousal/Alertness: Awake/alert Behavior During Therapy: Agitated;Anxious;Impulsive Overall Cognitive Status: Impaired/Different from baseline Area of Impairment:  Orientation;Attention;Following commands;Safety/judgement;Awareness;Problem solving Orientation Level: Disoriented to;Place;Time;Situation Current Attention Level: Sustained Following Commands: Follows one step commands with increased time;Follows multi-step commands inconsistently Safety/Judgement: Decreased awareness of safety;Decreased awareness of deficits Awareness: Intellectual Problem Solving: Requires verbal cues;Requires tactile cues General Comments: patient still disoriented and confused but willing to participate today.     Balance  Balance Sitting balance-Leahy Scale: Poor Sitting balance - Comments: Patient sat EOB x 6 minutes.  Patient leaning to left and posteriorly. Standing balance-Leahy Scale: Poor General Comments General comments (skin integrity, edema, etc.): patient with significant dry cough during ambulation, ambulated to hall, patient initiatlly reluctant to proceed but was able to continue ambulation with short focused goal " to make it to the door, to make it to the hall, to make it to the railing across the hall)  End of Session PT - End of Session Equipment Utilized During Treatment: Gait belt Activity Tolerance: Patient limited by lethargy;Patient limited by fatigue Patient left: in chair;with call bell/phone within reach;with chair alarm set;with family/visitor present Nurse Communication: Mobility status   GP     Fabio AsaWerner, Andrey Hoobler J 01/15/2014, 3:37 PM [place] Crumbevon Soffia Doshier, PT DPT  [phone]

## 2014-01-15 NOTE — Progress Notes (Signed)
Was the fall witnessed: no  Patient condition before and after the fall: confused before and after fall no physical injuries  Patient's reaction to the fall: calm-said she was trying to get back into bed from the chair  Name of the doctor that was notified including date and time: Ramiro HarvestDaniel Thompson MD 01/15/14 1500-MD on floor at the time  Any interventions and vital signs: back to bed with alarm and moved patient to camera room

## 2014-01-15 NOTE — Progress Notes (Signed)
Writer called to patient's room and found patient lying on the floor on her leftside besides sink. Denies any pain at the time and assessment shows no sign of apparent injury. Neuro check done, PERRLA. MD notified as well as husband. Patient was up in chair after working with PT and chair alarm not active at the time of fall. Patient reminded to call before getting up as she is not strong on her legs as she thought. Husband also notified to let staff know if he is leaving bedside. Will continue to monitor.

## 2014-01-15 NOTE — Progress Notes (Signed)
[facility] for [facility] disease management services. Patient has been engaged by a [employer] Community Care Coordinator and LCSW. Our community based plan of care has focused on disease management and medication procurement/adherence. On her last home visit encounter she indicated that she takes her medication when she chooses and has developed her own sliding scale for managing her diabetes. We are uncertain if we can progress this patient but are willing to make an additional attempt at discharge.  In to visit patient at bedside this afternoon and found her down on the floor side lying.  Alerted RN Staff Nurse for assistance.  Patient was alert, oriented x3, and complained of some left sided pain.  Did not attempt to move patient.  Placed a pillow under her head until unit staff began to provide assistance.  Patient will receive a post discharge transition of care call and will be evaluated for monthly home visits for assessments and disease process education. Made Inpatient Case Manager aware that [facility] following. Of note, [facility] services does not replace or interfere with any services that are arranged by inpatient case management or social work. For additional questions or referrals please contact Anibal Hendersonim Henderson BSN RN [facility] Hospital Liaison at [phone].

## 2014-01-15 NOTE — Progress Notes (Deleted)
[facility] is [facility] for chronic disease management services.  Patient has been engaged by a [employer] Community Care Coordinator and LCSW.  Our community based plan of care has focused on disease management and medication procurement/adherence.  On her last home visit encounter she indicated that she takes her medication when she chooses and has developed her own sliding scale for managing her diabetes.  We are uncertain if we can progress this patiePatient will receive a post discharge transition of care call and will be evaluated for monthly home visits for assessments and disease process education.  Made Inpatient Case Manager aware that [facility]. Of note, [facility] services does not replace or interfere with any services that are arranged by inpatient case management or social work.  For additional questions or referrals please contact Anibal Hendersonim Henderson BSN RN [facility] Hospital Liaison at [phone].

## 2014-01-15 NOTE — Progress Notes (Signed)
Physical Therapy Treatment Patient Details Name: Erin FullingBetty J Canavan MRN: [id number] DOB: December 27, 1940 Today's Date: 01/15/2014 Time: 1500  Called back to patient room s/p fall.  Patient found down on floor near end of bed.  Chair alarm was not alarming.  Assisted nsg to elevate patient from the floor (maximal assist +2) and ambulate back to bed (moderate assist +2 HHA), patient then assisted into bed (supine) with bed alarm set.  Patient does not report any pain or injury. No evidence or apparent injury at this time. Patient confused to events, stating initially that she was going to the bathroom, then states she was trying to go back to bed.  Spouse no longer in the room with patient.  Patient left in bed, bed alarm set, call bell within reach.  Charlotte Crumbevon Karmen Altamirano, PT DPT  [phone]

## 2014-01-15 NOTE — Progress Notes (Signed)
[facility] PROGRESS NOTE  [name] [name] GNF:[id number] DOB: 1941-04-30 DOA: 01/09/2014 PCP: Alva GarnetSHELTON,[name] R., MD  Assessment/Plan: #1. Acute CVA Patient with some clinical improvement. MRI/MRA of the head consistent with acute ischemia. 2-D echo no source of emboli with ejection fraction of 60-65%. Carotid Dopplers with no significant ICA stenosis. PT/OT. Continue Lipitor LDL of 141. Aspirin has been changed to Plavix per neurology recommendations. Risk factor modifications. Per CIR patient not a candidate. Needs SNF placement. Follow.  #2 hypertension Stable. Continue Lasix, hydralazine, lisinopril, Lopressor.  #3 hypothyroidism Continue Synthroid.  #4 fall Secondary to problem #1. PT/OT.  #5 hyperlipidemia  Lipitor.  #6 type 2 diabetes Continue Lantus and sliding scale insulin.  #7 hypokalemia  repleted.  #8 chronic kidney disease Stable.  #9 fall Per nursing patient had a fall yesterday and today . That CT head which was done was negative. Monitor. Likely needs placement.  Code Status: Full Family Communication: updated patient and husband at bedside. Disposition Plan: SNF when medically stable if patient is agreeable.   Consultants:  Neurology: Dr. Amada JupiterKirkpatrick 01/10/2014  Procedures:  CT head 01/14/2014  Chest x-ray 01/10/2014  MRI MRA of brain 01/09/2014   carotid Dopplers 01/11/2014    2-D echo 01/10/2014  Antibiotics:  None  HPI/Subjective: Patient with no complaints. Patient with fall yesterday after trying to throw walker at door. Patient with fall again today.  Objective: Filed Vitals:   01/15/14 0914  BP: 172/69  Pulse: 79  Temp: 97.9 F (36.6 C)  Resp: 18    Intake/Output Summary (Last 24 hours) at 01/15/14 1511 Last data filed at 01/15/14 0900  Gross per 24 hour  Intake    120 ml  Output      0 ml  Net    120 ml   Filed Weights   01/10/14 0300  Weight: 87.98 kg (193 lb 15.4 oz)    Exam:   General:   NAD  Cardiovascular: RRR  Respiratory: CTAB  Abdomen: Soft/NT/ND/+BS  Musculoskeletal: No c/c/e   Data Reviewed: Basic Metabolic Panel:  Recent Labs Lab 01/09/14 2016 01/09/14 2042 01/10/14 1100 01/11/14 0520 01/11/14 0740 01/14/14 0710 01/15/14 0625  NA 142 140  --  133*  --  143 144  K 3.2* 3.1*  --  4.4  --  4.8 5.2  CL 97 97  --  91*  --  102 102  CO2 31  --   --  28  --  30 28  GLUCOSE 80 77  --  478*  --  79 152*  BUN 24* 24*  --  25*  --  17 14  CREATININE 1.45* 1.60*  --  1.32*  --  1.40* 1.20*  CALCIUM 9.5  --   --  9.2  --  9.1 9.2  MG  --   --  1.4*  --  1.8 1.9  --    Liver Function Tests:  Recent Labs Lab 01/09/14 2016  AST 13  ALT 6  ALKPHOS 88  BILITOT 0.2*  PROT 7.9  ALBUMIN 4.0   No results found for this basename: LIPASE, AMYLASE,  in the last 168 hours No results found for this basename: AMMONIA,  in the last 168 hours CBC:  Recent Labs Lab 01/09/14 2016 01/09/14 2042 01/11/14 0520 01/14/14 0710 01/15/14 0625  WBC 6.4  --  6.6 4.0 3.6*  NEUTROABS 3.8  --   --   --   --   HGB 13.6 14.6 12.6 11.7* 12.2  HCT 38.0 43.0 35.7* 34.1* 35.7*  MCV 85.8  --  86.2 88.6 88.6  PLT 255  --  213 225 225   Cardiac Enzymes: No results found for this basename: CKTOTAL, CKMB, CKMBINDEX, TROPONINI,  in the last 168 hours BNP (last 3 results) No results found for this basename: PROBNP,  in the last 8760 hours CBG:  Recent Labs Lab 01/14/14 1141 01/14/14 1638 01/14/14 2207 01/15/14 0630 01/15/14 1127  GLUCAP 250* 220* 149* 151* 86    No results found for this or any previous visit (from the past 240 hour(s)).   Studies: Ct Head Wo Contrast  01/14/2014   CLINICAL DATA Fall.  EXAM CT HEAD WITHOUT CONTRAST  TECHNIQUE Contiguous axial images were obtained from the base of the skull through the vertex without intravenous contrast.  COMPARISON MRI 01/09/2014  FINDINGS Generalized atrophy. Moderate chronic microvascular ischemic change throughout  the white matter and basal ganglia. Chronic ischemia in the pons.  Negative for acute infarct. Negative for hemorrhage or mass lesion. Negative for skull fracture.  IMPRESSION Atrophy and chronic microvascular ischemia.  No acute abnormality.  SIGNATURE  Electronically Signed   By: Marlan Palau M.D.   On: 01/14/2014 15:49    Scheduled Meds: . atorvastatin  20 mg Oral q1800  . cyclobenzaprine  10 mg Oral Daily  . enoxaparin (LOVENOX) injection  30 mg Subcutaneous Daily  . furosemide  20 mg Oral BID  . gabapentin  300 mg Oral Daily  . hydrALAZINE  10 mg Oral QID  . insulin aspart  0-15 Units Subcutaneous TID WC  . insulin aspart  0-5 Units Subcutaneous QHS  . insulin glargine  5 Units Subcutaneous BID  . latanoprost  1 drop Both Eyes QHS  . levothyroxine  50 mcg Oral QAC breakfast  . lisinopril  20 mg Oral Daily  . metoprolol tartrate  25 mg Oral BID  . olopatadine  1 drop Both Eyes BID  . pantoprazole  40 mg Oral Daily  . potassium chloride SA  20 mEq Oral BID   Continuous Infusions:   Principal Problem:   CVA (cerebral infarction) Active Problems:   Peripheral vascular disease, unspecified   Dizziness   Fall   Hypertension   Hyperlipidemia   Type II or unspecified type diabetes mellitus without mention of complication, not stated as uncontrolled   Hypokalemia    Time spent: 6 MINS    [facility] MD [facility] Pager [phone]. If 7PM-7AM, please contact night-coverage at [email or web address], password [facility] 01/15/2014, 3:11 PM  LOS: 6 days

## 2014-01-16 LAB — BASIC METABOLIC PANEL
BUN: 15 mg/dL (ref 6–23)
CO2: 28 mEq/L (ref 19–32)
Calcium: 8.7 mg/dL (ref 8.4–10.5)
Chloride: 103 mEq/L (ref 96–112)
Creatinine, Ser: 1.24 mg/dL — ABNORMAL HIGH (ref 0.50–1.10)
GFR calc Af Amer: 48 mL/min — ABNORMAL LOW (ref >90)
GFR calc non Af Amer: 42 mL/min — ABNORMAL LOW (ref >90)
Glucose, Bld: 151 mg/dL — ABNORMAL HIGH (ref 70–99)
POTASSIUM: 4.7 meq/L (ref 3.7–5.3)
SODIUM: 142 meq/L (ref 137–147)

## 2014-01-16 LAB — GLUCOSE, CAPILLARY
GLUCOSE-CAPILLARY: 135 mg/dL — AB (ref 70–99)
Glucose-Capillary: 195 mg/dL — ABNORMAL HIGH (ref 70–99)

## 2014-01-16 MED ORDER — CLOPIDOGREL BISULFATE 75 MG PO TABS: 75.0000 mg | ORAL_TABLET | Freq: Every day | ORAL | Status: DC

## 2014-01-16 MED ORDER — ATORVASTATIN CALCIUM 20 MG PO TABS: 20.0000 mg | ORAL_TABLET | Freq: Every day | ORAL | Status: DC

## 2014-01-16 NOTE — Progress Notes (Signed)
[facility] on [address] [place], [place].  Confirmation placed in patients chart.  Reinforced with patient and husband at bedside to follow-up with pharmacy for prescription pick-up.  Patient and husband at bedside state that her husband will pick-up prescriptions when they are ready.  Still awaiting ambulance pick-up. Will continue to monitor.  [name] [name],[name] [name] 01/16/2014 4:45 PM

## 2014-01-16 NOTE — Progress Notes (Signed)
Patient discharged to home.  Patient alert, oriented, verbally responsive, breathing regular and non-labored throughout, no s/s of distress noted throughout, no c/o pain throughout.  Discharge instructions thoroughly verbalized to patient and husband at bedside.  Both verbalized understanding throughout.  Teach back of discharge instructions performed with patient.  Patient left unit per stretcher [facility] by [facility].    Reita ClicheWilliams,Kayen Grabel 01/16/2014 4:53 PM

## 2014-01-16 NOTE — Plan of Care (Signed)
Problem: Discharge/Transitional Outcomes Goal: Independent mobility/functioning independent or with min Independent mobility/functioning independently or with minimal assistance  Outcome: Not Met (add Reason) Patient refusing rehab wants to go home-patient very unsteady on feet

## 2014-01-16 NOTE — Discharge Summary (Signed)
Physician Discharge Summary  [name] [name] ZOX:[id number] DOB: Sep 23, 1941 DOA: 01/09/2014  PCP: Alva Garnet., MD  Admit date: 01/09/2014 Discharge date: 01/16/2014  Time spent: 70 minutes  Recommendations for Outpatient Follow-up:  1. Follow up with Alva Garnet., MD in 1 week. On follow up patient needs followup on a hyperlipidemia, as well as risk factor modification. 2. Followup with Dr. Pearlean Brownie of neurology in 2 months.  Discharge Diagnoses:  Principal Problem:   CVA (cerebral infarction) Active Problems:   Peripheral vascular disease, unspecified   Dizziness   Fall   Hypertension   Hyperlipidemia   Type II or unspecified type diabetes mellitus without mention of complication, not stated as uncontrolled   Hypokalemia   Discharge Condition: stable and improved.  Diet recommendation: Carb modified diet.  Filed Weights   01/10/14 0300  Weight: 87.98 kg (193 lb 15.4 oz)    History of present illness:  [name] [name] is a 73 y.o. female with a history of a CVA in 2002, HTN, DM2, and PVD who presents to the Ed with complaints of sudden onset of feeling dizzy and sluggish and then almost falling instead she slid out of her chair to the floor. She had increased weakness of her left side and her symptoms started at around 7:30pm. She denied having any Headache or Chest pain. When she arrived to the ED she was evaluated and seen by Neurology and had an MRI of the Brain which revealed an Ischemic Infarct of the Left Parietal white Matter, and remote bilateral cerebellar Infarcts. She was referred for medical admission.      Hospital Course:  #1. Acute CVA  Patient was admitted with sudden onset of dizziness and sluggishness and increased weakness on the left side. Patient was seen and evaluated by neurology in the ED MRI of the head which was done revealed an ischemic infarct of the left parietal white matter and remote bilateral cerebellar infarcts. Patient was deemed not a  TPA candidate secondary to delay in presentation. Patient was admitted for stroke workup.  2-D echo no source of emboli with ejection fraction of 60-65%. Carotid Dopplers with no significant ICA stenosis. PT/OT. Patient was started on Lipitor secondary to a LDL of 141. Patient was on aspirin which was subsequently changed to Plavix per neurology recommendations. Risk factor modifications. Patient was assessed by inpatient rehabilitation and deemed not a candidate. SNF was recommended however patient adamantly refused to be placed in a skilled nursing facility and a such will be discharged home.  #2 hypertension  Stable. Continued on Lasix, hydralazine, lisinopril, Lopressor.  #3 hypothyroidism  Continued on Synthroid.  #4 fall  Secondary to problem #1. PT/OT.  #5 hyperlipidemia  During the workup of patient's acute stroke fasting lipid panel was done and the LDL came back elevated at 811. Patient was started on Lipitor and will followup with PCP as outpatient.  #6 type 2 diabetes  Patient was maintained on Lantus and sliding scale insulin throughout the hospitalization.  #7 hypokalemia  repleted.  #8 chronic kidney disease  Stable.  #9 fall  Per nursing patient had a fall X 2. CT head which was done was negative. Patient needs placement in a skilled nursing facility however patient adamantly refuses to be placed and a such patient be discharged on with home health PT, OT, social worker.      Procedures: CT head 01/14/2014  Chest x-ray 01/10/2014  MRI MRA of brain 01/09/2014  carotid Dopplers 01/11/2014  2-D echo 01/10/2014  Consultations: Neurology: Dr. Amada Jupiter 01/10/2014   Discharge Exam: Filed Vitals:   01/16/14 1352  BP: 168/58  Pulse: 74  Temp: 97.9 F (36.6 C)  Resp: 18    General: NAD Cardiovascular: RRR Respiratory: CTAB  Discharge Instructions      Discharge Orders   Future Orders Complete By Expires   Diet Carb Modified  As directed    Discharge  instructions  As directed    Comments:     Follow up with Alva Garnet., MD in 1 week. Follow up Dr Pearlean Brownie in 2 months.   Increase activity slowly  As directed        Medication List    STOP taking these medications       aspirin EC 81 MG tablet     hydrochlorothiazide 25 MG tablet  Commonly known as:  HYDRODIURIL     metoprolol succinate 25 MG 24 hr tablet  Commonly known as:  TOPROL-XL      TAKE these medications       atorvastatin 20 MG tablet  Commonly known as:  LIPITOR  Take 1 tablet (20 mg total) by mouth daily at 6 PM.     clopidogrel 75 MG tablet  Commonly known as:  PLAVIX  Take 1 tablet (75 mg total) by mouth daily with breakfast.     cyclobenzaprine 10 MG tablet  Commonly known as:  FLEXERIL  Take 10 mg by mouth daily.     furosemide 20 MG tablet  Commonly known as:  LASIX  Take 20 mg by mouth 2 (two) times daily.     gabapentin 300 MG capsule  Commonly known as:  NEURONTIN  Take 300 mg by mouth daily.     glimepiride 4 MG tablet  Commonly known as:  AMARYL  Take 4 mg by mouth daily before breakfast.     hydrALAZINE 10 MG tablet  Commonly known as:  APRESOLINE  Take 1 tablet (10 mg total) by mouth 4 (four) times daily.     insulin NPH-regular Human (70-30) 100 UNIT/ML injection  Commonly known as:  NOVOLIN 70/30  Inject 40 Units into the skin daily as needed. Blood sugar >200 take insulin     levothyroxine 50 MCG tablet  Commonly known as:  SYNTHROID, LEVOTHROID  Take 50 mcg by mouth daily.     lisinopril 20 MG tablet  Commonly known as:  PRINIVIL,ZESTRIL  Take 20 mg by mouth daily.     LUMIGAN 0.01 % Soln  Generic drug:  bimatoprost  Place 1 drop into both eyes at bedtime.     metFORMIN 1000 MG tablet  Commonly known as:  GLUCOPHAGE  Take 1,000 mg by mouth 2 (two) times daily with a meal.     metoprolol tartrate 25 MG tablet  Commonly known as:  LOPRESSOR  Take 25 mg by mouth 2 (two) times daily.     naproxen 500 MG tablet   Commonly known as:  NAPROSYN  Take 1 tablet by mouth 2 (two) times daily.     omeprazole 40 MG capsule  Commonly known as:  PRILOSEC  Take 40 mg by mouth daily.     PATADAY 0.2 % Soln  Generic drug:  Olopatadine HCl  Place 1 drop into both eyes daily.     potassium chloride SA 20 MEQ tablet  Commonly known as:  K-DUR,KLOR-CON  Take 20 mEq by mouth 2 (two) times daily.       Allergies  Allergen Reactions  . Erythromycin   .  Iodine     REACTION: hives  . Penicillins     REACTION: hives   Follow-up Information   Follow up with Gates RiggSETHI,PRAMODKUMAR P, MD. Schedule an appointment as soon as possible for a visit in 2 months. (Stroke Clinic)    Specialties:  Neurology, Radiology   Contact information:   [address] [place] [address] [phone]       Follow up with Alva GarnetSHELTON,KIMBERLY R., MD. Schedule an appointment as soon as possible for a visit in 1 week.   Specialty:  Internal Medicine   Contact information:   [address] [place] [address] [phone]        The results of significant diagnostics from this hospitalization (including imaging, microbiology, ancillary and laboratory) are listed below for reference.    Significant Diagnostic Studies: Dg Chest 2 View  01/10/2014   CLINICAL DATA:  Cerebral infarction and weakness.  EXAM: CHEST - 2 VIEW  COMPARISON:  DG CHEST 2 VIEW dated 10/31/2013; MR HEAD W/O CM dated 01/09/2014; DG CHEST 2 VIEW dated 06/29/2013  FINDINGS: The heart size and mediastinal contours are within normal limits. There is no evidence of pulmonary edema, consolidation, pneumothorax, nodule or pleural fluid. The visualized skeletal structures show mild degenerative changes of the thoracic spine.  IMPRESSION: No active disease.   Electronically Signed   By: Irish LackGlenn  Yamagata M.D.   On: 01/10/2014 09:03   Ct Head Wo Contrast  01/14/2014   CLINICAL DATA Fall.  EXAM CT HEAD WITHOUT CONTRAST  TECHNIQUE Contiguous axial images  were obtained from the base of the skull through the vertex without intravenous contrast.  COMPARISON MRI 01/09/2014  FINDINGS Generalized atrophy. Moderate chronic microvascular ischemic change throughout the white matter and basal ganglia. Chronic ischemia in the pons.  Negative for acute infarct. Negative for hemorrhage or mass lesion. Negative for skull fracture.  IMPRESSION Atrophy and chronic microvascular ischemia.  No acute abnormality.  SIGNATURE  Electronically Signed   By: Marlan Palauharles  Clark M.D.   On: 01/14/2014 15:49   Mr Angiogram Head Wo Contrast  01/10/2014   CLINICAL DATA:  Dizziness. Evaluate for posterior circulation stroke.  EXAM: MRI HEAD WITHOUT CONTRAST  MRA HEAD WITHOUT CONTRAST  TECHNIQUE: Multiplanar, multiecho pulse sequences of the brain and surrounding structures were obtained without intravenous contrast. Angiographic images of the head were obtained using MRA technique without contrast.  COMPARISON:  None available.  FINDINGS: MRI HEAD FINDINGS  Diffuse prominence of the CSF containing spaces is compatible with mild age-related atrophy. Scattered and confluent T2/FLAIR hyperintensity within the periventricular and deep white matter both cerebral hemispheres is compatible with chronic small vessel ischemic changes. Similar changes are seen within the pons. Scattered remote cerebellar infarcts are noted bilaterally, most evident within the and left cerebellar hemisphere (series 10, image 17). Additional scattered remote lacunar infarcts seen within the periventricular and deep white matter both cerebral hemispheres bilaterally, most evident adjacent to the anterior horn of the left lateral ventricle (series 8, image 13). Remote lacunar infarcts within the bilateral basal ganglia also noted.  Single tiny focus of restricted diffusion measuring 5 mm is seen within the periatrial white matter of the left frontoparietal region (series 3, image 19). No other abnormal foci of restricted  diffusion are seen. No evidence of acute intracranial hemorrhage. Multiple scattered subcentimeter hypo intense foci seen on gradient echo sequence within the supratentorial and infratentorial brain likely represent small chronic micro hemorrhages, which may be related to hypertension.  No mass  lesion, midline shift, or extra-axial fluid collection. Ventricles are normal in size without evidence of hydrocephalus.  No diffusion-weighted signal abnormality is identified to suggest acute intracranial infarct. Gray-white matter differentiation is maintained. Normal flow voids are seen within the intracranial vasculature. No intracranial hemorrhage identified.  The cervicomedullary junction is normal. Pituitary gland is within normal limits. Pituitary stalk is midline. The globes and optic nerves demonstrate a normal appearance with normal signal intensity. The  The bone marrow signal intensity is normal. Calvarium is intact. Visualized upper cervical spine is within normal limits.  Scalp soft tissues are unremarkable.  Paranasal sinuses are clear.  No mastoid effusion.  MRA HEAD FINDINGS  Anterior circulation: Normal flow related enhancement of the included cervical internal carotid arteries. The petrous, cavernous, and supra clinoid segments are widely patent bilaterally with antegrade flow. Patent anterior communicating artery. Normal flow related enhancement of the anterior and middle cerebral arteries, including more distal segments. Multi focal irregularity seen within the M1 segments bilaterally, likely related to underlying atheromatous disease.  Posterior circulation: Right Vertebral artery is dominant. Short-segment stenosis of at least 70% is seen within the distal left vertebral artery just prior to the vertebrobasilar junction. Basilar artery is patent, with normal flow related enhancement of the main branch vessels. Normal flow related enhancement of the posterior cerebral arteries. Short segment stenosis  of approximately 50% seen within the right P2 segment.  No large vessel occlusion, hemodynamically significant stenosis, abnormal luminal irregularity, aneurysm within the anterior nor posterior circulation.  IMPRESSION: MRI HEAD:  1. 5 mm acute ischemic infarct within the left periatrial white matter as above. 2. Remote infarcts involving the bilateral cerebellar hemispheres, with additional scattered remote lacunar infarcts involving the bilateral basal ganglia and supratentorial white matter. 3. Moderate atrophy and chronic microvascular ischemic disease within the supratentorial white matter and pons.  MRA HEAD:  1. 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 The right vertebral artery is dominant. 2. Short-segment stenosis of approximately 50% within the right P2 segment. 3. No other high-grade flow-limiting stenosis or proximal branch occlusion identified. 4. No intracranial aneurysm.   Electronically Signed   By: Rise Mu M.D.   On: 01/10/2014 00:01   Mr Brain Wo Contrast  01/10/2014   CLINICAL DATA:  Dizziness. Evaluate for posterior circulation stroke.  EXAM: MRI HEAD WITHOUT CONTRAST  MRA HEAD WITHOUT CONTRAST  TECHNIQUE: Multiplanar, multiecho pulse sequences of the brain and surrounding structures were obtained without intravenous contrast. Angiographic images of the head were obtained using MRA technique without contrast.  COMPARISON:  None available.  FINDINGS: MRI HEAD FINDINGS  Diffuse prominence of the CSF containing spaces is compatible with mild age-related atrophy. Scattered and confluent T2/FLAIR hyperintensity within the periventricular and deep white matter both cerebral hemispheres is compatible with chronic small vessel ischemic changes. Similar changes are seen within the pons. Scattered remote cerebellar infarcts are noted bilaterally, most evident within the and left cerebellar hemisphere (series  10, image 17). Additional scattered remote lacunar infarcts seen within the periventricular and deep white matter both cerebral hemispheres bilaterally, most evident adjacent to the anterior horn of the left lateral ventricle (series 8, image 13). Remote lacunar infarcts within the bilateral basal ganglia also noted.  Single tiny focus of restricted diffusion measuring 5 mm is seen within the periatrial white matter of the left frontoparietal region (series 3, image 19). No other abnormal foci of restricted diffusion are seen. No evidence  of acute intracranial hemorrhage. Multiple scattered subcentimeter hypo intense foci seen on gradient echo sequence within the supratentorial and infratentorial brain likely represent small chronic micro hemorrhages, which may be related to hypertension.  No mass lesion, midline shift, or extra-axial fluid collection. Ventricles are normal in size without evidence of hydrocephalus.  No diffusion-weighted signal abnormality is identified to suggest acute intracranial infarct. Gray-white matter differentiation is maintained. Normal flow voids are seen within the intracranial vasculature. No intracranial hemorrhage identified.  The cervicomedullary junction is normal. Pituitary gland is within normal limits. Pituitary stalk is midline. The globes and optic nerves demonstrate a normal appearance with normal signal intensity. The  The bone marrow signal intensity is normal. Calvarium is intact. Visualized upper cervical spine is within normal limits.  Scalp soft tissues are unremarkable.  Paranasal sinuses are clear.  No mastoid effusion.  MRA HEAD FINDINGS  Anterior circulation: Normal flow related enhancement of the included cervical internal carotid arteries. The petrous, cavernous, and supra clinoid segments are widely patent bilaterally with antegrade flow. Patent anterior communicating artery. Normal flow related enhancement of the anterior and middle cerebral arteries, including  more distal segments. Multi focal irregularity seen within the M1 segments bilaterally, likely related to underlying atheromatous disease.  Posterior circulation: Right Vertebral artery is dominant. Short-segment stenosis of at least 70% is seen within the distal left vertebral artery just prior to the vertebrobasilar junction. Basilar artery is patent, with normal flow related enhancement of the main branch vessels. Normal flow related enhancement of the posterior cerebral arteries. Short segment stenosis of approximately 50% seen within the right P2 segment.  No large vessel occlusion, hemodynamically significant stenosis, abnormal luminal irregularity, aneurysm within the anterior nor posterior circulation.  IMPRESSION: MRI HEAD:  1. 5 mm acute ischemic infarct within the left periatrial white matter as above. 2. Remote infarcts involving the bilateral cerebellar hemispheres, with additional scattered remote lacunar infarcts involving the bilateral basal ganglia and supratentorial white matter. 3. Moderate atrophy and chronic microvascular ischemic disease within the supratentorial white matter and pons.  MRA HEAD:  1. 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 The right vertebral artery is dominant. 2. Short-segment stenosis of approximately 50% within the right P2 segment. 3. No other high-grade flow-limiting stenosis or proximal branch occlusion identified. 4. No intracranial aneurysm.   Electronically Signed   By: Rise Mu M.D.   On: 01/10/2014 00:01    Microbiology: Recent Results (from the past 240 hour(s))  URINE CULTURE     Status: None   Collection Time    01/14/14  9:35 PM      Result Value Ref Range Status   Specimen Description URINE, CLEAN CATCH   Final   Special Requests NONE   Final   Culture  Setup Time     Final   Value: 01/14/2014 22:18     Performed at [facility] Count      Final   Value: >=100,000 COLONIES/ML     Performed at [facility]   Culture     Final   Value: Multiple bacterial morphotypes present, none predominant. Suggest appropriate recollection if clinically indicated.     Performed at [facility]   Report Status 01/15/2014 FINAL   Final     Labs: Basic Metabolic Panel:  Recent Labs Lab 01/09/14 2016 01/09/14 2042 01/10/14 1100 01/11/14 0520 01/11/14 0740 01/14/14 0710 01/15/14 6962 01/16/14 9528  NA 142 140  --  133*  --  143 144 142  K 3.2* 3.1*  --  4.4  --  4.8 5.2 4.7  CL 97 97  --  91*  --  102 102 103  CO2 31  --   --  28  --  30 28 28   GLUCOSE 80 77  --  478*  --  79 152* 151*  BUN 24* 24*  --  25*  --  17 14 15   CREATININE 1.45* 1.60*  --  1.32*  --  1.40* 1.20* 1.24*  CALCIUM 9.5  --   --  9.2  --  9.1 9.2 8.7  MG  --   --  1.4*  --  1.8 1.9  --   --    Liver Function Tests:  Recent Labs Lab 01/09/14 2016  AST 13  ALT 6  ALKPHOS 88  BILITOT 0.2*  PROT 7.9  ALBUMIN 4.0   No results found for this basename: LIPASE, AMYLASE,  in the last 168 hours No results found for this basename: AMMONIA,  in the last 168 hours CBC:  Recent Labs Lab 01/09/14 2016 01/09/14 2042 01/11/14 0520 01/14/14 0710 01/15/14 0625  WBC 6.4  --  6.6 4.0 3.6*  NEUTROABS 3.8  --   --   --   --   HGB 13.6 14.6 12.6 11.7* 12.2  HCT 38.0 43.0 35.7* 34.1* 35.7*  MCV 85.8  --  86.2 88.6 88.6  PLT 255  --  213 225 225   Cardiac Enzymes: No results found for this basename: CKTOTAL, CKMB, CKMBINDEX, TROPONINI,  in the last 168 hours BNP: BNP (last 3 results) No results found for this basename: PROBNP,  in the last 8760 hours CBG:  Recent Labs Lab 01/15/14 1127 01/15/14 1700 01/15/14 2132 01/16/14 0701 01/16/14 1112  GLUCAP 86 129* 145* 135* 195*       Signed:  THOMPSON,DANIEL MD [facility] 01/16/2014, 2:03 PM

## 2014-01-16 NOTE — Progress Notes (Signed)
Patient has no ride home.  Writer called Gina with [facility] who will arAlmira [place]asterrange [place]  Lance BoschAnna Maretta Overdorf, RN

## 2014-01-16 NOTE — Plan of Care (Signed)
Problem: Discharge/Transitional Outcomes Goal: PCP appointment made and transportation plan in place Outcome: Not Met (add Reason) Patient will make appointment once home

## 2014-01-16 NOTE — Progress Notes (Signed)
Occupational Therapy Treatment Patient Details Name: [name] [name] MRN: [id number] DOB: 05-Feb-1941 Today's Date: 01/16/2014 Time: 4098-1191 OT Time Calculation (min): 21 min  OT Assessment / Plan / Recommendation  History of present illness [name] [name] is a 73 y.o. female with a history of a CVA in 2002, HTN, DM2, and PVD who presents to the Ed with complaints of sudden onset of feeling dizzy and sluggish and then almost falling instead she slid out of her chair to the floor.   She had increased weakness of her left side and her symptoms started at around 7:30pm.   She denied having any Headache or Chest pain.   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   OT comments  Note fall from yesterday, pt. Reports no pain or discomfort today but resistant to oob or other functional tasks.  Max encouragement for participation.  Pt. Max a for lb dressing and max/total a for bed mobility. difficulty following inst. Cues. agitation through out session.  Slow progress from previous doc. And from today with notable safety concerns secondary to recent fall.  Follow Up Recommendations  Supervision/Assistance - 24 hour;CIR                  Recommendations for Other Services Rehab consult  Frequency Min 2X/week   Progress towards OT Goals Progress towards OT goals: Progressing toward goals  Plan Discharge plan remains appropriate    Precautions / Restrictions Precautions Precautions: Fall Precaution Comments: doc. reveals that pt. had a fall yesterday p.m.   Pertinent Vitals/Pain Pt. Denies any pain or discomfort    ADL  Eating/Feeding: Performed;Minimal assistance Where Assessed - Eating/Feeding: Bed level Lower Body Dressing: Maximal assistance Where Assessed - Lower Body Dressing: Supine, head of bed up Transfers/Ambulation  Related to ADLs: pt. declined oob stating she was too cold ADL Comments: reviewed and demonstrated lb dressing tech. spouse aware but did not engage in session or communication when initiated with him.  pt. would verbalize understanding of inst. for task but also verbalize reasons she "just could not do it right now".  declined several options for self care and also toileting      OT Goals(current goals can now be found in the care plan section)    Visit Information  Last OT Received On: 01/16/14 History of Present Illness: [name] [name] is a 73 y.o. female with a history of a CVA in 2002, HTN, DM2, and PVD who presents to the Ed with complaints of sudden onset of feeling dizzy and sluggish and then almost falling instead she slid out of her chair to the floor.   She had increased weakness of her left side and her symptoms started at around 7:30pm.   She denied having any Headache or Chest pain.   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    Subjective Data   "i wish you knew how to use the bed to help me up" (in response to inst. Cues for pt. To assist with bed mobility)          Cognition  Cognition Arousal/Alertness: Awake/alert Behavior During Therapy: Agitated Overall Cognitive Status: Impaired/Different from baseline  Area of Impairment: Orientation;Attention;Following commands;Safety/judgement;Awareness;Problem solving Current Attention Level: Sustained Following Commands: Follows one step commands with increased time;Follows multi-step commands inconsistently Safety/Judgement: Decreased awareness of safety;Decreased awareness of deficits Awareness: Intellectual Problem Solving: Requires verbal cues;Requires tactile cues General Comments: agitation and con't. disagreement and refusal of several tasks and tech. during  session.  husband would not answer questions or engage in any verbal communication with therapist asst.    Mobility  Bed Mobility Overal bed mobility: Needs Assistance General bed mobility comments: pt. refused oob but agreeable to bed mobility in prep for safet in/out transfers.  max inst. cues for hand placement on bed rails to aide in scooting and pulling up.  max a to b les to stabalize feet to aide in pushing up in bed. pt. con't. to repeat "dont hold my feet stop i just wish you knew how to work the bed and help me" reviewed multiple times i was helping her by showing her tech. to teach her how to perform tasks without relying on bed controls in prep for home.  con't. to argue with me about using the bed controls to help her vs. her attempt              End of Session OT - End of Session Activity Tolerance: Treatment limited secondary to agitation;Other (comment) (overall refusal of skilled tasks) Patient left: in bed;with bed alarm set;with family/visitor present       Robet LeuMorris, Natalynn Pedone Lorraine, COTA/L 01/16/2014, 8:02 AM

## 2014-01-16 NOTE — Progress Notes (Signed)
Discharge instructions thoroughly verbalized to patient and husband at bedside.  Patient and husband verbalized understanding throughout.  Questions answered throughout.  Discharge paperwork signed, signed copy placed in patient chart.  Copy of discharge paperwork administered to patient.   Ambulance to be called for patient pick-up and transport.  Awaiting ambulance pick-up.  Reita ClicheWilliams,Jeffie Spivack 01/16/2014 3:24 PM

## 2014-01-19 ENCOUNTER — Telehealth: Payer: Self-pay | Admitting: Neurology

## 2014-01-19 NOTE — Telephone Encounter (Signed)
Pt called is wanting to know why she hasn't recd her walker and bedside/bathroom commode? Please call pt concerning this matter, pt states she saw Dr. Pearlean BrownieSethi and that he was going to order this for her and have it at her house this morning.

## 2014-01-19 NOTE — Telephone Encounter (Signed)
Called patient to inform her that her order for the walker and bedside commode was sent to [facility] and that she need to contact them concerning this matter. I advised the patient that if she has any other problems, questions or concerns to call the office. Patient verbalized understanding.

## 2014-03-20 ENCOUNTER — Emergency Department (HOSPITAL_COMMUNITY): Payer: Medicare HMO

## 2014-03-20 ENCOUNTER — Inpatient Hospital Stay (HOSPITAL_COMMUNITY)
Admission: EM | Admit: 2014-03-20 | Discharge: 2014-03-24 | DRG: 041 | Disposition: A | Discharge status: Home / Self Care | Payer: Medicare HMO | Attending: Internal Medicine | Admitting: Internal Medicine

## 2014-03-20 ENCOUNTER — Encounter (HOSPITAL_COMMUNITY): Payer: Self-pay | Admitting: Emergency Medicine

## 2014-03-20 DIAGNOSIS — E039 Hypothyroidism, unspecified: Secondary | ICD-10-CM | POA: Diagnosis present

## 2014-03-20 DIAGNOSIS — Z7902 Long term (current) use of antithrombotics/antiplatelets: Secondary | ICD-10-CM

## 2014-03-20 DIAGNOSIS — Z8249 Family history of ischemic heart disease and other diseases of the circulatory system: Secondary | ICD-10-CM

## 2014-03-20 DIAGNOSIS — IMO0002 Reserved for concepts with insufficient information to code with codable children: Secondary | ICD-10-CM | POA: Diagnosis present

## 2014-03-20 DIAGNOSIS — E876 Hypokalemia: Secondary | ICD-10-CM

## 2014-03-20 DIAGNOSIS — E162 Hypoglycemia, unspecified: Secondary | ICD-10-CM

## 2014-03-20 DIAGNOSIS — I739 Peripheral vascular disease, unspecified: Secondary | ICD-10-CM

## 2014-03-20 DIAGNOSIS — Q2111 Secundum atrial septal defect: Secondary | ICD-10-CM

## 2014-03-20 DIAGNOSIS — R29898 Other symptoms and signs involving the musculoskeletal system: Secondary | ICD-10-CM | POA: Diagnosis present

## 2014-03-20 DIAGNOSIS — I639 Cerebral infarction, unspecified: Secondary | ICD-10-CM

## 2014-03-20 DIAGNOSIS — F17200 Nicotine dependence, unspecified, uncomplicated: Secondary | ICD-10-CM | POA: Diagnosis present

## 2014-03-20 DIAGNOSIS — M949 Disorder of cartilage, unspecified: Secondary | ICD-10-CM

## 2014-03-20 DIAGNOSIS — M899 Disorder of bone, unspecified: Secondary | ICD-10-CM | POA: Diagnosis present

## 2014-03-20 DIAGNOSIS — E1165 Type 2 diabetes mellitus with hyperglycemia: Secondary | ICD-10-CM | POA: Diagnosis present

## 2014-03-20 DIAGNOSIS — N183 Chronic kidney disease, stage 3 unspecified: Secondary | ICD-10-CM | POA: Diagnosis present

## 2014-03-20 DIAGNOSIS — I6350 Cerebral infarction due to unspecified occlusion or stenosis of unspecified cerebral artery: Secondary | ICD-10-CM

## 2014-03-20 DIAGNOSIS — I129 Hypertensive chronic kidney disease with stage 1 through stage 4 chronic kidney disease, or unspecified chronic kidney disease: Secondary | ICD-10-CM | POA: Diagnosis present

## 2014-03-20 DIAGNOSIS — Q211 Atrial septal defect: Secondary | ICD-10-CM

## 2014-03-20 DIAGNOSIS — Z794 Long term (current) use of insulin: Secondary | ICD-10-CM

## 2014-03-20 DIAGNOSIS — E1142 Type 2 diabetes mellitus with diabetic polyneuropathy: Secondary | ICD-10-CM | POA: Diagnosis present

## 2014-03-20 DIAGNOSIS — W19XXXA Unspecified fall, initial encounter: Secondary | ICD-10-CM

## 2014-03-20 DIAGNOSIS — Z8673 Personal history of transient ischemic attack (TIA), and cerebral infarction without residual deficits: Secondary | ICD-10-CM | POA: Diagnosis present

## 2014-03-20 DIAGNOSIS — R471 Dysarthria and anarthria: Secondary | ICD-10-CM | POA: Diagnosis present

## 2014-03-20 DIAGNOSIS — I69998 Other sequelae following unspecified cerebrovascular disease: Secondary | ICD-10-CM

## 2014-03-20 DIAGNOSIS — I10 Essential (primary) hypertension: Secondary | ICD-10-CM

## 2014-03-20 DIAGNOSIS — M79609 Pain in unspecified limb: Secondary | ICD-10-CM

## 2014-03-20 DIAGNOSIS — R42 Dizziness and giddiness: Secondary | ICD-10-CM

## 2014-03-20 DIAGNOSIS — N058 Unspecified nephritic syndrome with other morphologic changes: Secondary | ICD-10-CM | POA: Diagnosis present

## 2014-03-20 DIAGNOSIS — E1149 Type 2 diabetes mellitus with other diabetic neurological complication: Secondary | ICD-10-CM | POA: Diagnosis present

## 2014-03-20 DIAGNOSIS — E785 Hyperlipidemia, unspecified: Secondary | ICD-10-CM | POA: Diagnosis present

## 2014-03-20 DIAGNOSIS — E1129 Type 2 diabetes mellitus with other diabetic kidney complication: Secondary | ICD-10-CM | POA: Diagnosis present

## 2014-03-20 DIAGNOSIS — H548 Legal blindness, as defined in USA: Secondary | ICD-10-CM | POA: Diagnosis present

## 2014-03-20 DIAGNOSIS — Z833 Family history of diabetes mellitus: Secondary | ICD-10-CM

## 2014-03-20 DIAGNOSIS — G8190 Hemiplegia, unspecified affecting unspecified side: Secondary | ICD-10-CM | POA: Diagnosis present

## 2014-03-20 LAB — DIFFERENTIAL
BASOS PCT: 0 % (ref 0–1)
Basophils Absolute: 0 10*3/uL (ref 0.0–0.1)
Eosinophils Absolute: 0.1 10*3/uL (ref 0.0–0.7)
Eosinophils Relative: 1 % (ref 0–5)
LYMPHS ABS: 2.5 10*3/uL (ref 0.7–4.0)
Lymphocytes Relative: 38 % (ref 12–46)
MONOS PCT: 11 % (ref 3–12)
Monocytes Absolute: 0.7 10*3/uL (ref 0.1–1.0)
NEUTROS PCT: 50 % (ref 43–77)
Neutro Abs: 3.2 10*3/uL (ref 1.7–7.7)

## 2014-03-20 LAB — COMPREHENSIVE METABOLIC PANEL
ALBUMIN: 3.6 g/dL (ref 3.5–5.2)
ALK PHOS: 62 U/L (ref 39–117)
ALT: 7 U/L (ref 0–35)
AST: 15 U/L (ref 0–37)
BUN: 25 mg/dL — AB (ref 6–23)
CHLORIDE: 99 meq/L (ref 96–112)
CO2: 30 mEq/L (ref 19–32)
Calcium: 9.5 mg/dL (ref 8.4–10.5)
Creatinine, Ser: 1.48 mg/dL — ABNORMAL HIGH (ref 0.50–1.10)
GFR calc Af Amer: 39 mL/min — ABNORMAL LOW (ref >90)
GFR calc non Af Amer: 34 mL/min — ABNORMAL LOW (ref >90)
Glucose, Bld: 42 mg/dL — CL (ref 70–99)
POTASSIUM: 3.8 meq/L (ref 3.7–5.3)
Sodium: 141 mEq/L (ref 137–147)
Total Protein: 7.4 g/dL (ref 6.0–8.3)

## 2014-03-20 LAB — PROTIME-INR
INR: 1.03 (ref 0.00–1.49)
Prothrombin Time: 13.3 seconds (ref 11.6–15.2)

## 2014-03-20 LAB — CBC
HCT: 34.3 % — ABNORMAL LOW (ref 36.0–46.0)
HEMOGLOBIN: 11.9 g/dL — AB (ref 12.0–15.0)
MCH: 30.6 pg (ref 26.0–34.0)
MCHC: 34.7 g/dL (ref 30.0–36.0)
MCV: 88.2 fL (ref 78.0–100.0)
Platelets: 256 10*3/uL (ref 150–400)
RBC: 3.89 MIL/uL (ref 3.87–5.11)
RDW: 12.4 % (ref 11.5–15.5)
WBC: 6.4 10*3/uL (ref 4.0–10.5)

## 2014-03-20 LAB — URINE MICROSCOPIC-ADD ON

## 2014-03-20 LAB — URINALYSIS, ROUTINE W REFLEX MICROSCOPIC
BILIRUBIN URINE: NEGATIVE
GLUCOSE, UA: 250 mg/dL — AB
HGB URINE DIPSTICK: NEGATIVE
Ketones, ur: NEGATIVE mg/dL
Leukocytes, UA: NEGATIVE
Nitrite: NEGATIVE
PH: 6.5 (ref 5.0–8.0)
Protein, ur: 30 mg/dL — AB
SPECIFIC GRAVITY, URINE: 1.009 (ref 1.005–1.030)
Urobilinogen, UA: 0.2 mg/dL (ref 0.0–1.0)

## 2014-03-20 LAB — I-STAT TROPONIN, ED: Troponin i, poc: 0.02 ng/mL (ref 0.00–0.08)

## 2014-03-20 LAB — CBG MONITORING, ED
GLUCOSE-CAPILLARY: 126 mg/dL — AB (ref 70–99)
Glucose-Capillary: 148 mg/dL — ABNORMAL HIGH (ref 70–99)
Glucose-Capillary: 50 mg/dL — ABNORMAL LOW (ref 70–99)

## 2014-03-20 LAB — APTT: aPTT: 30 seconds (ref 24–37)

## 2014-03-20 MED ORDER — DEXTROSE 50 % IV SOLN
1.0000 | Freq: Once | INTRAVENOUS | Status: AC
  Administered 2014-03-20 (×2): 50 mL via INTRAVENOUS

## 2014-03-20 MED ORDER — DEXTROSE 50 % IV SOLN: 1.0000 | Freq: Once | INTRAVENOUS | Status: DC

## 2014-03-20 MED ORDER — DEXTROSE 50 % IV SOLN
INTRAVENOUS | Status: AC
  Administered 2014-03-20: 50 mL via INTRAVENOUS
  Filled 2014-03-20: qty 50

## 2014-03-20 NOTE — ED Provider Notes (Signed)
CSN: [id number]     Arrival date & time 03/20/14  2151 History   First MD Initiated Contact with Patient 03/20/14 2215     Chief Complaint  Patient presents with  . Code Stroke    @EDPCLEARED @ (Consider location/radiation/quality/duration/timing/severity/associated sxs/prior Treatment) HPI  [name] [name] is a 73 y.o. female reports to me, that she thought she was having a stroke, because while eating with her right hand, she could not eat the food, because she could not puncture the food with her fork. This happened just prior to coming here, by EMS. She also reports a numb sensation on the left side of her body. She is a somewhat vague historian. He denies other recent problems. There are no other known modifying factors.    Past Medical History  Diagnosis Date  . Hypertension   . Diabetes mellitus   . Stroke   . Chronic renal insufficiency   . Hyperlipidemia   . Osteopenia   . CVA (cerebral infarction)    Past Surgical History  Procedure Laterality Date  . Eye surgery     Family History  Problem Relation Age of Onset  . Diabetes Mother   . Hypertension Mother    History  Substance Use Topics  . Smoking status: Current Every Day Smoker -- 0.50 packs/day for 60 years    Types: Cigarettes  . Smokeless tobacco: Not on file     Comment: pt states "sometimes I quit smoking for about 2 weeks then I start back"  . Alcohol Use: Yes     Comment: occasionally   OB History   Grav Para Term Preterm Abortions TAB SAB Ect Mult Living                 Review of Systems  All other systems reviewed and are negative.     Allergies  Erythromycin; Iodine; and Penicillins  Home Medications   Prior to Admission medications   Medication Sig Start Date End Date Taking? Authorizing Provider  atorvastatin (LIPITOR) 20 MG tablet Take 1 tablet (20 mg total) by mouth daily at 6 PM. 01/16/14   Rodolph Bong, MD  clopidogrel (PLAVIX) 75 MG tablet Take 1 tablet (75 mg total) by  mouth daily with breakfast. 01/16/14   Rodolph Bong, MD  cyclobenzaprine (FLEXERIL) 10 MG tablet Take 10 mg by mouth daily.    Historical Provider, MD  furosemide (LASIX) 20 MG tablet Take 20 mg by mouth 2 (two) times daily.    Historical Provider, MD  gabapentin (NEURONTIN) 300 MG capsule Take 300 mg by mouth daily.    Historical Provider, MD  glimepiride (AMARYL) 4 MG tablet Take 4 mg by mouth daily before breakfast.    Historical Provider, MD  hydrALAZINE (APRESOLINE) 10 MG tablet Take 1 tablet (10 mg total) by mouth 4 (four) times daily. 10/31/13   Flint Melter, MD  insulin NPH-regular (NOVOLIN 70/30) (70-30) 100 UNIT/ML injection Inject 40 Units into the skin daily as needed. Blood sugar >200 take insulin    Historical Provider, MD  levothyroxine (SYNTHROID, LEVOTHROID) 50 MCG tablet Take 50 mcg by mouth daily.    Historical Provider, MD  lisinopril (PRINIVIL,ZESTRIL) 20 MG tablet Take 20 mg by mouth daily.    Historical Provider, MD  LUMIGAN 0.01 % SOLN Place 1 drop into both eyes at bedtime.  11/01/13   Historical Provider, MD  metFORMIN (GLUCOPHAGE) 1000 MG tablet Take 1,000 mg by mouth 2 (two) times daily with a meal.  Historical Provider, MD  metoprolol tartrate (LOPRESSOR) 25 MG tablet Take 25 mg by mouth 2 (two) times daily.    Historical Provider, MD  naproxen (NAPROSYN) 500 MG tablet Take 1 tablet by mouth 2 (two) times daily. 03/01/13   Historical Provider, MD  omeprazole (PRILOSEC) 40 MG capsule Take 40 mg by mouth daily.    Historical Provider, MD  PATADAY 0.2 % SOLN Place 1 drop into both eyes daily.  11/29/13   Historical Provider, MD  potassium chloride SA (K-DUR,KLOR-CON) 20 MEQ tablet Take 20 mEq by mouth 2 (two) times daily.    Historical Provider, MD   BP 182/60  Pulse 75  Temp(Src) 98.5 F (36.9 C) (Oral)  Resp 22  SpO2 99% Physical Exam  Nursing note and vitals reviewed. Constitutional: She appears well-developed.  Elderly, frail  HENT:  Head:  Normocephalic and atraumatic.  Eyes: Conjunctivae and EOM are normal. Pupils are equal, round, and reactive to light.  Neck: Normal range of motion and phonation normal. Neck supple.  Cardiovascular: Normal rate, regular rhythm and intact distal pulses.   Pulmonary/Chest: Effort normal and breath sounds normal. She exhibits no tenderness.  Abdominal: Soft. She exhibits no distension. There is no tenderness. There is no guarding.  Musculoskeletal: Normal range of motion. She exhibits no edema.  Neurological: She is alert. She exhibits normal muscle tone.  No dysarthria or aphasia. Finger to nose testing on the right is abnormal on the left is normal. She has weakness of the right upper (4/5)  and lower (3/5) extremities.   Skin: Skin is warm and dry.  Psychiatric: She has a normal mood and affect. Her behavior is normal.    ED Course  Procedures (including critical care time)  Discussed the case with the neural hospitalist shortly after he saw the patient. I related my physical findings, to him  Medications  dextrose 50 % solution 50 mL (50 mLs Intravenous Given 03/20/14 2319)    Patient Vitals for the past 24 hrs:  BP Temp Temp src Pulse Resp SpO2  03/20/14 2345 182/60 mmHg - - 75 22 99 %  03/20/14 2330 177/55 mmHg - - 78 19 100 %  03/20/14 2315 179/64 mmHg - - 75 22 100 %  03/20/14 2300 171/60 mmHg - - 77 20 99 %  03/20/14 2245 168/54 mmHg - - 74 22 100 %  03/20/14 2230 153/82 mmHg - - 75 18 100 %  03/20/14 2204 - - - - - 98 %  03/20/14 2159 152/51 mmHg 98.5 F (36.9 C) Oral 63 16 100 %    12:20 AM Reevaluation with update and discussion. After initial assessment and treatment, an updated evaluation reveals recurrent hypoglycemia required a second dose of D50. No change in neurologic status. Flint Melter      12:20 AM-Consult complete with Dr Betti Cruz. Patient case explained and discussed. He agrees to admit patient for further evaluation and treatment. Call ended at  0025   CRITICAL CARE Performed by: Flint Melter Total critical care time: 40 minutes Critical care time was exclusive of separately billable procedures and treating other patients. Critical care was necessary to treat or prevent imminent or life-threatening deterioration. Critical care was time spent personally by me on the following activities: development of treatment plan with patient and/or surrogate as well as nursing, discussions with consultants, evaluation of patient's response to treatment, examination of patient, obtaining history from patient or surrogate, ordering and performing treatments and interventions, ordering and review of laboratory  studies, ordering and review of radiographic studies, pulse oximetry and re-evaluation of patient's condition.  Labs Review Labs Reviewed  CBC - Abnormal; Notable for the following:    Hemoglobin 11.9 (*)    HCT 34.3 (*)    All other components within normal limits  COMPREHENSIVE METABOLIC PANEL - Abnormal; Notable for the following:    Glucose, Bld 42 (*)    BUN 25 (*)    Creatinine, Ser 1.48 (*)    Total Bilirubin <0.2 (*)    GFR calc non Af Amer 34 (*)    GFR calc Af Amer 39 (*)    All other components within normal limits  URINALYSIS, ROUTINE W REFLEX MICROSCOPIC - Abnormal; Notable for the following:    Glucose, UA 250 (*)    Protein, ur 30 (*)    All other components within normal limits  URINE MICROSCOPIC-ADD ON - Abnormal; Notable for the following:    Squamous Epithelial / LPF FEW (*)    Casts HYALINE CASTS (*)    All other components within normal limits  CBG MONITORING, ED - Abnormal; Notable for the following:    Glucose-Capillary 148 (*)    All other components within normal limits  CBG MONITORING, ED - Abnormal; Notable for the following:    Glucose-Capillary 50 (*)    All other components within normal limits  CBG MONITORING, ED - Abnormal; Notable for the following:    Glucose-Capillary 126 (*)    All other  components within normal limits  URINE CULTURE  PROTIME-INR  APTT  DIFFERENTIAL  I-STAT TROPOININ, ED    Imaging Review Ct Head (brain) Wo Contrast  03/20/2014   CLINICAL DATA:  Left-sided weakness  EXAM: CT HEAD WITHOUT CONTRAST  TECHNIQUE: Contiguous axial images were obtained from the base of the skull through the vertex without intravenous contrast.  COMPARISON:  CT HEAD W/O CM dated 01/14/2014; MR HEAD W/O CM dated 01/09/2014  FINDINGS: There is mild diffuse cerebral and cerebellar atrophy with compensatory ventriculomegaly. These findings are stable. There is decreased density in the deep white matter of both cerebral hemispheres consistent with chronic small vessel ischemic type change. There are old lacunar infarctions in the basal ganglia bilaterally. There is no evidence of an acute intracranial hemorrhage. There is no objective evidence of an acute evolving ischemic infarction. There are basal ganglia calcifications bilaterally which are stable. The cerebellum and brainstem are normal in density.  At bone window settings the observed portions of paranasal sinuses and mastoid air cells are clear. There is no evidence of an acute skull fracture.  IMPRESSION: 1. There is no evidence of an acute ischemic infarction. There are old lacunar infarctions in both cerebral hemispheres, and there is decreased density in the deep white matter of both cerebral hemispheres consistent with chronic small vessel ischemia. 2. There is no acute intracranial hemorrhage. 3. There are stable basal ganglia calcifications bilaterally. 4. These results were called by telephone at the time of interpretation on 03/20/2014 at 10:07 PM to Dr. Roseanne RenoStewart, who verbally acknowledged these results.   Electronically Signed   By: David  [place]   On: 03/20/2014 22:09     EKG Interpretation   Date/Time:  Friday Mar 20 2014 22:11:59 EDT Ventricular Rate:  74 PR Interval:  161 QRS Duration: 87 QT Interval:  413 QTC Calculation:  458 R Axis:   66 Text Interpretation:  Sinus rhythm Nonspecific T abnormalities, inferior  leads Minimal ST elevation, anterior leads since last tracing no  significant  change Confirmed by Effie ShyWENTZ  MD, Ayriana Wix [phone]([id number]) on 03/20/2014  11:16:29 PM      MDM   Final diagnoses:  CVA (cerebral infarction)  Hypoglycemia    CVA with left the right-sided symptoms,  Recurrent. She will need to be admitted for further observation, evaluation, and treatment.   Nursing Notes Reviewed/ Care Coordinated, and agree without changes. Applicable Imaging Reviewed.  Interpretation of Laboratory Data incorporated into ED treatment  Plan: Admit    Flint MelterElliott L Golden Emile, MD 03/21/14 [phone]

## 2014-03-20 NOTE — ED Notes (Signed)
cbg 140 

## 2014-03-20 NOTE — ED Notes (Signed)
Per EMS: LSW 2100, pt c/o left sided weakness and slurred speech. Pt with hx of stroke in March with left sided weakness as a result. Pt denies an pain, skin warm and dry, axo x4. Upon arrival to Ed CBG noted to be 42 by ED staff.

## 2014-03-20 NOTE — Consult Note (Signed)
Referring Physician: Dr. Effie ShyWentz    Chief Complaint: Left-sided weakness and slurred speech.  HPI: [name] [name] is an 73 y.o. female a history of hypertension, hyperlipidemia, diabetes mellitus and stroke in March 2015, presenting with exacerbation of left-sided weakness as well as slurred speech. Onset was at 9 PM tonight. She was also noted to be hypoglycemic with blood sugar 42. She was given one amp of D50. Repeat blood sugar was 148. Weakness of the left side was persistent, particularly involving her left leg. CT scan of her head showed no acute intracranial abnormality. Patient has been on Plavix for antiplatelet therapy. NIH stroke score was 4.  LSN: 9 PM on 03/20/2014 tPA Given: No: Recent stroke (01/09/2014). MRankin: 2  Past Medical History  Diagnosis Date  . Hypertension   . Diabetes mellitus   . Stroke   . Chronic renal insufficiency   . Hyperlipidemia   . Osteopenia   . CVA (cerebral infarction)     Family History  Problem Relation Age of Onset  . Diabetes Mother   . Hypertension Mother      Medications: I have reviewed the patient's current medications.  ROS: History obtained from spouse and the patient  General ROS: negative for - chills, fatigue, fever, night sweats, weight gain or weight loss Psychological ROS: negative for - behavioral disorder, hallucinations, memory difficulties, mood swings or suicidal ideation Ophthalmic ROS: negative for - blurry vision, double vision, eye pain or loss of vision ENT ROS: negative for - epistaxis, nasal discharge, oral lesions, sore throat, tinnitus or vertigo Allergy and Immunology ROS: negative for - hives or itchy/watery eyes Hematological and Lymphatic ROS: negative for - bleeding problems, bruising or swollen lymph nodes Endocrine ROS: negative for - galactorrhea, hair pattern changes, polydipsia/polyuria or temperature intolerance Respiratory ROS: negative for - cough, hemoptysis, shortness of breath or  wheezing Cardiovascular ROS: negative for - chest pain, dyspnea on exertion, edema or irregular heartbeat Gastrointestinal ROS: negative for - abdominal pain, diarrhea, hematemesis, nausea/vomiting or stool incontinence Genito-Urinary ROS: negative for - dysuria, hematuria, incontinence or urinary frequency/urgency Musculoskeletal ROS: negative for - joint swelling or muscular weakness Neurological ROS: as noted in HPI Dermatological ROS: negative for rash and skin lesion changes  Physical Examination: Blood pressure 152/51, pulse 63, temperature 98.5 F (36.9 C), temperature source Oral, resp. rate 16, SpO2 98.00%.  Neurologic Examination: Mental Status: Alert, oriented, thought content appropriate.  Speech slightly slurred without evidence of aphasia. Able to follow commands without difficulty. Cranial Nerves: II-difficult to assess due to the poor visual acuity, including with glasses. III/IV/VI-Pupils were equal and reacted. Extraocular movements were full and conjugate.    V/VII-slight left facial numbness to tactile sensation; no facial weakness. VIII-normal. X- mild dysarthria. Motor: Moderately severe weakness of left lower extremity proximally; normal strength of left upper extremity as well as right upper and lower extremities; flaccid muscle tone throughout. Sensory: Reduced perception of tactile sensation over left extremities compared to right extremities. Deep Tendon Reflexes: Trace to 1+ and symmetric. Plantars: Mute bilaterally Cerebellar: Difficult to assess because of her poor vision.   Ct Head (brain) Wo Contrast  03/20/2014   CLINICAL DATA:  Left-sided weakness  EXAM: CT HEAD WITHOUT CONTRAST  TECHNIQUE: Contiguous axial images were obtained from the base of the skull through the vertex without intravenous contrast.  COMPARISON:  CT HEAD W/O CM dated 01/14/2014; MR HEAD W/O CM dated 01/09/2014  FINDINGS: There is mild diffuse cerebral and cerebellar atrophy with  compensatory ventriculomegaly. These  findings are stable. There is decreased density in the deep white matter of both cerebral hemispheres consistent with chronic small vessel ischemic type change. There are old lacunar infarctions in the basal ganglia bilaterally. There is no evidence of an acute intracranial hemorrhage. There is no objective evidence of an acute evolving ischemic infarction. There are basal ganglia calcifications bilaterally which are stable. The cerebellum and brainstem are normal in density.  At bone window settings the observed portions of paranasal sinuses and mastoid air cells are clear. There is no evidence of an acute skull fracture.  IMPRESSION: 1. There is no evidence of an acute ischemic infarction. There are old lacunar infarctions in both cerebral hemispheres, and there is decreased density in the deep white matter of both cerebral hemispheres consistent with chronic small vessel ischemia. 2. There is no acute intracranial hemorrhage. 3. There are stable basal ganglia calcifications bilaterally. 4. These results were called by telephone at the time of interpretation on 03/20/2014 at 10:07 PM to Dr. Roseanne RenoStewart, who verbally acknowledged these results.   Electronically Signed   By: David  [place]   On: 03/20/2014 22:09    Assessment: 73 y.o. female with multiple risk factors for stroke as well as previous stroke, presenting with probable recurrent right subcortical cerebral infarction.  Stroke Risk Factors - diabetes mellitus, hyperlipidemia and hypertension  Plan: 1. HgbA1c 2. MRI  of the brain without contrast 3. PT consult, OT consult 4. Prophylactic therapy-Antiplatelet med: Plavix 75 mg per day (4 aspirin 300 mg per rectum daily if unable to swallow) 5. Risk factor modification 6. Telemetry monitoring   C.R. Roseanne RenoStewart, MD [facility] [phone]  03/20/2014, 10:16 PM

## 2014-03-20 NOTE — ED Notes (Signed)
CBG 140  

## 2014-03-20 NOTE — ED Notes (Addendum)
This RN went into pt room to do 2hour neuro check, pt lethargic states "I don't feel good, I'm sleepy." RN asked pt her name pt would go back to sleep, CBG checked and noted to be 50. Dr. Effie ShyWentz [facility] Will recheck CBG in 15 min.

## 2014-03-20 NOTE — ED Notes (Addendum)
CBG was 42, RN notified.

## 2014-03-20 NOTE — ED Notes (Addendum)
Pt earrings, wallet, clothing, and shoes given to family (husbabd) at bedside.

## 2014-03-21 ENCOUNTER — Encounter (HOSPITAL_COMMUNITY): Payer: Self-pay | Admitting: Internal Medicine

## 2014-03-21 ENCOUNTER — Inpatient Hospital Stay (HOSPITAL_COMMUNITY): Payer: Medicare HMO

## 2014-03-21 DIAGNOSIS — R471 Dysarthria and anarthria: Secondary | ICD-10-CM | POA: Diagnosis present

## 2014-03-21 DIAGNOSIS — E1165 Type 2 diabetes mellitus with hyperglycemia: Secondary | ICD-10-CM

## 2014-03-21 DIAGNOSIS — E1149 Type 2 diabetes mellitus with other diabetic neurological complication: Secondary | ICD-10-CM | POA: Diagnosis present

## 2014-03-21 DIAGNOSIS — E162 Hypoglycemia, unspecified: Secondary | ICD-10-CM | POA: Diagnosis present

## 2014-03-21 DIAGNOSIS — N183 Chronic kidney disease, stage 3 unspecified: Secondary | ICD-10-CM | POA: Diagnosis present

## 2014-03-21 DIAGNOSIS — I129 Hypertensive chronic kidney disease with stage 1 through stage 4 chronic kidney disease, or unspecified chronic kidney disease: Secondary | ICD-10-CM | POA: Diagnosis present

## 2014-03-21 DIAGNOSIS — F17200 Nicotine dependence, unspecified, uncomplicated: Secondary | ICD-10-CM | POA: Diagnosis present

## 2014-03-21 DIAGNOSIS — Z8249 Family history of ischemic heart disease and other diseases of the circulatory system: Secondary | ICD-10-CM | POA: Diagnosis not present

## 2014-03-21 DIAGNOSIS — E039 Hypothyroidism, unspecified: Secondary | ICD-10-CM | POA: Diagnosis present

## 2014-03-21 DIAGNOSIS — M899 Disorder of bone, unspecified: Secondary | ICD-10-CM | POA: Diagnosis present

## 2014-03-21 DIAGNOSIS — Q2111 Secundum atrial septal defect: Secondary | ICD-10-CM | POA: Diagnosis not present

## 2014-03-21 DIAGNOSIS — I10 Essential (primary) hypertension: Secondary | ICD-10-CM

## 2014-03-21 DIAGNOSIS — Z794 Long term (current) use of insulin: Secondary | ICD-10-CM | POA: Diagnosis not present

## 2014-03-21 DIAGNOSIS — I69998 Other sequelae following unspecified cerebrovascular disease: Secondary | ICD-10-CM | POA: Diagnosis not present

## 2014-03-21 DIAGNOSIS — G8190 Hemiplegia, unspecified affecting unspecified side: Secondary | ICD-10-CM | POA: Diagnosis present

## 2014-03-21 DIAGNOSIS — E1129 Type 2 diabetes mellitus with other diabetic kidney complication: Secondary | ICD-10-CM

## 2014-03-21 DIAGNOSIS — E785 Hyperlipidemia, unspecified: Secondary | ICD-10-CM | POA: Diagnosis present

## 2014-03-21 DIAGNOSIS — H548 Legal blindness, as defined in USA: Secondary | ICD-10-CM | POA: Diagnosis present

## 2014-03-21 DIAGNOSIS — Q211 Atrial septal defect: Secondary | ICD-10-CM | POA: Diagnosis not present

## 2014-03-21 DIAGNOSIS — I6350 Cerebral infarction due to unspecified occlusion or stenosis of unspecified cerebral artery: Principal | ICD-10-CM

## 2014-03-21 DIAGNOSIS — N058 Unspecified nephritic syndrome with other morphologic changes: Secondary | ICD-10-CM | POA: Diagnosis present

## 2014-03-21 DIAGNOSIS — Z7902 Long term (current) use of antithrombotics/antiplatelets: Secondary | ICD-10-CM | POA: Diagnosis not present

## 2014-03-21 DIAGNOSIS — Z833 Family history of diabetes mellitus: Secondary | ICD-10-CM | POA: Diagnosis not present

## 2014-03-21 DIAGNOSIS — R29898 Other symptoms and signs involving the musculoskeletal system: Secondary | ICD-10-CM | POA: Diagnosis present

## 2014-03-21 DIAGNOSIS — E1142 Type 2 diabetes mellitus with diabetic polyneuropathy: Secondary | ICD-10-CM | POA: Diagnosis present

## 2014-03-21 LAB — GLUCOSE, CAPILLARY
GLUCOSE-CAPILLARY: 73 mg/dL (ref 70–99)
GLUCOSE-CAPILLARY: 206 mg/dL — AB (ref 70–99)
Glucose-Capillary: 267 mg/dL — ABNORMAL HIGH (ref 70–99)
Glucose-Capillary: 253 mg/dL — ABNORMAL HIGH (ref 70–99)
Glucose-Capillary: 193 mg/dL — ABNORMAL HIGH (ref 70–99)

## 2014-03-21 LAB — CBC
HCT: 30.7 % — ABNORMAL LOW (ref 36.0–46.0)
Hemoglobin: 10.4 g/dL — ABNORMAL LOW (ref 12.0–15.0)
MCH: 29.7 pg (ref 26.0–34.0)
MCHC: 33.9 g/dL (ref 30.0–36.0)
MCV: 87.7 fL (ref 78.0–100.0)
PLATELETS: 242 10*3/uL (ref 150–400)
RBC: 3.5 MIL/uL — ABNORMAL LOW (ref 3.87–5.11)
RDW: 12.6 % (ref 11.5–15.5)
WBC: 5.7 10*3/uL (ref 4.0–10.5)

## 2014-03-21 LAB — BASIC METABOLIC PANEL
BUN: 26 mg/dL — ABNORMAL HIGH (ref 6–23)
CHLORIDE: 101 meq/L (ref 96–112)
CO2: 31 mEq/L (ref 19–32)
CREATININE: 1.35 mg/dL — AB (ref 0.50–1.10)
Calcium: 8.7 mg/dL (ref 8.4–10.5)
GFR calc non Af Amer: 38 mL/min — ABNORMAL LOW (ref >90)
GFR, EST AFRICAN AMERICAN: 44 mL/min — AB (ref >90)
Glucose, Bld: 47 mg/dL — ABNORMAL LOW (ref 70–99)
POTASSIUM: 3.5 meq/L — AB (ref 3.7–5.3)
SODIUM: 142 meq/L (ref 137–147)

## 2014-03-21 LAB — HEMOGLOBIN A1C
Hgb A1c MFr Bld: 9.9 % — ABNORMAL HIGH (ref <5.7)
Mean Plasma Glucose: 237 mg/dL — ABNORMAL HIGH (ref <117)

## 2014-03-21 LAB — LIPID PANEL
CHOL/HDL RATIO: 3.9 ratio
Cholesterol: 186 mg/dL (ref 0–200)
HDL: 48 mg/dL (ref >39)
LDL Cholesterol: 108 mg/dL — ABNORMAL HIGH (ref 0–99)
Triglycerides: 150 mg/dL — ABNORMAL HIGH (ref <150)
VLDL: 30 mg/dL (ref 0–40)

## 2014-03-21 LAB — TSH: TSH: 0.869 u[IU]/mL (ref 0.350–4.500)

## 2014-03-21 LAB — CBG MONITORING, ED: Glucose-Capillary: 41 mg/dL — CL (ref 70–99)

## 2014-03-21 MED ORDER — HYDRALAZINE HCL 20 MG/ML IJ SOLN: 10.0000 mg | Freq: Four times a day (QID) | INTRAMUSCULAR | Status: DC | PRN

## 2014-03-21 MED ORDER — METOPROLOL SUCCINATE ER 25 MG PO TB24
25.0000 mg | ORAL_TABLET | Freq: Two times a day (BID) | ORAL | Status: DC
  Administered 2014-03-21 – 2014-03-24 (×7): 25 mg via ORAL
  Filled 2014-03-21 (×9): qty 1

## 2014-03-21 MED ORDER — NAPROXEN 500 MG PO TABS
500.0000 mg | ORAL_TABLET | Freq: Two times a day (BID) | ORAL | Status: DC | PRN
  Filled 2014-03-21: qty 1

## 2014-03-21 MED ORDER — CLOPIDOGREL BISULFATE 75 MG PO TABS
75.0000 mg | ORAL_TABLET | Freq: Every day | ORAL | Status: DC
Start: 2014-03-21 — End: 2014-03-24
  Administered 2014-03-22 – 2014-03-24 (×3): 75 mg via ORAL
  Filled 2014-03-21 (×4): qty 1

## 2014-03-21 MED ORDER — MELOXICAM 7.5 MG PO TABS
7.5000 mg | ORAL_TABLET | Freq: Every day | ORAL | Status: DC
  Administered 2014-03-21: 7.5 mg via ORAL
  Filled 2014-03-21 (×2): qty 1

## 2014-03-21 MED ORDER — POTASSIUM CHLORIDE 20 MEQ/15ML (10%) PO LIQD
40.0000 meq | Freq: Once | ORAL | Status: AC
  Administered 2014-03-21: 40 meq via ORAL
  Filled 2014-03-21: qty 30

## 2014-03-21 MED ORDER — DEXTROSE 50 % IV SOLN
INTRAVENOUS | Status: AC
  Filled 2014-03-21: qty 50

## 2014-03-21 MED ORDER — PNEUMOCOCCAL VAC POLYVALENT 25 MCG/0.5ML IJ INJ
0.5000 mL | INJECTION | INTRAMUSCULAR | Status: DC
  Filled 2014-03-21: qty 0.5

## 2014-03-21 MED ORDER — ATORVASTATIN CALCIUM 40 MG PO TABS
40.0000 mg | ORAL_TABLET | Freq: Every day | ORAL | Status: DC
  Administered 2014-03-21 – 2014-03-23 (×3): 40 mg via ORAL
  Filled 2014-03-21 (×4): qty 1

## 2014-03-21 MED ORDER — VITAMIN D3 25 MCG (1000 UNIT) PO TABS
2000.0000 [IU] | ORAL_TABLET | Freq: Every day | ORAL | Status: DC
  Administered 2014-03-21 – 2014-03-24 (×4): 2000 [IU] via ORAL
  Filled 2014-03-21 (×4): qty 2

## 2014-03-21 MED ORDER — INSULIN ASPART 100 UNIT/ML ~~LOC~~ SOLN
0.0000 [IU] | Freq: Three times a day (TID) | SUBCUTANEOUS | Status: DC
  Administered 2014-03-21: 3 [IU] via SUBCUTANEOUS

## 2014-03-21 MED ORDER — POTASSIUM CHLORIDE CRYS ER 20 MEQ PO TBCR
20.0000 meq | EXTENDED_RELEASE_TABLET | Freq: Two times a day (BID) | ORAL | Status: DC
  Administered 2014-03-21: 20 meq via ORAL
  Filled 2014-03-21: qty 1

## 2014-03-21 MED ORDER — INSULIN ASPART 100 UNIT/ML ~~LOC~~ SOLN
0.0000 [IU] | Freq: Three times a day (TID) | SUBCUTANEOUS | Status: DC
  Administered 2014-03-21: 5 [IU] via SUBCUTANEOUS
  Administered 2014-03-22: 3 [IU] via SUBCUTANEOUS
  Administered 2014-03-22 (×2): 5 [IU] via SUBCUTANEOUS
  Administered 2014-03-23: 3 [IU] via SUBCUTANEOUS
  Administered 2014-03-23 – 2014-03-24 (×2): 1 [IU] via SUBCUTANEOUS
  Administered 2014-03-24: 3 [IU] via SUBCUTANEOUS

## 2014-03-21 MED ORDER — SODIUM POLYSTYRENE SULFONATE 15 GM/60ML PO SUSP: 15.0000 g | Freq: Once | ORAL | Status: DC

## 2014-03-21 MED ORDER — HYDRALAZINE HCL 20 MG/ML IJ SOLN
10.0000 mg | Freq: Four times a day (QID) | INTRAMUSCULAR | Status: DC | PRN
  Administered 2014-03-22 – 2014-03-23 (×2): 10 mg via INTRAVENOUS
  Filled 2014-03-21 (×2): qty 1

## 2014-03-21 MED ORDER — DEXTROSE-NACL 5-0.9 % IV SOLN
INTRAVENOUS | Status: DC
  Administered 2014-03-21: 04:00:00 via INTRAVENOUS

## 2014-03-21 MED ORDER — INSULIN ASPART 100 UNIT/ML ~~LOC~~ SOLN: 0.0000 [IU] | Freq: Every day | SUBCUTANEOUS | Status: DC

## 2014-03-21 MED ORDER — LISINOPRIL 20 MG PO TABS
20.0000 mg | ORAL_TABLET | Freq: Every day | ORAL | Status: DC
  Administered 2014-03-21: 20 mg via ORAL
  Filled 2014-03-21: qty 1

## 2014-03-21 MED ORDER — PANTOPRAZOLE SODIUM 40 MG PO TBEC
80.0000 mg | DELAYED_RELEASE_TABLET | Freq: Every day | ORAL | Status: DC
  Administered 2014-03-21 – 2014-03-24 (×4): 80 mg via ORAL
  Filled 2014-03-21 (×4): qty 2

## 2014-03-21 MED ORDER — SODIUM CHLORIDE 0.9 % IV SOLN
INTRAVENOUS | Status: DC
  Administered 2014-03-21: 14:00:00 via INTRAVENOUS

## 2014-03-21 MED ORDER — GABAPENTIN 300 MG PO CAPS
300.0000 mg | ORAL_CAPSULE | Freq: Two times a day (BID) | ORAL | Status: DC
  Administered 2014-03-21 – 2014-03-24 (×7): 300 mg via ORAL
  Filled 2014-03-21 (×9): qty 1

## 2014-03-21 MED ORDER — LEVOTHYROXINE SODIUM 50 MCG PO TABS
50.0000 ug | ORAL_TABLET | Freq: Every day | ORAL | Status: DC
  Administered 2014-03-21 – 2014-03-24 (×4): 50 ug via ORAL
  Filled 2014-03-21 (×6): qty 1

## 2014-03-21 MED ORDER — DEXTROSE 5 % IV SOLN: INTRAVENOUS | Status: DC

## 2014-03-21 MED ORDER — HYDROCHLOROTHIAZIDE 25 MG PO TABS
25.0000 mg | ORAL_TABLET | Freq: Every day | ORAL | Status: DC
  Administered 2014-03-21 – 2014-03-24 (×4): 25 mg via ORAL
  Filled 2014-03-21 (×4): qty 1

## 2014-03-21 MED ORDER — PNEUMOCOCCAL VAC POLYVALENT 25 MCG/0.5ML IJ INJ: 0.5000 mL | INJECTION | INTRAMUSCULAR | Status: DC

## 2014-03-21 MED ORDER — SENNOSIDES-DOCUSATE SODIUM 8.6-50 MG PO TABS: 1.0000 | ORAL_TABLET | Freq: Every evening | ORAL | Status: DC | PRN

## 2014-03-21 MED ORDER — FUROSEMIDE 40 MG PO TABS
40.0000 mg | ORAL_TABLET | Freq: Every day | ORAL | Status: DC
  Administered 2014-03-21: 40 mg via ORAL
  Filled 2014-03-21: qty 1

## 2014-03-21 MED ORDER — ENOXAPARIN SODIUM 40 MG/0.4ML ~~LOC~~ SOLN
40.0000 mg | Freq: Every day | SUBCUTANEOUS | Status: DC
  Administered 2014-03-21 – 2014-03-24 (×4): 40 mg via SUBCUTANEOUS
  Filled 2014-03-21 (×4): qty 0.4

## 2014-03-21 MED ORDER — ATORVASTATIN CALCIUM 20 MG PO TABS
20.0000 mg | ORAL_TABLET | Freq: Every day | ORAL | Status: DC
  Filled 2014-03-21: qty 1

## 2014-03-21 MED ORDER — DIPHENHYDRAMINE HCL 25 MG PO CAPS: 25.0000 mg | ORAL_CAPSULE | Freq: Four times a day (QID) | ORAL | Status: DC | PRN

## 2014-03-21 MED ORDER — DEXTROSE 50 % IV SOLN
1.0000 | Freq: Once | INTRAVENOUS | Status: AC
  Administered 2014-03-21: 50 mL via INTRAVENOUS

## 2014-03-21 NOTE — Progress Notes (Signed)
Patient Demographics  [name] GowdaBetty [name], is a 73 y.o. female, DOB - 1941/04/11, ZOX:[id number]  Admit date - 03/20/2014   Admitting Physician Cristal FordSrikar A Reddy, MD  Outpatient Primary MD for the patient is Alva GarnetSHELTON,KIMBERLY R., MD  LOS - 1   Chief Complaint  Patient presents with  . Code Stroke        Assessment & Plan    Acute CVA with recent history of CVA in March 2015  Acute on chronic left-sided weakness, could be induced by hypoglycemia however TIA CVA cannot be ruled out. Head CT unnremarkable. Full stroke workup ending which includes  Monitoring on tele, obtain PT, OT, speech input, A1c and lipid panel, check MRI MRA brain, echogram, carotid duplex, neurology following, currently on Plavix, will increase home dose statin doses and he'll greater than 100.   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 Check A1c, hold all hypoglycemic agents orally along with Glucophage, low-dose sliding scale insulin, gentle D5 drip. Random TSH and cortisol.    Hypertension  Continue home antihypertensive medications. When necessary hydralazine for systolic blood pressure greater than 180, allow permissive hypertension for the next 24 hours.    Hyperlipidemia  Continue statin at higher than home dose his LDL greater than 100   ARF on Chronic kidney disease stage III  Baseline creatinine is 1.2, renal function slightly elevated. We'll hold NSAIDs and diuretic, gentle D5W  repeat BMP in the morning.   Code Status: Full  Family Communication: Husband Bedside  Disposition Plan: Home   Procedures CT Head, MRI MRA brain, echo, carotid   Consults  Neuro   Medications  Scheduled Meds: . atorvastatin  20 mg Oral q1800  . cholecalciferol  2,000 Units Oral Daily  . clopidogrel  75 mg Oral Q breakfast  . enoxaparin (LOVENOX) injection  40 mg Subcutaneous Daily  . gabapentin  300 mg Oral BID  . hydrochlorothiazide  25 mg Oral Daily  . levothyroxine  50 mcg Oral QAC breakfast  . metoprolol succinate  25 mg Oral BID  . pantoprazole  80 mg Oral Daily  . pneumococcal 23 valent vaccine  0.5 mL Intramuscular Tomorrow-1000  . sodium polystyrene  15 g Oral Once   Continuous Infusions: . dextrose    . dextrose 5 % and 0.9% NaCl 75 mL/hr at 03/21/14 0345   PRN Meds:.diphenhydrAMINE, hydrALAZINE, senna-docusate  DVT Prophylaxis  Lovenox   Lab Results  Component Value Date   PLT 242 03/21/2014    Antibiotics     Anti-infectives   None          Subjective:   [name] GowdaBetty [name] today has, No headache, No chest pain, No abdominal pain - No Nausea, No new weakness tingling or numbness, No Cough - SOB. Some acute on chronic left-sided weakness  Objective:   Filed Vitals:  03/21/14 0209 03/21/14 0401 03/21/14 0612 03/21/14 0935  BP: 144/43 151/45 165/66 172/53  Pulse: 72 70  69  Temp: 98 F (36.7 C) 97.8 F (36.6 C)  98.9 F (37.2 C)  TempSrc: Oral Oral  Oral  Resp: 22 20  20   SpO2: 100% 100%  100%    Wt Readings from Last 3 Encounters:  01/10/14 87.98 kg (193 lb 15.4 oz)  05/13/13 87.998 kg (194 lb)    No intake or output data in the 24 hours ending 03/21/14 1217   Physical Exam  Awake Alert, Oriented X 3, No new F.N deficits, Normal affect [place].AT,PERRAL Supple Neck,No JVD, No cervical lymphadenopathy appriciated. Mild L sided weakness Symmetrical Chest wall movement, Good air movement bilaterally, CTAB RRR,No Gallops,Rubs or new  Murmurs, No Parasternal Heave +ve B.Sounds, Abd Soft, Non tender, No organomegaly appriciated, No rebound - guarding or rigidity. No Cyanosis, Clubbing or edema, No new Rash or bruise      Data Review   Micro Results No results found for this or any previous visit (from the past 240 hour(s)).  Radiology Reports Ct Head (brain) Wo Contrast  03/20/2014   CLINICAL DATA:  Left-sided weakness  EXAM: CT HEAD WITHOUT CONTRAST  TECHNIQUE: Contiguous axial images were obtained from the base of the skull through the vertex without intravenous contrast.  COMPARISON:  CT HEAD W/O CM dated 01/14/2014; MR HEAD W/O CM dated 01/09/2014  FINDINGS: There is mild diffuse cerebral and cerebellar atrophy with compensatory ventriculomegaly. These findings are stable. There is decreased density in the deep white matter of both cerebral hemispheres consistent with chronic small vessel ischemic type change. There are old lacunar infarctions in the basal ganglia bilaterally. There is no evidence of an acute intracranial hemorrhage. There is no objective evidence of an acute evolving ischemic infarction. There are basal ganglia calcifications bilaterally which are stable. The cerebellum and brainstem are normal in density.  At bone window settings the observed portions of paranasal sinuses and mastoid air cells are clear. There is no evidence of an acute skull fracture.  IMPRESSION: 1. There is no evidence of an acute ischemic infarction. There are old lacunar infarctions in both cerebral hemispheres, and there is decreased density in the deep white matter of both cerebral hemispheres consistent with chronic small vessel ischemia. 2. There is no acute intracranial hemorrhage. 3. There are stable basal ganglia calcifications bilaterally. 4. These results were called by telephone at the time of interpretation on 03/20/2014 at 10:07 PM to Dr. Roseanne Reno, who verbally acknowledged these results.   Electronically Signed   By: David  [place]    On: 03/20/2014 22:09    CBC  Recent Labs Lab 03/20/14 2156 03/21/14 0440  WBC 6.4 5.7  HGB 11.9* 10.4*  HCT 34.3* 30.7*  PLT 256 242  MCV 88.2 87.7  MCH 30.6 29.7  MCHC 34.7 33.9  RDW 12.4 12.6  LYMPHSABS 2.5  --   MONOABS 0.7  --   EOSABS 0.1  --   BASOSABS 0.0  --     Chemistries   Recent Labs Lab 03/20/14 2156 03/21/14 0440  NA 141 142  K 3.8 3.5*  CL 99 101  CO2 30 31  GLUCOSE 42* 47*  BUN 25* 26*  CREATININE 1.48* 1.35*  CALCIUM 9.5 8.7  AST 15  --   ALT 7  --   ALKPHOS 62  --   BILITOT <0.2*  --    ------------------------------------------------------------------------------------------------------------------ CrCl is unknown because both a height and  weight (above a minimum accepted value) are required for this calculation. ------------------------------------------------------------------------------------------------------------------ No results found for this basename: HGBA1C,  in the last 72 hours ------------------------------------------------------------------------------------------------------------------  Recent Labs  03/21/14 0440  CHOL 186  HDL 48  LDLCALC 108*  TRIG 150*  CHOLHDL 3.9   ------------------------------------------------------------------------------------------------------------------ No results found for this basename: TSH, T4TOTAL, FREET3, T3FREE, THYROIDAB,  in the last 72 hours ------------------------------------------------------------------------------------------------------------------ No results found for this basename: VITAMINB12, FOLATE, FERRITIN, TIBC, IRON, RETICCTPCT,  in the last 72 hours  Coagulation profile  Recent Labs Lab 03/20/14 2156  INR 1.03    No results found for this basename: DDIMER,  in the last 72 hours  Cardiac Enzymes No results found for this basename: CK, CKMB, TROPONINI, MYOGLOBIN,  in the last 168  hours ------------------------------------------------------------------------------------------------------------------ No components found with this basename: POCBNP,      Time Spent in minutes  35   Leroy SeaPrashant K Singh M.D on 03/21/2014 at 12:17 PM  Between 7am to 7pm - Pager - [phone]  After 7pm go to [email or web address] - password [email or web address]  And look for the night coverage person covering for me after hours  [facility] Office  [phone]

## 2014-03-21 NOTE — ED Notes (Signed)
Cbg at 8241; Dr. Norlene Campbelltter notified for stat order

## 2014-03-21 NOTE — ED Notes (Signed)
Hospitlaist MD at bedside.

## 2014-03-21 NOTE — ED Notes (Signed)
Hosptialist notified of Cbg of 41

## 2014-03-21 NOTE — Progress Notes (Signed)
Patient transferred from ED to bed 4N20 at 0205 . She is lethargic but in no acute distress. Husband is at bedside. Ronni Osterberg Hilbert OdorN Endya Austin, RN

## 2014-03-21 NOTE — H&P (Signed)
Patient's PCP: Alva GarnetSHELTON,KIMBERLY R., MD  Chief Complaint: Slurred speech and left-sided weakness  History of Present Illness: [name] [name] is a 73 y.o. African American female with history of hypertension, diabetes, CVA, chronic kidney disease stage III, hyperlipidemia, and osteopenia who presents with the above complaints.  She is a poor historian at this time, but most of the history was obtained from the patient.  Apparently at 9 p.m. yesterday, patient was trying to eat with her right hand as she could not use her fork to eat.  She also described left-sided weakness with "swelling on left side."  She was brought to the emergency department as code stroke.  Patient in the emergency department was found to have had multiple episodes of hypoglycemia and has required at least 3 amps of D50 to bring her blood sugars up.  Patient not given TPA due to recent stroke in March of 2015.  Patient was evaluated by neurology.  Hospitalist service was asked to admit the patient for further care and management.  She denies any recent fevers, chills, chest pain, shortness of breath, abdominal pain, diarrhea, headaches or vision changes.  Patient had slurred speech which improved after she received D50 during my interview of the patient.  Review of Systems: All systems reviewed with the patient and positive as per history of present illness, otherwise all other systems are negative.  Past Medical History  Diagnosis Date  . Hypertension   . Diabetes mellitus   . Stroke   . Chronic renal insufficiency   . Hyperlipidemia   . Osteopenia   . CVA (cerebral infarction)    Past Surgical History  Procedure Laterality Date  . Eye surgery     Family History  Problem Relation Age of Onset  . Diabetes Mother   . Hypertension Mother    History   Social History  . Marital Status: Single    Spouse Name: N/A    Number of Children: N/A  . Years of Education: N/A   Occupational History  . Not on file.    Social History Main Topics  . Smoking status: Current Every Day Smoker -- 0.50 packs/day for 60 years    Types: Cigarettes  . Smokeless tobacco: Not on file     Comment: pt states "sometimes I quit smoking for about 2 weeks then I start back"  . Alcohol Use: Yes     Comment: occasionally  . Drug Use: No  . Sexual Activity: Not on file   Other Topics Concern  . Not on file   Social History Narrative  . No narrative on file   Allergies: Erythromycin; Iodine; and Penicillins  Home Meds: Prior to Admission medications   Medication Sig Start Date End Date Taking? Authorizing Provider  atorvastatin (LIPITOR) 20 MG tablet Take 1 tablet (20 mg total) by mouth daily at 6 PM. 01/16/14  Yes Rodolph Bonganiel V Thompson, MD  Cholecalciferol (VITAMIN D3) 2000 UNITS TABS Take 2,000 Units by mouth daily.   Yes Historical Provider, MD  diphenhydrAMINE (BENADRYL) 25 MG tablet Take 25 mg by mouth every 6 (six) hours as needed for allergies.   Yes Historical Provider, MD  furosemide (LASIX) 20 MG tablet Take 40 mg by mouth daily.    Yes Historical Provider, MD  gabapentin (NEURONTIN) 300 MG capsule Take 300 mg by mouth 2 (two) times daily.    Yes Historical Provider, MD  glimepiride (AMARYL) 4 MG tablet Take 4 mg by mouth 2 (two) times daily.  Yes Historical Provider, MD  hydrochlorothiazide (HYDRODIURIL) 25 MG tablet Take 25 mg by mouth daily.   Yes Historical Provider, MD  insulin NPH-regular (NOVOLIN 70/30) (70-30) 100 UNIT/ML injection Inject 40 Units into the skin daily as needed. Blood sugar >200 take insulin   Yes Historical Provider, MD  levothyroxine (SYNTHROID, LEVOTHROID) 50 MCG tablet Take 50 mcg by mouth daily.   Yes Historical Provider, MD  lisinopril (PRINIVIL,ZESTRIL) 20 MG tablet Take 20 mg by mouth daily.   Yes Historical Provider, MD  LUMIGAN 0.01 % SOLN Place 1 drop into both eyes at bedtime.  11/01/13  Yes Historical Provider, MD  meloxicam (MOBIC) 7.5 MG tablet Take 7.5 mg by mouth  daily.   Yes Historical Provider, MD  metFORMIN (GLUCOPHAGE) 1000 MG tablet Take 1,000 mg by mouth 2 (two) times daily with a meal.   Yes Historical Provider, MD  metoprolol succinate (TOPROL-XL) 25 MG 24 hr tablet Take 25 mg by mouth 2 (two) times daily.   Yes Historical Provider, MD  naproxen (NAPROSYN) 500 MG tablet Take 1 tablet by mouth 2 (two) times daily as needed for mild pain.  03/01/13  Yes Historical Provider, MD  omeprazole (PRILOSEC) 40 MG capsule Take 40 mg by mouth daily.   Yes Historical Provider, MD  PATADAY 0.2 % SOLN Place 1 drop into both eyes daily.  11/29/13  Yes Historical Provider, MD  potassium chloride SA (K-DUR,KLOR-CON) 20 MEQ tablet Take 20 mEq by mouth 2 (two) times daily.   Yes Historical Provider, MD    Physical Exam: Blood pressure 177/56, pulse 80, temperature 98.5 F (36.9 C), temperature source Oral, resp. rate 24, SpO2 98.00%. General: Awake, Oriented x3, No acute distress. HEENT: EOMI, Moist mucous membranes Neck: Supple CV: S1 and S2 Lungs: Clear to ascultation bilaterally Abdomen: Soft, Nontender, Nondistended, +bowel sounds. Ext: Good pulses. Trace edema. No clubbing or cyanosis noted. Neuro: Cranial Nerves II-XII grossly intact. Has 4/5 motor strength in left upper and lower extremities.  Has 5/5 motor strength in right upper and lower extremities.  Lab results:  Recent Labs  03/20/14 2156  NA 141  K 3.8  CL 99  CO2 30  GLUCOSE 42*  BUN 25*  CREATININE 1.48*  CALCIUM 9.5    Recent Labs  03/20/14 2156  AST 15  ALT 7  ALKPHOS 62  BILITOT <0.2*  PROT 7.4  ALBUMIN 3.6   No results found for this basename: LIPASE, AMYLASE,  in the last 72 hours  Recent Labs  03/20/14 2156  WBC 6.4  NEUTROABS 3.2  HGB 11.9*  HCT 34.3*  MCV 88.2  PLT 256   No results found for this basename: CKTOTAL, CKMB, CKMBINDEX, TROPONINI,  in the last 72 hours No components found with this basename: POCBNP,  No results found for this basename: DDIMER,   in the last 72 hours No results found for this basename: HGBA1C,  in the last 72 hours No results found for this basename: CHOL, HDL, LDLCALC, TRIG, CHOLHDL, LDLDIRECT,  in the last 72 hours No results found for this basename: TSH, T4TOTAL, FREET3, T3FREE, THYROIDAB,  in the last 72 hours No results found for this basename: VITAMINB12, FOLATE, FERRITIN, TIBC, IRON, RETICCTPCT,  in the last 72 hours Imaging results:  Ct Head (brain) Wo Contrast  03/20/2014   CLINICAL DATA:  Left-sided weakness  EXAM: CT HEAD WITHOUT CONTRAST  TECHNIQUE: Contiguous axial images were obtained from the base of the skull through the vertex without intravenous contrast.  COMPARISON:  CT HEAD W/O CM dated 01/14/2014; MR HEAD W/O CM dated 01/09/2014  FINDINGS: There is mild diffuse cerebral and cerebellar atrophy with compensatory ventriculomegaly. These findings are stable. There is decreased density in the deep white matter of both cerebral hemispheres consistent with chronic small vessel ischemic type change. There are old lacunar infarctions in the basal ganglia bilaterally. There is no evidence of an acute intracranial hemorrhage. There is no objective evidence of an acute evolving ischemic infarction. There are basal ganglia calcifications bilaterally which are stable. The cerebellum and brainstem are normal in density.  At bone window settings the observed portions of paranasal sinuses and mastoid air cells are clear. There is no evidence of an acute skull fracture.  IMPRESSION: 1. There is no evidence of an acute ischemic infarction. There are old lacunar infarctions in both cerebral hemispheres, and there is decreased density in the deep white matter of both cerebral hemispheres consistent with chronic small vessel ischemia. 2. There is no acute intracranial hemorrhage. 3. There are stable basal ganglia calcifications bilaterally. 4. These results were called by telephone at the time of interpretation on 03/20/2014 at 10:07 PM  to Dr. Roseanne RenoStewart, who verbally acknowledged these results.   Electronically Signed   By: David  [place]   On: 03/20/2014 22:09   Other results: EKG: Sinus rhythm with heart rate of 74.  Assessment & Plan by Problem: Acute CVA with recent history of CVA in March 2015 Admit the patient to telemetry.  Appreciate input from neurology.  Continue neuro checks.  Will have the patient on Plavix.  Patient had a 2-D echocardiogram on 01/10/2014 which showed mild LVH, and EF 60-65%, 2-D echocardiogram not ordered.  Patient also had a carotid Dopplers on 01/11/2014 which showed mild focal calcific plaque in distal CCA and origin ICA, on the left showed moderate calcific plaque with acoustic shadowing origin ICA, bilateral 1-39% ICA stenosis with vertebral artery antegrade flow, carotid Dopplers also not ordered.  Requests PT/OT and speech therapy evaluation.  Suspect patient's symptoms may also be due to hypoglycemia.  Check lipid panel and hemoglobin A1c in the morning to risk stratify the patient.  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  Hold patient's metformin, 70/30, and glipizide.  Start patient on D5 normal saline.  Sensitive sliding scale insulin.  Reassess patient's blood sugars in the morning to determine if patient needs to be restarted on lower dose of her diabetic regimen.  Hypertension Continue home antihypertensive medications.  When necessary hydralazine for systolic blood pressure greater than 180, allow permissive hypertension for the next 24 hours.  Hyperlipidemia Continue statin.  Chronic kidney disease stage III Baseline creatinine is 1.2, renal function slightly elevated.  Gently hydrate the patient on IV fluids.  Prophylaxis Lovenox.  CODE STATUS Full code.  Disposition Admit the patient to telemetry as inpatient.  Time spent on admission, talking to the patient, and coordinating care was: 60 mins.  Cristal FordSrikar A Trevin Gartrell, MD 03/21/2014, 1:29 AM

## 2014-03-21 NOTE — Progress Notes (Signed)
Stroke Team Progress Note  HISTORY [name] [name] is a 73 y.o. female with a history of hypertension, hyperlipidemia, diabetes mellitus and stroke in March 2015, presenting with exacerbation of left-sided weakness as well as slurred speech. Onset was at 9 PM 03/20/2014. She was also noted to be hypoglycemic with blood sugar 42. She was given one amp of D50. Repeat blood sugar was 148. Weakness of the left side was persistent, particularly involving her left leg. CT scan of her head showed no acute intracranial abnormality. Patient has been on Plavix for antiplatelet therapy. NIH stroke score was 4.    LSN: 9 PM on 03/20/2014  tPA Given: No: Recent stroke (01/09/2014).  MRankin: 2    SUBJECTIVE The patient's husband is at the bedside. The patient is very lethargic but can be aroused. She is oriented to place, situation, month and year. She follows most commands with prompting.  OBJECTIVE Most recent Vital Signs: Filed Vitals:   03/21/14 0209 03/21/14 0401 03/21/14 0612 03/21/14 0935  BP: 144/43 151/45 165/66 172/53  Pulse: 72 70  69  Temp: 98 F (36.7 C) 97.8 F (36.6 C)  98.9 F (37.2 C)  TempSrc: Oral Oral  Oral  Resp: 22 20  20   SpO2: 100% 100%  100%   CBG (last 3)   Recent Labs  03/20/14 2342 03/21/14 0054 03/21/14 0637  GLUCAP 126* 41* 73    IV Fluid Intake:   . dextrose 5 % and 0.9% NaCl 75 mL/hr at 03/21/14 0345    MEDICATIONS  . atorvastatin  20 mg Oral q1800  . cholecalciferol  2,000 Units Oral Daily  . clopidogrel  75 mg Oral Q breakfast  . enoxaparin (LOVENOX) injection  40 mg Subcutaneous Daily  . furosemide  40 mg Oral Daily  . gabapentin  300 mg Oral BID  . hydrochlorothiazide  25 mg Oral Daily  . insulin aspart  0-5 Units Subcutaneous QHS  . insulin aspart  0-9 Units Subcutaneous TID WC  . levothyroxine  50 mcg Oral QAC breakfast  . lisinopril  20 mg Oral Daily  . meloxicam  7.5 mg Oral Q breakfast  . metoprolol succinate  25 mg Oral BID  .  pantoprazole  80 mg Oral Daily  . pneumococcal 23 valent vaccine  0.5 mL Intramuscular Tomorrow-1000  . potassium chloride SA  20 mEq Oral BID   PRN:  diphenhydrAMINE, hydrALAZINE, naproxen, senna-docusate  Diet:  NPO no liquids Activity:   DVT Prophylaxis:  Lovenox  CLINICALLY SIGNIFICANT STUDIES Basic Metabolic Panel:  Recent Labs Lab 03/20/14 2156 03/21/14 0440  NA 141 142  K 3.8 3.5*  CL 99 101  CO2 30 31  GLUCOSE 42* 47*  BUN 25* 26*  CREATININE 1.48* 1.35*  CALCIUM 9.5 8.7   Liver Function Tests:  Recent Labs Lab 03/20/14 2156  AST 15  ALT 7  ALKPHOS 62  BILITOT <0.2*  PROT 7.4  ALBUMIN 3.6   CBC:  Recent Labs Lab 03/20/14 2156 03/21/14 0440  WBC 6.4 5.7  NEUTROABS 3.2  --   HGB 11.9* 10.4*  HCT 34.3* 30.7*  MCV 88.2 87.7  PLT 256 242   Coagulation:  Recent Labs Lab 03/20/14 2156  LABPROT 13.3  INR 1.03   Cardiac Enzymes: No results found for this basename: CKTOTAL, CKMB, CKMBINDEX, TROPONINI,  in the last 168 hours Urinalysis:  Recent Labs Lab 03/20/14 2236  COLORURINE YELLOW  LABSPEC 1.009  PHURINE 6.5  GLUCOSEU 250*  HGBUR NEGATIVE  BILIRUBINUR NEGATIVE  KETONESUR NEGATIVE  PROTEINUR 30*  UROBILINOGEN 0.2  NITRITE NEGATIVE  LEUKOCYTESUR NEGATIVE   Lipid Panel    Component Value Date/Time   CHOL 186 03/21/2014 0440   TRIG 150* 03/21/2014 0440   HDL 48 03/21/2014 0440   CHOLHDL 3.9 03/21/2014 0440   VLDL 30 03/21/2014 0440   LDLCALC 108* 03/21/2014 0440   HgbA1C  Lab Results  Component Value Date   HGBA1C 10.4* 01/10/2014    Urine Drug Screen:     Component Value Date/Time   LABOPIA NONE DETECTED 01/09/2014 2054   COCAINSCRNUR NONE DETECTED 01/09/2014 2054   LABBENZ NONE DETECTED 01/09/2014 2054   AMPHETMU NONE DETECTED 01/09/2014 2054   THCU NONE DETECTED 01/09/2014 2054   LABBARB NONE DETECTED 01/09/2014 2054    Alcohol Level: No results found for this basename: ETH,  in the last 168 hours  Ct Head (brain) Wo Contrast 03/20/2014     1. There is no evidence of an acute ischemic infarction. There are old lacunar infarctions in both cerebral hemispheres, and there is decreased density in the deep white matter of both cerebral hemispheres consistent with chronic small vessel ischemia.  2. There is no acute intracranial hemorrhage.  3. There are stable basal ganglia calcifications bilaterally.     MRI of the brain  pending  MRA of the brain  pending  2D Echocardiogram  01/10/2014 ejection fraction 60-65%. No cardiac source of emboli identified.  Carotid Doppler 01/11/2014  Right: Mild focal calcific plaque distal CCA and origin ICA. Left: Moderate calcific plaque with acoustic shadowing origin ICA.  Bilateral: 1-39% ICA stensois. Vertebral artery flow is antegrade.   CXR  pending  EKG  Sinus rhythm rate 74 beats per minute. For complete results please see formal report.   Therapy Recommendations pending  Physical Exam   Neurologic Examination:  Mental Status:  Lethargic but arousable, oriented, thought content appropriate. Speech slightly slurred without evidence of aphasia. Able to follow most commands with prompting. Poorly cooperative with exam. Cranial Nerves:  II-difficult to assess due to the poor visual acuity, including with glasses.  III/IV/VI-Pupils were equal and reacted. Extraocular movements were full and conjugate.  V/VII-slight left facial numbness to tactile sensation; mild left lower facial weakness.  VIII-normal.  X- mild dysarthria.  Motor: Moderately severe weakness of left lower extremity proximally; mild weakness of the left upper extremity.  Appears abnormal strength of the right upper and lower extremities; flaccid muscle tone throughout.  Sensory: Reduced perception of tactile sensation over left extremities compared to right extremities.  Deep Tendon Reflexes: Trace to 1+ and symmetric.  Plantars: Mute bilaterally  Cerebellar: Difficult to assess because of her poor  vision.   ASSESSMENT [name] [name] is a 73 y.o. female presenting with left hemiparesis and dysarthria. TPA was not initiated secondary to recent stroke in March. Head CT showed no acute infarct. MRI/MRA is pending.  Old lacunar infarcts noted with small vessel disease. On clopidogrel 75 mg orally every day prior to admission. Now on clopidogrel 75 mg orally every day for secondary stroke prevention. Patient with resultant left hemiplegia and left hemisensory deficits. Stroke work up underway.   Old lacunar infarcts in both hemispheres  Hyperlipidemia - Lipitor prior to admission. Cholesterol 186 LDL 108.  Diabetes mellitus - hemoglobin A1c 10.4. Goal less than 7  Hypoglycemic on admission with glucose in the 40s.  Hypertension  Chronic renal insufficiency  Anemia    Hospital day # 1  TREATMENT/PLAN  Continue clopidogrel 75  mg orally every day for secondary stroke prevention.  Await MRI/MRA.  Await therapy evaluations  Risk factor modification. He did better glucose control   Delton See PA-C [facility] Pager [phone] 03/21/2014, 9:55 AM  I have personally obtained a history, examined the patient, evaluated imaging results, and formulated the assessment and plan of care. I agree with the above.  York Spaniel   To contact Stroke Continuity provider, please refer to [email or web address]. After hours, contact General Neurology

## 2014-03-21 NOTE — Evaluation (Signed)
Clinical/Bedside Swallow Evaluation Patient Details  Name: [name] FullingBetty J Nordell MRN: [id number] Date of Birth: 03-Jan-1941  Today's Date: 03/21/2014 Time: 0940-1000 SLP Time Calculation (min): 20 min  Past Medical History:  Past Medical History  Diagnosis Date  . Hypertension   . Diabetes mellitus   . Stroke   . Chronic renal insufficiency   . Hyperlipidemia   . Osteopenia   . CVA (cerebral infarction)    Past Surgical History:  Past Surgical History  Procedure Laterality Date  . Eye surgery     HPI:  [name] [name] is a 73 y.o. African American female with history of hypertension, diabetes, CVA, chronic kidney disease stage III, hyperlipidemia, and osteopenia who presents with the above complaints.  She is a poor historian at this time, but most of the history was obtained from the patient.  Apparently at 9 p.m. yesterday, patient was trying to eat with her right hand as she could not use her fork to eat.  She also described left-sided weakness with "swelling on left side."  She was brought to the emergency department as code stroke.  Patient in the emergency department was found to have had multiple episodes of hypoglycemia and has required at least 3 amps of D50 to bring her blood sugars up.  Patient not given TPA due to recent stroke in March of 2015.  Patient was evaluated by neurology.  Hospitalist service was asked to admit the patient for further care and management.  She denies any recent fevers, chills, chest pain, shortness of breath, abdominal pain, diarrhea, headaches or vision changes.  Patient had slurred speech which improved after she received D50.  CT scan revealed no evidence of an acute ischemic infarction. There are old lacunar infarctions in both cerebral hemispheres, and there is decreased density in the deep white matter of both cerebral hemispheres consistent with chronic small vessel ischemia.  There is no acute intracranial hemorrhage. There are stable basal ganglia  calcifications bilaterally.  MRA ordered, but not completed.    Assessment / Plan / Recommendation Clinical Impression   Pt with s/s of aspiration with thin via straw with delayed throat clearing noted; thin via cup with small amounts with no s/s of aspiration noted; oral strength and coordination decreased on left side with oral spillage noted and minimally decreased left lingual/labial ROM/symmetry with oral motor tasks; decreased sensation/strength noted with oral residue evident; as well as, decreased (slow but thorough) mastication with solids.  Pt was able to clear residue with liquid wash.      Aspiration Risk  Mild    Diet Recommendation Dysphagia 3 (Mechanical Soft);Thin liquid (no straws)   Liquid Administration via: Cup;No straw Medication Administration: Whole meds with puree Supervision: Intermittent supervision to cue for compensatory strategies Compensations: Slow rate;Small sips/bites Postural Changes and/or Swallow Maneuvers: Seated upright 90 degrees;Upright 30-60 min after meal    Other  Recommendations Oral Care Recommendations: Oral care BID   Follow Up Recommendations  Inpatient Rehab    Frequency and Duration min 2x/week  1 week   Pertinent Vitals/Pain WDL    SLP Swallow Goals  see care plan   Swallow Study Prior Functional Status   Independent at home    General Date of Onset: 03/20/14 HPI: [name] FullingBetty J Hinchey is a 73 y.o. African American female with history of hypertension, diabetes, CVA, chronic kidney disease stage III, hyperlipidemia, and osteopenia who presents with the above complaints.  She is a poor historian at this time, but most of the  history was obtained from the patient.  Apparently at 9 p.m. yesterday, patient was trying to eat with her right hand as she could not use her fork to eat.  She also described left-sided weakness with "swelling on left side."  She was brought to the emergency department as code stroke.  Patient in the emergency  department was found to have had multiple episodes of hypoglycemia and has required at least 3 amps of D50 to bring her blood sugars up.  Patient not given TPA due to recent stroke in March of 2015.  Patient was evaluated by neurology.  Hospitalist service was asked to admit the patient for further care and management.  She denies any recent fevers, chills, chest pain, shortness of breath, abdominal pain, diarrhea, headaches or vision changes.  Patient had slurred speech which improved after she received D50 during my interview of the patient. Type of Study: Bedside swallow evaluation Diet Prior to this Study: NPO Temperature Spikes Noted: No Respiratory Status: Room air Behavior/Cognition: Alert;Cooperative Oral Cavity - Dentition: Dentures, not available;Edentulous Self-Feeding Abilities: Able to feed self Patient Positioning: Upright in bed Baseline Vocal Quality: Clear;Low vocal intensity Volitional Cough: Weak Volitional Swallow: Able to elicit    Oral/Motor/Sensory Function Overall Oral Motor/Sensory Function: Impaired Labial ROM: Reduced left Labial Symmetry: Abnormal symmetry left Labial Strength: Reduced Labial Sensation: Reduced Lingual ROM: Reduced left Lingual Strength: Reduced Lingual Sensation: Reduced Facial ROM: Reduced left Facial Symmetry: Left droop (minimal) Facial Strength: Reduced Facial Sensation: Reduced Velum: Within Functional Limits Mandible: Within Functional Limits   Ice Chips Ice chips: Not tested   Thin Liquid Thin Liquid: Impaired Presentation: Straw;Cup Oral Phase Impairments: Reduced labial seal Oral Phase Functional Implications: Left anterior spillage Pharyngeal  Phase Impairments: Throat Clearing - Delayed (with straw sips)    Nectar Thick Nectar Thick Liquid: Not tested   Honey Thick Honey Thick Liquid: Not tested   Puree Puree: Within functional limits Presentation: Spoon   Solid       Solid: Impaired Presentation: Self Fed Oral Phase  Impairments: Reduced lingual movement/coordination;Impaired mastication Oral Phase Functional Implications: Oral residue       Rod MaePatricia A Abbey Veith, M.S., CCC-SLP 03/21/2014,10:55 AM

## 2014-03-22 LAB — GLUCOSE, CAPILLARY
GLUCOSE-CAPILLARY: 276 mg/dL — AB (ref 70–99)
Glucose-Capillary: 223 mg/dL — ABNORMAL HIGH (ref 70–99)
Glucose-Capillary: 283 mg/dL — ABNORMAL HIGH (ref 70–99)
Glucose-Capillary: 147 mg/dL — ABNORMAL HIGH (ref 70–99)

## 2014-03-22 LAB — BASIC METABOLIC PANEL
BUN: 18 mg/dL (ref 6–23)
CALCIUM: 9 mg/dL (ref 8.4–10.5)
CO2: 28 mEq/L (ref 19–32)
CREATININE: 1.08 mg/dL (ref 0.50–1.10)
Chloride: 101 mEq/L (ref 96–112)
GFR calc Af Amer: 57 mL/min — ABNORMAL LOW (ref >90)
GFR, EST NON AFRICAN AMERICAN: 49 mL/min — AB (ref >90)
Glucose, Bld: 249 mg/dL — ABNORMAL HIGH (ref 70–99)
Potassium: 4 mEq/L (ref 3.7–5.3)
Sodium: 141 mEq/L (ref 137–147)

## 2014-03-22 LAB — URINE CULTURE

## 2014-03-22 LAB — CORTISOL: Cortisol, Plasma: 4.2 ug/dL

## 2014-03-22 LAB — HEMOGLOBIN A1C
HEMOGLOBIN A1C: 10.1 % — AB (ref <5.7)
Mean Plasma Glucose: 243 mg/dL — ABNORMAL HIGH (ref <117)

## 2014-03-22 MED ORDER — BENAZEPRIL HCL 10 MG PO TABS
10.0000 mg | ORAL_TABLET | Freq: Every day | ORAL | Status: DC
  Administered 2014-03-22 – 2014-03-24 (×3): 10 mg via ORAL
  Filled 2014-03-22 (×3): qty 1

## 2014-03-22 MED ORDER — FLUTICASONE PROPIONATE 50 MCG/ACT NA SUSP
1.0000 | Freq: Every day | NASAL | Status: DC
  Administered 2014-03-22 – 2014-03-24 (×3): 1 via NASAL
  Filled 2014-03-22: qty 16

## 2014-03-22 MED ORDER — LORATADINE 10 MG PO TABS
10.0000 mg | ORAL_TABLET | Freq: Every day | ORAL | Status: DC
  Administered 2014-03-22 – 2014-03-24 (×3): 10 mg via ORAL
  Filled 2014-03-22 (×3): qty 1

## 2014-03-22 MED ORDER — ALBUTEROL SULFATE (2.5 MG/3ML) 0.083% IN NEBU: 2.5000 mg | INHALATION_SOLUTION | RESPIRATORY_TRACT | Status: DC | PRN

## 2014-03-22 MED ORDER — ACETAMINOPHEN 325 MG PO TABS
650.0000 mg | ORAL_TABLET | Freq: Four times a day (QID) | ORAL | Status: DC | PRN
  Administered 2014-03-22 (×2): 650 mg via ORAL
  Filled 2014-03-22 (×2): qty 2

## 2014-03-22 MED ORDER — INSULIN ASPART PROT & ASPART (70-30 MIX) 100 UNIT/ML ~~LOC~~ SUSP
15.0000 [IU] | Freq: Two times a day (BID) | SUBCUTANEOUS | Status: DC
  Administered 2014-03-22 – 2014-03-24 (×4): 15 [IU] via SUBCUTANEOUS
  Filled 2014-03-22: qty 10

## 2014-03-22 NOTE — Progress Notes (Addendum)
PATIENT DETAILS Name: [name] [name] Age: 73 y.o. Sex: female Date of Birth: 1941/04/13 Admit Date: 03/20/2014 Admitting Physician Cristal Ford, MD OZH:[id number],ONGEXBMW R., MD  Subjective: No major complaints overnight  Assessment/Plan: Principal Problem:  Acute CVA (cerebral infarction) - Admitted with acute on chronic left-sided weakness, although hypoglycemic, continued to have persistent left-sided weakness even following correction of hypoglycemia. MRI brain on 5/16 confirmed acute CVA. Neurology consulted, currently on Plavix. Sinus rhythm on telemetry monitor. Recent echocardiogram on 3/7 showed EF around 60-65%, recent carotid Doppler on 3/8 showed 1-10 9% ICA stenosis. LDL 108, hemoglobin A1c 9.9. Seen by physical therapy, current recommendations are for CIR evaluation. On exam, continues to have mild left-sided weakness. Await further neurology recommendations, patient may need a TEE.    Hypertension - Continue benazepril and metoprolol, we'll continue to follow BP trend and adjust medications accordingly.    Hyperlipidemia - Continue with statins. Does of Lipitor increased to 40 mg as an LDL still more than 100     DM type 2, uncontrolled - CBGs stable- continue SSI. Resume insulin 70/3015 units twice a day. Resume metformin on discharge.    Hypoglycemia - Resolved. - Continue to monitor CBGs closely while on insulin  ARF on Chronic kidney disease stage III  -resolved, creatinine back to baseline  Hypothyroidism - Continue with levothyroxine  Disposition: Remain inpatient  DVT Prophylaxis: Prophylactic Lovenox  Code Status: Full code  Family Communication None at bedside  Procedures:  None  CONSULTS:  neurology  MEDICATIONS: Scheduled Meds: . atorvastatin  40 mg Oral q1800  . benazepril  10 mg Oral Daily  . cholecalciferol  2,000 Units Oral Daily  . clopidogrel  75 mg Oral Q breakfast  . enoxaparin (LOVENOX) injection  40 mg  Subcutaneous Daily  . fluticasone  1 spray Each Nare Daily  . gabapentin  300 mg Oral BID  . hydrochlorothiazide  25 mg Oral Daily  . insulin aspart  0-9 Units Subcutaneous TID WC  . levothyroxine  50 mcg Oral QAC breakfast  . loratadine  10 mg Oral Daily  . metoprolol succinate  25 mg Oral BID  . pantoprazole  80 mg Oral Daily  . pneumococcal 23 valent vaccine  0.5 mL Intramuscular Tomorrow-1000   Continuous Infusions:  PRN Meds:.acetaminophen, albuterol, diphenhydrAMINE, hydrALAZINE, senna-docusate  Antibiotics: Anti-infectives   None       PHYSICAL EXAM: Vital signs in last 24 hours: Filed Vitals:   03/21/14 2219 03/22/14 0103 03/22/14 0531 03/22/14 0900  BP: 180/63 168/77 180/76 163/76  Pulse: 67 73 74 72  Temp: 98.4 F (36.9 C) 98.7 F (37.1 C) 98 F (36.7 C) 98.2 F (36.8 C)  TempSrc: Oral Oral Oral Oral  Resp: 20 20 20 20   SpO2: 96% 97% 97% 99%    Weight change:  There were no vitals filed for this visit. There is no weight on file to calculate BMI.   Gen Exam: Awake and alert with clear speech.   Neck: Supple, No JVD.   Chest: B/L Clear.   CVS: S1 S2 Regular, no murmurs.  Abdomen: soft, BS +, non tender, non distended.  Extremities: no edema, lower extremities warm to touch. Neurologic: Mild left-sided weakness. Skin: No Rash.   Wounds: N/A.    Intake/Output from previous day:  Intake/Output Summary (Last 24 hours) at 03/22/14 1235 Last data filed at 03/22/14 0857  Gross per 24 hour  Intake    240 ml  Output  0 ml  Net    240 ml     LAB RESULTS: CBC  Recent Labs Lab 03/20/14 2156 03/21/14 0440  WBC 6.4 5.7  HGB 11.9* 10.4*  HCT 34.3* 30.7*  PLT 256 242  MCV 88.2 87.7  MCH 30.6 29.7  MCHC 34.7 33.9  RDW 12.4 12.6  LYMPHSABS 2.5  --   MONOABS 0.7  --   EOSABS 0.1  --   BASOSABS 0.0  --     Chemistries   Recent Labs Lab 03/20/14 2156 03/21/14 0440 03/22/14 0657  NA 141 142 141  K 3.8 3.5* 4.0  CL 99 101 101  CO2 30  31 28   GLUCOSE 42* 47* 249*  BUN 25* 26* 18  CREATININE 1.48* 1.35* 1.08  CALCIUM 9.5 8.7 9.0    CBG:  Recent Labs Lab 03/21/14 1435 03/21/14 1658 03/21/14 2125 03/22/14 0639 03/22/14 1159  GLUCAP 267* 253* 193* 223* 276*    GFR The CrCl is unknown because both a height and weight (above a minimum accepted value) are required for this calculation.  Coagulation profile  Recent Labs Lab 03/20/14 2156  INR 1.03    Cardiac Enzymes No results found for this basename: CK, CKMB, TROPONINI, MYOGLOBIN,  in the last 168 hours  No components found with this basename: POCBNP,  No results found for this basename: DDIMER,  in the last 72 hours  Recent Labs  03/21/14 0440  HGBA1C 9.9*    Recent Labs  03/21/14 0440  CHOL 186  HDL 48  LDLCALC 108*  TRIG 150*  CHOLHDL 3.9    Recent Labs  03/21/14 2238  TSH 0.869   No results found for this basename: VITAMINB12, FOLATE, FERRITIN, TIBC, IRON, RETICCTPCT,  in the last 72 hours No results found for this basename: LIPASE, AMYLASE,  in the last 72 hours  Urine Studies No results found for this basename: UACOL, UAPR, USPG, UPH, UTP, UGL, UKET, UBIL, UHGB, UNIT, UROB, ULEU, UEPI, UWBC, URBC, UBAC, CAST, CRYS, UCOM, BILUA,  in the last 72 hours  MICROBIOLOGY: Recent Results (from the past 240 hour(s))  URINE CULTURE     Status: None   Collection Time    03/20/14 10:36 PM      Result Value Ref Range Status   Specimen Description URINE, CLEAN CATCH   Final   Special Requests NONE   Final   Culture  Setup Time     Final   Value: 03/21/2014 04:12     Performed at [facility]   Colony Count     Final   Value: 30,000 COLONIES/ML     Performed at [facility]   Culture     Final   Value: Multiple bacterial morphotypes present, none predominant. Suggest appropriate recollection if clinically indicated.     Performed at [facility]   Report Status 03/22/2014 FINAL   Final    RADIOLOGY  STUDIES/RESULTS: Dg Chest 2 View  03/22/2014   CLINICAL DATA:  Stroke symptoms with history of hypertension and diabetes  EXAM: CHEST  2 VIEW  COMPARISON:  DG CHEST 2 VIEW dated 01/10/2014  FINDINGS: The lungs are adequately inflated and clear. The cardiopericardial silhouette is normal in size. The pulmonary vascularity is not engorged. The mediastinum is normal in width. There is no pleural effusion. The observed portions of the bony thorax appear normal.  IMPRESSION: There is no evidence of pneumonia nor CHF. Mild hyperinflation may be voluntary or could reflect underlying COPD.  Electronically Signed   By: David  [place]   On: 03/22/2014 00:54   Ct Head (brain) Wo Contrast  03/20/2014   CLINICAL DATA:  Left-sided weakness  EXAM: CT HEAD WITHOUT CONTRAST  TECHNIQUE: Contiguous axial images were obtained from the base of the skull through the vertex without intravenous contrast.  COMPARISON:  CT HEAD W/O CM dated 01/14/2014; MR HEAD W/O CM dated 01/09/2014  FINDINGS: There is mild diffuse cerebral and cerebellar atrophy with compensatory ventriculomegaly. These findings are stable. There is decreased density in the deep white matter of both cerebral hemispheres consistent with chronic small vessel ischemic type change. There are old lacunar infarctions in the basal ganglia bilaterally. There is no evidence of an acute intracranial hemorrhage. There is no objective evidence of an acute evolving ischemic infarction. There are basal ganglia calcifications bilaterally which are stable. The cerebellum and brainstem are normal in density.  At bone window settings the observed portions of paranasal sinuses and mastoid air cells are clear. There is no evidence of an acute skull fracture.  IMPRESSION: 1. There is no evidence of an acute ischemic infarction. There are old lacunar infarctions in both cerebral hemispheres, and there is decreased density in the deep white matter of both cerebral hemispheres consistent with  chronic small vessel ischemia. 2. There is no acute intracranial hemorrhage. 3. There are stable basal ganglia calcifications bilaterally. 4. These results were called by telephone at the time of interpretation on 03/20/2014 at 10:07 PM to Dr. Roseanne Reno, who verbally acknowledged these results.   Electronically Signed   By: David  [place]   On: 03/20/2014 22:09   Mr Brain Wo Contrast  03/22/2014   CLINICAL DATA:  Evaluate stroke, hypoglycemia, intermittent slurred speech.  EXAM: MRI HEAD WITHOUT CONTRAST  MRA HEAD WITHOUT CONTRAST  TECHNIQUE: Multiplanar, multiecho pulse sequences of the brain and surrounding structures were obtained without intravenous contrast. Angiographic images of the head were obtained using MRA technique without contrast.  COMPARISON:  None.  MR HEAD W/O CM dated 01/09/2014; CT HEAD W/O CM dated 03/20/2014; CT HEAD W/O CM dated 01/14/2014  FINDINGS: MRI HEAD FINDINGS  Punctate focus of reduced diffusion within the left mesial temporal lobe, axial 14/60. Corresponding low ADC values. 7 mm focus of reduced diffusion within the left periventricular frontal white matter, axial 20/60 with corresponding low ADC values. 3 mm focus of reduced diffusion within the left superior cerebellum, with corresponding low ADC values.  Moderately motion degraded gradient sequence, with scattered subcentimeter foci of susceptibility artifact within the supra and infratentorial brain, better seen on prior less motion degraded MRI. No convincing evidence of new micro hemorrhages. No midline shift or mass effect.  Moderate ventriculomegaly, likely on the basis of global parenchymal brain volume loss as there is overall commensurate enlargement cerebral sulci and cerebellar folia. Severe white matter changes, including pontine T2 hyperintensities are similar. Remote bilateral basal ganglia, thalamic and cerebellar infarcts. No midline shift or mass effect.  No abnormal extra-axial fluid collections. Status post bilateral  ocular lens implants. Patient is edentulous. No abnormal sellar expansion. No cerebellar tonsillar ectopia.  MRA HEAD FINDINGS  Anterior circulation: Mildly motion degraded evaluation limits the sensitivity.  Anterior circulation: Normal flow related enhancement of the included cervical, petrous, cavernous internal carotid arteries. However, suspected at least 50% stenosis of the left anterior genu of the internal carotid artery corresponding to coarse calcification on prior head CT. Finding may be accentuated by patient motion. Normal flow related enhancement of the supra clinoid internal  carotid arteries, the right is more robust. Patent flow related enhancement of the anterior and middle cerebral arteries. At least 50% narrowing of the origin of a left M2 branch similar to worse.  Posterior circulation: Motion degrades evaluation. Thready flow of the left vertebral artery throughout the intradural segment, similar to prior examination. Basilar artery is patent, patent main branch vessels. Posterior communicating arteries are not distinctly identified. Focal high-grade stenosis of the right P2 segment, axial 97/160. Normal flow within the enhancement of the P3 segments.  No convincing evidence of aneurysm. No large vessel occlusion. Mild irregularity of the intracranial vessels diffusely most consistent with atherosclerosis.  IMPRESSION: MRI head: Left mesial temporal lobe, left periventricular frontal white matter and left superior cerebellar subcentimeter foci of acute ischemia. These span multiple vascular territories, and may reflect thromboembolic disease without acute large territory infarct.  Moderately motion degraded examination. Scattered susceptibility artifact most consistent with sequelae of chronic hypertension, with multiple remote basal ganglia/thalamic and cerebellar infarcts. Severe white matter changes suggest chronic small vessel ischemic disease.  MRA head: Motion degraded examination.  Multifocal intracranial mid to high-grade stenosis, including anterior genu of the left internal carotid artery, left M2, right P2 and left distal vertebral arteries, relatively unchanged for this motion degraded examination.  Diffuse mild intracranial luminal irregularity compatible with atherosclerosis.   Electronically Signed   By: Awilda Metroourtnay  Blo[place]   On: 03/22/2014 01:04   Mr Maxine GlennMra Head/brain Wo Cm  03/22/2014   CLINICAL DATA:  Evaluate stroke, hypoglycemia, intermittent slurred speech.  EXAM: MRI HEAD WITHOUT CONTRAST  MRA HEAD WITHOUT CONTRAST  TECHNIQUE: Multiplanar, multiecho pulse sequences of the brain and surrounding structures were obtained without intravenous contrast. Angiographic images of the head were obtained using MRA technique without contrast.  COMPARISON:  None.  MR HEAD W/O CM dated 01/09/2014; CT HEAD W/O CM dated 03/20/2014; CT HEAD W/O CM dated 01/14/2014  FINDINGS: MRI HEAD FINDINGS  Punctate focus of reduced diffusion within the left mesial temporal lobe, axial 14/60. Corresponding low ADC values. 7 mm focus of reduced diffusion within the left periventricular frontal white matter, axial 20/60 with corresponding low ADC values. 3 mm focus of reduced diffusion within the left superior cerebellum, with corresponding low ADC values.  Moderately motion degraded gradient sequence, with scattered subcentimeter foci of susceptibility artifact within the supra and infratentorial brain, better seen on prior less motion degraded MRI. No convincing evidence of new micro hemorrhages. No midline shift or mass effect.  Moderate ventriculomegaly, likely on the basis of global parenchymal brain volume loss as there is overall commensurate enlargement cerebral sulci and cerebellar folia. Severe white matter changes, including pontine T2 hyperintensities are similar. Remote bilateral basal ganglia, thalamic and cerebellar infarcts. No midline shift or mass effect.  No abnormal extra-axial fluid collections.  Status post bilateral ocular lens implants. Patient is edentulous. No abnormal sellar expansion. No cerebellar tonsillar ectopia.  MRA HEAD FINDINGS  Anterior circulation: Mildly motion degraded evaluation limits the sensitivity.  Anterior circulation: Normal flow related enhancement of the included cervical, petrous, cavernous internal carotid arteries. However, suspected at least 50% stenosis of the left anterior genu of the internal carotid artery corresponding to coarse calcification on prior head CT. Finding may be accentuated by patient motion. Normal flow related enhancement of the supra clinoid internal carotid arteries, the right is more robust. Patent flow related enhancement of the anterior and middle cerebral arteries. At least 50% narrowing of the origin of a left M2 branch similar to worse.  Posterior  circulation: Motion degrades evaluation. Thready flow of the left vertebral artery throughout the intradural segment, similar to prior examination. Basilar artery is patent, patent main branch vessels. Posterior communicating arteries are not distinctly identified. Focal high-grade stenosis of the right P2 segment, axial 97/160. Normal flow within the enhancement of the P3 segments.  No convincing evidence of aneurysm. No large vessel occlusion. Mild irregularity of the intracranial vessels diffusely most consistent with atherosclerosis.  IMPRESSION: MRI head: Left mesial temporal lobe, left periventricular frontal white matter and left superior cerebellar subcentimeter foci of acute ischemia. These span multiple vascular territories, and may reflect thromboembolic disease without acute large territory infarct.  Moderately motion degraded examination. Scattered susceptibility artifact most consistent with sequelae of chronic hypertension, with multiple remote basal ganglia/thalamic and cerebellar infarcts. Severe white matter changes suggest chronic small vessel ischemic disease.  MRA head: Motion  degraded examination. Multifocal intracranial mid to high-grade stenosis, including anterior genu of the left internal carotid artery, left M2, right P2 and left distal vertebral arteries, relatively unchanged for this motion degraded examination.  Diffuse mild intracranial luminal irregularity compatible with atherosclerosis.   Electronically Signed   By: Awilda Metro   On: 03/22/2014 01:04    Jahel Wavra Levora Dredge, MD  [facility] Pager:[phone]  If 7PM-7AM, please contact night-coverage [email or web address] Password [email or web address] 03/22/2014, 12:35 PM   LOS: 2 days   **Disclaimer: This note may have been dictated with voice recognition software. Similar sounding words can inadvertently be transcribed and this note may contain transcription errors which may not have been corrected upon publication of note.**

## 2014-03-22 NOTE — Progress Notes (Signed)
SLP Cancellation Note  Patient Details Name: [name] FullingBetty J [name] MRN: [id number] DOB: September 30, 1941   Cancelled treatment:  ST to complete SLE 03/23/14. Moreen FowlerKaren Megham Dwyer M.S, CCC-SLP [phone] Lacinda AxonKaren H Khloie Hamada 03/22/2014, 2:17 PM

## 2014-03-22 NOTE — Evaluation (Signed)
Physical Therapy Evaluation Patient Details Name: [name] [name] MRN: [id number] DOB: [birth year] Today's Date: 03/22/2014   History of Present Illness    [name] [name] is a 73 y.o. female with a history of hypertension, hyperlipidemia, diabetes mellitus and stroke in March 2015, presenting with exacerbation of left-sided weakness as well as slurred speech. Onset was at 9 PM on 03/20/2014. She was also noted to be hypoglycemic with blood sugar 42. She was given one amp of D50. Repeat blood sugar was 148. Weakness of the left side was persistent, particularly involving her left leg. CT scan of her head showed no acute intracranial abnormality. Patient has been on Plavix for antiplatelet therapy. NIH stroke score was 4.    Clinical Impression  Pt presents with moderate limitations to functional mobility due to muscular weakness, deconditioning, and impaired cognition.  Pt with good tolerance to activity and seems motivated to improve.  BP noted to be elevated at 189/66 sitting pre-eval and 223/69 sitting post-activity.  Reported to PA and RN.  Recommend CIR consult to assess for potential admission to maximize functional independence, assist with glucose control and minimize risk for fall-related injury or rehospitalization.  Plan to being acute level PT and assist with d/c as needed.  Thanks.    Follow Up Recommendations CIR;Supervision/Assistance - 24 hour    Equipment Recommendations  None recommended by PT    Recommendations for Other Services Rehab consult     Precautions / Restrictions Precautions Precautions: Fall Precaution Comments: chair pad when up      Mobility  Bed Mobility Overal bed mobility: Needs Assistance Bed Mobility: Supine to Sit     Supine to sit: Supervision;HOB elevated     General bed mobility comments: HOB ~20 degree and pt uses rail, incr time, able to advance to EOB without assist but slighly impulsive  Transfers Overall transfer level: Needs  assistance Equipment used: Rolling walker (2 wheeled) Transfers: Sit to/from Stand Sit to Stand: Min assist         General transfer comment: needs cues for safest hands on surface not RW and instruction due to mildly impulsive  Ambulation/Gait Ambulation/Gait assistance: Min assist Ambulation Distance (Feet): 10 Feet Assistive device: Rolling walker (2 wheeled) Gait Pattern/deviations: Step-through pattern;Decreased stride length;Shuffle;Trunk flexed Gait velocity: very slow, not timed this assess, limited space Gait velocity interpretation: <1.8 ft/sec, indicative of risk for recurrent falls    Stairs            Wheelchair Mobility    Modified Rankin (Stroke Patients Only) Modified Rankin (Stroke Patients Only) Pre-Morbid Rankin Score: Moderately severe disability Modified Rankin: Moderately severe disability     Balance Overall balance assessment: Needs assistance;History of Falls (fell out of chair 8 wks ago per report) Sitting-balance support: Feet supported Sitting balance-Leahy Scale: Fair     Standing balance support: Bilateral upper extremity supported Standing balance-Leahy Scale: Poor                               Pertinent Vitals/Pain BP noted above in impression statement    Home Living Family/patient expects to be discharged to:: Private residence Living Arrangements: Spouse/significant other Available Help at Discharge: Family;Available 24 hours/day Type of Home: House Home Access: Ramped entrance     Home Layout: One level Home Equipment: Walker - 2 wheels;Shower seat;Electric scooter      Prior Function Level of Independence: Needs assistance   Gait / Transfers Assistance Needed:  Assist to transfer and for ambulation with RW  ADL's / Homemaking Assistance Needed: Assist for bathing/dressing and meal prep. Pt able to don shorts/undewear.  Comments: Patient legally blind     Hand Dominance   Dominant Hand: Right     Extremity/Trunk Assessment   Upper Extremity Assessment: Generalized weakness;Defer to OT evaluation           Lower Extremity Assessment: Generalized weakness  Pt reports numbness, tingling entire legs, maybe left worse than right but difficult to assess due to cognition.    Cervical / Trunk Assessment: Normal  Communication   Communication: No difficulties  Cognition Arousal/Alertness: Lethargic (arousable, able to maintain alert throughout assessment) Behavior During Therapy: WFL for tasks assessed/performed Overall Cognitive Status: Impaired/Different from baseline Area of Impairment: Orientation Orientation Level: Disoriented to;Time;Place (when asked, knows in hospital, but conversationally disorien)             General Comments: as noted, able to state time/place when asked but in conversation, gets confused about instructions for now in hospital vs. when in home (ie call for help to get back to bed NOW with call bell for nurse, etc)    General Comments      Exercises        Assessment/Plan    PT Assessment Patient needs continued PT services  PT Diagnosis Generalized weakness   PT Problem List Obesity;Impaired sensation;Cardiopulmonary status limiting activity;Decreased knowledge of precautions;Decreased safety awareness;Decreased knowledge of use of DME;Decreased cognition;Decreased mobility;Decreased balance;Decreased strength  PT Treatment Interventions Patient/family education;Neuromuscular re-education;Balance training;Therapeutic exercise;Therapeutic activities;Functional mobility training;Gait training;DME instruction   PT Goals (Current goals can be found in the Care Plan section) Acute Rehab PT Goals Patient Stated Goal: move more, sit less, not need so much help PT Goal Formulation: With patient/family Time For Goal Achievement: 04/05/14 Potential to Achieve Goals: Fair    Frequency Min 4X/week   Barriers to discharge        Co-evaluation                End of Session Equipment Utilized During Treatment: Gait belt Activity Tolerance: Patient tolerated treatment well Patient left: in chair;with call bell/phone within reach;with chair alarm set;with family/visitor present Nurse Communication: Mobility status;Other (comment) (BP 223/69 sitting after activity)         Time: 1100-1140 PT Time Calculation (min): 40 min   Charges:   PT Evaluation $Initial PT Evaluation Tier I: 1 Procedure PT Treatments $Therapeutic Activity: 8-22 mins   PT G Codes:          Dennis BastJennifer Galloway Capitola Ladson 03/22/2014, 11:41 AM

## 2014-03-22 NOTE — Plan of Care (Signed)
Problem: Acute Rehab PT Goals(only PT should resolve) Goal: Pt Will Verbalize and Adhere to Precautions While PT Will Verbalize and Adhere to Precautions While Performing Mobility Pt will call for assistance when getting up or changing surfaces in order to minimize risk for fall.

## 2014-03-22 NOTE — Progress Notes (Signed)
Stroke Team Progress Note  HISTORY [name] [name] is a 73 y.o. female with a history of hypertension, hyperlipidemia, diabetes mellitus and stroke in March 2015, presenting with exacerbation of left-sided weakness as well as slurred speech. Onset was at 9 PM on 03/20/2014. She was also noted to be hypoglycemic with blood sugar 42. She was given one amp of D50. Repeat blood sugar was 148. Weakness of the left side was persistent, particularly involving her left leg. CT scan of her head showed no acute intracranial abnormality. Patient has been on Plavix for antiplatelet therapy. NIH stroke score was 4.    LSN: 9 PM on 03/20/2014  tPA Given: No: Recent stroke (01/09/2014).  MRankin: 2    SUBJECTIVE The patient's husband is at the bedside. The patient is less lethargic today. She follows commands better.  OBJECTIVE Most recent Vital Signs: Filed Vitals:   03/21/14 2219 03/22/14 0103 03/22/14 0531 03/22/14 0900  BP: 180/63 168/77 180/76 163/76  Pulse: 67 73 74 72  Temp: 98.4 F (36.9 C) 98.7 F (37.1 C) 98 F (36.7 C) 98.2 F (36.8 C)  TempSrc: Oral Oral Oral Oral  Resp: 20 20 20 20   SpO2: 96% 97% 97% 99%   CBG (last 3)   Recent Labs  03/21/14 1658 03/21/14 2125 03/22/14 0639  GLUCAP 253* 193* 223*    IV Fluid Intake:      MEDICATIONS  . atorvastatin  40 mg Oral q1800  . cholecalciferol  2,000 Units Oral Daily  . clopidogrel  75 mg Oral Q breakfast  . enoxaparin (LOVENOX) injection  40 mg Subcutaneous Daily  . gabapentin  300 mg Oral BID  . hydrochlorothiazide  25 mg Oral Daily  . insulin aspart  0-9 Units Subcutaneous TID WC  . levothyroxine  50 mcg Oral QAC breakfast  . metoprolol succinate  25 mg Oral BID  . pantoprazole  80 mg Oral Daily  . pneumococcal 23 valent vaccine  0.5 mL Intramuscular Tomorrow-1000   PRN:  diphenhydrAMINE, hydrALAZINE, senna-docusate  Diet:  Dysphagia 3  Activity:  Up with assistance DVT Prophylaxis:  Lovenox  CLINICALLY SIGNIFICANT  STUDIES Basic Metabolic Panel:   Recent Labs Lab 03/21/14 0440 03/22/14 0657  NA 142 141  K 3.5* 4.0  CL 101 101  CO2 31 28  GLUCOSE 47* 249*  BUN 26* 18  CREATININE 1.35* 1.08  CALCIUM 8.7 9.0   Liver Function Tests:   Recent Labs Lab 03/20/14 2156  AST 15  ALT 7  ALKPHOS 62  BILITOT <0.2*  PROT 7.4  ALBUMIN 3.6   CBC:   Recent Labs Lab 03/20/14 2156 03/21/14 0440  WBC 6.4 5.7  NEUTROABS 3.2  --   HGB 11.9* 10.4*  HCT 34.3* 30.7*  MCV 88.2 87.7  PLT 256 242   Coagulation:   Recent Labs Lab 03/20/14 2156  LABPROT 13.3  INR 1.03   Cardiac Enzymes: No results found for this basename: CKTOTAL, CKMB, CKMBINDEX, TROPONINI,  in the last 168 hours Urinalysis:   Recent Labs Lab 03/20/14 2236  COLORURINE YELLOW  LABSPEC 1.009  PHURINE 6.5  GLUCOSEU 250*  HGBUR NEGATIVE  BILIRUBINUR NEGATIVE  KETONESUR NEGATIVE  PROTEINUR 30*  UROBILINOGEN 0.2  NITRITE NEGATIVE  LEUKOCYTESUR NEGATIVE   Lipid Panel    Component Value Date/Time   CHOL 186 03/21/2014 0440   TRIG 150* 03/21/2014 0440   HDL 48 03/21/2014 0440   CHOLHDL 3.9 03/21/2014 0440   VLDL 30 03/21/2014 0440   LDLCALC 108* 03/21/2014  0440   HgbA1C  Lab Results  Component Value Date   HGBA1C 9.9* 03/21/2014    Urine Drug Screen:     Component Value Date/Time   LABOPIA NONE DETECTED 01/09/2014 2054   COCAINSCRNUR NONE DETECTED 01/09/2014 2054   LABBENZ NONE DETECTED 01/09/2014 2054   AMPHETMU NONE DETECTED 01/09/2014 2054   THCU NONE DETECTED 01/09/2014 2054   LABBARB NONE DETECTED 01/09/2014 2054    Alcohol Level: No results found for this basename: ETH,  in the last 168 hours  Ct Head (brain) Wo Contrast 03/20/2014    1. There is no evidence of an acute ischemic infarction. There are old lacunar infarctions in both cerebral hemispheres, and there is decreased density in the deep white matter of both cerebral hemispheres consistent with chronic small vessel ischemia.  2. There is no acute  intracranial hemorrhage.  3. There are stable basal ganglia calcifications bilaterally.     MRI / MRA of the brain    MRI head  Left mesial temporal lobe, left periventricular frontal white matter and left superior cerebellar subcentimeter foci of acute ischemia. These span multiple vascular territories, and may reflect thromboembolic disease without acute large territory infarct.  Moderately motion degraded examination. Scattered susceptibility artifact most consistent with sequelae of chronic hypertension, with multiple remote basal ganglia/thalamic and cerebellar infarcts.Severe white matter changes suggest chronic small vessel ischemic disease.  MRA head:  Motion degraded examination. Multifocal intracranial mid to high-grade stenosis, including anterior genu of the left internal carotid artery, left M2, right P2 and left distal vertebral arteries, relatively unchanged for this motion degraded examination. Diffuse mild intracranial luminal irregularity compatible with atherosclerosis.   2D Echocardiogram  01/10/2014 ejection fraction 60-65%. No cardiac source of emboli identified.  Carotid Doppler 01/11/2014  Right: Mild focal calcific plaque distal CCA and origin ICA. Left: Moderate calcific plaque with acoustic shadowing origin ICA.  Bilateral: 1-39% ICA stensois. Vertebral artery flow is antegrade.   CXR  There is no evidence of pneumonia nor CHF. Mild hyperinflation may be voluntary or could reflect underlying COPD.   EKG  Sinus rhythm rate 74 beats per minute. For complete results please see formal report.   Therapy Recommendations rehabilitation consult recommend for inpatient rehabilitation  Physical Exam   Neurologic Examination:  Mental Status:  Lethargic but arousable, oriented, thought content appropriate. Speech slightly slurred without evidence of aphasia. Able to follow most commands with prompting. Poorly cooperative with exam. Cranial Nerves:  II-difficult  to assess due to the poor visual acuity, including with glasses.  III/IV/VI-Pupils were equal and reacted. Extraocular movements were full and conjugate.  V/VII-slight left facial numbness to tactile sensation; mild left lower facial weakness.  VIII-normal.  X- mild dysarthria.  Motor: Moderately severe weakness of left lower extremity proximally; mild weakness of the left upper extremity.  Appears abnormal strength of the right upper and lower extremities; flaccid muscle tone throughout.  Sensory: Reduced perception of tactile sensation over left extremities compared to right extremities.  Deep Tendon Reflexes: Trace to 1+ and symmetric.  Plantars: Mute bilaterally  Cerebellar: Difficult to assess because of her poor vision.   ASSESSMENT Ms. JOYANNE EDDINGER is a 73 y.o. female presenting with left hemiparesis and dysarthria. TPA was not initiated secondary to recent stroke in March. Head CT showed no acute infarct. MRI/MRA is pending.  Old lacunar infarcts noted with small vessel disease. On clopidogrel 75 mg orally every day prior to admission. Now on clopidogrel 75 mg orally every day for secondary  stroke prevention. Patient with resultant left hemiplegia and left hemisensory deficits. Stroke work up underway.   Old lacunar infarcts in both hemispheres  Hyperlipidemia - Lipitor prior to admission. Cholesterol 186 LDL 108.  Diabetes mellitus - hemoglobin A1c 10.4. Goal less than 7  Hypoglycemic on admission with glucose in the 40s.  Hypertension  Chronic renal insufficiency  Anemia    Hospital day # 2  TREATMENT/PLAN  Continue clopidogrel 75 mg orally every day for secondary stroke prevention.  Rehabilitation consult recommended by physical therapy  Risk factor modification. Needs better glucose control.  May need TEE and loop for possibly embolic infarcts. Will call cardiology  Therapist reports systolic blood pressure today greater than 220. Will resume Lotensin at 10 mg  daily. (20mg  QD PTA)   Delton Seeavid Rinehuls PA-C [facility] Pager [phone] 03/22/2014, 9:36 AM  I have personally obtained a history, examined the patient, evaluated imaging results, and formulated the assessment and plan of care. I agree with the above.  York Spanielharles K Koya Hunger   To contact Stroke Continuity provider, please refer to [email or web address]. After hours, contact General Neurology

## 2014-03-23 ENCOUNTER — Encounter (HOSPITAL_COMMUNITY): Payer: Self-pay

## 2014-03-23 ENCOUNTER — Ambulatory Visit (HOSPITAL_COMMUNITY): Admit: 2014-03-23 | Payer: Self-pay | Admitting: Internal Medicine

## 2014-03-23 ENCOUNTER — Encounter (HOSPITAL_COMMUNITY)
Admission: EM | Disposition: A | Discharge status: Home / Self Care | Payer: Self-pay | Source: Home / Self Care | Attending: Internal Medicine

## 2014-03-23 DIAGNOSIS — E785 Hyperlipidemia, unspecified: Secondary | ICD-10-CM

## 2014-03-23 DIAGNOSIS — M7989 Other specified soft tissue disorders: Secondary | ICD-10-CM

## 2014-03-23 DIAGNOSIS — I6340 Cerebral infarction due to embolism of unspecified cerebral artery: Secondary | ICD-10-CM

## 2014-03-23 DIAGNOSIS — E162 Hypoglycemia, unspecified: Secondary | ICD-10-CM | POA: Diagnosis not present

## 2014-03-23 DIAGNOSIS — M79609 Pain in unspecified limb: Secondary | ICD-10-CM

## 2014-03-23 DIAGNOSIS — I6350 Cerebral infarction due to unspecified occlusion or stenosis of unspecified cerebral artery: Secondary | ICD-10-CM | POA: Diagnosis not present

## 2014-03-23 HISTORY — PX: TEE WITHOUT CARDIOVERSION: SHX5443

## 2014-03-23 LAB — GLUCOSE, CAPILLARY
GLUCOSE-CAPILLARY: 42 mg/dL — AB (ref 70–99)
GLUCOSE-CAPILLARY: 98 mg/dL (ref 70–99)
Glucose-Capillary: 213 mg/dL — ABNORMAL HIGH (ref 70–99)
Glucose-Capillary: 142 mg/dL — ABNORMAL HIGH (ref 70–99)
Glucose-Capillary: 105 mg/dL — ABNORMAL HIGH (ref 70–99)

## 2014-03-23 SURGERY — ECHOCARDIOGRAM, TRANSESOPHAGEAL
Anesthesia: Moderate Sedation

## 2014-03-23 MED ORDER — FENTANYL CITRATE 0.05 MG/ML IJ SOLN
INTRAMUSCULAR | Status: DC | PRN
  Administered 2014-03-23: 25 ug via INTRAVENOUS

## 2014-03-23 MED ORDER — BUTAMBEN-TETRACAINE-BENZOCAINE 2-2-14 % EX AERO
INHALATION_SPRAY | CUTANEOUS | Status: DC | PRN
Start: 2014-03-23 — End: 2014-03-23
  Administered 2014-03-23: 2 via TOPICAL

## 2014-03-23 MED ORDER — MIDAZOLAM HCL 5 MG/ML IJ SOLN
INTRAMUSCULAR | Status: AC
  Filled 2014-03-23: qty 2

## 2014-03-23 MED ORDER — MIDAZOLAM HCL 10 MG/2ML IJ SOLN
INTRAMUSCULAR | Status: DC | PRN
  Administered 2014-03-23: 2 mg via INTRAVENOUS

## 2014-03-23 MED ORDER — SODIUM CHLORIDE 0.9 % IV SOLN: INTRAVENOUS | Status: DC

## 2014-03-23 MED ORDER — FENTANYL CITRATE 0.05 MG/ML IJ SOLN
INTRAMUSCULAR | Status: AC
  Filled 2014-03-23: qty 2

## 2014-03-23 NOTE — Progress Notes (Signed)
PATIENT DETAILS Name: [name] [name] Age: 73 y.o. Sex: female Date of Birth: 05-18-1941 Admit Date: 03/20/2014 Admitting Physician Cristal Ford, MD ZOX:[id number],[id number] R., MD  Subjective: No major complaints overnight-wants to go home  Assessment/Plan: Principal Problem:  Acute CVA (cerebral infarction) - Admitted with acute on chronic left-sided weakness, although hypoglycemic, continued to have persistent left-sided weakness even following correction of hypoglycemia. MRI brain on 5/16 confirmed acute CVA. Neurology consulted, currently on Plavix. Sinus rhythm on telemetry monitor. Recent echocardiogram on 3/7 showed EF around 60-65%, recent carotid Doppler on 3/8 showed 1-10 9% ICA stenosis. LDL 108, hemoglobin A1c 9.9. Seen by physical therapy, current recommendations are for CIR evaluation-however patient wanting to go home.  On exam, continues to have mild left-sided weakness.Neurology recommended TEE-scheduled for later today.    Hypertension - Continue benazepril and metoprolol, we'll continue to follow BP trend and adjust medications accordingly.    Hyperlipidemia - Continue with statins. Does of Lipitor increased to 40 mg as an LDL still more than 100     DM type 2, uncontrolled - CBGs stable- continue SSI. Resume insulin 70/3015 units twice a day. Resume metformin on discharge.    Hypoglycemia - Resolved. - Continue to monitor CBGs closely while on insulin  ARF on Chronic kidney disease stage III  -resolved, creatinine back to baseline  Hypothyroidism - Continue with levothyroxine  Disposition: Remain inpatient-CIR if patient agreeable-but indicates she may sign out against medical advice.  DVT Prophylaxis: Prophylactic Lovenox  Code Status: Full code  Family Communication Husband at bedside  Procedures:  None  CONSULTS:  neurology  MEDICATIONS: Scheduled Meds: . atorvastatin  40 mg Oral q1800  . benazepril  10 mg Oral Daily  .  cholecalciferol  2,000 Units Oral Daily  . clopidogrel  75 mg Oral Q breakfast  . enoxaparin (LOVENOX) injection  40 mg Subcutaneous Daily  . fluticasone  1 spray Each Nare Daily  . gabapentin  300 mg Oral BID  . hydrochlorothiazide  25 mg Oral Daily  . insulin aspart  0-9 Units Subcutaneous TID WC  . insulin aspart protamine- aspart  15 Units Subcutaneous BID WC  . levothyroxine  50 mcg Oral QAC breakfast  . loratadine  10 mg Oral Daily  . metoprolol succinate  25 mg Oral BID  . pantoprazole  80 mg Oral Daily  . pneumococcal 23 valent vaccine  0.5 mL Intramuscular Tomorrow-1000   Continuous Infusions: . sodium chloride     PRN Meds:.acetaminophen, albuterol, diphenhydrAMINE, hydrALAZINE, senna-docusate  Antibiotics: Anti-infectives   None       PHYSICAL EXAM: Vital signs in last 24 hours: Filed Vitals:   03/22/14 2129 03/23/14 0126 03/23/14 0524 03/23/14 1031  BP: 166/53 182/62 184/64 177/48  Pulse: 78 82 91 82  Temp: 98.6 F (37 C) 98.8 F (37.1 C) 99.3 F (37.4 C) 99.2 F (37.3 C)  TempSrc: Oral Oral Oral Oral  Resp: 18 18 18 18   SpO2: 98% 98% 96% 96%    Weight change:  There were no vitals filed for this visit. There is no weight on file to calculate BMI.   Gen Exam: Awake and alert with clear speech.   Neck: Supple, No JVD.   Chest: B/L Clear.   CVS: S1 S2 Regular, no murmurs.  Abdomen: soft, BS +, non tender, non distended.  Extremities: no edema, lower extremities warm to touch. Neurologic: Mild left-sided weakness. Skin: No Rash.   Wounds: N/A.  Intake/Output from previous day: No intake or output data in the 24 hours ending 03/23/14 1142   LAB RESULTS: CBC  Recent Labs Lab 03/20/14 2156 03/21/14 0440  WBC 6.4 5.7  HGB 11.9* 10.4*  HCT 34.3* 30.7*  PLT 256 242  MCV 88.2 87.7  MCH 30.6 29.7  MCHC 34.7 33.9  RDW 12.4 12.6  LYMPHSABS 2.5  --   MONOABS 0.7  --   EOSABS 0.1  --   BASOSABS 0.0  --     Chemistries   Recent  Labs Lab 03/20/14 2156 03/21/14 0440 03/22/14 0657  NA 141 142 141  K 3.8 3.5* 4.0  CL 99 101 101  CO2 30 31 28   GLUCOSE 42* 47* 249*  BUN 25* 26* 18  CREATININE 1.48* 1.35* 1.08  CALCIUM 9.5 8.7 9.0    CBG:  Recent Labs Lab 03/22/14 0639 03/22/14 1159 03/22/14 1728 03/22/14 2132 03/23/14 0654  GLUCAP 223* 276* 283* 147* 213*    GFR The CrCl is unknown because both a height and weight (above a minimum accepted value) are required for this calculation.  Coagulation profile  Recent Labs Lab 03/20/14 2156  INR 1.03    Cardiac Enzymes No results found for this basename: CK, CKMB, TROPONINI, MYOGLOBIN,  in the last 168 hours  No components found with this basename: POCBNP,  No results found for this basename: DDIMER,  in the last 72 hours  Recent Labs  03/21/14 0440 03/21/14 2238  HGBA1C 9.9* 10.1*    Recent Labs  03/21/14 0440  CHOL 186  HDL 48  LDLCALC 108*  TRIG 150*  CHOLHDL 3.9    Recent Labs  03/21/14 2238  TSH 0.869   No results found for this basename: VITAMINB12, FOLATE, FERRITIN, TIBC, IRON, RETICCTPCT,  in the last 72 hours No results found for this basename: LIPASE, AMYLASE,  in the last 72 hours  Urine Studies No results found for this basename: UACOL, UAPR, USPG, UPH, UTP, UGL, UKET, UBIL, UHGB, UNIT, UROB, ULEU, UEPI, UWBC, URBC, UBAC, CAST, CRYS, UCOM, BILUA,  in the last 72 hours  MICROBIOLOGY: Recent Results (from the past 240 hour(s))  URINE CULTURE     Status: None   Collection Time    03/20/14 10:36 PM      Result Value Ref Range Status   Specimen Description URINE, CLEAN CATCH   Final   Special Requests NONE   Final   Culture  Setup Time     Final   Value: 03/21/2014 04:12     Performed at [facility]   Colony Count     Final   Value: 30,000 COLONIES/ML     Performed at [facility]   Culture     Final   Value: Multiple bacterial morphotypes present, none predominant. Suggest appropriate  recollection if clinically indicated.     Performed at [facility]   Report Status 03/22/2014 FINAL   Final    RADIOLOGY STUDIES/RESULTS: Dg Chest 2 View  03/22/2014   CLINICAL DATA:  Stroke symptoms with history of hypertension and diabetes  EXAM: CHEST  2 VIEW  COMPARISON:  DG CHEST 2 VIEW dated 01/10/2014  FINDINGS: The lungs are adequately inflated and clear. The cardiopericardial silhouette is normal in size. The pulmonary vascularity is not engorged. The mediastinum is normal in width. There is no pleural effusion. The observed portions of the bony thorax appear normal.  IMPRESSION: There is no evidence of pneumonia nor CHF. Mild hyperinflation may  be voluntary or could reflect underlying COPD.   Electronically Signed   By: David  [place]   On: 03/22/2014 00:54   Ct Head (brain) Wo Contrast  03/20/2014   CLINICAL DATA:  Left-sided weakness  EXAM: CT HEAD WITHOUT CONTRAST  TECHNIQUE: Contiguous axial images were obtained from the base of the skull through the vertex without intravenous contrast.  COMPARISON:  CT HEAD W/O CM dated 01/14/2014; MR HEAD W/O CM dated 01/09/2014  FINDINGS: There is mild diffuse cerebral and cerebellar atrophy with compensatory ventriculomegaly. These findings are stable. There is decreased density in the deep white matter of both cerebral hemispheres consistent with chronic small vessel ischemic type change. There are old lacunar infarctions in the basal ganglia bilaterally. There is no evidence of an acute intracranial hemorrhage. There is no objective evidence of an acute evolving ischemic infarction. There are basal ganglia calcifications bilaterally which are stable. The cerebellum and brainstem are normal in density.  At bone window settings the observed portions of paranasal sinuses and mastoid air cells are clear. There is no evidence of an acute skull fracture.  IMPRESSION: 1. There is no evidence of an acute ischemic infarction. There are old lacunar infarctions  in both cerebral hemispheres, and there is decreased density in the deep white matter of both cerebral hemispheres consistent with chronic small vessel ischemia. 2. There is no acute intracranial hemorrhage. 3. There are stable basal ganglia calcifications bilaterally. 4. These results were called by telephone at the time of interpretation on 03/20/2014 at 10:07 PM to Dr. Roseanne RenoStewart, who verbally acknowledged these results.   Electronically Signed   By: David  [place]   On: 03/20/2014 22:09   Mr Brain Wo Contrast  03/22/2014   CLINICAL DATA:  Evaluate stroke, hypoglycemia, intermittent slurred speech.  EXAM: MRI HEAD WITHOUT CONTRAST  MRA HEAD WITHOUT CONTRAST  TECHNIQUE: Multiplanar, multiecho pulse sequences of the brain and surrounding structures were obtained without intravenous contrast. Angiographic images of the head were obtained using MRA technique without contrast.  COMPARISON:  None.  MR HEAD W/O CM dated 01/09/2014; CT HEAD W/O CM dated 03/20/2014; CT HEAD W/O CM dated 01/14/2014  FINDINGS: MRI HEAD FINDINGS  Punctate focus of reduced diffusion within the left mesial temporal lobe, axial 14/60. Corresponding low ADC values. 7 mm focus of reduced diffusion within the left periventricular frontal white matter, axial 20/60 with corresponding low ADC values. 3 mm focus of reduced diffusion within the left superior cerebellum, with corresponding low ADC values.  Moderately motion degraded gradient sequence, with scattered subcentimeter foci of susceptibility artifact within the supra and infratentorial brain, better seen on prior less motion degraded MRI. No convincing evidence of new micro hemorrhages. No midline shift or mass effect.  Moderate ventriculomegaly, likely on the basis of global parenchymal brain volume loss as there is overall commensurate enlargement cerebral sulci and cerebellar folia. Severe white matter changes, including pontine T2 hyperintensities are similar. Remote bilateral basal ganglia,  thalamic and cerebellar infarcts. No midline shift or mass effect.  No abnormal extra-axial fluid collections. Status post bilateral ocular lens implants. Patient is edentulous. No abnormal sellar expansion. No cerebellar tonsillar ectopia.  MRA HEAD FINDINGS  Anterior circulation: Mildly motion degraded evaluation limits the sensitivity.  Anterior circulation: Normal flow related enhancement of the included cervical, petrous, cavernous internal carotid arteries. However, suspected at least 50% stenosis of the left anterior genu of the internal carotid artery corresponding to coarse calcification on prior head CT. Finding may be accentuated by patient motion.  Normal flow related enhancement of the supra clinoid internal carotid arteries, the right is more robust. Patent flow related enhancement of the anterior and middle cerebral arteries. At least 50% narrowing of the origin of a left M2 branch similar to worse.  Posterior circulation: Motion degrades evaluation. Thready flow of the left vertebral artery throughout the intradural segment, similar to prior examination. Basilar artery is patent, patent main branch vessels. Posterior communicating arteries are not distinctly identified. Focal high-grade stenosis of the right P2 segment, axial 97/160. Normal flow within the enhancement of the P3 segments.  No convincing evidence of aneurysm. No large vessel occlusion. Mild irregularity of the intracranial vessels diffusely most consistent with atherosclerosis.  IMPRESSION: MRI head: Left mesial temporal lobe, left periventricular frontal white matter and left superior cerebellar subcentimeter foci of acute ischemia. These span multiple vascular territories, and may reflect thromboembolic disease without acute large territory infarct.  Moderately motion degraded examination. Scattered susceptibility artifact most consistent with sequelae of chronic hypertension, with multiple remote basal ganglia/thalamic and cerebellar  infarcts. Severe white matter changes suggest chronic small vessel ischemic disease.  MRA head: Motion degraded examination. Multifocal intracranial mid to high-grade stenosis, including anterior genu of the left internal carotid artery, left M2, right P2 and left distal vertebral arteries, relatively unchanged for this motion degraded examination.  Diffuse mild intracranial luminal irregularity compatible with atherosclerosis.   Electronically Signed   By: Awilda Metro   On: 03/22/2014 01:04   Mr Maxine Glenn Head/brain Wo Cm  03/22/2014   CLINICAL DATA:  Evaluate stroke, hypoglycemia, intermittent slurred speech.  EXAM: MRI HEAD WITHOUT CONTRAST  MRA HEAD WITHOUT CONTRAST  TECHNIQUE: Multiplanar, multiecho pulse sequences of the brain and surrounding structures were obtained without intravenous contrast. Angiographic images of the head were obtained using MRA technique without contrast.  COMPARISON:  None.  MR HEAD W/O CM dated 01/09/2014; CT HEAD W/O CM dated 03/20/2014; CT HEAD W/O CM dated 01/14/2014  FINDINGS: MRI HEAD FINDINGS  Punctate focus of reduced diffusion within the left mesial temporal lobe, axial 14/60. Corresponding low ADC values. 7 mm focus of reduced diffusion within the left periventricular frontal white matter, axial 20/60 with corresponding low ADC values. 3 mm focus of reduced diffusion within the left superior cerebellum, with corresponding low ADC values.  Moderately motion degraded gradient sequence, with scattered subcentimeter foci of susceptibility artifact within the supra and infratentorial brain, better seen on prior less motion degraded MRI. No convincing evidence of new micro hemorrhages. No midline shift or mass effect.  Moderate ventriculomegaly, likely on the basis of global parenchymal brain volume loss as there is overall commensurate enlargement cerebral sulci and cerebellar folia. Severe white matter changes, including pontine T2 hyperintensities are similar. Remote bilateral  basal ganglia, thalamic and cerebellar infarcts. No midline shift or mass effect.  No abnormal extra-axial fluid collections. Status post bilateral ocular lens implants. Patient is edentulous. No abnormal sellar expansion. No cerebellar tonsillar ectopia.  MRA HEAD FINDINGS  Anterior circulation: Mildly motion degraded evaluation limits the sensitivity.  Anterior circulation: Normal flow related enhancement of the included cervical, petrous, cavernous internal carotid arteries. However, suspected at least 50% stenosis of the left anterior genu of the internal carotid artery corresponding to coarse calcification on prior head CT. Finding may be accentuated by patient motion. Normal flow related enhancement of the supra clinoid internal carotid arteries, the right is more robust. Patent flow related enhancement of the anterior and middle cerebral arteries. At least 50% narrowing of the origin of  a left M2 branch similar to worse.  Posterior circulation: Motion degrades evaluation. Thready flow of the left vertebral artery throughout the intradural segment, similar to prior examination. Basilar artery is patent, patent main branch vessels. Posterior communicating arteries are not distinctly identified. Focal high-grade stenosis of the right P2 segment, axial 97/160. Normal flow within the enhancement of the P3 segments.  No convincing evidence of aneurysm. No large vessel occlusion. Mild irregularity of the intracranial vessels diffusely most consistent with atherosclerosis.  IMPRESSION: MRI head: Left mesial temporal lobe, left periventricular frontal white matter and left superior cerebellar subcentimeter foci of acute ischemia. These span multiple vascular territories, and may reflect thromboembolic disease without acute large territory infarct.  Moderately motion degraded examination. Scattered susceptibility artifact most consistent with sequelae of chronic hypertension, with multiple remote basal ganglia/thalamic  and cerebellar infarcts. Severe white matter changes suggest chronic small vessel ischemic disease.  MRA head: Motion degraded examination. Multifocal intracranial mid to high-grade stenosis, including anterior genu of the left internal carotid artery, left M2, right P2 and left distal vertebral arteries, relatively unchanged for this motion degraded examination.  Diffuse mild intracranial luminal irregularity compatible with atherosclerosis.   Electronically Signed   By: Awilda Metro   On: 03/22/2014 01:04    Shanker Levora Dredge, MD  [facility] Pager:[phone]  If 7PM-7AM, please contact night-coverage [email or web address] Password [email or web address] 03/23/2014, 11:42 AM   LOS: 3 days   **Disclaimer: This note may have been dictated with voice recognition software. Similar sounding words can inadvertently be transcribed and this note may contain transcription errors which may not have been corrected upon publication of note.**

## 2014-03-23 NOTE — CV Procedure (Signed)
     Transesophageal Echocardiogram Note  [name] FullingBetty J [name] [id number] 1940/12/26  Procedure: Transesophageal Echocardiogram Indications: CVA  Procedure Details Consent: Obtained Time Out: Verified patient identification, verified procedure, site/side was marked, verified correct patient position, special equipment/implants available, Radiology Safety Procedures followed,  medications/allergies/relevent history reviewed, required imaging and test results available.  Performed  Medications: Fentanyl: 25 mcg Versed: 2 mg  Left Ventrical:  The cavity size was normal. Wall thickness was increased in a pattern of mild LVH. Systolic function was normal. The estimated ejection fraction was in the range of 60% to 65%. Wall motion was normal; there were no regional wall motion abnormalities.  Mitral Valve: Normal, no vegetation, mild MR  Aortic Valve: Normal, no AI, no vegetation.   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   [name] MassonKatarina H Markeia Harkless, MD, [facility] 03/23/2014, 2:15 PM

## 2014-03-23 NOTE — Progress Notes (Signed)
VASCULAR LAB PRELIMINARY  PRELIMINARY  PRELIMINARY  PRELIMINARY  Bilateral lower extremity venous duplex completed.    Preliminary report:  Bilateral:  No evidence of DVT, superficial thrombosis, or Baker's Cyst.   Nolberto HanlonVirginia D Keasia Dubose, RVS 03/23/2014, 7:17 PM

## 2014-03-23 NOTE — Progress Notes (Signed)
  Echocardiogram Echocardiogram Transesophageal has been performed.  Keosha Rossa Esmond HarpsM Haleema Vanderheyden 03/23/2014, 3:20 PM

## 2014-03-23 NOTE — Progress Notes (Signed)
Speech Pathology cancellation: Pt at procedure; f/u next date for speech/language evaluation.  Eustacia Urbanek L. Samson Fredericouture, [place] CCC/SLP Pager [phone]

## 2014-03-23 NOTE — Progress Notes (Signed)
Rehab Admissions Coordinator Note:  Patient was screened by Andrey FarmerJanine L Love Chowning for appropriateness for an Inpatient Acute Rehab Consult.  At this time, we are recommending Inpatient Rehab consult.  Isaiyah Feldhaus L Raylynne Cubbage, PT 03/23/2014, 8:39 AM  I can be reached at [phone].

## 2014-03-23 NOTE — H&P (View-Only) (Signed)
PATIENT DETAILS Name: [name] [name] Age: 73 y.o. Sex: female Date of Birth: 05-18-1941 Admit Date: 03/20/2014 Admitting Physician Cristal Ford, MD ZOX:[id number],[id number] R., MD  Subjective: No major complaints overnight-wants to go home  Assessment/Plan: Principal Problem:  Acute CVA (cerebral infarction) - Admitted with acute on chronic left-sided weakness, although hypoglycemic, continued to have persistent left-sided weakness even following correction of hypoglycemia. MRI brain on 5/16 confirmed acute CVA. Neurology consulted, currently on Plavix. Sinus rhythm on telemetry monitor. Recent echocardiogram on 3/7 showed EF around 60-65%, recent carotid Doppler on 3/8 showed 1-10 9% ICA stenosis. LDL 108, hemoglobin A1c 9.9. Seen by physical therapy, current recommendations are for CIR evaluation-however patient wanting to go home.  On exam, continues to have mild left-sided weakness.Neurology recommended TEE-scheduled for later today.    Hypertension - Continue benazepril and metoprolol, we'll continue to follow BP trend and adjust medications accordingly.    Hyperlipidemia - Continue with statins. Does of Lipitor increased to 40 mg as an LDL still more than 100     DM type 2, uncontrolled - CBGs stable- continue SSI. Resume insulin 70/3015 units twice a day. Resume metformin on discharge.    Hypoglycemia - Resolved. - Continue to monitor CBGs closely while on insulin  ARF on Chronic kidney disease stage III  -resolved, creatinine back to baseline  Hypothyroidism - Continue with levothyroxine  Disposition: Remain inpatient-CIR if patient agreeable-but indicates she may sign out against medical advice.  DVT Prophylaxis: Prophylactic Lovenox  Code Status: Full code  Family Communication Husband at bedside  Procedures:  None  CONSULTS:  neurology  MEDICATIONS: Scheduled Meds: . atorvastatin  40 mg Oral q1800  . benazepril  10 mg Oral Daily  .  cholecalciferol  2,000 Units Oral Daily  . clopidogrel  75 mg Oral Q breakfast  . enoxaparin (LOVENOX) injection  40 mg Subcutaneous Daily  . fluticasone  1 spray Each Nare Daily  . gabapentin  300 mg Oral BID  . hydrochlorothiazide  25 mg Oral Daily  . insulin aspart  0-9 Units Subcutaneous TID WC  . insulin aspart protamine- aspart  15 Units Subcutaneous BID WC  . levothyroxine  50 mcg Oral QAC breakfast  . loratadine  10 mg Oral Daily  . metoprolol succinate  25 mg Oral BID  . pantoprazole  80 mg Oral Daily  . pneumococcal 23 valent vaccine  0.5 mL Intramuscular Tomorrow-1000   Continuous Infusions: . sodium chloride     PRN Meds:.acetaminophen, albuterol, diphenhydrAMINE, hydrALAZINE, senna-docusate  Antibiotics: Anti-infectives   None       PHYSICAL EXAM: Vital signs in last 24 hours: Filed Vitals:   03/22/14 2129 03/23/14 0126 03/23/14 0524 03/23/14 1031  BP: 166/53 182/62 184/64 177/48  Pulse: 78 82 91 82  Temp: 98.6 F (37 C) 98.8 F (37.1 C) 99.3 F (37.4 C) 99.2 F (37.3 C)  TempSrc: Oral Oral Oral Oral  Resp: 18 18 18 18   SpO2: 98% 98% 96% 96%    Weight change:  There were no vitals filed for this visit. There is no weight on file to calculate BMI.   Gen Exam: Awake and alert with clear speech.   Neck: Supple, No JVD.   Chest: B/L Clear.   CVS: S1 S2 Regular, no murmurs.  Abdomen: soft, BS +, non tender, non distended.  Extremities: no edema, lower extremities warm to touch. Neurologic: Mild left-sided weakness. Skin: No Rash.   Wounds: N/A.  Intake/Output from previous day: No intake or output data in the 24 hours ending 03/23/14 1142   LAB RESULTS: CBC  Recent Labs Lab 03/20/14 2156 03/21/14 0440  WBC 6.4 5.7  HGB 11.9* 10.4*  HCT 34.3* 30.7*  PLT 256 242  MCV 88.2 87.7  MCH 30.6 29.7  MCHC 34.7 33.9  RDW 12.4 12.6  LYMPHSABS 2.5  --   MONOABS 0.7  --   EOSABS 0.1  --   BASOSABS 0.0  --     Chemistries   Recent  Labs Lab 03/20/14 2156 03/21/14 0440 03/22/14 0657  NA 141 142 141  K 3.8 3.5* 4.0  CL 99 101 101  CO2 30 31 28   GLUCOSE 42* 47* 249*  BUN 25* 26* 18  CREATININE 1.48* 1.35* 1.08  CALCIUM 9.5 8.7 9.0    CBG:  Recent Labs Lab 03/22/14 0639 03/22/14 1159 03/22/14 1728 03/22/14 2132 03/23/14 0654  GLUCAP 223* 276* 283* 147* 213*    GFR The CrCl is unknown because both a height and weight (above a minimum accepted value) are required for this calculation.  Coagulation profile  Recent Labs Lab 03/20/14 2156  INR 1.03    Cardiac Enzymes No results found for this basename: CK, CKMB, TROPONINI, MYOGLOBIN,  in the last 168 hours  No components found with this basename: POCBNP,  No results found for this basename: DDIMER,  in the last 72 hours  Recent Labs  03/21/14 0440 03/21/14 2238  HGBA1C 9.9* 10.1*    Recent Labs  03/21/14 0440  CHOL 186  HDL 48  LDLCALC 108*  TRIG 150*  CHOLHDL 3.9    Recent Labs  03/21/14 2238  TSH 0.869   No results found for this basename: VITAMINB12, FOLATE, FERRITIN, TIBC, IRON, RETICCTPCT,  in the last 72 hours No results found for this basename: LIPASE, AMYLASE,  in the last 72 hours  Urine Studies No results found for this basename: UACOL, UAPR, USPG, UPH, UTP, UGL, UKET, UBIL, UHGB, UNIT, UROB, ULEU, UEPI, UWBC, URBC, UBAC, CAST, CRYS, UCOM, BILUA,  in the last 72 hours  MICROBIOLOGY: Recent Results (from the past 240 hour(s))  URINE CULTURE     Status: None   Collection Time    03/20/14 10:36 PM      Result Value Ref Range Status   Specimen Description URINE, CLEAN CATCH   Final   Special Requests NONE   Final   Culture  Setup Time     Final   Value: 03/21/2014 04:12     Performed at [facility]   Colony Count     Final   Value: 30,000 COLONIES/ML     Performed at [facility]   Culture     Final   Value: Multiple bacterial morphotypes present, none predominant. Suggest appropriate  recollection if clinically indicated.     Performed at [facility]   Report Status 03/22/2014 FINAL   Final    RADIOLOGY STUDIES/RESULTS: Dg Chest 2 View  03/22/2014   CLINICAL DATA:  Stroke symptoms with history of hypertension and diabetes  EXAM: CHEST  2 VIEW  COMPARISON:  DG CHEST 2 VIEW dated 01/10/2014  FINDINGS: The lungs are adequately inflated and clear. The cardiopericardial silhouette is normal in size. The pulmonary vascularity is not engorged. The mediastinum is normal in width. There is no pleural effusion. The observed portions of the bony thorax appear normal.  IMPRESSION: There is no evidence of pneumonia nor CHF. Mild hyperinflation may  be voluntary or could reflect underlying COPD.   Electronically Signed   By: David  [place]   On: 03/22/2014 00:54   Ct Head (brain) Wo Contrast  03/20/2014   CLINICAL DATA:  Left-sided weakness  EXAM: CT HEAD WITHOUT CONTRAST  TECHNIQUE: Contiguous axial images were obtained from the base of the skull through the vertex without intravenous contrast.  COMPARISON:  CT HEAD W/O CM dated 01/14/2014; MR HEAD W/O CM dated 01/09/2014  FINDINGS: There is mild diffuse cerebral and cerebellar atrophy with compensatory ventriculomegaly. These findings are stable. There is decreased density in the deep white matter of both cerebral hemispheres consistent with chronic small vessel ischemic type change. There are old lacunar infarctions in the basal ganglia bilaterally. There is no evidence of an acute intracranial hemorrhage. There is no objective evidence of an acute evolving ischemic infarction. There are basal ganglia calcifications bilaterally which are stable. The cerebellum and brainstem are normal in density.  At bone window settings the observed portions of paranasal sinuses and mastoid air cells are clear. There is no evidence of an acute skull fracture.  IMPRESSION: 1. There is no evidence of an acute ischemic infarction. There are old lacunar infarctions  in both cerebral hemispheres, and there is decreased density in the deep white matter of both cerebral hemispheres consistent with chronic small vessel ischemia. 2. There is no acute intracranial hemorrhage. 3. There are stable basal ganglia calcifications bilaterally. 4. These results were called by telephone at the time of interpretation on 03/20/2014 at 10:07 PM to Dr. Roseanne RenoStewart, who verbally acknowledged these results.   Electronically Signed   By: David  [place]   On: 03/20/2014 22:09   Mr Brain Wo Contrast  03/22/2014   CLINICAL DATA:  Evaluate stroke, hypoglycemia, intermittent slurred speech.  EXAM: MRI HEAD WITHOUT CONTRAST  MRA HEAD WITHOUT CONTRAST  TECHNIQUE: Multiplanar, multiecho pulse sequences of the brain and surrounding structures were obtained without intravenous contrast. Angiographic images of the head were obtained using MRA technique without contrast.  COMPARISON:  None.  MR HEAD W/O CM dated 01/09/2014; CT HEAD W/O CM dated 03/20/2014; CT HEAD W/O CM dated 01/14/2014  FINDINGS: MRI HEAD FINDINGS  Punctate focus of reduced diffusion within the left mesial temporal lobe, axial 14/60. Corresponding low ADC values. 7 mm focus of reduced diffusion within the left periventricular frontal white matter, axial 20/60 with corresponding low ADC values. 3 mm focus of reduced diffusion within the left superior cerebellum, with corresponding low ADC values.  Moderately motion degraded gradient sequence, with scattered subcentimeter foci of susceptibility artifact within the supra and infratentorial brain, better seen on prior less motion degraded MRI. No convincing evidence of new micro hemorrhages. No midline shift or mass effect.  Moderate ventriculomegaly, likely on the basis of global parenchymal brain volume loss as there is overall commensurate enlargement cerebral sulci and cerebellar folia. Severe white matter changes, including pontine T2 hyperintensities are similar. Remote bilateral basal ganglia,  thalamic and cerebellar infarcts. No midline shift or mass effect.  No abnormal extra-axial fluid collections. Status post bilateral ocular lens implants. Patient is edentulous. No abnormal sellar expansion. No cerebellar tonsillar ectopia.  MRA HEAD FINDINGS  Anterior circulation: Mildly motion degraded evaluation limits the sensitivity.  Anterior circulation: Normal flow related enhancement of the included cervical, petrous, cavernous internal carotid arteries. However, suspected at least 50% stenosis of the left anterior genu of the internal carotid artery corresponding to coarse calcification on prior head CT. Finding may be accentuated by patient motion.  Normal flow related enhancement of the supra clinoid internal carotid arteries, the right is more robust. Patent flow related enhancement of the anterior and middle cerebral arteries. At least 50% narrowing of the origin of a left M2 branch similar to worse.  Posterior circulation: Motion degrades evaluation. Thready flow of the left vertebral artery throughout the intradural segment, similar to prior examination. Basilar artery is patent, patent main branch vessels. Posterior communicating arteries are not distinctly identified. Focal high-grade stenosis of the right P2 segment, axial 97/160. Normal flow within the enhancement of the P3 segments.  No convincing evidence of aneurysm. No large vessel occlusion. Mild irregularity of the intracranial vessels diffusely most consistent with atherosclerosis.  IMPRESSION: MRI head: Left mesial temporal lobe, left periventricular frontal white matter and left superior cerebellar subcentimeter foci of acute ischemia. These span multiple vascular territories, and may reflect thromboembolic disease without acute large territory infarct.  Moderately motion degraded examination. Scattered susceptibility artifact most consistent with sequelae of chronic hypertension, with multiple remote basal ganglia/thalamic and cerebellar  infarcts. Severe white matter changes suggest chronic small vessel ischemic disease.  MRA head: Motion degraded examination. Multifocal intracranial mid to high-grade stenosis, including anterior genu of the left internal carotid artery, left M2, right P2 and left distal vertebral arteries, relatively unchanged for this motion degraded examination.  Diffuse mild intracranial luminal irregularity compatible with atherosclerosis.   Electronically Signed   By: Awilda Metro   On: 03/22/2014 01:04   Mr Maxine Glenn Head/brain Wo Cm  03/22/2014   CLINICAL DATA:  Evaluate stroke, hypoglycemia, intermittent slurred speech.  EXAM: MRI HEAD WITHOUT CONTRAST  MRA HEAD WITHOUT CONTRAST  TECHNIQUE: Multiplanar, multiecho pulse sequences of the brain and surrounding structures were obtained without intravenous contrast. Angiographic images of the head were obtained using MRA technique without contrast.  COMPARISON:  None.  MR HEAD W/O CM dated 01/09/2014; CT HEAD W/O CM dated 03/20/2014; CT HEAD W/O CM dated 01/14/2014  FINDINGS: MRI HEAD FINDINGS  Punctate focus of reduced diffusion within the left mesial temporal lobe, axial 14/60. Corresponding low ADC values. 7 mm focus of reduced diffusion within the left periventricular frontal white matter, axial 20/60 with corresponding low ADC values. 3 mm focus of reduced diffusion within the left superior cerebellum, with corresponding low ADC values.  Moderately motion degraded gradient sequence, with scattered subcentimeter foci of susceptibility artifact within the supra and infratentorial brain, better seen on prior less motion degraded MRI. No convincing evidence of new micro hemorrhages. No midline shift or mass effect.  Moderate ventriculomegaly, likely on the basis of global parenchymal brain volume loss as there is overall commensurate enlargement cerebral sulci and cerebellar folia. Severe white matter changes, including pontine T2 hyperintensities are similar. Remote bilateral  basal ganglia, thalamic and cerebellar infarcts. No midline shift or mass effect.  No abnormal extra-axial fluid collections. Status post bilateral ocular lens implants. Patient is edentulous. No abnormal sellar expansion. No cerebellar tonsillar ectopia.  MRA HEAD FINDINGS  Anterior circulation: Mildly motion degraded evaluation limits the sensitivity.  Anterior circulation: Normal flow related enhancement of the included cervical, petrous, cavernous internal carotid arteries. However, suspected at least 50% stenosis of the left anterior genu of the internal carotid artery corresponding to coarse calcification on prior head CT. Finding may be accentuated by patient motion. Normal flow related enhancement of the supra clinoid internal carotid arteries, the right is more robust. Patent flow related enhancement of the anterior and middle cerebral arteries. At least 50% narrowing of the origin of  a left M2 branch similar to worse.  Posterior circulation: Motion degrades evaluation. Thready flow of the left vertebral artery throughout the intradural segment, similar to prior examination. Basilar artery is patent, patent main branch vessels. Posterior communicating arteries are not distinctly identified. Focal high-grade stenosis of the right P2 segment, axial 97/160. Normal flow within the enhancement of the P3 segments.  No convincing evidence of aneurysm. No large vessel occlusion. Mild irregularity of the intracranial vessels diffusely most consistent with atherosclerosis.  IMPRESSION: MRI head: Left mesial temporal lobe, left periventricular frontal white matter and left superior cerebellar subcentimeter foci of acute ischemia. These span multiple vascular territories, and may reflect thromboembolic disease without acute large territory infarct.  Moderately motion degraded examination. Scattered susceptibility artifact most consistent with sequelae of chronic hypertension, with multiple remote basal ganglia/thalamic  and cerebellar infarcts. Severe white matter changes suggest chronic small vessel ischemic disease.  MRA head: Motion degraded examination. Multifocal intracranial mid to high-grade stenosis, including anterior genu of the left internal carotid artery, left M2, right P2 and left distal vertebral arteries, relatively unchanged for this motion degraded examination.  Diffuse mild intracranial luminal irregularity compatible with atherosclerosis.   Electronically Signed   By: Awilda Metro   On: 03/22/2014 01:04    Shanker Levora Dredge, MD  [facility] Pager:[phone]  If 7PM-7AM, please contact night-coverage [email or web address] Password [email or web address] 03/23/2014, 11:42 AM   LOS: 3 days   **Disclaimer: This note may have been dictated with voice recognition software. Similar sounding words can inadvertently be transcribed and this note may contain transcription errors which may not have been corrected upon publication of note.**

## 2014-03-23 NOTE — Consult Note (Signed)
ELECTROPHYSIOLOGY CONSULT NOTE  Patient ID: [name] [name] Trahan MRN: [id number], DOB/AGE: 01/25/1941   Admit date: 03/20/2014 Date of Consult: 03/23/2014  Primary Physician: Alva GarnetSHELTON,KIMBERLY R., MD Primary Cardiologist: [facility] Reason for Consultation: Cryptogenic stroke; recommendations regarding Implantable Loop Recorder  History of Present Illness [name] [name] Moree was admitted on 03/20/2014 with left hemiparesis and dysarthria.  She was found to have left mesial temporal lobe, left periventricular frontal lobe white matter and left superior cerebellar small infarcts.  She was recently admitted 01-2014 with stroke with quickly resolving symptoms and was discharged on home on Plavix for secondary stroke prevention.  It was felt there was no indication for TEE at that time.  She has been monitored on telemetry which has demonstrated no arrhythmias. No cause has been identified. Inpatient stroke work-up is to be completed with a TEE. EP has been asked to evaluate for placement of an implantable loop recorder to monitor for atrial fibrillation.  Past Medical History Past Medical History  Diagnosis Date  . Hypertension   . Diabetes mellitus   . Stroke   . Chronic renal insufficiency   . Hyperlipidemia   . Osteopenia   . CVA (cerebral infarction)     Past Surgical History Past Surgical History  Procedure Laterality Date  . Eye surgery      Allergies/Intolerances Allergies  Allergen Reactions  . Erythromycin Other (See Comments)    unknown  . Iodine     REACTION: hives  . Penicillins     REACTION: hives   Inpatient Medications . atorvastatin  40 mg Oral q1800  . benazepril  10 mg Oral Daily  . cholecalciferol  2,000 Units Oral Daily  . clopidogrel  75 mg Oral Q breakfast  . enoxaparin (LOVENOX) injection  40 mg Subcutaneous Daily  . fluticasone  1 spray Each Nare Daily  . gabapentin  300 mg Oral BID  . hydrochlorothiazide  25 mg Oral Daily  . insulin aspart  0-9 Units  Subcutaneous TID WC  . insulin aspart protamine- aspart  15 Units Subcutaneous BID WC  . levothyroxine  50 mcg Oral QAC breakfast  . loratadine  10 mg Oral Daily  . metoprolol succinate  25 mg Oral BID  . pantoprazole  80 mg Oral Daily  . pneumococcal 23 valent vaccine  0.5 mL Intramuscular Tomorrow-1000   . sodium chloride     Social History History   Social History  . Marital Status: Single    Spouse Name: N/A    Number of Children: N/A  . Years of Education: N/A   Occupational History  . Not on file.   Social History Main Topics  . Smoking status: Current Every Day Smoker -- 0.50 packs/day for 60 years    Types: Cigarettes  . Smokeless tobacco: Not on file     Comment: pt states "sometimes I quit smoking for about 2 weeks then I start back"  . Alcohol Use: Yes     Comment: occasionally  . Drug Use: No  . Sexual Activity: Not on file   Other Topics Concern  . Not on file   Social History Narrative  . No narrative on file    Family History  Problem Relation Age of Onset  . Diabetes Mother   . Hypertension Mother     Review of Systems General: No chills, fever, night sweats or weight changes  Cardiovascular:  No chest pain, dyspnea on exertion, edema, orthopnea, palpitations, paroxysmal nocturnal dyspnea Dermatological: No rash,  lesions or masses Respiratory: No cough, dyspnea Urologic: No hematuria, dysuria Abdominal: No nausea, vomiting, diarrhea, bright red blood per rectum, melena, or hematemesis Neurologic: No visual changes, weakness, changes in mental status All other systems reviewed and are otherwise negative except as noted above.  Physical Exam Blood pressure 177/48, pulse 82, temperature 99.2 F (37.3 C), temperature source Oral, resp. rate 18, SpO2 96.00%.  General: Well developed, well appearing 73 y.o. female in no acute distress. HEENT: Normocephalic, atraumatic. EOMs intact. Sclera nonicteric. Oropharynx clear.  Neck: Supple without bruits.  No JVD. Lungs: Respirations regular and unlabored, CTA bilaterally. No wheezes, rales or rhonchi. Heart: RRR. S1, S2 present. No murmurs, rub, S3 or S4. Abdomen: Soft, non-tender, non-distended. BS present x 4 quadrants. No hepatosplenomegaly.  Extremities: No clubbing, cyanosis or edema. DP/PT/Radials 2+ and equal bilaterally. Psych: Normal affect. Neuro: Alert and oriented X 3. Moves all extremities spontaneously. Musculoskeletal: No kyphosis. Skin: Intact. Warm and dry. No rashes or petechiae in exposed areas.   Labs Lab Results  Component Value Date   WBC 5.7 03/21/2014   HGB 10.4* 03/21/2014   HCT 30.7* 03/21/2014   MCV 87.7 03/21/2014   PLT 242 03/21/2014    Recent Labs Lab 03/20/14 2156  03/22/14 0657  NA 141  < > 141  K 3.8  < > 4.0  CL 99  < > 101  CO2 30  < > 28  BUN 25*  < > 18  CREATININE 1.48*  < > 1.08  CALCIUM 9.5  < > 9.0  PROT 7.4  --   --   BILITOT <0.2*  --   --   ALKPHOS 62  --   --   ALT 7  --   --   AST 15  --   --   GLUCOSE 42*  < > 249*  < > = values in this interval not displayed.  Recent Labs  03/20/14 2156  INR 1.03    Radiology/Studies Dg Chest 2 View 03/22/2014   CLINICAL DATA:  Stroke symptoms with history of hypertension and diabetes  EXAM: CHEST  2 VIEW  COMPARISON:  DG CHEST 2 VIEW dated 01/10/2014  FINDINGS: The lungs are adequately inflated and clear. The cardiopericardial silhouette is normal in size. The pulmonary vascularity is not engorged. The mediastinum is normal in width. There is no pleural effusion. The observed portions of the bony thorax appear normal.  IMPRESSION: There is no evidence of pneumonia nor CHF. Mild hyperinflation may be voluntary or could reflect underlying COPD.   Electronically Signed   By: David  [place]   On: 03/22/2014 00:54   Ct Head (brain) Wo Contrast 03/20/2014   CLINICAL DATA:  Left-sided weakness  EXAM: CT HEAD WITHOUT CONTRAST  TECHNIQUE: Contiguous axial images were obtained from the base of the skull  through the vertex without intravenous contrast.  COMPARISON:  CT HEAD W/O CM dated 01/14/2014; MR HEAD W/O CM dated 01/09/2014  FINDINGS: There is mild diffuse cerebral and cerebellar atrophy with compensatory ventriculomegaly. These findings are stable. There is decreased density in the deep white matter of both cerebral hemispheres consistent with chronic small vessel ischemic type change. There are old lacunar infarctions in the basal ganglia bilaterally. There is no evidence of an acute intracranial hemorrhage. There is no objective evidence of an acute evolving ischemic infarction. There are basal ganglia calcifications bilaterally which are stable. The cerebellum and brainstem are normal in density.  At bone window settings the observed portions of paranasal  sinuses and mastoid air cells are clear. There is no evidence of an acute skull fracture.  IMPRESSION: 1. There is no evidence of an acute ischemic infarction. There are old lacunar infarctions in both cerebral hemispheres, and there is decreased density in the deep white matter of both cerebral hemispheres consistent with chronic small vessel ischemia. 2. There is no acute intracranial hemorrhage. 3. There are stable basal ganglia calcifications bilaterally. 4. These results were called by telephone at the time of interpretation on 03/20/2014 at 10:07 PM to Dr. Roseanne Reno, who verbally acknowledged these results.   Electronically Signed   By: David  [place]   On: 03/20/2014 22:09   Mr Brain Wo Contrast 03/22/2014   CLINICAL DATA:  Evaluate stroke, hypoglycemia, intermittent slurred speech.  EXAM: MRI HEAD WITHOUT CONTRAST  MRA HEAD WITHOUT CONTRAST  TECHNIQUE: Multiplanar, multiecho pulse sequences of the brain and surrounding structures were obtained without intravenous contrast. Angiographic images of the head were obtained using MRA technique without contrast.  COMPARISON:  [name] [name].  MR HEAD W/O CM dated 01/09/2014; CT HEAD W/O CM dated 03/20/2014; CT HEAD W/O CM  dated 01/14/2014  FINDINGS: MRI HEAD FINDINGS  Punctate focus of reduced diffusion within the left mesial temporal lobe, axial 14/60. Corresponding low ADC values. 7 mm focus of reduced diffusion within the left periventricular frontal white matter, axial 20/60 with corresponding low ADC values. 3 mm focus of reduced diffusion within the left superior cerebellum, with corresponding low ADC values.  Moderately motion degraded gradient sequence, with scattered subcentimeter foci of susceptibility artifact within the supra and infratentorial brain, better seen on prior less motion degraded MRI. No convincing evidence of new micro hemorrhages. No midline shift or mass effect.  Moderate ventriculomegaly, likely on the basis of global parenchymal brain volume loss as there is overall commensurate enlargement cerebral sulci and cerebellar folia. Severe white matter changes, including pontine T2 hyperintensities are similar. Remote bilateral basal ganglia, thalamic and cerebellar infarcts. No midline shift or mass effect.  No abnormal extra-axial fluid collections. Status post bilateral ocular lens implants. Patient is edentulous. No abnormal sellar expansion. No cerebellar tonsillar ectopia.  MRA HEAD FINDINGS  Anterior circulation: Mildly motion degraded evaluation limits the sensitivity.  Anterior circulation: Normal flow related enhancement of the included cervical, petrous, cavernous internal carotid arteries. However, suspected at least 50% stenosis of the left anterior genu of the internal carotid artery corresponding to coarse calcification on prior head CT. Finding may be accentuated by patient motion. Normal flow related enhancement of the supra clinoid internal carotid arteries, the right is more robust. Patent flow related enhancement of the anterior and middle cerebral arteries. At least 50% narrowing of the origin of a left M2 branch similar to worse.  Posterior circulation: Motion degrades evaluation. Thready  flow of the left vertebral artery throughout the intradural segment, similar to prior examination. Basilar artery is patent, patent main branch vessels. Posterior communicating arteries are not distinctly identified. Focal high-grade stenosis of the right P2 segment, axial 97/160. Normal flow within the enhancement of the P3 segments.  No convincing evidence of aneurysm. No large vessel occlusion. Mild irregularity of the intracranial vessels diffusely most consistent with atherosclerosis.  IMPRESSION: MRI head: Left mesial temporal lobe, left periventricular frontal white matter and left superior cerebellar subcentimeter foci of acute ischemia. These span multiple vascular territories, and may reflect thromboembolic disease without acute large territory infarct.  Moderately motion degraded examination. Scattered susceptibility artifact most consistent with sequelae of chronic hypertension, with multiple remote basal  ganglia/thalamic and cerebellar infarcts. Severe white matter changes suggest chronic small vessel ischemic disease.  MRA head: Motion degraded examination. Multifocal intracranial mid to high-grade stenosis, including anterior genu of the left internal carotid artery, left M2, right P2 and left distal vertebral arteries, relatively unchanged for this motion degraded examination.  Diffuse mild intracranial luminal irregularity compatible with atherosclerosis.   Electronically Signed   By: Awilda Metroourtnay  Bloomer   On: 03/22/2014 01:04   Echocardiogram  01-10-2014 EF 60-65%, no RWMA, LA 33  12-lead ECG sinus rhythm, rate 74, normal intervals Telemetry sinus rhythm with no arrhythmias   Assessment and Plan 1. Cryptogenic stroke    If the TEE is negative, we recommend loop recorder insertion to monitor for AF. The indication for loop recorder insertion / monitoring for AF in setting of cryptogenic stroke was discussed with the patient. The loop recorder insertion procedure was reviewed in detail  including risks and benefits. These risks include but are not limited to bleeding and infection. The patient expressed verbal understanding and agrees to proceed. The patient was also counseled regarding wound care and device follow-up.  w positive TEE for PFO  Will order venous dopplers  Signed,

## 2014-03-23 NOTE — Progress Notes (Signed)
CARE MANAGEMENT NOTE 03/23/2014  Patient:  [name] [name],[name] [name]   Account Number:  [id number]  Date Initiated:  03/23/2014  Documentation initiated by:  [name] CrockerHANDLER,[name] [name]  Subjective/Objective Assessment:   ADMITTED FOR STROKE WORKUP     Action/Plan:   CM FOLLOWING FOR DCP   Anticipated DC Date:  03/27/2014   Anticipated DC Plan: POSSIBLY IP REHAB FACILITY      DC Planning Services  CM consult           Status of service:  In process, will continue to follow Medicare Important Message given?   (If response is "NO", the following Medicare IM given date fields will be blank)  Per UR Regulation:  Reviewed for med. necessity/level of care/duration of stay  Comments:  5/18/2015Abelino [name]- B [name] Fowers RN,BSN,MHA [phone]

## 2014-03-23 NOTE — Progress Notes (Signed)
Pt still at endo

## 2014-03-23 NOTE — Progress Notes (Signed)
Inpatient Rehabilitation  I met with the patient and her husband at the bedside today to discuss her post acute rehab options.  She had limited ability to attend to the conversation due to sleepiness, but stated several times "I want to go where my insurance will pay".  Her husband was present but not engaged in  My attempted  Conversation with him.  He could not offer any preference for his wife's post acute rehab, and in fact, was unclear of what meaning of the term rehab implies.  I am happy to submit for insurance authorization through [facility] but I anticipate we will receive a denial.  Discussed with Olga Coaster, CM.  Please call if questions.  [place] Admissions Coordinator Cell [phone] Office [phone]

## 2014-03-23 NOTE — Evaluation (Addendum)
Occupational Therapy Evaluation Patient Details Name: Erin FullingBetty J Walters MRN: [id number] DOB: 25-Apr-1941 Today's Date: 03/23/2014    History of Present Illness Erin FullingBetty J Clear is a [age] y.o. female with a history of hypertension, hyperlipidemia, diabetes mellitus and stroke in March 2015, presenting with exacerbation of left-sided weakness as well as slurred speech. CT scan of her head showed no acute intracranial abnormality.  Pt legally blind.   Clinical Impression   Pt presents with below problem list. Feel pt will benefit from acute OT to increase independence prior to d/c. Recommending CIR for additional rehab prior to d/c home.     Follow Up Recommendations  CIR;Supervision/Assistance - 24 hour    Equipment Recommendations  Other (comment) (defer to next venue)    Recommendations for Other Services       Precautions / Restrictions Precautions Precautions: Fall Precaution Comments: chair pad when up Restrictions Weight Bearing Restrictions: No      Mobility Bed Mobility Overal bed mobility: Needs Assistance Bed Mobility: Supine to Sit;Sit to Supine     Supine to sit: Supervision Sit to supine: Min assist   General bed mobility comments: Assistance with LE. Cues to adjust in bed.  Transfers Overall transfer level: Needs assistance Equipment used: Rolling walker (2 wheeled) Transfers: Sit to/from Stand Sit to Stand: Min guard;Min assist         General transfer comment: cues for technique. OT helped pt position RUE on BSC when going to sit on chair.    Balance                                            ADL Overall ADL's : Needs assistance/impaired Eating/Feeding: Supervision/ safety;Sitting;Set up   Grooming: Wash/dry face;Oral care;Standing;Applying deodorant;Sitting;Minimal assistance   Upper Body Bathing: Supervision/ safety;Set up;Sitting (washed armpits)   Lower Body Bathing: Min guard;Sit to/from stand       Lower Body Dressing: Min  guard;Sit to/from stand   Toilet Transfer: Moderate assistance;Ambulation;BSC;RW   Toileting- ArchitectClothing Manipulation and Hygiene: Min guard;Sit to/from stand       Functional mobility during ADLs: Moderate assistance;Rolling walker General ADL Comments: Cues to try to get pt to stand upright at sink as she was leaning over sink for some grooming tasks. Pt able to don/doff socks sitting EOB. Pt ambulated with OT.      Vision                     Perception     Praxis      Pertinent Vitals/Pain Pain in ankle, but not rated. Repositioned pt at end of session.      Hand Dominance Right   Extremity/Trunk Assessment Upper Extremity Assessment Upper Extremity Assessment: LUE deficits/detail;Generalized weakness LUE Sensation: decreased light touch   Lower Extremity Assessment Lower Extremity Assessment: Defer to PT evaluation       Communication Communication Communication: No difficulties   Cognition Arousal/Alertness: Awake/alert Behavior During Therapy: WFL for tasks assessed/performed Overall Cognitive Status: Within Functional Limits for tasks assessed                     General Comments       Exercises       Shoulder Instructions      Home Living Family/patient expects to be discharged to:: Private residence Living Arrangements: Alone Available Help at Discharge: Family;Available 24 hours/day  Type of Home: House Home Access: Ramped entrance     Home Layout: One level               Home Equipment: Walker - 2 wheels;Shower seat;Electric scooter          Prior Functioning/Environment Level of Independence: Needs assistance  Gait / Transfers Assistance Needed: Assist to transfer and for ambulation with RW ADL's / Homemaking Assistance Needed: Assist for bathing/dressing and meal prep. Pt able to don shorts/undewear.        OT Diagnosis: Acute pain;Generalized weakness   OT Problem List: Impaired vision/perception;Decreased  knowledge of use of DME or AE;Decreased knowledge of precautions;Pain;Decreased strength;Decreased activity tolerance   OT Treatment/Interventions: Self-care/ADL training;DME and/or AE instruction;Therapeutic activities;Patient/family education;Balance training;Visual/perceptual remediation/compensation    OT Goals(Current goals can be found in the care plan section) Acute Rehab OT Goals Patient Stated Goal: not stated OT Goal Formulation: With patient Time For Goal Achievement: 03/30/14 Potential to Achieve Goals: Good ADL Goals Pt Will Perform Grooming: with set-up;standing Pt Will Perform Lower Body Dressing: with set-up;sit to/from stand;with supervision Pt Will Transfer to Toilet: with supervision;ambulating Pt Will Perform Toileting - Clothing Manipulation and hygiene: with supervision;sit to/from stand  OT Frequency: Min 2X/week   Barriers to D/C:            Co-evaluation              End of Session Equipment Utilized During Treatment: Gait belt;Rolling walker  Activity Tolerance: Patient tolerated treatment well Patient left: in bed;with family/visitor present;with bed alarm set (Pt's spouse had call bell)   Time: [id number] OT Time Calculation (min): 27 min Charges:  OT General Charges $OT Visit: 1 Procedure OT Evaluation $Initial OT Evaluation Tier I: 1 Procedure OT Treatments $Self Care/Home Management : 8-22 mins G-Codes:    Earlie RavelingLindsey L Chayton Murata OTR/L [phone] 03/23/2014, 1:26 PM

## 2014-03-23 NOTE — Interval H&P Note (Signed)
History and Physical Interval Note:  03/23/2014 2:15 PM  [name] [name]  has presented today for surgery, with the diagnosis of stroke  The various methods of treatment have been discussed with the patient and family. After consideration of risks, benefits and other options for treatment, the patient has consented to  Procedure(s): TRANSESOPHAGEAL ECHOCARDIOGRAM (TEE) (N/A) as a surgical intervention .  The patient's history has been reviewed, patient examined, no change in status, stable for surgery.  I have reviewed the patient's chart and labs.  Questions were answered to the patient's satisfaction.     Lars MassonKatarina H Efstathios Sawin

## 2014-03-23 NOTE — Consult Note (Addendum)
Physical Medicine and Rehabilitation Consult Reason for Consult: History CVA Referring Physician: [facility]   HPI: [name] [name] is a 73 y.o. 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 Presented 03/21/2014 with left-sided weakness and hypoglycemia as well as elevated blood pressure 223/69. She did require a amp of D50 while in the ED. MRI of the brain shows left mesial temporal lobe, left periventricular frontal white matter and left superior cerebellar subcentimeter foci of acute ischemia as well as multiple remote basal ganglia thalamic and cerebellar infarcts. MRA of the head with multifocal intracranial to high-grade stenosis, including anterior genu of the left internal carotid artery. Patient did not receive TPA. Neurology services consulted with further workup ongoing. She presently remains on Plavix for CVA prophylaxis as well as subcutaneous Lovenox for DVT prophylaxis. Maintained on a mechanical soft thin liquid diet. Physical therapy evaluation completed 03/22/2014 with recommendations for physical medicine rehabilitation consult.   Review of Systems  Gastrointestinal: Positive for constipation.       GERD  Musculoskeletal: Positive for falls and myalgias.  All other systems reviewed and are negative.  Past Medical History  Diagnosis Date  . Hypertension   . Diabetes mellitus   . Stroke   . Chronic renal insufficiency   . Hyperlipidemia   . Osteopenia   . CVA (cerebral infarction)    Past Surgical History  Procedure Laterality Date  . Eye surgery     Family History  Problem Relation Age of Onset  . Diabetes Mother   . Hypertension Mother    Social History:  reports that she has been smoking Cigarettes.  She has a 30 pack-year smoking  history. She does not have any smokeless tobacco history on file. She reports that she drinks alcohol. She reports that she does not use illicit drugs. Allergies:  Allergies  Allergen Reactions  . Erythromycin Other (See Comments)    unknown  . Iodine     REACTION: hives  . Penicillins     REACTION: hives   Medications Prior to Admission  Medication Sig Dispense Refill  . atorvastatin (LIPITOR) 20 MG tablet Take 1 tablet (20 mg total) by mouth daily at 6 PM.  31 tablet  0  . Cholecalciferol (VITAMIN D3) 2000 UNITS TABS Take 2,000 Units by mouth daily.      . diphenhydrAMINE (BENADRYL) 25 MG tablet Take 25 mg by mouth every 6 (six) hours as needed for allergies.      . furosemide (LASIX) 20 MG tablet Take 40 mg by mouth daily.       Marland Kitchen. gabapentin (NEURONTIN) 300 MG capsule Take 300 mg by mouth 2 (two) times daily.       Marland Kitchen. glimepiride (AMARYL) 4 MG tablet Take 4 mg by mouth 2 (two) times daily.       . hydrochlorothiazide (HYDRODIURIL) 25 MG tablet Take 25 mg by mouth daily.      . insulin NPH-regular (NOVOLIN 70/30) (70-30) 100 UNIT/ML injection Inject 40 Units into the skin daily as needed. Blood sugar >200 take insulin      . levothyroxine (SYNTHROID, LEVOTHROID) 50 MCG tablet Take 50 mcg by mouth daily.      Marland Kitchen. lisinopril (PRINIVIL,ZESTRIL) 20 MG tablet Take 20 mg by mouth daily.      Marland Kitchen. LUMIGAN 0.01 % SOLN  Place 1 drop into both eyes at bedtime.       . meloxicam (MOBIC) 7.5 MG tablet Take 7.5 mg by mouth daily.      . metFORMIN (GLUCOPHAGE) 1000 MG tablet Take 1,000 mg by mouth 2 (two) times daily with a meal.      . metoprolol succinate (TOPROL-XL) 25 MG 24 hr tablet Take 25 mg by mouth 2 (two) times daily.      . naproxen (NAPROSYN) 500 MG tablet Take 1 tablet by mouth 2 (two) times daily as needed for mild pain.       Marland Kitchen omeprazole (PRILOSEC) 40 MG capsule Take 40 mg by mouth daily.      Marland Kitchen PATADAY 0.2 % SOLN Place 1 drop into both eyes daily.       . potassium chloride SA  (K-DUR,KLOR-CON) 20 MEQ tablet Take 20 mEq by mouth 2 (two) times daily.        Home: Home Living Family/patient expects to be discharged to:: Private residence Living Arrangements: Spouse/significant other Available Help at Discharge: Family;Available 24 hours/day Type of Home: House Home Access: Ramped entrance Home Layout: One level Home Equipment: Walker - 2 wheels;Shower seat;Electric scooter  Functional History: Prior Function Level of Independence: Needs assistance Gait / Transfers Assistance Needed: Assist to transfer and for ambulation with RW ADL's / Homemaking Assistance Needed: Assist for bathing/dressing and meal prep. Pt able to don shorts/undewear. Comments: Patient legally blind Functional Status:  Mobility: Bed Mobility Overal bed mobility: Needs Assistance Bed Mobility: Supine to Sit Supine to sit: Supervision;HOB elevated General bed mobility comments: HOB ~20 degree and pt uses rail, incr time, able to advance to EOB without assist but slighly impulsive Transfers Overall transfer level: Needs assistance Equipment used: Rolling walker (2 wheeled) Transfers: Sit to/from Stand Sit to Stand: Min assist General transfer comment: needs cues for safest hands on surface not RW and instruction due to mildly impulsive Ambulation/Gait Ambulation/Gait assistance: Min assist Ambulation Distance (Feet): 10 Feet Assistive device: Rolling walker (2 wheeled) Gait Pattern/deviations: Step-through pattern;Decreased stride length;Shuffle;Trunk flexed Gait velocity: very slow, not timed this assess, limited space Gait velocity interpretation: <1.8 ft/sec, indicative of risk for recurrent falls    ADL:    Cognition: Cognition Overall Cognitive Status: Impaired/Different from baseline Orientation Level: Oriented X4 Cognition Arousal/Alertness: Lethargic (arousable, able to maintain alert throughout assessment) Behavior During Therapy: WFL for tasks  assessed/performed Overall Cognitive Status: Impaired/Different from baseline Area of Impairment: Orientation Orientation Level: Disoriented to;Time;Place (when asked, knows in hospital, but conversationally disorien) General Comments: as noted, able to state time/place when asked but in conversation, gets confused about instructions for now in hospital vs. when in home (ie call for help to get back to bed NOW with call bell for nurse, etc)  Blood pressure 184/64, pulse 91, temperature 99.3 F (37.4 C), temperature source Oral, resp. rate 18, SpO2 96.00%. Physical Exam  Vitals reviewed. Eyes: EOM are normal.  Neck: Normal range of motion. Neck supple. No thyromegaly present.  Cardiovascular: Normal rate and regular rhythm.   Respiratory: Effort normal and breath sounds normal. No respiratory distress.  GI: Soft. Bowel sounds are normal. She exhibits no distension.  Neurological:  Patient was alert. She was able to provide her age and date of birth. She would keep her eyes closed during exam. Follows simple commands. Limited awareness of her deficits and she continued to request discharge to home. Right sided weakness, inconsistent effort. Senses pain on all 4's  Skin: Skin is warm  and dry.    Results for orders placed during the hospital encounter of 03/20/14 (from the past 24 hour(s))  GLUCOSE, CAPILLARY     Status: Abnormal   Collection Time    03/22/14  6:39 AM      Result Value Ref Range   Glucose-Capillary 223 (*) 70 - 99 mg/dL   Comment 1 Documented in Chart     Comment 2 Notify RN    BASIC METABOLIC PANEL     Status: Abnormal   Collection Time    03/22/14  6:57 AM      Result Value Ref Range   Sodium 141  137 - 147 mEq/L   Potassium 4.0  3.7 - 5.3 mEq/L   Chloride 101  96 - 112 mEq/L   CO2 28  19 - 32 mEq/L   Glucose, Bld 249 (*) 70 - 99 mg/dL   BUN 18  6 - 23 mg/dL   Creatinine, Ser 2.13  0.50 - 1.10 mg/dL   Calcium 9.0  8.4 - 08.6 mg/dL   GFR calc non Af Amer 49 (*)  >90 mL/min   GFR calc Af Amer 57 (*) >90 mL/min  GLUCOSE, CAPILLARY     Status: Abnormal   Collection Time    03/22/14 11:59 AM      Result Value Ref Range   Glucose-Capillary 276 (*) 70 - 99 mg/dL   Comment 1 Notify RN     Comment 2 Documented in Chart    GLUCOSE, CAPILLARY     Status: Abnormal   Collection Time    03/22/14  5:28 PM      Result Value Ref Range   Glucose-Capillary 283 (*) 70 - 99 mg/dL   Comment 1 Notify RN     Comment 2 Documented in Chart    GLUCOSE, CAPILLARY     Status: Abnormal   Collection Time    03/22/14  9:32 PM      Result Value Ref Range   Glucose-Capillary 147 (*) 70 - 99 mg/dL   Dg Chest 2 View  5/78/4696   CLINICAL DATA:  Stroke symptoms with history of hypertension and diabetes  EXAM: CHEST  2 VIEW  COMPARISON:  DG CHEST 2 VIEW dated 01/10/2014  FINDINGS: The lungs are adequately inflated and clear. The cardiopericardial silhouette is normal in size. The pulmonary vascularity is not engorged. The mediastinum is normal in width. There is no pleural effusion. The observed portions of the bony thorax appear normal.  IMPRESSION: There is no evidence of pneumonia nor CHF. Mild hyperinflation may be voluntary or could reflect underlying COPD.   Electronically Signed   By: David  [place]   On: 03/22/2014 00:54   Mr Brain Wo Contrast  03/22/2014   CLINICAL DATA:  Evaluate stroke, hypoglycemia, intermittent slurred speech.  EXAM: MRI HEAD WITHOUT CONTRAST  MRA HEAD WITHOUT CONTRAST  TECHNIQUE: Multiplanar, multiecho pulse sequences of the brain and surrounding structures were obtained without intravenous contrast. Angiographic images of the head were obtained using MRA technique without contrast.  COMPARISON:  None.  MR HEAD W/O CM dated 01/09/2014; CT HEAD W/O CM dated 03/20/2014; CT HEAD W/O CM dated 01/14/2014  FINDINGS: MRI HEAD FINDINGS  Punctate focus of reduced diffusion within the left mesial temporal lobe, axial 14/60. Corresponding low ADC values. 7 mm focus of  reduced diffusion within the left periventricular frontal white matter, axial 20/60 with corresponding low ADC values. 3 mm focus of reduced diffusion within the left superior cerebellum, with corresponding  low ADC values.  Moderately motion degraded gradient sequence, with scattered subcentimeter foci of susceptibility artifact within the supra and infratentorial brain, better seen on prior less motion degraded MRI. No convincing evidence of new micro hemorrhages. No midline shift or mass effect.  Moderate ventriculomegaly, likely on the basis of global parenchymal brain volume loss as there is overall commensurate enlargement cerebral sulci and cerebellar folia. Severe white matter changes, including pontine T2 hyperintensities are similar. Remote bilateral basal ganglia, thalamic and cerebellar infarcts. No midline shift or mass effect.  No abnormal extra-axial fluid collections. Status post bilateral ocular lens implants. Patient is edentulous. No abnormal sellar expansion. No cerebellar tonsillar ectopia.  MRA HEAD FINDINGS  Anterior circulation: Mildly motion degraded evaluation limits the sensitivity.  Anterior circulation: Normal flow related enhancement of the included cervical, petrous, cavernous internal carotid arteries. However, suspected at least 50% stenosis of the left anterior genu of the internal carotid artery corresponding to coarse calcification on prior head CT. Finding may be accentuated by patient motion. Normal flow related enhancement of the supra clinoid internal carotid arteries, the right is more robust. Patent flow related enhancement of the anterior and middle cerebral arteries. At least 50% narrowing of the origin of a left M2 branch similar to worse.  Posterior circulation: Motion degrades evaluation. Thready flow of the left vertebral artery throughout the intradural segment, similar to prior examination. Basilar artery is patent, patent main branch vessels. Posterior communicating  arteries are not distinctly identified. Focal high-grade stenosis of the right P2 segment, axial 97/160. Normal flow within the enhancement of the P3 segments.  No convincing evidence of aneurysm. No large vessel occlusion. Mild irregularity of the intracranial vessels diffusely most consistent with atherosclerosis.  IMPRESSION: MRI head: Left mesial temporal lobe, left periventricular frontal white matter and left superior cerebellar subcentimeter foci of acute ischemia. These span multiple vascular territories, and may reflect thromboembolic disease without acute large territory infarct.  Moderately motion degraded examination. Scattered susceptibility artifact most consistent with sequelae of chronic hypertension, with multiple remote basal ganglia/thalamic and cerebellar infarcts. Severe white matter changes suggest chronic small vessel ischemic disease.  MRA head: Motion degraded examination. Multifocal intracranial mid to high-grade stenosis, including anterior genu of the left internal carotid artery, left M2, right P2 and left distal vertebral arteries, relatively unchanged for this motion degraded examination.  Diffuse mild intracranial luminal irregularity compatible with atherosclerosis.   Electronically Signed   By: Awilda Metroourtnay  Bloomer   On: 03/22/2014 01:04   Mr Maxine GlennMra Head/brain Wo Cm  03/22/2014   CLINICAL DATA:  Evaluate stroke, hypoglycemia, intermittent slurred speech.  EXAM: MRI HEAD WITHOUT CONTRAST  MRA HEAD WITHOUT CONTRAST  TECHNIQUE: Multiplanar, multiecho pulse sequences of the brain and surrounding structures were obtained without intravenous contrast. Angiographic images of the head were obtained using MRA technique without contrast.  COMPARISON:  None.  MR HEAD W/O CM dated 01/09/2014; CT HEAD W/O CM dated 03/20/2014; CT HEAD W/O CM dated 01/14/2014  FINDINGS: MRI HEAD FINDINGS  Punctate focus of reduced diffusion within the left mesial temporal lobe, axial 14/60. Corresponding low ADC values.  7 mm focus of reduced diffusion within the left periventricular frontal white matter, axial 20/60 with corresponding low ADC values. 3 mm focus of reduced diffusion within the left superior cerebellum, with corresponding low ADC values.  Moderately motion degraded gradient sequence, with scattered subcentimeter foci of susceptibility artifact within the supra and infratentorial brain, better seen on prior less motion degraded MRI. No convincing evidence of new  micro hemorrhages. No midline shift or mass effect.  Moderate ventriculomegaly, likely on the basis of global parenchymal brain volume loss as there is overall commensurate enlargement cerebral sulci and cerebellar folia. Severe white matter changes, including pontine T2 hyperintensities are similar. Remote bilateral basal ganglia, thalamic and cerebellar infarcts. No midline shift or mass effect.  No abnormal extra-axial fluid collections. Status post bilateral ocular lens implants. Patient is edentulous. No abnormal sellar expansion. No cerebellar tonsillar ectopia.  MRA HEAD FINDINGS  Anterior circulation: Mildly motion degraded evaluation limits the sensitivity.  Anterior circulation: Normal flow related enhancement of the included cervical, petrous, cavernous internal carotid arteries. However, suspected at least 50% stenosis of the left anterior genu of the internal carotid artery corresponding to coarse calcification on prior head CT. Finding may be accentuated by patient motion. Normal flow related enhancement of the supra clinoid internal carotid arteries, the right is more robust. Patent flow related enhancement of the anterior and middle cerebral arteries. At least 50% narrowing of the origin of a left M2 branch similar to worse.  Posterior circulation: Motion degrades evaluation. Thready flow of the left vertebral artery throughout the intradural segment, similar to prior examination. Basilar artery is patent, patent main branch vessels. Posterior  communicating arteries are not distinctly identified. Focal high-grade stenosis of the right P2 segment, axial 97/160. Normal flow within the enhancement of the P3 segments.  No convincing evidence of aneurysm. No large vessel occlusion. Mild irregularity of the intracranial vessels diffusely most consistent with atherosclerosis.  IMPRESSION: MRI head: Left mesial temporal lobe, left periventricular frontal white matter and left superior cerebellar subcentimeter foci of acute ischemia. These span multiple vascular territories, and may reflect thromboembolic disease without acute large territory infarct.  Moderately motion degraded examination. Scattered susceptibility artifact most consistent with sequelae of chronic hypertension, with multiple remote basal ganglia/thalamic and cerebellar infarcts. Severe white matter changes suggest chronic small vessel ischemic disease.  MRA head: Motion degraded examination. Multifocal intracranial mid to high-grade stenosis, including anterior genu of the left internal carotid artery, left M2, right P2 and left distal vertebral arteries, relatively unchanged for this motion degraded examination.  Diffuse mild intracranial luminal irregularity compatible with atherosclerosis.   Electronically Signed   By: Awilda Metro   On: 03/22/2014 01:04    Assessment/Plan: Diagnosis: embolic left brain infarcts 1. Does the need for close, 24 hr/day medical supervision in concert with the patient's rehab needs make it unreasonable for this patient to be served in a less intensive setting? Yes 2. Co-Morbidities requiring supervision/potential complications: htn, dm2, pvd 3. Due to bladder management, bowel management, safety, skin/wound care, disease management, medication administration, pain management and patient education, does the patient require 24 hr/day rehab nursing? Yes 4. Does the patient require coordinated care of a physician, rehab nurse, PT (1-2 hrs/day, 5  days/week), OT (1-2 hrs/day, 5 days/week) and SLP (1-2 hrs/day, 5 days/week) to address physical and functional deficits in the context of the above medical diagnosis(es)? Yes Addressing deficits in the following areas: balance, endurance, locomotion, strength, transferring, bowel/bladder control, bathing, dressing, feeding, grooming, toileting, cognition and psychosocial support 5. Can the patient actively participate in an intensive therapy program of at least 3 hrs of therapy per day at least 5 days per week? Yes 6. The potential for patient to make measurable gains while on inpatient rehab is excellent 7. Anticipated functional outcomes upon discharge from inpatient rehab are modified independent  with PT, modified independent and supervision with OT, modified independent and supervision  with SLP. 8. Estimated rehab length of stay to reach the above functional goals is: 8-12 days 9. Does the patient have adequate social supports to accommodate these discharge functional goals? Yes 10. Anticipated D/C setting: Home 11. Anticipated post D/C treatments: HH therapy and Outpatient therapy 12. Overall Rehab/Functional Prognosis: excellent  RECOMMENDATIONS: This patient's condition is appropriate for continued rehabilitative care in the following setting: CIR Patient has agreed to participate in recommended program. Potentially  Note that insurance prior authorization may be required for reimbursement for recommended care.  Comment: Rehab Admissions Coordinator to follow up. Not convinced that pt is willing to come to inpatient rehab.  Thanks,  Ranelle Oyster, MD, [place] [facility]     03/23/2014

## 2014-03-23 NOTE — Progress Notes (Signed)
The patient is scheduled for TEE at 1400hrs today with Dr. Delton SeeNelson.  Wilburt FinlayBryan Heavyn Yearsley, PA-C

## 2014-03-23 NOTE — Progress Notes (Signed)
Stroke Team Progress Note  HISTORY [name] [name] is a 73 y.o. female with a history of hypertension, hyperlipidemia, diabetes mellitus and stroke in March 2015, presenting with exacerbation of left-sided weakness as well as slurred speech. Onset was at 9 PM on 03/20/2014. She was also noted to be hypoglycemic with blood sugar 42. She was given one amp of D50. Repeat blood sugar was 148. Weakness of the left side was persistent, particularly involving her left leg. CT scan of her head showed no acute intracranial abnormality. Patient has been on Plavix for antiplatelet therapy. NIH stroke score was 4.   tPA Given: No: Recent stroke (01/09/2014).   SUBJECTIVE Patient's husband is at the bedside.  OBJECTIVE Most recent Vital Signs: Filed Vitals:   03/22/14 2129 03/23/14 0126 03/23/14 0524 03/23/14 1031  BP: 166/53 182/62 184/64 177/48  Pulse: 78 82 91 82  Temp: 98.6 F (37 C) 98.8 F (37.1 C) 99.3 F (37.4 C) 99.2 F (37.3 C)  TempSrc: Oral Oral Oral Oral  Resp: 18 18 18 18   SpO2: 98% 98% 96% 96%   CBG (last 3)   Recent Labs  03/22/14 1728 03/22/14 2132 03/23/14 0654  GLUCAP 283* 147* 213*    IV Fluid Intake:   . sodium chloride      MEDICATIONS  . atorvastatin  40 mg Oral q1800  . benazepril  10 mg Oral Daily  . cholecalciferol  2,000 Units Oral Daily  . clopidogrel  75 mg Oral Q breakfast  . enoxaparin (LOVENOX) injection  40 mg Subcutaneous Daily  . fluticasone  1 spray Each Nare Daily  . gabapentin  300 mg Oral BID  . hydrochlorothiazide  25 mg Oral Daily  . insulin aspart  0-9 Units Subcutaneous TID WC  . insulin aspart protamine- aspart  15 Units Subcutaneous BID WC  . levothyroxine  50 mcg Oral QAC breakfast  . loratadine  10 mg Oral Daily  . metoprolol succinate  25 mg Oral BID  . pantoprazole  80 mg Oral Daily  . pneumococcal 23 valent vaccine  0.5 mL Intramuscular Tomorrow-1000   PRN:  acetaminophen, albuterol, diphenhydrAMINE, hydrALAZINE,  senna-docusate  Diet:  NPO for TEE Activity:  Up with assistance DVT Prophylaxis:  Lovenox  CLINICALLY SIGNIFICANT STUDIES Basic Metabolic Panel:   Recent Labs Lab 03/21/14 0440 03/22/14 0657  NA 142 141  K 3.5* 4.0  CL 101 101  CO2 31 28  GLUCOSE 47* 249*  BUN 26* 18  CREATININE 1.35* 1.08  CALCIUM 8.7 9.0   Liver Function Tests:   Recent Labs Lab 03/20/14 2156  AST 15  ALT 7  ALKPHOS 62  BILITOT <0.2*  PROT 7.4  ALBUMIN 3.6   CBC:   Recent Labs Lab 03/20/14 2156 03/21/14 0440  WBC 6.4 5.7  NEUTROABS 3.2  --   HGB 11.9* 10.4*  HCT 34.3* 30.7*  MCV 88.2 87.7  PLT 256 242   Coagulation:   Recent Labs Lab 03/20/14 2156  LABPROT 13.3  INR 1.03   Cardiac Enzymes: No results found for this basename: CKTOTAL, CKMB, CKMBINDEX, TROPONINI,  in the last 168 hours Urinalysis:   Recent Labs Lab 03/20/14 2236  COLORURINE YELLOW  LABSPEC 1.009  PHURINE 6.5  GLUCOSEU 250*  HGBUR NEGATIVE  BILIRUBINUR NEGATIVE  KETONESUR NEGATIVE  PROTEINUR 30*  UROBILINOGEN 0.2  NITRITE NEGATIVE  LEUKOCYTESUR NEGATIVE   Lipid Panel    Component Value Date/Time   CHOL 186 03/21/2014 0440   TRIG 150* 03/21/2014 0440  HDL 48 03/21/2014 0440   CHOLHDL 3.9 03/21/2014 0440   VLDL 30 03/21/2014 0440   LDLCALC 108* 03/21/2014 0440   HgbA1C  Lab Results  Component Value Date   HGBA1C 10.1* 03/21/2014    Urine Drug Screen:     Component Value Date/Time   LABOPIA NONE DETECTED 01/09/2014 2054   COCAINSCRNUR NONE DETECTED 01/09/2014 2054   LABBENZ NONE DETECTED 01/09/2014 2054   AMPHETMU NONE DETECTED 01/09/2014 2054   THCU NONE DETECTED 01/09/2014 2054   LABBARB NONE DETECTED 01/09/2014 2054    Alcohol Level: No results found for this basename: ETH,  in the last 168 hours  Ct Head (brain) Wo Contrast 03/20/2014    1. There is no evidence of an acute ischemic infarction. There are old lacunar infarctions in both cerebral hemispheres, and there is decreased density in the deep  white matter of both cerebral hemispheres consistent with chronic small vessel ischemia.  2. There is no acute intracranial hemorrhage.  3. There are stable basal ganglia calcifications bilaterally.   MRI head  Left mesial temporal lobe, left periventricular frontal white matter and left superior cerebellar subcentimeter foci of acute ischemia. These span multiple vascular territories, and may reflect thromboembolic disease without acute large territory infarct.  Moderately motion degraded examination. Scattered susceptibility artifact most consistent with sequelae of chronic hypertension, with multiple remote basal ganglia/thalamic and cerebellar infarcts.Severe white matter changes suggest chronic small vessel ischemic disease.  MRA head Motion degraded examination. Multifocal intracranial mid to high-grade stenosis, including anterior genu of the left internal carotid artery, left M2, right P2 and left distal vertebral arteries, relatively unchanged for this motion degraded examination. Diffuse mild intracranial luminal irregularity compatible with atherosclerosis.  2D Echocardiogram  01/10/2014 ejection fraction 60-65%. No cardiac source of emboli identified.  Carotid Doppler 01/11/2014  Right: Mild focal calcific plaque distal CCA and origin ICA. Left: Moderate calcific plaque with acoustic shadowing origin ICA.  Bilateral: 1-39% ICA stensois. Vertebral artery flow is antegrade.  CXR  There is no evidence of pneumonia nor CHF. Mild hyperinflation may be voluntary or could reflect underlying COPD.  EKG  Sinus rhythm rate 74 beats per minute. For complete results please see formal report.   Therapy Recommendations rehabilitation consult recommend for inpatient rehabilitation  Physical Exam   Neurologic Examination:  Mental Status:  Lethargic but arousable, oriented, thought content appropriate. Speech slightly slurred without evidence of aphasia. Able to follow most commands with  prompting. Poorly cooperative with exam. Cranial Nerves:  II-difficult to assess due to the poor visual acuity, including with glasses.  III/IV/VI-Pupils were equal and reacted. Extraocular movements were full and conjugate.  V/VII-slight left facial numbness to tactile sensation; mild left lower facial weakness.  VIII-normal.  X- mild dysarthria.  Motor: Moderately severe weakness of left lower extremity proximally; mild weakness of the left upper extremity.  Appears abnormal strength of the right upper and lower extremities; flaccid muscle tone throughout.  Sensory: Reduced perception of tactile sensation over left extremities compared to right extremities.  Deep Tendon Reflexes: Trace to 1+ and symmetric.  Plantars: Mute bilaterally  Cerebellar: Difficult to assess because of her poor vision.   ASSESSMENT Ms. Erin FullingBetty J Guimaraes is a 73 y.o. female presenting with left hemiparesis and dysarthria. MRI confirms left mesial temporal lobe, left periventricular frontal white matter and left superior cerebellar small infarct in setting pf multiple remote basal ganglia/thalamic and cerebellar infarcts. Given new, multi-vascular territories in setting of old strokes if other territories, concern for embolic  source. Patient does have small vessel ischemic disease as etiology of old lacunar infarcts. On clopidogrel 75 mg orally every day prior to admission. Now on clopidogrel 75 mg orally every day for secondary stroke prevention. Patient with resultant left hemiplegia and left hemisensory deficits. Stroke work up underway.   Hyperlipidemia - Lipitor prior to admission. Cholesterol 186 LDL 108.  Diabetes mellitus - hemoglobin A1c 10.4 in March 2015. Goal less than 7  Hypoglycemic on admission with glucose in the 40s.  Hypertension  Chronic renal insufficiency, CKD stage III  Anemia  Hx left periatrial white matter secondary to small vessel disease March 2015   Hx cerebellar stroke  Legally  blind  PVD  Hospital day # 3  TREATMENT/PLAN  Continue clopidogrel 75 mg orally every day for secondary stroke prevention. TEE to look for embolic source. Arranged for this afternoon. If positive for PFO (patent foramen ovale), check bilateral lower extremity venous dopplers to rule out DVT as possible source of stroke. (I have made patient NPO after midnight tonight).  Loop  Recorder implant If TEE negative, a [facility] electrophysiologist will consult and consider placement of an place implantable loop recorder to evaluate for atrial fibrillation as etiology of stroke. This has been explained to patient/family by Dr. Pearlean BrownieSethi and they are agreeable. I alerted Trish.  Risk factor modification. Needs better glucose control.  Dispo:  IP Rehabilitation recommended by therapy; PM&R agrees  Patient is not medically ready for discharge today. IP rehab will be necessary prior to discharge per Dr. Pearlean BrownieSethi.   Annie MainSHARON BIBY, MSN, RN, ANVP-BC, ANP-BC, Lawernce IonGNP-BC [facility] Pager: [phone] 03/23/2014 12:52 PM  I have personally obtained a history, examined the patient, evaluated imaging results, and formulated the assessment and plan of care. I agree with the above.  Delia HeadyPramod Cahlil Sattar, MD  To contact Stroke Continuity provider, please refer to [email or web address]. After hours, contact General Neurology

## 2014-03-23 NOTE — Progress Notes (Signed)
PT Cancellation Note  Patient Details Name: [name] FullingBetty J [name] MRN: [id number] DOB: [birth year]   Cancelled Treatment:    Reason Eval/Treat Not Completed: Patient at procedure or test/unavailable. Pt at endo. PT to return as able.   Termaine Roupp M Shaka Zech 03/23/2014, 1:15 PM  Lewis ShockAshly Alexandria Current, PT, DPT Pager #: [phone] Office #: [phone]

## 2014-03-24 ENCOUNTER — Encounter (HOSPITAL_COMMUNITY): Payer: Self-pay | Admitting: *Deleted

## 2014-03-24 ENCOUNTER — Encounter (HOSPITAL_COMMUNITY)
Admission: EM | Disposition: A | Discharge status: Home / Self Care | Payer: Self-pay | Source: Home / Self Care | Attending: Internal Medicine

## 2014-03-24 DIAGNOSIS — E162 Hypoglycemia, unspecified: Secondary | ICD-10-CM | POA: Diagnosis not present

## 2014-03-24 DIAGNOSIS — I6350 Cerebral infarction due to unspecified occlusion or stenosis of unspecified cerebral artery: Secondary | ICD-10-CM | POA: Diagnosis not present

## 2014-03-24 HISTORY — PX: LOOP RECORDER IMPLANT: SHX5954

## 2014-03-24 HISTORY — PX: LOOP RECORDER IMPLANT: SHX5477

## 2014-03-24 LAB — GLUCOSE, CAPILLARY
GLUCOSE-CAPILLARY: 224 mg/dL — AB (ref 70–99)
Glucose-Capillary: 179 mg/dL — ABNORMAL HIGH (ref 70–99)
Glucose-Capillary: 209 mg/dL — ABNORMAL HIGH (ref 70–99)

## 2014-03-24 SURGERY — LOOP RECORDER IMPLANT
Anesthesia: LOCAL

## 2014-03-24 MED ORDER — BIMATOPROST 0.01 % OP SOLN: 1.0000 [drp] | Freq: Every day | OPHTHALMIC | Status: DC

## 2014-03-24 MED ORDER — POTASSIUM CHLORIDE CRYS ER 20 MEQ PO TBCR: 20.0000 meq | EXTENDED_RELEASE_TABLET | Freq: Every day | ORAL | Status: DC

## 2014-03-24 MED ORDER — ATORVASTATIN CALCIUM 20 MG PO TABS: 40.0000 mg | ORAL_TABLET | Freq: Every day | ORAL | Status: DC

## 2014-03-24 MED ORDER — LISINOPRIL 20 MG PO TABS: 20.0000 mg | ORAL_TABLET | Freq: Every day | ORAL | Status: DC

## 2014-03-24 MED ORDER — VITAMIN D3 50 MCG (2000 UT) PO TABS: 2000.0000 [IU] | ORAL_TABLET | Freq: Every day | ORAL | Status: DC

## 2014-03-24 MED ORDER — INSULIN NPH ISOPHANE & REGULAR (70-30) 100 UNIT/ML ~~LOC~~ SUSP: 15.0000 [IU] | Freq: Two times a day (BID) | SUBCUTANEOUS | Status: DC

## 2014-03-24 MED ORDER — GABAPENTIN 300 MG PO CAPS: 300.0000 mg | ORAL_CAPSULE | Freq: Two times a day (BID) | ORAL | Status: DC

## 2014-03-24 MED ORDER — LEVOTHYROXINE SODIUM 50 MCG PO TABS: 50.0000 ug | ORAL_TABLET | Freq: Every day | ORAL | Status: DC

## 2014-03-24 MED ORDER — METOPROLOL SUCCINATE ER 25 MG PO TB24: 25.0000 mg | ORAL_TABLET | Freq: Two times a day (BID) | ORAL | Status: DC

## 2014-03-24 MED ORDER — METFORMIN HCL 1000 MG PO TABS: 1000.0000 mg | ORAL_TABLET | Freq: Two times a day (BID) | ORAL | Status: DC

## 2014-03-24 MED ORDER — ATORVASTATIN CALCIUM 20 MG PO TABS: 20.0000 mg | ORAL_TABLET | Freq: Every day | ORAL | Status: DC

## 2014-03-24 MED ORDER — CLOPIDOGREL BISULFATE 75 MG PO TABS: 75.0000 mg | ORAL_TABLET | Freq: Every day | ORAL | Status: DC

## 2014-03-24 MED ORDER — OMEPRAZOLE 40 MG PO CPDR: 40.0000 mg | DELAYED_RELEASE_CAPSULE | Freq: Every day | ORAL | Status: DC

## 2014-03-24 MED ORDER — LIDOCAINE-EPINEPHRINE 1 %-1:100000 IJ SOLN
INTRAMUSCULAR | Status: AC
  Filled 2014-03-24: qty 1

## 2014-03-24 MED ORDER — DIPHENHYDRAMINE HCL 25 MG PO TABS: 25.0000 mg | ORAL_TABLET | Freq: Four times a day (QID) | ORAL | Status: DC | PRN

## 2014-03-24 MED ORDER — OLOPATADINE HCL 0.2 % OP SOLN: 1.0000 [drp] | Freq: Every day | OPHTHALMIC | Status: DC

## 2014-03-24 MED ORDER — HYDROCHLOROTHIAZIDE 25 MG PO TABS: 25.0000 mg | ORAL_TABLET | Freq: Every day | ORAL | Status: DC

## 2014-03-24 NOTE — Progress Notes (Signed)
Came to see patient prior to discharge. She is [facility]. She will receive post hospital discharge call and will be evaluated for monthly home visits. These services will not interfere or replace home health.  Raiford NobleAtika Lani Havlik, MSN-RN,BSN- [facility] Hospital [phone]- [phone]

## 2014-03-24 NOTE — Clinical Social Work Note (Signed)
CSW consulted with unknown consult. Per chart review, pt will be discharging home with home health services. CSW signing off. Please re consult CSW if necessary.  Darlyn ChamberEmily [place], LCSWA Clinical Social Worker [phone]

## 2014-03-24 NOTE — H&P (View-Only) (Signed)
ELECTROPHYSIOLOGY CONSULT NOTE  Patient ID: [name] [name] MRN: [id number], DOB/AGE: 01/25/1941   Admit date: 03/20/2014 Date of Consult: 03/23/2014  Primary Physician: Alva GarnetSHELTON,KIMBERLY R., MD Primary Cardiologist: [facility] Reason for Consultation: Cryptogenic stroke; recommendations regarding Implantable Loop Recorder  History of Present Illness [name] FullingBetty J Moree was admitted on 03/20/2014 with left hemiparesis and dysarthria.  She was found to have left mesial temporal lobe, left periventricular frontal lobe white matter and left superior cerebellar small infarcts.  She was recently admitted 01-2014 with stroke with quickly resolving symptoms and was discharged on home on Plavix for secondary stroke prevention.  It was felt there was no indication for TEE at that time.  She has been monitored on telemetry which has demonstrated no arrhythmias. No cause has been identified. Inpatient stroke work-up is to be completed with a TEE. EP has been asked to evaluate for placement of an implantable loop recorder to monitor for atrial fibrillation.  Past Medical History Past Medical History  Diagnosis Date  . Hypertension   . Diabetes mellitus   . Stroke   . Chronic renal insufficiency   . Hyperlipidemia   . Osteopenia   . CVA (cerebral infarction)     Past Surgical History Past Surgical History  Procedure Laterality Date  . Eye surgery      Allergies/Intolerances Allergies  Allergen Reactions  . Erythromycin Other (See Comments)    unknown  . Iodine     REACTION: hives  . Penicillins     REACTION: hives   Inpatient Medications . atorvastatin  40 mg Oral q1800  . benazepril  10 mg Oral Daily  . cholecalciferol  2,000 Units Oral Daily  . clopidogrel  75 mg Oral Q breakfast  . enoxaparin (LOVENOX) injection  40 mg Subcutaneous Daily  . fluticasone  1 spray Each Nare Daily  . gabapentin  300 mg Oral BID  . hydrochlorothiazide  25 mg Oral Daily  . insulin aspart  0-9 Units  Subcutaneous TID WC  . insulin aspart protamine- aspart  15 Units Subcutaneous BID WC  . levothyroxine  50 mcg Oral QAC breakfast  . loratadine  10 mg Oral Daily  . metoprolol succinate  25 mg Oral BID  . pantoprazole  80 mg Oral Daily  . pneumococcal 23 valent vaccine  0.5 mL Intramuscular Tomorrow-1000   . sodium chloride     Social History History   Social History  . Marital Status: Single    Spouse Name: N/A    Number of Children: N/A  . Years of Education: N/A   Occupational History  . Not on file.   Social History Main Topics  . Smoking status: Current Every Day Smoker -- 0.50 packs/day for 60 years    Types: Cigarettes  . Smokeless tobacco: Not on file     Comment: pt states "sometimes I quit smoking for about 2 weeks then I start back"  . Alcohol Use: Yes     Comment: occasionally  . Drug Use: No  . Sexual Activity: Not on file   Other Topics Concern  . Not on file   Social History Narrative  . No narrative on file    Family History  Problem Relation Age of Onset  . Diabetes Mother   . Hypertension Mother     Review of Systems General: No chills, fever, night sweats or weight changes  Cardiovascular:  No chest pain, dyspnea on exertion, edema, orthopnea, palpitations, paroxysmal nocturnal dyspnea Dermatological: No rash,  lesions or masses Respiratory: No cough, dyspnea Urologic: No hematuria, dysuria Abdominal: No nausea, vomiting, diarrhea, bright red blood per rectum, melena, or hematemesis Neurologic: No visual changes, weakness, changes in mental status All other systems reviewed and are otherwise negative except as noted above.  Physical Exam Blood pressure 177/48, pulse 82, temperature 99.2 F (37.3 C), temperature source Oral, resp. rate 18, SpO2 96.00%.  General: Well developed, well appearing 73 y.o. female in no acute distress. HEENT: Normocephalic, atraumatic. EOMs intact. Sclera nonicteric. Oropharynx clear.  Neck: Supple without bruits.  No JVD. Lungs: Respirations regular and unlabored, CTA bilaterally. No wheezes, rales or rhonchi. Heart: RRR. S1, S2 present. No murmurs, rub, S3 or S4. Abdomen: Soft, non-tender, non-distended. BS present x 4 quadrants. No hepatosplenomegaly.  Extremities: No clubbing, cyanosis or edema. DP/PT/Radials 2+ and equal bilaterally. Psych: Normal affect. Neuro: Alert and oriented X 3. Moves all extremities spontaneously. Musculoskeletal: No kyphosis. Skin: Intact. Warm and dry. No rashes or petechiae in exposed areas.   Labs Lab Results  Component Value Date   WBC 5.7 03/21/2014   HGB 10.4* 03/21/2014   HCT 30.7* 03/21/2014   MCV 87.7 03/21/2014   PLT 242 03/21/2014    Recent Labs Lab 03/20/14 2156  03/22/14 0657  NA 141  < > 141  K 3.8  < > 4.0  CL 99  < > 101  CO2 30  < > 28  BUN 25*  < > 18  CREATININE 1.48*  < > 1.08  CALCIUM 9.5  < > 9.0  PROT 7.4  --   --   BILITOT <0.2*  --   --   ALKPHOS 62  --   --   ALT 7  --   --   AST 15  --   --   GLUCOSE 42*  < > 249*  < > = values in this interval not displayed.  Recent Labs  03/20/14 2156  INR 1.03    Radiology/Studies Dg Chest 2 View 03/22/2014   CLINICAL DATA:  Stroke symptoms with history of hypertension and diabetes  EXAM: CHEST  2 VIEW  COMPARISON:  DG CHEST 2 VIEW dated 01/10/2014  FINDINGS: The lungs are adequately inflated and clear. The cardiopericardial silhouette is normal in size. The pulmonary vascularity is not engorged. The mediastinum is normal in width. There is no pleural effusion. The observed portions of the bony thorax appear normal.  IMPRESSION: There is no evidence of pneumonia nor CHF. Mild hyperinflation may be voluntary or could reflect underlying COPD.   Electronically Signed   By: David  [place]   On: 03/22/2014 00:54   Ct Head (brain) Wo Contrast 03/20/2014   CLINICAL DATA:  Left-sided weakness  EXAM: CT HEAD WITHOUT CONTRAST  TECHNIQUE: Contiguous axial images were obtained from the base of the skull  through the vertex without intravenous contrast.  COMPARISON:  CT HEAD W/O CM dated 01/14/2014; MR HEAD W/O CM dated 01/09/2014  FINDINGS: There is mild diffuse cerebral and cerebellar atrophy with compensatory ventriculomegaly. These findings are stable. There is decreased density in the deep white matter of both cerebral hemispheres consistent with chronic small vessel ischemic type change. There are old lacunar infarctions in the basal ganglia bilaterally. There is no evidence of an acute intracranial hemorrhage. There is no objective evidence of an acute evolving ischemic infarction. There are basal ganglia calcifications bilaterally which are stable. The cerebellum and brainstem are normal in density.  At bone window settings the observed portions of paranasal  sinuses and mastoid air cells are clear. There is no evidence of an acute skull fracture.  IMPRESSION: 1. There is no evidence of an acute ischemic infarction. There are old lacunar infarctions in both cerebral hemispheres, and there is decreased density in the deep white matter of both cerebral hemispheres consistent with chronic small vessel ischemia. 2. There is no acute intracranial hemorrhage. 3. There are stable basal ganglia calcifications bilaterally. 4. These results were called by telephone at the time of interpretation on 03/20/2014 at 10:07 PM to Dr. Roseanne Reno, who verbally acknowledged these results.   Electronically Signed   By: David  [place]   On: 03/20/2014 22:09   Mr Brain Wo Contrast 03/22/2014   CLINICAL DATA:  Evaluate stroke, hypoglycemia, intermittent slurred speech.  EXAM: MRI HEAD WITHOUT CONTRAST  MRA HEAD WITHOUT CONTRAST  TECHNIQUE: Multiplanar, multiecho pulse sequences of the brain and surrounding structures were obtained without intravenous contrast. Angiographic images of the head were obtained using MRA technique without contrast.  COMPARISON:  None.  MR HEAD W/O CM dated 01/09/2014; CT HEAD W/O CM dated 03/20/2014; CT HEAD W/O CM  dated 01/14/2014  FINDINGS: MRI HEAD FINDINGS  Punctate focus of reduced diffusion within the left mesial temporal lobe, axial 14/60. Corresponding low ADC values. 7 mm focus of reduced diffusion within the left periventricular frontal white matter, axial 20/60 with corresponding low ADC values. 3 mm focus of reduced diffusion within the left superior cerebellum, with corresponding low ADC values.  Moderately motion degraded gradient sequence, with scattered subcentimeter foci of susceptibility artifact within the supra and infratentorial brain, better seen on prior less motion degraded MRI. No convincing evidence of new micro hemorrhages. No midline shift or mass effect.  Moderate ventriculomegaly, likely on the basis of global parenchymal brain volume loss as there is overall commensurate enlargement cerebral sulci and cerebellar folia. Severe white matter changes, including pontine T2 hyperintensities are similar. Remote bilateral basal ganglia, thalamic and cerebellar infarcts. No midline shift or mass effect.  No abnormal extra-axial fluid collections. Status post bilateral ocular lens implants. Patient is edentulous. No abnormal sellar expansion. No cerebellar tonsillar ectopia.  MRA HEAD FINDINGS  Anterior circulation: Mildly motion degraded evaluation limits the sensitivity.  Anterior circulation: Normal flow related enhancement of the included cervical, petrous, cavernous internal carotid arteries. However, suspected at least 50% stenosis of the left anterior genu of the internal carotid artery corresponding to coarse calcification on prior head CT. Finding may be accentuated by patient motion. Normal flow related enhancement of the supra clinoid internal carotid arteries, the right is more robust. Patent flow related enhancement of the anterior and middle cerebral arteries. At least 50% narrowing of the origin of a left M2 branch similar to worse.  Posterior circulation: Motion degrades evaluation. Thready  flow of the left vertebral artery throughout the intradural segment, similar to prior examination. Basilar artery is patent, patent main branch vessels. Posterior communicating arteries are not distinctly identified. Focal high-grade stenosis of the right P2 segment, axial 97/160. Normal flow within the enhancement of the P3 segments.  No convincing evidence of aneurysm. No large vessel occlusion. Mild irregularity of the intracranial vessels diffusely most consistent with atherosclerosis.  IMPRESSION: MRI head: Left mesial temporal lobe, left periventricular frontal white matter and left superior cerebellar subcentimeter foci of acute ischemia. These span multiple vascular territories, and may reflect thromboembolic disease without acute large territory infarct.  Moderately motion degraded examination. Scattered susceptibility artifact most consistent with sequelae of chronic hypertension, with multiple remote basal  ganglia/thalamic and cerebellar infarcts. Severe white matter changes suggest chronic small vessel ischemic disease.  MRA head: Motion degraded examination. Multifocal intracranial mid to high-grade stenosis, including anterior genu of the left internal carotid artery, left M2, right P2 and left distal vertebral arteries, relatively unchanged for this motion degraded examination.  Diffuse mild intracranial luminal irregularity compatible with atherosclerosis.   Electronically Signed   By: Awilda Metroourtnay  [place]   On: 03/22/2014 01:04   Echocardiogram  01-10-2014 EF 60-65%, no RWMA, LA 33  12-lead ECG sinus rhythm, rate 74, normal intervals Telemetry sinus rhythm with no arrhythmias   Assessment and Plan 1. Cryptogenic stroke    If the TEE is negative, we recommend loop recorder insertion to monitor for AF. The indication for loop recorder insertion / monitoring for AF in setting of cryptogenic stroke was discussed with the patient. The loop recorder insertion procedure was reviewed in detail  including risks and benefits. These risks include but are not limited to bleeding and infection. The patient expressed verbal understanding and agrees to proceed. The patient was also counseled regarding wound care and device follow-up.  w positive TEE for PFO  Will order venous dopplers  Signed,

## 2014-03-24 NOTE — Progress Notes (Signed)
Spoke with patient about diabetes and home regimen for diabetes control. Patient reports that she is followed by her PCP for diabetes management and currently she takes 70/30 20 units (if CBG < 100 mg/dl) or 62/9570/30 40 units (if CBG >100 mg;dl) once of twice a day depending on how blood sugar is running, Armaryl 4 mg BID, and Metformin 1000 mg BID as an outpatient ford diabetes control.  Inquired about knowledge about A1C and patient reports that she does not know what an A1C is. Discussed A1C results (10.1% on 03/21/14) and explained what an A1C is, basic pathophysiology of DM Type 2, basic home care, importance of checking CBGs and maintaining good CBG control to prevent long-term and short-term complications. Discussed impact of nutrition, exercise, stress, sickness, and medications on diabetes control.  Patient states that she has made changes over the past year with her diet and she tries to stay away from sweets but she does eat a lot of fruits. Discussed carbohydrates, carbohydrate goals per day and meal, along with portion sizes. Patient states that she is not followed by an endocrinologist and she would like to be referred to a local endocrinologist at time of discharge so she can get help with improving diabetes control.  Patient verbalized understanding of information discussed and she states that she has no further questions at this time related to diabetes.   Thanks, Orlando PennerMarie Benett Swoyer, RN, MSN, CCRN Diabetes Coordinator Inpatient Diabetes [facility] [phone] (Team Pager) [phone] ([facility] office) [phone] [facility] office)

## 2014-03-24 NOTE — Discharge Summary (Signed)
PATIENT DETAILS Name: [name] Age: 73 y.o. Sex: female Date of Birth: [birth year] MRN: [id number]. Admit Date: 03/20/2014 Admitting Physician: Cristal Ford, MD WUJ:WJXBJYN,WGNFAOZH R., MD  Recommendations for Outpatient Follow-up:  1. Optimize Diabetic and anti-hypertensive regimen.  PRIMARY DISCHARGE DIAGNOSIS:  Principal Problem:   CVA (cerebral infarction) Active Problems:   Hypertension   Hyperlipidemia   DM type 2, uncontrolled, with renal complications   Hypoglycemia   CKD (chronic kidney disease), stage III      PAST MEDICAL HISTORY: Past Medical History  Diagnosis Date  . Hypertension   . Diabetes mellitus   . Chronic renal insufficiency   . Hyperlipidemia   . Osteopenia   . CVA (cerebral infarction)     DISCHARGE MEDICATIONS:   Medication List    STOP taking these medications       furosemide 20 MG tablet  Commonly known as:  LASIX     glimepiride 4 MG tablet  Commonly known as:  AMARYL     meloxicam 7.5 MG tablet  Commonly known as:  MOBIC     naproxen 500 MG tablet  Commonly known as:  NAPROSYN      TAKE these medications       atorvastatin 20 MG tablet  Commonly known as:  LIPITOR  Take 2 tablets (40 mg total) by mouth daily at 6 PM.     bimatoprost 0.01 % Soln  Commonly known as:  LUMIGAN  Place 1 drop into both eyes at bedtime.     clopidogrel 75 MG tablet  Commonly known as:  PLAVIX  Take 1 tablet (75 mg total) by mouth daily with breakfast.     diphenhydrAMINE 25 MG tablet  Commonly known as:  BENADRYL  Take 1 tablet (25 mg total) by mouth every 6 (six) hours as needed for allergies.     gabapentin 300 MG capsule  Commonly known as:  NEURONTIN  Take 1 capsule (300 mg total) by mouth 2 (two) times daily.     hydrochlorothiazide 25 MG tablet  Commonly known as:  HYDRODIURIL  Take 1 tablet (25 mg total) by mouth daily.     insulin NPH-regular Human (70-30) 100 UNIT/ML injection  Commonly known as:  NOVOLIN 70/30    Inject 15 Units into the skin 2 (two) times daily with a meal.     levothyroxine 50 MCG tablet  Commonly known as:  SYNTHROID, LEVOTHROID  Take 1 tablet (50 mcg total) by mouth daily.     lisinopril 20 MG tablet  Commonly known as:  PRINIVIL,ZESTRIL  Take 1 tablet (20 mg total) by mouth daily.     metFORMIN 1000 MG tablet  Commonly known as:  GLUCOPHAGE  Take 1 tablet (1,000 mg total) by mouth 2 (two) times daily with a meal.     metoprolol succinate 25 MG 24 hr tablet  Commonly known as:  TOPROL-XL  Take 1 tablet (25 mg total) by mouth 2 (two) times daily.     Olopatadine HCl 0.2 % Soln  Commonly known as:  PATADAY  Place 1 drop into both eyes daily.     omeprazole 40 MG capsule  Commonly known as:  PRILOSEC  Take 1 capsule (40 mg total) by mouth daily.     potassium chloride SA 20 MEQ tablet  Commonly known as:  K-DUR,KLOR-CON  Take 1 tablet (20 mEq total) by mouth daily.     Vitamin D3 2000 UNITS Tabs  Take 2,000 Units by mouth daily.  ALLERGIES:   Allergies  Allergen Reactions  . Erythromycin Other (See Comments)    unknown  . Iodine     REACTION: hives  . Penicillins     REACTION: hives    BRIEF HPI:  See H&P, Labs, Consult and Test reports for all details in brief, [name] [name] is a 73 y.o. female with a history of hypertension, hyperlipidemia, diabetes mellitus and stroke in March 2015, presented with exacerbation of left-sided weakness as well as slurred speech.She was also noted to be hypoglycemic with blood sugar 42,she was given one amp of D50, repeat blood sugar was 148. Weakness of the left side was persistent, particularly involving her left leg. Further work up including MRI brain showed left mesial temporal lobe, left periventricular frontal white matter and left superior cerebellar subcentimeter foci of acute ischemia.Since these spanned multiple vessel territories, there was concern for cardioembolic CVA, patient underwent TEE which was  negative for foci for CVA-but positive for PFO. She also underwent Loop recorder placement on 5/19.  CONSULTATIONS:   cardiology and neurology  PERTINENT RADIOLOGIC STUDIES: Dg Chest 2 View  03/22/2014   CLINICAL DATA:  Stroke symptoms with history of hypertension and diabetes  EXAM: CHEST  2 VIEW  COMPARISON:  DG CHEST 2 VIEW dated 01/10/2014  FINDINGS: The lungs are adequately inflated and clear. The cardiopericardial silhouette is normal in size. The pulmonary vascularity is not engorged. The mediastinum is normal in width. There is no pleural effusion. The observed portions of the bony thorax appear normal.  IMPRESSION: There is no evidence of pneumonia nor CHF. Mild hyperinflation may be voluntary or could reflect underlying COPD.   Electronically Signed   By: David  [place]   On: 03/22/2014 00:54   Ct Head (brain) Wo Contrast  03/20/2014   CLINICAL DATA:  Left-sided weakness  EXAM: CT HEAD WITHOUT CONTRAST  TECHNIQUE: Contiguous axial images were obtained from the base of the skull through the vertex without intravenous contrast.  COMPARISON:  CT HEAD W/O CM dated 01/14/2014; MR HEAD W/O CM dated 01/09/2014  FINDINGS: There is mild diffuse cerebral and cerebellar atrophy with compensatory ventriculomegaly. These findings are stable. There is decreased density in the deep white matter of both cerebral hemispheres consistent with chronic small vessel ischemic type change. There are old lacunar infarctions in the basal ganglia bilaterally. There is no evidence of an acute intracranial hemorrhage. There is no objective evidence of an acute evolving ischemic infarction. There are basal ganglia calcifications bilaterally which are stable. The cerebellum and brainstem are normal in density.  At bone window settings the observed portions of paranasal sinuses and mastoid air cells are clear. There is no evidence of an acute skull fracture.  IMPRESSION: 1. There is no evidence of an acute ischemic infarction. There  are old lacunar infarctions in both cerebral hemispheres, and there is decreased density in the deep white matter of both cerebral hemispheres consistent with chronic small vessel ischemia. 2. There is no acute intracranial hemorrhage. 3. There are stable basal ganglia calcifications bilaterally. 4. These results were called by telephone at the time of interpretation on 03/20/2014 at 10:07 PM to Dr. Roseanne Reno, who verbally acknowledged these results.   Electronically Signed   By: David  [place]   On: 03/20/2014 22:09   Mr Brain Wo Contrast  03/22/2014   CLINICAL DATA:  Evaluate stroke, hypoglycemia, intermittent slurred speech.  EXAM: MRI HEAD WITHOUT CONTRAST  MRA HEAD WITHOUT CONTRAST  TECHNIQUE: Multiplanar, multiecho pulse sequences of the  brain and surrounding structures were obtained without intravenous contrast. Angiographic images of the head were obtained using MRA technique without contrast.  COMPARISON:  None.  MR HEAD W/O CM dated 01/09/2014; CT HEAD W/O CM dated 03/20/2014; CT HEAD W/O CM dated 01/14/2014  FINDINGS: MRI HEAD FINDINGS  Punctate focus of reduced diffusion within the left mesial temporal lobe, axial 14/60. Corresponding low ADC values. 7 mm focus of reduced diffusion within the left periventricular frontal white matter, axial 20/60 with corresponding low ADC values. 3 mm focus of reduced diffusion within the left superior cerebellum, with corresponding low ADC values.  Moderately motion degraded gradient sequence, with scattered subcentimeter foci of susceptibility artifact within the supra and infratentorial brain, better seen on prior less motion degraded MRI. No convincing evidence of new micro hemorrhages. No midline shift or mass effect.  Moderate ventriculomegaly, likely on the basis of global parenchymal brain volume loss as there is overall commensurate enlargement cerebral sulci and cerebellar folia. Severe white matter changes, including pontine T2 hyperintensities are similar. Remote  bilateral basal ganglia, thalamic and cerebellar infarcts. No midline shift or mass effect.  No abnormal extra-axial fluid collections. Status post bilateral ocular lens implants. Patient is edentulous. No abnormal sellar expansion. No cerebellar tonsillar ectopia.  MRA HEAD FINDINGS  Anterior circulation: Mildly motion degraded evaluation limits the sensitivity.  Anterior circulation: Normal flow related enhancement of the included cervical, petrous, cavernous internal carotid arteries. However, suspected at least 50% stenosis of the left anterior genu of the internal carotid artery corresponding to coarse calcification on prior head CT. Finding may be accentuated by patient motion. Normal flow related enhancement of the supra clinoid internal carotid arteries, the right is more robust. Patent flow related enhancement of the anterior and middle cerebral arteries. At least 50% narrowing of the origin of a left M2 branch similar to worse.  Posterior circulation: Motion degrades evaluation. Thready flow of the left vertebral artery throughout the intradural segment, similar to prior examination. Basilar artery is patent, patent main branch vessels. Posterior communicating arteries are not distinctly identified. Focal high-grade stenosis of the right P2 segment, axial 97/160. Normal flow within the enhancement of the P3 segments.  No convincing evidence of aneurysm. No large vessel occlusion. Mild irregularity of the intracranial vessels diffusely most consistent with atherosclerosis.  IMPRESSION: MRI head: Left mesial temporal lobe, left periventricular frontal white matter and left superior cerebellar subcentimeter foci of acute ischemia. These span multiple vascular territories, and may reflect thromboembolic disease without acute large territory infarct.  Moderately motion degraded examination. Scattered susceptibility artifact most consistent with sequelae of chronic hypertension, with multiple remote basal  ganglia/thalamic and cerebellar infarcts. Severe white matter changes suggest chronic small vessel ischemic disease.  MRA head: Motion degraded examination. Multifocal intracranial mid to high-grade stenosis, including anterior genu of the left internal carotid artery, left M2, right P2 and left distal vertebral arteries, relatively unchanged for this motion degraded examination.  Diffuse mild intracranial luminal irregularity compatible with atherosclerosis.   Electronically Signed   By: Awilda Metroourtnay  Bloomer   On: 03/22/2014 01:04   Mr Maxine GlennMra Head/brain Wo Cm  03/22/2014   CLINICAL DATA:  Evaluate stroke, hypoglycemia, intermittent slurred speech.  EXAM: MRI HEAD WITHOUT CONTRAST  MRA HEAD WITHOUT CONTRAST  TECHNIQUE: Multiplanar, multiecho pulse sequences of the brain and surrounding structures were obtained without intravenous contrast. Angiographic images of the head were obtained using MRA technique without contrast.  COMPARISON:  None.  MR HEAD W/O CM dated 01/09/2014; CT HEAD W/O  CM dated 03/20/2014; CT HEAD W/O CM dated 01/14/2014  FINDINGS: MRI HEAD FINDINGS  Punctate focus of reduced diffusion within the left mesial temporal lobe, axial 14/60. Corresponding low ADC values. 7 mm focus of reduced diffusion within the left periventricular frontal white matter, axial 20/60 with corresponding low ADC values. 3 mm focus of reduced diffusion within the left superior cerebellum, with corresponding low ADC values.  Moderately motion degraded gradient sequence, with scattered subcentimeter foci of susceptibility artifact within the supra and infratentorial brain, better seen on prior less motion degraded MRI. No convincing evidence of new micro hemorrhages. No midline shift or mass effect.  Moderate ventriculomegaly, likely on the basis of global parenchymal brain volume loss as there is overall commensurate enlargement cerebral sulci and cerebellar folia. Severe white matter changes, including pontine T2 hyperintensities  are similar. Remote bilateral basal ganglia, thalamic and cerebellar infarcts. No midline shift or mass effect.  No abnormal extra-axial fluid collections. Status post bilateral ocular lens implants. Patient is edentulous. No abnormal sellar expansion. No cerebellar tonsillar ectopia.  MRA HEAD FINDINGS  Anterior circulation: Mildly motion degraded evaluation limits the sensitivity.  Anterior circulation: Normal flow related enhancement of the included cervical, petrous, cavernous internal carotid arteries. However, suspected at least 50% stenosis of the left anterior genu of the internal carotid artery corresponding to coarse calcification on prior head CT. Finding may be accentuated by patient motion. Normal flow related enhancement of the supra clinoid internal carotid arteries, the right is more robust. Patent flow related enhancement of the anterior and middle cerebral arteries. At least 50% narrowing of the origin of a left M2 branch similar to worse.  Posterior circulation: Motion degrades evaluation. Thready flow of the left vertebral artery throughout the intradural segment, similar to prior examination. Basilar artery is patent, patent main branch vessels. Posterior communicating arteries are not distinctly identified. Focal high-grade stenosis of the right P2 segment, axial 97/160. Normal flow within the enhancement of the P3 segments.  No convincing evidence of aneurysm. No large vessel occlusion. Mild irregularity of the intracranial vessels diffusely most consistent with atherosclerosis.  IMPRESSION: MRI head: Left mesial temporal lobe, left periventricular frontal white matter and left superior cerebellar subcentimeter foci of acute ischemia. These span multiple vascular territories, and may reflect thromboembolic disease without acute large territory infarct.  Moderately motion degraded examination. Scattered susceptibility artifact most consistent with sequelae of chronic hypertension, with multiple  remote basal ganglia/thalamic and cerebellar infarcts. Severe white matter changes suggest chronic small vessel ischemic disease.  MRA head: Motion degraded examination. Multifocal intracranial mid to high-grade stenosis, including anterior genu of the left internal carotid artery, left M2, right P2 and left distal vertebral arteries, relatively unchanged for this motion degraded examination.  Diffuse mild intracranial luminal irregularity compatible with atherosclerosis.   Electronically Signed   By: Awilda Metroourtnay  Bloomer   On: 03/22/2014 01:04     PERTINENT LAB RESULTS: CBC: No results found for this basename: WBC, HGB, HCT, PLT,  in the last 72 hours CMET CMP     Component Value Date/Time   NA 141 03/22/2014 0657   K 4.0 03/22/2014 0657   CL 101 03/22/2014 0657   CO2 28 03/22/2014 0657   GLUCOSE 249* 03/22/2014 0657   BUN 18 03/22/2014 0657   CREATININE 1.08 03/22/2014 0657   CALCIUM 9.0 03/22/2014 0657   PROT 7.4 03/20/2014 2156   ALBUMIN 3.6 03/20/2014 2156   AST 15 03/20/2014 2156   ALT 7 03/20/2014 2156   ALKPHOS 62  03/20/2014 2156   BILITOT <0.2* 03/20/2014 2156   GFRNONAA 49* 03/22/2014 0657   GFRAA 57* 03/22/2014 0657    GFR Estimated Creatinine Clearance: 47 ml/min (by C-G formula based on Cr of 1.08). No results found for this basename: LIPASE, AMYLASE,  in the last 72 hours No results found for this basename: CKTOTAL, CKMB, CKMBINDEX, TROPONINI,  in the last 72 hours No components found with this basename: POCBNP,  No results found for this basename: DDIMER,  in the last 72 hours  Recent Labs  03/21/14 2238  HGBA1C 10.1*   No results found for this basename: CHOL, HDL, LDLCALC, TRIG, CHOLHDL, LDLDIRECT,  in the last 72 hours  Recent Labs  03/21/14 2238  TSH 0.869   No results found for this basename: VITAMINB12, FOLATE, FERRITIN, TIBC, IRON, RETICCTPCT,  in the last 72 hours Coags: No results found for this basename: PT, INR,  in the last 72 hours Microbiology: Recent  Results (from the past 240 hour(s))  URINE CULTURE     Status: None   Collection Time    03/20/14 10:36 PM      Result Value Ref Range Status   Specimen Description URINE, CLEAN CATCH   Final   Special Requests NONE   Final   Culture  Setup Time     Final   Value: 03/21/2014 04:12     Performed at [facility] Count     Final   Value: 30,000 COLONIES/ML     Performed at [facility]   Culture     Final   Value: Multiple bacterial morphotypes present, none predominant. Suggest appropriate recollection if clinically indicated.     Performed at [facility]   Report Status 03/22/2014 FINAL   Final     BRIEF HOSPITAL COURSE:  Acute CVA (cerebral infarction)  - Admitted with acute on chronic left-sided weakness, although hypoglycemic, continued to have persistent left-sided weakness even following correction of hypoglycemia. MRI brain on 5/16 confirmed acute CVA. Neurology consulted, currently on Plavix. Sinus rhythm on telemetry monitor. Recent echocardiogram on 3/7 showed EF around 60-65%, recent carotid Doppler on 3/8 showed 1-10 9% ICA stenosis. LDL 108, hemoglobin A1c 9.9.  -Since the Acute CVA was thought to be cardioembolic, patient underwent a TEE on 5/18 which showed no cardiac source of emboli, but did have PFO. Subsequently underwent Lower ext doppler-which was negative. Also seen by Cardiology and underwent a loop recorder implantation on 5/19. Very mild left hemiparesis persists on exam, otherwise remains stable. Initial plans were for CIR transfer, however patient refused and now wants to go home.  Hypertension  - Continue lisinopril,metoprolol and HCTZ, further optimization to be done in the outpatient setting  Hyperlipidemia  - Continue with statins. Does of Lipitor increased to 40 mg as an LDL still more than 100   DM type 2, uncontrolled  - CBGs stable- continue SSI.Continue insulin 70/30 15 units twice a day. Resume metformin on  discharge. Hemoglobin A1c 9.9.   Hypoglycemia  - Resolved.  - Continue to monitor, have decreased Insulin dose and stopped Glimeperide  ARF on Chronic kidney disease stage III  -resolved, creatinine back to baseline . Stop Lasix, Meloxicam on discharge  Hypothyroidism  - Continue with levothyroxine  TODAY-DAY OF DISCHARGE:  Subjective:   Cherrill Scrima today has no headache,no chest abdominal pain,no new weakness tingling or numbness, feels much better wants to go home today.   Objective:   Blood pressure 147/59, pulse  75, temperature 98.7 F (37.1 C), temperature source Oral, resp. rate 18, height 5\' 4"  (1.626 m), weight 80.786 kg (178 lb 1.6 oz), SpO2 98.00%.  Intake/Output Summary (Last 24 hours) at 03/24/14 1357 Last data filed at 03/24/14 0912  Gross per 24 hour  Intake    240 ml  Output      0 ml  Net    240 ml   Filed Weights   03/23/14 1600  Weight: 80.786 kg (178 lb 1.6 oz)    Exam Awake Alert, Oriented *3, No new F.N deficits, Normal affect [place].AT,PERRAL Supple Neck,No JVD, No cervical lymphadenopathy appriciated.  Symmetrical Chest wall movement, Good air movement bilaterally, CTAB RRR,No Gallops,Rubs or new Murmurs, No Parasternal Heave +ve B.Sounds, Abd Soft, Non tender, No organomegaly appriciated, No rebound -guarding or rigidity. No Cyanosis, Clubbing or edema, No new Rash or bruise  DISCHARGE CONDITION: Stable  DISPOSITION: Home with home health services  DISCHARGE INSTRUCTIONS:    Activity:  As tolerated with Full fall precautions use walker/cane & assistance as needed  Diet recommendation: Diabetic Diet Heart Healthy diet  Discharge Instructions   Call MD for:  persistant dizziness or light-headedness    Complete by:  As directed      Diet - low sodium heart healthy    Complete by:  As directed      Diet general    Complete by:  As directed      Increase activity slowly    Complete by:  As directed            Follow-up Information     Follow up with Alva Garnet., MD. Schedule an appointment as soon as possible for a visit in 1 week.   Specialty:  Internal Medicine   Contact information:   [address] [place] [address] [phone]       Follow up with Gates Rigg, MD. Schedule an appointment as soon as possible for a visit in 2 months.   Specialties:  Neurology, Radiology   Contact information:   [address] [place] [address] [phone]         Total Time spent on discharge equals 45 minutes.  Signed: Maretta Bees 03/24/2014 1:57 PM  **Disclaimer: This note may have been dictated with voice recognition software. Similar sounding words can inadvertently be transcribed and this note may contain transcription errors which may not have been corrected upon publication of note.**

## 2014-03-24 NOTE — Progress Notes (Signed)
Endocrinologist List given to patient as requested (14 MD in [place]); also instructed patient to talk to her PCP Dr Andi DevonKimberly Shelton for an Endocrine referral; Abelino DerrickB Roslin Norwood RN,BSN,MHA [phone]

## 2014-03-24 NOTE — CV Procedure (Signed)
SURGEON:  Hillis RangeJames Dani Danis, MD     PREPROCEDURE DIAGNOSIS:  Cryptogenic Stroke    POSTPROCEDURE DIAGNOSIS:  Cryptogenic Stroke     PROCEDURES:   1. Implantable loop recorder implantation    INTRODUCTION:  [name] [name] is a 73 y.o. female with a history of unexplained stroke who presents today for implantable loop implantation.  The patient has had a cryptogenic stroke.  Despite an extensive workup by neurology, no reversible causes have been identified.  she has worn telemetry during which she did not have arrhythmias.  There is significant concern for possible atrial fibrillation as the cause for the patients stroke.  The patient therefore presents today for implantable loop implantation.     DESCRIPTION OF PROCEDURE:  Informed written consent was obtained, and the patient was brought to the electrophysiology lab in a fasting state.  The patient required no sedation for the procedure today.  Mapping over the patient's chest was performed by the EP lab staff to identify the area where electrograms were most prominent for ILR recording.  This area was found to be the left parasternal region over the 3rd-4th intercostal space. The patients left chest was therefore prepped and draped in the usual sterile fashion by the EP lab staff. The skin overlying the left parasternal region was infiltrated with lidocaine for local analgesia.  A 0.5-cm incision was made over the left parasternal region over the 3rd intercostal space.  A subcutaneous ILR pocket was fashioned using a combination of sharp and blunt dissection.  A Medtronic Reveal [place]Linq model [id number] SN [id number] implantable loop recorder was then placed into the pocket  R waves were very prominent and measured 0.18-0.26 mV.  Steri- Strips and a sterile dressing were then applied.  There were no early apparent complications.     CONCLUSIONS:   1. Successful implantation of a Medtronic Reveal LINQ implantable loop recorder for cryptogenic stroke  2. No early  apparent complications.

## 2014-03-24 NOTE — Interval H&P Note (Signed)
History and Physical Interval Note:  Dopplers reveal no DVT.  The patient presents with cryptogenic stroke.  I spoke at length with the patient about monitoring for afib with an implantable loop recorder.  Risks, benefits, and alteratives to implantable loop recorder were discussed with the patient today.   At this time, the patient is very clear in their decision to proceed with implantable loop recorder.   03/24/2014 7:11 AM  [name] [name]  has presented today for surgery, with the diagnosis of STROKE  The various methods of treatment have been discussed with the patient and family. After consideration of risks, benefits and other options for treatment, the patient has consented to  Procedure(s): LOOP RECORDER IMPLANT (N/A) as a surgical intervention .  The patient's history has been reviewed, patient examined, no change in status, stable for surgery.  I have reviewed the patient's chart and labs.  Questions were answered to the patient's satisfaction.     Hillis RangeJames Cyrena Kuchenbecker

## 2014-03-24 NOTE — Progress Notes (Signed)
PATIENT DETAILS Name: [name] [name] Age: 73 y.o. Sex: female Date of Birth: [birth year] Admit Date: 03/20/2014 Admitting Physician Cristal FordSrikar A Reddy, MD WUJ:WJXBJYN,WGNFAOZHPCP:SHELTON,KIMBERLY R., MD  Brief Summary: [name] FullingBetty J Zappulla is a 73 y.o. female with a history of hypertension, hyperlipidemia, diabetes mellitus and stroke in March 2015, presented with exacerbation of left-sided weakness as well as slurred speech.She was also noted to be hypoglycemic with blood sugar 42,she was given one amp of D50, repeat blood sugar was 148. Weakness of the left side was persistent, particularly involving her left leg. Further work up including MRI brain showed left mesial temporal lobe, left periventricular frontal white matter and left superior cerebellar subcentimeter foci of acute ischemia.Since these spanned multiple vessel territories, there was concern for cardioembolic CVA, patient underwent TEE which was negative for foci for CVA-but positive for PFO. She also underwent Loop recorder placement on 5/19. Current plans are for CIR transfer.   Subjective: No major complaints overnight-now wants to go to CIR.  Assessment/Plan: Principal Problem:  Acute CVA (cerebral infarction) - Admitted with acute on chronic left-sided weakness, although hypoglycemic, continued to have persistent left-sided weakness even following correction of hypoglycemia. MRI brain on 5/16 confirmed acute CVA. Neurology consulted, currently on Plavix. Sinus rhythm on telemetry monitor. Recent echocardiogram on 3/7 showed EF around 60-65%, recent carotid Doppler on 3/8 showed 1-10 9% ICA stenosis. LDL 108, hemoglobin A1c 9.9. -Since the Acute CVA was thought to be cardioembolic, patient underwent a TEE on 5/18 which showed no cardiac source of emboli, but did have PFO. Subsequently underwent Lower ext doppler-which was negative. Also seen by Cardiology and underwent a loop recorder implantation on 5/19. Very mild left hemiparesis persists on exam,  otherwise remains stable. Current plans are for CIR eval    Hypertension - Continue benazepril,metoprolol and HCTZ, we'll continue to follow BP trend and adjust medications accordingly.    Hyperlipidemia - Continue with statins. Does of Lipitor increased to 40 mg as an LDL still more than 100     DM type 2, uncontrolled - CBGs stable- continue SSI.Continue insulin 70/30 15 units twice a day. Resume metformin on discharge.    Hypoglycemia - Resolved. - Continue to monitor CBGs closely while on insulin  ARF on Chronic kidney disease stage III  -resolved, creatinine back to baseline  Hypothyroidism - Continue with levothyroxine  Disposition: Remain inpatient-CIR if bed available  DVT Prophylaxis: Prophylactic Lovenox  Code Status: Full code  Family Communication Husband at bedside  Procedures:  None  CONSULTS:  neurology  MEDICATIONS: Scheduled Meds: . atorvastatin  40 mg Oral q1800  . benazepril  10 mg Oral Daily  . cholecalciferol  2,000 Units Oral Daily  . clopidogrel  75 mg Oral Q breakfast  . enoxaparin (LOVENOX) injection  40 mg Subcutaneous Daily  . fluticasone  1 spray Each Nare Daily  . gabapentin  300 mg Oral BID  . hydrochlorothiazide  25 mg Oral Daily  . insulin aspart  0-9 Units Subcutaneous TID WC  . insulin aspart protamine- aspart  15 Units Subcutaneous BID WC  . levothyroxine  50 mcg Oral QAC breakfast  . loratadine  10 mg Oral Daily  . metoprolol succinate  25 mg Oral BID  . pantoprazole  80 mg Oral Daily  . pneumococcal 23 valent vaccine  0.5 mL Intramuscular Tomorrow-1000   Continuous Infusions:   PRN Meds:.acetaminophen, albuterol, diphenhydrAMINE, hydrALAZINE, senna-docusate  Antibiotics: Anti-infectives   None  PHYSICAL EXAM: Vital signs in last 24 hours: Filed Vitals:   03/23/14 2205 03/24/14 0148 03/24/14 0459 03/24/14 0958  BP: 150/60 166/62 153/83 147/59  Pulse: 85 78 74 75  Temp: 97.7 F (36.5 C) 98 F (36.7 C)  98.5 F (36.9 C) 98.7 F (37.1 C)  TempSrc: Oral Oral Oral Oral  Resp: 18 18 18 18   Height:      Weight:      SpO2: 100% 100% 100% 98%    Weight change:  Filed Weights   03/23/14 1600  Weight: 80.786 kg (178 lb 1.6 oz)   Body mass index is 30.56 kg/(m^2).   Gen Exam: Awake and alert with clear speech.   Neck: Supple, No JVD.   Chest: B/L Clear.   CVS: S1 S2 Regular, no murmurs.  Abdomen: soft, BS +, non tender, non distended.  Extremities: no edema, lower extremities warm to touch. Neurologic: Mild left-sided weakness. Skin: No Rash.   Wounds: N/A.    Intake/Output from previous day:  Intake/Output Summary (Last 24 hours) at 03/24/14 1100 Last data filed at 03/24/14 0912  Gross per 24 hour  Intake    240 ml  Output      0 ml  Net    240 ml     LAB RESULTS: CBC  Recent Labs Lab 03/20/14 2156 03/21/14 0440  WBC 6.4 5.7  HGB 11.9* 10.4*  HCT 34.3* 30.7*  PLT 256 242  MCV 88.2 87.7  MCH 30.6 29.7  MCHC 34.7 33.9  RDW 12.4 12.6  LYMPHSABS 2.5  --   MONOABS 0.7  --   EOSABS 0.1  --   BASOSABS 0.0  --     Chemistries   Recent Labs Lab 03/20/14 2156 03/21/14 0440 03/22/14 0657  NA 141 142 141  K 3.8 3.5* 4.0  CL 99 101 101  CO2 30 31 28   GLUCOSE 42* 47* 249*  BUN 25* 26* 18  CREATININE 1.48* 1.35* 1.08  CALCIUM 9.5 8.7 9.0    CBG:  Recent Labs Lab 03/23/14 1247 03/23/14 1647 03/23/14 2052 03/24/14 0653 03/24/14 0835  GLUCAP 142* 98 105* 179* 209*    GFR Estimated Creatinine Clearance: 47 ml/min (by C-G formula based on Cr of 1.08).  Coagulation profile  Recent Labs Lab 03/20/14 2156  INR 1.03    Cardiac Enzymes No results found for this basename: CK, CKMB, TROPONINI, MYOGLOBIN,  in the last 168 hours  No components found with this basename: POCBNP,  No results found for this basename: DDIMER,  in the last 72 hours  Recent Labs  03/21/14 2238  HGBA1C 10.1*   No results found for this basename: CHOL, HDL, LDLCALC,  TRIG, CHOLHDL, LDLDIRECT,  in the last 72 hours  Recent Labs  03/21/14 2238  TSH 0.869   No results found for this basename: VITAMINB12, FOLATE, FERRITIN, TIBC, IRON, RETICCTPCT,  in the last 72 hours No results found for this basename: LIPASE, AMYLASE,  in the last 72 hours  Urine Studies No results found for this basename: UACOL, UAPR, USPG, UPH, UTP, UGL, UKET, UBIL, UHGB, UNIT, UROB, ULEU, UEPI, UWBC, URBC, UBAC, CAST, CRYS, UCOM, BILUA,  in the last 72 hours  MICROBIOLOGY: Recent Results (from the past 240 hour(s))  URINE CULTURE     Status: None   Collection Time    03/20/14 10:36 PM      Result Value Ref Range Status   Specimen Description URINE, CLEAN CATCH   Final   Special Requests  NONE   Final   Culture  Setup Time     Final   Value: 03/21/2014 04:12     Performed at [facility] Count     Final   Value: 30,000 COLONIES/ML     Performed at [facility]   Culture     Final   Value: Multiple bacterial morphotypes present, none predominant. Suggest appropriate recollection if clinically indicated.     Performed at [facility]   Report Status 03/22/2014 FINAL   Final    RADIOLOGY STUDIES/RESULTS: Dg Chest 2 View  03/22/2014   CLINICAL DATA:  Stroke symptoms with history of hypertension and diabetes  EXAM: CHEST  2 VIEW  COMPARISON:  DG CHEST 2 VIEW dated 01/10/2014  FINDINGS: The lungs are adequately inflated and clear. The cardiopericardial silhouette is normal in size. The pulmonary vascularity is not engorged. The mediastinum is normal in width. There is no pleural effusion. The observed portions of the bony thorax appear normal.  IMPRESSION: There is no evidence of pneumonia nor CHF. Mild hyperinflation may be voluntary or could reflect underlying COPD.   Electronically Signed   By: David  [place]   On: 03/22/2014 00:54   Ct Head (brain) Wo Contrast  03/20/2014   CLINICAL DATA:  Left-sided weakness  EXAM: CT HEAD WITHOUT CONTRAST   TECHNIQUE: Contiguous axial images were obtained from the base of the skull through the vertex without intravenous contrast.  COMPARISON:  CT HEAD W/O CM dated 01/14/2014; MR HEAD W/O CM dated 01/09/2014  FINDINGS: There is mild diffuse cerebral and cerebellar atrophy with compensatory ventriculomegaly. These findings are stable. There is decreased density in the deep white matter of both cerebral hemispheres consistent with chronic small vessel ischemic type change. There are old lacunar infarctions in the basal ganglia bilaterally. There is no evidence of an acute intracranial hemorrhage. There is no objective evidence of an acute evolving ischemic infarction. There are basal ganglia calcifications bilaterally which are stable. The cerebellum and brainstem are normal in density.  At bone window settings the observed portions of paranasal sinuses and mastoid air cells are clear. There is no evidence of an acute skull fracture.  IMPRESSION: 1. There is no evidence of an acute ischemic infarction. There are old lacunar infarctions in both cerebral hemispheres, and there is decreased density in the deep white matter of both cerebral hemispheres consistent with chronic small vessel ischemia. 2. There is no acute intracranial hemorrhage. 3. There are stable basal ganglia calcifications bilaterally. 4. These results were called by telephone at the time of interpretation on 03/20/2014 at 10:07 PM to Dr. Roseanne Reno, who verbally acknowledged these results.   Electronically Signed   By: David  [place]   On: 03/20/2014 22:09   Mr Brain Wo Contrast  03/22/2014   CLINICAL DATA:  Evaluate stroke, hypoglycemia, intermittent slurred speech.  EXAM: MRI HEAD WITHOUT CONTRAST  MRA HEAD WITHOUT CONTRAST  TECHNIQUE: Multiplanar, multiecho pulse sequences of the brain and surrounding structures were obtained without intravenous contrast. Angiographic images of the head were obtained using MRA technique without contrast.  COMPARISON:  None.   MR HEAD W/O CM dated 01/09/2014; CT HEAD W/O CM dated 03/20/2014; CT HEAD W/O CM dated 01/14/2014  FINDINGS: MRI HEAD FINDINGS  Punctate focus of reduced diffusion within the left mesial temporal lobe, axial 14/60. Corresponding low ADC values. 7 mm focus of reduced diffusion within the left periventricular frontal white matter, axial 20/60 with corresponding low ADC values. 3  mm focus of reduced diffusion within the left superior cerebellum, with corresponding low ADC values.  Moderately motion degraded gradient sequence, with scattered subcentimeter foci of susceptibility artifact within the supra and infratentorial brain, better seen on prior less motion degraded MRI. No convincing evidence of new micro hemorrhages. No midline shift or mass effect.  Moderate ventriculomegaly, likely on the basis of global parenchymal brain volume loss as there is overall commensurate enlargement cerebral sulci and cerebellar folia. Severe white matter changes, including pontine T2 hyperintensities are similar. Remote bilateral basal ganglia, thalamic and cerebellar infarcts. No midline shift or mass effect.  No abnormal extra-axial fluid collections. Status post bilateral ocular lens implants. Patient is edentulous. No abnormal sellar expansion. No cerebellar tonsillar ectopia.  MRA HEAD FINDINGS  Anterior circulation: Mildly motion degraded evaluation limits the sensitivity.  Anterior circulation: Normal flow related enhancement of the included cervical, petrous, cavernous internal carotid arteries. However, suspected at least 50% stenosis of the left anterior genu of the internal carotid artery corresponding to coarse calcification on prior head CT. Finding may be accentuated by patient motion. Normal flow related enhancement of the supra clinoid internal carotid arteries, the right is more robust. Patent flow related enhancement of the anterior and middle cerebral arteries. At least 50% narrowing of the origin of a left M2 branch  similar to worse.  Posterior circulation: Motion degrades evaluation. Thready flow of the left vertebral artery throughout the intradural segment, similar to prior examination. Basilar artery is patent, patent main branch vessels. Posterior communicating arteries are not distinctly identified. Focal high-grade stenosis of the right P2 segment, axial 97/160. Normal flow within the enhancement of the P3 segments.  No convincing evidence of aneurysm. No large vessel occlusion. Mild irregularity of the intracranial vessels diffusely most consistent with atherosclerosis.  IMPRESSION: MRI head: Left mesial temporal lobe, left periventricular frontal white matter and left superior cerebellar subcentimeter foci of acute ischemia. These span multiple vascular territories, and may reflect thromboembolic disease without acute large territory infarct.  Moderately motion degraded examination. Scattered susceptibility artifact most consistent with sequelae of chronic hypertension, with multiple remote basal ganglia/thalamic and cerebellar infarcts. Severe white matter changes suggest chronic small vessel ischemic disease.  MRA head: Motion degraded examination. Multifocal intracranial mid to high-grade stenosis, including anterior genu of the left internal carotid artery, left M2, right P2 and left distal vertebral arteries, relatively unchanged for this motion degraded examination.  Diffuse mild intracranial luminal irregularity compatible with atherosclerosis.   Electronically Signed   By: Awilda Metro   On: 03/22/2014 01:04   Mr Maxine Glenn Head/brain Wo Cm  03/22/2014   CLINICAL DATA:  Evaluate stroke, hypoglycemia, intermittent slurred speech.  EXAM: MRI HEAD WITHOUT CONTRAST  MRA HEAD WITHOUT CONTRAST  TECHNIQUE: Multiplanar, multiecho pulse sequences of the brain and surrounding structures were obtained without intravenous contrast. Angiographic images of the head were obtained using MRA technique without contrast.   COMPARISON:  None.  MR HEAD W/O CM dated 01/09/2014; CT HEAD W/O CM dated 03/20/2014; CT HEAD W/O CM dated 01/14/2014  FINDINGS: MRI HEAD FINDINGS  Punctate focus of reduced diffusion within the left mesial temporal lobe, axial 14/60. Corresponding low ADC values. 7 mm focus of reduced diffusion within the left periventricular frontal white matter, axial 20/60 with corresponding low ADC values. 3 mm focus of reduced diffusion within the left superior cerebellum, with corresponding low ADC values.  Moderately motion degraded gradient sequence, with scattered subcentimeter foci of susceptibility artifact within the supra and infratentorial brain, better  seen on prior less motion degraded MRI. No convincing evidence of new micro hemorrhages. No midline shift or mass effect.  Moderate ventriculomegaly, likely on the basis of global parenchymal brain volume loss as there is overall commensurate enlargement cerebral sulci and cerebellar folia. Severe white matter changes, including pontine T2 hyperintensities are similar. Remote bilateral basal ganglia, thalamic and cerebellar infarcts. No midline shift or mass effect.  No abnormal extra-axial fluid collections. Status post bilateral ocular lens implants. Patient is edentulous. No abnormal sellar expansion. No cerebellar tonsillar ectopia.  MRA HEAD FINDINGS  Anterior circulation: Mildly motion degraded evaluation limits the sensitivity.  Anterior circulation: Normal flow related enhancement of the included cervical, petrous, cavernous internal carotid arteries. However, suspected at least 50% stenosis of the left anterior genu of the internal carotid artery corresponding to coarse calcification on prior head CT. Finding may be accentuated by patient motion. Normal flow related enhancement of the supra clinoid internal carotid arteries, the right is more robust. Patent flow related enhancement of the anterior and middle cerebral arteries. At least 50% narrowing of the origin  of a left M2 branch similar to worse.  Posterior circulation: Motion degrades evaluation. Thready flow of the left vertebral artery throughout the intradural segment, similar to prior examination. Basilar artery is patent, patent main branch vessels. Posterior communicating arteries are not distinctly identified. Focal high-grade stenosis of the right P2 segment, axial 97/160. Normal flow within the enhancement of the P3 segments.  No convincing evidence of aneurysm. No large vessel occlusion. Mild irregularity of the intracranial vessels diffusely most consistent with atherosclerosis.  IMPRESSION: MRI head: Left mesial temporal lobe, left periventricular frontal white matter and left superior cerebellar subcentimeter foci of acute ischemia. These span multiple vascular territories, and may reflect thromboembolic disease without acute large territory infarct.  Moderately motion degraded examination. Scattered susceptibility artifact most consistent with sequelae of chronic hypertension, with multiple remote basal ganglia/thalamic and cerebellar infarcts. Severe white matter changes suggest chronic small vessel ischemic disease.  MRA head: Motion degraded examination. Multifocal intracranial mid to high-grade stenosis, including anterior genu of the left internal carotid artery, left M2, right P2 and left distal vertebral arteries, relatively unchanged for this motion degraded examination.  Diffuse mild intracranial luminal irregularity compatible with atherosclerosis.   Electronically Signed   By: Awilda Metro   On: 03/22/2014 01:04    Ciarra Braddy Levora Dredge, MD  [facility] Pager:[phone]  If 7PM-7AM, please contact night-coverage [email or web address] Password [email or web address] 03/24/2014, 11:00 AM   LOS: 4 days   **Disclaimer: This note may have been dictated with voice recognition software. Similar sounding words can inadvertently be transcribed and this note may contain transcription errors which may not  have been corrected upon publication of note.**

## 2014-03-24 NOTE — Progress Notes (Signed)
Pt discharge education completed with pt with spouse and home caretaker at bedside. All voices understanding and denies any questions. All lines including IV and telemetry removed from pt. Pt handed a print out education on stroke; her ordered prescriptions. Pt educated on smoking and she voices quitting smoking. Pt loop recorder dsg site slightly stained but remains intact. Pt transported off unit via wheelchair with spouse, belongings and caretaker at side. Arabella MerlesP. Amo Jera Headings RN.

## 2014-03-24 NOTE — Progress Notes (Signed)
Talked to patient again about DCP; earlier this am she was agreeable to go to [facility] rehab but now [facility]; [facility]; Olegario MessierKathy with [facility] called for arrangements; no DME needed ( patient has equipment from last hospitalization). Abelino DerrickB Mirca Yale RN,BSN,MHA [phone]

## 2014-03-24 NOTE — Progress Notes (Signed)
I met with pt at bedside to discuss admission to inpt rehab today with insurance approval. Pt states she wants one more day of therapy and then to go home for she has issues with her [employer] housing that she has to arrange by 5/26. She has a [employer] aide 3 hrs per day that assists her with adls and cooking and she requests home therapy to be arranged. I have contacted RN CM to arrange. Pt is aware that her choices were inpt rehab admission today or home with [facility] today. [phone]

## 2014-03-24 NOTE — Progress Notes (Signed)
Stroke Team Progress Note  HISTORY Erin FullingBetty J [name] is a 73 y.o. female with a history of hypertension, hyperlipidemia, diabetes mellitus and a stroke in March 2015, presenting with exacerbation of left-sided weakness as well as slurred speech. Onset was at 9 PM on 03/20/2014. She was also noted to be hypoglycemic with blood sugar 42. She was given one amp of D50. Repeat blood sugar was 148. Weakness of the left side was persistent, particularly involving her left leg. CT scan of her head showed no acute intracranial abnormality. Patient has been on Plavix for antiplatelet therapy. NIH stroke score was 4.   tPA Given: No: Recent stroke (01/09/2014).   SUBJECTIVE The patient's husband is at the bedside. The patient had the loop recorder implanted. She understands that the plan is for inpatient rehabilitation. She appears more alert today.  OBJECTIVE Most recent Vital Signs: Filed Vitals:   03/23/14 1721 03/23/14 2205 03/24/14 0148 03/24/14 0459  BP: 143/68 150/60 166/62 153/83  Pulse: 75 85 78 74  Temp: 98.5 F (36.9 C) 97.7 F (36.5 C) 98 F (36.7 C) 98.5 F (36.9 C)  TempSrc: Oral Oral Oral Oral  Resp: 20 18 18 18   Height:      Weight:      SpO2: 98% 100% 100% 100%   CBG (last 3)   Recent Labs  03/23/14 2052 03/24/14 0653 03/24/14 0835  GLUCAP 105* 179* 209*    IV Fluid Intake:      MEDICATIONS  . atorvastatin  40 mg Oral q1800  . benazepril  10 mg Oral Daily  . cholecalciferol  2,000 Units Oral Daily  . clopidogrel  75 mg Oral Q breakfast  . enoxaparin (LOVENOX) injection  40 mg Subcutaneous Daily  . fluticasone  1 spray Each Nare Daily  . gabapentin  300 mg Oral BID  . hydrochlorothiazide  25 mg Oral Daily  . insulin aspart  0-9 Units Subcutaneous TID WC  . insulin aspart protamine- aspart  15 Units Subcutaneous BID WC  . levothyroxine  50 mcg Oral QAC breakfast  . loratadine  10 mg Oral Daily  . metoprolol succinate  25 mg Oral BID  . pantoprazole  80 mg Oral  Daily  . pneumococcal 23 valent vaccine  0.5 mL Intramuscular Tomorrow-1000   PRN:  acetaminophen, albuterol, diphenhydrAMINE, hydrALAZINE, senna-docusate  Diet:  Dysphagia 3 diet with thin liquids. Activity:  Up with assistance DVT Prophylaxis:  Lovenox  CLINICALLY SIGNIFICANT STUDIES Basic Metabolic Panel:   Recent Labs Lab 03/21/14 0440 03/22/14 0657  NA 142 141  K 3.5* 4.0  CL 101 101  CO2 31 28  GLUCOSE 47* 249*  BUN 26* 18  CREATININE 1.35* 1.08  CALCIUM 8.7 9.0   Liver Function Tests:   Recent Labs Lab 03/20/14 2156  AST 15  ALT 7  ALKPHOS 62  BILITOT <0.2*  PROT 7.4  ALBUMIN 3.6   CBC:   Recent Labs Lab 03/20/14 2156 03/21/14 0440  WBC 6.4 5.7  NEUTROABS 3.2  --   HGB 11.9* 10.4*  HCT 34.3* 30.7*  MCV 88.2 87.7  PLT 256 242   Coagulation:   Recent Labs Lab 03/20/14 2156  LABPROT 13.3  INR 1.03   Cardiac Enzymes: No results found for this basename: CKTOTAL, CKMB, CKMBINDEX, TROPONINI,  in the last 168 hours Urinalysis:   Recent Labs Lab 03/20/14 2236  COLORURINE YELLOW  LABSPEC 1.009  PHURINE 6.5  GLUCOSEU 250*  HGBUR NEGATIVE  BILIRUBINUR NEGATIVE  KETONESUR NEGATIVE  PROTEINUR  30*  UROBILINOGEN 0.2  NITRITE NEGATIVE  LEUKOCYTESUR NEGATIVE   Lipid Panel    Component Value Date/Time   CHOL 186 03/21/2014 0440   TRIG 150* 03/21/2014 0440   HDL 48 03/21/2014 0440   CHOLHDL 3.9 03/21/2014 0440   VLDL 30 03/21/2014 0440   LDLCALC 108* 03/21/2014 0440   HgbA1C  Lab Results  Component Value Date   HGBA1C 10.1* 03/21/2014    Urine Drug Screen:     Component Value Date/Time   LABOPIA NONE DETECTED 01/09/2014 2054   COCAINSCRNUR NONE DETECTED 01/09/2014 2054   LABBENZ NONE DETECTED 01/09/2014 2054   AMPHETMU NONE DETECTED 01/09/2014 2054   THCU NONE DETECTED 01/09/2014 2054   LABBARB NONE DETECTED 01/09/2014 2054    Alcohol Level: No results found for this basename: ETH,  in the last 168 hours  Ct Head (brain) Wo Contrast 03/20/2014     1. There is no evidence of an acute ischemic infarction. There are old lacunar infarctions in both cerebral hemispheres, and there is decreased density in the deep white matter of both cerebral hemispheres consistent with chronic small vessel ischemia.  2. There is no acute intracranial hemorrhage.  3. There are stable basal ganglia calcifications bilaterally.   MRI head  Left mesial temporal lobe, left periventricular frontal white matter and left superior cerebellar subcentimeter foci of acute ischemia. These span multiple vascular territories, and may reflect thromboembolic disease without acute large territory infarct.  Moderately motion degraded examination. Scattered susceptibility artifact most consistent with sequelae of chronic hypertension, with multiple remote basal ganglia/thalamic and cerebellar infarcts.Severe white matter changes suggest chronic small vessel ischemic disease.  MRA head Motion degraded examination. Multifocal intracranial mid to high-grade stenosis, including anterior genu of the left internal carotid artery, left M2, right P2 and left distal vertebral arteries, relatively unchanged for this motion degraded examination. Diffuse mild intracranial luminal irregularity compatible with atherosclerosis.  2D Echocardiogram  01/10/2014 ejection fraction 60-65%. No cardiac source of emboli identified.  TEE - ejection fraction 60-65%. Positive bubble study for a PFO   Bilateral lower extremity venous duplex  Preliminary report: Bilateral: No evidence of DVT, superficial thrombosis, or Baker's Cyst.   Carotid Doppler 01/11/2014  Right: Mild focal calcific plaque distal CCA and origin ICA. Left: Moderate calcific plaque with acoustic shadowing origin ICA.  Bilateral: 1-39% ICA stensois. Vertebral artery flow is antegrade.  CXR  There is no evidence of pneumonia nor CHF. Mild hyperinflation may be voluntary or could reflect underlying COPD.  EKG  Sinus rhythm rate  74 beats per minute. For complete results please see formal report.   Therapy Recommendations rehabilitation consult recommend for inpatient rehabilitation  Physical Exam   Neurologic Examination:  Mental Status:  Lethargic but arousable, oriented, thought content appropriate. Speech slightly slurred without evidence of aphasia. Able to follow most commands with prompting. Poorly cooperative with exam. Cranial Nerves:  II-difficult to assess due to the poor visual acuity, including with glasses.  III/IV/VI-Pupils were equal and reacted. Extraocular movements were full and conjugate.  V/VII-slight left facial numbness to tactile sensation; mild left lower facial weakness.  VIII-normal.  X- mild dysarthria.  Motor: Moderately severe weakness of left lower extremity proximally; mild weakness of the left upper extremity.  Appears abnormal strength of the right upper and lower extremities; flaccid muscle tone throughout.  Sensory: Reduced perception of tactile sensation over left extremities compared to right extremities.  Deep Tendon Reflexes: Trace to 1+ and symmetric.  Plantars: Mute bilaterally  Cerebellar: Difficult  to assess because of her poor vision.   ASSESSMENT Ms. [name] [name] is a 73 y.o. female presenting with left hemiparesis and dysarthria. MRI confirms left mesial temporal lobe, left periventricular frontal white matter and left superior cerebellar small infarct in setting pf multiple remote basal ganglia/thalamic and cerebellar infarcts. Given new, multi-vascular territories in setting of old strokes if other territories, concern for embolic source. Patient does have small vessel ischemic disease as etiology of old lacunar infarcts. On clopidogrel 75 mg orally every day prior to admission. Now on clopidogrel 75 mg orally every day for secondary stroke prevention. Patient with resultant left hemiplegia and left hemisensory deficits. Stroke work up underway.   Hyperlipidemia -  Lipitor prior to admission. Cholesterol 186 LDL 108.  Diabetes mellitus - hemoglobin A1c 10.4 in March 2015. Goal less than 7  Hypoglycemic on admission with glucose in the 40s.  Hypertension  Chronic renal insufficiency, CKD stage III  Anemia  Hx left periatrial white matter stroke secondary to small vessel disease March 2015   Hx cerebellar stroke  Legally blind  PVD  Hospital day # 4  TREATMENT/PLAN  Continue clopidogrel 75 mg orally every day for secondary stroke prevention. TEE - positive for PFO. Lower extremity Dopplers negative for DVT. Loop implanted.  Risk factor modification. Needs better glucose control.  Disp:  IP Rehabilitation recommended by therapy; PM&R agrees  Patient is not medically ready for discharge today. IP rehab will be necessary prior to discharge per Dr. Pearlean Brownie.  Delton See PA-C [facility] Pager [phone] 03/24/2014, 9:28 AM  I have personally obtained a history, examined the patient, evaluated imaging results, and formulated the assessment and plan of care. I agree with the above.   Elspeth Cho, DO [facility] Pager: [phone]  To contact Stroke Continuity provider, please refer to [email or web address]. After hours, contact General Neurology

## 2014-03-24 NOTE — Progress Notes (Signed)
Talked to patient with spouse present about DCP; patient is agreeable to go to CIR prior to returning home; all questions answered; [name] GoodellB Cleatus Gabriel RN,BSN,MHA [phone]

## 2014-03-24 NOTE — Progress Notes (Signed)
0740 pt not in room. Pt picked up to Vascular for Loop recorder implant. [name] MerlesP. Amo Cregg Jutte RN.

## 2014-03-24 NOTE — Progress Notes (Signed)
0815 back to room via stretcher from procedure. Pt dsg to chest clean, dry and intact. Pt denies any pain and in bed with spouse at bedside. Call light within reach and will continue to monitor quietly. P. Amo Roxie Gueye Rn.

## 2014-04-03 ENCOUNTER — Ambulatory Visit (INDEPENDENT_AMBULATORY_CARE_PROVIDER_SITE_OTHER): Payer: Medicare HMO | Admitting: *Deleted

## 2014-04-03 DIAGNOSIS — I6350 Cerebral infarction due to unspecified occlusion or stenosis of unspecified cerebral artery: Secondary | ICD-10-CM

## 2014-04-03 LAB — MDC_IDC_ENUM_SESS_TYPE_INCLINIC

## 2014-04-03 NOTE — Progress Notes (Signed)
Wound check ILR.  Wound well healed.  No episodes noted.  Follow up  [facility].

## 2014-04-07 ENCOUNTER — Emergency Department (HOSPITAL_COMMUNITY): Payer: Medicare HMO

## 2014-04-07 ENCOUNTER — Encounter (HOSPITAL_COMMUNITY): Payer: Self-pay | Admitting: Emergency Medicine

## 2014-04-07 ENCOUNTER — Emergency Department (HOSPITAL_COMMUNITY)
Admission: EM | Admit: 2014-04-07 | Discharge: 2014-04-07 | Disposition: A | Discharge status: Home / Self Care | Payer: Medicare HMO | Attending: Emergency Medicine | Admitting: Emergency Medicine

## 2014-04-07 DIAGNOSIS — E1169 Type 2 diabetes mellitus with other specified complication: Secondary | ICD-10-CM | POA: Insufficient documentation

## 2014-04-07 DIAGNOSIS — Z8739 Personal history of other diseases of the musculoskeletal system and connective tissue: Secondary | ICD-10-CM | POA: Insufficient documentation

## 2014-04-07 DIAGNOSIS — Z880 Allergy status to penicillin: Secondary | ICD-10-CM | POA: Insufficient documentation

## 2014-04-07 DIAGNOSIS — E162 Hypoglycemia, unspecified: Secondary | ICD-10-CM

## 2014-04-07 DIAGNOSIS — R269 Unspecified abnormalities of gait and mobility: Secondary | ICD-10-CM | POA: Insufficient documentation

## 2014-04-07 DIAGNOSIS — R059 Cough, unspecified: Secondary | ICD-10-CM | POA: Insufficient documentation

## 2014-04-07 DIAGNOSIS — Z8673 Personal history of transient ischemic attack (TIA), and cerebral infarction without residual deficits: Secondary | ICD-10-CM | POA: Insufficient documentation

## 2014-04-07 DIAGNOSIS — Z794 Long term (current) use of insulin: Secondary | ICD-10-CM | POA: Insufficient documentation

## 2014-04-07 DIAGNOSIS — E785 Hyperlipidemia, unspecified: Secondary | ICD-10-CM | POA: Insufficient documentation

## 2014-04-07 DIAGNOSIS — Z7902 Long term (current) use of antithrombotics/antiplatelets: Secondary | ICD-10-CM | POA: Insufficient documentation

## 2014-04-07 DIAGNOSIS — N189 Chronic kidney disease, unspecified: Secondary | ICD-10-CM | POA: Insufficient documentation

## 2014-04-07 DIAGNOSIS — I129 Hypertensive chronic kidney disease with stage 1 through stage 4 chronic kidney disease, or unspecified chronic kidney disease: Secondary | ICD-10-CM | POA: Insufficient documentation

## 2014-04-07 DIAGNOSIS — Z79899 Other long term (current) drug therapy: Secondary | ICD-10-CM | POA: Insufficient documentation

## 2014-04-07 DIAGNOSIS — F17200 Nicotine dependence, unspecified, uncomplicated: Secondary | ICD-10-CM | POA: Insufficient documentation

## 2014-04-07 DIAGNOSIS — R5383 Other fatigue: Secondary | ICD-10-CM

## 2014-04-07 DIAGNOSIS — R5381 Other malaise: Secondary | ICD-10-CM | POA: Insufficient documentation

## 2014-04-07 DIAGNOSIS — R05 Cough: Secondary | ICD-10-CM | POA: Insufficient documentation

## 2014-04-07 LAB — CBC WITH DIFFERENTIAL/PLATELET
Basophils Absolute: 0 10*3/uL (ref 0.0–0.1)
Basophils Relative: 0 % (ref 0–1)
Eosinophils Absolute: 0 10*3/uL (ref 0.0–0.7)
Eosinophils Relative: 0 % (ref 0–5)
HCT: 34.2 % — ABNORMAL LOW (ref 36.0–46.0)
HEMOGLOBIN: 11.6 g/dL — AB (ref 12.0–15.0)
Lymphocytes Relative: 21 % (ref 12–46)
Lymphs Abs: 1.2 10*3/uL (ref 0.7–4.0)
MCH: 29.7 pg (ref 26.0–34.0)
MCHC: 33.9 g/dL (ref 30.0–36.0)
MCV: 87.7 fL (ref 78.0–100.0)
MONOS PCT: 7 % (ref 3–12)
Monocytes Absolute: 0.4 10*3/uL (ref 0.1–1.0)
NEUTROS ABS: 4.2 10*3/uL (ref 1.7–7.7)
Neutrophils Relative %: 72 % (ref 43–77)
Platelets: 188 10*3/uL (ref 150–400)
RBC: 3.9 MIL/uL (ref 3.87–5.11)
RDW: 12.7 % (ref 11.5–15.5)
WBC: 5.8 10*3/uL (ref 4.0–10.5)

## 2014-04-07 LAB — URINALYSIS, ROUTINE W REFLEX MICROSCOPIC
Bilirubin Urine: NEGATIVE
Glucose, UA: NEGATIVE mg/dL
HGB URINE DIPSTICK: NEGATIVE
Ketones, ur: NEGATIVE mg/dL
LEUKOCYTES UA: NEGATIVE
Nitrite: NEGATIVE
Protein, ur: 30 mg/dL — AB
SPECIFIC GRAVITY, URINE: 1.014 (ref 1.005–1.030)
UROBILINOGEN UA: 0.2 mg/dL (ref 0.0–1.0)
pH: 5.5 (ref 5.0–8.0)

## 2014-04-07 LAB — COMPREHENSIVE METABOLIC PANEL
ALT: 6 U/L (ref 0–35)
AST: 17 U/L (ref 0–37)
Albumin: 3.5 g/dL (ref 3.5–5.2)
Alkaline Phosphatase: 59 U/L (ref 39–117)
BUN: 26 mg/dL — ABNORMAL HIGH (ref 6–23)
CALCIUM: 9.5 mg/dL (ref 8.4–10.5)
CO2: 22 meq/L (ref 19–32)
CREATININE: 1.58 mg/dL — AB (ref 0.50–1.10)
Chloride: 106 mEq/L (ref 96–112)
GFR calc Af Amer: 36 mL/min — ABNORMAL LOW (ref >90)
GFR calc non Af Amer: 31 mL/min — ABNORMAL LOW (ref >90)
Glucose, Bld: 27 mg/dL — CL (ref 70–99)
Potassium: 4.4 mEq/L (ref 3.7–5.3)
Sodium: 142 mEq/L (ref 137–147)
Total Bilirubin: <0.2 mg/dL — ABNORMAL LOW (ref 0.3–1.2)
Total Protein: 7.2 g/dL (ref 6.0–8.3)

## 2014-04-07 LAB — CBG MONITORING, ED
GLUCOSE-CAPILLARY: 159 mg/dL — AB (ref 70–99)
GLUCOSE-CAPILLARY: 20 mg/dL — AB (ref 70–99)
Glucose-Capillary: 162 mg/dL — ABNORMAL HIGH (ref 70–99)
Glucose-Capillary: 183 mg/dL — ABNORMAL HIGH (ref 70–99)
Glucose-Capillary: 190 mg/dL — ABNORMAL HIGH (ref 70–99)

## 2014-04-07 LAB — URINE MICROSCOPIC-ADD ON

## 2014-04-07 LAB — POC OCCULT BLOOD, ED: Fecal Occult Bld: NEGATIVE

## 2014-04-07 MED ORDER — DEXTROSE 50 % IV SOLN
1.0000 | Freq: Once | INTRAVENOUS | Status: AC
  Administered 2014-04-07: 50 mL via INTRAVENOUS

## 2014-04-07 MED ORDER — GLUCOSE 40 % PO GEL
ORAL | Status: AC
  Administered 2014-04-07: 37.5 g
  Filled 2014-04-07: qty 1

## 2014-04-07 MED ORDER — DEXTROSE 50 % IV SOLN
INTRAVENOUS | Status: AC
  Administered 2014-04-07: 50 mL via INTRAVENOUS
  Filled 2014-04-07: qty 50

## 2014-04-07 NOTE — Discharge Instructions (Signed)
Hypoglycemia (Low Blood Sugar) Stop insulin. You are on pills for diabetes called metformin. Please continue those as well as all of your other medications. Check your blood sugars each day before each meal and at bedtime. Write the numbers down and show them to Dr. Renae Gloss. Dr. Mathews Robinsons office will call you today or tomorrow to schedule an appointment within the next few days. She may need to adjust your diabetes medications at the office visit. Ask her to recheck your blood pressure at the office visit as well. Today's was elevated at 173/57 Hypoglycemia is when the glucose (sugar) in your blood is too low. Hypoglycemia can happen for many reasons. It can happen to people with or without diabetes. Hypoglycemia can develop quickly and can be a medical emergency.  CAUSES  Having hypoglycemia does not mean that you will develop diabetes. Different causes include:  Missed or delayed meals or not enough carbohydrates eaten.  Medication overdose. This could be by accident or deliberate. If by accident, your medication may need to be adjusted or changed.  Exercise or increased activity without adjustments in carbohydrates or medications.  A nerve disorder that affects body functions like your heart rate, blood pressure and digestion (autonomic neuropathy).  A condition where the stomach muscles do not function properly (gastroparesis). Therefore, medications may not absorb properly.  The inability to recognize the signs of hypoglycemia (hypoglycemic unawareness).  Absorption of insulin  may be altered.  Alcohol consumption.  Pregnancy/menstrual cycles/postpartum. This may be due to hormones.  Certain kinds of tumors. This is very rare. SYMPTOMS   Sweating.  Hunger.  Dizziness.  Blurred vision.  Drowsiness.  Weakness.  Headache.  Rapid heart beat.  Shakiness.  Nervousness. DIAGNOSIS  Diagnosis is made by monitoring blood glucose in one or all of the following  ways:  Fingerstick blood glucose monitoring.  Laboratory results. TREATMENT  If you think your blood glucose is low:  Check your blood glucose, if possible. If it is less than 70 mg/dl, take one of the following:  3-4 glucose tablets.   cup juice (prefer clear like apple).   cup "regular" soda pop.  1 cup milk.  -1 tube of glucose gel.  5-6 hard candies.  Do not over treat because your blood glucose (sugar) will only go too high.  Wait 15 minutes and recheck your blood glucose. If it is still less than 70 mg/dl (or below your target range), repeat treatment.  Eat a snack if it is more than one hour until your next meal. Sometimes, your blood glucose may go so low that you are unable to treat yourself. You may need someone to help you. You may even pass out or be unable to swallow. This may require you to get an injection of glucagon, which raises the blood glucose. HOME CARE INSTRUCTIONS  Check blood glucose as recommended by your caregiver.  Take medication as prescribed by your caregiver.  Follow your meal plan. Do not skip meals. Eat on time.  If you are going to drink alcohol, drink it only with meals.  Check your blood glucose before driving.  Check your blood glucose before and after exercise. If you exercise longer or different than usual, be sure to check blood glucose more frequently.  Always carry treatment with you. Glucose tablets are the easiest to carry.  Always wear medical alert jewelry or carry some form of identification that states that you have diabetes. This will alert people that you have diabetes. If you have hypoglycemia,  they will have a better idea on what to do. SEEK MEDICAL CARE IF:   You are having problems keeping your blood sugar at target range.  You are having frequent episodes of hypoglycemia.  You feel you might be having side effects from your medicines.  You have symptoms of an illness that is not improving after 3-4  days.  You notice a change in vision or a new problem with your vision. SEEK IMMEDIATE MEDICAL CARE IF:   You are a family member or friend of a person whose blood glucose goes below 70 mg/dl and is accompanied by:  Confusion.  A change in mental status.  The inability to swallow.  Passing out. Document Released: 10/23/2005 Document Revised: 01/15/2012 Document Reviewed: 02/19/2012 [facility] Patient Information 2014 [place], [place].

## 2014-04-07 NOTE — ED Notes (Signed)
Staff assisted pt to standing position and took 6 steps without difficulties.

## 2014-04-07 NOTE — ED Provider Notes (Signed)
CSN: [id number]     Arrival date & time 04/07/14  0708 History   First MD Initiated Contact with Patient 04/07/14 0710     Chief Complaint  Patient presents with  . Altered Mental Status   Level V caveat altered mental status. History obtained from patient and from patient's husband and from EMS  (Consider location/radiation/quality/duration/timing/severity/associated sxs/prior Treatment) HPI Patient reports generalized weakness since last night. She states "my blood sugars have been low in the 60s or 70s". Brought by EMS EMS checked CBG which was 74 while en route. Patient denies pain anywhere. Past Medical History  Diagnosis Date  . Hypertension   . Diabetes mellitus   . Chronic renal insufficiency   . Hyperlipidemia   . Osteopenia   . CVA (cerebral infarction)    Past Surgical History  Procedure Laterality Date  . Eye surgery    . Loop recorder implant  03-24-2014    MDT LinQ implanted by Dr Johney Frame for cryptogenic stroke  . Tee without cardioversion N/A 03/23/2014    Procedure: TRANSESOPHAGEAL ECHOCARDIOGRAM (TEE);  Surgeon: Lars Masson, MD;  Location: [facility] ENDOSCOPY;  Service: Cardiovascular;  Laterality: N/A;   Family History  Problem Relation Age of Onset  . Diabetes Mother   . Hypertension Mother    History  Substance Use Topics  . Smoking status: Current Every Day Smoker -- 0.50 packs/day for 60 years    Types: Cigarettes  . Smokeless tobacco: Not on file     Comment: pt states "sometimes I quit smoking for about 2 weeks then I start back"  . Alcohol Use: Yes     Comment: occasionally   OB History   Grav Para Term Preterm Abortions TAB SAB Ect Mult Living                 Review of Systems  Unable to perform ROS: Mental status change  HENT: Negative.   Respiratory: Negative.   Cardiovascular: Negative.   Gastrointestinal: Negative.   Musculoskeletal: Positive for gait problem.       Walks with walker  Skin: Negative.   Neurological: Positive for  weakness.  Psychiatric/Behavioral: Negative.       Allergies  Erythromycin; Iodine; and Penicillins  Home Medications   Prior to Admission medications   Medication Sig Start Date End Date Taking? Authorizing Provider  atorvastatin (LIPITOR) 20 MG tablet Take 2 tablets (40 mg total) by mouth daily at 6 PM. 03/24/14   Shanker Levora Dredge, MD  bimatoprost (LUMIGAN) 0.01 % SOLN Place 1 drop into both eyes at bedtime. 03/24/14   Shanker Levora Dredge, MD  Cholecalciferol (VITAMIN D3) 2000 UNITS TABS Take 2,000 Units by mouth daily. 03/24/14   Shanker Levora Dredge, MD  clopidogrel (PLAVIX) 75 MG tablet Take 1 tablet (75 mg total) by mouth daily with breakfast. 03/24/14   Maretta Bees, MD  diphenhydrAMINE (BENADRYL) 25 MG tablet Take 1 tablet (25 mg total) by mouth every 6 (six) hours as needed for allergies. 03/24/14   Shanker Levora Dredge, MD  gabapentin (NEURONTIN) 300 MG capsule Take 1 capsule (300 mg total) by mouth 2 (two) times daily. 03/24/14   Shanker Levora Dredge, MD  hydrochlorothiazide (HYDRODIURIL) 25 MG tablet Take 1 tablet (25 mg total) by mouth daily. 03/24/14   Shanker Levora Dredge, MD  insulin NPH-regular Human (NOVOLIN 70/30) (70-30) 100 UNIT/ML injection Inject 15 Units into the skin 2 (two) times daily with a meal. 03/24/14   Shanker Levora Dredge, MD  levothyroxine (SYNTHROID,  LEVOTHROID) 50 MCG tablet Take 1 tablet (50 mcg total) by mouth daily. 03/24/14   Shanker Levora Dredge, MD  lisinopril (PRINIVIL,ZESTRIL) 20 MG tablet Take 1 tablet (20 mg total) by mouth daily. 03/24/14   Shanker Levora Dredge, MD  metFORMIN (GLUCOPHAGE) 1000 MG tablet Take 1 tablet (1,000 mg total) by mouth 2 (two) times daily with a meal. 03/24/14   Shanker Levora Dredge, MD  metoprolol succinate (TOPROL-XL) 25 MG 24 hr tablet Take 1 tablet (25 mg total) by mouth 2 (two) times daily. 03/24/14   Shanker Levora Dredge, MD  Olopatadine HCl (PATADAY) 0.2 % SOLN Place 1 drop into both eyes daily. 03/24/14   Shanker Levora Dredge, MD  omeprazole  (PRILOSEC) 40 MG capsule Take 1 capsule (40 mg total) by mouth daily. 03/24/14   Shanker Levora Dredge, MD  potassium chloride SA (K-DUR,KLOR-CON) 20 MEQ tablet Take 1 tablet (20 mEq total) by mouth daily. 03/24/14   Shanker Levora Dredge, MD   BP 182/61  Pulse 76  Temp(Src) 97 F (36.1 C) (Rectal)  Resp 22  SpO2 98% Physical Exam  Nursing note and vitals reviewed. Constitutional: She appears well-developed and well-nourished.  HENT:  Head: Normocephalic and atraumatic.  Eyes: Conjunctivae are normal. Pupils are equal, round, and reactive to light.  Neck: Neck supple. No tracheal deviation present. No thyromegaly present.  Cardiovascular: Normal rate and regular rhythm.   No murmur heard. Pulmonary/Chest: Effort normal and breath sounds normal.  Coughing  Abdominal: Soft. Bowel sounds are normal. She exhibits no distension. There is no tenderness.  Obese  Genitourinary: Guaiac negative stool.  Rectal normal tone brown stool Hemoccult negative  Musculoskeletal: Normal range of motion. She exhibits no edema and no tenderness.  Neurological: She is alert. She has normal reflexes. Coordination normal.  DTRs symmetric bilaterally knee jerk ankle jerk biceps was ordered bilaterally. Oriented to name and hospital does not know month or year follow simple commands motor strength 5 over 5 overall moves all extremities  Skin: Skin is warm and dry. No rash noted.  Psychiatric: She has a normal mood and affect.    ED Course  Procedures (including critical care time) Labs Review Labs Reviewed  COMPREHENSIVE METABOLIC PANEL  CBC WITH DIFFERENTIAL  URINALYSIS, ROUTINE W REFLEX MICROSCOPIC  TROPONIN I  POC OCCULT BLOOD, ED  POC OCCULT BLOOD, ED  CBG MONITORING, ED    Imaging Review No results found.   EKG Interpretation None     1: 20 p.m. patient remains asymptomatic. She is able to ambulate with minimal assistance.  Results for orders placed during the hospital encounter of 04/07/14   COMPREHENSIVE METABOLIC PANEL      Result Value Ref Range   Sodium 142  137 - 147 mEq/L   Potassium 4.4  3.7 - 5.3 mEq/L   Chloride 106  96 - 112 mEq/L   CO2 22  19 - 32 mEq/L   Glucose, Bld 27 (*) 70 - 99 mg/dL   BUN 26 (*) 6 - 23 mg/dL   Creatinine, Ser 1.61 (*) 0.50 - 1.10 mg/dL   Calcium 9.5  8.4 - 09.6 mg/dL   Total Protein 7.2  6.0 - 8.3 g/dL   Albumin 3.5  3.5 - 5.2 g/dL   AST 17  0 - 37 U/L   ALT 6  0 - 35 U/L   Alkaline Phosphatase 59  39 - 117 U/L   Total Bilirubin <0.2 (*) 0.3 - 1.2 mg/dL   GFR calc non Af Denyse Dago  31 (*) >90 mL/min   GFR calc Af Amer 36 (*) >90 mL/min  CBC WITH DIFFERENTIAL      Result Value Ref Range   WBC 5.8  4.0 - 10.5 K/uL   RBC 3.90  3.87 - 5.11 MIL/uL   Hemoglobin 11.6 (*) 12.0 - 15.0 g/dL   HCT 16.1 (*) 09.6 - 04.5 %   MCV 87.7  78.0 - 100.0 fL   MCH 29.7  26.0 - 34.0 pg   MCHC 33.9  30.0 - 36.0 g/dL   RDW 40.9  81.1 - 91.4 %   Platelets 188  150 - 400 K/uL   Neutrophils Relative % 72  43 - 77 %   Neutro Abs 4.2  1.7 - 7.7 K/uL   Lymphocytes Relative 21  12 - 46 %   Lymphs Abs 1.2  0.7 - 4.0 K/uL   Monocytes Relative 7  3 - 12 %   Monocytes Absolute 0.4  0.1 - 1.0 K/uL   Eosinophils Relative 0  0 - 5 %   Eosinophils Absolute 0.0  0.0 - 0.7 K/uL   Basophils Relative 0  0 - 1 %   Basophils Absolute 0.0  0.0 - 0.1 K/uL  URINALYSIS, ROUTINE W REFLEX MICROSCOPIC      Result Value Ref Range   Color, Urine YELLOW  YELLOW   APPearance CLEAR  CLEAR   Specific Gravity, Urine 1.014  1.005 - 1.030   pH 5.5  5.0 - 8.0   Glucose, UA NEGATIVE  NEGATIVE mg/dL   Hgb urine dipstick NEGATIVE  NEGATIVE   Bilirubin Urine NEGATIVE  NEGATIVE   Ketones, ur NEGATIVE  NEGATIVE mg/dL   Protein, ur 30 (*) NEGATIVE mg/dL   Urobilinogen, UA 0.2  0.0 - 1.0 mg/dL   Nitrite NEGATIVE  NEGATIVE   Leukocytes, UA NEGATIVE  NEGATIVE  URINE MICROSCOPIC-ADD ON      Result Value Ref Range   Squamous Epithelial / LPF RARE  RARE   Bacteria, UA RARE  RARE   Urine-Other  RARE YEAST    POC OCCULT BLOOD, ED      Result Value Ref Range   Fecal Occult Bld NEGATIVE  NEGATIVE  CBG MONITORING, ED      Result Value Ref Range   Glucose-Capillary 20 (*) 70 - 99 mg/dL   Comment 1 Notify RN    CBG MONITORING, ED      Result Value Ref Range   Glucose-Capillary 159 (*) 70 - 99 mg/dL  CBG MONITORING, ED      Result Value Ref Range   Glucose-Capillary 162 (*) 70 - 99 mg/dL  CBG MONITORING, ED      Result Value Ref Range   Glucose-Capillary 183 (*) 70 - 99 mg/dL  CBG MONITORING, ED      Result Value Ref Range   Glucose-Capillary 190 (*) 70 - 99 mg/dL   Dg Chest 2 View  7/82/9562   CLINICAL DATA:  Stroke symptoms with history of hypertension and diabetes  EXAM: CHEST  2 VIEW  COMPARISON:  DG CHEST 2 VIEW dated 01/10/2014  FINDINGS: The lungs are adequately inflated and clear. The cardiopericardial silhouette is normal in size. The pulmonary vascularity is not engorged. The mediastinum is normal in width. There is no pleural effusion. The observed portions of the bony thorax appear normal.  IMPRESSION: There is no evidence of pneumonia nor CHF. Mild hyperinflation may be voluntary or could reflect underlying COPD.   Electronically Signed   By: Onalee Hua  [place]   On: 03/22/2014 00:54   Ct Head (brain) Wo Contrast  03/20/2014   CLINICAL DATA:  Left-sided weakness  EXAM: CT HEAD WITHOUT CONTRAST  TECHNIQUE: Contiguous axial images were obtained from the base of the skull through the vertex without intravenous contrast.  COMPARISON:  CT HEAD W/O CM dated 01/14/2014; MR HEAD W/O CM dated 01/09/2014  FINDINGS: There is mild diffuse cerebral and cerebellar atrophy with compensatory ventriculomegaly. These findings are stable. There is decreased density in the deep white matter of both cerebral hemispheres consistent with chronic small vessel ischemic type change. There are old lacunar infarctions in the basal ganglia bilaterally. There is no evidence of an acute intracranial hemorrhage.  There is no objective evidence of an acute evolving ischemic infarction. There are basal ganglia calcifications bilaterally which are stable. The cerebellum and brainstem are normal in density.  At bone window settings the observed portions of paranasal sinuses and mastoid air cells are clear. There is no evidence of an acute skull fracture.  IMPRESSION: 1. There is no evidence of an acute ischemic infarction. There are old lacunar infarctions in both cerebral hemispheres, and there is decreased density in the deep white matter of both cerebral hemispheres consistent with chronic small vessel ischemia. 2. There is no acute intracranial hemorrhage. 3. There are stable basal ganglia calcifications bilaterally. 4. These results were called by telephone at the time of interpretation on 03/20/2014 at 10:07 PM to Dr. Roseanne RenoStewart, who verbally acknowledged these results.   Electronically Signed   By: David  [place]   On: 03/20/2014 22:09   Mr Brain Wo Contrast  03/22/2014   CLINICAL DATA:  Evaluate stroke, hypoglycemia, intermittent slurred speech.  EXAM: MRI HEAD WITHOUT CONTRAST  MRA HEAD WITHOUT CONTRAST  TECHNIQUE: Multiplanar, multiecho pulse sequences of the brain and surrounding structures were obtained without intravenous contrast. Angiographic images of the head were obtained using MRA technique without contrast.  COMPARISON:  None.  MR HEAD W/O CM dated 01/09/2014; CT HEAD W/O CM dated 03/20/2014; CT HEAD W/O CM dated 01/14/2014  FINDINGS: MRI HEAD FINDINGS  Punctate focus of reduced diffusion within the left mesial temporal lobe, axial 14/60. Corresponding low ADC values. 7 mm focus of reduced diffusion within the left periventricular frontal white matter, axial 20/60 with corresponding low ADC values. 3 mm focus of reduced diffusion within the left superior cerebellum, with corresponding low ADC values.  Moderately motion degraded gradient sequence, with scattered subcentimeter foci of susceptibility artifact within  the supra and infratentorial brain, better seen on prior less motion degraded MRI. No convincing evidence of new micro hemorrhages. No midline shift or mass effect.  Moderate ventriculomegaly, likely on the basis of global parenchymal brain volume loss as there is overall commensurate enlargement cerebral sulci and cerebellar folia. Severe white matter changes, including pontine T2 hyperintensities are similar. Remote bilateral basal ganglia, thalamic and cerebellar infarcts. No midline shift or mass effect.  No abnormal extra-axial fluid collections. Status post bilateral ocular lens implants. Patient is edentulous. No abnormal sellar expansion. No cerebellar tonsillar ectopia.  MRA HEAD FINDINGS  Anterior circulation: Mildly motion degraded evaluation limits the sensitivity.  Anterior circulation: Normal flow related enhancement of the included cervical, petrous, cavernous internal carotid arteries. However, suspected at least 50% stenosis of the left anterior genu of the internal carotid artery corresponding to coarse calcification on prior head CT. Finding may be accentuated by patient motion. Normal flow related enhancement of the supra clinoid internal carotid arteries, the right is more robust.  Patent flow related enhancement of the anterior and middle cerebral arteries. At least 50% narrowing of the origin of a left M2 branch similar to worse.  Posterior circulation: Motion degrades evaluation. Thready flow of the left vertebral artery throughout the intradural segment, similar to prior examination. Basilar artery is patent, patent main branch vessels. Posterior communicating arteries are not distinctly identified. Focal high-grade stenosis of the right P2 segment, axial 97/160. Normal flow within the enhancement of the P3 segments.  No convincing evidence of aneurysm. No large vessel occlusion. Mild irregularity of the intracranial vessels diffusely most consistent with atherosclerosis.  IMPRESSION: MRI  head: Left mesial temporal lobe, left periventricular frontal white matter and left superior cerebellar subcentimeter foci of acute ischemia. These span multiple vascular territories, and may reflect thromboembolic disease without acute large territory infarct.  Moderately motion degraded examination. Scattered susceptibility artifact most consistent with sequelae of chronic hypertension, with multiple remote basal ganglia/thalamic and cerebellar infarcts. Severe white matter changes suggest chronic small vessel ischemic disease.  MRA head: Motion degraded examination. Multifocal intracranial mid to high-grade stenosis, including anterior genu of the left internal carotid artery, left M2, right P2 and left distal vertebral arteries, relatively unchanged for this motion degraded examination.  Diffuse mild intracranial luminal irregularity compatible with atherosclerosis.   Electronically Signed   By: Awilda Metro   On: 03/22/2014 01:04   Dg Chest Portable 1 View  04/07/2014   CLINICAL DATA:  Cough and congestion  EXAM: PORTABLE CHEST - 1 VIEW  COMPARISON:  03/21/2014  FINDINGS: Cardiac shadow is stable. The lungs are well aerated bilaterally. A small monitoring device is noted no bony abnormality is seen.  IMPRESSION: No acute abnormality is noted.   Electronically Signed   By: Alcide Clever M.D.   On: 04/07/2014 09:16   Mr Maxine Glenn Head/brain Wo Cm  03/22/2014   CLINICAL DATA:  Evaluate stroke, hypoglycemia, intermittent slurred speech.  EXAM: MRI HEAD WITHOUT CONTRAST  MRA HEAD WITHOUT CONTRAST  TECHNIQUE: Multiplanar, multiecho pulse sequences of the brain and surrounding structures were obtained without intravenous contrast. Angiographic images of the head were obtained using MRA technique without contrast.  COMPARISON:  None.  MR HEAD W/O CM dated 01/09/2014; CT HEAD W/O CM dated 03/20/2014; CT HEAD W/O CM dated 01/14/2014  FINDINGS: MRI HEAD FINDINGS  Punctate focus of reduced diffusion within the left mesial  temporal lobe, axial 14/60. Corresponding low ADC values. 7 mm focus of reduced diffusion within the left periventricular frontal white matter, axial 20/60 with corresponding low ADC values. 3 mm focus of reduced diffusion within the left superior cerebellum, with corresponding low ADC values.  Moderately motion degraded gradient sequence, with scattered subcentimeter foci of susceptibility artifact within the supra and infratentorial brain, better seen on prior less motion degraded MRI. No convincing evidence of new micro hemorrhages. No midline shift or mass effect.  Moderate ventriculomegaly, likely on the basis of global parenchymal brain volume loss as there is overall commensurate enlargement cerebral sulci and cerebellar folia. Severe white matter changes, including pontine T2 hyperintensities are similar. Remote bilateral basal ganglia, thalamic and cerebellar infarcts. No midline shift or mass effect.  No abnormal extra-axial fluid collections. Status post bilateral ocular lens implants. Patient is edentulous. No abnormal sellar expansion. No cerebellar tonsillar ectopia.  MRA HEAD FINDINGS  Anterior circulation: Mildly motion degraded evaluation limits the sensitivity.  Anterior circulation: Normal flow related enhancement of the included cervical, petrous, cavernous internal carotid arteries. However, suspected at least 50% stenosis of the  left anterior genu of the internal carotid artery corresponding to coarse calcification on prior head CT. Finding may be accentuated by patient motion. Normal flow related enhancement of the supra clinoid internal carotid arteries, the right is more robust. Patent flow related enhancement of the anterior and middle cerebral arteries. At least 50% narrowing of the origin of a left M2 branch similar to worse.  Posterior circulation: Motion degrades evaluation. Thready flow of the left vertebral artery throughout the intradural segment, similar to prior examination.  Basilar artery is patent, patent main branch vessels. Posterior communicating arteries are not distinctly identified. Focal high-grade stenosis of the right P2 segment, axial 97/160. Normal flow within the enhancement of the P3 segments.  No convincing evidence of aneurysm. No large vessel occlusion. Mild irregularity of the intracranial vessels diffusely most consistent with atherosclerosis.  IMPRESSION: MRI head: Left mesial temporal lobe, left periventricular frontal white matter and left superior cerebellar subcentimeter foci of acute ischemia. These span multiple vascular territories, and may reflect thromboembolic disease without acute large territory infarct.  Moderately motion degraded examination. Scattered susceptibility artifact most consistent with sequelae of chronic hypertension, with multiple remote basal ganglia/thalamic and cerebellar infarcts. Severe white matter changes suggest chronic small vessel ischemic disease.  MRA head: Motion degraded examination. Multifocal intracranial mid to high-grade stenosis, including anterior genu of the left internal carotid artery, left M2, right P2 and left distal vertebral arteries, relatively unchanged for this motion degraded examination.  Diffuse mild intracranial luminal irregularity compatible with atherosclerosis.   Electronically Signed   By: Awilda Metro   On: 03/22/2014 01:04    MDM  Patient noted to be hypoglycemic. Oral glucose and D50 administered intravenously at 10:50 AM patient alert Glasgow Coma Score 15 oriented to name date hospital feels well. She is eating a meal in bed Final diagnoses:  None   spoke with Dr. Renae Gloss plan stop insulin. Continue metformin. Patient to check insulin before each meal and at bedtime daily. Office call her to schedule visit and reevaluate diabetic medication regimen within one to 2 days Blood pressure recheck Diagnosis #1 hypoglycemia #2 hypertension #3chronic renal insufficiency    Doug Sou, MD 04/07/14 1335

## 2014-04-07 NOTE — ED Notes (Addendum)
Pt arrived by [facility] from home with husband. EMS was called out earlier this morning for blood sugar problems, pt husband gave her insulin without checking blood sugar. EMS got blood sugar up and pt stayed at home. They were dispatched to pt house again this morning because pt mental status was still altered, rolling around and picking at the air. CBG was 74 when EMS arrived the 2nd time. Pt was recently seen in ED for possible stroke. Pt Alert with some confusion. EMS stroke screen negative. Denies any pain.  BP-145/69 HR-80

## 2014-04-15 ENCOUNTER — Ambulatory Visit (INDEPENDENT_AMBULATORY_CARE_PROVIDER_SITE_OTHER): Payer: Commercial Managed Care - HMO | Admitting: *Deleted

## 2014-04-15 DIAGNOSIS — I6350 Cerebral infarction due to unspecified occlusion or stenosis of unspecified cerebral artery: Secondary | ICD-10-CM

## 2014-04-15 DIAGNOSIS — I639 Cerebral infarction, unspecified: Secondary | ICD-10-CM

## 2014-04-16 LAB — MDC_IDC_ENUM_SESS_TYPE_REMOTE
Date Time Interrogation Session: 20150611030517
MDC IDC SET ZONE DETECTION INTERVAL: 2000 ms
MDC IDC SET ZONE DETECTION INTERVAL: 380 ms
Zone Setting Detection Interval: 3000 ms

## 2014-04-22 ENCOUNTER — Ambulatory Visit: Payer: Self-pay | Admitting: Podiatry

## 2014-04-22 ENCOUNTER — Encounter: Payer: Self-pay | Admitting: Internal Medicine

## 2014-04-23 ENCOUNTER — Emergency Department (HOSPITAL_COMMUNITY): Payer: Medicare HMO

## 2014-04-23 ENCOUNTER — Encounter (HOSPITAL_COMMUNITY): Payer: Self-pay | Admitting: Emergency Medicine

## 2014-04-23 ENCOUNTER — Inpatient Hospital Stay (HOSPITAL_COMMUNITY)
Admission: EM | Admit: 2014-04-23 | Discharge: 2014-05-04 | DRG: 064 | Disposition: A | Discharge status: Skilled Nursing Facility | Payer: Medicare HMO | Attending: Internal Medicine | Admitting: Internal Medicine

## 2014-04-23 DIAGNOSIS — E1165 Type 2 diabetes mellitus with hyperglycemia: Secondary | ICD-10-CM | POA: Diagnosis present

## 2014-04-23 DIAGNOSIS — E860 Dehydration: Secondary | ICD-10-CM | POA: Diagnosis present

## 2014-04-23 DIAGNOSIS — N183 Chronic kidney disease, stage 3 unspecified: Secondary | ICD-10-CM | POA: Diagnosis present

## 2014-04-23 DIAGNOSIS — I129 Hypertensive chronic kidney disease with stage 1 through stage 4 chronic kidney disease, or unspecified chronic kidney disease: Secondary | ICD-10-CM | POA: Diagnosis present

## 2014-04-23 DIAGNOSIS — E785 Hyperlipidemia, unspecified: Secondary | ICD-10-CM

## 2014-04-23 DIAGNOSIS — F17200 Nicotine dependence, unspecified, uncomplicated: Secondary | ICD-10-CM | POA: Diagnosis present

## 2014-04-23 DIAGNOSIS — Z880 Allergy status to penicillin: Secondary | ICD-10-CM

## 2014-04-23 DIAGNOSIS — E1142 Type 2 diabetes mellitus with diabetic polyneuropathy: Secondary | ICD-10-CM | POA: Diagnosis present

## 2014-04-23 DIAGNOSIS — I10 Essential (primary) hypertension: Secondary | ICD-10-CM

## 2014-04-23 DIAGNOSIS — G9340 Encephalopathy, unspecified: Secondary | ICD-10-CM

## 2014-04-23 DIAGNOSIS — I739 Peripheral vascular disease, unspecified: Secondary | ICD-10-CM | POA: Diagnosis present

## 2014-04-23 DIAGNOSIS — E039 Hypothyroidism, unspecified: Secondary | ICD-10-CM | POA: Diagnosis present

## 2014-04-23 DIAGNOSIS — I639 Cerebral infarction, unspecified: Secondary | ICD-10-CM

## 2014-04-23 DIAGNOSIS — Z91199 Patient's noncompliance with other medical treatment and regimen due to unspecified reason: Secondary | ICD-10-CM

## 2014-04-23 DIAGNOSIS — I6350 Cerebral infarction due to unspecified occlusion or stenosis of unspecified cerebral artery: Principal | ICD-10-CM | POA: Diagnosis present

## 2014-04-23 DIAGNOSIS — E1129 Type 2 diabetes mellitus with other diabetic kidney complication: Secondary | ICD-10-CM | POA: Diagnosis present

## 2014-04-23 DIAGNOSIS — Z794 Long term (current) use of insulin: Secondary | ICD-10-CM

## 2014-04-23 DIAGNOSIS — Z7902 Long term (current) use of antithrombotics/antiplatelets: Secondary | ICD-10-CM

## 2014-04-23 DIAGNOSIS — E1149 Type 2 diabetes mellitus with other diabetic neurological complication: Secondary | ICD-10-CM | POA: Diagnosis present

## 2014-04-23 DIAGNOSIS — N058 Unspecified nephritic syndrome with other morphologic changes: Secondary | ICD-10-CM | POA: Diagnosis present

## 2014-04-23 DIAGNOSIS — G9341 Metabolic encephalopathy: Secondary | ICD-10-CM | POA: Diagnosis present

## 2014-04-23 DIAGNOSIS — N189 Chronic kidney disease, unspecified: Secondary | ICD-10-CM

## 2014-04-23 DIAGNOSIS — F39 Unspecified mood [affective] disorder: Secondary | ICD-10-CM

## 2014-04-23 DIAGNOSIS — R410 Disorientation, unspecified: Secondary | ICD-10-CM

## 2014-04-23 DIAGNOSIS — Z91041 Radiographic dye allergy status: Secondary | ICD-10-CM

## 2014-04-23 DIAGNOSIS — Z888 Allergy status to other drugs, medicaments and biological substances status: Secondary | ICD-10-CM

## 2014-04-23 DIAGNOSIS — T502X5A Adverse effect of carbonic-anhydrase inhibitors, benzothiadiazides and other diuretics, initial encounter: Secondary | ICD-10-CM | POA: Diagnosis present

## 2014-04-23 DIAGNOSIS — Z8673 Personal history of transient ischemic attack (TIA), and cerebral infarction without residual deficits: Secondary | ICD-10-CM

## 2014-04-23 DIAGNOSIS — Z9119 Patient's noncompliance with other medical treatment and regimen: Secondary | ICD-10-CM

## 2014-04-23 DIAGNOSIS — IMO0002 Reserved for concepts with insufficient information to code with codable children: Secondary | ICD-10-CM

## 2014-04-23 DIAGNOSIS — M949 Disorder of cartilage, unspecified: Secondary | ICD-10-CM

## 2014-04-23 DIAGNOSIS — N179 Acute kidney failure, unspecified: Secondary | ICD-10-CM | POA: Diagnosis present

## 2014-04-23 DIAGNOSIS — M899 Disorder of bone, unspecified: Secondary | ICD-10-CM | POA: Diagnosis present

## 2014-04-23 LAB — I-STAT CHEM 8, ED
BUN: 36 mg/dL — AB (ref 6–23)
CHLORIDE: 105 meq/L (ref 96–112)
Calcium, Ion: 1.11 mmol/L — ABNORMAL LOW (ref 1.13–1.30)
Creatinine, Ser: 2.4 mg/dL — ABNORMAL HIGH (ref 0.50–1.10)
Glucose, Bld: 224 mg/dL — ABNORMAL HIGH (ref 70–99)
HEMATOCRIT: 40 % (ref 36.0–46.0)
Hemoglobin: 13.6 g/dL (ref 12.0–15.0)
POTASSIUM: 4.2 meq/L (ref 3.7–5.3)
Sodium: 143 mEq/L (ref 137–147)
TCO2: 28 mmol/L (ref 0–100)

## 2014-04-23 LAB — CBC WITH DIFFERENTIAL/PLATELET
BASOS ABS: 0 10*3/uL (ref 0.0–0.1)
BASOS PCT: 0 % (ref 0–1)
Eosinophils Absolute: 0.1 10*3/uL (ref 0.0–0.7)
Eosinophils Relative: 1 % (ref 0–5)
HEMATOCRIT: 37 % (ref 36.0–46.0)
HEMOGLOBIN: 12.7 g/dL (ref 12.0–15.0)
Lymphocytes Relative: 29 % (ref 12–46)
Lymphs Abs: 2.3 10*3/uL (ref 0.7–4.0)
MCH: 30 pg (ref 26.0–34.0)
MCHC: 34.3 g/dL (ref 30.0–36.0)
MCV: 87.3 fL (ref 78.0–100.0)
MONO ABS: 0.7 10*3/uL (ref 0.1–1.0)
Monocytes Relative: 9 % (ref 3–12)
NEUTROS ABS: 4.9 10*3/uL (ref 1.7–7.7)
Neutrophils Relative %: 61 % (ref 43–77)
Platelets: 251 10*3/uL (ref 150–400)
RBC: 4.24 MIL/uL (ref 3.87–5.11)
RDW: 12.5 % (ref 11.5–15.5)
WBC: 8 10*3/uL (ref 4.0–10.5)

## 2014-04-23 LAB — BASIC METABOLIC PANEL
BUN: 31 mg/dL — ABNORMAL HIGH (ref 6–23)
CHLORIDE: 100 meq/L (ref 96–112)
CO2: 25 meq/L (ref 19–32)
Calcium: 9.6 mg/dL (ref 8.4–10.5)
Creatinine, Ser: 2.37 mg/dL — ABNORMAL HIGH (ref 0.50–1.10)
GFR calc Af Amer: 22 mL/min — ABNORMAL LOW (ref >90)
GFR calc non Af Amer: 19 mL/min — ABNORMAL LOW (ref >90)
GLUCOSE: 233 mg/dL — AB (ref 70–99)
POTASSIUM: 4.5 meq/L (ref 3.7–5.3)
SODIUM: 143 meq/L (ref 137–147)

## 2014-04-23 LAB — CBG MONITORING, ED: Glucose-Capillary: 212 mg/dL — ABNORMAL HIGH (ref 70–99)

## 2014-04-23 NOTE — ED Notes (Signed)
Pt transported to Xray. 

## 2014-04-23 NOTE — ED Notes (Signed)
Pt back from X-ray.  

## 2014-04-23 NOTE — ED Notes (Signed)
CBG result = 212.  Nurse, Elliot GurneyWoody, informed of hyperglycemia.

## 2014-04-23 NOTE — ED Notes (Addendum)
Patient is usually CAOx3, but patient is hyperglycemic and lethargic.  EMS states that the patient keeps dosing off while speaking to them.  Patient usually very talkative and not usually this lethargic.  Patient does answer questions appropriately.  Patient has not been taking her insulin due to being out of syringes.

## 2014-04-24 ENCOUNTER — Encounter (HOSPITAL_COMMUNITY): Payer: Self-pay | Admitting: Internal Medicine

## 2014-04-24 ENCOUNTER — Emergency Department (HOSPITAL_COMMUNITY): Payer: Medicare HMO

## 2014-04-24 ENCOUNTER — Inpatient Hospital Stay (HOSPITAL_COMMUNITY): Payer: Medicare HMO

## 2014-04-24 DIAGNOSIS — G9341 Metabolic encephalopathy: Secondary | ICD-10-CM | POA: Diagnosis present

## 2014-04-24 DIAGNOSIS — Z8673 Personal history of transient ischemic attack (TIA), and cerebral infarction without residual deficits: Secondary | ICD-10-CM

## 2014-04-24 DIAGNOSIS — N183 Chronic kidney disease, stage 3 unspecified: Secondary | ICD-10-CM

## 2014-04-24 DIAGNOSIS — Z91199 Patient's noncompliance with other medical treatment and regimen due to unspecified reason: Secondary | ICD-10-CM

## 2014-04-24 DIAGNOSIS — I739 Peripheral vascular disease, unspecified: Secondary | ICD-10-CM

## 2014-04-24 DIAGNOSIS — E860 Dehydration: Secondary | ICD-10-CM | POA: Diagnosis present

## 2014-04-24 DIAGNOSIS — N179 Acute kidney failure, unspecified: Secondary | ICD-10-CM | POA: Diagnosis present

## 2014-04-24 DIAGNOSIS — E1165 Type 2 diabetes mellitus with hyperglycemia: Secondary | ICD-10-CM

## 2014-04-24 DIAGNOSIS — Z9119 Patient's noncompliance with other medical treatment and regimen: Secondary | ICD-10-CM

## 2014-04-24 DIAGNOSIS — I10 Essential (primary) hypertension: Secondary | ICD-10-CM

## 2014-04-24 DIAGNOSIS — E785 Hyperlipidemia, unspecified: Secondary | ICD-10-CM

## 2014-04-24 DIAGNOSIS — N189 Chronic kidney disease, unspecified: Secondary | ICD-10-CM

## 2014-04-24 DIAGNOSIS — G9340 Encephalopathy, unspecified: Secondary | ICD-10-CM

## 2014-04-24 DIAGNOSIS — E1129 Type 2 diabetes mellitus with other diabetic kidney complication: Secondary | ICD-10-CM

## 2014-04-24 LAB — I-STAT VENOUS BLOOD GAS, ED
Acid-Base Excess: 4 mmol/L — ABNORMAL HIGH (ref 0.0–2.0)
BICARBONATE: 30.4 meq/L — AB (ref 20.0–24.0)
O2 Saturation: 41 %
PCO2 VEN: 52.7 mmHg — AB (ref 45.0–50.0)
PH VEN: 7.369 — AB (ref 7.250–7.300)
Patient temperature: 37
TCO2: 32 mmol/L (ref 0–100)
pO2, Ven: 25 mmHg — CL (ref 30.0–45.0)

## 2014-04-24 LAB — URINALYSIS, ROUTINE W REFLEX MICROSCOPIC
GLUCOSE, UA: NEGATIVE mg/dL
HGB URINE DIPSTICK: NEGATIVE
Ketones, ur: NEGATIVE mg/dL
Nitrite: NEGATIVE
PH: 5 (ref 5.0–8.0)
Protein, ur: 100 mg/dL — AB
SPECIFIC GRAVITY, URINE: 1.019 (ref 1.005–1.030)
Urobilinogen, UA: 1 mg/dL (ref 0.0–1.0)

## 2014-04-24 LAB — URINE MICROSCOPIC-ADD ON

## 2014-04-24 LAB — GLUCOSE, CAPILLARY
GLUCOSE-CAPILLARY: 108 mg/dL — AB (ref 70–99)
Glucose-Capillary: 189 mg/dL — ABNORMAL HIGH (ref 70–99)
Glucose-Capillary: 108 mg/dL — ABNORMAL HIGH (ref 70–99)
Glucose-Capillary: 149 mg/dL — ABNORMAL HIGH (ref 70–99)

## 2014-04-24 LAB — MRSA PCR SCREENING: MRSA by PCR: NEGATIVE

## 2014-04-24 LAB — TROPONIN I
Troponin I: <0.3 ng/mL (ref <0.30)
Troponin I: <0.3 ng/mL (ref <0.30)

## 2014-04-24 LAB — HEMOGLOBIN A1C
Hgb A1c MFr Bld: 8.9 % — ABNORMAL HIGH (ref <5.7)
Mean Plasma Glucose: 209 mg/dL — ABNORMAL HIGH (ref <117)

## 2014-04-24 MED ORDER — ONDANSETRON HCL 4 MG/2ML IJ SOLN: 4.0000 mg | Freq: Four times a day (QID) | INTRAMUSCULAR | Status: DC | PRN

## 2014-04-24 MED ORDER — SODIUM CHLORIDE 0.9 % IV SOLN: INTRAVENOUS | Status: DC

## 2014-04-24 MED ORDER — ACETAMINOPHEN 650 MG RE SUPP: 650.0000 mg | Freq: Four times a day (QID) | RECTAL | Status: DC | PRN

## 2014-04-24 MED ORDER — SODIUM CHLORIDE 0.9 % IJ SOLN
3.0000 mL | Freq: Two times a day (BID) | INTRAMUSCULAR | Status: DC
  Administered 2014-04-24 – 2014-05-04 (×9): 3 mL via INTRAVENOUS

## 2014-04-24 MED ORDER — LEVOTHYROXINE SODIUM 100 MCG IV SOLR
25.0000 ug | Freq: Every day | INTRAVENOUS | Status: DC
  Administered 2014-04-24 – 2014-04-27 (×4): 25 ug via INTRAVENOUS
  Filled 2014-04-24 (×4): qty 5

## 2014-04-24 MED ORDER — INSULIN ASPART 100 UNIT/ML ~~LOC~~ SOLN
0.0000 [IU] | SUBCUTANEOUS | Status: DC
  Administered 2014-04-24 – 2014-04-25 (×4): 2 [IU] via SUBCUTANEOUS
  Administered 2014-04-25: 5 [IU] via SUBCUTANEOUS
  Administered 2014-04-26: 2 [IU] via SUBCUTANEOUS
  Administered 2014-04-26: 3 [IU] via SUBCUTANEOUS
  Administered 2014-04-26 (×2): 2 [IU] via SUBCUTANEOUS

## 2014-04-24 MED ORDER — ATORVASTATIN CALCIUM 40 MG PO TABS
40.0000 mg | ORAL_TABLET | Freq: Every day | ORAL | Status: DC
  Filled 2014-04-24: qty 1

## 2014-04-24 MED ORDER — HYDRALAZINE HCL 20 MG/ML IJ SOLN
10.0000 mg | INTRAMUSCULAR | Status: DC | PRN
  Administered 2014-04-24 – 2014-04-25 (×2): 10 mg via INTRAVENOUS
  Filled 2014-04-24 (×3): qty 1

## 2014-04-24 MED ORDER — LATANOPROST 0.005 % OP SOLN
1.0000 [drp] | Freq: Every day | OPHTHALMIC | Status: DC
  Administered 2014-04-24 – 2014-05-03 (×8): 1 [drp] via OPHTHALMIC
  Filled 2014-04-24 (×3): qty 2.5

## 2014-04-24 MED ORDER — CHLORHEXIDINE GLUCONATE 0.12 % MT SOLN
15.0000 mL | Freq: Two times a day (BID) | OROMUCOSAL | Status: DC
  Administered 2014-04-24 – 2014-05-03 (×16): 15 mL via OROMUCOSAL
  Filled 2014-04-24 (×22): qty 15

## 2014-04-24 MED ORDER — DILTIAZEM HCL ER COATED BEADS 120 MG PO CP24: 120.0000 mg | ORAL_CAPSULE | Freq: Every day | ORAL | Status: DC

## 2014-04-24 MED ORDER — LEVOTHYROXINE SODIUM 50 MCG PO TABS
50.0000 ug | ORAL_TABLET | Freq: Every day | ORAL | Status: DC
  Filled 2014-04-24 (×2): qty 1

## 2014-04-24 MED ORDER — INSULIN GLARGINE 100 UNIT/ML ~~LOC~~ SOLN
5.0000 [IU] | Freq: Every day | SUBCUTANEOUS | Status: DC
  Administered 2014-04-24 – 2014-04-25 (×2): 5 [IU] via SUBCUTANEOUS
  Filled 2014-04-24 (×3): qty 0.05

## 2014-04-24 MED ORDER — SODIUM CHLORIDE 0.9 % IV SOLN
INTRAVENOUS | Status: DC
  Administered 2014-04-24: 09:00:00 via INTRAVENOUS

## 2014-04-24 MED ORDER — METOPROLOL SUCCINATE ER 25 MG PO TB24
25.0000 mg | ORAL_TABLET | Freq: Two times a day (BID) | ORAL | Status: DC
  Filled 2014-04-24 (×2): qty 1

## 2014-04-24 MED ORDER — CLOPIDOGREL BISULFATE 75 MG PO TABS
75.0000 mg | ORAL_TABLET | Freq: Every day | ORAL | Status: DC
  Filled 2014-04-24 (×2): qty 1

## 2014-04-24 MED ORDER — ACETAMINOPHEN 325 MG PO TABS
650.0000 mg | ORAL_TABLET | Freq: Four times a day (QID) | ORAL | Status: DC | PRN
  Filled 2014-04-24: qty 2

## 2014-04-24 MED ORDER — GABAPENTIN 300 MG PO CAPS
300.0000 mg | ORAL_CAPSULE | Freq: Two times a day (BID) | ORAL | Status: DC
  Filled 2014-04-24 (×2): qty 1

## 2014-04-24 MED ORDER — OLOPATADINE HCL 0.1 % OP SOLN
1.0000 [drp] | Freq: Two times a day (BID) | OPHTHALMIC | Status: DC
  Administered 2014-04-24 – 2014-05-04 (×19): 1 [drp] via OPHTHALMIC
  Filled 2014-04-24 (×3): qty 5

## 2014-04-24 MED ORDER — SODIUM CHLORIDE 0.9 % IV BOLUS (SEPSIS)
500.0000 mL | Freq: Once | INTRAVENOUS | Status: AC
  Administered 2014-04-24: 500 mL via INTRAVENOUS

## 2014-04-24 MED ORDER — BIOTENE DRY MOUTH MT LIQD
15.0000 mL | Freq: Two times a day (BID) | OROMUCOSAL | Status: DC
  Administered 2014-04-24 – 2014-05-03 (×16): 15 mL via OROMUCOSAL

## 2014-04-24 MED ORDER — DOCUSATE SODIUM 100 MG PO CAPS
100.0000 mg | ORAL_CAPSULE | Freq: Two times a day (BID) | ORAL | Status: DC
  Filled 2014-04-24 (×2): qty 1

## 2014-04-24 MED ORDER — HYDROCODONE-ACETAMINOPHEN 5-325 MG PO TABS: 1.0000 | ORAL_TABLET | ORAL | Status: DC | PRN

## 2014-04-24 MED ORDER — METOPROLOL TARTRATE 1 MG/ML IV SOLN
5.0000 mg | Freq: Four times a day (QID) | INTRAVENOUS | Status: DC
  Administered 2014-04-24 – 2014-04-25 (×5): 5 mg via INTRAVENOUS
  Filled 2014-04-24 (×9): qty 5

## 2014-04-24 MED ORDER — ONDANSETRON HCL 4 MG PO TABS: 4.0000 mg | ORAL_TABLET | Freq: Four times a day (QID) | ORAL | Status: DC | PRN

## 2014-04-24 MED ORDER — PANTOPRAZOLE SODIUM 40 MG PO TBEC: 40.0000 mg | DELAYED_RELEASE_TABLET | Freq: Every day | ORAL | Status: DC

## 2014-04-24 NOTE — Progress Notes (Signed)
CARE MANAGEMENT NOTE 04/24/2014  Patient:  [name] [name],[name] [name]   Account Number:  [id number]  Date Initiated:  04/24/2014  Documentation initiated by:  [facility]  Subjective/Objective Assessment:   Metabolic encephalopathy     Action/Plan:   lives at home with husband, [name]   Anticipated DC Date:     Anticipated DC Plan:  SKILLED NURSING FACILITY      DC Planning Services  CM consult      Choice offered to / List presented to:             Status of service:  In process, will continue to follow Medicare Important Message given?   (If response is "NO", the following Medicare IM given date fields will be blank) Date Medicare IM given:   Date Additional Medicare IM given:    Discharge Disposition:    Per UR Regulation:    If discussed at Long Length of Stay Meetings, dates discussed:    Comments:  04/24/2014 1215 NCM spoke to son, [name] #[phone]. Son states he is the POA, requested copy of paperwork for chart. States he and his brother assist with giving medications each day. They pick up her medications from the pharmacy. States the goal is to go back home but will consider SNF if pt not able to dc home. Son states he has never spoken to [name], #  [phone] CSW comes from [employer] Assistance Program with Social Services. Please contact son, [name] with any updates. [name] RN CCM Case Mgmt phone [phone]

## 2014-04-24 NOTE — ED Notes (Signed)
Attempted to ambulate pt.  Pt not moving.  Per husband, pt does ambulate at home.  Sts this is not the pt's norm.

## 2014-04-24 NOTE — ED Notes (Signed)
Report attempted 

## 2014-04-24 NOTE — Progress Notes (Signed)
Utilization review completed. Bertha Stanfill, RN, BSN. 

## 2014-04-24 NOTE — Progress Notes (Signed)
Pt admitted from 53M. Pt oriented to room. Vital signs shows elevated blood pressure of 203/70, all other vital signs WDL. Dr. Joseph ArtWoods notified and new orders received. Will continue to monitor.

## 2014-04-24 NOTE — ED Provider Notes (Signed)
CSN: [id number]     Arrival date & time 04/23/14  2238 History   First MD Initiated Contact with Patient 04/24/14 0106     Chief Complaint  Patient presents with  . Hyperglycemia  . Fatigue      Patient is a 73 y.o. female presenting with weakness. The history is provided by the patient.  Weakness This is a new problem. Episode onset: unknown. The problem occurs constantly. The problem has not changed since onset.Pertinent negatives include no chest pain, no abdominal pain, no headaches and no shortness of breath. Nothing aggravates the symptoms. Nothing relieves the symptoms. She has tried rest for the symptoms. The treatment provided no relief.  pt presents for "feeling tired and hungry" and "nodding off to sleep" frequently She thought it was related to hypoglycemia EMS was called but her glucose was actually elevated  Family at bedside reports pt appeared more drowsy at home and not completely at baseline Time of onset if not known Pt did not have a seizure No focal weakness reported/noted  Past Medical History  Diagnosis Date  . Hypertension   . Diabetes mellitus   . Chronic renal insufficiency   . Hyperlipidemia   . Osteopenia   . CVA (cerebral infarction)    Past Surgical History  Procedure Laterality Date  . Eye surgery    . Loop recorder implant  03-24-2014    MDT LinQ implanted by Dr Johney FrameAllred for cryptogenic stroke  . Tee without cardioversion N/A 03/23/2014    Procedure: TRANSESOPHAGEAL ECHOCARDIOGRAM (TEE);  Surgeon: Lars MassonKatarina H Nelson, MD;  Location: [facility] ENDOSCOPY;  Service: Cardiovascular;  Laterality: N/A;   Family History  Problem Relation Age of Onset  . Diabetes Mother   . Hypertension Mother    History  Substance Use Topics  . Smoking status: Current Every Day Smoker -- 0.50 packs/day for 60 years    Types: Cigarettes  . Smokeless tobacco: Not on file     Comment: pt states "sometimes I quit smoking for about 2 weeks then I start back"  . Alcohol Use:  Yes     Comment: occasionally   OB History   Grav Para Term Preterm Abortions TAB SAB Ect Mult Living                 Review of Systems  Constitutional: Negative for fever.  Respiratory: Negative for shortness of breath.   Cardiovascular: Negative for chest pain.  Gastrointestinal: Negative for vomiting and abdominal pain.  Neurological: Positive for weakness. Negative for seizures and headaches.  All other systems reviewed and are negative.     Allergies  Erythromycin; Iodine; and Penicillins  Home Medications   Prior to Admission medications   Medication Sig Start Date End Date Taking? Authorizing Katara Griner  atorvastatin (LIPITOR) 20 MG tablet Take 2 tablets (40 mg total) by mouth daily at 6 PM. 03/24/14   Shanker Levora DredgeM Ghimire, MD  bimatoprost (LUMIGAN) 0.01 % SOLN Place 1 drop into both eyes at bedtime. 03/24/14   Shanker Levora DredgeM Ghimire, MD  Cholecalciferol (VITAMIN D3) 2000 UNITS TABS Take 2,000 Units by mouth daily. 03/24/14   Shanker Levora DredgeM Ghimire, MD  clopidogrel (PLAVIX) 75 MG tablet Take 1 tablet (75 mg total) by mouth daily with breakfast. 03/24/14   Maretta BeesShanker M Ghimire, MD  diphenhydrAMINE (BENADRYL) 25 MG tablet Take 1 tablet (25 mg total) by mouth every 6 (six) hours as needed for allergies. 03/24/14   Shanker Levora DredgeM Ghimire, MD  furosemide (LASIX) 20 MG tablet Take 20  mg by mouth 2 (two) times daily.  04/03/14   Historical Lauralie Blacksher, MD  gabapentin (NEURONTIN) 300 MG capsule Take 1 capsule (300 mg total) by mouth 2 (two) times daily. 03/24/14   Shanker Levora DredgeM Ghimire, MD  glimepiride (AMARYL) 4 MG tablet Take 4 mg by mouth daily with breakfast.  04/03/14   Historical Jillyn Stacey, MD  hydrochlorothiazide (HYDRODIURIL) 25 MG tablet Take 1 tablet (25 mg total) by mouth daily. 03/24/14   Shanker Levora DredgeM Ghimire, MD  insulin NPH-regular Human (NOVOLIN 70/30) (70-30) 100 UNIT/ML injection Inject 15 Units into the skin 2 (two) times daily with a meal. 03/24/14   Shanker Levora DredgeM Ghimire, MD  levothyroxine (SYNTHROID,  LEVOTHROID) 50 MCG tablet Take 1 tablet (50 mcg total) by mouth daily. 03/24/14   Shanker Levora DredgeM Ghimire, MD  lisinopril (PRINIVIL,ZESTRIL) 20 MG tablet Take 1 tablet (20 mg total) by mouth daily. 03/24/14   Shanker Levora DredgeM Ghimire, MD  metFORMIN (GLUCOPHAGE) 1000 MG tablet Take 1 tablet (1,000 mg total) by mouth 2 (two) times daily with a meal. 03/24/14   Shanker Levora DredgeM Ghimire, MD  metoprolol succinate (TOPROL-XL) 25 MG 24 hr tablet Take 1 tablet (25 mg total) by mouth 2 (two) times daily. 03/24/14   Shanker Levora DredgeM Ghimire, MD  Olopatadine HCl (PATADAY) 0.2 % SOLN Place 1 drop into both eyes daily. 03/24/14   Shanker Levora DredgeM Ghimire, MD  omeprazole (PRILOSEC) 40 MG capsule Take 1 capsule (40 mg total) by mouth daily. 03/24/14   Shanker Levora DredgeM Ghimire, MD  potassium chloride SA (K-DUR,KLOR-CON) 20 MEQ tablet Take 1 tablet (20 mEq total) by mouth daily. 03/24/14   Shanker Levora DredgeM Ghimire, MD   BP 158/68  Pulse 76  Temp(Src) 98.3 F (36.8 C) (Axillary)  Resp 28  SpO2 96% Physical Exam CONSTITUTIONAL: Well developed/well nourished HEAD: Normocephalic/atraumatic EYES: EOMI/PERRL ENMT: Mucous membranes moist NECK: supple no meningeal signs SPINE:entire spine nontender CV: S1/S2 noted, no murmurs/rubs/gallops noted LUNGS: Lungs are clear to auscultation bilaterally, no apparent distress ABDOMEN: soft, nontender, no rebound or guarding NEURO: Pt is awake/alert, moves all extremitiesx4 No arm/leg drift is noted.  No facial droop is noted.  She answers questions appropriately EXTREMITIES: pulses normal, full ROM SKIN: warm, color normal PSYCH: no abnormalities of mood noted  ED Course  Procedures   3:26 AM Pt with very unclear history.  It is reported EMS was called due to "dozing off" and feeling hungry.  On my initial evaluation pt was awake/alert without focal neuro deficits.  Later Attempts to ambulate patient and she is unable to ambulate which is new for her.  She is now very drowsy and can not fully complete swallow study Will  need admission  tPA in stroke considered but not given due to:  Unclear time of onset,pt was improved in the ED on initial evaluation  4:41 AM Will admit to medical service She also has worsened renal failure, probable dehydration D/w dr Adela Glimpsedoutova, will admit to tele Labs Review Labs Reviewed  BASIC METABOLIC PANEL - Abnormal; Notable for the following:    Glucose, Bld 233 (*)    BUN 31 (*)    Creatinine, Ser 2.37 (*)    GFR calc non Af Amer 19 (*)    GFR calc Af Amer 22 (*)    All other components within normal limits  URINALYSIS, ROUTINE W REFLEX MICROSCOPIC - Abnormal; Notable for the following:    Color, Urine AMBER (*)    Bilirubin Urine SMALL (*)    Protein, ur 100 (*)  Leukocytes, UA SMALL (*)    All other components within normal limits  CBG MONITORING, ED - Abnormal; Notable for the following:    Glucose-Capillary 212 (*)    All other components within normal limits  I-STAT CHEM 8, ED - Abnormal; Notable for the following:    BUN 36 (*)    Creatinine, Ser 2.40 (*)    Glucose, Bld 224 (*)    Calcium, Ion 1.11 (*)    All other components within normal limits  CBC WITH DIFFERENTIAL  TROPONIN I  URINE MICROSCOPIC-ADD ON    Imaging Review Dg Chest 2 View  04/23/2014   CLINICAL DATA:  Fatigue and lethargy.  Hyperglycemia.  EXAM: CHEST  2 VIEW  COMPARISON:  04/07/2014.  02/05/2013.  FINDINGS: Cardiopericardial silhouette within normal limits. Mediastinal contours normal. Trachea midline. No airspace disease or effusion. Monitoring leads project over the chest. Mild left glenohumeral joint osteoarthritis. Question prior proximal left humerus fracture. Aortic arch atherosclerosis.  IMPRESSION: No active cardiopulmonary disease.   Electronically Signed   By: Andreas Newport M.D.   On: 04/23/2014 23:30   Ct Head Wo Contrast  04/24/2014   CLINICAL DATA:  weakness  EXAM: CT HEAD WITHOUT CONTRAST  TECHNIQUE: Contiguous axial images were obtained from the base of the skull  through the vertex without intravenous contrast.  COMPARISON:  Prior MRI from 03/21/2014  FINDINGS: Generalized cerebral atrophy with advanced chronic microvascular ischemic disease is present, similar to prior.  There is increased prominence of focal hypodensity in the region of the right caudate head/anterior limb of the right internal capsule (series 2, image 13). While this finding may be related to chronic small vessel ischemic changes, possible acute ischemia is not excluded. No acute intracranial hemorrhage.  No mass lesion or midline shift. Gray-white matter differentiation is well maintained. Ventricles are normal in size without evidence of hydrocephalus. CSF containing spaces are within normal limits. No extra-axial fluid collection.  The calvarium is intact.  Orbital soft tissues are within normal limits.  The paranasal sinuses and mastoid air cells are well pneumatized and free of fluid.  Scalp soft tissues are unremarkable.  IMPRESSION: 1. Age indeterminate focal hypodensity within the right caudate head/anterior limb of the right internal capsule. While this finding may be related to chronic small vessel ischemic changes in this region, possible superimposed acute ischemia is not excluded. Further evaluation with brain MRI could be performed if there is clinical concern for acute ischemic infarct in this region. 2. No intracranial hemorrhage. 3. Generalized cerebral atrophy with advanced chronic microvascular ischemic disease.   Electronically Signed   By: Rise Mu M.D.   On: 04/24/2014 03:03     EKG Interpretation   Date/Time:  Friday April 24 2014 00:09:48 EDT Ventricular Rate:  79 PR Interval:  142 QRS Duration: 80 QT Interval:  427 QTC Calculation: 489 R Axis:   27 Text Interpretation:  Sinus rhythm Borderline prolonged QT interval ST  elevation in anterior leads appears unchanged No significant change since  last tracing Confirmed by Bebe Shaggy  MD, Dorinda Hill ([id number]) on  04/24/2014  12:20:30 AM      MDM   Final diagnoses:  Acute on chronic renal failure  Encephalopathy  Dehydration    Nursing notes including past medical history and social history reviewed and considered in documentation Labs/vital reviewed and considered Previous records reviewed and considered     Joya Gaskins, MD 04/24/14 [phone]

## 2014-04-24 NOTE — Progress Notes (Signed)
RT attempted  ABG x2 and was unsuccessful. Pt refusing to hold still and clamps off arterial flow even with RT assistance. Admitting MD made aware.

## 2014-04-24 NOTE — H&P (Signed)
PCP:   Alva GarnetSHELTON,KIMBERLY R., MD    Chief Complaint:  Shaky and sweaty  HPI: [name] [name] is a 73 y.o. female   has a past medical history of Hypertension; Diabetes mellitus; Chronic renal insufficiency; Hyperlipidemia; Osteopenia; and CVA (cerebral infarction).   Presented with  Family states that patient was shaky and sweaty and then became somnolent. They were worried about her blood sugar given prior hx of hypoglycemia. She was noted to have BG >200. Patient was brought in to ER and was noted to be encephalopathic with increased somnolence. CT showed intermediate acuity CVA. Patient was admitted on 5/19 with CVA and hypoglycemia. Lab work was noted for increased cr from 1.8 to 2.6. Patient appeared hemoconcentrated With poor PO intake per family. ABG was attempted but patient was not cooperative.  Hospitalist was called for admission for encephalopathy and acute on chronic renal failure.   Review of Systems:   Unable to obtain given somnolence  Past Medical History: Past Medical History  Diagnosis Date  . Hypertension   . Diabetes mellitus   . Chronic renal insufficiency   . Hyperlipidemia   . Osteopenia   . CVA (cerebral infarction)    Past Surgical History  Procedure Laterality Date  . Eye surgery    . Loop recorder implant  03-24-2014    MDT LinQ implanted by Dr Johney FrameAllred for cryptogenic stroke  . Tee without cardioversion N/A 03/23/2014    Procedure: TRANSESOPHAGEAL ECHOCARDIOGRAM (TEE);  Surgeon: Lars MassonKatarina H Nelson, MD;  Location[facility] END[facility];  Service: Cardiovascular;  Laterality: N/A;     Medications: Prior to Admission medications   Medication Sig Start Date End Date Taking? Authorizing Lytle Malburg  atorvastatin (LIPITOR) 20 MG tablet Take 2 tablets (40 mg total) by mouth daily at 6 PM. 03/24/14   Shanker Levora DredgeM Ghimire, MD  bimatoprost (LUMIGAN) 0.01 % SOLN Place 1 drop into both eyes at bedtime. 03/24/14   Shanker Levora DredgeM Ghimire, MD  Cholecalciferol (VITAMIN D3) 2000 UNITS  TABS Take 2,000 Units by mouth daily. 03/24/14   Shanker Levora DredgeM Ghimire, MD  clopidogrel (PLAVIX) 75 MG tablet Take 1 tablet (75 mg total) by mouth daily with breakfast. 03/24/14   Maretta BeesShanker M Ghimire, MD  diphenhydrAMINE (BENADRYL) 25 MG tablet Take 1 tablet (25 mg total) by mouth every 6 (six) hours as needed for allergies. 03/24/14   Shanker Levora DredgeM Ghimire, MD  furosemide (LASIX) 20 MG tablet Take 20 mg by mouth 2 (two) times daily.  04/03/14   Historical Babe Anthis, MD  gabapentin (NEURONTIN) 300 MG capsule Take 1 capsule (300 mg total) by mouth 2 (two) times daily. 03/24/14   Shanker Levora DredgeM Ghimire, MD  glimepiride (AMARYL) 4 MG tablet Take 4 mg by mouth daily with breakfast.  04/03/14   Historical Kaylany Tesoriero, MD  hydrochlorothiazide (HYDRODIURIL) 25 MG tablet Take 1 tablet (25 mg total) by mouth daily. 03/24/14   Shanker Levora DredgeM Ghimire, MD  insulin NPH-regular Human (NOVOLIN 70/30) (70-30) 100 UNIT/ML injection Inject 15 Units into the skin 2 (two) times daily with a meal. 03/24/14   Shanker Levora DredgeM Ghimire, MD  levothyroxine (SYNTHROID, LEVOTHROID) 50 MCG tablet Take 1 tablet (50 mcg total) by mouth daily. 03/24/14   Shanker Levora DredgeM Ghimire, MD  lisinopril (PRINIVIL,ZESTRIL) 20 MG tablet Take 1 tablet (20 mg total) by mouth daily. 03/24/14   Shanker Levora DredgeM Ghimire, MD  metFORMIN (GLUCOPHAGE) 1000 MG tablet Take 1 tablet (1,000 mg total) by mouth 2 (two) times daily with a meal. 03/24/14   Shanker Levora DredgeM Ghimire,  MD  metoprolol succinate (TOPROL-XL) 25 MG 24 hr tablet Take 1 tablet (25 mg total) by mouth 2 (two) times daily. 03/24/14   Shanker Levora Dredge, MD  Olopatadine HCl (PATADAY) 0.2 % SOLN Place 1 drop into both eyes daily. 03/24/14   Shanker Levora Dredge, MD  omeprazole (PRILOSEC) 40 MG capsule Take 1 capsule (40 mg total) by mouth daily. 03/24/14   Shanker Levora Dredge, MD  potassium chloride SA (K-DUR,KLOR-CON) 20 MEQ tablet Take 1 tablet (20 mEq total) by mouth daily. 03/24/14   Shanker Levora Dredge, MD    Allergies:   Allergies  Allergen Reactions  .  Erythromycin Other (See Comments)    unknown  . Iodine     REACTION: hives  . Penicillins     REACTION: hives    Social History:  Lives at home With family    reports that she has been smoking Cigarettes.  She has a 30 pack-year smoking history. She does not have any smokeless tobacco history on file. She reports that she drinks alcohol. She reports that she does not use illicit drugs.    Family History: family history includes Diabetes in her mother; Hypertension in her mother.    Physical Exam: Patient Vitals for the past 24 hrs:  BP Temp Temp src Pulse Resp SpO2  04/24/14 0445 141/68 mmHg - - 77 29 95 %  04/24/14 0400 136/50 mmHg - - 75 27 97 %  04/24/14 0330 140/47 mmHg - - 83 33 85 %  04/24/14 0300 131/45 mmHg - - 78 22 95 %  04/24/14 0226 158/68 mmHg 98.3 F (36.8 C) Axillary 76 - 96 %  04/24/14 0200 154/59 mmHg - - 73 28 96 %  04/24/14 0130 139/67 mmHg - - 79 21 94 %  04/24/14 0100 137/54 mmHg - - 77 27 97 %  04/24/14 0030 144/60 mmHg - - 79 34 97 %  04/24/14 0016 - 97.8 F (36.6 C) Oral - - -  04/24/14 0000 146/57 mmHg - - 77 33 97 %  04/23/14 2353 145/60 mmHg - - 77 30 98 %  04/23/14 2330 159/53 mmHg - - 76 22 99 %  04/23/14 2300 141/57 mmHg - - 78 27 95 %    1. General:  in No Acute distress 2. Psychological: somnolent not answering questions not following comands 3. Head/ENT:    Dry Mucous Membranes                          Head Non traumatic, neck supple                          Normal  Dentition 4. SKIN: normal Skin turgor,  Skin clean Dry and intact no rash 5. Heart: Regular rate and rhythm no Murmur, Rub or gallop 6. Lungs: Clear to auscultation bilaterally, no wheezes or crackles   7. Abdomen: Soft, non-tender, Non distended, obese 8. Lower extremities: no clubbing, cyanosis, or edema 9. Neurologically appears to move all 4 extremities equally.  10. MSK: Normal range of motion  body mass index is unknown because there is no weight on  file.   Labs on Admission:   Recent Labs  04/23/14 2258 04/23/14 2305  NA 143 143  K 4.5 4.2  CL 100 105  CO2 25  --   GLUCOSE 233* 224*  BUN 31* 36*  CREATININE 2.37* 2.40*  CALCIUM 9.6  --  No results found for this basename: AST, ALT, ALKPHOS, BILITOT, PROT, ALBUMIN,  in the last 72 hours No results found for this basename: LIPASE, AMYLASE,  in the last 72 hours  Recent Labs  04/23/14 2258 04/23/14 2305  WBC 8.0  --   NEUTROABS 4.9  --   HGB 12.7 13.6  HCT 37.0 40.0  MCV 87.3  --   PLT 251  --     Recent Labs  04/23/14 2258  TROPONINI <0.30   No results found for this basename: TSH, T4TOTAL, FREET3, T3FREE, THYROIDAB,  in the last 72 hours No results found for this basename: VITAMINB12, FOLATE, FERRITIN, TIBC, IRON, RETICCTPCT,  in the last 72 hours Lab Results  Component Value Date   HGBA1C 10.1* 03/21/2014    The CrCl is unknown because both a height and weight (above a minimum accepted value) are required for this calculation. ABG    Component Value Date/Time   TCO2 28 04/23/2014 2305     Lab Results  Component Value Date   DDIMER  Value: <0.22        AT THE INHOUSE ESTABLISHED CUTOFF VALUE OF 0.48 ug/mL FEU, THIS ASSAY HAS BEEN DOCUMENTED IN THE LITERATURE TO HAVE A SENSITIVITY AND NEGATIVE PREDICTIVE VALUE OF AT LEAST 98 TO 99%.  THE TEST RESULT SHOULD BE CORRELATED WITH AN ASSESSMENT OF THE CLINICAL PROBABILITY OF DVT / VTE. 10/28/2010     Other results:  I have pearsonaly reviewed this: ECG REPORT  Rate: 79  Rhythm: NSR ST&T Change: no change from prior   UA SG 1.019 protein 100  BNP (last 3 results) No results found for this basename: PROBNP,  in the last 8760 hours  There were no vitals filed for this visit.  Cultures:    Component Value Date/Time   SDES URINE, CLEAN CATCH 03/20/2014 2236   SPECREQUEST NONE 03/20/2014 2236   CULT  Value: Multiple bacterial morphotypes present, none predominant. Suggest appropriate recollection if  clinically indicated. Performed at [facility] 03/20/2014 2236   REPTSTATUS 03/22/2014 FINAL 03/20/2014 2236   Radiological Exams on Admission: Dg Chest 2 View  04/23/2014   CLINICAL DATA:  Fatigue and lethargy.  Hyperglycemia.  EXAM: CHEST  2 VIEW  COMPARISON:  04/07/2014.  02/05/2013.  FINDINGS: Cardiopericardial silhouette within normal limits. Mediastinal contours normal. Trachea midline. No airspace disease or effusion. Monitoring leads project over the chest. Mild left glenohumeral joint osteoarthritis. Question prior proximal left humerus fracture. Aortic arch atherosclerosis.  IMPRESSION: No active cardiopulmonary disease.   Electronically Signed   By: Andreas NewportGeoffrey  Lamke M.D.   On: 04/23/2014 23:30   Ct Head Wo Contrast  04/24/2014   CLINICAL DATA:  weakness  EXAM: CT HEAD WITHOUT CONTRAST  TECHNIQUE: Contiguous axial images were obtained from the base of the skull through the vertex without intravenous contrast.  COMPARISON:  Prior MRI from 03/21/2014  FINDINGS: Generalized cerebral atrophy with advanced chronic microvascular ischemic disease is present, similar to prior.  There is increased prominence of focal hypodensity in the region of the right caudate head/anterior limb of the right internal capsule (series 2, image 13). While this finding may be related to chronic small vessel ischemic changes, possible acute ischemia is not excluded. No acute intracranial hemorrhage.  No mass lesion or midline shift. Gray-white matter differentiation is well maintained. Ventricles are normal in size without evidence of hydrocephalus. CSF containing spaces are within normal limits. No extra-axial fluid collection.  The calvarium is intact.  Orbital soft tissues  are within normal limits.  The paranasal sinuses and mastoid air cells are well pneumatized and free of fluid.  Scalp soft tissues are unremarkable.  IMPRESSION: 1. Age indeterminate focal hypodensity within the right caudate head/anterior limb of  the right internal capsule. While this finding may be related to chronic small vessel ischemic changes in this region, possible superimposed acute ischemia is not excluded. Further evaluation with brain MRI could be performed if there is clinical concern for acute ischemic infarct in this region. 2. No intracranial hemorrhage. 3. Generalized cerebral atrophy with advanced chronic microvascular ischemic disease.   Electronically Signed   By: Rise Mu M.D.   On: 04/24/2014 03:03    Chart has been reviewed  Assessment/Plan  73 yo F with recent admission for CVA presents with lethargy and evidence of dehydration  Present on Admission:  . Altered mental status likely due to encephalopathy possibly in the setting of acute on chronic failure vs. recurrent CVA hold lasix given gentle IVF and obtain repeat MRI/ MRA. Monitor in stepdown given severe unexplained lethargy.  . DM type 2, uncontrolled, with renal complications  - SSI hold PO meds, change to lantus 5 units while inpatient . Hypertension - continue home meds . CKD (chronic kidney disease), stage III acute on chronic renal failure obtain urine electrolytes . Dehydration - hold lasix, gentle IVF   Prophylaxis: SCD      CODE STATUS:  FULL CODE   Other plan as per orders.  I have spent a total of 55 min on this admission  DOUTOVA,ANASTASSIA 04/24/2014, 5:35 AM  [facility]  Pager [phone]   If 7AM-7PM, please contact the day team taking care of the patient  [email or web address]  Password [email or web address]

## 2014-04-24 NOTE — Evaluation (Signed)
Clinical/Bedside Swallow Evaluation Patient Details  Name: Erin FullingBetty J Cadden MRN: [id number] Date of Birth: [birth year]  Today's Date: 04/24/2014 Time: 0950-1000 SLP Time Calculation (min): 10 min  Past Medical History:  Past Medical History  Diagnosis Date  . Hypertension   . Diabetes mellitus   . Chronic renal insufficiency   . Hyperlipidemia   . Osteopenia   . CVA (cerebral infarction)    Past Surgical History:  Past Surgical History  Procedure Laterality Date  . Eye surgery    . Loop recorder implant  03-24-2014    MDT LinQ implanted by Dr Johney FrameAllred for cryptogenic stroke  . Tee without cardioversion N/A 03/23/2014    Procedure: TRANSESOPHAGEAL ECHOCARDIOGRAM (TEE);  Surgeon: Lars MassonKatarina H Nelson, MD;  [facility];  Service: Cardiovascular;  Laterality: N/A;   HPI:  Patient is a 73 year old female, admitted on 5/19 with CVA and hypoglycemia. CT shows age indeterminate focal hypodensity within the right caudate head/anterior limb of the right internal capsule. CT negative for pna. Pt has a history of CVA. Pt was seen by SLP last month (5/15), showed signs of aspriation with thin straw only, rec Dys 3/thin cup.     Assessment / Plan / Recommendation Clinical Impression  SLP provided max assist to prepare pt for POs and increase arousal. Even with repositioning, oral care and max verbal and tactile cues pt was not aware enough of PO trial (ice chip) to manage bolus. She is not able to consume POs until arousal improves. She will need to stay NPO and SLP will f/u tomorrow for PO readiness.     Aspiration Risk  Severe    Diet Recommendation NPO   Medication Administration: Via alternative means    Other  Recommendations Oral Care Recommendations: Oral care Q4 per protocol   Follow Up Recommendations       Frequency and Duration min 2x/week  2 weeks   Pertinent Vitals/Pain NA    SLP Swallow Goals     Swallow Study Prior Functional Status       General HPI:  Patient is a 73 year old female, admitted on 5/19 with CVA and hypoglycemia. CT shows age indeterminate focal hypodensity within the right caudate head/anterior limb of the right internal capsule. CT negative for pna. Pt has a history of CVA. Pt was seen by SLP last month (5/15), showed signs of aspriation with thin straw only, rec Dys 3/thin cup.   Type of Study: Bedside swallow evaluation Previous Swallow Assessment: BSE 5/16 Diet Prior to this Study: NPO Temperature Spikes Noted: No Respiratory Status: Room air History of Recent Intubation: No Behavior/Cognition: Lethargic Oral Cavity - Dentition: Edentulous;Dentures, not available Self-Feeding Abilities: Total assist Patient Positioning: Upright in bed Volitional Cough: Cognitively unable to elicit Volitional Swallow: Unable to elicit    Oral/Motor/Sensory Function Overall Oral Motor/Sensory Function: Other (comment) (doesnt follow commands)   Ice Chips Ice chips: Impaired Presentation: Spoon Oral Phase Impairments: Poor awareness of bolus;Impaired anterior to posterior transit;Reduced lingual movement/coordination;Reduced labial seal   Thin Liquid Thin Liquid: Not tested    Nectar Thick Nectar Thick Liquid: Not tested   Honey Thick Honey Thick Liquid: Not tested   Puree Puree: Not tested   Solid   GO    Solid: Not tested      Harlon DittyBonnie DeBlois, MA CCC-SLP [phone]  Claudine MoutonDeBlois, Bonnie Caroline 04/24/2014,10:08 AM

## 2014-04-24 NOTE — Progress Notes (Signed)
Received call from [employer] CSW with [employer] program.  Provided brief update and shared with her that family present at bedside reported patient has not had insulin for several days r/t lack of medication and/or syringes.  Contact Number for Andrey CampanileSandy is [phone].  Jacqulyn Canehristopher Scott McKeown RN, BSN, CCRN

## 2014-04-24 NOTE — ED Notes (Signed)
Pt unable to stay awake for stroke swallow screen.

## 2014-04-24 NOTE — ED Provider Notes (Signed)
Blood gas likely venous RA pulse ox 94% Pt resting but easily arousable Awaiting admission  Joya Gaskinsonald W Wickline, MD 04/24/14 [phone]

## 2014-04-24 NOTE — Progress Notes (Signed)
[facility]  KENNEDE LUSK ZOX:[id number] DOB: [birth year] DOA: 04/23/2014 PCP: Alva Garnet., MD  Time spent :  Brief narrative: 73 year old  BF PMHx HTN, HLD, diabetes type 2 with neuropathy, hypothyroidism  sent to the ER due to altered mentation. Family stated that patient was shaky and sweaty and then became somnolent. They were worried about her blood sugar given prior hx of hypoglycemia. She was noted to have BG >200.   Patient was brought in to ER and was noted to be encephalopathic with increased somnolence. CT showed age indeterminate CVA with MRI recommended. Patient was recently admitted on 5/19 with CVA and hypoglycemia. Lab work was c/w acute renal failure with elevation in cr from 1.8 to 2.6. Patient appeared hemoconcentrated with poor PO intake per family. ABG was attempted but patient was not cooperative.    HPI/Subjective: Confused somewhat but able to answer orientation questions with cues. Denied issues with poor oral intake but states is very thirsty  Assessment/Plan: Active Problems:   DM type 2, uncontrolled, with renal complications -not in DKA or HHNS but hyperglycemic -after admission bedside RN received message from social services reporting pt had not received insulin for several days apparently because she could not afford this medicine- also did not have syringes -last admit she was instructed to stop Amaryl but appears she did not -6/19 hemoglobin A1c= 8.9 -Lipid panel pending    Metabolic encephalopathy/ History of CVA  -MRI pending but on exam no focal deficits-MRI was delayed because patient has a loop recorder in place; cardiology notified so they can download the data-once this is complete cardiology endorses patient can undergo MRI -suspect ARF and DH as etiology -UA unremarkable -failed swallow eval at bedside so NPO for now -SLP eval confirms need for n.p.o. status at this time; will consider  reevaluation in the next 24-48 hour     Dehydration -multifactorial due to diuretics and hyperglycemia-at last dc was supposed to stop Lasix but appears as if she did not based on med rec -cont IVFs    Patient non adherence -may not be able to afford medications -CM consulted    Hypertension urgency -moderate control -Continue metoprolol IV 5 mg QID -Start Cardizem -Hydralazine IV 10 mgq 4hrs PRN SBP> 160 or DBP> 100     Acute on chronic renal failure/CKD (chronic kidney disease), stage III -suspect combo DH and offending meds (NSAIDs and diuretics,  ACE I) causative -pt had been instructed to stop meloxicam and naproxen at discharge last admit but med rec shows her to still be taking -follow lytes     Peripheral vascular disease, unspecified    Hypothyroidism -cont Synthroid    DVT prophylaxis: SCDs Code Status: Full Family Communication: Husband at bedside Disposition Plan/Expected LOS: Step down-concerned that both patient and husband at this time unable to adequately manage the patient's care at home financially-possibly can substitute less expensive alternative medications   Consultants: None   Procedures: 01/10/2014 echocardiogram - Left ventricle: mild LVH. LVEF; 60% to 65%.  6/19 CT head without contrast - Age indeterminate focal hypodensity within the right caudate head/anterior limb of the right internal capsule.  - No intracranial hemorrhage.     Culture 6/19 MRSA by PCR negative   Antibiotics: None  Objective: Blood pressure 169/69, pulse 63, temperature 98.2 F (36.8 C), temperature source Axillary, resp. rate 20, SpO2 97.00%.  Intake/Output Summary (Last 24 hours) at 04/24/14 1114 Last data filed at  04/24/14 1100  Gross per 24 hour  Intake 181.33 ml  Output      0 ml  Net 181.33 ml     Exam: General: A./O. x4, NAD, follows all commands  Lungs: Clear to auscultation bilaterally without wheezes or crackles  Cardiovascular: Regular  rhythm and rate, negative murmurs rubs or gallops, normal S1/S2   Abdomen: Nontender, nondistended, soft, bowel sounds positive, no rebound, no ascites, no appreciable mass  Extremities: No significant cyanosis, clubbing, or edema bilateral lower extremities  Neurologic; pupils equal round reactive to light and accommodation, tongue/uvula midline, muscle strength all extremities 5/5, sensation intact, did not ambulate patient.   Scheduled Meds:  Scheduled Meds: . antiseptic oral rinse  15 mL Mouth Rinse q12n4p  . chlorhexidine  15 mL Mouth Rinse BID  . insulin aspart  0-9 Units Subcutaneous 6 times per day  . insulin glargine  5 Units Subcutaneous QHS  . latanoprost  1 drop Both Eyes QHS  . levothyroxine  25 mcg Intravenous Daily  . metoprolol  5 mg Intravenous 4 times per day  . olopatadine  1 drop Both Eyes BID  . sodium chloride  3 mL Intravenous Q12H   Continuous Infusions: . sodium chloride 100 mL/hr at 04/24/14 0913    Data Reviewed: Basic Metabolic Panel:  Recent Labs Lab 04/23/14 2258 04/23/14 2305  NA 143 143  K 4.5 4.2  CL 100 105  CO2 25  --   GLUCOSE 233* 224*  BUN 31* 36*  CREATININE 2.37* 2.40*  CALCIUM 9.6  --    Liver Function Tests: No results found for this basename: AST, ALT, ALKPHOS, BILITOT, PROT, ALBUMIN,  in the last 168 hours No results found for this basename: LIPASE, AMYLASE,  in the last 168 hours No results found for this basename: AMMONIA,  in the last 168 hours CBC:  Recent Labs Lab 04/23/14 2258 04/23/14 2305  WBC 8.0  --   NEUTROABS 4.9  --   HGB 12.7 13.6  HCT 37.0 40.0  MCV 87.3  --   PLT 251  --    Cardiac Enzymes:  Recent Labs Lab 04/23/14 2258 04/24/14 0915  TROPONINI <0.30 <0.30   BNP (last 3 results) No results found for this basename: PROBNP,  in the last 8760 hours CBG:  Recent Labs Lab 04/23/14 2250 04/24/14 0814  GLUCAP 212* 189*    Recent Results (from the past 240 hour(s))  MRSA PCR SCREENING      Status: None   Collection Time    04/24/14  8:50 AM      Result Value Ref Range Status   MRSA by PCR NEGATIVE  NEGATIVE Final   Comment:            The GeneXpert MRSA Assay (FDA     approved for NASAL specimens     only), is one component of a     comprehensive MRSA colonization     surveillance program. It is not     intended to diagnose MRSA     infection nor to guide or     monitor treatment for     MRSA infections.     Studies:  Recent x-ray studies have been reviewed in detail by the Attending Physician       Junious SilkAllison Ellis, ANP [facility] Office  [phone] Pager [phone]   **If unable to reach the above provider after paging please contact the Flow Manager @ [phone]  On-Call/Text Page:  [email or web address]      password [email or web address]  If 7PM-7AM, please contact night-coverage [email or web address] Password [email or web address] 04/24/2014, 11:14 AM   LOS: 1 day   Examined patient with ANP Revonda StandardAllison, discussed assessment and plan and agree with the above plan Patient with complex multiple medical problems, > 35 minutes spent in direct patient care. Addressed all questions with husband and patient

## 2014-04-25 DIAGNOSIS — I639 Cerebral infarction, unspecified: Secondary | ICD-10-CM | POA: Diagnosis present

## 2014-04-25 DIAGNOSIS — I6350 Cerebral infarction due to unspecified occlusion or stenosis of unspecified cerebral artery: Principal | ICD-10-CM

## 2014-04-25 LAB — COMPREHENSIVE METABOLIC PANEL
ALT: 7 U/L (ref 0–35)
AST: 18 U/L (ref 0–37)
Albumin: 3.2 g/dL — ABNORMAL LOW (ref 3.5–5.2)
Alkaline Phosphatase: 52 U/L (ref 39–117)
BILIRUBIN TOTAL: 0.3 mg/dL (ref 0.3–1.2)
BUN: 26 mg/dL — AB (ref 6–23)
CALCIUM: 8.6 mg/dL (ref 8.4–10.5)
CO2: 23 mEq/L (ref 19–32)
CREATININE: 1.58 mg/dL — AB (ref 0.50–1.10)
Chloride: 105 mEq/L (ref 96–112)
GFR calc Af Amer: 36 mL/min — ABNORMAL LOW (ref >90)
GFR calc non Af Amer: 31 mL/min — ABNORMAL LOW (ref >90)
Glucose, Bld: 89 mg/dL (ref 70–99)
Potassium: 3.2 mEq/L — ABNORMAL LOW (ref 3.7–5.3)
Sodium: 146 mEq/L (ref 137–147)
Total Protein: 6.8 g/dL (ref 6.0–8.3)

## 2014-04-25 LAB — CBC
HCT: 33.9 % — ABNORMAL LOW (ref 36.0–46.0)
Hemoglobin: 11.5 g/dL — ABNORMAL LOW (ref 12.0–15.0)
MCH: 29.2 pg (ref 26.0–34.0)
MCHC: 33.9 g/dL (ref 30.0–36.0)
MCV: 86 fL (ref 78.0–100.0)
PLATELETS: 211 10*3/uL (ref 150–400)
RBC: 3.94 MIL/uL (ref 3.87–5.11)
RDW: 12.5 % (ref 11.5–15.5)
WBC: 7 10*3/uL (ref 4.0–10.5)

## 2014-04-25 LAB — MAGNESIUM: MAGNESIUM: 1.4 mg/dL — AB (ref 1.5–2.5)

## 2014-04-25 LAB — GLUCOSE, CAPILLARY
GLUCOSE-CAPILLARY: 151 mg/dL — AB (ref 70–99)
GLUCOSE-CAPILLARY: 48 mg/dL — AB (ref 70–99)
GLUCOSE-CAPILLARY: 76 mg/dL (ref 70–99)
GLUCOSE-CAPILLARY: 86 mg/dL (ref 70–99)
Glucose-Capillary: 122 mg/dL — ABNORMAL HIGH (ref 70–99)
Glucose-Capillary: 176 mg/dL — ABNORMAL HIGH (ref 70–99)
Glucose-Capillary: 251 mg/dL — ABNORMAL HIGH (ref 70–99)

## 2014-04-25 LAB — TSH: TSH: 0.326 u[IU]/mL — ABNORMAL LOW (ref 0.350–4.500)

## 2014-04-25 LAB — LIPID PANEL
Cholesterol: 129 mg/dL (ref 0–200)
HDL: 37 mg/dL — AB (ref >39)
LDL Cholesterol: 67 mg/dL (ref 0–99)
TRIGLYCERIDES: 127 mg/dL (ref <150)
Total CHOL/HDL Ratio: 3.5 RATIO
VLDL: 25 mg/dL (ref 0–40)

## 2014-04-25 LAB — PHOSPHORUS: Phosphorus: 3.2 mg/dL (ref 2.3–4.6)

## 2014-04-25 MED ORDER — METOPROLOL TARTRATE 1 MG/ML IV SOLN
7.5000 mg | Freq: Four times a day (QID) | INTRAVENOUS | Status: DC
  Administered 2014-04-25: 7.5 mg via INTRAVENOUS

## 2014-04-25 MED ORDER — DEXTROSE-NACL 5-0.45 % IV SOLN
INTRAVENOUS | Status: DC
  Administered 2014-04-25: 09:00:00 via INTRAVENOUS

## 2014-04-25 MED ORDER — DEXTROSE 50 % IV SOLN
1.0000 | Freq: Once | INTRAVENOUS | Status: AC
Start: 2014-04-25 — End: 2014-04-25
  Filled 2014-04-25: qty 50

## 2014-04-25 MED ORDER — DEXTROSE 50 % IV SOLN
INTRAVENOUS | Status: AC
  Administered 2014-04-25: 50 mL
  Filled 2014-04-25: qty 50

## 2014-04-25 MED ORDER — HYDRALAZINE HCL 20 MG/ML IJ SOLN
15.0000 mg | INTRAMUSCULAR | Status: DC | PRN
  Administered 2014-04-25 – 2014-04-27 (×4): 15 mg via INTRAVENOUS
  Filled 2014-04-25 (×4): qty 1

## 2014-04-25 MED ORDER — HYDRALAZINE HCL 20 MG/ML IJ SOLN: 15.0000 mg | INTRAMUSCULAR | Status: DC | PRN

## 2014-04-25 MED ORDER — METOPROLOL TARTRATE 1 MG/ML IV SOLN
10.0000 mg | Freq: Four times a day (QID) | INTRAVENOUS | Status: DC
  Administered 2014-04-25 – 2014-04-27 (×9): 10 mg via INTRAVENOUS
  Filled 2014-04-25 (×12): qty 10

## 2014-04-25 NOTE — Progress Notes (Addendum)
Physical Therapy Evaluation Patient Details Name: [name] [name] MRN: [id number] DOB: 11/05/1941 Today's Date: 04/25/2014   History of Present Illness  Patient is a 73 yo female admitted 04/23/14 with hyperglycemia and encephalopathy. Patient with CVA.  Patient with h/o HTN, DM with neuropathy, CRI, HLD, CVA in 03/24/14.  Clinical Impression  Patient presents with problems listed below.  Will benefit from acute PT to maximize independence prior to discharge.  Recommend ST-SNF for continued therapy at discharge prior to return home with husband.    Follow Up Recommendations SNF;Supervision/Assistance - 24 hour    Equipment Recommendations  None recommended by PT    Recommendations for Other Services       Precautions / Restrictions Precautions Precautions: Fall Restrictions Weight Bearing Restrictions: No      Mobility  Bed Mobility Overal bed mobility: Needs Assistance;+ 2 for safety/equipment Bed Mobility: Rolling;Sidelying to Sit Rolling: Min assist Sidelying to sit: Mod assist;+2 for safety/equipment       General bed mobility comments: Verbal cues for technique.  Assist to move LE's off of bed, and to raise trunk to sitting position.  Assist with sitting balance initially.  Transfers Overall transfer level: Needs assistance Equipment used: Rolling walker (2 wheeled) Transfers: Sit to/from Stand Sit to Stand: Mod assist;+2 safety/equipment         General transfer comment: Verbal cues for hand placement. Assist to rise to standing and for balance.  Ambulation/Gait Ambulation/Gait assistance: Mod assist;+2 safety/equipment Ambulation Distance (Feet): 15 Feet Assistive device: Rolling walker (2 wheeled) Gait Pattern/deviations: Step-through pattern;Decreased stride length;Shuffle;Trunk flexed Gait velocity: Decreased Gait velocity interpretation: Below normal speed for age/gender General Gait Details: Verbal cues for safe use of RW.  Physical assist to maneuver  RW, especially around obstacles.  Repeated cueing to remain on task.  Patient with flexed posture and unsteady gait.  Stairs            Wheelchair Mobility    Modified Rankin (Stroke Patients Only)       Balance                                             Pertinent Vitals/Pain     Home Living Family/patient expects to be discharged to:: Private residence Living Arrangements: Spouse/significant other Available Help at Discharge: Family;Available 24 hours/day (Husband and 2 sons) Type of Home: House Home Access: Ramped entrance     Home Layout: One level Home Equipment: Walker - 2 wheels;Shower seat;Electric scooter;Cane - single point;Bedside commode      Prior Function Level of Independence: Needs assistance   Gait / Transfers Assistance Needed: Assist to transfer and for ambulation with RW  ADL's / Homemaking Assistance Needed: Assist for bathing/dressing, housekeeping, and meal prep.         Hand Dominance   Dominant Hand: Right    Extremity/Trunk Assessment   Upper Extremity Assessment: Generalized weakness           Lower Extremity Assessment: Generalized weakness         Communication   Communication: No difficulties  Cognition Arousal/Alertness: Awake/alert Behavior During Therapy: Flat affect Overall Cognitive Status: Impaired/Different from baseline Area of Impairment: Orientation;Problem solving;Awareness;Memory Orientation Level: Disoriented to;Situation   Memory: Decreased short-term memory       Problem Solving: Slow processing;Decreased initiation;Requires verbal cues General Comments: Husband correcting information patient giving regarding PLOF.  General Comments      Exercises        Assessment/Plan    PT Assessment Patient needs continued PT services  PT Diagnosis Difficulty walking;Generalized weakness;Altered mental status   PT Problem List Decreased strength;Decreased activity  tolerance;Decreased balance;Decreased mobility;Decreased cognition;Decreased knowledge of use of DME  PT Treatment Interventions DME instruction;Gait training;Functional mobility training;Therapeutic exercise;Balance training;Cognitive remediation;Patient/family education   PT Goals (Current goals can be found in the Care Plan section) Acute Rehab PT Goals Patient Stated Goal: None stated PT Goal Formulation: With patient Time For Goal Achievement: 05/09/14 Potential to Achieve Goals: Good    Frequency Min 3X/week   Barriers to discharge        Co-evaluation               End of Session Equipment Utilized During Treatment: Gait belt Activity Tolerance: Patient limited by fatigue Patient left: in chair;with call bell/phone within reach Nurse Communication: Mobility status (In chair)         Time: 1610-9604: 1056-1113 PT Time Calculation (min): 17 min   Charges:   PT Evaluation $Initial PT Evaluation Tier I: 1 Procedure PT Treatments $Gait Training: 8-22 mins   PT G Codes:          Vena AustriaDavis, Susan H 04/25/2014, 5:20 PM Durenda HurtSusan H. Renaldo Fiddleravis, PT, [facility] Acute Rehab Services Pager [phone]

## 2014-04-25 NOTE — Progress Notes (Signed)
[place] TEAM 1 - Stepdown/ICU TEAM Progress Note  [name] [name] YNW:[id number] DOB: 1941-07-01 DOA: 04/23/2014 PCP: Alva Garnet., MD  Admit HPI / Brief Narrative: 73 year old  BF PMHx HTN, HLD, diabetes type 2 with neuropathy, hypothyroidism  sent to the ER due to altered mentation. Family stated that patient was shaky and sweaty and then became somnolent. They were worried about her blood sugar given prior hx of hypoglycemia. She was noted to have BG >200.   Patient was brought in to ER and was noted to be encephalopathic with increased somnolence. CT showed age indeterminate CVA with MRI recommended. Patient was recently admitted on 5/19 with CVA and hypoglycemia. Lab work was c/w acute renal failure with elevation in cr from 1.8 to 2.6. Patient appeared hemoconcentrated with poor PO intake per family. ABG was attempted but patient was not cooperative.   HPI/Subjective: 6/20 alert and oriented follows all commands, interact appropriately with staff. Request to know when her swallowing will be retested.   Assessment/Plan: Active Problems:   DM type 2, uncontrolled, with renal complications -not in DKA or HHNS but hyperglycemic -after admission bedside RN received message from social services reporting pt had not received insulin for several days apparently because she could not afford this medicine- also did not have syringes -last admit she was instructed to stop Amaryl but appears she did not -6/19 hemoglobin A1c= 8.9 -Lipid panel within ADA guidelines    Metabolic encephalopathy/ History of CVA/acute CVA  -MRI showing acute stroke see results below  -Secondary to ARF, DH, and acute CVA as etiology -UA unremarkable -failed swallow eval at bedside so NPO for now -SLP eval confirms need for n.p.o. status at this time; will order new swallow evaluation for Monday -PT/OT eval for SNF vs CIR vs home health     Dehydration -multifactorial due to diuretics and hyperglycemia-at  last dc was supposed to stop Lasix but appears as if she did not based on med rec -cont D5W -0.45% sodium chloride at 132ml/hr    Patient non adherence -may not be able to afford medications -CM consulted    Hypertension urgency -Secondary to acute CVA  -Continue metoprolol IV 10 mg QID -Continue Hydralazine IV 15 mg 4hrs PRN SBP> 170 or DBP> 100    Acute on chronic renal failure/CKD (chronic kidney disease), stage III -suspect combo DH and offending meds (NSAIDs and diuretics,  ACE I)  -pt had been instructed to stop meloxicam and naproxen at discharge last admit but med rec shows her to still be taking --Continue hydration; 6/20 improved Cr 1.58  Peripheral vascular disease, unspecified    Hypothyroidism -cont Synthroid 25 mcg daily    DVT prophylaxis: SCDs Code Status: Full Family Communication: Husband at bedside Disposition Plan/Expected LOS: SNF vs CIR vs home health   Consultants: NA    Procedure/Significant Events: 6/18 CXR; No active cardiopulmonary disease 6/19 CT head without contrast; Age indeterminate focal hypodensity w/i Rt caudate head/anterior limb of the right internal capsule. No intracranial hemorrhage. 6/19 MRI/MRA brain without contrast; Acute infarct right anterior basal ganglia -Atrophy with moderate to advanced chronic microvascular ischemia.  -Numerous areas chronic micro hemorrhage in brain consistent with hypertension      Culture 6/19 MRSA by PCR negative   Antibiotics: NA   DVT prophylaxis: SCD   Devices NA   LINES / TUBES:  6/18 20ga left wrist    Continuous Infusions: . dextrose 5 % and 0.45% NaCl 100 mL/hr at 04/25/14 0857  Objective: VITAL SIGNS: Temp: 98.9 F (37.2 C) (06/20 1600) Temp src: Oral (06/20 1600) BP: 200/69 mmHg (06/20 1600) Pulse Rate: 79 (06/20 1600) SPO2; 97% on room air FIO2:   Intake/Output Summary (Last 24 hours) at 04/25/14 1846 Last data filed at 04/25/14 1200  Gross per 24 hour   Intake   1800 ml  Output   1325 ml  Net    475 ml     Exam: General: A./O. x4, NAD, No acute respiratory distress Lungs: Clear to auscultation bilaterally without wheezes or crackles Cardiovascular: Regular rate and rhythm without murmur gallop or rub normal S1 and S2 Abdomen: Obese, Nontender, nondistended, soft, bowel sounds positive, no rebound, no ascites, no appreciable mass Extremities: No significant cyanosis, clubbing, or edema bilateral lower extremities Neurologic; pupils equal round reactive to light and accommodation, cranial nerves II through XII intact, tongue/uvula midline, mild left hemiparesis 4/5 strength in upper/lower extremity, sensation intact throughout, patient able to ambulate with one-person assist.  Data Reviewed: Basic Metabolic Panel:  Recent Labs Lab 04/23/14 2258 04/23/14 2305 04/25/14 0315  NA 143 143 146  K 4.5 4.2 3.2*  CL 100 105 105  CO2 25  --  23  GLUCOSE 233* 224* 89  BUN 31* 36* 26*  CREATININE 2.37* 2.40* 1.58*  CALCIUM 9.6  --  8.6  MG  --   --  1.4*  PHOS  --   --  3.2   Liver Function Tests:  Recent Labs Lab 04/25/14 0315  AST 18  ALT 7  ALKPHOS 52  BILITOT 0.3  PROT 6.8  ALBUMIN 3.2*   No results found for this basename: LIPASE, AMYLASE,  in the last 168 hours No results found for this basename: AMMONIA,  in the last 168 hours CBC:  Recent Labs Lab 04/23/14 2258 04/23/14 2305 04/25/14 0315  WBC 8.0  --  7.0  NEUTROABS 4.9  --   --   HGB 12.7 13.6 11.5*  HCT 37.0 40.0 33.9*  MCV 87.3  --  86.0  PLT 251  --  211   Cardiac Enzymes:  Recent Labs Lab 04/23/14 2258 04/24/14 0915 04/24/14 1657 04/24/14 2249  TROPONINI <0.30 <0.30 <0.30 <0.30   BNP (last 3 results) No results found for this basename: PROBNP,  in the last 8760 hours CBG:  Recent Labs Lab 04/25/14 0111 04/25/14 0425 04/25/14 0806 04/25/14 1125 04/25/14 1547  GLUCAP 151* 86 76 122* 176*    Recent Results (from the past 240  hour(s))  MRSA PCR SCREENING     Status: None   Collection Time    04/24/14  8:50 AM      Result Value Ref Range Status   MRSA by PCR NEGATIVE  NEGATIVE Final   Comment:            The GeneXpert MRSA Assay (FDA     approved for NASAL specimens     only), is one component of a     comprehensive MRSA colonization     surveillance program. It is not     intended to diagnose MRSA     infection nor to guide or     monitor treatment for     MRSA infections.     Studies:  Recent x-ray studies have been reviewed in detail by the Attending Physician  Scheduled Meds:  Scheduled Meds: . antiseptic oral rinse  15 mL Mouth Rinse q12n4p  . chlorhexidine  15 mL Mouth Rinse BID  . insulin aspart  0-9 Units Subcutaneous 6 times per day  . insulin glargine  5 Units Subcutaneous QHS  . latanoprost  1 drop Both Eyes QHS  . levothyroxine  25 mcg Intravenous Daily  . metoprolol  10 mg Intravenous 4 times per day  . olopatadine  1 drop Both Eyes BID  . sodium chloride  3 mL Intravenous Q12H    Time spent on care of this patient: 40 mins   Drema DallasWOODS, CURTIS, J , MD   [facility] Office  [phone] Pager [phone]  On-Call/Text Page:      [email or web address]      password [email or web address]  If 7PM-7AM, please contact night-coverage [email or web address] Password [email or web address] 04/25/2014, 6:46 PM   LOS: 2 days

## 2014-04-25 NOTE — Progress Notes (Signed)
Speech Language Pathology Treatment: Dysphagia  Patient Details Name: [name] [name] MRN: [id number] DOB: Apr 25, 1941 Today's Date: 04/25/2014 Time: 1700-1730 SLP Time Calculation (min): 30 min  Assessment / Plan / Recommendation Clinical Impression  F/u from initial BSE to assess readiness to initiate PO diet.  Alert, but confused.  Baseline wet cough prior to PO's.   Oral phase dysphagia indicated by weakness and poor oral awareness.  Due to cognitive status PO trial of solids not administered,   Suspected pharyngeal dysphagia with soft signs of penetration vs. Aspiration with cup sips of thin water in succession.  Due to current cognitive status and s/s present recommend to initiate conservative diet of dysphagia 1 (puree) and nectar thick liquids with full supervision with all meals.  ST to continue in acute care for diet tolerance and possible advancement.  Completion of objective evaluation to be completed.    HPI HPI: Patient is a 73 year old female, admitted on 5/19 with CVA and hypoglycemia. CT shows age indeterminate focal hypodensity within the right caudate head/anterior limb of the right internal capsule. CT negative for pna. Pt has a history of CVA. Pt was seen by SLP last month (5/15), showed signs of aspriation with thin straw only, rec Dys 3/thin cup.        SLP Plan  Continue with current plan of care    Recommendations Diet recommendations: Dysphagia 1 (puree);Nectar-thick liquid Liquids provided via: Cup;No straw Medication Administration: Crushed with puree Supervision: Staff to assist with self feeding;Full supervision/cueing for compensatory strategies Compensations: Slow rate;Small sips/bites;Check for pocketing Postural Changes and/or Swallow Maneuvers: Seated upright 90 degrees;Upright 30-60 min after meal              General recommendations: Rehab consult Oral Care Recommendations: Oral care Q4 per protocol Follow up Recommendations:  (TBD ) Plan: Continue with  current plan of care    GO    [name] FowlerKaren Dankof MS, CCC-SLP [phone] [facility] Health SystemDANKOF,[name] 04/25/2014, 7:12 PM

## 2014-04-26 LAB — BASIC METABOLIC PANEL
BUN: 10 mg/dL (ref 6–23)
CALCIUM: 8.3 mg/dL — AB (ref 8.4–10.5)
CHLORIDE: 107 meq/L (ref 96–112)
CO2: 18 meq/L — AB (ref 19–32)
Creatinine, Ser: 1.08 mg/dL (ref 0.50–1.10)
GFR calc Af Amer: 57 mL/min — ABNORMAL LOW (ref >90)
GFR calc non Af Amer: 49 mL/min — ABNORMAL LOW (ref >90)
GLUCOSE: 187 mg/dL — AB (ref 70–99)
Potassium: 3.8 mEq/L (ref 3.7–5.3)
SODIUM: 142 meq/L (ref 137–147)

## 2014-04-26 LAB — GLUCOSE, CAPILLARY
GLUCOSE-CAPILLARY: 134 mg/dL — AB (ref 70–99)
GLUCOSE-CAPILLARY: 160 mg/dL — AB (ref 70–99)
GLUCOSE-CAPILLARY: 223 mg/dL — AB (ref 70–99)
Glucose-Capillary: 170 mg/dL — ABNORMAL HIGH (ref 70–99)
Glucose-Capillary: 185 mg/dL — ABNORMAL HIGH (ref 70–99)
Glucose-Capillary: 245 mg/dL — ABNORMAL HIGH (ref 70–99)

## 2014-04-26 LAB — RAPID URINE DRUG SCREEN, HOSP PERFORMED
Amphetamines: NOT DETECTED
BARBITURATES: NOT DETECTED
BENZODIAZEPINES: NOT DETECTED
COCAINE: NOT DETECTED
OPIATES: NOT DETECTED
TETRAHYDROCANNABINOL: NOT DETECTED

## 2014-04-26 LAB — CBC
HEMATOCRIT: 35.3 % — AB (ref 36.0–46.0)
Hemoglobin: 12.1 g/dL (ref 12.0–15.0)
MCH: 29.8 pg (ref 26.0–34.0)
MCHC: 34.3 g/dL (ref 30.0–36.0)
MCV: 86.9 fL (ref 78.0–100.0)
Platelets: 168 10*3/uL (ref 150–400)
RBC: 4.06 MIL/uL (ref 3.87–5.11)
RDW: 12.8 % (ref 11.5–15.5)
WBC: 5.5 10*3/uL (ref 4.0–10.5)

## 2014-04-26 LAB — MAGNESIUM: Magnesium: 1.4 mg/dL — ABNORMAL LOW (ref 1.5–2.5)

## 2014-04-26 MED ORDER — INSULIN GLARGINE 100 UNIT/ML ~~LOC~~ SOLN
8.0000 [IU] | Freq: Every day | SUBCUTANEOUS | Status: DC
  Administered 2014-04-26: 8 [IU] via SUBCUTANEOUS
  Filled 2014-04-26 (×2): qty 0.08

## 2014-04-26 MED ORDER — INSULIN ASPART 100 UNIT/ML ~~LOC~~ SOLN
0.0000 [IU] | Freq: Three times a day (TID) | SUBCUTANEOUS | Status: DC
  Administered 2014-04-27: 8 [IU] via SUBCUTANEOUS
  Administered 2014-04-27 – 2014-04-28 (×3): 5 [IU] via SUBCUTANEOUS

## 2014-04-26 MED ORDER — INSULIN ASPART 100 UNIT/ML ~~LOC~~ SOLN: 0.0000 [IU] | SUBCUTANEOUS | Status: DC

## 2014-04-26 NOTE — Progress Notes (Signed)
[facility] TEAM 1 - Stepdown/ICU TEAM Progress Note  [name] FullingBetty J [name] ZOX:[id number] DOB: 09-May-1941 DOA: 04/23/2014 PCP: Alva GarnetSHELTON,KIMBERLY R., MD  Admit HPI / Brief Narrative: 73 year old  BF PMHx HTN, HLD, diabetes type 2 with neuropathy, hypothyroidism  sent to the ER due to altered mentation. Family stated that patient was shaky and sweaty and then became somnolent. They were worried about her blood sugar given prior hx of hypoglycemia. She was noted to have BG >200.   Patient was brought in to ER and was noted to be encephalopathic with increased somnolence. CT showed age indeterminate CVA with MRI recommended. Patient was recently admitted on 5/19 with CVA and hypoglycemia. Lab work was c/w acute renal failure with elevation in cr from 1.8 to 2.6. Patient appeared hemoconcentrated with poor PO intake per family. ABG was attempted but patient was not cooperative.   HPI/Subjective: 6/20 A./O. x3 (does not know why she is here), patient having delirium vs delusions, seen animals biting her arm, rolls of yarn/rope  in the corner which needs untying. Follows all commands.     Assessment/Plan: Active Problems:   DM type 2, uncontrolled, with renal complications -not in DKA or HHNS but hyperglycemic -after admission bedside RN received message from social services reporting pt had not received insulin for several days apparently because she could not afford this medicine- also did not have syringes -last admit she was instructed to stop Amaryl but appears she did not -6/19 hemoglobin A1c= 8.9 -Lipid panel within ADA guidelines -Increase Lantus to 8 units QHS -Continue moderate SSI    Metabolic encephalopathy/ History of CVA/acute CVA  -MRI showing acute stroke see results below  -Secondary to ARF, DH, and acute CVA as etiology -UA unremarkable -failed initial swallow eval at bedside , however on 6/20 pass swallow eval.  -SLP plan ;Dysphagia 1 (puree);Nectar-thick liquid Liquids provided  via: Cup;No straw -PT; recommends SNF; supervision/assistance 24-hour -OT eval pending  -UDS pending  -If patient's cognition does not improve within the next 24-48 hours will need to consult psychiatry for competency evaluation     Dehydration -multifactorial due to diuretics and hyperglycemia-at last dc was supposed to stop Lasix but appears as if she did not based on med rec -Patient still unable to drink enough fluids to meet requirements;cont D5W -0.45% sodium chloride at 14300ml/hr    Patient non adherence -may not be able to afford medications -CM consulted; awaiting recommendations NOTE; patient currently does not appear to be competent, and unsure that husband has the mental capacity to make complex medical decisions.    Hypertension urgency -Secondary to acute CVA  -Continue metoprolol IV 10 mg QID -Continue Hydralazine IV 15 mg 4hrs PRN SBP> 170 or DBP> 100 -Allow permissive HTN    Acute on chronic renal failure/CKD (chronic kidney disease), stage III -suspect combo DH and offending meds (NSAIDs and diuretics,  ACE I)  -pt had been instructed to stop meloxicam and naproxen at discharge last admit but med rec shows her to still be taking --Continue hydration; 6/20 improved Cr 1.58  Peripheral vascular disease, unspecified    Hypothyroidism -cont Synthroid 25 mcg daily    DVT prophylaxis: SCDs Code Status: Full Family Communication: Husband at bedside Disposition Plan/Expected LOS: SNF vs CIR vs home health   Consultants: NA    Procedure/Significant Events: 6/18 CXR; No active cardiopulmonary disease 6/19 CT head without contrast; Age indeterminate focal hypodensity w/i Rt caudate head/anterior limb of the right internal capsule. No intracranial hemorrhage. 6/19  MRI/MRA brain without contrast; Acute infarct right anterior basal ganglia -Atrophy with moderate to advanced chronic microvascular ischemia.  -Numerous areas chronic micro hemorrhage in brain consistent  with hypertension      Culture 6/19 MRSA by PCR negative   Antibiotics: NA   DVT prophylaxis: SCD   Devices NA   LINES / TUBES:  6/18 20ga left wrist    Continuous Infusions: . dextrose 5 % and 0.45% NaCl 100 mL/hr at 04/25/14 0857    Objective: VITAL SIGNS: Temp: 99.3 F (37.4 C) (06/21 1543) Temp src: Oral (06/21 1543) BP: 167/63 mmHg (06/21 1149) Pulse Rate: 88 (06/21 1149) SPO2; 97% on room air FIO2:   Intake/Output Summary (Last 24 hours) at 04/26/14 1553 Last data filed at 04/26/14 0700  Gross per 24 hour  Intake   1900 ml  Output    525 ml  Net   1375 ml     Exam: General: A./O. x3 (does not know why she is here), patient having delirium vs delusions, seen animals biting her arm, rolls of yarn/rope  in the corner which needs untying, NAD, No acute respiratory distress Lungs: Clear to auscultation bilaterally without wheezes or crackles Cardiovascular: Regular rate and rhythm without murmur gallop or rub normal S1 and S2 Abdomen: Obese, Nontender, nondistended, soft, bowel sounds positive, no rebound, no ascites, no appreciable mass Extremities: No significant cyanosis, clubbing, or edema bilateral lower extremities Neurologic; pupils equal round reactive to light and accommodation, cranial nerves II through XII intact, tongue/uvula midline, mild left hemiparesis 4/5 strength in upper/lower extremity, sensation intact throughout, patient able to ambulate with one-person assist.  Data Reviewed: Basic Metabolic Panel:  Recent Labs Lab 04/23/14 2258 04/23/14 2305 04/25/14 0315  NA 143 143 146  K 4.5 4.2 3.2*  CL 100 105 105  CO2 25  --  23  GLUCOSE 233* 224* 89  BUN 31* 36* 26*  CREATININE 2.37* 2.40* 1.58*  CALCIUM 9.6  --  8.6  MG  --   --  1.4*  PHOS  --   --  3.2   Liver Function Tests:  Recent Labs Lab 04/25/14 0315  AST 18  ALT 7  ALKPHOS 52  BILITOT 0.3  PROT 6.8  ALBUMIN 3.2*   No results found for this basename:  LIPASE, AMYLASE,  in the last 168 hours No results found for this basename: AMMONIA,  in the last 168 hours CBC:  Recent Labs Lab 04/23/14 2258 04/23/14 2305 04/25/14 0315  WBC 8.0  --  7.0  NEUTROABS 4.9  --   --   HGB 12.7 13.6 11.5*  HCT 37.0 40.0 33.9*  MCV 87.3  --  86.0  PLT 251  --  211   Cardiac Enzymes:  Recent Labs Lab 04/23/14 2258 04/24/14 0915 04/24/14 1657 04/24/14 2249  TROPONINI <0.30 <0.30 <0.30 <0.30   BNP (last 3 results) No results found for this basename: PROBNP,  in the last 8760 hours CBG:  Recent Labs Lab 04/25/14 2024 04/25/14 2351 04/26/14 0351 04/26/14 0802 04/26/14 1225  GLUCAP 251* 160* 134* 223* 170*    Recent Results (from the past 240 hour(s))  MRSA PCR SCREENING     Status: None   Collection Time    04/24/14  8:50 AM      Result Value Ref Range Status   MRSA by PCR NEGATIVE  NEGATIVE Final   Comment:            The GeneXpert MRSA Assay (FDA  approved for NASAL specimens     only), is one component of a     comprehensive MRSA colonization     surveillance program. It is not     intended to diagnose MRSA     infection nor to guide or     monitor treatment for     MRSA infections.     Studies:  Recent x-ray studies have been reviewed in detail by the Attending Physician  Scheduled Meds:  Scheduled Meds: . antiseptic oral rinse  15 mL Mouth Rinse q12n4p  . chlorhexidine  15 mL Mouth Rinse BID  . insulin aspart  0-9 Units Subcutaneous 6 times per day  . insulin glargine  5 Units Subcutaneous QHS  . latanoprost  1 drop Both Eyes QHS  . levothyroxine  25 mcg Intravenous Daily  . metoprolol  10 mg Intravenous 4 times per day  . olopatadine  1 drop Both Eyes BID  . sodium chloride  3 mL Intravenous Q12H    Time spent on care of this patient: 40 mins   Drema DallasWOODS, CURTIS, J , MD   [facility] Office  [phone] Pager - [phone]  On-Call/Text Page:      [email or web address]      password [email or web address]  If 7PM-7AM,  please contact night-coverage [email or web address] Password [email or web address] 04/26/2014, 3:53 PM   LOS: 3 days

## 2014-04-27 LAB — CBC
HEMATOCRIT: 36.5 % (ref 36.0–46.0)
HEMOGLOBIN: 12.4 g/dL (ref 12.0–15.0)
MCH: 29.2 pg (ref 26.0–34.0)
MCHC: 34 g/dL (ref 30.0–36.0)
MCV: 85.9 fL (ref 78.0–100.0)
Platelets: 234 10*3/uL (ref 150–400)
RBC: 4.25 MIL/uL (ref 3.87–5.11)
RDW: 12.8 % (ref 11.5–15.5)
WBC: 6.2 10*3/uL (ref 4.0–10.5)

## 2014-04-27 LAB — BASIC METABOLIC PANEL
BUN: 8 mg/dL (ref 6–23)
CO2: 23 meq/L (ref 19–32)
CREATININE: 1.18 mg/dL — AB (ref 0.50–1.10)
Calcium: 8.6 mg/dL (ref 8.4–10.5)
Chloride: 106 mEq/L (ref 96–112)
GFR calc Af Amer: 51 mL/min — ABNORMAL LOW (ref >90)
GFR calc non Af Amer: 44 mL/min — ABNORMAL LOW (ref >90)
Glucose, Bld: 215 mg/dL — ABNORMAL HIGH (ref 70–99)
Potassium: 2.8 mEq/L — CL (ref 3.7–5.3)
SODIUM: 143 meq/L (ref 137–147)

## 2014-04-27 LAB — GLUCOSE, CAPILLARY
GLUCOSE-CAPILLARY: 231 mg/dL — AB (ref 70–99)
GLUCOSE-CAPILLARY: 258 mg/dL — AB (ref 70–99)
Glucose-Capillary: 204 mg/dL — ABNORMAL HIGH (ref 70–99)
Glucose-Capillary: 258 mg/dL — ABNORMAL HIGH (ref 70–99)

## 2014-04-27 LAB — MAGNESIUM: MAGNESIUM: 1.4 mg/dL — AB (ref 1.5–2.5)

## 2014-04-27 MED ORDER — INSULIN GLARGINE 100 UNIT/ML ~~LOC~~ SOLN
10.0000 [IU] | Freq: Every day | SUBCUTANEOUS | Status: DC
  Administered 2014-04-27: 10 [IU] via SUBCUTANEOUS
  Filled 2014-04-27 (×2): qty 0.1

## 2014-04-27 MED ORDER — LEVOTHYROXINE SODIUM 50 MCG PO TABS
50.0000 ug | ORAL_TABLET | Freq: Every day | ORAL | Status: DC
Start: 2014-04-28 — End: 2014-05-04
  Administered 2014-04-28 – 2014-05-04 (×7): 50 ug via ORAL
  Filled 2014-04-27 (×9): qty 1

## 2014-04-27 MED ORDER — CLOPIDOGREL BISULFATE 75 MG PO TABS
75.0000 mg | ORAL_TABLET | Freq: Every day | ORAL | Status: DC
  Administered 2014-04-28 – 2014-05-04 (×7): 75 mg via ORAL
  Filled 2014-04-27 (×8): qty 1

## 2014-04-27 MED ORDER — POTASSIUM CHLORIDE CRYS ER 20 MEQ PO TBCR: 20.0000 meq | EXTENDED_RELEASE_TABLET | Freq: Every day | ORAL | Status: DC

## 2014-04-27 MED ORDER — ATORVASTATIN CALCIUM 40 MG PO TABS
40.0000 mg | ORAL_TABLET | Freq: Every day | ORAL | Status: DC
  Administered 2014-04-27 – 2014-05-04 (×8): 40 mg via ORAL
  Filled 2014-04-27 (×8): qty 1

## 2014-04-27 MED ORDER — GLUCERNA SHAKE PO LIQD
237.0000 mL | Freq: Two times a day (BID) | ORAL | Status: DC
  Administered 2014-04-27 – 2014-05-04 (×10): 237 mL via ORAL

## 2014-04-27 MED ORDER — LISINOPRIL 20 MG PO TABS
20.0000 mg | ORAL_TABLET | Freq: Every day | ORAL | Status: DC
  Administered 2014-04-27 – 2014-04-30 (×4): 20 mg via ORAL
  Filled 2014-04-27 (×5): qty 1

## 2014-04-27 MED ORDER — METOPROLOL SUCCINATE ER 25 MG PO TB24
25.0000 mg | ORAL_TABLET | Freq: Two times a day (BID) | ORAL | Status: DC
  Administered 2014-04-27: 25 mg via ORAL
  Filled 2014-04-27 (×3): qty 1

## 2014-04-27 MED ORDER — POTASSIUM CHLORIDE CRYS ER 20 MEQ PO TBCR
20.0000 meq | EXTENDED_RELEASE_TABLET | Freq: Every day | ORAL | Status: DC
  Administered 2014-04-28 – 2014-05-02 (×4): 20 meq via ORAL
  Filled 2014-04-27 (×6): qty 1

## 2014-04-27 MED ORDER — FUROSEMIDE 40 MG PO TABS
40.0000 mg | ORAL_TABLET | Freq: Every day | ORAL | Status: DC
  Administered 2014-04-28 – 2014-05-04 (×7): 40 mg via ORAL
  Filled 2014-04-27 (×8): qty 1

## 2014-04-27 MED ORDER — INSULIN GLARGINE 100 UNIT/ML ~~LOC~~ SOLN
12.0000 [IU] | Freq: Every day | SUBCUTANEOUS | Status: DC
  Filled 2014-04-27: qty 0.12

## 2014-04-27 MED ORDER — POTASSIUM CHLORIDE 10 MEQ/100ML IV SOLN
10.0000 meq | INTRAVENOUS | Status: AC
  Administered 2014-04-27 (×4): 10 meq via INTRAVENOUS
  Filled 2014-04-27: qty 100

## 2014-04-27 MED ORDER — SODIUM CHLORIDE 0.9 % IV SOLN
INTRAVENOUS | Status: DC
  Administered 2014-04-27: 21:00:00 via INTRAVENOUS

## 2014-04-27 MED ORDER — POTASSIUM CHLORIDE CRYS ER 20 MEQ PO TBCR
40.0000 meq | EXTENDED_RELEASE_TABLET | Freq: Once | ORAL | Status: AC
  Administered 2014-04-27: 40 meq via ORAL
  Filled 2014-04-27: qty 2

## 2014-04-27 NOTE — Progress Notes (Signed)
Inpatient Diabetes Program Recommendations  AACE/ADA: New Consensus Statement on Inpatient Glycemic Control (2013)  Target Ranges:  Prepandial:   less than 140 mg/dL      Peak postprandial:   less than 180 mg/dL (1-2 hours)      Critically ill patients:  140 - 180 mg/dL     Results for Erin FullingVANS, Denia J (MRN [id number]) as of 04/27/2014 08:20  Ref. Range 04/26/2014 08:02 04/26/2014 12:25 04/26/2014 15:51 04/26/2014 22:03  Glucose-Capillary Latest Range: 70-99 mg/dL 409223 (H) 811170 (H) 914185 (H) 245 (H)    Results for Erin FullingVANS, Sae J (MRN [id number]) as of 04/27/2014 08:20  Ref. Range 04/27/2014 07:30  Glucose-Capillary Latest Range: 70-99 mg/dL 086231 (H)     Admitted with CVA.  History of DM2, HTN, CKD, CVA.  Home DM Meds:    70/30 insulin 15 units bidwc Metformin 1000 mg bid Amaryl 4 mg daily (per MD notes, patient was supposed to stop Amaryl after last admission but did not stop)    **Note Lantus dose increased to 8 units QHS last evening.  Patient still having elevated fasting glucose levels.  **Based on the amount of 70/30 insulin that patient should be taking at home, patient may likely need more basal insulin   MD- Please consider increasing Lantus to 15 units QHS (this dose would be 80% of the long-acting insulin portion of her total 70/30 home dose)    Will follow Ambrose FinlandJeannine Johnston Fishel RN, MSN, CDE Diabetes Coordinator Inpatient Diabetes Program Team Pager: [phone] (8a-10p)

## 2014-04-27 NOTE — Care Management (Signed)
04-27-14 @ 1028  Medicare IM signed by patient and signed copy left on chart. Lamar LaundryBrenda Kaye Koontz LakeGraves-Bigelow, RN,BSN [phone]

## 2014-04-27 NOTE — Progress Notes (Signed)
OT Cancellation Note  Patient Details Name: [name] FullingBetty J Pokorski MRN: [id number] DOB: August 28, 1941   Cancelled Treatment:    Reason Eval/Treat Not Completed: Medical issues which prohibited therapy (K at 2.8, awaiting MD order to replete per RN.) Will continue to follow.  Evern BioMayberry, Julie Lynn 04/27/2014, 9:12 AM

## 2014-04-27 NOTE — Progress Notes (Addendum)
PT Cancellation Note  Patient Details Name: [name] FullingBetty J [name] MRN: [id number] DOB: Dec 02, 1940   Cancelled Treatment:    Reason Eval/Treat Not Completed: Medical issues which prohibited therapy.  Patient with K+ of 2.8.  Will hold PT today and return tomorrow for PT session.   Vena AustriaDavis, Susan H 04/27/2014, 12:58 PM Durenda HurtSusan H. Renaldo Fiddleravis, PT, [facility] Hospital[facility]BA Acute Rehab Services Pager [phone]

## 2014-04-27 NOTE — Progress Notes (Signed)
Critical K of 2.8 paged to tom Call han call not returned passed level on to Day shift nurse, will continue to monitor

## 2014-04-27 NOTE — Progress Notes (Addendum)
[facility] TEAM 1 - Stepdown/ICU TEAM Progress Note  [name] [name] ZOX:[id number] DOB: [birth year] DOA: 04/23/2014 PCP: Alva GarnetSHELTON,KIMBERLY R., MD  Admit HPI / Brief Narrative: 73 year old  F w/ a Hx of HTN, HLD, diabetes type 2 with neuropathy, and hypothyroidism who was sent to the ER due to altered mentation. Family stated that patient was shaky and sweaty and then became somnolent. They were worried about her blood sugar given prior hx of hypoglycemia. She was noted to have CBG >200.   Patient was brought in to ER and was noted to be encephalopathic. CT showed age indeterminate CVA with MRI recommended. Patient was previously admitted on 5/19 with CVA and hypoglycemia. Lab work was c/w acute renal failure with elevation in cr from 1.8 to 2.6. Patient appeared hemoconcentrated with poor PO intake per family.   HPI/Subjective: Pt is awake and conversant.  She is eating w/o diffculty.  She denies any new complaints.  She has no questions.    Assessment/Plan:  DM type 2, uncontrolled, with renal complications -not in DKA or HONK but hyperglycemic -after admission bedside RN received message from social services reporting pt had not received insulin for several days apparently because she could not afford this medicine - also did not have syringes -last admit she was instructed to stop Amaryl but appears she did not -A1c 8.9 -Lipid panel within ADA guidelines -Increase Lantus to 12 units QHS -Continue moderate SSI  Acute infarct right anterior basal ganglia w/ prior hx of CVA -MRI showing acute stroke - see results below  -failed initial swallow eval at bedside , however on 6/20 passed swallow eval -PT recommends SNF; supervision/assistance 24-hour -OT eval pending  -underwent recent extensive CVA w/u, to include TEE and loop recorder - was started on Plavix at that time  -unclear to me at present in loop recorder has been interrogated while here - need to investigate -NSR on monitor -  resume Plavix for now as cleared for diet 6/20  Metabolic encephalopathy -Secondary to ARF, DH, and acute CVA as etiology -UA unremarkable -appears to be approaching her baseline MS   Dehydration -multifactorial due to diuretics and hyperglycemia - at last dc was supposed to stop Lasix but appears as if she did not based on med rec -now appears euvolemic   Patient non adherence -may not be able to afford medications -CM consulted; awaiting recommendations NOTE: patient currently does not appear to be competent, and unsure that husband has the mental capacity to make complex medical decisions.  Hypertension urgency -Secondary to acute CVA  -BP currently within reasonable control in setting of subacute CVA - begin to tighten control now and follow   Acute on chronic renal failure/CKD (chronic kidney disease), stage III -suspect combo DH and offending meds (NSAIDs and diuretics,  ACE I)  -pt had been instructed to stop meloxicam and naproxen at discharge last admit but med rec shows her to still be taking -crt appears to have reached her baseline - follow   Peripheral vascular disease, unspecified  Hypothyroidism -cont Synthroid 25 mcg daily  DVT prophylaxis: SCDs Code Status: Full Family Communication: Husband at bedside Disposition Plan/Expected LOS: SNF vs CIR vs home health  Consultants: NA  Procedure/Significant Events: 6/18 CXR; No active cardiopulmonary disease 6/19 CT head without contrast; Age indeterminate focal hypodensity w/i Rt caudate head/anterior limb of the right internal capsule. No intracranial hemorrhage. 6/19 MRI/MRA brain without contrast; Acute infarct right anterior basal ganglia -Atrophy with moderate to advanced chronic  microvascular ischemia.  -Numerous areas chronic micro hemorrhage in brain consistent with hypertension   Culture 6/19 MRSA by PCR negative  Antibiotics: NA  DVT prophylaxis: SCD  Devices NA  LINES / TUBES:  6/18 20ga  left wrist  Objective: VITAL SIGNS: Blood pressure 192/61, pulse 82, temperature 98.8 F (37.1 C), temperature source Oral, resp. rate 23, height 5\' 2"  (1.575 m), weight 74.3 kg (163 lb 12.8 oz), SpO2 98.00%.  Intake/Output Summary (Last 24 hours) at 04/27/14 1556 Last data filed at 04/27/14 1140  Gross per 24 hour  Intake   2500 ml  Output    625 ml  Net   1875 ml   Exam: General: No acute respiratory distress Lungs: Clear to auscultation bilaterally without wheezes or crackles Cardiovascular: Regular rate and rhythm without murmur gallop or rub normal S1 and S2 Abdomen: Obese, Nontender, nondistended, soft, bowel sounds positive, no rebound, no ascites, no appreciable mass Extremities: No significant cyanosis, clubbing, or edema bilateral lower extremities Neurologic; cranial nerves II through XII intact, tongue/uvula midline, mild left hemiparesis 4/5 strength in upper/lower extremity, sensation intact throughout  Data Reviewed: Basic Metabolic Panel:  Recent Labs Lab 04/23/14 2258 04/23/14 2305 04/25/14 0315 04/26/14 1633 04/27/14 0200  NA 143 143 146 142 143  K 4.5 4.2 3.2* 3.8 2.8*  CL 100 105 105 107 106  CO2 25  --  23 18* 23  GLUCOSE 233* 224* 89 187* 215*  BUN 31* 36* 26* 10 8  CREATININE 2.37* 2.40* 1.58* 1.08 1.18*  CALCIUM 9.6  --  8.6 8.3* 8.6  MG  --   --  1.4* 1.4* 1.4*  PHOS  --   --  3.2  --   --    Liver Function Tests:  Recent Labs Lab 04/25/14 0315  AST 18  ALT 7  ALKPHOS 52  BILITOT 0.3  PROT 6.8  ALBUMIN 3.2*   CBC:  Recent Labs Lab 04/23/14 2258 04/23/14 2305 04/25/14 0315 04/26/14 1633 04/27/14 0200  WBC 8.0  --  7.0 5.5 6.2  NEUTROABS 4.9  --   --   --   --   HGB 12.7 13.6 11.5* 12.1 12.4  HCT 37.0 40.0 33.9* 35.3* 36.5  MCV 87.3  --  86.0 86.9 85.9  PLT 251  --  211 168 234   Cardiac Enzymes:  Recent Labs Lab 04/23/14 2258 04/24/14 0915 04/24/14 1657 04/24/14 2249  TROPONINI <0.30 <0.30 <0.30 <0.30    CBG:  Recent Labs Lab 04/26/14 1551 04/26/14 2203 04/27/14 0730 04/27/14 1130 04/27/14 1518  GLUCAP 185* 245* 231* 258* 204*    Recent Results (from the past 240 hour(s))  MRSA PCR SCREENING     Status: None   Collection Time    04/24/14  8:50 AM      Result Value Ref Range Status   MRSA by PCR NEGATIVE  NEGATIVE Final   Comment:            The GeneXpert MRSA Assay (FDA     approved for NASAL specimens     only), is one component of a     comprehensive MRSA colonization     surveillance program. It is not     intended to diagnose MRSA     infection nor to guide or     monitor treatment for     MRSA infections.    Studies:  Recent x-ray studies have been reviewed in detail by the Attending Physician  Scheduled Meds:  Scheduled  Meds: . antiseptic oral rinse  15 mL Mouth Rinse q12n4p  . chlorhexidine  15 mL Mouth Rinse BID  . feeding supplement (GLUCERNA SHAKE)  237 mL Oral BID BM  . insulin aspart  0-15 Units Subcutaneous TID WC  . insulin glargine  8 Units Subcutaneous QHS  . latanoprost  1 drop Both Eyes QHS  . levothyroxine  25 mcg Intravenous Daily  . metoprolol  10 mg Intravenous 4 times per day  . olopatadine  1 drop Both Eyes BID  . sodium chloride  3 mL Intravenous Q12H    Time spent on care of this patient: 35 mins  Lonia Blood, MD [facility] For Consults/Admissions - Flow Manager - [phone] Office  [phone] Pager [phone]  On-Call/Text Page:      [email or web address]      password [facility]  04/27/2014, 3:56 PM   LOS: 4 days

## 2014-04-27 NOTE — Clinical Social Work Psychosocial (Signed)
Clinical Social Work Department BRIEF PSYCHOSOCIAL ASSESSMENT 04/27/2014  Patient:  [name] [name], [name] [name]     Account Number:  [id number]     Admit date:  04/23/2014  Clinical Social Worker:  Lovey Newcomer  Date/Time:  04/27/2014 03:40 PM  Referred by:  Physician  Date Referred:  04/27/2014 Referred for  SNF Placement   Other Referral:   Interview type:  Family Other interview type:   CSW attempted to assessment patient at bedside and speak with spouse, but patient unable to stay awake and spouse asked CSW to speak with patient's son [name] [name].    PSYCHOSOCIAL DATA Living Status:  HUSBAND Admitted from facility:   Level of care:   Primary support name:  [name] [name] Primary support relationship to patient:  SPOUSE Degree of support available:   Support is fair.    CURRENT CONCERNS Current Concerns  Post-Acute Placement   Other Concerns:    SOCIAL WORK ASSESSMENT / PLAN CSW met with patient and husband at bedside to complete assessment. Patient is arousable but falls asleep easily. Patient's husband appears to have some cognitive deficits of his own and per MD's notes does not appear to have the capacity to make decisions for patient. Patient's husband requests that CSW speak with [name] [name] (patient's son).    Patient's son [name] [name] states that he does want patient placed at SNF at discharge. He states that he doesn't believe that the patient's husband can take care of patient at home. CSW inquired about whether or not [name] [name] believes it is safe for patient's husband to be at home alone. Lesely states that the patient's husband does well with taking care of himself, but he is not able to provide the assistance needed to take care of patient. [name] [name] prefers [facility] SNF for patient as he has been through rehab at this facility and liked the facility. CSW explained SNF search/placement process and answered Lesley's questions. Patient's son [name] [name] expresses great concern for his  mother (patient) and wants patient to get what she needs to improve.   Assessment/plan status:  Psychosocial Support/Ongoing Assessment of Needs Other assessment/ plan:   Complete FL2, Fax, PASRR   Information/referral to community resources:   CSW contact information and SNF list given to son.    PATIENT'S/FAMILY'S RESPONSE TO PLAN OF CARE: Patient's son [name] [name] expects patient to be discharged to SNF. [name] [name] was pleasant and engaged in assessment. CSW will assist with DC.       Liz Beach MSW, [place], [place], [phone]

## 2014-04-27 NOTE — Progress Notes (Signed)
INITIAL NUTRITION ASSESSMENT  DOCUMENTATION CODES Per approved criteria  -Obesity Unspecified -Not Applicable   INTERVENTION: -Glucerna Shake po BID, each supplement provides 220 kcal and 10 grams of protein -RD will continue to follow for nutrition care plan.  NUTRITION DIAGNOSIS: Inadequate oral intake related to CVA as evidenced by reported intake less than estimated needs and suspected weight loss.   Goal: Pt to meet >/= 90% of their estimated nutrition needs   Monitor:  Wt, po intake, acceptance of supplements, nutrition-related labs  Reason for Assessment: MST  73 y.o. female  Admitting Dx: Acute CVA (cerebrovascular accident)  ASSESSMENT: 73 year old BF PMHx HTN, HLD, diabetes type 2 with neuropathy, hypothyroidism sent to the ER due to altered mentation. Family stated that patient was shaky and sweaty and then became somnolent. They were worried about her blood sugar given prior hx of hypoglycemia. She was noted to have BG >200. Patient was brought in to ER and was noted to be encephalopathic with increased somnolence. CT showed age indeterminate CVA with MRI recommended. Patient was recently admitted on 5/19 with CVA and hypoglycemia. Lab work was c/w acute renal failure with elevation in cr from 1.8 to 2.6. Patient appeared hemoconcentrated with poor PO intake per family.   -Pt was very lethargic and had difficulty answering RD questions. Pt reports that she has an appetite "sometimes." She says that she is unsure if she has recently lost any weight. Per chart, pt's family reported that pt has had poor po prior to admission. Pt has no signs of fat and muscle wasting. Per family member, suspects that pt has lost weight. Per chart history, pt's wt appears to be trending down.  Lab Values: K 2.8, Glucose 215, HbA1C 10.1  Height: Ht Readings from Last 1 Encounters:  04/27/14 5\' 2"  (1.575 m)    Weight: Wt Readings from Last 1 Encounters:  04/27/14 163 lb 12.8 oz (74.3 kg)     Ideal Body Weight: 50.1 kg  % Ideal Body Weight: 148%  Wt Readings from Last 10 Encounters:  04/27/14 163 lb 12.8 oz (74.3 kg)  03/23/14 178 lb 1.6 oz (80.786 kg)  03/23/14 178 lb 1.6 oz (80.786 kg)  03/23/14 178 lb 1.6 oz (80.786 kg)  01/10/14 193 lb 15.4 oz (87.98 kg)  05/13/13 194 lb (87.998 kg)    Usual Body Weight: unknown, 178 on 5/18  % Usual Body Weight: 92%  BMI:  Body mass index is 29.95 kg/(m^2).  Estimated Nutritional Needs: Kcal: 1850-2050 Protein: 95-105 g Fluid: 1.9-2.1 L/day  Skin: Intact  Diet Order: Dysphagia  EDUCATION NEEDS: -Education needs addressed   Intake/Output Summary (Last 24 hours) at 04/27/14 1005 Last data filed at 04/27/14 0800  Gross per 24 hour  Intake   2500 ml  Output    475 ml  Net   2025 ml    Last BM: none recorded   Labs:   Recent Labs Lab 04/25/14 0315 04/26/14 1633 04/27/14 0200  NA 146 142 143  K 3.2* 3.8 2.8*  CL 105 107 106  CO2 23 18* 23  BUN 26* 10 8  CREATININE 1.58* 1.08 1.18*  CALCIUM 8.6 8.3* 8.6  MG 1.4* 1.4* 1.4*  PHOS 3.2  --   --   GLUCOSE 89 187* 215*    CBG (last 3)   Recent Labs  04/26/14 1551 04/26/14 2203 04/27/14 0730  GLUCAP 185* 245* 231*    Scheduled Meds: . antiseptic oral rinse  15 mL Mouth Rinse q12n4p  .  chlorhexidine  15 mL Mouth Rinse BID  . insulin aspart  0-15 Units Subcutaneous TID WC  . insulin glargine  8 Units Subcutaneous QHS  . latanoprost  1 drop Both Eyes QHS  . levothyroxine  25 mcg Intravenous Daily  . metoprolol  10 mg Intravenous 4 times per day  . olopatadine  1 drop Both Eyes BID  . potassium chloride  10 mEq Intravenous Q1 Hr x 4  . sodium chloride  3 mL Intravenous Q12H    Continuous Infusions: . dextrose 5 % and 0.45% NaCl 100 mL/hr at 04/25/14 [id number]    Past Medical History  Diagnosis Date  . Hypertension   . Diabetes mellitus   . Chronic renal insufficiency   . Hyperlipidemia   . Osteopenia   . CVA (cerebral infarction)      Past Surgical History  Procedure Laterality Date  . Eye surgery    . Loop recorder implant  03-24-2014    MDT LinQ implanted by Dr Johney FrameAllred for cryptogenic stroke  . Tee without cardioversion N/A 03/23/2014    Procedure: TRANSESOPHAGEAL ECHOCARDIOGRAM (TEE);  Surgeon: Lars MassonKatarina H Nelson, MD;  [facility];  Service: Cardiovascular;  Laterality: N/A;    Ebbie LatusHaley Hawkins RD, LDN

## 2014-04-27 NOTE — Progress Notes (Signed)
Speech Language Pathology Treatment: Dysphagia  Patient Details Name: [name] [name] MRN: [id number] DOB: 07/31/1941 Today's Date: 04/27/2014 Time: [id number] SLP Time Calculation (min): 18 min  Assessment / Plan / Recommendation Clinical Impression  Pt demonstrates improved function today, no evidence of aspiration with thin liquids. Pt was able to masticate soft solids with extra time though her dentures are not present. She reports she often eats without her dentures at home. Recommend pt upgrade to thin liquids, still restricting straw use due to history of aspiration with straws only PTA. Pt may also have dys 2 solids. Will f/u for tolerance.    HPI HPI: Patient is a 73 year old female, admitted on 5/19 with CVA and hypoglycemia. CT shows age indeterminate focal hypodensity within the right caudate head/anterior limb of the right internal capsule. CT negative for pna. Pt has a history of CVA. Pt was seen by SLP last month (5/15), showed signs of aspriation with thin straw only, rec Dys 3/thin cup.     Pertinent Vitals NA  SLP Plan  Continue with current plan of care    Recommendations Diet recommendations: Dysphagia 2 (fine chop);Thin liquid Liquids provided via: Cup;Straw Medication Administration: Whole meds with puree Supervision: Staff to assist with self feeding;Full supervision/cueing for compensatory strategies Compensations: Slow rate;Small sips/bites;Check for pocketing Postural Changes and/or Swallow Maneuvers: Seated upright 90 degrees;Upright 30-60 min after meal              General recommendations: Rehab consult Oral Care Recommendations: Oral care BID Follow up Recommendations: Inpatient Rehab Plan: Continue with current plan of care    GO    [name] A [facility] LLCBonnie DeBlois, MA CCC-SLP [phone]  [name] [name], [name] [name] 04/27/2014, 9:04 AM

## 2014-04-27 NOTE — Clinical Social Work Note (Signed)
FL2 placed on patient's chart for MD signature.   Bryant Campbell MSW, LCSWA, LCASA, [phone] 

## 2014-04-27 NOTE — Clinical Social Work Placement (Addendum)
Clinical Social Work Department CLINICAL SOCIAL WORK PLACEMENT NOTE 04/27/2014  Patient:  [name] FullingVANS,Enola J  Account Number:  [id number] Admit date:  04/23/2014  Clinical Social Worker:  Cherre BlancJOSEPH BRYANT CAMPBELL, [place]  Date/time:  04/27/2014 04:00 PM  Clinical Social Work is seeking post-discharge placement for this patient at the following level of care:   SKILLED NURSING   (*CSW will update this form in Epic as items are completed)   04/27/2014  Patient/family provided with [facility] Department of Clinical Social Work's list of facilities offering this level of care within the geographic area requested by the patient (or if unable, by the patient's family).  04/27/2014  Patient/family informed of their freedom to choose among providers that offer the needed level of care, that participate in Medicare, Medicaid or managed care program needed by the patient, have an available bed and are willing to accept the patient.  04/27/2014  Patient/family informed of [facility]' ownership interest in [facility], as well as of the fact that they are under no obligation to receive care at this facility.  PASARR submitted to EDS on 04/27/2014 PASARR number received on 04/27/2014  FL2 transmitted to all facilities in geographic area requested by pt/family on  04/27/2014 FL2 transmitted to all facilities within larger geographic area on   Patient informed that his/her managed care company has contracts with or will negotiate with  certain facilities, including the following:     Patient/family informed of bed offers received:  05/04/14 [facility] recommends and patient chooses bed at    [facility]   Patient to be transferred to facility by PTAR Patient and family notified of transfer on  Name of family member notified:    The following physician request were entered in Epic:  Additional Comments: 04/30/14:  Per MD patient will discharge to SNF on Friday, 6/26 after being cleared by psych. Call made to [facility] and update provided.  Addendum: [facility]. Informed pt's son in telephone conversation. [facility] scheduled for 2:15pm. CSW signing off.  Roddie McBryant Campbell MSW, [place], [place], [phone]   Maryclare LabradorJulie Anderson, MSW, [facility] CenterCSWA Clinical Social Worker [phone]

## 2014-04-28 DIAGNOSIS — R404 Transient alteration of awareness: Secondary | ICD-10-CM

## 2014-04-28 LAB — GLUCOSE, CAPILLARY
GLUCOSE-CAPILLARY: 228 mg/dL — AB (ref 70–99)
GLUCOSE-CAPILLARY: 214 mg/dL — AB (ref 70–99)
Glucose-Capillary: 177 mg/dL — ABNORMAL HIGH (ref 70–99)
Glucose-Capillary: 204 mg/dL — ABNORMAL HIGH (ref 70–99)
Glucose-Capillary: 177 mg/dL — ABNORMAL HIGH (ref 70–99)

## 2014-04-28 LAB — CBC
HEMATOCRIT: 34.6 % — AB (ref 36.0–46.0)
Hemoglobin: 11.8 g/dL — ABNORMAL LOW (ref 12.0–15.0)
MCH: 29.4 pg (ref 26.0–34.0)
MCHC: 34.1 g/dL (ref 30.0–36.0)
MCV: 86.3 fL (ref 78.0–100.0)
PLATELETS: 223 10*3/uL (ref 150–400)
RBC: 4.01 MIL/uL (ref 3.87–5.11)
RDW: 12.9 % (ref 11.5–15.5)
WBC: 5.9 10*3/uL (ref 4.0–10.5)

## 2014-04-28 LAB — BASIC METABOLIC PANEL
BUN: 7 mg/dL (ref 6–23)
CHLORIDE: 108 meq/L (ref 96–112)
CO2: 21 meq/L (ref 19–32)
Calcium: 8.4 mg/dL (ref 8.4–10.5)
Creatinine, Ser: 1.13 mg/dL — ABNORMAL HIGH (ref 0.50–1.10)
GFR calc Af Amer: 54 mL/min — ABNORMAL LOW (ref >90)
GFR calc non Af Amer: 47 mL/min — ABNORMAL LOW (ref >90)
Glucose, Bld: 261 mg/dL — ABNORMAL HIGH (ref 70–99)
Potassium: 3.8 mEq/L (ref 3.7–5.3)
Sodium: 143 mEq/L (ref 137–147)

## 2014-04-28 LAB — MAGNESIUM: MAGNESIUM: 1.5 mg/dL (ref 1.5–2.5)

## 2014-04-28 MED ORDER — MAGNESIUM SULFATE 40 MG/ML IJ SOLN
2.0000 g | Freq: Once | INTRAMUSCULAR | Status: AC
  Administered 2014-04-28: 2 g via INTRAVENOUS
  Filled 2014-04-28: qty 50

## 2014-04-28 MED ORDER — INSULIN GLARGINE 100 UNIT/ML ~~LOC~~ SOLN
18.0000 [IU] | Freq: Every day | SUBCUTANEOUS | Status: DC
  Administered 2014-04-28: 18 [IU] via SUBCUTANEOUS
  Filled 2014-04-28 (×2): qty 0.18

## 2014-04-28 MED ORDER — POTASSIUM CHLORIDE 10 MEQ/100ML IV SOLN
10.0000 meq | INTRAVENOUS | Status: AC
  Administered 2014-04-28 (×3): 10 meq via INTRAVENOUS
  Filled 2014-04-28: qty 100

## 2014-04-28 MED ORDER — HYDRALAZINE HCL 20 MG/ML IJ SOLN
10.0000 mg | Freq: Once | INTRAMUSCULAR | Status: AC
  Administered 2014-04-28: 10 mg via INTRAVENOUS

## 2014-04-28 MED ORDER — HYDRALAZINE HCL 20 MG/ML IJ SOLN
10.0000 mg | Freq: Once | INTRAMUSCULAR | Status: AC
  Administered 2014-04-28: 10 mg via INTRAVENOUS
  Filled 2014-04-28: qty 1

## 2014-04-28 MED ORDER — HYDRALAZINE HCL 20 MG/ML IJ SOLN: 10.0000 mg | Freq: Once | INTRAMUSCULAR | Status: DC

## 2014-04-28 MED ORDER — HYDRALAZINE HCL 20 MG/ML IJ SOLN
10.0000 mg | INTRAMUSCULAR | Status: DC | PRN
  Administered 2014-05-03 – 2014-05-04 (×2): 10 mg via INTRAVENOUS
  Filled 2014-04-28 (×5): qty 1

## 2014-04-28 MED ORDER — INSULIN ASPART 100 UNIT/ML ~~LOC~~ SOLN
0.0000 [IU] | SUBCUTANEOUS | Status: DC
  Administered 2014-04-28: 4 [IU] via SUBCUTANEOUS
  Administered 2014-04-28 (×2): 7 [IU] via SUBCUTANEOUS
  Administered 2014-04-29: 4 [IU] via SUBCUTANEOUS

## 2014-04-28 MED ORDER — METOPROLOL SUCCINATE ER 50 MG PO TB24
50.0000 mg | ORAL_TABLET | Freq: Two times a day (BID) | ORAL | Status: DC
  Administered 2014-04-28 – 2014-04-29 (×3): 50 mg via ORAL
  Filled 2014-04-28 (×4): qty 1

## 2014-04-28 NOTE — Progress Notes (Signed)
Speech Language Pathology Treatment: Dysphagia  Patient Details Name: [name] [name] MRN: [id number] DOB: 1941/06/18 Today's Date: 04/28/2014 Time: 8527-7824 SLP Time Calculation (min): 14 min  Assessment / Plan / Recommendation Clinical Impression  Pt does not tolerate dys 2 textures; despite masticating sufficiently, pt hold and pumps bolus, needing assist to remove. Will downgrade to puree. With set up assist and min verbal cues, pt was able to self feed with spoon and cup without evidence of aspiration. Pt does have a baseline cough, but it is not associated with PO intake. Reinforced precautions with pt and family. Will sign off, no further SLP needs at this time.    HPI HPI: Patient is a 73 year old female, admitted on 5/19 with CVA and hypoglycemia. CT shows age indeterminate focal hypodensity within the right caudate head/anterior limb of the right internal capsule. CT negative for pna. Pt has a history of CVA. Pt was seen by SLP last month (5/15), showed signs of aspriation with thin straw only, rec Dys 3/thin cup.     Pertinent Vitals NA  SLP Plan  All goals met    Recommendations Diet recommendations: Dysphagia 1 (puree);Thin liquid Liquids provided via: Cup;No straw Medication Administration: Whole meds with puree Supervision: Staff to assist with self feeding;Intermittent supervision to cue for compensatory strategies Compensations: Slow rate;Check for pocketing Postural Changes and/or Swallow Maneuvers: Seated upright 90 degrees;Upright 30-60 min after meal              General recommendations: Rehab consult Oral Care Recommendations: Oral care BID Follow up Recommendations: Inpatient Rehab Plan: All goals met    GO    [name] [place], MA CCC-SLP [phone]  [name] [name] 04/28/2014, 8:51 AM

## 2014-04-28 NOTE — Progress Notes (Signed)
Occupational Therapy Evaluation Patient Details Name: [name] [name] MRN: [id number] DOB: 12-11-1940 Today's Date: 04/28/2014    History of Present Illness Patient is a 73 yo female admitted 04/23/14 with hyperglycemia and encephalopathy. Patient with CVA - Acute infarct in the right anterior basal ganglia .  Patient with h/o HTN, DM with neuropathy, CRI, HLD, CVA in 03/24/14.   Clinical Impression   PTA, pt apparently required set up for ADL. Pt with functional decline and [facility]. Pt will benefit from skilled OT services to facilitate D/C to next venue due to below deficits.    Follow Up Recommendations  SNF;Supervision/Assistance - 24 hour    Equipment Recommendations  None recommended by OT    Recommendations for Other Services       Precautions / Restrictions Precautions Precautions: Fall Restrictions Weight Bearing Restrictions: No      Mobility Bed Mobility Overal bed mobility: Needs Assistance Bed Mobility: Supine to Sit;Sit to Supine Rolling: Min assist Sidelying to sit: Min assist Supine to sit: Min assist Sit to supine: Min assist   General bed mobility comments:  Transfers Overall transfer level: Needs assistance  Transfers: Sit to/from BJ'sStand;Stand Pivot Transfers Sit to Stand: Min assist Stand pivot transfers: Min assist       General transfer comment: increased time required    Balance Overall balance assessment: Needs assistance Sitting-balance support: Feet supported Sitting balance-Leahy Scale: Fair     Standing balance support: During functional activity Standing balance-Leahy Scale: Fair                              ADL Overall ADL's : Needs assistance/impaired Eating/Feeding: Supervision/ safety   Grooming: Set up;Supervision/safety   Upper Body Bathing: Minimal assitance;Sitting   Lower Body Bathing: Moderate assistance;Sit to/from stand   Upper Body Dressing : Moderate assistance;Sitting    Lower Body Dressing: Maximal assistance;Sit to/from stand   Toilet Transfer: Stand-pivot;BSC;Minimal assistance   Toileting- Clothing Manipulation and Hygiene: Minimal assistance;Sit to/from stand       Functional mobility during ADLs: Minimal assistance General ADL Comments: Requires increased time for transfers. Husband present but did not interact     Vision                 Additional Comments: Difficult to assess due to cognition   Perception     Praxis      Pertinent Vitals/Pain VSS; no c/o pain     Hand Dominance Right   Extremity/Trunk Assessment Upper Extremity Assessment Upper Extremity Assessment: Generalized weakness;LUE deficits/detail LUE Deficits / Details: Decreased strength LUE. Able to use as gross asist LUE Coordination: decreased fine motor   Lower Extremity Assessment Lower Extremity Assessment: Generalized weakness   Cervical / Trunk Assessment Cervical / Trunk Assessment: Normal   Communication     Cognition Arousal/Alertness: Awake/alert Behavior During Therapy: Flat affect Overall Cognitive Status: Impaired/Different from baseline Area of Impairment: Orientation;Memory;Safety/judgement;Awareness;Problem solving Orientation Level: Disoriented to;Situation   Memory: Decreased short-term memory   Safety/Judgement: Decreased awareness of deficits;Decreased awareness of safety Awareness: Emergent Problem Solving: Slow processing;Difficulty sequencing General Comments: transferred to toilet and then back to bed. After getting back to bed, pt asked "when am i getting back into the bed?"   General Comments       Exercises       Shoulder Instructions      Home Living Family/patient expects to be discharged to:: Skilled nursing facility  Prior Functioning/Environment Level of Independence: Needs assistance  Gait / Transfers Assistance Needed: Assist to transfer and for  ambulation with RW ADL's / Homemaking Assistance Needed: Assist for bathing/dressing, housekeeping, and meal prep.         OT Diagnosis: Generalized weakness;Cognitive deficits   OT Problem List: Decreased strength;Decreased activity tolerance;Impaired balance (sitting and/or standing);Decreased safety awareness;Decreased cognition;Decreased knowledge of use of DME or AE;Obesity   OT Treatment/Interventions: Self-care/ADL training;Therapeutic exercise;Neuromuscular education;Energy conservation;DME and/or AE instruction;Therapeutic activities;Cognitive remediation/compensation;Patient/family education;Visual/perceptual remediation/compensation;Balance training    OT Goals(Current goals can be found in the care plan section) Acute Rehab OT Goals Patient Stated Goal: none stated OT Goal Formulation: Patient unable to participate in goal setting Time For Goal Achievement: 05/12/14 Potential to Achieve Goals: Good  OT Frequency: Min 2X/week   Barriers to D/C: Decreased caregiver support          Co-evaluation              End of Session Equipment Utilized During Treatment: Gait belt Nurse Communication: Mobility status  Activity Tolerance: Patient tolerated treatment well Patient left: in bed;with call bell/phone within reach;with bed alarm set;with family/visitor present   Time: [id number] OT Time Calculation (min): 19 min Charges:  OT General Charges $OT Visit: 1 Procedure OT Evaluation $Initial OT Evaluation Tier I: 1 Procedure OT Treatments $Self Care/Home Management : 8-22 mins G-Codes:    WARD,HILLARY 04/28/2014, 3:44 PM   [facility] Ward, OTR/L  [phone] 04/28/2014

## 2014-04-28 NOTE — Progress Notes (Signed)
04/28/14 0235  Vitals  BP ! 186/60 mmHg  NP paged additional 10 of Hydralazine ordered will continue to monitor

## 2014-04-28 NOTE — Progress Notes (Signed)
Inpatient Diabetes Program Recommendations  AACE/ADA: New Consensus Statement on Inpatient Glycemic Control (2013)  Target Ranges:  Prepandial:   less than 140 mg/dL      Peak postprandial:   less than 180 mg/dL (1-2 hours)      Critically ill patients:  140 - 180 mg/dL     Results for [name] [name], [name] (MRN [id number]) as of 04/28/2014 08:18  Ref. Range 04/27/2014 07:30 04/27/2014 11:30 04/27/2014 15:18 04/27/2014 21:43  Glucose-Capillary Latest Range: 70-99 mg/dL 782231 (H) 956258 (H) 213204 (H) 258 (H)    Home DM Meds:   70/30 insulin 15 units bidwc  Metformin 1000 mg bid  Amaryl 4 mg daily (per MD notes, patient was supposed to stop Amaryl after last admission but did not stop)    **Note Lantus dose increased to 10 units QHS last evening. Patient still having elevated fasting glucose levels.   **Based on the amount of 70/30 insulin that patient should be taking at home, patient may likely need more basal insulin    MD- Please consider the following in-hospital insulin adjustments:  1. Increase Lantus to 15 units QHS (this dose would be 80% of the long-acting insulin portion of her total 70/30 home dose) 2. If PO Intake decent, please add Novolog Meal Coverage- Novolog 3 units tid with meals (hold if pt NPO, hold if pt eats <50%)    Will follow Ambrose FinlandJeannine Johnston Fishel RN, MSN, CDE Diabetes Coordinator Inpatient Diabetes Program Team Pager: [phone] (8a-10p)]

## 2014-04-28 NOTE — Progress Notes (Signed)
TEAM 1 - Stepdown/ICU TEAM Progress Note  [name] FullingBetty J [name] ZOX:[id number] DOB: Jul 12, 1941 DOA: 04/23/2014 PCP: Alva GarnetSHELTON,KIMBERLY R., MD  Admit HPI / Brief Narrative: 73 year old  BF PMHx HTN, HLD, diabetes type 2 with neuropathy, hypothyroidism  sent to the ER due to altered mentation. Family stated that patient was shaky and sweaty and then became somnolent. They were worried about her blood sugar given prior hx of hypoglycemia. She was noted to have BG >200.   Patient was brought in to ER and was noted to be encephalopathic with increased somnolence. CT showed age indeterminate CVA with MRI recommended. Patient was recently admitted on 5/19 with CVA and hypoglycemia. Lab work was c/w acute renal failure with elevation in cr from 1.8 to 2.6. Patient appeared hemoconcentrated with poor PO intake per family. ABG was attempted but patient was not cooperative.   HPI/Subjective: 6/23 A./O. x3 (does not know why she is here), patient having delirium vs delusions, seen animals biting her arm, rolls of yarn/rope  in the corner which needs untying. Increased confusion today, Follows all commands.     Assessment/Plan: Active Problems:   DM type 2, uncontrolled, with renal complications -not in DKA or HHNS but hyperglycemic -after admission bedside RN received message from social services reporting pt had not received insulin for several days apparently because she could not afford this medicine- also did not have syringes -last admit she was instructed to stop Amaryl but appears she did not -6/19 hemoglobin A1c= 8.9 -Lipid panel within ADA guidelines -Increase Lantus to 18 units QHS -Continue moderate SSI    Metabolic encephalopathy/ History of CVA/acute CVA  -MRI showing acute stroke see results below  -Secondary to ARF, DH, and acute CVA as etiology -UA unremarkable -failed initial swallow eval at bedside , however on 6/20 pass swallow eval.  -SLP plan ;Dysphagia 1  (puree);Nectar-thick liquid Liquids provided via: Cup;No straw -PT; recommends SNF; supervision/assistance 24-hour -OT eval pending  -UDS pending  -6/23 Psychiatry consulted awaiting recommendations     Dehydration -multifactorial due to diuretics and hyperglycemia-at last dc was supposed to stop Lasix but appears as if she did not based on med rec -Patient still unable to drink enough fluids to meet requirements;cont D5W -0.45% sodium chloride at 17400ml/hr    Patient non adherence -may not be able to afford medications -CM consulted; awaiting recommendations NOTE; patient currently does not appear to be competent, and unsure that husband has the mental capacity to make complex medical decisions.    Hypertension urgency -Secondary to acute CVA  -Continue metoprolol IV 10 mg QID -Continue Hydralazine IV 10 mg 4hrs PRN SBP> 170 or DBP> 100 -Continue lisinopril 20 mg daily -Allow permissive HTN    Acute on chronic renal failure/CKD (chronic kidney disease), stage III -suspect combo DH and offending meds (NSAIDs and diuretics,  ACE I)  -pt had been instructed to stop meloxicam and naproxen at discharge last admit but med rec shows her to still be taking -6/23 patient not consuming  PO very well, will continue maintenance fluids normal saline 75 ml/hr  -6/20 Cr 1.13  Peripheral vascular disease, unspecified  Hypothyroidism -cont Synthroid 25 mcg daily    DVT prophylaxis: SCDs Code Status: Full Family Communication: Husband at bedside Disposition Plan/Expected LOS: SNF vs CIR vs home health   Consultants: NA    Procedure/Significant Events: 6/18 CXR; No active cardiopulmonary disease 6/19 CT head without contrast; Age indeterminate focal hypodensity w/i Rt caudate head/anterior limb of the right  internal capsule. No intracranial hemorrhage. 6/19 MRI/MRA brain without contrast; Acute infarct right anterior basal ganglia -Atrophy with moderate to advanced chronic microvascular  ischemia.  -Numerous areas chronic micro hemorrhage in brain consistent with hypertension      Culture 6/19 MRSA by PCR negative   Antibiotics: NA   DVT prophylaxis: SCD   Devices NA   LINES / TUBES:  6/18 20ga left wrist    Continuous Infusions: . sodium chloride 20 mL/hr at 04/27/14 2115    Objective: VITAL SIGNS: Temp: 98.9 F (37.2 C) (06/23 0700) Temp src: Axillary (06/23 0700) BP: 158/58 mmHg (06/23 0457) Pulse Rate: 97 (06/23 0457) SPO2; 97% on room air FIO2:   Intake/Output Summary (Last 24 hours) at 04/28/14 0820 Last data filed at 04/28/14 0600  Gross per 24 hour  Intake   1875 ml  Output    850 ml  Net   1025 ml     Exam: General: A./O. x3 (does not know why she is here), patient having delirium vs delusions, seen animals biting her arm, rolls of yarn/rope  in the corner which needs untying, NAD, No acute respiratory distress Lungs: Clear to auscultation bilaterally without wheezes or crackles Cardiovascular: Regular rate and rhythm without murmur gallop or rub normal S1 and S2 Abdomen: Obese, Nontender, nondistended, soft, bowel sounds positive, no rebound, no ascites, no appreciable mass Extremities: No significant cyanosis, clubbing, or edema bilateral lower extremities Neurologic; pupils equal round reactive to light and accommodation, cranial nerves II through XII intact, tongue/uvula midline, mild left hemiparesis 4/5 strength in upper/lower extremity, sensation intact throughout, patient able to ambulate with one-person assist.  Data Reviewed: Basic Metabolic Panel:  Recent Labs Lab 04/23/14 2258 04/23/14 2305 04/25/14 0315 04/26/14 1633 04/27/14 0200 04/28/14 0245  NA 143 143 146 142 143 143  K 4.5 4.2 3.2* 3.8 2.8* 3.8  CL 100 105 105 107 106 108  CO2 25  --  23 18* 23 21  GLUCOSE 233* 224* 89 187* 215* 261*  BUN 31* 36* 26* 10 8 7   CREATININE 2.37* 2.40* 1.58* 1.08 1.18* 1.13*  CALCIUM 9.6  --  8.6 8.3* 8.6 8.4  MG  --    --  1.4* 1.4* 1.4*  --   PHOS  --   --  3.2  --   --   --    Liver Function Tests:  Recent Labs Lab 04/25/14 0315  AST 18  ALT 7  ALKPHOS 52  BILITOT 0.3  PROT 6.8  ALBUMIN 3.2*   No results found for this basename: LIPASE, AMYLASE,  in the last 168 hours No results found for this basename: AMMONIA,  in the last 168 hours CBC:  Recent Labs Lab 04/23/14 2258 04/23/14 2305 04/25/14 0315 04/26/14 1633 04/27/14 0200 04/28/14 0245  WBC 8.0  --  7.0 5.5 6.2 5.9  NEUTROABS 4.9  --   --   --   --   --   HGB 12.7 13.6 11.5* 12.1 12.4 11.8*  HCT 37.0 40.0 33.9* 35.3* 36.5 34.6*  MCV 87.3  --  86.0 86.9 85.9 86.3  PLT 251  --  211 168 234 223   Cardiac Enzymes:  Recent Labs Lab 04/23/14 2258 04/24/14 0915 04/24/14 1657 04/24/14 2249  TROPONINI <0.30 <0.30 <0.30 <0.30   BNP (last 3 results) No results found for this basename: PROBNP,  in the last 8760 hours CBG:  Recent Labs Lab 04/26/14 2203 04/27/14 0730 04/27/14 1130 04/27/14 1518 04/27/14 2143  GLUCAP  245* 231* 258* 204* 258*    Recent Results (from the past 240 hour(s))  MRSA PCR SCREENING     Status: None   Collection Time    04/24/14  8:50 AM      Result Value Ref Range Status   MRSA by PCR NEGATIVE  NEGATIVE Final   Comment:            The GeneXpert MRSA Assay (FDA     approved for NASAL specimens     only), is one component of a     comprehensive MRSA colonization     surveillance program. It is not     intended to diagnose MRSA     infection nor to guide or     monitor treatment for     MRSA infections.     Studies:  Recent x-ray studies have been reviewed in detail by the Attending Physician  Scheduled Meds:  Scheduled Meds: . antiseptic oral rinse  15 mL Mouth Rinse q12n4p  . atorvastatin  40 mg Oral Daily  . chlorhexidine  15 mL Mouth Rinse BID  . clopidogrel  75 mg Oral Q breakfast  . feeding supplement (GLUCERNA SHAKE)  237 mL Oral BID BM  . furosemide  40 mg Oral Daily  .  insulin aspart  0-15 Units Subcutaneous TID WC  . insulin glargine  10 Units Subcutaneous QHS  . latanoprost  1 drop Both Eyes QHS  . levothyroxine  50 mcg Oral QAC breakfast  . lisinopril  20 mg Oral Daily  . metoprolol succinate  25 mg Oral BID  . olopatadine  1 drop Both Eyes BID  . potassium chloride SA  20 mEq Oral Daily  . sodium chloride  3 mL Intravenous Q12H    Time spent on care of this patient: 40 mins   Drema DallasWOODS, Kmari Halter, J , MD   [facility] Office  [phone] Pager [phone]- [phone]  On-Call/Text Page:      [email or web address]      password [email or web address]  If 7PM-7AM, please contact night-coverage [email or web address] Password [email or web address] 04/28/2014, 8:20 AM   LOS: 5 days

## 2014-04-28 NOTE — Progress Notes (Signed)
Physical Therapy Treatment Patient Details Name: [name] [name] MRN: [id number] DOB: [birth year] Today's Date: 04/28/2014    History of Present Illness Patient is a 73 yo female admitted 04/23/14 with hyperglycemia and encephalopathy. Patient with CVA.  Patient with h/o HTN, DM with neuropathy, CRI, HLD, CVA in 03/24/14.    PT Comments    Patient with some progress with ambulation and activity tolerance today. Continues to require assist. POC appropriate, will continue to see and progress as tolerated.  Follow Up Recommendations  SNF;Supervision/Assistance - 24 hour     Equipment Recommendations  None recommended by PT    Recommendations for Other Services       Precautions / Restrictions Precautions Precautions: Fall Restrictions Weight Bearing Restrictions: No    Mobility  Bed Mobility Overal bed mobility: Needs Assistance;+ 2 for safety/equipment Bed Mobility: Rolling;Sidelying to Sit Rolling: Min assist Sidelying to sit: Mod assist;+2 for safety/equipment       General bed mobility comments: Verbal cues for technique.  Assist to move LE's off of bed, and to raise trunk to sitting position.  Assist with sitting balance initially.  Transfers Overall transfer level: Needs assistance Equipment used: Rolling walker (2 wheeled) Transfers: Sit to/from Stand Sit to Stand: Mod assist;+2 safety/equipment         General transfer comment: Verbal cues for hand placement. Assist to rise to standing and for balance.  Ambulation/Gait Ambulation/Gait assistance: Mod assist Ambulation Distance (Feet): 70 Feet Assistive device: Rolling walker (2 wheeled) Gait Pattern/deviations: Step-through pattern;Decreased stride length;Shuffle;Trunk flexed Gait velocity: Decreased Gait velocity interpretation: <1.8 ft/sec, indicative of risk for recurrent falls General Gait Details: VCs and manual assist for pacing, when not providing manual assist patient demonstrates decreased initiation  of steps. manual assist for maneuvering RW and cues for posture.    Stairs            Wheelchair Mobility    Modified Rankin (Stroke Patients Only)       Balance                                    Cognition Arousal/Alertness: Awake/alert Behavior During Therapy: Flat affect Overall Cognitive Status: Impaired/Different from baseline               Problem Solving: Slow processing;Decreased initiation;Requires verbal cues General Comments: minimally verbal today    Exercises      General Comments        Pertinent Vitals/Pain Patient denies pain, VSS, HR elevated with activity 120s    Home Living                      Prior Function            PT Goals (current goals can now be found in the care plan section) Acute Rehab PT Goals Patient Stated Goal: none stated PT Goal Formulation: With patient Time For Goal Achievement: 05/09/14 Potential to Achieve Goals: Good Progress towards PT goals: Progressing toward goals    Frequency  Min 3X/week    PT Plan Current plan remains appropriate    Co-evaluation             End of Session Equipment Utilized During Treatment: Gait belt Activity Tolerance: Patient limited by fatigue Patient left: in chair;with call bell/phone within reach     Time: 1151-1216 PT Time Calculation (min): 25 min  Charges:  $Gait Training: 8-22 mins $  Therapeutic Activity: 8-22 mins                    G CodesFabio Asa:      Jadalee Westcott J 04/28/2014, 2:48 PM Charlotte Crumbevon Earvin Blazier, PT DPT  [phone]

## 2014-04-28 NOTE — Consult Note (Signed)
[name] Face-to-Face Psychiatry Consult   Reason for Consult:  AMS and capacity evaluation Referring Physician:  [name] is an 73 y.o. [name]. Total Time spent with patient: 45 minutes  Assessment: AXIS I:  Delirium AXIS II:  Deferred AXIS III:   Past Medical History  Diagnosis Date  . Hypertension   . Diabetes mellitus   . Chronic renal insufficiency   . Hyperlipidemia   . Osteopenia   . CVA (cerebral infarction)    AXIS IV:  other psychosocial or environmental problems, problems related to social environment and problems with primary support group AXIS V:  41-50 serious symptoms  Plan:  Case discussed with Dr. Sherral Hammers Supportive therapy provided about ongoing stressors. Appreciate psychiatric consultation and follow up tomorrow Please contact [phone] if needs further assistance  Subjective:   [name] [name] is a 73 y.o. [name] patient admitted with AMS and new onset stroke.  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 Reportedly patient has been staying herself and people around her are helping to go to church. She has been able to function until she suffered with CVA which caused her side weak and has speech difficulties. Patient is unable to participate in this evaluation, she was unable to wake up for OT who was at bed side. Patient primary team and family believe she does not have capacity to care for herself at this time. Patient Niece and sister has MCPOA. Patient will be seen tomorrow for additional evaluation and examination.   Medical history: [name] [name] is a 73 y.o. [name] has a past medical history of Hypertension; Diabetes mellitus; Chronic renal insufficiency; Hyperlipidemia; Osteopenia; and CVA (cerebral infarction). She was presented with altered mental status. Her family states that patient was shaky and sweaty and then became somnolent. They were worried about her blood  sugar given prior hx of hypoglycemia. She was noted to have BG >200. Patient was brought in to ER and was noted to be encephalopathic with increased somnolence. CT showed intermediate acuity CVA. Patient was admitted on 5/19 with CVA and hypoglycemia. Lab work was noted for increased cr from 1.8 to 2.6. Patient appeared hemoconcentrated With poor PO intake per family. ABG was attempted but patient was not cooperative. Hospitalist was called for admission for encephalopathy and acute on chronic renal failure.   Review of Systems:  Unable to obtain given somnolence  HPI Elements:   Location:  delirium. Quality:  poor. Severity:  acute. Timing:  CVA.  Past Psychiatric History: Past Medical History  Diagnosis Date  . Hypertension   . Diabetes mellitus   . Chronic renal insufficiency   . Hyperlipidemia   . Osteopenia   . CVA (cerebral infarction)     reports that she has been smoking Cigarettes.  She has a 30 pack-year smoking history. She does not have any smokeless tobacco history on file. She reports that she drinks alcohol. She reports that she does not use illicit drugs. Family History  Problem Relation Age of Onset  . Diabetes Mother   . Hypertension Mother      Living Arrangements: Spouse/significant other   Abuse/Neglect [facility]) Physical Abuse: Denies Verbal Abuse: Denies Sexual Abuse: Denies Allergies:   Allergies  Allergen Reactions  . Erythromycin Other (See Comments)    unknown  . Iodine     REACTION: hives  . Penicillins     REACTION: hives  ACT Assessment Complete:  NO Objective: Blood pressure 175/73, pulse 107, temperature 98.9 F (37.2 C), temperature source Axillary, resp. rate 22, height _0  (1.575 m), weight 74.3 kg (163 lb 12.8 oz), SpO2 100.00%.Body mass index is 29.95 kg/(m^2). Results for orders placed during the hospital encounter of 04/23/14 (from the past 72 hour(s))  GLUCOSE, CAPILLARY     Status: Abnormal   Collection Time    04/25/14  11:25 AM      Result Value Ref Range   Glucose-Capillary 122 (*) 70 - 99 mg/dL   Comment 1 Documented in Chart     Comment 2 Notify RN    GLUCOSE, CAPILLARY     Status: Abnormal   Collection Time    04/25/14  3:47 PM      Result Value Ref Range   Glucose-Capillary 176 (*) 70 - 99 mg/dL  GLUCOSE, CAPILLARY     Status: Abnormal   Collection Time    04/25/14  8:24 PM      Result Value Ref Range   Glucose-Capillary 251 (*) 70 - 99 mg/dL   Comment 1 Notify RN    GLUCOSE, CAPILLARY     Status: Abnormal   Collection Time    04/25/14 11:51 PM      Result Value Ref Range   Glucose-Capillary 160 (*) 70 - 99 mg/dL   Comment 1 Notify RN    GLUCOSE, CAPILLARY     Status: Abnormal   Collection Time    04/26/14  3:51 AM      Result Value Ref Range   Glucose-Capillary 134 (*) 70 - 99 mg/dL   Comment 1 Notify RN    GLUCOSE, CAPILLARY     Status: Abnormal   Collection Time    04/26/14  8:02 AM      Result Value Ref Range   Glucose-Capillary 223 (*) 70 - 99 mg/dL   Comment 1 Documented in Chart     Comment 2 Notify RN    GLUCOSE, CAPILLARY     Status: Abnormal   Collection Time    04/26/14 12:25 PM      Result Value Ref Range   Glucose-Capillary 170 (*) 70 - 99 mg/dL   Comment 1 Documented in Chart     Comment 2 Notify RN    GLUCOSE, CAPILLARY     Status: Abnormal   Collection Time    04/26/14  3:51 PM      Result Value Ref Range   Glucose-Capillary 185 (*) 70 - 99 mg/dL   Comment 1 Documented in Chart     Comment 2 Notify RN    BASIC METABOLIC PANEL     Status: Abnormal   Collection Time    04/26/14  4:33 PM      Result Value Ref Range   Sodium 142  137 - 147 mEq/L   Potassium 3.8  3.7 - 5.3 mEq/L   Chloride 107  96 - 112 mEq/L   CO2 18 (*) 19 - 32 mEq/L   Glucose, Bld 187 (*) 70 - 99 mg/dL   BUN 10  6 - 23 mg/dL   Creatinine, Ser 1.08  0.50 - 1.10 mg/dL   Calcium 8.3 (*) 8.4 - 10.5 mg/dL   GFR calc non Af Amer 49 (*) >90 mL/min   GFR calc Af Amer 57 (*) >90 mL/min    Comment: (NOTE)     The eGFR has been calculated using the CKD EPI equation.     This calculation  has not been validated in all clinical situations.     eGFR's persistently <90 mL/min signify possible Chronic Kidney     Disease.  MAGNESIUM     Status: Abnormal   Collection Time    04/26/14  4:33 PM      Result Value Ref Range   Magnesium 1.4 (*) 1.5 - 2.5 mg/dL  CBC     Status: Abnormal   Collection Time    04/26/14  4:33 PM      Result Value Ref Range   WBC 5.5  4.0 - 10.5 K/uL   RBC 4.06  3.87 - 5.11 MIL/uL   Hemoglobin 12.1  12.0 - 15.0 g/dL   HCT 35.3 (*) 36.0 - 46.0 %   MCV 86.9  78.0 - 100.0 fL   MCH 29.8  26.0 - 34.0 pg   MCHC 34.3  30.0 - 36.0 g/dL   RDW 12.8  11.5 - 15.5 %   Platelets 168  150 - 400 K/uL  URINE RAPID DRUG SCREEN (HOSP PERFORMED)     Status: None   Collection Time    04/26/14  8:25 PM      Result Value Ref Range   Opiates NONE DETECTED  NONE DETECTED   Cocaine NONE DETECTED  NONE DETECTED   Benzodiazepines NONE DETECTED  NONE DETECTED   Amphetamines NONE DETECTED  NONE DETECTED   Tetrahydrocannabinol NONE DETECTED  NONE DETECTED   Barbiturates NONE DETECTED  NONE DETECTED   Comment:            DRUG SCREEN FOR MEDICAL PURPOSES     ONLY.  IF CONFIRMATION IS NEEDED     FOR ANY PURPOSE, NOTIFY LAB     WITHIN 5 DAYS.                LOWEST DETECTABLE LIMITS     FOR URINE DRUG SCREEN     Drug Class       Cutoff (ng/mL)     Amphetamine      1000     Barbiturate      200     Benzodiazepine   340     Tricyclics       352     Opiates          300     Cocaine          300     THC              50  GLUCOSE, CAPILLARY     Status: Abnormal   Collection Time    04/26/14 10:03 PM      Result Value Ref Range   Glucose-Capillary 245 (*) 70 - 99 mg/dL   Comment 1 Notify RN    BASIC METABOLIC PANEL     Status: Abnormal   Collection Time    04/27/14  2:00 AM      Result Value Ref Range   Sodium 143  137 - 147 mEq/L   Potassium 2.8 (*) 3.7 - 5.3 mEq/L    Comment: CRITICAL RESULT CALLED TO, READ BACK BY AND VERIFIED WITH:     IRBY,T RN 04/27/2014 0349 JORDANS     REPEATED TO VERIFY   Chloride 106  96 - 112 mEq/L   CO2 23  19 - 32 mEq/L   Glucose, Bld 215 (*) 70 - 99 mg/dL   BUN 8  6 - 23 mg/dL   Creatinine, Ser 1.18 (*) 0.50 - 1.10 mg/dL  Calcium 8.6  8.4 - 10.5 mg/dL   GFR calc non Af Amer 44 (*) >90 mL/min   GFR calc Af Amer 51 (*) >90 mL/min   Comment: (NOTE)     The eGFR has been calculated using the CKD EPI equation.     This calculation has not been validated in all clinical situations.     eGFR's persistently <90 mL/min signify possible Chronic Kidney     Disease.  MAGNESIUM     Status: Abnormal   Collection Time    04/27/14  2:00 AM      Result Value Ref Range   Magnesium 1.4 (*) 1.5 - 2.5 mg/dL  CBC     Status: None   Collection Time    04/27/14  2:00 AM      Result Value Ref Range   WBC 6.2  4.0 - 10.5 K/uL   RBC 4.25  3.87 - 5.11 MIL/uL   Hemoglobin 12.4  12.0 - 15.0 g/dL   HCT 36.5  36.0 - 46.0 %   MCV 85.9  78.0 - 100.0 fL   MCH 29.2  26.0 - 34.0 pg   MCHC 34.0  30.0 - 36.0 g/dL   RDW 12.8  11.5 - 15.5 %   Platelets 234  150 - 400 K/uL   Comment: SPECIMEN CHECKED FOR CLOTS     REPEATED TO VERIFY     PLATELET COUNT CONFIRMED BY SMEAR  GLUCOSE, CAPILLARY     Status: Abnormal   Collection Time    04/27/14  7:30 AM      Result Value Ref Range   Glucose-Capillary 231 (*) 70 - 99 mg/dL   Comment 1 Documented in Chart     Comment 2 Notify RN    GLUCOSE, CAPILLARY     Status: Abnormal   Collection Time    04/27/14 11:30 AM      Result Value Ref Range   Glucose-Capillary 258 (*) 70 - 99 mg/dL   Comment 1 Documented in Chart     Comment 2 Notify RN    GLUCOSE, CAPILLARY     Status: Abnormal   Collection Time    04/27/14  3:18 PM      Result Value Ref Range   Glucose-Capillary 204 (*) 70 - 99 mg/dL   Comment 1 Documented in Chart     Comment 2 Notify RN    GLUCOSE, CAPILLARY     Status: Abnormal    Collection Time    04/27/14  9:43 PM      Result Value Ref Range   Glucose-Capillary 258 (*) 70 - 99 mg/dL  BASIC METABOLIC PANEL     Status: Abnormal   Collection Time    04/28/14  2:45 AM      Result Value Ref Range   Sodium 143  137 - 147 mEq/L   Potassium 3.8  3.7 - 5.3 mEq/L   Comment: DELTA CHECK NOTED     NO VISIBLE HEMOLYSIS   Chloride 108  96 - 112 mEq/L   CO2 21  19 - 32 mEq/L   Glucose, Bld 261 (*) 70 - 99 mg/dL   BUN 7  6 - 23 mg/dL   Creatinine, Ser 1.13 (*) 0.50 - 1.10 mg/dL   Calcium 8.4  8.4 - 10.5 mg/dL   GFR calc non Af Amer 47 (*) >90 mL/min   GFR calc Af Amer 54 (*) >90 mL/min   Comment: (NOTE)     The eGFR has been calculated  using the CKD EPI equation.     This calculation has not been validated in all clinical situations.     eGFR's persistently <90 mL/min signify possible Chronic Kidney     Disease.  CBC     Status: Abnormal   Collection Time    04/28/14  2:45 AM      Result Value Ref Range   WBC 5.9  4.0 - 10.5 K/uL   RBC 4.01  3.87 - 5.11 MIL/uL   Hemoglobin 11.8 (*) 12.0 - 15.0 g/dL   HCT 34.6 (*) 36.0 - 46.0 %   MCV 86.3  78.0 - 100.0 fL   MCH 29.4  26.0 - 34.0 pg   MCHC 34.1  30.0 - 36.0 g/dL   RDW 12.9  11.5 - 15.5 %   Platelets 223  150 - 400 K/uL   Labs are reviewed and are pertinent for .  Current Facility-Administered Medications  Medication Dose Route Frequency Provider Last Rate Last Dose  . 0.9 %  sodium chloride infusion   Intravenous Continuous Cherene Altes, MD 20 mL/hr at 04/27/14 2115    . acetaminophen (TYLENOL) tablet 650 mg  650 mg Oral Q6H PRN Toy Baker, MD       Or  . acetaminophen (TYLENOL) suppository 650 mg  650 mg Rectal Q6H PRN Toy Baker, MD      . antiseptic oral rinse (BIOTENE) solution 15 mL  15 mL Mouth Rinse q12n4p Allie Bossier, MD   15 mL at 04/27/14 1607  . atorvastatin (LIPITOR) tablet 40 mg  40 mg Oral Daily Cherene Altes, MD   40 mg at 04/27/14 2114  . chlorhexidine (PERIDEX)  0.12 % solution 15 mL  15 mL Mouth Rinse BID Allie Bossier, MD   15 mL at 04/28/14 0759  . clopidogrel (PLAVIX) tablet 75 mg  75 mg Oral Q breakfast Cherene Altes, MD   75 mg at 04/28/14 0759  . feeding supplement (GLUCERNA SHAKE) (GLUCERNA SHAKE) liquid 237 mL  237 mL Oral BID BM Dagmar Hait, RD   237 mL at 04/27/14 1420  . furosemide (LASIX) tablet 40 mg  40 mg Oral Daily Cherene Altes, MD      . insulin aspart (novoLOG) injection 0-20 Units  0-20 Units Subcutaneous 6 times per day Allie Bossier, MD      . insulin glargine (LANTUS) injection 18 Units  18 Units Subcutaneous QHS Allie Bossier, MD      . latanoprost (XALATAN) 0.005 % ophthalmic solution 1 drop  1 drop Both Eyes QHS Toy Baker, MD   1 drop at 04/26/14 2300  . levothyroxine (SYNTHROID, LEVOTHROID) tablet 50 mcg  50 mcg Oral QAC breakfast Cherene Altes, MD   50 mcg at 04/28/14 0759  . lisinopril (PRINIVIL,ZESTRIL) tablet 20 mg  20 mg Oral Daily Cherene Altes, MD   20 mg at 04/27/14 2114  . magnesium sulfate IVPB 2 g 50 mL  2 g Intravenous Once Allie Bossier, MD   2 g at 04/28/14 0920  . metoprolol succinate (TOPROL-XL) 24 hr tablet 50 mg  50 mg Oral BID Allie Bossier, MD      . olopatadine (PATANOL) 0.1 % ophthalmic solution 1 drop  1 drop Both Eyes BID Toy Baker, MD   1 drop at 04/27/14 2115  . ondansetron (ZOFRAN) tablet 4 mg  4 mg Oral Q6H PRN Toy Baker, MD       Or  .  ondansetron (ZOFRAN) injection 4 mg  4 mg Intravenous Q6H PRN Toy Baker, MD      . potassium chloride 10 mEq in 100 mL IVPB  10 mEq Intravenous Q1 Hr x 3 Allie Bossier, MD      . potassium chloride SA (K-DUR,KLOR-CON) CR tablet 20 mEq  20 mEq Oral Daily Cherene Altes, MD      . sodium chloride 0.9 % injection 3 mL  3 mL Intravenous Q12H Toy Baker, MD   3 mL at 04/25/14 0935    Psychiatric Specialty Exam: Physical Exam  ROS  Blood pressure 175/73, pulse 107, temperature 98.9 F (37.2 C),  temperature source Axillary, resp. rate 22, height _0  (1.575 m), weight 74.3 kg (163 lb 12.8 oz), SpO2 100.00%.Body mass index is 29.95 kg/(m^2).  General Appearance: Guarded  Eye Contact::  could not wake up  Speech:  NA  Volume:  NA  Mood:  NA  Affect:  NA  Thought Process:  NA  Orientation:  NA  Thought Content:  NA  Suicidal Thoughts:  No  Homicidal Thoughts:  No  Memory:  NA  Judgement:  NA  Insight:  NA  Psychomotor Activity:  NA  Concentration:  NA  Recall:  NA  Fund of Knowledge:NA  Language: NA  Akathisia:  NA  Handed:  Right  AIMS (if indicated):     Assets:  Communication Skills Desire for Improvement Housing Leisure Time Resilience Social Support Talents/Skills  Sleep:      Musculoskeletal: Strength & Muscle Tone: decreased Gait & Station: unable to stand Patient leans: N/A  Treatment Plan Summary: Daily contact with patient to assess and evaluate symptoms and progress in treatment Medication management  [name] [name],[name] R. 04/28/2014 10:18 AM

## 2014-04-28 NOTE — Progress Notes (Signed)
04/28/14 0032  Vitals  BP ! 190/66 mmHg (RN notified)  NP notified, new orders received will continue to monitor

## 2014-04-28 NOTE — Progress Notes (Signed)
Patient is active with [facility].  We have attempted to address the family for social economic barriers to care.  Our community team currently feels that a higher level of care is appropriate as the family has not demonstrated consistent ability to manage her medical complexity at home. Transfer to SNF is supported at this time.  Will continue to monitor.  Of note, [facility] services does not replace or interfere with any services that are arranged by inpatient case management or social work.  For additional questions or referrals please contact Anibal Hendersonim Henderson BSN RN [facility] Liaison at [phone].

## 2014-04-29 LAB — GLUCOSE, CAPILLARY
GLUCOSE-CAPILLARY: 235 mg/dL — AB (ref 70–99)
GLUCOSE-CAPILLARY: 206 mg/dL — AB (ref 70–99)
Glucose-Capillary: 66 mg/dL — ABNORMAL LOW (ref 70–99)
Glucose-Capillary: 83 mg/dL (ref 70–99)
Glucose-Capillary: 165 mg/dL — ABNORMAL HIGH (ref 70–99)

## 2014-04-29 LAB — MAGNESIUM: MAGNESIUM: 1.9 mg/dL (ref 1.5–2.5)

## 2014-04-29 MED ORDER — INSULIN GLARGINE 100 UNIT/ML ~~LOC~~ SOLN
14.0000 [IU] | Freq: Every day | SUBCUTANEOUS | Status: DC
  Administered 2014-04-29 – 2014-05-03 (×5): 14 [IU] via SUBCUTANEOUS
  Filled 2014-04-29 (×6): qty 0.14

## 2014-04-29 MED ORDER — HYDRALAZINE HCL 10 MG PO TABS
10.0000 mg | ORAL_TABLET | Freq: Three times a day (TID) | ORAL | Status: DC
  Administered 2014-04-29 – 2014-04-30 (×3): 10 mg via ORAL
  Filled 2014-04-29 (×6): qty 1

## 2014-04-29 MED ORDER — INSULIN ASPART 100 UNIT/ML ~~LOC~~ SOLN
0.0000 [IU] | Freq: Three times a day (TID) | SUBCUTANEOUS | Status: DC
  Administered 2014-04-29: 7 [IU] via SUBCUTANEOUS
  Administered 2014-04-30 (×2): 4 [IU] via SUBCUTANEOUS
  Administered 2014-05-01: 3 [IU] via SUBCUTANEOUS
  Administered 2014-05-01: 4 [IU] via SUBCUTANEOUS
  Administered 2014-05-02: 1 [IU] via SUBCUTANEOUS
  Administered 2014-05-02 – 2014-05-03 (×2): 4 [IU] via SUBCUTANEOUS
  Administered 2014-05-03: 3 [IU] via SUBCUTANEOUS
  Administered 2014-05-04: 7 [IU] via SUBCUTANEOUS

## 2014-04-29 MED ORDER — CARVEDILOL 12.5 MG PO TABS
12.5000 mg | ORAL_TABLET | Freq: Two times a day (BID) | ORAL | Status: DC
  Administered 2014-04-29 – 2014-04-30 (×2): 12.5 mg via ORAL
  Filled 2014-04-29 (×4): qty 1

## 2014-04-29 NOTE — Progress Notes (Signed)
Report given to Tai RN on 6E, patient to be transferred to [place], via bed, husband remains at bedside, son in to visit, Hazle Nordmann RN

## 2014-04-29 NOTE — Progress Notes (Signed)
Spoke with family at bedside with LCSW.  All agreeable on SNF placement.  Son Mr Uvaldo RisingMcneil is seeking HCPOA status.  Encouraged him to alert the SNF that long term placement may be necessary.  He agreed.  Will continue to monitor.  Of note, [facility] services does not replace or interfere with any services that are arranged by inpatient case management or social work.  For additional questions or referrals please contact Anibal Hendersonim Henderson BSN RN [facility]iaison at [phone].

## 2014-04-29 NOTE — Care Management Note (Signed)
    Page 1 of 2   04/29/2014     2:02:45 PM CARE MANAGEMENT NOTE 04/29/2014  Patient:  [name] [name],[name] [name]   Account Number:  [id number]  Date Initiated:  04/24/2014  Documentation initiated by:  [facility]ALESIA  Subjective/Objective Assessment:   Metabolic encephalopathy     Action/Plan:   lives at home with husband, [name] [name]   Anticipated DC Date:     Anticipated DC Plan:  SKILLED NURSING FACILITY      DC Planning Services  CM consult      Choice offered to / List presented to:             Status of service:  Completed, signed off Medicare Important Message given?  YES (If response is "NO", the following Medicare IM given date fields will be blank) Date Medicare IM given:  04/27/2014 Date Additional Medicare IM given:    Discharge Disposition:  SKILLED NURSING FACILITY  Per UR Regulation:  Reviewed for med. necessity/level of care/duration of stay  If discussed at [name] Length of Stay Meetings, dates discussed:   04/28/2014  04/30/2014    Comments:  04-29-14 1347 [name] BambergerBrenda Graves-Bigelow, RN,BSN [phone] Per MD notes- plan to monitor BP overnight with change in BP meds and possible d/c in am to SNF. CSW assisting with disposition needs. CSW also provided pt's husband with meal vouchers. No further needs from CM.   04-27-14 1223 [name] BambergerBrenda Graves-Bigelow, RN,BSN [phone] CM will continue to f/u with pt for disposition needs. Pt is from home with husband, however they both may benefit from Placement. CM will continue to monitor for disposition needs.   04/24/2014 1215 NCM spoke to son, Catalina PizzaLesley [name] [phone]. Son states he is the POA, requested copy of paperwork for chart. States he and his brother assist with giving medications each day. They pick up her medications from the pharmacy. States the goal is to go back home but will consider SNF if pt not able to dc home. Son states he has never spoken to [place] [name], #  [phone] CSW comes from Special Assistance Program with  Social Services. Please contact son, [name] [name] with any updates. [name] DonningAlesia Shavis RN CCM Case Mgmt phone [phone]

## 2014-04-29 NOTE — Clinical Social Work Note (Signed)
Verbal handoff given to 6E CSW. This CSW signing off.  Roddie McBryant Campbell MSW, [place] Club[place]CSWA, [place], [phone]

## 2014-04-29 NOTE — Clinical Social Work Note (Signed)
CSW spoke with Andrey CampanileSandy, APS social worker involved in patient's case. Andrey CampanileSandy is going to address the safety concerns of husband at bedside as husband seems to be staying at hospital and hasn't changed his clothes in a long time. CSW will see if we can provide clothing or meal vouchers for husband.   Roddie McBryant Campbell MSW, [place] Hills[place]CSWA, [place], [phone]

## 2014-04-29 NOTE — Progress Notes (Signed)
Inpatient Diabetes Program Recommendations  AACE/ADA: New Consensus Statement on Inpatient Glycemic Control (2013)  Target Ranges:  Prepandial:   less than 140 mg/dL      Peak postprandial:   less than 180 mg/dL (1-2 hours)      Critically ill patients:  140 - 180 mg/dL  Results for Erin FullingVANS, Quadasia J (MRN [id number]) as of 04/29/2014 11:06  Ref. Range 04/29/2014 03:13  Glucose-Capillary Latest Range: 70-99 mg/dL 66 (L)   Inpatient Diabetes Program Recommendations Correction (SSI): change from q 4 to TID ac Thank you  Piedad ClimesGina Davis BSN, RN,CDE Inpatient Diabetes Coordinator [phone] (team pager)

## 2014-04-29 NOTE — Clinical Social Work Note (Signed)
Patient's son [name] [facility]  Roddie McBryant Campbell MSW, [place]CSWA, [place], [phone]

## 2014-04-29 NOTE — Progress Notes (Signed)
Chaplain responded to request for advance directive/healthcare power of attorney assistance. Consulted with pt's RNs who said that patient has at times been confused and at times oriented. Chaplain spoke with pt and pt appeared to be competent, oriented, alert, and of sound mound during discussion and while completing AD. Reviewed documents with pt and her son. Pt expressed understanding of HCPOA and communicated her wishes. Chaplain organized notarization and provided copies for pt, her son, her PCP, and her RN.   Maurene CapesHillary D Irusta [phone]

## 2014-04-29 NOTE — Progress Notes (Signed)
[facility] TEAM 1 - Stepdown/ICU TEAM Progress Note  [name] [name] ZOX:[id number] DOB: 01-Oct-1941 DOA: 04/23/2014 PCP: Alva Garnet., MD  Admit HPI / Brief Narrative: 73 year old  F w/ a Hx of HTN, HLD, diabetes type 2 with neuropathy, and hypothyroidism who was sent to the ER due to altered mentation. Family stated that patient was shaky and sweaty and then became somnolent. They were worried about her blood sugar given prior hx of hypoglycemia. She was noted to have CBG >200.   Patient was brought in to ER and was noted to be encephalopathic. CT showed age indeterminate CVA with MRI recommended. Patient was previously admitted on 5/19 with CVA and hypoglycemia. Lab work was c/w acute renal failure with elevation in cr from 1.8 to 2.6. Patient appeared hemoconcentrated with poor PO intake per family.   HPI/Subjective: Pt is alert and conversant.  She is not fully oriented.  She denies any complaints at this time.    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 Joseph Art has indicated that Cardiology has been consulted and did evaluate the loop recorder -NSR on monitor - resume Plavix as cleared for diet 6/20  Metabolic encephalopathy -Secondary to ARF, DH, and acute CVA as etiology -UA unremarkable -appears to be approaching her baseline MS  -Psychiatry currently evaluating her capacity   DM type 2,  uncontrolled, with renal complications -CBG is variable, with some episodes of hypoglycemia this morning  -after admission bedside RN received message from social services reporting pt had not received insulin for several days apparently because she could not afford this medicine - also did not have syringes -last admit she was instructed to stop Amaryl but appears she did not -A1c 8.9 -Lipid panel within ADA guidelines -decrease lantus dose today and cont to follow  -Continue moderate SSI  Dehydration -multifactorial due to diuretics and hyperglycemia - at last dc was supposed to stop Lasix but appears as if she did not based on med rec -now appears euvolemic - off IVF   Patient non adherence -may not be able to afford medications -CM consulted; awaiting recommendations -NOTE: patient currently does not appear to be competent, and unsure that husband has the mental capacity to make complex medical decisions - Psych opinion pending   Acute on chronic renal failure/CKD (chronic kidney disease), stage III -suspect combo DH and offending meds (NSAIDs and diuretics,  ACE I)  -pt had been instructed to stop meloxicam and naproxen at discharge last admit but med rec shows her to still be taking -crt appears to have reached her baseline - follow up in AM   Peripheral vascular disease, unspecified  Hypothyroidism -cont Synthroid 25 mcg daily  DVT prophylaxis: SCDs Code Status: Full Family Communication: Husband at bedside Disposition Plan/Expected LOS: transfer to tele bed (watching for PAF w/ CVA) - once BP better controlled and CBG more stable will plan to d/c to  SNF - keep on Team 1 as d/c could be 6/25  Consultants: NA  Procedure/Significant Events: 6/18 CXR; No active cardiopulmonary disease 6/19 CT head without contrast; Age indeterminate focal hypodensity w/i Rt caudate head/anterior limb of the right internal capsule. No intracranial hemorrhage. 6/19 MRI/MRA brain without  contrast; Acute infarct right anterior basal ganglia -Atrophy with moderate to advanced chronic microvascular ischemia.  -Numerous areas chronic micro hemorrhage in brain consistent with hypertension   Culture 6/19 MRSA by PCR negative  Antibiotics: NA  DVT prophylaxis: SCD  Objective: VITAL SIGNS: Blood pressure 185/72, pulse 86, temperature 99 F (37.2 C), temperature source Oral, resp. rate 26, height 5\' 2"  (1.575 m), weight 74.3 kg (163 lb 12.8 oz), SpO2 99.00%.  Intake/Output Summary (Last 24 hours) at 04/29/14 1138 Last data filed at 04/29/14 1034  Gross per 24 hour  Intake    580 ml  Output    975 ml  Net   -395 ml   Exam: General: No acute respiratory distress Lungs: Clear to auscultation bilaterally without wheezes or crackles Cardiovascular: Regular rate and rhythm without murmur gallop or rub normal S1 and S2 Abdomen: Obese, Nontender, nondistended, soft, bowel sounds positive, no rebound, no ascites, no appreciable mass Extremities: No significant cyanosis, clubbing, or edema bilateral lower extremities Neurologic:  mild left hemiparesis (4/5 strength in upper/lower extremity) w/ 5/5 strength in R U&LE, sensation intact throughout  Data Reviewed: Basic Metabolic Panel:  Recent Labs Lab 04/23/14 2258 04/23/14 2305 04/25/14 0315 04/26/14 1633 04/27/14 0200 04/28/14 0245 04/28/14 0947 04/29/14 0243  NA 143 143 146 142 143 143  --   --   K 4.5 4.2 3.2* 3.8 2.8* 3.8  --   --   CL 100 105 105 107 106 108  --   --   CO2 25  --  23 18* 23 21  --   --   GLUCOSE 233* 224* 89 187* 215* 261*  --   --   BUN 31* 36* 26* 10 8 7   --   --   CREATININE 2.37* 2.40* 1.58* 1.08 1.18* 1.13*  --   --   CALCIUM 9.6  --  8.6 8.3* 8.6 8.4  --   --   MG  --   --  1.4* 1.4* 1.4*  --  1.5 1.9  PHOS  --   --  3.2  --   --   --   --   --    Liver Function Tests:  Recent Labs Lab 04/25/14 0315  AST 18  ALT 7  ALKPHOS 52  BILITOT 0.3  PROT 6.8  ALBUMIN 3.2*    CBC:  Recent Labs Lab 04/23/14 2258 04/23/14 2305 04/25/14 0315 04/26/14 1633 04/27/14 0200 04/28/14 0245  WBC 8.0  --  7.0 5.5 6.2 5.9  NEUTROABS 4.9  --   --   --   --   --   HGB 12.7 13.6 11.5* 12.1 12.4 11.8*  HCT 37.0 40.0 33.9* 35.3* 36.5 34.6*  MCV 87.3  --  86.0 86.9 85.9 86.3  PLT 251  --  211 168 234 223   Cardiac Enzymes:  Recent Labs Lab 04/23/14 2258 04/24/14 0915 04/24/14 1657 04/24/14 2249  TROPONINI <0.30 <0.30 <0.30 <0.30   CBG:  Recent Labs Lab 04/28/14 1138 04/28/14 1503 04/28/14 2116 04/28/14 2311 04/29/14 0313  GLUCAP 214* 177* 204* 177* 66*    Recent Results (from the past 240 hour(s))  MRSA PCR SCREENING     Status:  None   Collection Time    04/24/14  8:50 AM      Result Value Ref Range Status   MRSA by PCR NEGATIVE  NEGATIVE Final   Comment:            The GeneXpert MRSA Assay (FDA     approved for NASAL specimens     only), is one component of a     comprehensive MRSA colonization     surveillance program. It is not     intended to diagnose MRSA     infection nor to guide or     monitor treatment for     MRSA infections.    Studies:  Recent x-ray studies have been reviewed in detail by the Attending Physician  Scheduled Meds:  Scheduled Meds: . antiseptic oral rinse  15 mL Mouth Rinse q12n4p  . atorvastatin  40 mg Oral Daily  . chlorhexidine  15 mL Mouth Rinse BID  . clopidogrel  75 mg Oral Q breakfast  . feeding supplement (GLUCERNA SHAKE)  237 mL Oral BID BM  . furosemide  40 mg Oral Daily  . insulin aspart  0-20 Units Subcutaneous 6 times per day  . insulin glargine  18 Units Subcutaneous QHS  . latanoprost  1 drop Both Eyes QHS  . levothyroxine  50 mcg Oral QAC breakfast  . lisinopril  20 mg Oral Daily  . metoprolol succinate  50 mg Oral BID  . olopatadine  1 drop Both Eyes BID  . potassium chloride SA  20 mEq Oral Daily  . sodium chloride  3 mL Intravenous Q12H    Time spent on care of this patient: 35  mins  Lonia BloodJeffrey T. McClung, MD [facility] For Consults/Admissions - Flow Manager - [phone] Office  [phone] Pager [phone]  On-Call/Text Page:      [email or web address]      password [email or web address]  04/29/2014, 11:38 AM   LOS: 6 days

## 2014-04-29 NOTE — Progress Notes (Signed)
Pt arrived to the unit via bed with her husband at her bedside. Pt alert and oriented to person only. BP elevated. Denies any pain. No distress noted. No skin break down. Will cont to monitor.

## 2014-04-30 LAB — BASIC METABOLIC PANEL
BUN: 9 mg/dL (ref 6–23)
CO2: 24 meq/L (ref 19–32)
CREATININE: 1.12 mg/dL — AB (ref 0.50–1.10)
Calcium: 8.7 mg/dL (ref 8.4–10.5)
Chloride: 105 mEq/L (ref 96–112)
GFR calc Af Amer: 55 mL/min — ABNORMAL LOW (ref >90)
GFR calc non Af Amer: 47 mL/min — ABNORMAL LOW (ref >90)
Glucose, Bld: 152 mg/dL — ABNORMAL HIGH (ref 70–99)
Potassium: 4.2 mEq/L (ref 3.7–5.3)
SODIUM: 142 meq/L (ref 137–147)

## 2014-04-30 LAB — GLUCOSE, CAPILLARY
Glucose-Capillary: 160 mg/dL — ABNORMAL HIGH (ref 70–99)
Glucose-Capillary: 167 mg/dL — ABNORMAL HIGH (ref 70–99)
Glucose-Capillary: 94 mg/dL (ref 70–99)
Glucose-Capillary: 203 mg/dL — ABNORMAL HIGH (ref 70–99)

## 2014-04-30 MED ORDER — LISINOPRIL 5 MG PO TABS
15.0000 mg | ORAL_TABLET | Freq: Every day | ORAL | Status: DC
  Administered 2014-05-01 – 2014-05-04 (×4): 15 mg via ORAL
  Filled 2014-04-30 (×5): qty 1

## 2014-04-30 MED ORDER — HYDRALAZINE HCL 10 MG PO TABS
10.0000 mg | ORAL_TABLET | Freq: Once | ORAL | Status: AC
  Administered 2014-04-30: 10 mg via ORAL
  Filled 2014-04-30: qty 1

## 2014-04-30 MED ORDER — HYDRALAZINE HCL 10 MG PO TABS
15.0000 mg | ORAL_TABLET | Freq: Three times a day (TID) | ORAL | Status: DC
  Administered 2014-05-01 – 2014-05-03 (×7): 15 mg via ORAL
  Filled 2014-04-30 (×12): qty 2

## 2014-04-30 MED ORDER — CARVEDILOL 12.5 MG PO TABS
25.0000 mg | ORAL_TABLET | Freq: Two times a day (BID) | ORAL | Status: DC
  Administered 2014-05-01 – 2014-05-04 (×7): 25 mg via ORAL
  Filled 2014-04-30: qty 1
  Filled 2014-04-30 (×2): qty 2
  Filled 2014-04-30 (×7): qty 1

## 2014-04-30 MED ORDER — HYDRALAZINE HCL 25 MG PO TABS
25.0000 mg | ORAL_TABLET | Freq: Three times a day (TID) | ORAL | Status: DC
  Filled 2014-04-30 (×3): qty 1

## 2014-04-30 MED ORDER — CARVEDILOL 12.5 MG PO TABS
12.5000 mg | ORAL_TABLET | Freq: Once | ORAL | Status: AC
  Administered 2014-04-30: 12.5 mg via ORAL
  Filled 2014-04-30: qty 1

## 2014-04-30 NOTE — Progress Notes (Signed)
Spoke to General MillsSchorr NP regarding IV. Will keep IV tonight if pt is d/c'd today. Will continue to monitor. Gilman Schmidtembrina, Janice J

## 2014-04-30 NOTE — Consult Note (Signed)
[facility] Face-to-Face Psychiatry Consult   Reason for Consult:  AMS and capacity evaluation Referring Physician:  [name] is an 73 y.o. female. Total Time spent with patient: 45 minutes  Assessment: AXIS I:  Delirium AXIS II:  Deferred AXIS III:   Past Medical History  Diagnosis Date  . Hypertension   . Diabetes mellitus   . Chronic renal insufficiency   . Hyperlipidemia   . Osteopenia   . CVA (cerebral infarction)    AXIS IV:  other psychosocial or environmental problems, problems related to social environment and problems with primary support group AXIS V:  41-50 serious symptoms  Plan:  Case discussed with Dr. Sherral Hammers Patient does meet criteria for capacity to make her own medical decisions Supportive therapy provided about ongoing stressors. Appreciate psychiatric consultation and follow up tomorrow Please contact [phone] if needs further assistance  Subjective:   [name] [name] is a 73 y.o. female patient admitted with AMS and new onset stroke.  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 Reportedly patient has been staying herself and people around her are helping to go to church. She has been able to function until she suffered with CVA which caused her side weak and has speech difficulties. Patient is unable to participate in this evaluation, she was unable to wake up for OT who was at bed side. Patient primary team and family believe she does not have capacity to care for herself at this time. Patient Niece and sister has MCPOA. Patient will be seen tomorrow for additional evaluation and examination.   Interval history: Patient was appeared sitting in a chair, awake, alert and oriented to time place and person and situation. Patient reported she had a stroke which left her left-sided weakness. Patient husband is at bedside. Patient does not complain any emotional problems due to this  visit. Patient seems like has capacity to make her own medical decisions at this time.  Medical history: [name] [name] is a 73 y.o. female has a past medical history of Hypertension; Diabetes mellitus; Chronic renal insufficiency; Hyperlipidemia; Osteopenia; and CVA (cerebral infarction). She was presented with altered mental status. Her family states that patient was shaky and sweaty and then became somnolent. They were worried about her blood sugar given prior hx of hypoglycemia. She was noted to have BG >200. Patient was brought in to ER and was noted to be encephalopathic with increased somnolence. CT showed intermediate acuity CVA. Patient was admitted on 5/19 with CVA and hypoglycemia. Lab work was noted for increased cr from 1.8 to 2.6. Patient appeared hemoconcentrated With poor PO intake per family. ABG was attempted but patient was not cooperative. Hospitalist was called for admission for encephalopathy and acute on chronic renal failure.   Review of Systems:  Unable to obtain given somnolence  HPI Elements:   Location:  delirium. Quality:  poor. Severity:  acute. Timing:  CVA.  Past Psychiatric History: Past Medical History  Diagnosis Date  . Hypertension   . Diabetes mellitus   . Chronic renal insufficiency   . Hyperlipidemia   . Osteopenia   . CVA (cerebral infarction)     reports that she has been smoking Cigarettes.  She has a 30 pack-year smoking history. She does not have any smokeless tobacco history on file. She reports that she drinks alcohol. She reports that she does not use illicit drugs. Family History  Problem Relation Age of Onset  . Diabetes Mother   . Hypertension Mother      Living Arrangements: Spouse/significant other   Abuse/Neglect [facility]) Physical Abuse: Denies Verbal Abuse: Denies Sexual Abuse: Denies Allergies:   Allergies  Allergen Reactions  . Erythromycin Other (See Comments)    unknown  . Iodine     REACTION: hives  . Penicillins      REACTION: hives    ACT Assessment Complete:  NO Objective: Blood pressure 158/80, pulse 84, temperature 98 F (36.7 C), temperature source Oral, resp. rate 18, height '5\' 2"'  (1.575 m), weight 77.1 kg (169 lb 15.6 oz), SpO2 98.00%.Body mass index is 31.08 kg/(m^2). Results for orders placed during the hospital encounter of 04/23/14 (from the past 72 hour(s))  GLUCOSE, CAPILLARY     Status: Abnormal   Collection Time    04/27/14  9:43 PM      Result Value Ref Range   Glucose-Capillary 258 (*) 70 - 99 mg/dL  BASIC METABOLIC PANEL     Status: Abnormal   Collection Time    04/28/14  2:45 AM      Result Value Ref Range   Sodium 143  137 - 147 mEq/L   Potassium 3.8  3.7 - 5.3 mEq/L   Comment: DELTA CHECK NOTED     NO VISIBLE HEMOLYSIS   Chloride 108  96 - 112 mEq/L   CO2 21  19 - 32 mEq/L   Glucose, Bld 261 (*) 70 - 99 mg/dL   BUN 7  6 - 23 mg/dL   Creatinine, Ser 1.13 (*) 0.50 - 1.10 mg/dL   Calcium 8.4  8.4 - 10.5 mg/dL   GFR calc non Af Amer 47 (*) >90 mL/min   GFR calc Af Amer 54 (*) >90 mL/min   Comment: (NOTE)     The eGFR has been calculated using the CKD EPI equation.     This calculation has not been validated in all clinical situations.     eGFR's persistently <90 mL/min signify possible Chronic Kidney     Disease.  CBC     Status: Abnormal   Collection Time    04/28/14  2:45 AM      Result Value Ref Range   WBC 5.9  4.0 - 10.5 K/uL   RBC 4.01  3.87 - 5.11 MIL/uL   Hemoglobin 11.8 (*) 12.0 - 15.0 g/dL   HCT 34.6 (*) 36.0 - 46.0 %   MCV 86.3  78.0 - 100.0 fL   MCH 29.4  26.0 - 34.0 pg   MCHC 34.1  30.0 - 36.0 g/dL   RDW 12.9  11.5 - 15.5 %   Platelets 223  150 - 400 K/uL  GLUCOSE, CAPILLARY     Status: Abnormal   Collection Time    04/28/14  7:18 AM      Result Value Ref Range   Glucose-Capillary 228 (*) 70 - 99 mg/dL   Comment 1 Documented in Chart     Comment 2 Notify RN    MAGNESIUM     Status: None   Collection Time    04/28/14  9:47 AM      Result Value  Ref Range   Magnesium 1.5  1.5 - 2.5 mg/dL  GLUCOSE, CAPILLARY     Status: Abnormal   Collection Time    04/28/14 11:38 AM      Result Value Ref Range   Glucose-Capillary 214 (*) 70 - 99 mg/dL   Comment 1 Documented  in Chart     Comment 2 Notify RN    GLUCOSE, CAPILLARY     Status: Abnormal   Collection Time    04/28/14  3:03 PM      Result Value Ref Range   Glucose-Capillary 177 (*) 70 - 99 mg/dL   Comment 1 Documented in Chart     Comment 2 Notify RN    GLUCOSE, CAPILLARY     Status: Abnormal   Collection Time    04/28/14  9:16 PM      Result Value Ref Range   Glucose-Capillary 204 (*) 70 - 99 mg/dL  GLUCOSE, CAPILLARY     Status: Abnormal   Collection Time    04/28/14 11:11 PM      Result Value Ref Range   Glucose-Capillary 177 (*) 70 - 99 mg/dL   Comment 1 Documented in Chart     Comment 2 Notify RN    MAGNESIUM     Status: None   Collection Time    04/29/14  2:43 AM      Result Value Ref Range   Magnesium 1.9  1.5 - 2.5 mg/dL  GLUCOSE, CAPILLARY     Status: Abnormal   Collection Time    04/29/14  3:13 AM      Result Value Ref Range   Glucose-Capillary 66 (*) 70 - 99 mg/dL   Comment 1 Documented in Chart     Comment 2 Notify RN    GLUCOSE, CAPILLARY     Status: None   Collection Time    04/29/14  8:21 AM      Result Value Ref Range   Glucose-Capillary 83  70 - 99 mg/dL   Comment 1 Documented in Chart     Comment 2 Notify RN    GLUCOSE, CAPILLARY     Status: Abnormal   Collection Time    04/29/14 12:10 PM      Result Value Ref Range   Glucose-Capillary 165 (*) 70 - 99 mg/dL  GLUCOSE, CAPILLARY     Status: Abnormal   Collection Time    04/29/14  5:39 PM      Result Value Ref Range   Glucose-Capillary 235 (*) 70 - 99 mg/dL  GLUCOSE, CAPILLARY     Status: Abnormal   Collection Time    04/29/14  8:21 PM      Result Value Ref Range   Glucose-Capillary 206 (*) 70 - 99 mg/dL  BASIC METABOLIC PANEL     Status: Abnormal   Collection Time    04/30/14 12:00 AM       Result Value Ref Range   Sodium 142  137 - 147 mEq/L   Potassium 4.2  3.7 - 5.3 mEq/L   Chloride 105  96 - 112 mEq/L   CO2 24  19 - 32 mEq/L   Glucose, Bld 152 (*) 70 - 99 mg/dL   BUN 9  6 - 23 mg/dL   Creatinine, Ser 1.12 (*) 0.50 - 1.10 mg/dL   Calcium 8.7  8.4 - 10.5 mg/dL   GFR calc non Af Amer 47 (*) >90 mL/min   GFR calc Af Amer 55 (*) >90 mL/min   Comment: (NOTE)     The eGFR has been calculated using the CKD EPI equation.     This calculation has not been validated in all clinical situations.     eGFR's persistently <90 mL/min signify possible Chronic Kidney     Disease.  GLUCOSE, CAPILLARY  Status: Abnormal   Collection Time    04/30/14  7:52 AM      Result Value Ref Range   Glucose-Capillary 160 (*) 70 - 99 mg/dL  GLUCOSE, CAPILLARY     Status: Abnormal   Collection Time    04/30/14 11:31 AM      Result Value Ref Range   Glucose-Capillary 167 (*) 70 - 99 mg/dL   Labs are reviewed and are pertinent for .  Current Facility-Administered Medications  Medication Dose Route Frequency Richrd Kuzniar Last Rate Last Dose  . acetaminophen (TYLENOL) tablet 650 mg  650 mg Oral Q6H PRN Toy Baker, MD       Or  . acetaminophen (TYLENOL) suppository 650 mg  650 mg Rectal Q6H PRN Toy Baker, MD      . antiseptic oral rinse (BIOTENE) solution 15 mL  15 mL Mouth Rinse q12n4p Allie Bossier, MD   15 mL at 04/30/14 1429  . atorvastatin (LIPITOR) tablet 40 mg  40 mg Oral Daily Cherene Altes, MD   40 mg at 04/30/14 1421  . carvedilol (COREG) tablet 25 mg  25 mg Oral BID WC Allie Bossier, MD      . chlorhexidine (PERIDEX) 0.12 % solution 15 mL  15 mL Mouth Rinse BID Allie Bossier, MD   15 mL at 04/30/14 1028  . clopidogrel (PLAVIX) tablet 75 mg  75 mg Oral Q breakfast Cherene Altes, MD   75 mg at 04/30/14 1026  . feeding supplement (GLUCERNA SHAKE) (GLUCERNA SHAKE) liquid 237 mL  237 mL Oral BID BM Dagmar Hait, RD   237 mL at 04/30/14 1432  . furosemide  (LASIX) tablet 40 mg  40 mg Oral Daily Cherene Altes, MD   40 mg at 04/30/14 0600  . hydrALAZINE (APRESOLINE) injection 10 mg  10 mg Intravenous Q4H PRN Allie Bossier, MD      . hydrALAZINE (APRESOLINE) tablet 25 mg  25 mg Oral 3 times per day Allie Bossier, MD      . insulin aspart (novoLOG) injection 0-20 Units  0-20 Units Subcutaneous TID WC Cherene Altes, MD   4 Units at 04/30/14 1423  . insulin glargine (LANTUS) injection 14 Units  14 Units Subcutaneous QHS Cherene Altes, MD   14 Units at 04/29/14 2239  . latanoprost (XALATAN) 0.005 % ophthalmic solution 1 drop  1 drop Both Eyes QHS Toy Baker, MD   1 drop at 04/29/14 2216  . levothyroxine (SYNTHROID, LEVOTHROID) tablet 50 mcg  50 mcg Oral QAC breakfast Cherene Altes, MD   50 mcg at 04/30/14 1027  . lisinopril (PRINIVIL,ZESTRIL) tablet 20 mg  20 mg Oral Daily Cherene Altes, MD   20 mg at 04/30/14 0559  . olopatadine (PATANOL) 0.1 % ophthalmic solution 1 drop  1 drop Both Eyes BID Toy Baker, MD   1 drop at 04/30/14 1422  . ondansetron (ZOFRAN) tablet 4 mg  4 mg Oral Q6H PRN Toy Baker, MD       Or  . ondansetron (ZOFRAN) injection 4 mg  4 mg Intravenous Q6H PRN Toy Baker, MD      . potassium chloride SA (K-DUR,KLOR-CON) CR tablet 20 mEq  20 mEq Oral Daily Cherene Altes, MD   20 mEq at 04/29/14 1100  . sodium chloride 0.9 % injection 3 mL  3 mL Intravenous Q12H Toy Baker, MD   3 mL at 04/28/14 2130    Psychiatric Specialty Exam: Physical  Exam  ROS  Blood pressure 158/80, pulse 84, temperature 98 F (36.7 C), temperature source Oral, resp. rate 18, height '5\' 2"'  (1.575 m), weight 77.1 kg (169 lb 15.6 oz), SpO2 98.00%.Body mass index is 31.08 kg/(m^2).  General Appearance: Guarded  Eye Contact::  could not wake up  Speech:  NA  Volume:  NA  Mood:  NA  Affect:  NA  Thought Process:  NA  Orientation:  NA  Thought Content:  NA  Suicidal Thoughts:  No  Homicidal Thoughts:   No  Memory:  NA  Judgement:  NA  Insight:  NA  Psychomotor Activity:  NA  Concentration:  NA  Recall:  NA  Fund of Knowledge:NA  Language: NA  Akathisia:  NA  Handed:  Right  AIMS (if indicated):     Assets:  Communication Skills Desire for Improvement Housing Leisure Time Resilience Social Support Talents/Skills  Sleep:      Musculoskeletal: Strength & Muscle Tone: decreased Gait & Station: unable to stand Patient leans: N/A  Treatment Plan Summary: Daily contact with patient to assess and evaluate symptoms and progress in treatment Medication management  JONNALAGADDA,JANARDHAHA R. 04/30/2014 4:12 PM

## 2014-04-30 NOTE — Evaluation (Signed)
Clinical/Bedside Swallow Evaluation Patient Details  Name: [name] [name] MRN: [id number] Date of Birth: [birth year]  Today's Date: 04/30/2014 Time: 1240-1255 SLP Time Calculation (min): 15 min  Past Medical History:  Past Medical History  Diagnosis Date  . Hypertension   . Diabetes mellitus   . Chronic renal insufficiency   . Hyperlipidemia   . Osteopenia   . CVA (cerebral infarction)    Past Surgical History:  Past Surgical History  Procedure Laterality Date  . Eye surgery    . Loop recorder implant  03-24-2014    MDT LinQ implanted by Dr Johney FrameAllred for cryptogenic stroke  . Tee without cardioversion N/A 03/23/2014    Procedure: TRANSESOPHAGEAL ECHOCARDIOGRAM (TEE);  Surgeon: Lars MassonKatarina H Nelson, MD;  [facility];  Service: Cardiovascular;  Laterality: N/A;   HPI:  Patient is a 73 year old female, admitted on 6/18  with CVA and hypoglycemia. CT: age indeterminate focal hypodensity within the right caudate head/anterior limb of the right internal capsule; MRI showed acute right anterior basal ganglia CVA. CT negative for pna.  Pt was seen by SLP last month (5/15), showed signs of aspriation with thin straw only, rec Dys 3/thin cup. Evaluated on this admission on 6/19 - followed by SLP, who recommended pt remain on a pureed diet with thin liquids.  There were no s/s of aspiration; swallow function was at baseline, hence pt was D/Cd from SLP services on 6/23.  New swallow orders received 6/25 secondary to pm RN noticing frequent coughing during meal.     Assessment / Plan / Recommendation Clinical Impression  Pt's swallow function appears consistent with documentation from SLP session on 6/23.  Pt continues to present with oral delays, prolonged oral preparation, but no s/s of aspiration with thin liquids nor purees.  No focal CN deficits. Per 6/23 notes, pt's cough is frequent but does not appear to be associated with PO intake; she did not cough during today's evaluation.  Lungs  remain clear. Recommend continuing purees, thin liquids, whole meds with puree.  No further SLP warranted.     Aspiration Risk  Mild    Diet Recommendation Thin liquid;Dysphagia 1 (Puree)   Liquid Administration via: Cup Medication Administration: Whole meds with puree Supervision: Patient able to self feed;Intermittent supervision to cue for compensatory strategies Compensations: Slow rate;Check for pocketing Postural Changes and/or Swallow Maneuvers: Seated upright 90 degrees;Upright 30-60 min after meal    Other  Recommendations Oral Care Recommendations: Oral care BID       Swallow Study Prior Functional Status       General HPI: Patient is a 73 year old female, admitted on 6/18  with CVA and hypoglycemia. CT: age indeterminate focal hypodensity within the right caudate head/anterior limb of the right internal capsule; MRI showed acute right anterior basal ganglia CVA. CT negative for pna.  Pt was seen by SLP last month (5/15), showed signs of aspriation with thin straw only, rec Dys 3/thin cup. Evaluated on this admission on 6/19 - followed by SLP, who recommended pt remain on a pureed diet with thin liquids.  There were no s/s of aspiration; swallow function was at baseline, hence pt was D/Cd from SLP services on 6/23.  New swallow orders received 6/25 secondary to pm RN noticing frequent coughing during meal.   Type of Study: Bedside swallow evaluation Previous Swallow Assessment: BSE 5/16 and 6/19 Diet Prior to this Study: NPO Temperature Spikes Noted: No Respiratory Status: Room air History of Recent Intubation: No Behavior/Cognition:  Alert;Cooperative Oral Cavity - Dentition: Edentulous Self-Feeding Abilities: Needs assist Patient Positioning: Upright in bed Baseline Vocal Quality: Hoarse Volitional Cough: Strong Volitional Swallow: Able to elicit    Oral/Motor/Sensory Function Overall Oral Motor/Sensory Function: Appears within functional limits for tasks assessed    Ice Chips Ice chips: Not tested   Thin Liquid Thin Liquid: Within functional limits Presentation: Cup    Nectar Thick Nectar Thick Liquid: Not tested   Honey Thick Honey Thick Liquid: Not tested   Puree Puree: Impaired Presentation: Spoon Oral Phase Impairments: Impaired mastication Oral Phase Functional Implications: Prolonged oral transit   Solid  [name] [name] [name] [name], [place] CCC/SLP Pager [phone]     Solid: Not tested       [name] MountsCouture, [name] [name] 04/30/2014,1:08 PM

## 2014-04-30 NOTE — Progress Notes (Addendum)
[place] TEAM 1 - Stepdown/ICU TEAM Progress Note  Erin FullingBetty J Wittler ZOX:[id number] DOB: Apr 03, 1941 DOA: 04/23/2014 PCP: Alva GarnetSHELTON,KIMBERLY R., MD  Admit HPI / Brief Narrative: 73 year old  BF PMHx HTN, HLD, diabetes type 2 with neuropathy, hypothyroidism  sent to the ER due to altered mentation. Family stated that patient was shaky and sweaty and then became somnolent. They were worried about her blood sugar given prior hx of hypoglycemia. She was noted to have BG >200.   Patient was brought in to ER and was noted to be encephalopathic with increased somnolence. CT showed age indeterminate CVA with MRI recommended. Patient was recently admitted on 5/19 with CVA and hypoglycemia. Lab work was c/w acute renal failure with elevation in cr from 1.8 to 2.6. Patient appeared hemoconcentrated with poor PO intake per family. ABG was attempted but patient was not cooperative.   HPI/Subjective: 6/25 A./O. x2 (does not know where, why she is here). Patient does not appear to have Delirium vs Delusions, today. Follows all commands.     Assessment/Plan: Active Problems:   DM type 2, uncontrolled, with renal complications -not in DKA or HHNS but hyperglycemic -after admission bedside RN received message from social services reporting pt had not received insulin for several days apparently because she could not afford this medicine- also did not have syringes -last admit she was instructed to stop Amaryl but appears she did not -6/19 hemoglobin A1c= 8.9 -Lipid panel within ADA guidelines -Continue Lantus to 14 units QHS -Continue resistant SSI -Discharge 6/26 following psychiatry's final recommendation    Metabolic encephalopathy/ History of CVA/acute CVA  -MRI showing acute stroke see results below  -Secondary to ARF, DH, and acute CVA as etiology -UA unremarkable -SLP plan ; continue Dysphagia 1 (puree);Nectar-thick liquid Liquids provided via: Cup;No straw -PT /OT; recommends SNF;  supervision/assistance 24-hour; [facility] -6/26 Psychiatry reconsulted awaiting final recommendations, then patient can be discharged [facility]     Dehydration -multifactorial due to diuretics and hyperglycemia-at last dc was supposed to stop Lasix but appears as if she did not based on med rec -Patient appears to be euvolemic at this time     Patient non adherence -may not be able to afford medications -CM consulted; awaiting recommendations NOTE; patient currently does not appear to be competent, and unsure that husband has the mental capacity to make complex medical decisions. [facility]    Hypertension urgency -Addendum; at 1714 on 6/25 patient's SBP= 103, hold next dose of hydralazine and Coreg. In the a.m. restart  Hydralazine 15 mgTID -On 6/26 restart lisinopril at 15 mg daily (secondary to hypotensive episode) -On 6/26 Continue Coreg 25 mg BID     Acute on chronic renal failure/CKD (chronic kidney disease), stage III -suspect combo DH and offending meds (NSAIDs and diuretics,  ACE I)  -pt had been instructed to stop meloxicam and naproxen at discharge last admit but med rec shows her to still be taking -6/25 Cr 1.12  Peripheral vascular disease, unspecified  Hypothyroidism -cont Synthroid 25 mcg daily    DVT prophylaxis: SCDs Code Status: Full Family Communication: Husband at bedside Disposition Plan/Expected LOS: SNF vs CIR vs home health   Consultants: NA    Procedure/Significant Events: 6/18 CXR; No active cardiopulmonary disease 6/19 CT head without contrast; Age indeterminate focal hypodensity w/i Rt caudate head/anterior limb of the right internal capsule. No intracranial hemorrhage. 6/19 MRI/MRA brain without  contrast; Acute infarct right anterior basal ganglia -Atrophy with moderate to advanced chronic microvascular ischemia.  -Numerous areas chronic micro hemorrhage in brain  consistent with hypertension      Culture 6/19 MRSA by PCR negative   Antibiotics: NA   DVT prophylaxis: SCD   Devices NA   LINES / TUBES:  6/18 20ga left wrist    Continuous Infusions:    Objective: VITAL SIGNS: Temp: 98.4 F (36.9 C) (06/25 0959) Temp src: Oral (06/25 0959) BP: 137/71 mmHg (06/25 1130) Pulse Rate: 84 (06/25 1130) SPO2; 97% on room air FIO2:   Intake/Output Summary (Last 24 hours) at 04/30/14 1406 Last data filed at 04/30/14 0900  Gross per 24 hour  Intake      0 ml  Output    200 ml  Net   -200 ml     Exam: General: A./O. x2 (does not know where, why she is here), patient not having delirium vs delusions, NAD, No acute respiratory distress Lungs: Clear to auscultation bilaterally without wheezes or crackles Cardiovascular: Regular rate and rhythm without murmur gallop or rub normal S1 and S2 Abdomen: Obese, Nontender, nondistended, soft, bowel sounds positive, no rebound, no ascites, no appreciable mass Extremities: No significant cyanosis, clubbing, or edema bilateral lower extremities Neurologic; pupils equal round reactive to light and accommodation, cranial nerves II through XII intact, tongue/uvula midline, mild left hemiparesis 4/5 strength in upper/lower extremity, sensation intact throughout, patient able to ambulate with one-person assist.  Data Reviewed: Basic Metabolic Panel:  Recent Labs Lab 04/25/14 0315 04/26/14 1633 04/27/14 0200 04/28/14 0245 04/28/14 0947 04/29/14 0243 04/30/14  NA 146 142 143 143  --   --  142  K 3.2* 3.8 2.8* 3.8  --   --  4.2  CL 105 107 106 108  --   --  105  CO2 23 18* 23 21  --   --  24  GLUCOSE 89 187* 215* 261*  --   --  152*  BUN 26* 10 8 7   --   --  9  CREATININE 1.58* 1.08 1.18* 1.13*  --   --  1.12*  CALCIUM 8.6 8.3* 8.6 8.4  --   --  8.7  MG 1.4* 1.4* 1.4*  --  1.5 1.9  --   PHOS 3.2  --   --   --   --   --   --    Liver Function Tests:  Recent Labs Lab 04/25/14 0315    AST 18  ALT 7  ALKPHOS 52  BILITOT 0.3  PROT 6.8  ALBUMIN 3.2*   No results found for this basename: LIPASE, AMYLASE,  in the last 168 hours No results found for this basename: AMMONIA,  in the last 168 hours CBC:  Recent Labs Lab 04/23/14 2258 04/23/14 2305 04/25/14 0315 04/26/14 1633 04/27/14 0200 04/28/14 0245  WBC 8.0  --  7.0 5.5 6.2 5.9  NEUTROABS 4.9  --   --   --   --   --   HGB 12.7 13.6 11.5* 12.1 12.4 11.8*  HCT 37.0 40.0 33.9* 35.3* 36.5 34.6*  MCV 87.3  --  86.0 86.9 85.9 86.3  PLT 251  --  211 168 234 223   Cardiac Enzymes:  Recent Labs Lab 04/23/14 2258 04/24/14 0915 04/24/14 1657 04/24/14 2249  TROPONINI <0.30 <0.30 <0.30 <0.30   BNP (last 3 results) No results found for this basename: PROBNP,  in the last 8760 hours CBG:  Recent Labs  Lab 04/29/14 1210 04/29/14 1739 04/29/14 2021 04/30/14 0752 04/30/14 1131  GLUCAP 165* 235* 206* 160* 167*    Recent Results (from the past 240 hour(s))  MRSA PCR SCREENING     Status: None   Collection Time    04/24/14  8:50 AM      Result Value Ref Range Status   MRSA by PCR NEGATIVE  NEGATIVE Final   Comment:            The GeneXpert MRSA Assay (FDA     approved for NASAL specimens     only), is one component of a     comprehensive MRSA colonization     surveillance program. It is not     intended to diagnose MRSA     infection nor to guide or     monitor treatment for     MRSA infections.     Studies:  Recent x-ray studies have been reviewed in detail by the Attending Physician  Scheduled Meds:  Scheduled Meds: . antiseptic oral rinse  15 mL Mouth Rinse q12n4p  . atorvastatin  40 mg Oral Daily  . carvedilol  12.5 mg Oral Once  . carvedilol  25 mg Oral BID WC  . chlorhexidine  15 mL Mouth Rinse BID  . clopidogrel  75 mg Oral Q breakfast  . feeding supplement (GLUCERNA SHAKE)  237 mL Oral BID BM  . furosemide  40 mg Oral Daily  . hydrALAZINE  10 mg Oral Once  . hydrALAZINE  25 mg  Oral 3 times per day  . insulin aspart  0-20 Units Subcutaneous TID WC  . insulin glargine  14 Units Subcutaneous QHS  . latanoprost  1 drop Both Eyes QHS  . levothyroxine  50 mcg Oral QAC breakfast  . lisinopril  20 mg Oral Daily  . olopatadine  1 drop Both Eyes BID  . potassium chloride SA  20 mEq Oral Daily  . sodium chloride  3 mL Intravenous Q12H    Time spent on care of this patient: 40 mins   Drema DallasWOODS, Yanel Dombrosky, J , MD   [facility] Office  [phone] Pager [phone]  On-Call/Text Page:      [email or web address]      password [email or web address]  If 7PM-7AM, please contact night-coverage [email or web address] Password [email or web address] 04/30/2014, 2:06 PM   LOS: 7 days

## 2014-04-30 NOTE — Progress Notes (Signed)
NUTRITION FOLLOW-UP  DOCUMENTATION CODES Per approved criteria  -Obesity Unspecified   INTERVENTION: Continue Glucerna Shake po BID, each supplement provides 220 kcal and 10 grams of protein. RD will continue to follow for nutrition care plan.  NUTRITION DIAGNOSIS: Inadequate oral intake related to CVA as evidenced by reported intake less than estimated needs and suspected weight loss. Ongoing.  Goal: Pt to meet >/= 90% of their estimated nutrition needs.   Monitor:  Wt, po intake, acceptance of supplements, nutrition-related labs  ASSESSMENT: 73 year old BF PMHx HTN, HLD, diabetes type 2 with neuropathy, hypothyroidism sent to the ER due to altered mentation. Family stated that patient was shaky and sweaty and then became somnolent. They were worried about her blood sugar given prior hx of hypoglycemia. She was noted to have BG >200. Patient was brought in to ER and was noted to be encephalopathic with increased somnolence. CT showed age indeterminate CVA with MRI recommended. Patient was recently admitted on 5/19 with CVA and hypoglycemia. Lab work was c/w acute renal failure with elevation in cr from 1.8 to 2.6. Patient appeared hemoconcentrated with poor PO intake per family.   BSE completed by SLP on 6/25 - recommending Dysphagia 1 diet with thin liquids. Meal intake is poor, pt is eating 5-25%, ordered for Glucerna Shake. Pt unable to answer my questions at this time.  Plan is to discharge tomorrow following psychiatry's final recommendations.  Potassium WNL CBG: 160 - 206 Magnesium WNL  Height: Ht Readings from Last 1 Encounters:  04/27/14 5\' 2"  (1.575 m)    Weight: Wt Readings from Last 1 Encounters:  04/30/14 169 lb 15.6 oz (77.1 kg)   BMI:  Body mass index is 31.08 kg/(m^2). Obese Class I  Estimated Nutritional Needs: Kcal: 1850-2050 Protein: 95-105 g Fluid: 1.9-2.1 L/day  Skin: Intact  Diet Order: Dysphagia 1; thin liquids   Intake/Output Summary (Last 24  hours) at 04/30/14 1459 Last data filed at 04/30/14 0900  Gross per 24 hour  Intake      0 ml  Output    200 ml  Net   -200 ml    Last BM: 6/23  Labs:   Recent Labs Lab 04/25/14 0315  04/27/14 0200 04/28/14 0245 04/28/14 0947 04/29/14 0243 04/30/14  NA 146  < > 143 143  --   --  142  K 3.2*  < > 2.8* 3.8  --   --  4.2  CL 105  < > 106 108  --   --  105  CO2 23  < > 23 21  --   --  24  BUN 26*  < > 8 7  --   --  9  CREATININE 1.58*  < > 1.18* 1.13*  --   --  1.12*  CALCIUM 8.6  < > 8.6 8.4  --   --  8.7  MG 1.4*  < > 1.4*  --  1.5 1.9  --   PHOS 3.2  --   --   --   --   --   --   GLUCOSE 89  < > 215* 261*  --   --  152*  < > = values in this interval not displayed.  CBG (last 3)   Recent Labs  04/29/14 2021 04/30/14 0752 04/30/14 1131  GLUCAP 206* 160* 167*    Scheduled Meds: . antiseptic oral rinse  15 mL Mouth Rinse q12n4p  . atorvastatin  40 mg Oral Daily  . carvedilol  25 mg Oral BID WC  . chlorhexidine  15 mL Mouth Rinse BID  . clopidogrel  75 mg Oral Q breakfast  . feeding supplement (GLUCERNA SHAKE)  237 mL Oral BID BM  . furosemide  40 mg Oral Daily  . hydrALAZINE  25 mg Oral 3 times per day  . insulin aspart  0-20 Units Subcutaneous TID WC  . insulin glargine  14 Units Subcutaneous QHS  . latanoprost  1 drop Both Eyes QHS  . levothyroxine  50 mcg Oral QAC breakfast  . lisinopril  20 mg Oral Daily  . olopatadine  1 drop Both Eyes BID  . potassium chloride SA  20 mEq Oral Daily  . sodium chloride  3 mL Intravenous Q12H    Continuous Infusions:    Jarold MottoSamantha Worley MS, RD, LDN Inpatient Registered Dietitian Pager: [phone] After-hours pager: [phone]

## 2014-05-01 ENCOUNTER — Inpatient Hospital Stay (HOSPITAL_COMMUNITY): Payer: Medicare HMO

## 2014-05-01 LAB — BLOOD GAS, ARTERIAL
ACID-BASE EXCESS: 2.3 mmol/L — AB (ref 0.0–2.0)
BICARBONATE: 27.5 meq/L — AB (ref 20.0–24.0)
Drawn by: 31297
FIO2: 1 %
PCO2 ART: 52.1 mmHg — AB (ref 35.0–45.0)
PH ART: 7.343 — AB (ref 7.350–7.450)
Patient temperature: 98.6
TCO2: 29.1 mmol/L (ref 0–100)
pO2, Arterial: 153 mmHg — ABNORMAL HIGH (ref 80.0–100.0)

## 2014-05-01 LAB — GLUCOSE, CAPILLARY
Glucose-Capillary: 180 mg/dL — ABNORMAL HIGH (ref 70–99)
Glucose-Capillary: 105 mg/dL — ABNORMAL HIGH (ref 70–99)
Glucose-Capillary: 133 mg/dL — ABNORMAL HIGH (ref 70–99)
Glucose-Capillary: 140 mg/dL — ABNORMAL HIGH (ref 70–99)

## 2014-05-01 LAB — TROPONIN I: Troponin I: <0.3 ng/mL (ref <0.30)

## 2014-05-01 LAB — MRSA PCR SCREENING: MRSA by PCR: NEGATIVE

## 2014-05-01 LAB — LACTIC ACID, PLASMA: Lactic Acid, Venous: 1.1 mmol/L (ref 0.5–2.2)

## 2014-05-01 MED ORDER — CARVEDILOL 25 MG PO TABS: 25.0000 mg | ORAL_TABLET | Freq: Two times a day (BID) | ORAL | Status: DC

## 2014-05-01 MED ORDER — GUAIFENESIN-DM 100-10 MG/5ML PO SYRP: 5.0000 mL | ORAL_SOLUTION | ORAL | Status: DC | PRN

## 2014-05-01 MED ORDER — GUAIFENESIN-DM 100-10 MG/5ML PO SYRP
5.0000 mL | ORAL_SOLUTION | ORAL | Status: DC | PRN
  Administered 2014-05-04: 5 mL via ORAL
  Filled 2014-05-01 (×2): qty 5

## 2014-05-01 MED ORDER — HYDRALAZINE HCL 50 MG PO TABS: 15.0000 mg | ORAL_TABLET | Freq: Three times a day (TID) | ORAL | Status: DC

## 2014-05-01 MED FILL — Medication: Qty: 1 | Status: AC

## 2014-05-01 NOTE — Consult Note (Signed)
[facility] Face-to-Face Psychiatry Consult   Reason for Consult:  AMS and capacity evaluation Referring Physician:  [name] is an 73 y.o. female. Total Time spent with patient: 45 minutes  Assessment: AXIS I:  Delirium AXIS II:  Deferred AXIS III:   Past Medical History  Diagnosis Date  . Hypertension   . Diabetes mellitus   . Chronic renal insufficiency   . Hyperlipidemia   . Osteopenia   . CVA (cerebral infarction)    AXIS IV:  other psychosocial or environmental problems, problems related to social environment and problems with primary support group AXIS V:  41-50 serious symptoms  Plan:  Case discussed with Dr. Sherral Hammers Patient does meet criteria for capacity to make her own medical decisions Supportive therapy provided about ongoing stressors. Appreciate psychiatric consultation and sign off at this time Please contact [phone] if needs further assistance  Subjective:   [name] [name] is a 73 y.o. female patient admitted with AMS and new onset stroke.  HPI:  Patient is seen, case discussed with staff RN and patient Alanson Puls, family friend from [place] and pastor from her church. Reportedly patient has been staying herself and people around her are helping to go to church. She has been able to function until she suffered with CVA which caused her side weak and has speech difficulties. Patient is unable to participate in this evaluation, she was unable to wake up for OT who was at bed side. Patient primary team and family believe she does not have capacity to care for herself at this time. Patient Niece and sister has MCPOA. Patient will be seen tomorrow for additional evaluation and examination.   Interval history: Patient has no reported behavioral or emotional problems. Patient has no safety concerns contract for safety. Patient has family who is supportive to her. Patient is awake, alert and oriented to time place and person and situation. Patient had a stroke which left her  left-sided weakness. Patient husband is at bedside. Patient seems like has capacity to make her own medical decisions at this time.  Medical history: [name] [name] is a 73 y.o. female has a past medical history of Hypertension; Diabetes mellitus; Chronic renal insufficiency; Hyperlipidemia; Osteopenia; and CVA (cerebral infarction). She was presented with altered mental status. Her family states that patient was shaky and sweaty and then became somnolent. They were worried about her blood sugar given prior hx of hypoglycemia. She was noted to have BG >200. Patient was brought in to ER and was noted to be encephalopathic with increased somnolence. CT showed intermediate acuity CVA. Patient was admitted on 5/19 with CVA and hypoglycemia. Lab work was noted for increased cr from 1.8 to 2.6. Patient appeared hemoconcentrated With poor PO intake per family. ABG was attempted but patient was not cooperative. Hospitalist was called for admission for encephalopathy and acute on chronic renal failure.   Review of Systems:  Unable to obtain given somnolence  HPI Elements:   Location:  delirium. Quality:  poor. Severity:  acute. Timing:  CVA.  Past Psychiatric History: Past Medical History  Diagnosis Date  . Hypertension   . Diabetes mellitus   . Chronic renal insufficiency   . Hyperlipidemia   . Osteopenia   . CVA (cerebral infarction)     reports that she has been smoking Cigarettes.  She has a 30 pack-year smoking history. She does not have any smokeless tobacco history on file. She reports that she drinks alcohol. She reports that she  does not use illicit drugs. Family History  Problem Relation Age of Onset  . Diabetes Mother   . Hypertension Mother      Living Arrangements: Spouse/significant other   Abuse/Neglect [facility]) Physical Abuse: Denies Verbal Abuse: Denies Sexual Abuse: Denies Allergies:   Allergies  Allergen Reactions  . Erythromycin Other (See Comments)    unknown  .  Iodine     REACTION: hives  . Penicillins     REACTION: hives    ACT Assessment Complete:  NO Objective: Blood pressure 185/77, pulse 79, temperature 98.5 F (36.9 C), temperature source Oral, resp. rate 28, height _0  (1.575 m), weight 73.3 kg (161 lb 9.6 oz), SpO2 96.00%.Body mass index is 29.55 kg/(m^2). Results for orders placed during the hospital encounter of 04/23/14 (from the past 72 hour(s))  GLUCOSE, CAPILLARY     Status: Abnormal   Collection Time    04/28/14  3:03 PM      Result Value Ref Range   Glucose-Capillary 177 (*) 70 - 99 mg/dL   Comment 1 Documented in Chart     Comment 2 Notify RN    GLUCOSE, CAPILLARY     Status: Abnormal   Collection Time    04/28/14  9:16 PM      Result Value Ref Range   Glucose-Capillary 204 (*) 70 - 99 mg/dL  GLUCOSE, CAPILLARY     Status: Abnormal   Collection Time    04/28/14 11:11 PM      Result Value Ref Range   Glucose-Capillary 177 (*) 70 - 99 mg/dL   Comment 1 Documented in Chart     Comment 2 Notify RN    MAGNESIUM     Status: None   Collection Time    04/29/14  2:43 AM      Result Value Ref Range   Magnesium 1.9  1.5 - 2.5 mg/dL  GLUCOSE, CAPILLARY     Status: Abnormal   Collection Time    04/29/14  3:13 AM      Result Value Ref Range   Glucose-Capillary 66 (*) 70 - 99 mg/dL   Comment 1 Documented in Chart     Comment 2 Notify RN    GLUCOSE, CAPILLARY     Status: None   Collection Time    04/29/14  8:21 AM      Result Value Ref Range   Glucose-Capillary 83  70 - 99 mg/dL   Comment 1 Documented in Chart     Comment 2 Notify RN    GLUCOSE, CAPILLARY     Status: Abnormal   Collection Time    04/29/14 12:10 PM      Result Value Ref Range   Glucose-Capillary 165 (*) 70 - 99 mg/dL  GLUCOSE, CAPILLARY     Status: Abnormal   Collection Time    04/29/14  5:39 PM      Result Value Ref Range   Glucose-Capillary 235 (*) 70 - 99 mg/dL  GLUCOSE, CAPILLARY     Status: Abnormal   Collection Time    04/29/14  8:21 PM       Result Value Ref Range   Glucose-Capillary 206 (*) 70 - 99 mg/dL  BASIC METABOLIC PANEL     Status: Abnormal   Collection Time    04/30/14 12:00 AM      Result Value Ref Range   Sodium 142  137 - 147 mEq/L   Potassium 4.2  3.7 - 5.3 mEq/L   Chloride 105  96 -  112 mEq/L   CO2 24  19 - 32 mEq/L   Glucose, Bld 152 (*) 70 - 99 mg/dL   BUN 9  6 - 23 mg/dL   Creatinine, Ser 1.12 (*) 0.50 - 1.10 mg/dL   Calcium 8.7  8.4 - 10.5 mg/dL   GFR calc non Af Amer 47 (*) >90 mL/min   GFR calc Af Amer 55 (*) >90 mL/min   Comment: (NOTE)     The eGFR has been calculated using the CKD EPI equation.     This calculation has not been validated in all clinical situations.     eGFR's persistently <90 mL/min signify possible Chronic Kidney     Disease.  GLUCOSE, CAPILLARY     Status: Abnormal   Collection Time    04/30/14  7:52 AM      Result Value Ref Range   Glucose-Capillary 160 (*) 70 - 99 mg/dL  GLUCOSE, CAPILLARY     Status: Abnormal   Collection Time    04/30/14 11:31 AM      Result Value Ref Range   Glucose-Capillary 167 (*) 70 - 99 mg/dL  GLUCOSE, CAPILLARY     Status: None   Collection Time    04/30/14  4:37 PM      Result Value Ref Range   Glucose-Capillary 94  70 - 99 mg/dL  GLUCOSE, CAPILLARY     Status: Abnormal   Collection Time    04/30/14  8:28 PM      Result Value Ref Range   Glucose-Capillary 203 (*) 70 - 99 mg/dL  GLUCOSE, CAPILLARY     Status: Abnormal   Collection Time    05/01/14  7:53 AM      Result Value Ref Range   Glucose-Capillary 180 (*) 70 - 99 mg/dL   Labs are reviewed and are pertinent for .  Current Facility-Administered Medications  Medication Dose Route Frequency Provider Last Rate Last Dose  . acetaminophen (TYLENOL) tablet 650 mg  650 mg Oral Q6H PRN Toy Baker, MD       Or  . acetaminophen (TYLENOL) suppository 650 mg  650 mg Rectal Q6H PRN Toy Baker, MD      . antiseptic oral rinse (BIOTENE) solution 15 mL  15 mL Mouth Rinse  [id number] Allie Bossier, MD   15 mL at 05/01/14 1107  . atorvastatin (LIPITOR) tablet 40 mg  40 mg Oral Daily Cherene Altes, MD   40 mg at 05/01/14 1106  . carvedilol (COREG) tablet 25 mg  25 mg Oral BID WC Allie Bossier, MD   25 mg at 05/01/14 1106  . chlorhexidine (PERIDEX) 0.12 % solution 15 mL  15 mL Mouth Rinse BID Allie Bossier, MD   15 mL at 05/01/14 [phone]  . clopidogrel (PLAVIX) tablet 75 mg  75 mg Oral Q breakfast Cherene Altes, MD   75 mg at 05/01/14 1107  . feeding supplement (GLUCERNA SHAKE) (GLUCERNA SHAKE) liquid 237 mL  237 mL Oral BID BM Dagmar Hait, RD   237 mL at 05/01/14 1107  . furosemide (LASIX) tablet 40 mg  40 mg Oral Daily Cherene Altes, MD   40 mg at 05/01/14 1106  . guaiFENesin-dextromethorphan (ROBITUSSIN DM) 100-10 MG/5ML syrup 5 mL  5 mL Oral Q4H PRN Dianne Dun, NP      . hydrALAZINE (APRESOLINE) injection 10 mg  10 mg Intravenous Q4H PRN Allie Bossier, MD      . hydrALAZINE (APRESOLINE) tablet  15 mg  15 mg Oral 3 times per day Allie Bossier, MD   15 mg at 05/01/14 1107  . insulin aspart (novoLOG) injection 0-20 Units  0-20 Units Subcutaneous TID WC Cherene Altes, MD   4 Units at 05/01/14 [id number]  . insulin glargine (LANTUS) injection 14 Units  14 Units Subcutaneous QHS Cherene Altes, MD   14 Units at 04/30/14 2115  . latanoprost (XALATAN) 0.005 % ophthalmic solution 1 drop  1 drop Both Eyes QHS Toy Baker, MD   1 drop at 04/30/14 2116  . levothyroxine (SYNTHROID, LEVOTHROID) tablet 50 mcg  50 mcg Oral QAC breakfast Cherene Altes, MD   50 mcg at 05/01/14 1107  . lisinopril (PRINIVIL,ZESTRIL) tablet 15 mg  15 mg Oral Daily Allie Bossier, MD   15 mg at 05/01/14 1106  . olopatadine (PATANOL) 0.1 % ophthalmic solution 1 drop  1 drop Both Eyes BID Toy Baker, MD   1 drop at 05/01/14 1106  . ondansetron (ZOFRAN) tablet 4 mg  4 mg Oral Q6H PRN Toy Baker, MD       Or  . ondansetron (ZOFRAN) injection 4 mg  4 mg  Intravenous Q6H PRN Toy Baker, MD      . potassium chloride SA (K-DUR,KLOR-CON) CR tablet 20 mEq  20 mEq Oral Daily Cherene Altes, MD   20 mEq at 05/01/14 1106  . sodium chloride 0.9 % injection 3 mL  3 mL Intravenous Q12H Toy Baker, MD   3 mL at 04/28/14 2130    Psychiatric Specialty Exam: Physical Exam  ROS  Blood pressure 185/77, pulse 79, temperature 98.5 F (36.9 C), temperature source Oral, resp. rate 28, height _0  (1.575 m), weight 73.3 kg (161 lb 9.6 oz), SpO2 96.00%.Body mass index is 29.55 kg/(m^2).  General Appearance: Casual  Eye Contact::  Good  Speech:  Clear and Coherent  Volume:  Decreased  Mood:  NA and Euthymic  Affect:  Constricted and Depressed  Thought Process:  Coherent and Goal Directed  Orientation:  Full (Time, Place, and Person)  Thought Content:  WDL  Suicidal Thoughts:  No  Homicidal Thoughts:  No  Memory:  Immediate;   Fair Recent;   Fair  Judgement:  Good  Insight:  Good  Psychomotor Activity:  Decreased  Concentration:  Good  Recall:  Good  Fund of Knowledge:Good  Language: Good  Akathisia:  Negative  Handed:  Right  AIMS (if indicated):     Assets:  Communication Skills Desire for Improvement Housing Leisure Time Resilience Social Support Talents/Skills  Sleep:      Musculoskeletal: Strength & Muscle Tone: decreased Gait & Station: unable to stand Patient leans: N/A  Treatment Plan Summary: Daily contact with patient to assess and evaluate symptoms and progress in treatment Medication management  JONNALAGADDA,JANARDHAHA R. 05/01/2014 12:06 PM

## 2014-05-01 NOTE — Progress Notes (Signed)
Loop recorder 

## 2014-05-01 NOTE — Progress Notes (Signed)
Occupational Therapy Treatment Patient Details Name: [name] [name] MRN: [id number] DOB: [birth year] Today's Date: 05/01/2014    History of present illness Patient is a 73 yo female admitted 04/23/14 with hyperglycemia and encephalopathy. Patient with CVA - Acute infarct in the right anterior basal ganglia .  Patient with h/o HTN, DM with neuropathy, CRI, HLD, CVA in 03/24/14.   OT comments  Pt progressing toward goals. Requires constant cueing and assist to safely use RW during functional mobility. Continue to recommend SNF for d/c planning.  Follow Up Recommendations  SNF;Supervision/Assistance - 24 hour    Equipment Recommendations  None recommended by OT    Recommendations for Other Services      Precautions / Restrictions Precautions Precautions: Fall       Mobility Bed Mobility               General bed mobility comments: not assessed-pt up in chair  Transfers Overall transfer level: Needs assistance Equipment used: Rolling walker (2 wheeled) Transfers: Sit to/from Stand Sit to Stand: Min assist         General transfer comment: Assist for power up.    Balance                                   ADL       Grooming: Minimal assistance;Standing;Wash/dry hands                   Toilet Transfer: Minimal assistance;Ambulation           Functional mobility during ADLs: Minimal assistance General ADL Comments: Pt requires increased time to complete tasks.  Constant cueing to maneuver RW safely.      Vision                     Perception     Praxis      Cognition   Behavior During Therapy: Flat affect Overall Cognitive Status: Impaired/Different from baseline Area of Impairment: Attention;Memory;Safety/judgement;Problem solving   Current Attention Level: Sustained Memory: Decreased short-term memory    Safety/Judgement: Decreased awareness of deficits;Decreased awareness of safety Awareness: Emergent Problem  Solving: Slow processing;Difficulty sequencing      Extremity/Trunk Assessment               Exercises     Shoulder Instructions       General Comments      Pertinent Vitals/ Pain       No c/o pain.  Home Living                                          Prior Functioning/Environment              Frequency Min 2X/week     Progress Toward Goals  OT Goals(current goals can now be found in the care plan section)  Progress towards OT goals: Progressing toward goals  Acute Rehab OT Goals Patient Stated Goal: pt. did not state ADL Goals Pt Will Perform Eating: with modified independence;sitting Pt Will Perform Grooming: with modified independence;sitting Pt Will Transfer to Toilet: stand pivot transfer;bedside commode;with min guard assist Pt Will Perform Toileting - Clothing Manipulation and hygiene: with min guard assist;sit to/from stand  Plan Discharge plan remains appropriate    Co-evaluation  End of Session Equipment Utilized During Treatment: Gait belt;Rolling walker   Activity Tolerance Patient tolerated treatment well   Patient Left in chair;with call bell/phone within reach;with family/visitor present   Nurse Communication Mobility status        Time: [id number] OT Time Calculation (min): 13 min  Charges: OT General Charges $OT Visit: 1 Procedure OT Treatments $Self Care/Home Management : 8-22 mins  Cipriano MileJohnson, Jenna Elizabeth 05/01/2014, 2:43 PM 05/01/2014 Cipriano MileJohnson, Jenna Elizabeth OTR/L Pager [phone] Office [phone]

## 2014-05-01 NOTE — Discharge Summary (Addendum)
Physician Discharge Summary  [name] [name] MRN: [id number] DOB/AGE: 1941-02-28 73 y.o.  PCP: Salena Saner., MD   Admit date: 04/23/2014 Discharge date: 05/01/2014  Discharge Diagnoses:   :   Acute CVA (cerebrovascular accident) Acute infarct right anterior basal ganglia    Peripheral vascular disease, unspecified   History of CVA (cerebrovascular accident)   Hypertension   DM type 2, uncontrolled, with renal complications   CKD (chronic kidney disease), stage III   Acute on chronic renal failure   Metabolic encephalopathy   Dehydration   Patient non adherence    Medication List    STOP taking these medications       hydrochlorothiazide 25 MG tablet  Commonly known as:  HYDRODIURIL     metoprolol succinate 25 MG 24 hr tablet  Commonly known as:  TOPROL-XL     naproxen 500 MG tablet  Commonly known as:  NAPROSYN      TAKE these medications       ALLERGY RELIEF PO  Take 1 tablet by mouth daily as needed (for allergies).     atorvastatin 40 MG tablet  Commonly known as:  LIPITOR  Take 40 mg by mouth daily.     bimatoprost 0.01 % Soln  Commonly known as:  LUMIGAN  Place 1 drop into both eyes at bedtime.     carvedilol 25 MG tablet  Commonly known as:  COREG  Take 1 tablet (25 mg total) by mouth 2 (two) times daily with a meal.     clopidogrel 75 MG tablet  Commonly known as:  PLAVIX  Take 1 tablet (75 mg total) by mouth daily with breakfast.     furosemide 20 MG tablet  Commonly known as:  LASIX  Take 40 mg by mouth daily.     gabapentin 300 MG capsule  Commonly known as:  NEURONTIN  Take 1 capsule (300 mg total) by mouth 2 (two) times daily.     guaiFENesin-dextromethorphan 100-10 MG/5ML syrup  Commonly known as:  ROBITUSSIN DM  Take 5 mLs by mouth every 4 (four) hours as needed for cough.     hydrALAZINE 50 MG tablet  Commonly known as:  APRESOLINE  Take 0.5 tablets (25 mg total) by mouth 3 (three) times daily.     insulin  NPH-regular Human (70-30) 100 UNIT/ML injection  Commonly known as:  NOVOLIN 70/30  Inject 15 Units into the skin 2 (two) times daily with a meal.     levothyroxine 50 MCG tablet  Commonly known as:  SYNTHROID, LEVOTHROID  Take 1 tablet (50 mcg total) by mouth daily.     lisinopril 20 MG tablet  Commonly known as:  PRINIVIL,ZESTRIL  Take 1 tablet (20 mg total) by mouth daily.     metFORMIN 1000 MG tablet  Commonly known as:  GLUCOPHAGE  Take 1 tablet (1,000 mg total) by mouth 2 (two) times daily with a meal.     Olopatadine HCl 0.2 % Soln  Commonly known as:  PATADAY  Place 1 drop into both eyes daily.     omeprazole 40 MG capsule  Commonly known as:  PRILOSEC  Take 1 capsule (40 mg total) by mouth daily.     potassium chloride SA 20 MEQ tablet  Commonly known as:  K-DUR,KLOR-CON  Take 1 tablet (20 mEq total) by mouth daily.     Vitamin D3 2000 UNITS Tabs  Take 2,000 Units by mouth daily.  Disposition: 01-Home or Self Care   Consults: Neurology   Significant Diagnostic Studies: Dg Chest 2 View  04/23/2014   CLINICAL DATA:  Fatigue and lethargy.  Hyperglycemia.  EXAM: CHEST  2 VIEW  COMPARISON:  04/07/2014.  02/05/2013.  FINDINGS: Cardiopericardial silhouette within normal limits. Mediastinal contours normal. Trachea midline. No airspace disease or effusion. Monitoring leads project over the chest. Mild left glenohumeral joint osteoarthritis. Question prior proximal left humerus fracture. Aortic arch atherosclerosis.  IMPRESSION: No active cardiopulmonary disease.   Electronically Signed   By: Dereck Ligas M.D.   On: 04/23/2014 23:30   Ct Head Wo Contrast  04/24/2014   CLINICAL DATA:  weakness  EXAM: CT HEAD WITHOUT CONTRAST  TECHNIQUE: Contiguous axial images were obtained from the base of the skull through the vertex without intravenous contrast.  COMPARISON:  Prior MRI from 03/21/2014  FINDINGS: Generalized cerebral atrophy with advanced chronic  microvascular ischemic disease is present, similar to prior.  There is increased prominence of focal hypodensity in the region of the right caudate head/anterior limb of the right internal capsule (series 2, image 13). While this finding may be related to chronic small vessel ischemic changes, possible acute ischemia is not excluded. No acute intracranial hemorrhage.  No mass lesion or midline shift. Gray-white matter differentiation is well maintained. Ventricles are normal in size without evidence of hydrocephalus. CSF containing spaces are within normal limits. No extra-axial fluid collection.  The calvarium is intact.  Orbital soft tissues are within normal limits.  The paranasal sinuses and mastoid air cells are well pneumatized and free of fluid.  Scalp soft tissues are unremarkable.  IMPRESSION: 1. Age indeterminate focal hypodensity within the right caudate head/anterior limb of the right internal capsule. While this finding may be related to chronic small vessel ischemic changes in this region, possible superimposed acute ischemia is not excluded. Further evaluation with brain MRI could be performed if there is clinical concern for acute ischemic infarct in this region. 2. No intracranial hemorrhage. 3. Generalized cerebral atrophy with advanced chronic microvascular ischemic disease.   Electronically Signed   By: Jeannine Boga M.D.   On: 04/24/2014 03:03   Mri Brain Without Contrast  04/24/2014   CLINICAL DATA:  Stroke  EXAM: MRI HEAD WITHOUT CONTRAST  MRA HEAD WITHOUT CONTRAST  TECHNIQUE: Multiplanar, multiecho pulse sequences of the brain and surrounding structures were obtained without intravenous contrast. Angiographic images of the head were obtained using MRA technique without contrast.  COMPARISON:  CT 04/24/2014, MRI 03/21/2014  FINDINGS: MRI HEAD FINDINGS  Acute infarct in the right anterior basal ganglia involving the deep white matter structures. No other acute infarct identified.   Generalized atrophy. Moderate chronic microvascular ischemic change throughout the white matter, basal ganglia, thalamic, and pons. Numerous areas of chronic micro hemorrhage in the brain. No mass or edema.  MRA HEAD FINDINGS  Both vertebral arteries are patent to the basilar. Mild disease in the distal left vertebral artery. The basilar is widely patent. Small PICA patent bilaterally. Small superior cerebellar arteries are patent. Posterior cerebral arteries are patent with a moderate stenosis in the right PCA unchanged from the prior study. Mild disease in the left PCA.  Atherosclerotic disease in the right cavernous carotid with mild to moderate stenosis and vessel irregularity. Right anterior middle cerebral arteries are patent. Mild stenosis in the temporal branch of the right MCA.  Atherosclerotic disease with mild diffuse stenosis in the left cavernous carotid. Left anterior and middle cerebral arteries are patent. Mild  atherosclerotic disease in the left middle cerebral artery branches  Negative for cerebral aneurysm.  IMPRESSION: Acute infarct right anterior basal ganglia compared with prior studies.  Atrophy with moderate to advanced chronic microvascular ischemia. Numerous areas of chronic micro hemorrhage in the brain consistent with hypertension  Intracranial atherosclerotic disease is similar to the prior study. No large vessel occlusion is identified.   Electronically Signed   By: Franchot Gallo M.D.   On: 04/24/2014 19:48   Dg Chest Portable 1 View  04/07/2014   CLINICAL DATA:  Cough and congestion  EXAM: PORTABLE CHEST - 1 VIEW  COMPARISON:  03/21/2014  FINDINGS: Cardiac shadow is stable. The lungs are well aerated bilaterally. A small monitoring device is noted no bony abnormality is seen.  IMPRESSION: No acute abnormality is noted.   Electronically Signed   By: Inez Catalina M.D.   On: 04/07/2014 09:16   Mr Jodene Nam Head/brain Wo Cm  04/24/2014   CLINICAL DATA:  Stroke  EXAM: MRI HEAD WITHOUT  CONTRAST  MRA HEAD WITHOUT CONTRAST  TECHNIQUE: Multiplanar, multiecho pulse sequences of the brain and surrounding structures were obtained without intravenous contrast. Angiographic images of the head were obtained using MRA technique without contrast.  COMPARISON:  CT 04/24/2014, MRI 03/21/2014  FINDINGS: MRI HEAD FINDINGS  Acute infarct in the right anterior basal ganglia involving the deep white matter structures. No other acute infarct identified.  Generalized atrophy. Moderate chronic microvascular ischemic change throughout the white matter, basal ganglia, thalamic, and pons. Numerous areas of chronic micro hemorrhage in the brain. No mass or edema.  MRA HEAD FINDINGS  Both vertebral arteries are patent to the basilar. Mild disease in the distal left vertebral artery. The basilar is widely patent. Small PICA patent bilaterally. Small superior cerebellar arteries are patent. Posterior cerebral arteries are patent with a moderate stenosis in the right PCA unchanged from the prior study. Mild disease in the left PCA.  Atherosclerotic disease in the right cavernous carotid with mild to moderate stenosis and vessel irregularity. Right anterior middle cerebral arteries are patent. Mild stenosis in the temporal branch of the right MCA.  Atherosclerotic disease with mild diffuse stenosis in the left cavernous carotid. Left anterior and middle cerebral arteries are patent. Mild atherosclerotic disease in the left middle cerebral artery branches  Negative for cerebral aneurysm.  IMPRESSION: Acute infarct right anterior basal ganglia compared with prior studies.  Atrophy with moderate to advanced chronic microvascular ischemia. Numerous areas of chronic micro hemorrhage in the brain consistent with hypertension  Intracranial atherosclerotic disease is similar to the prior study. No large vessel occlusion is identified.   Electronically Signed   By: Franchot Gallo M.D.   On: 04/24/2014 19:48       Microbiology: Recent Results (from the past 240 hour(s))  MRSA PCR SCREENING     Status: None   Collection Time    04/24/14  8:50 AM      Result Value Ref Range Status   MRSA by PCR NEGATIVE  NEGATIVE Final   Comment:            The GeneXpert MRSA Assay (FDA     approved for NASAL specimens     only), is one component of a     comprehensive MRSA colonization     surveillance program. It is not     intended to diagnose MRSA     infection nor to guide or     monitor treatment for     MRSA  infections.     Labs: Results for orders placed during the hospital encounter of 04/23/14 (from the past 48 hour(s))  GLUCOSE, CAPILLARY     Status: Abnormal   Collection Time    04/29/14  5:39 PM      Result Value Ref Range   Glucose-Capillary 235 (*) 70 - 99 mg/dL  GLUCOSE, CAPILLARY     Status: Abnormal   Collection Time    04/29/14  8:21 PM      Result Value Ref Range   Glucose-Capillary 206 (*) 70 - 99 mg/dL  BASIC METABOLIC PANEL     Status: Abnormal   Collection Time    04/30/14 12:00 AM      Result Value Ref Range   Sodium 142  137 - 147 mEq/L   Potassium 4.2  3.7 - 5.3 mEq/L   Chloride 105  96 - 112 mEq/L   CO2 24  19 - 32 mEq/L   Glucose, Bld 152 (*) 70 - 99 mg/dL   BUN 9  6 - 23 mg/dL   Creatinine, Ser 1.12 (*) 0.50 - 1.10 mg/dL   Calcium 8.7  8.4 - 10.5 mg/dL   GFR calc non Af Amer 47 (*) >90 mL/min   GFR calc Af Amer 55 (*) >90 mL/min   Comment: (NOTE)     The eGFR has been calculated using the CKD EPI equation.     This calculation has not been validated in all clinical situations.     eGFR's persistently <90 mL/min signify possible Chronic Kidney     Disease.  GLUCOSE, CAPILLARY     Status: Abnormal   Collection Time    04/30/14  7:52 AM      Result Value Ref Range   Glucose-Capillary 160 (*) 70 - 99 mg/dL  GLUCOSE, CAPILLARY     Status: Abnormal   Collection Time    04/30/14 11:31 AM      Result Value Ref Range   Glucose-Capillary 167 (*) 70 - 99  mg/dL  GLUCOSE, CAPILLARY     Status: None   Collection Time    04/30/14  4:37 PM      Result Value Ref Range   Glucose-Capillary 94  70 - 99 mg/dL  GLUCOSE, CAPILLARY     Status: Abnormal   Collection Time    04/30/14  8:28 PM      Result Value Ref Range   Glucose-Capillary 203 (*) 70 - 99 mg/dL  GLUCOSE, CAPILLARY     Status: Abnormal   Collection Time    05/01/14  7:53 AM      Result Value Ref Range   Glucose-Capillary 180 (*) 70 - 99 mg/dL     HPI :  73 year old female with a past medical history of hypertension, diabetes type 2, hypothyroidism presented with altered mental status on 6/18.in to ER and was noted to be encephalopathic with increased somnolence. CT showed age indeterminate CVA .Lab work was c/w acute renal failure with elevation in cr from 1.8 to 2.6. Patient appeared hemoconcentrated with poor PO intake per family. ABG was attempted but patient was not cooperative.     HOSPITAL COURSE: Metabolic encephalopathy/ History of CVA/acute CVA  -MRI showing acute stroke see results below  -Secondary to ARF, dehydration, and acute CVA as etiology  -UA unremarkable  -SLP plan ; continue Dysphagia 1 (puree);Nectar-thick liquid Liquids provided via: Cup;No straw  -PT /OT; recommends SNF; supervision/assistance 24-hour; patient has a bed at [facility] SNF  -6/26 Psychiatry  reconsulted  States that the patient does meet criteria for capacity to make her own medical decisions Discharge to [facility]    Acute infarct right anterior basal ganglia w/ prior hx of CVA  -MRI revealed an acute stroke - see results below  -failed initial swallow eval at bedside , however on 6/20 passed swallow eval  -underwent recent extensive CVA w/u, to include TEE and loop recorder - was started on Plavix at that time  -Dr. Sherral Hammers has indicated that Cardiology has been consulted and did evaluate the loop recorder  -NSR on monitor - resume Plavix as cleared for diet 6/20 Neurology  consulted Dr. Nicole Kindred prior to discharge, today and they will make final recommendations  Hypertension, uncontrolled Uncontrolled in the setting of acute renal failure therefore Lasix and lisinopril and HCTZ were discontinued Has been transitioned to Coreg 25 mg twice a day, hydralazine, lisinopril resumed HCTZ has been discontinued Hydralazine has been increased to 50 mg 3 times a day  DM type 2, uncontrolled, with renal complications 1/76 hemoglobin A1c= 8.9  -Lipid panel within ADA guidelines  -Continue Lantus to 14 units QHS   Acute on chronic renal failure/CKD (chronic kidney disease), stage III-prerenal Secondary to diuretics HCTZ discontinued, Lasix and ACE inhibitor was held but resume prior to discharge Patient will need a repeat BMP on a weekly basis pt had been instructed to stop meloxicam and naproxen at discharge last admit but med rec shows her to still be taking  -6/25 Cr 1.12  Hypothyroidism  -cont Synthroid 25 mcg daily   Discharge Exam:   Blood pressure 185/77, pulse 79, temperature 98.5 F (36.9 C), temperature source Oral, resp. rate 28, height '5\' 2"'  (1.575 m), weight 73.3 kg (161 lb 9.6 oz), SpO2 96.00%.  General: A./O. x2 (does not know where, why she is here), patient not having delirium vs delusions, NAD, No acute respiratory distress  Lungs: Clear to auscultation bilaterally without wheezes or crackles  Cardiovascular: Regular rate and rhythm without murmur gallop or rub normal S1 and S2  Abdomen: Obese, Nontender, nondistended, soft, bowel sounds positive, no rebound, no ascites, no appreciable mass  Extremities: No significant cyanosis, clubbing, or edema bilateral lower extremities  Neurologic; pupils equal round reactive to light and accommodation, cranial nerves II through XII intact, tongue/uvula midline, mild left hemiparesis 4/5 strength in upper/lower extremity, sensation intact throughout, patient able to ambulate with one-person  assist.          Follow-up Information   Follow up with Salena Saner., MD.   Specialty:  Internal Medicine   Contact information:   [address] [place] [address] [phone]       Signed: Reyne Dumas 05/01/2014, 12:18 PM

## 2014-05-01 NOTE — Progress Notes (Signed)
Patient was found in chair unresponsive by nurse tech. RN called to assess patient. Patient found to be unresponsive, tongue out of mouth, drooling, leaning to the right side. Sternal rub applied to patient with no response. Difficult to assess pulse. Code blue called. Patient transferred to bed. Code team arrived and MD Abrol notified. Compressions started and patient was moaning but not responding to staff. Patient placed on 100% NRB. Patient began to respond to staff when IV team began sticking patient. Patient began speaking, stated she didn't feel well. Patient able to move all extremities. Husband at bedside. Son, Verlon AuLeslie, who stated he is the POA, called by husband. Son, updated by MD at bedside. New orders placed by MD and patient transferred to [facility].

## 2014-05-01 NOTE — Progress Notes (Addendum)
Patient transferred to step down after an unresponsive episode. The patient was awake, alert and the time of my exam after a rapid response was called. The patient was moving all 4 extremities. She denies any headache. She was hypertensive initially today, blood pressure at the time of her rapid response was 1 44 /71. No gross focal neurologic deficits were appreciated. Neurology consultation has been requested for recurrent stroke during this admission. Patient wanted to have a stat troponin, ABG, chest x-ray, EKG, CT of the head. We'll continue to follow along

## 2014-05-01 NOTE — Progress Notes (Signed)
Chaplain receive Code Blue page for this pt. Chaplain responded and find husband to be in attendance in the room. Pt was revived. Chaplin provided prayer and emotional support for family member.  Cindie CrumblyBeverley Jarrell, Candelaria StagersChaplin  05/01/14 1200  Clinical Encounter Type  Visited With Family  Visit Type Code  Referral From Nurse  Consult/Referral To Chaplain  Spiritual Encounters  Spiritual Needs Prayer;Emotional  Stress Factors  Patient Stress Factors None identified  Family Stress Factors Health changes

## 2014-05-01 NOTE — Significant Event (Signed)
Rapid Response Event Note  Overview: Time Called: 1235 Arrival Time: 1236 Event Type: Neurologic  Initial Focused Assessment:Per staff:  Patient was sitting in chair when NT checked her blood sugar (CBG 105)  Patient was not responding normally, but with a pulse and breathing normally.  NT stepped out to alert RN, upon their arrival patient unresponsive and drooling, Difficulty assessing pulse, assisted to bed and code blue called. Came to patient's bedside with Code Blue call.  Patient lying in bed, Dr Gloriann LoanSantani at bedside assessing patient.  Difficulty to assess pulse, Chest compressions x 5, patient moaning and moving.  O2 via ambu bag then transitioned to 100% NRB.  Oral suction, pt minimally responsive.  BP 127/67  SR 63  RR 28.    Patient began to respond to staff. She is able to move all extremities equally, she is able to speak, she states that she doesn't feel good but no specific complaints. Pupils are pinpoint. Family at bedside, updated on patient status and event.  Patient states to family that she feels fine Transported to radiology for head Ct Transferred to 2C16 via bed with O2 and heart monitor.    Interventions:   Event Summary: Name of Physician Notified: Dr Susie CassetteAbrol at 1240    at    Outcome: Transferred (Comment)     Marcellina MillinLayton, Helle

## 2014-05-01 NOTE — Progress Notes (Signed)
Physical Therapy Treatment Patient Details Name: [name] [name] MRN: [id number] DOB: 1941/03/04 Today's Date: 05/01/2014    History of Present Illness Patient is a 73 yo female admitted 04/23/14 with hyperglycemia and encephalopathy. Patient with CVA - Acute infarct in the right anterior basal ganglia .  Patient with h/o HTN, DM with neuropathy, CRI, HLD, CVA in 03/24/14.    PT Comments    Pt. Is alert, oriented to person, place, time and that she had a stroke.  SHe is asking for popcorn band does not seem to be satisfied that there is no popcorn on the floor.  Needing min assist to safely mobilize and needs increased time.  Follow Up Recommendations  SNF;Supervision/Assistance - 24 hour     Equipment Recommendations  None recommended by PT    Recommendations for Other Services       Precautions / Restrictions Precautions Precautions: Fall Restrictions Weight Bearing Restrictions: No    Mobility  Bed Mobility Overal bed mobility: Needs Assistance Bed Mobility: Supine to Sit     Supine to sit: Min assist     General bed mobility comments: min assist to raise trunk to sitting position, able to move LE to EOB  Transfers Overall transfer level: Needs assistance Equipment used: Rolling walker (2 wheeled) Transfers: Sit to/from Stand Sit to Stand: Min assist         General transfer comment: increased time and vc's for technique, min assist to power up to standing  Ambulation/Gait Ambulation/Gait assistance: Min assist Ambulation Distance (Feet): 70 Feet Assistive device: Rolling walker (2 wheeled) Gait Pattern/deviations: Step-through pattern Gait velocity: Decreased   General Gait Details: vc's. manual guidance due to vision deficits, min assist to advance RW and for pt.'s safety and stability   Stairs            Wheelchair Mobility    Modified Rankin (Stroke Patients Only)       Balance                                    Cognition  Arousal/Alertness: Awake/alert Behavior During Therapy: Flat affect Overall Cognitive Status: Impaired/Different from baseline Area of Impairment: Attention;Memory;Safety/judgement;Problem solving   Current Attention Level: Sustained Memory: Decreased short-term memory   Safety/Judgement: Decreased awareness of deficits;Decreased awareness of safety Awareness: Emergent Problem Solving: Slow processing;Difficulty sequencing      Exercises      General Comments        Pertinent Vitals/Pain See vitals tab No distress    Home Living                      Prior Function            PT Goals (current goals can now be found in the care plan section) Acute Rehab PT Goals Patient Stated Goal: pt. did not state Progress towards PT goals: Progressing toward goals    Frequency  Min 3X/week    PT Plan Current plan remains appropriate    Co-evaluation             End of Session Equipment Utilized During Treatment: Gait belt Activity Tolerance: Patient tolerated treatment well Patient left: in chair;with call bell/phone within reach;with chair alarm set;with family/visitor present     Time: [id number] PT Time Calculation (min): 23 min  Charges:  $Gait Training: 23-37 mins  G CodesFerman Hamming:      Jadesola Poynter B 05/01/2014, 10:08 AM Weldon PickingSusan Benji Poynter PT Acute Rehab Services [phone] [place] [phone]

## 2014-05-01 NOTE — Clinical Social Work Note (Signed)
Patient transferred from 6E to [facility]C. CSW will continue to monitor patient's progress and assist with discharge to a skilled nursing facility [facility] when medically stable. Signed FL-2 placed in shadow chart.  Genelle BalVanessa Crawford, MSW, LCSW [phone]

## 2014-05-01 NOTE — Consult Note (Signed)
Referring Physician: Joseph Art    Chief Complaint: stroke  HPI:                                                                                                                                         [name] [name] is an 73 y.o. female with PMHx CVA who was admitted to hospital on 6/19 due to family noting she was shaky and sweaty. . On arrival to ED she was encephalopathic. Initial CT head was negative for infarct. patient was transferred to step down do to her somnolence.  Cr. Was noted to be elevated and patient was given fluids. MRI on this visit revealed a acute infarct right anterior basal ganglia compared with prior studies. Neurology was consulted for new acute infarct.   Patient was on Lipitor and Plavix daily prior to admission.   During hospitalization patients BP has been elevated 170/58--208/78  Most recent event while she was on  6th floor--today at 1348 patient was noted to be unresponsive. Code blue was called. Per note NT stepped out to alert RN, upon their arrival patient unresponsive and drooling, Difficulty assessing pulse, assisted to bed and code blue called.  On arrival of rapid response patient was alert and oriented. Currently remains alert and oriented.     01/2014  2D Echocardiogram 01/10/2014 ejection fraction 60-65%. No cardiac source of emboli identified.  Carotid Doppler 01/11/2014  Right: Mild focal calcific plaque distal CCA and origin ICA. Left: Moderate calcific plaque with acoustic shadowing origin ICA.  Bilateral: 1-39% ICA stensois. Vertebral artery flow is antegrade.  MRI/MRA 03/2014 MRI / MRA of the brain   MRI head  Left mesial temporal lobe, left periventricular frontal white matter and left superior cerebellar subcentimeter foci of acute ischemia. These span multiple vascular territories, and may reflect thromboembolic disease without acute large territory infarct.  Moderately motion degraded examination. Scattered susceptibility artifact most  consistent with sequelae of chronic hypertension, with multiple remote basal ganglia/thalamic and cerebellar infarcts.Severe white matter changes suggest chronic small vessel ischemic disease.  A1c 10.1 LDL 108  TEE: Study Conclusions  - Left ventricle: There was mild concentric hypertrophy. Systolic function was normal. The estimated ejection fraction was in the range of 60% to 65%. Wall motion was normal; there were no regional wall motion abnormalities. - Mitral valve: There was mild regurgitation. - Left atrium: No evidence of thrombus in the atrial cavity or appendage. No evidence of thrombus in the atrial cavity or appendage. No evidence of thrombus in the appendage. - Right atrium: No evidence of thrombus in the atrial cavity or appendage. - Atrial septum: There was a patent foramen ovale.  Impressions:  - PFO is present.       Date last known well: Unable to determine Time last known well: Unable to determine tPA Given: No: recent CVA and no LSN  Past Medical History  Diagnosis Date  .  Hypertension   . Diabetes mellitus   . Chronic renal insufficiency   . Hyperlipidemia   . Osteopenia   . CVA (cerebral infarction)     Past Surgical History  Procedure Laterality Date  . Eye surgery    . Loop recorder implant  03-24-2014    MDT LinQ implanted by Dr Johney FrameAllred for cryptogenic stroke  . Tee without cardioversion N/A 03/23/2014    Procedure: TRANSESOPHAGEAL ECHOCARDIOGRAM (TEE);  Surgeon: Lars MassonKatarina H Nelson, MD;  [facility];  Service: Cardiovascular;  Laterality: N/A;    Family History  Problem Relation Age of Onset  . Diabetes Mother   . Hypertension Mother    Social History:  reports that she has been smoking Cigarettes.  She has a 30 pack-year smoking history. She does not have any smokeless tobacco history on file. She reports that she drinks alcohol. She reports that she does not use illicit drugs.  Allergies:  Allergies  Allergen Reactions  .  Erythromycin Other (See Comments)    unknown  . Iodine     REACTION: hives  . Penicillins     REACTION: hives    Medications:                                                                                                                           Scheduled: . antiseptic oral rinse  15 mL Mouth Rinse q12n4p  . atorvastatin  40 mg Oral Daily  . carvedilol  25 mg Oral BID WC  . chlorhexidine  15 mL Mouth Rinse BID  . clopidogrel  75 mg Oral Q breakfast  . feeding supplement (GLUCERNA SHAKE)  237 mL Oral BID BM  . furosemide  40 mg Oral Daily  . hydrALAZINE  15 mg Oral 3 times per day  . insulin aspart  0-20 Units Subcutaneous TID WC  . insulin glargine  14 Units Subcutaneous QHS  . latanoprost  1 drop Both Eyes QHS  . levothyroxine  50 mcg Oral QAC breakfast  . lisinopril  15 mg Oral Daily  . olopatadine  1 drop Both Eyes BID  . potassium chloride SA  20 mEq Oral Daily  . sodium chloride  3 mL Intravenous Q12H    ROS:  History obtained from the patient  General ROS: negative for - chills, fatigue, fever, night sweats, weight gain or weight loss Psychological ROS: negative for - behavioral disorder, hallucinations, memory difficulties, mood swings or suicidal ideation Ophthalmic ROS: negative for - blurry vision, double vision, eye pain or loss of vision ENT ROS: negative for - epistaxis, nasal discharge, oral lesions, sore throat, tinnitus or vertigo Allergy and Immunology ROS: negative for - hives or itchy/watery eyes Hematological and Lymphatic ROS: negative for - bleeding problems, bruising or swollen lymph nodes Endocrine ROS: negative for - galactorrhea, hair pattern changes, polydipsia/polyuria or temperature intolerance Respiratory ROS: negative for - cough, hemoptysis, shortness of breath or wheezing Cardiovascular ROS: negative for - chest  pain, dyspnea on exertion, edema or irregular heartbeat Gastrointestinal ROS: negative for - abdominal pain, diarrhea, hematemesis, nausea/vomiting or stool incontinence Genito-Urinary ROS: negative for - dysuria, hematuria, incontinence or urinary frequency/urgency Musculoskeletal ROS: negative for - joint swelling or muscular weakness Neurological ROS: as noted in HPI Dermatological ROS: negative for rash and skin lesion changes  Neurologic Examination:                                                                                                      Blood pressure 185/77, pulse 79, temperature 98.5 F (36.9 C), temperature source Oral, resp. rate 28, height 5\' 2"  (1.575 m), weight 73.3 kg (161 lb 9.6 oz), SpO2 96.00%.   Mental Status: Alert, oriented, thought content appropriate.  Speech fluent without evidence of aphasia.  Able to follow 3 step commands without difficulty. Cranial Nerves: II: Discs flat bilaterally; Visual fields shows a right hemianopsia, pupils equal 2mm, round, reactive to light and accommodation III,IV, VI: ptosis not present, extra-ocular motions intact bilaterally V,VII: smile symmetric, facial light touch sensation normal bilaterally VIII: hearing normal bilaterally IX,X: gag reflex present XI: bilateral shoulder shrug XII: midline tongue extension without atrophy or fasciculations  Motor: Right : Upper extremity   5/5    Left:     Upper extremity   5/5  Lower extremity   5/5     Lower extremity   5/5 Tone and bulk:normal tone throughout; no atrophy noted Sensory: Pinprick and light touch intact throughout, bilaterally Deep Tendon Reflexes:  1+ bilateral UE and no KJ or AJ  Plantars: Mute bilaterally Cerebellar: normal finger-to-nose,  normal heel-to-shin test Gait: not tested CV: pulses palpable throughout    Lab Results: Basic Metabolic Panel:  Recent Labs Lab 04/25/14 0315 04/26/14 1633 04/27/14 0200 04/28/14 0245 04/28/14 0947  04/29/14 0243 04/30/14  NA 146 142 143 143  --   --  142  K 3.2* 3.8 2.8* 3.8  --   --  4.2  CL 105 107 106 108  --   --  105  CO2 23 18* 23 21  --   --  24  GLUCOSE 89 187* 215* 261*  --   --  152*  BUN 26* 10 8 7   --   --  9  CREATININE 1.58* 1.08 1.18* 1.13*  --   --  1.12*  CALCIUM  8.6 8.3* 8.6 8.4  --   --  8.7  MG 1.4* 1.4* 1.4*  --  1.5 1.9  --   PHOS 3.2  --   --   --   --   --   --     Liver Function Tests:  Recent Labs Lab 04/25/14 0315  AST 18  ALT 7  ALKPHOS 52  BILITOT 0.3  PROT 6.8  ALBUMIN 3.2*   No results found for this basename: LIPASE, AMYLASE,  in the last 168 hours No results found for this basename: AMMONIA,  in the last 168 hours  CBC:  Recent Labs Lab 04/25/14 0315 04/26/14 1633 04/27/14 0200 04/28/14 0245  WBC 7.0 5.5 6.2 5.9  HGB 11.5* 12.1 12.4 11.8*  HCT 33.9* 35.3* 36.5 34.6*  MCV 86.0 86.9 85.9 86.3  PLT 211 168 234 223    Cardiac Enzymes:  Recent Labs Lab 04/24/14 1657 04/24/14 2249 05/01/14 1250  TROPONINI <0.30 <0.30 <0.30    Lipid Panel:  Recent Labs Lab 04/25/14 0315  CHOL 129  TRIG 127  HDL 37*  CHOLHDL 3.5  VLDL 25  LDLCALC 67    CBG:  Recent Labs Lab 04/30/14 1131 04/30/14 1637 04/30/14 2028 05/01/14 0753 05/01/14 1228  GLUCAP 167* 94 203* 180* 105*    Microbiology: Results for orders placed during the hospital encounter of 04/23/14  MRSA PCR SCREENING     Status: None   Collection Time    04/24/14  8:50 AM      Result Value Ref Range Status   MRSA by PCR NEGATIVE  NEGATIVE Final   Comment:            The GeneXpert MRSA Assay (FDA     approved for NASAL specimens     only), is one component of a     comprehensive MRSA colonization     surveillance program. It is not     intended to diagnose MRSA     infection nor to guide or     monitor treatment for     MRSA infections.    Coagulation Studies: No results found for this basename: LABPROT, INR,  in the last 72 hours  Imaging: Dg  Chest Port 1 View  05/01/2014   CLINICAL DATA:  Unresponsive.  EXAM: PORTABLE CHEST - 1 VIEW  COMPARISON:  04/21/2014.  FINDINGS: Mediastinum and hilar structures normal. Lungs are clear. Heart size normal. Pacing pads and small monitoring device noted over the chest. No pleural effusion or pneumothorax. No acute bony abnormality.  IMPRESSION: No active disease.   Electronically Signed   By: Maisie Fus  Register   On: 05/01/2014 13:35       Assessment and plan discussed with with attending physician and they are in agreement.    Felicie Morn PA-C [facility] [phone]  05/01/2014, 2:15 PM   Assessment: 73 y.o. female with acute infarct right anterior basal ganglia in the setting of elevated BP. Time of event is unknown and patients BP has been elevated throughout hospital stay. Patietn is already on Lipitor and Plavix.  Current exam shows no new symptoms and old right hemianopsia.   Stroke Risk Factors - diabetes mellitus, hyperlipidemia and hypertension   Recommend: 1) Continue Plavix and Lipitor daily 2) PT/OT 3) patient is now 7 days out --would aggressively control BP at this time.    I personally participated in this patient's evaluation and management, including formulating above clinical impression and management recommendations.  CR Margaretha Glassing.D.  [facility] [phone]

## 2014-05-02 LAB — BASIC METABOLIC PANEL
BUN: 15 mg/dL (ref 6–23)
CALCIUM: 8.9 mg/dL (ref 8.4–10.5)
CO2: 27 mEq/L (ref 19–32)
Chloride: 107 mEq/L (ref 96–112)
Creatinine, Ser: 1.21 mg/dL — ABNORMAL HIGH (ref 0.50–1.10)
GFR, EST AFRICAN AMERICAN: 50 mL/min — AB (ref >90)
GFR, EST NON AFRICAN AMERICAN: 43 mL/min — AB (ref >90)
Glucose, Bld: 105 mg/dL — ABNORMAL HIGH (ref 70–99)
POTASSIUM: 4 meq/L (ref 3.7–5.3)
SODIUM: 146 meq/L (ref 137–147)

## 2014-05-02 LAB — GLUCOSE, CAPILLARY
GLUCOSE-CAPILLARY: 87 mg/dL (ref 70–99)
GLUCOSE-CAPILLARY: 121 mg/dL — AB (ref 70–99)
Glucose-Capillary: 161 mg/dL — ABNORMAL HIGH (ref 70–99)
Glucose-Capillary: 149 mg/dL — ABNORMAL HIGH (ref 70–99)

## 2014-05-02 NOTE — Progress Notes (Addendum)
[facility] PROGRESS NOTE  [name] [name] ZOX:[id number] DOB: 03-30-1941 DOA: 04/23/2014 PCP: Alva Garnet., MD  Assessment/Plan: Principal Problem:   Acute CVA (cerebrovascular accident) Active Problems:   Peripheral vascular disease, unspecified   History of CVA (cerebrovascular accident)   Hypertension   DM type 2, uncontrolled, with renal complications   CKD (chronic kidney disease), stage III   Acute on chronic renal failure   Metabolic encephalopathy   Dehydration   Patient non adherence    73 year old female with a past medical history of hypertension, diabetes type 2, hypothyroidism presented with altered mental status on 6/18.in to ER and was noted to be encephalopathic with increased somnolence. CT showed age indeterminate CVA .Lab work was c/w acute renal failure with elevation in cr from 1.8 to 2.6. Patient appeared hemoconcentrated with poor PO intake per family. ABG was attempted but patient was not cooperative.  patient was transferred to step down yesterday because of somnolence  HOSPITAL COURSE:  Unresponsive episode 6/26, Workup thus far negative CT scan stable, cannot rule out another TIA Seen by neurology, they recommend to continue Plavix and Lipitor Aggressive blood pressure control  Metabolic encephalopathy/ History of CVA/acute CVA  -MRI showing acute stroke see results below  -Secondary to ARF, dehydration, and acute CVA as etiology  -UA unremarkable  -SLP plan ; continue Dysphagia 1 (puree);Nectar-thick liquid Liquids provided via: Cup;No straw  -PT /OT; recommends SNF; supervision/assistance 24-hour; patient has a bed at [facility]  -6/26 Psychiatry reconsulted States that the patient does meet criteria for capacity to make her own medical decisions  [facility]charge [facility] [place] [facility] possibly Monday   Acute infarct right anterior basal ganglia w/ prior hx of CVA  -MRI revealed an acute stroke - see results below  -failed initial swallow  eval at bedside , however on 6/20 passed swallow eval  -underwent recent extensive CVA w/u, to include TEE and loop recorder - was started on Plavix at that time  -Dr. Joseph Art has indicated that Cardiology has been consulted and did evaluate the loop recorder  -NSR on monitor - resume Plavix as cleared for diet 6/20  Neurology consulted Dr. Roseanne Reno prior to discharge, today and they will make final recommendations    Hypertension, improving Uncontrolled in the setting of acute renal failure therefore Lasix and lisinopril and HCTZ were discontinued  Has been transitioned to Coreg 25 mg twice a day, hydralazine, lisinopril resumed  HCTZ has been discontinued  Hydralazine has been increased to 50 mg 3 times a day    DM type 2, uncontrolled, with renal complications  6/19 hemoglobin A1c= 8.9  -Lipid panel within ADA guidelines  -Continue Lantus to 14 units QHS    Acute on chronic renal failure/CKD (chronic kidney disease), stage III-prerenal  Secondary to diuretics  HCTZ discontinued, Lasix and ACE inhibitor was held but resume prior to discharge  Patient will need a repeat BMP on a weekly basis  pt had been instructed to stop meloxicam and naproxen at discharge last admit but med rec shows her to still be taking  -6/25 Cr 1.12   Hypothyroidism  -cont Synthroid 25 mcg daily      DVT prophylaxis: SCDs  Code Status: Full  Family Communication: Husband at bedside  Disposition Plan/Expected LOS: SNF on Monday  Consultants:  Neurology Psychiatry  Procedure/Significant Events:  6/18 CXR; No active cardiopulmonary disease  6/19 CT head without contrast; Age indeterminate focal hypodensity w/i Rt caudate head/anterior limb of the right internal capsule. No  intracranial hemorrhage.  6/19 MRI/MRA brain without contrast; Acute infarct right anterior basal ganglia  -Atrophy with moderate to advanced chronic microvascular ischemia.  -Numerous areas chronic micro hemorrhage in brain  consistent with hypertension  Culture  6/19 MRSA by PCR negative  Antibiotics:  NA  DVT prophylaxis:  SCD  Devices  NA  LINES / TUBES:  6/18 20ga left wrist     HPI/Subjective: Patient awake and alert, stable overnight, blood pressure improved this morning  Objective: Filed Vitals:   05/02/14 0200 05/02/14 0300 05/02/14 0433 05/02/14 0650  BP: 156/65 152/60 166/60 156/51  Pulse: 69 66 66 68  Temp: 98.5 F (36.9 C)  98.6 F (37 C)   TempSrc: Oral  Axillary   Resp: 24 24 18 20   Height:      Weight:      SpO2: 100% 100% 100% 96%    Intake/Output Summary (Last 24 hours) at 05/02/14 0814 Last data filed at 05/01/14 2243  Gross per 24 hour  Intake    123 ml  Output    150 ml  Net    -27 ml    Exam:  HENT:  Head: Atraumatic.  Nose: Nose normal.  Mouth/Throat: Oropharynx is clear and moist.  Eyes: Conjunctivae are normal. Pupils are equal, round, and reactive to light. No scleral icterus.  Neck: Neck supple. No tracheal deviation present.  Cardiovascular: Normal rate, regular rhythm, normal heart sounds and intact distal pulses.  Pulmonary/Chest: Effort normal and breath sounds normal. No respiratory distress.  Abdominal: Soft. Normal appearance and bowel sounds are normal. She exhibits no distension. There is no tenderness.  Musculoskeletal: She exhibits no edema and no tenderness.  Neurological: She is alert. No cranial nerve deficit.    Data Reviewed: Basic Metabolic Panel:  Recent Labs Lab 04/26/14 1633 04/27/14 0200 04/28/14 0245 04/28/14 0947 04/29/14 0243 04/30/14 05/02/14 0250  NA 142 143 143  --   --  142 146  K 3.8 2.8* 3.8  --   --  4.2 4.0  CL 107 106 108  --   --  105 107  CO2 18* 23 21  --   --  24 27  GLUCOSE 187* 215* 261*  --   --  152* 105*  BUN 10 8 7   --   --  9 15  CREATININE 1.08 1.18* 1.13*  --   --  1.12* 1.21*  CALCIUM 8.3* 8.6 8.4  --   --  8.7 8.9  MG 1.4* 1.4*  --  1.5 1.9  --   --     Liver Function Tests: No results  found for this basename: AST, ALT, ALKPHOS, BILITOT, PROT, ALBUMIN,  in the last 168 hours No results found for this basename: LIPASE, AMYLASE,  in the last 168 hours No results found for this basename: AMMONIA,  in the last 168 hours  CBC:  Recent Labs Lab 04/26/14 1633 04/27/14 0200 04/28/14 0245  WBC 5.5 6.2 5.9  HGB 12.1 12.4 11.8*  HCT 35.3* 36.5 34.6*  MCV 86.9 85.9 86.3  PLT 168 234 223    Cardiac Enzymes:  Recent Labs Lab 05/01/14 1250  TROPONINI <0.30   BNP (last 3 results) No results found for this basename: PROBNP,  in the last 8760 hours   CBG:  Recent Labs Lab 04/30/14 2028 05/01/14 0753 05/01/14 1228 05/01/14 1654 05/01/14 2149  GLUCAP 203* 180* 105* 133* 140*    Recent Results (from the past 240 hour(s))  MRSA PCR SCREENING  Status: None   Collection Time    04/24/14  8:50 AM      Result Value Ref Range Status   MRSA by PCR NEGATIVE  NEGATIVE Final   Comment:            The GeneXpert MRSA Assay (FDA     approved for NASAL specimens     only), is one component of a     comprehensive MRSA colonization     surveillance program. It is not     intended to diagnose MRSA     infection nor to guide or     monitor treatment for     MRSA infections.  MRSA PCR SCREENING     Status: None   Collection Time    05/01/14  3:15 PM      Result Value Ref Range Status   MRSA by PCR NEGATIVE  NEGATIVE Final   Comment:            The GeneXpert MRSA Assay (FDA     approved for NASAL specimens     only), is one component of a     comprehensive MRSA colonization     surveillance program. It is not     intended to diagnose MRSA     infection nor to guide or     monitor treatment for     MRSA infections.     Studies: Dg Chest 2 View  04/23/2014   CLINICAL DATA:  Fatigue and lethargy.  Hyperglycemia.  EXAM: CHEST  2 VIEW  COMPARISON:  04/07/2014.  02/05/2013.  FINDINGS: Cardiopericardial silhouette within normal limits. Mediastinal contours normal.  Trachea midline. No airspace disease or effusion. Monitoring leads project over the chest. Mild left glenohumeral joint osteoarthritis. Question prior proximal left humerus fracture. Aortic arch atherosclerosis.  IMPRESSION: No active cardiopulmonary disease.   Electronically Signed   By: Andreas Newport M.D.   On: 04/23/2014 23:30   Ct Head Wo Contrast  05/01/2014   CLINICAL DATA:  Unresponsive.  Evaluate for stroke.  EXAM: CT HEAD WITHOUT CONTRAST  TECHNIQUE: Contiguous axial images were obtained from the base of the skull through the vertex without intravenous contrast.  COMPARISON:  Head MRI and CT 04/24/2014  FINDINGS: Focal hypoattenuation in the anterior right basal ganglia corresponding to acute infarct on MRI does not appear significantly changed. Old lacunar infarcts are again seen in the bilateral basal ganglia, thalami, and cerebellum. There is no evidence of acute intracranial hemorrhage, mass, midline shift, or extra-axial fluid collection. Mild-to-moderate cerebral atrophy is unchanged. Bilateral cerebral white matter hypodensities are similar to the prior study compatible with advanced chronic small vessel ischemic disease.  Prior bilateral cataract extraction as noted. Mastoid air cells and visualized paranasal sinuses are clear.  IMPRESSION: Unchanged appearance of acute, anterior right basal ganglia infarct. No evidence of new intracranial abnormality.   Electronically Signed   By: Sebastian Ache   On: 05/01/2014 14:30   Ct Head Wo Contrast  04/24/2014   CLINICAL DATA:  weakness  EXAM: CT HEAD WITHOUT CONTRAST  TECHNIQUE: Contiguous axial images were obtained from the base of the skull through the vertex without intravenous contrast.  COMPARISON:  Prior MRI from 03/21/2014  FINDINGS: Generalized cerebral atrophy with advanced chronic microvascular ischemic disease is present, similar to prior.  There is increased prominence of focal hypodensity in the region of the right caudate head/anterior  limb of the right internal capsule (series 2, image 13). While this finding may be related to  chronic small vessel ischemic changes, possible acute ischemia is not excluded. No acute intracranial hemorrhage.  No mass lesion or midline shift. Gray-white matter differentiation is well maintained. Ventricles are normal in size without evidence of hydrocephalus. CSF containing spaces are within normal limits. No extra-axial fluid collection.  The calvarium is intact.  Orbital soft tissues are within normal limits.  The paranasal sinuses and mastoid air cells are well pneumatized and free of fluid.  Scalp soft tissues are unremarkable.  IMPRESSION: 1. Age indeterminate focal hypodensity within the right caudate head/anterior limb of the right internal capsule. While this finding may be related to chronic small vessel ischemic changes in this region, possible superimposed acute ischemia is not excluded. Further evaluation with brain MRI could be performed if there is clinical concern for acute ischemic infarct in this region. 2. No intracranial hemorrhage. 3. Generalized cerebral atrophy with advanced chronic microvascular ischemic disease.   Electronically Signed   By: Rise MuBenjamin  McClintock M.D.   On: 04/24/2014 03:03   Mri Brain Without Contrast  04/24/2014   CLINICAL DATA:  Stroke  EXAM: MRI HEAD WITHOUT CONTRAST  MRA HEAD WITHOUT CONTRAST  TECHNIQUE: Multiplanar, multiecho pulse sequences of the brain and surrounding structures were obtained without intravenous contrast. Angiographic images of the head were obtained using MRA technique without contrast.  COMPARISON:  CT 04/24/2014, MRI 03/21/2014  FINDINGS: MRI HEAD FINDINGS  Acute infarct in the right anterior basal ganglia involving the deep white matter structures. No other acute infarct identified.  Generalized atrophy. Moderate chronic microvascular ischemic change throughout the white matter, basal ganglia, thalamic, and pons. Numerous areas of chronic micro  hemorrhage in the brain. No mass or edema.  MRA HEAD FINDINGS  Both vertebral arteries are patent to the basilar. Mild disease in the distal left vertebral artery. The basilar is widely patent. Small PICA patent bilaterally. Small superior cerebellar arteries are patent. Posterior cerebral arteries are patent with a moderate stenosis in the right PCA unchanged from the prior study. Mild disease in the left PCA.  Atherosclerotic disease in the right cavernous carotid with mild to moderate stenosis and vessel irregularity. Right anterior middle cerebral arteries are patent. Mild stenosis in the temporal branch of the right MCA.  Atherosclerotic disease with mild diffuse stenosis in the left cavernous carotid. Left anterior and middle cerebral arteries are patent. Mild atherosclerotic disease in the left middle cerebral artery branches  Negative for cerebral aneurysm.  IMPRESSION: Acute infarct right anterior basal ganglia compared with prior studies.  Atrophy with moderate to advanced chronic microvascular ischemia. Numerous areas of chronic micro hemorrhage in the brain consistent with hypertension  Intracranial atherosclerotic disease is similar to the prior study. No large vessel occlusion is identified.   Electronically Signed   By: Marlan Palauharles  Clark M.D.   On: 04/24/2014 19:48   Dg Chest Port 1 View  05/01/2014   CLINICAL DATA:  Unresponsive.  EXAM: PORTABLE CHEST - 1 VIEW  COMPARISON:  04/21/2014.  FINDINGS: Mediastinum and hilar structures normal. Lungs are clear. Heart size normal. Pacing pads and small monitoring device noted over the chest. No pleural effusion or pneumothorax. No acute bony abnormality.  IMPRESSION: No active disease.   Electronically Signed   By: Maisie Fushomas  Register   On: 05/01/2014 13:35   Dg Chest Portable 1 View  04/07/2014   CLINICAL DATA:  Cough and congestion  EXAM: PORTABLE CHEST - 1 VIEW  COMPARISON:  03/21/2014  FINDINGS: Cardiac shadow is stable. The lungs are well aerated  bilaterally. A small monitoring device is noted no bony abnormality is seen.  IMPRESSION: No acute abnormality is noted.   Electronically Signed   By: Alcide CleverMark  Lukens M.D.   On: 04/07/2014 09:16   Mr Maxine GlennMra Head/brain Wo Cm  04/24/2014   CLINICAL DATA:  Stroke  EXAM: MRI HEAD WITHOUT CONTRAST  MRA HEAD WITHOUT CONTRAST  TECHNIQUE: Multiplanar, multiecho pulse sequences of the brain and surrounding structures were obtained without intravenous contrast. Angiographic images of the head were obtained using MRA technique without contrast.  COMPARISON:  CT 04/24/2014, MRI 03/21/2014  FINDINGS: MRI HEAD FINDINGS  Acute infarct in the right anterior basal ganglia involving the deep white matter structures. No other acute infarct identified.  Generalized atrophy. Moderate chronic microvascular ischemic change throughout the white matter, basal ganglia, thalamic, and pons. Numerous areas of chronic micro hemorrhage in the brain. No mass or edema.  MRA HEAD FINDINGS  Both vertebral arteries are patent to the basilar. Mild disease in the distal left vertebral artery. The basilar is widely patent. Small PICA patent bilaterally. Small superior cerebellar arteries are patent. Posterior cerebral arteries are patent with a moderate stenosis in the right PCA unchanged from the prior study. Mild disease in the left PCA.  Atherosclerotic disease in the right cavernous carotid with mild to moderate stenosis and vessel irregularity. Right anterior middle cerebral arteries are patent. Mild stenosis in the temporal branch of the right MCA.  Atherosclerotic disease with mild diffuse stenosis in the left cavernous carotid. Left anterior and middle cerebral arteries are patent. Mild atherosclerotic disease in the left middle cerebral artery branches  Negative for cerebral aneurysm.  IMPRESSION: Acute infarct right anterior basal ganglia compared with prior studies.  Atrophy with moderate to advanced chronic microvascular ischemia. Numerous areas  of chronic micro hemorrhage in the brain consistent with hypertension  Intracranial atherosclerotic disease is similar to the prior study. No large vessel occlusion is identified.   Electronically Signed   By: Marlan Palauharles  Clark M.D.   On: 04/24/2014 19:48    Scheduled Meds: . antiseptic oral rinse  15 mL Mouth Rinse q12n4p  . atorvastatin  40 mg Oral Daily  . carvedilol  25 mg Oral BID WC  . chlorhexidine  15 mL Mouth Rinse BID  . clopidogrel  75 mg Oral Q breakfast  . feeding supplement (GLUCERNA SHAKE)  237 mL Oral BID BM  . furosemide  40 mg Oral Daily  . hydrALAZINE  15 mg Oral 3 times per day  . insulin aspart  0-20 Units Subcutaneous TID WC  . insulin glargine  14 Units Subcutaneous QHS  . latanoprost  1 drop Both Eyes QHS  . levothyroxine  50 mcg Oral QAC breakfast  . lisinopril  15 mg Oral Daily  . olopatadine  1 drop Both Eyes BID  . potassium chloride SA  20 mEq Oral Daily  . sodium chloride  3 mL Intravenous Q12H   Continuous Infusions:   Principal Problem:   Acute CVA (cerebrovascular accident) Active Problems:   Peripheral vascular disease, unspecified   History of CVA (cerebrovascular accident)   Hypertension   DM type 2, uncontrolled, with renal complications   CKD (chronic kidney disease), stage III   Acute on chronic renal failure   Metabolic encephalopathy   Dehydration   Patient non adherence    Time spent: 40 minutes   Providence HospitalBROL,Makhayla Mcmurry  [facility] Pager [phone]. If 8PM-8AM, please contact night-coverage at [email or web address], password [email or web address] 05/02/2014, 8:14 AM  LOS: 9 days

## 2014-05-03 LAB — GLUCOSE, CAPILLARY
GLUCOSE-CAPILLARY: 136 mg/dL — AB (ref 70–99)
GLUCOSE-CAPILLARY: 165 mg/dL — AB (ref 70–99)
Glucose-Capillary: 141 mg/dL — ABNORMAL HIGH (ref 70–99)

## 2014-05-03 MED ORDER — HYDRALAZINE HCL 50 MG PO TABS
50.0000 mg | ORAL_TABLET | Freq: Three times a day (TID) | ORAL | Status: DC
  Administered 2014-05-03 – 2014-05-04 (×3): 50 mg via ORAL
  Filled 2014-05-03 (×6): qty 1

## 2014-05-03 MED ORDER — POTASSIUM CHLORIDE 20 MEQ/15ML (10%) PO LIQD
20.0000 meq | Freq: Every day | ORAL | Status: DC
  Administered 2014-05-03 – 2014-05-04 (×2): 20 meq via ORAL
  Filled 2014-05-03 (×2): qty 15

## 2014-05-03 NOTE — Progress Notes (Addendum)
[facility]. Nurse informed [facility] nurse of discrepancy with missing Novolog administration requirement for 1200 (pharmacy currently working issue).  Nurse contacted pt son to inform of new room assignment, pt husband is at bedside.

## 2014-05-03 NOTE — Progress Notes (Signed)
[facility] PROGRESS NOTE  [name] FullingBetty J [name] ZOX:[id number] DOB: 02/10/1941 DOA: 04/23/2014 PCP: Alva GarnetSHELTON,[name] R., MD  Assessment/Plan: Principal Problem:   Acute CVA (cerebrovascular accident) Active Problems:   Peripheral vascular disease, unspecified   History of CVA (cerebrovascular accident)   Hypertension   DM type 2, uncontrolled, with renal complications   CKD (chronic kidney disease), stage III   Acute on chronic renal failure   Metabolic encephalopathy   Dehydration   Patient non adherence    73 year old female with a past medical history of hypertension, diabetes type 2, hypothyroidism presented with altered mental status on 6/18.in to ER and was noted to be encephalopathic with increased somnolence. CT showed age indeterminate CVA .Lab work was c/w acute renal failure with elevation in cr from 1.8 to 2.6. Patient appeared hemoconcentrated with poor PO intake per family. ABG was attempted but patient was not cooperative.  patient was transferred to step down yesterday because of somnolence    HOSPITAL COURSE:  Unresponsive episode 6/26,  Workup thus far negative  , neurologically stable since 6/26 CT scan stable, cannot rule out another TIA  Seen by neurology, they recommend to continue Plavix and Lipitor  Aggressive blood pressure control    Metabolic encephalopathy/ History of CVA/acute CVA  -MRI showing acute stroke see results below  -Secondary to ARF, dehydration, and acute CVA as etiology  -UA unremarkable  -SLP plan ; continue Dysphagia 1 (puree);Nectar-thick liquid/ Liquids provided via: Cup;No straw  -PT /OT; recommends SNF; supervision/assistance 24-hour; [facility]  -6/26 Psychiatry reconsulted States that the patient does meet criteria for capacity to make her own medical decisions  [facility]    Acute infarct right anterior basal ganglia w/ prior hx of CVA  -MRI revealed an acute stroke  - see results below  -failed initial swallow eval at bedside , however on 6/20 passed swallow eval  -underwent recent extensive CVA w/u, to include TEE and loop recorder - was started on Plavix at that time  -Dr. Joseph [name] has indicated that Cardiology has been consulted and did evaluate the loop recorder  -NSR on monitor - resume Plavix as cleared for diet 6/20  Neurology consulted Dr. Roseanne [name] prior to discharge, today and they will make final recommendations   Hypertension, uncontrolled last night Uncontrolled in the setting of acute renal failure therefore Lasix and lisinopril and HCTZ were discontinued  Has been transitioned to Coreg 25 mg twice a day, hydralazine, lisinopril resumed  HCTZ has been discontinued  Hydralazine has been increased to 50 mg 3 times a day    DM type 2, uncontrolled, with renal complications  6/19 hemoglobin A1c= 8.9  -Lipid panel within ADA guidelines  -Continue Lantus to 14 units QHS    Acute on chronic renal failure/CKD (chronic kidney disease), stage III-prerenal  Secondary to diuretics  HCTZ discontinued, Lasix and ACE inhibitor was held but resume prior to discharge  Patient will need a repeat BMP on a weekly basis  pt had been instructed to stop meloxicam and naproxen at discharge last admit but med rec shows her to still be taking  -6/25 Cr 1.12    Hypothyroidism  -cont Synthroid 50 mcg daily    DVT prophylaxis: SCDs  Code Status: Full  Family Communication: Husband at bedside  Disposition Plan/Expected LOS: SNF on Monday    Consultants:  Neurology  Psychiatry  Procedure/Significant Events:  6/18 CXR; No active cardiopulmonary disease  6/19 CT head  without contrast; Age indeterminate focal hypodensity w/i Rt caudate head/anterior limb of the right internal capsule. No intracranial hemorrhage.  6/19 MRI/MRA brain without contrast; Acute infarct right anterior basal ganglia  -Atrophy with moderate to advanced chronic microvascular ischemia.   -Numerous areas chronic micro hemorrhage in brain consistent with hypertension  Culture  6/19 MRSA by PCR negative  Antibiotics:  NA  DVT prophylaxis:  SCD  Devices  NA  LINES / TUBES:  6/18 20ga left wrist       HPI/Subjective: Rapid response call last night for deviation of gaze to the left, however neuro exam was found to be stable, hypertensive overnight, alert and conversant this morning  Objective: Filed Vitals:   05/03/14 0516 05/03/14 0519 05/03/14 0600 05/03/14 0700  BP:  178/45 177/69 155/51  Pulse:   75 76  Temp:      TempSrc:      Resp:   29 22  Height:      Weight: 72.9 kg (160 lb 11.5 oz)     SpO2:   95% 94%    Intake/Output Summary (Last 24 hours) at 05/03/14 0829 Last data filed at 05/03/14 0516  Gross per 24 hour  Intake    603 ml  Output    500 ml  Net    103 ml    Exam:  cardiovascular: Normal rate, regular rhythm, normal heart sounds and intact distal pulses.  Pulmonary/Chest: Effort normal and breath sounds normal. No respiratory distress.  Abdominal: Soft. Normal appearance and bowel sounds are normal. She exhibits no distension. There is no tenderness.  Musculoskeletal: She exhibits no edema and no tenderness.  Neurological: She is alert. No cranial nerve deficit.    Data Reviewed: Basic Metabolic Panel:  Recent Labs Lab 04/26/14 1633 04/27/14 0200 04/28/14 0245 04/28/14 0947 04/29/14 0243 04/30/14 05/02/14 0250  NA 142 143 143  --   --  142 146  K 3.8 2.8* 3.8  --   --  4.2 4.0  CL 107 106 108  --   --  105 107  CO2 18* 23 21  --   --  24 27  GLUCOSE 187* 215* 261*  --   --  152* 105*  BUN 10 8 7   --   --  9 15  CREATININE 1.08 1.18* 1.13*  --   --  1.12* 1.21*  CALCIUM 8.3* 8.6 8.4  --   --  8.7 8.9  MG 1.4* 1.4*  --  1.5 1.9  --   --     Liver Function Tests: No results found for this basename: AST, ALT, ALKPHOS, BILITOT, PROT, ALBUMIN,  in the last 168 hours No results found for this basename: LIPASE, AMYLASE,  in  the last 168 hours No results found for this basename: AMMONIA,  in the last 168 hours  CBC:  Recent Labs Lab 04/26/14 1633 04/27/14 0200 04/28/14 0245  WBC 5.5 6.2 5.9  HGB 12.1 12.4 11.8*  HCT 35.3* 36.5 34.6*  MCV 86.9 85.9 86.3  PLT 168 234 223    Cardiac Enzymes:  Recent Labs Lab 05/01/14 1250  TROPONINI <0.30   BNP (last 3 results) No results found for this basename: PROBNP,  in the last 8760 hours   CBG:  Recent Labs Lab 05/01/14 2149 05/02/14 0819 05/02/14 1154 05/02/14 1606 05/02/14 2130  GLUCAP 140* 87 121* 161* 149*    Recent Results (from the past 240 hour(s))  MRSA PCR SCREENING     Status: None  Collection Time    04/24/14  8:50 AM      Result Value Ref Range Status   MRSA by PCR NEGATIVE  NEGATIVE Final   Comment:            The GeneXpert MRSA Assay (FDA     approved for NASAL specimens     only), is one component of a     comprehensive MRSA colonization     surveillance program. It is not     intended to diagnose MRSA     infection nor to guide or     monitor treatment for     MRSA infections.  MRSA PCR SCREENING     Status: None   Collection Time    05/01/14  3:15 PM      Result Value Ref Range Status   MRSA by PCR NEGATIVE  NEGATIVE Final   Comment:            The GeneXpert MRSA Assay (FDA     approved for NASAL specimens     only), is one component of a     comprehensive MRSA colonization     surveillance program. It is not     intended to diagnose MRSA     infection nor to guide or     monitor treatment for     MRSA infections.     Studies: Dg Chest 2 View  04/23/2014   CLINICAL DATA:  Fatigue and lethargy.  Hyperglycemia.  EXAM: CHEST  2 VIEW  COMPARISON:  04/07/2014.  02/05/2013.  FINDINGS: Cardiopericardial silhouette within normal limits. Mediastinal contours normal. Trachea midline. No airspace disease or effusion. Monitoring leads project over the chest. Mild left glenohumeral joint osteoarthritis. Question prior  proximal left humerus fracture. Aortic arch atherosclerosis.  IMPRESSION: No active cardiopulmonary disease.   Electronically Signed   By: Andreas NewportGeoffrey  Lamke M.D.   On: 04/23/2014 23:30   Ct Head Wo Contrast  05/01/2014   CLINICAL DATA:  Unresponsive.  Evaluate for stroke.  EXAM: CT HEAD WITHOUT CONTRAST  TECHNIQUE: Contiguous axial images were obtained from the base of the skull through the vertex without intravenous contrast.  COMPARISON:  Head MRI and CT 04/24/2014  FINDINGS: Focal hypoattenuation in the anterior right basal ganglia corresponding to acute infarct on MRI does not appear significantly changed. Old lacunar infarcts are again seen in the bilateral basal ganglia, thalami, and cerebellum. There is no evidence of acute intracranial hemorrhage, mass, midline shift, or extra-axial fluid collection. Mild-to-moderate cerebral atrophy is unchanged. Bilateral cerebral white matter hypodensities are similar to the prior study compatible with advanced chronic small vessel ischemic disease.  Prior bilateral cataract extraction as noted. Mastoid air cells and visualized paranasal sinuses are clear.  IMPRESSION: Unchanged appearance of acute, anterior right basal ganglia infarct. No evidence of new intracranial abnormality.   Electronically Signed   By: Sebastian AcheAllen  Grady   On: 05/01/2014 14:30   Ct Head Wo Contrast  04/24/2014   CLINICAL DATA:  weakness  EXAM: CT HEAD WITHOUT CONTRAST  TECHNIQUE: Contiguous axial images were obtained from the base of the skull through the vertex without intravenous contrast.  COMPARISON:  Prior MRI from 03/21/2014  FINDINGS: Generalized cerebral atrophy with advanced chronic microvascular ischemic disease is present, similar to prior.  There is increased prominence of focal hypodensity in the region of the right caudate head/anterior limb of the right internal capsule (series 2, image 13). While this finding may be related to chronic small vessel ischemic changes,  possible acute  ischemia is not excluded. No acute intracranial hemorrhage.  No mass lesion or midline shift. Gray-white matter differentiation is well maintained. Ventricles are normal in size without evidence of hydrocephalus. CSF containing spaces are within normal limits. No extra-axial fluid collection.  The calvarium is intact.  Orbital soft tissues are within normal limits.  The paranasal sinuses and mastoid air cells are well pneumatized and free of fluid.  Scalp soft tissues are unremarkable.  IMPRESSION: 1. Age indeterminate focal hypodensity within the right caudate head/anterior limb of the right internal capsule. While this finding may be related to chronic small vessel ischemic changes in this region, possible superimposed acute ischemia is not excluded. Further evaluation with brain MRI could be performed if there is clinical concern for acute ischemic infarct in this region. 2. No intracranial hemorrhage. 3. Generalized cerebral atrophy with advanced chronic microvascular ischemic disease.   Electronically Signed   By: Rise Mu M.D.   On: 04/24/2014 03:03   Mri Brain Without Contrast  04/24/2014   CLINICAL DATA:  Stroke  EXAM: MRI HEAD WITHOUT CONTRAST  MRA HEAD WITHOUT CONTRAST  TECHNIQUE: Multiplanar, multiecho pulse sequences of the brain and surrounding structures were obtained without intravenous contrast. Angiographic images of the head were obtained using MRA technique without contrast.  COMPARISON:  CT 04/24/2014, MRI 03/21/2014  FINDINGS: MRI HEAD FINDINGS  Acute infarct in the right anterior basal ganglia involving the deep white matter structures. No other acute infarct identified.  Generalized atrophy. Moderate chronic microvascular ischemic change throughout the white matter, basal ganglia, thalamic, and pons. Numerous areas of chronic micro hemorrhage in the brain. No mass or edema.  MRA HEAD FINDINGS  Both vertebral arteries are patent to the basilar. Mild disease in the distal left  vertebral artery. The basilar is widely patent. Small PICA patent bilaterally. Small superior cerebellar arteries are patent. Posterior cerebral arteries are patent with a moderate stenosis in the right PCA unchanged from the prior study. Mild disease in the left PCA.  Atherosclerotic disease in the right cavernous carotid with mild to moderate stenosis and vessel irregularity. Right anterior middle cerebral arteries are patent. Mild stenosis in the temporal branch of the right MCA.  Atherosclerotic disease with mild diffuse stenosis in the left cavernous carotid. Left anterior and middle cerebral arteries are patent. Mild atherosclerotic disease in the left middle cerebral artery branches  Negative for cerebral aneurysm.  IMPRESSION: Acute infarct right anterior basal ganglia compared with prior studies.  Atrophy with moderate to advanced chronic microvascular ischemia. Numerous areas of chronic micro hemorrhage in the brain consistent with hypertension  Intracranial atherosclerotic disease is similar to the prior study. No large vessel occlusion is identified.   Electronically Signed   By: Marlan Palau M.D.   On: 04/24/2014 19:48   Dg Chest Port 1 View  05/01/2014   CLINICAL DATA:  Unresponsive.  EXAM: PORTABLE CHEST - 1 VIEW  COMPARISON:  04/21/2014.  FINDINGS: Mediastinum and hilar structures normal. Lungs are clear. Heart size normal. Pacing pads and small monitoring device noted over the chest. No pleural effusion or pneumothorax. No acute bony abnormality.  IMPRESSION: No active disease.   Electronically Signed   By: Maisie Fus  Register   On: 05/01/2014 13:35   Dg Chest Portable 1 View  04/07/2014   CLINICAL DATA:  Cough and congestion  EXAM: PORTABLE CHEST - 1 VIEW  COMPARISON:  03/21/2014  FINDINGS: Cardiac shadow is stable. The lungs are well aerated bilaterally. A small monitoring device  is noted no bony abnormality is seen.  IMPRESSION: No acute abnormality is noted.   Electronically Signed   By:  Alcide Clever M.D.   On: 04/07/2014 09:16   Mr Maxine Glenn Head/brain Wo Cm  04/24/2014   CLINICAL DATA:  Stroke  EXAM: MRI HEAD WITHOUT CONTRAST  MRA HEAD WITHOUT CONTRAST  TECHNIQUE: Multiplanar, multiecho pulse sequences of the brain and surrounding structures were obtained without intravenous contrast. Angiographic images of the head were obtained using MRA technique without contrast.  COMPARISON:  CT 04/24/2014, MRI 03/21/2014  FINDINGS: MRI HEAD FINDINGS  Acute infarct in the right anterior basal ganglia involving the deep white matter structures. No other acute infarct identified.  Generalized atrophy. Moderate chronic microvascular ischemic change throughout the white matter, basal ganglia, thalamic, and pons. Numerous areas of chronic micro hemorrhage in the brain. No mass or edema.  MRA HEAD FINDINGS  Both vertebral arteries are patent to the basilar. Mild disease in the distal left vertebral artery. The basilar is widely patent. Small PICA patent bilaterally. Small superior cerebellar arteries are patent. Posterior cerebral arteries are patent with a moderate stenosis in the right PCA unchanged from the prior study. Mild disease in the left PCA.  Atherosclerotic disease in the right cavernous carotid with mild to moderate stenosis and vessel irregularity. Right anterior middle cerebral arteries are patent. Mild stenosis in the temporal branch of the right MCA.  Atherosclerotic disease with mild diffuse stenosis in the left cavernous carotid. Left anterior and middle cerebral arteries are patent. Mild atherosclerotic disease in the left middle cerebral artery branches  Negative for cerebral aneurysm.  IMPRESSION: Acute infarct right anterior basal ganglia compared with prior studies.  Atrophy with moderate to advanced chronic microvascular ischemia. Numerous areas of chronic micro hemorrhage in the brain consistent with hypertension  Intracranial atherosclerotic disease is similar to the prior study. No large  vessel occlusion is identified.   Electronically Signed   By: Marlan Palau M.D.   On: 04/24/2014 19:48    Scheduled Meds: . antiseptic oral rinse  15 mL Mouth Rinse q12n4p  . atorvastatin  40 mg Oral Daily  . carvedilol  25 mg Oral BID WC  . chlorhexidine  15 mL Mouth Rinse BID  . clopidogrel  75 mg Oral Q breakfast  . feeding supplement (GLUCERNA SHAKE)  237 mL Oral BID BM  . furosemide  40 mg Oral Daily  . hydrALAZINE  50 mg Oral 3 times per day  . insulin aspart  0-20 Units Subcutaneous TID WC  . insulin glargine  14 Units Subcutaneous QHS  . latanoprost  1 drop Both Eyes QHS  . levothyroxine  50 mcg Oral QAC breakfast  . lisinopril  15 mg Oral Daily  . olopatadine  1 drop Both Eyes BID  . potassium chloride SA  20 mEq Oral Daily  . sodium chloride  3 mL Intravenous Q12H   Continuous Infusions:   Principal Problem:   Acute CVA (cerebrovascular accident) Active Problems:   Peripheral vascular disease, unspecified   History of CVA (cerebrovascular accident)   Hypertension   DM type 2, uncontrolled, with renal complications   CKD (chronic kidney disease), stage III   Acute on chronic renal failure   Metabolic encephalopathy   Dehydration   Patient non adherence    Time spent: 40 minutes   [facility]  [facility] Pager [phone]. If 8PM-8AM, please contact night-coverage at [email or web address], password [facility] 05/03/2014, 8:29 AM  LOS: 10 days

## 2014-05-03 NOTE — Progress Notes (Signed)
Pt arrived on the unit.  Oriented and welcomed to the unit.  VSS, pt settled into bed, call bell within reach. Sondra ComeSilva, LaToya M, RN 05/03/2014

## 2014-05-03 NOTE — Progress Notes (Signed)
In to obtain VS and administer Apresoline 10mg  for hypertension. Found pt looking up towards left. NIHSS performed NO changes. Neck stiff. Pt denies c/o pain. Speech clear. Pt able to MAE and follow commands appropriately. Rapid Response called to evaluate pt as well.

## 2014-05-03 NOTE — Significant Event (Signed)
Rapid Response Event Note  Overview: Time Called: 0040 Arrival Time: 0040 Event Type: Neurologic  Initial Focused Assessment: Asked by patient's bedside RN to assess patient due to patient staring off to the left. Upon entering patient's room, patient lying in bed noted to be staring to the left. Patient was alert and oriented answering questions appropriately with clear speech. Patient was able to follow all commands as well as move all extremities. Upon command, patient would turn head to the right and make eye contact with me. Patient with complaints of blurred vision however verbalized to myself and bedside nurse that she has experienced blurred vision recently.    Interventions:   Event Summary:  at     at    Outcome:  (2C16)  Event End Time: 0050  Jola SchmidtParker, Angel Jeanene

## 2014-05-04 LAB — COMPREHENSIVE METABOLIC PANEL
ALT: 8 U/L (ref 0–35)
AST: 12 U/L (ref 0–37)
Albumin: 2.7 g/dL — ABNORMAL LOW (ref 3.5–5.2)
Alkaline Phosphatase: 67 U/L (ref 39–117)
BUN: 13 mg/dL (ref 6–23)
CALCIUM: 9 mg/dL (ref 8.4–10.5)
CO2: 28 mEq/L (ref 19–32)
Chloride: 105 mEq/L (ref 96–112)
Creatinine, Ser: 1.16 mg/dL — ABNORMAL HIGH (ref 0.50–1.10)
GFR calc non Af Amer: 45 mL/min — ABNORMAL LOW (ref >90)
GFR, EST AFRICAN AMERICAN: 52 mL/min — AB (ref >90)
GLUCOSE: 105 mg/dL — AB (ref 70–99)
POTASSIUM: 3.6 meq/L — AB (ref 3.7–5.3)
Sodium: 146 mEq/L (ref 137–147)
TOTAL PROTEIN: 6.3 g/dL (ref 6.0–8.3)
Total Bilirubin: 0.3 mg/dL (ref 0.3–1.2)

## 2014-05-04 LAB — GLUCOSE, CAPILLARY
GLUCOSE-CAPILLARY: 104 mg/dL — AB (ref 70–99)
Glucose-Capillary: 232 mg/dL — ABNORMAL HIGH (ref 70–99)

## 2014-05-04 LAB — CBC
HCT: 32.6 % — ABNORMAL LOW (ref 36.0–46.0)
HEMOGLOBIN: 10.6 g/dL — AB (ref 12.0–15.0)
MCH: 29.1 pg (ref 26.0–34.0)
MCHC: 32.5 g/dL (ref 30.0–36.0)
MCV: 89.6 fL (ref 78.0–100.0)
Platelets: 302 10*3/uL (ref 150–400)
RBC: 3.64 MIL/uL — ABNORMAL LOW (ref 3.87–5.11)
RDW: 13 % (ref 11.5–15.5)
WBC: 5.4 10*3/uL (ref 4.0–10.5)

## 2014-05-04 LAB — TSH: TSH: 1.55 u[IU]/mL (ref 0.350–4.500)

## 2014-05-04 MED ORDER — HYDRALAZINE HCL 50 MG PO TABS: 75.0000 mg | ORAL_TABLET | Freq: Three times a day (TID) | ORAL | Status: DC

## 2014-05-04 MED ORDER — INSULIN ASPART 100 UNIT/ML ~~LOC~~ SOLN: 0.0000 [IU] | Freq: Three times a day (TID) | SUBCUTANEOUS | Status: DC

## 2014-05-04 MED ORDER — HYDRALAZINE HCL 50 MG PO TABS
75.0000 mg | ORAL_TABLET | Freq: Three times a day (TID) | ORAL | Status: DC
  Administered 2014-05-04: 75 mg via ORAL
  Filled 2014-05-04 (×2): qty 1

## 2014-05-04 MED ORDER — LISINOPRIL 10 MG PO TABS: 10.0000 mg | ORAL_TABLET | Freq: Every day | ORAL | Status: DC

## 2014-05-04 NOTE — Progress Notes (Signed)
[place] from [employer]: [id number]. Following for discharge summary and will send this to facility once it is complete.   Maryclare LabradorJulie Anderson, MSW, [facility] Clinical Social Worker [phone]

## 2014-05-04 NOTE — Discharge Summary (Addendum)
Physician Discharge Summary  [name] [name]  MRN: [id number]  DOB/AGE: Oct 11, 1941 73 y.o.  PCP: Salena Saner., MD  Admit date: 04/23/2014  Discharge date: 05/04/2014  Discharge Diagnoses:  :  Acute CVA (cerebrovascular accident)  Acute infarct right anterior basal ganglia  Peripheral vascular disease, unspecified  History of CVA (cerebrovascular accident)  Hypertension  DM type 2, uncontrolled, with renal complications  CKD (chronic kidney disease), stage III  Acute on chronic renal failure  Metabolic encephalopathy  Dehydration  Patient non adherence   Followup recommendations Follow up with PCP in 7-10 days Followup with Dr. Leonie Man, [facility] neurology in one to 2 months Dysphagia 1 (puree);Nectar-thick liquid Liquids Repeat BMP in one week   Medication List    STOP taking these medications       hydrochlorothiazide 25 MG tablet  Commonly known as:  HYDRODIURIL     metFORMIN 1000 MG tablet  Commonly known as:  GLUCOPHAGE     metoprolol succinate 25 MG 24 hr tablet  Commonly known as:  TOPROL-XL     naproxen 500 MG tablet  Commonly known as:  NAPROSYN      TAKE these medications       ALLERGY RELIEF PO  Take 1 tablet by mouth daily as needed (for allergies).     atorvastatin 40 MG tablet  Commonly known as:  LIPITOR  Take 40 mg by mouth daily.     bimatoprost 0.01 % Soln  Commonly known as:  LUMIGAN  Place 1 drop into both eyes at bedtime.     carvedilol 25 MG tablet  Commonly known as:  COREG  Take 1 tablet (25 mg total) by mouth 2 (two) times daily with a meal.     clopidogrel 75 MG tablet  Commonly known as:  PLAVIX  Take 1 tablet (75 mg total) by mouth daily with breakfast.     furosemide 20 MG tablet  Commonly known as:  LASIX  Take 40 mg by mouth daily.     gabapentin 300 MG capsule  Commonly known as:  NEURONTIN  Take 1 capsule (300 mg total) by mouth 2 (two) times daily.     guaiFENesin-dextromethorphan 100-10 MG/5ML syrup  Commonly  known as:  ROBITUSSIN DM  Take 5 mLs by mouth every 4 (four) hours as needed for cough.     hydrALAZINE 50 MG tablet  Commonly known as:  APRESOLINE  Take 1.5 tablets (75 mg total) by mouth 3 (three) times daily.     insulin aspart 100 UNIT/ML injection  Commonly known as:  novoLOG  Inject 0-20 Units into the skin 3 (three) times daily with meals.     insulin NPH-regular Human (70-30) 100 UNIT/ML injection  Commonly known as:  NOVOLIN 70/30  Inject 15 Units into the skin 2 (two) times daily with a meal.     levothyroxine 50 MCG tablet  Commonly known as:  SYNTHROID, LEVOTHROID  Take 1 tablet (50 mcg total) by mouth daily.     lisinopril 10 MG tablet  Commonly known as:  PRINIVIL,ZESTRIL  Take 1 tablet (10 mg total) by mouth daily.     Olopatadine HCl 0.2 % Soln  Commonly known as:  PATADAY  Place 1 drop into both eyes daily.     omeprazole 40 MG capsule  Commonly known as:  PRILOSEC  Take 1 capsule (40 mg total) by mouth daily.     potassium chloride SA 20 MEQ tablet  Commonly known as:  K-DUR,KLOR-CON  Take 1  tablet (20 mEq total) by mouth daily.     Vitamin D3 2000 UNITS Tabs  Take 2,000 Units by mouth daily.              Consults:  Neurology  Significant Diagnostic Studies:  Dg Chest 2 View  04/23/2014 CLINICAL DATA: Fatigue and lethargy. Hyperglycemia. EXAM: CHEST 2 VIEW COMPARISON: 04/07/2014. 02/05/2013. FINDINGS: Cardiopericardial silhouette within normal limits. Mediastinal contours normal. Trachea midline. No airspace disease or effusion. Monitoring leads project over the chest. Mild left glenohumeral joint osteoarthritis. Question prior proximal left humerus fracture. Aortic arch atherosclerosis. IMPRESSION: No active cardiopulmonary disease. Electronically Signed By: Dereck Ligas M.D. On: 04/23/2014 23:30  Ct Head Wo Contrast  04/24/2014 CLINICAL DATA: weakness EXAM: CT HEAD WITHOUT CONTRAST TECHNIQUE: Contiguous axial images were obtained from the base  of the skull through the vertex without intravenous contrast. COMPARISON: Prior MRI from 03/21/2014 FINDINGS: Generalized cerebral atrophy with advanced chronic microvascular ischemic disease is present, similar to prior. There is increased prominence of focal hypodensity in the region of the right caudate head/anterior limb of the right internal capsule (series 2, image 13). While this finding may be related to chronic small vessel ischemic changes, possible acute ischemia is not excluded. No acute intracranial hemorrhage. No mass lesion or midline shift. Gray-white matter differentiation is well maintained. Ventricles are normal in size without evidence of hydrocephalus. CSF containing spaces are within normal limits. No extra-axial fluid collection. The calvarium is intact. Orbital soft tissues are within normal limits. The paranasal sinuses and mastoid air cells are well pneumatized and free of fluid. Scalp soft tissues are unremarkable. IMPRESSION: 1. Age indeterminate focal hypodensity within the right caudate head/anterior limb of the right internal capsule. While this finding may be related to chronic small vessel ischemic changes in this region, possible superimposed acute ischemia is not excluded. Further evaluation with brain MRI could be performed if there is clinical concern for acute ischemic infarct in this region. 2. No intracranial hemorrhage. 3. Generalized cerebral atrophy with advanced chronic microvascular ischemic disease. Electronically Signed By: Jeannine Boga M.D. On: 04/24/2014 03:03  Mri Brain Without Contrast  04/24/2014 CLINICAL DATA: Stroke EXAM: MRI HEAD WITHOUT CONTRAST MRA HEAD WITHOUT CONTRAST TECHNIQUE: Multiplanar, multiecho pulse sequences of the brain and surrounding structures were obtained without intravenous contrast. Angiographic images of the head were obtained using MRA technique without contrast. COMPARISON: CT 04/24/2014, MRI 03/21/2014 FINDINGS: MRI HEAD FINDINGS  Acute infarct in the right anterior basal ganglia involving the deep white matter structures. No other acute infarct identified. Generalized atrophy. Moderate chronic microvascular ischemic change throughout the white matter, basal ganglia, thalamic, and pons. Numerous areas of chronic micro hemorrhage in the brain. No mass or edema. MRA HEAD FINDINGS Both vertebral arteries are patent to the basilar. Mild disease in the distal left vertebral artery. The basilar is widely patent. Small PICA patent bilaterally. Small superior cerebellar arteries are patent. Posterior cerebral arteries are patent with a moderate stenosis in the right PCA unchanged from the prior study. Mild disease in the left PCA. Atherosclerotic disease in the right cavernous carotid with mild to moderate stenosis and vessel irregularity. Right anterior middle cerebral arteries are patent. Mild stenosis in the temporal branch of the right MCA. Atherosclerotic disease with mild diffuse stenosis in the left cavernous carotid. Left anterior and middle cerebral arteries are patent. Mild atherosclerotic disease in the left middle cerebral artery branches Negative for cerebral aneurysm. IMPRESSION: Acute infarct right anterior basal ganglia compared with prior studies. Atrophy with moderate  to advanced chronic microvascular ischemia. Numerous areas of chronic micro hemorrhage in the brain consistent with hypertension Intracranial atherosclerotic disease is similar to the prior study. No large vessel occlusion is identified. Electronically Signed By: Franchot Gallo M.D. On: 04/24/2014 19:48  Dg Chest Portable 1 View  04/07/2014 CLINICAL DATA: Cough and congestion EXAM: PORTABLE CHEST - 1 VIEW COMPARISON: 03/21/2014 FINDINGS: Cardiac shadow is stable. The lungs are well aerated bilaterally. A small monitoring device is noted no bony abnormality is seen. IMPRESSION: No acute abnormality is noted. Electronically Signed By: Inez Catalina M.D. On: 04/07/2014 09:16   Mr Jodene Nam Head/brain Wo Cm  04/24/2014 CLINICAL DATA: Stroke EXAM: MRI HEAD WITHOUT CONTRAST MRA HEAD WITHOUT CONTRAST TECHNIQUE: Multiplanar, multiecho pulse sequences of the brain and surrounding structures were obtained without intravenous contrast. Angiographic images of the head were obtained using MRA technique without contrast. COMPARISON: CT 04/24/2014, MRI 03/21/2014 FINDINGS: MRI HEAD FINDINGS Acute infarct in the right anterior basal ganglia involving the deep white matter structures. No other acute infarct identified. Generalized atrophy. Moderate chronic microvascular ischemic change throughout the white matter, basal ganglia, thalamic, and pons. Numerous areas of chronic micro hemorrhage in the brain. No mass or edema. MRA HEAD FINDINGS Both vertebral arteries are patent to the basilar. Mild disease in the distal left vertebral artery. The basilar is widely patent. Small PICA patent bilaterally. Small superior cerebellar arteries are patent. Posterior cerebral arteries are patent with a moderate stenosis in the right PCA unchanged from the prior study. Mild disease in the left PCA. Atherosclerotic disease in the right cavernous carotid with mild to moderate stenosis and vessel irregularity. Right anterior middle cerebral arteries are patent. Mild stenosis in the temporal branch of the right MCA. Atherosclerotic disease with mild diffuse stenosis in the left cavernous carotid. Left anterior and middle cerebral arteries are patent. Mild atherosclerotic disease in the left middle cerebral artery branches Negative for cerebral aneurysm. IMPRESSION: Acute infarct right anterior basal ganglia compared with prior studies. Atrophy with moderate to advanced chronic microvascular ischemia. Numerous areas of chronic micro hemorrhage in the brain consistent with hypertension Intracranial atherosclerotic disease is similar to the prior study. No large vessel occlusion is identified. Electronically Signed By: Franchot Gallo M.D. On: 04/24/2014 19:48  Microbiology:  Recent Results (from the past 240 hour(s))   MRSA PCR SCREENING Status: None    Collection Time    04/24/14 8:50 AM   Result  Value  Ref Range  Status    MRSA by PCR  NEGATIVE  NEGATIVE  Final    Comment:      The GeneXpert MRSA Assay (FDA     approved for NASAL specimens     only), is one component of a     comprehensive MRSA colonization     surveillance program. It is not     intended to diagnose MRSA     infection nor to guide or     monitor treatment for     MRSA infections.    Labs:  Results for orders placed during the hospital encounter of 04/23/14 (from the past 48 hour(s))   GLUCOSE, CAPILLARY Status: Abnormal    Collection Time    04/29/14 5:39 PM   Result  Value  Ref Range    Glucose-Capillary  235 (*)  70 - 99 mg/dL   GLUCOSE, CAPILLARY Status: Abnormal    Collection Time    04/29/14 8:21 PM   Result  Value  Ref Range    Glucose-Capillary  206 (*)  70 - 99 mg/dL   BASIC METABOLIC PANEL Status: Abnormal    Collection Time    04/30/14 12:00 AM   Result  Value  Ref Range    Sodium  142  137 - 147 mEq/L    Potassium  4.2  3.7 - 5.3 mEq/L    Chloride  105  96 - 112 mEq/L    CO2  24  19 - 32 mEq/L    Glucose, Bld  152 (*)  70 - 99 mg/dL    BUN  9  6 - 23 mg/dL    Creatinine, Ser  1.12 (*)  0.50 - 1.10 mg/dL    Calcium  8.7  8.4 - 10.5 mg/dL    GFR calc non Af Amer  47 (*)  >90 mL/min    GFR calc Af Amer  55 (*)  >90 mL/min    Comment:  (NOTE)     The eGFR has been calculated using the CKD EPI equation.     This calculation has not been validated in all clinical situations.     eGFR's persistently <90 mL/min signify possible Chronic Kidney     Disease.   GLUCOSE, CAPILLARY Status: Abnormal    Collection Time    04/30/14 7:52 AM   Result  Value  Ref Range    Glucose-Capillary  160 (*)  70 - 99 mg/dL   GLUCOSE, CAPILLARY Status: Abnormal    Collection Time    04/30/14 11:31 AM   Result  Value  Ref Range     Glucose-Capillary  167 (*)  70 - 99 mg/dL   GLUCOSE, CAPILLARY Status: None    Collection Time    04/30/14 4:37 PM   Result  Value  Ref Range    Glucose-Capillary  94  70 - 99 mg/dL   GLUCOSE, CAPILLARY Status: Abnormal    Collection Time    04/30/14 8:28 PM   Result  Value  Ref Range    Glucose-Capillary  203 (*)  70 - 99 mg/dL   GLUCOSE, CAPILLARY Status: Abnormal    Collection Time    05/01/14 7:53 AM   Result  Value  Ref Range    Glucose-Capillary  180 (*)  70 - 99 mg/dL    HPI :  73 year old female with a past medical history of hypertension, diabetes type 2, hypothyroidism presented with altered mental status on 6/18.in to ER and was noted to be encephalopathic with increased somnolence. CT showed age indeterminate CVA .Lab work was c/w acute renal failure with elevation in cr from 1.8 to 2.6. Patient appeared hemoconcentrated with poor PO intake per family. ABG was attempted but patient was not cooperative.    HOSPITAL COURSE:  Metabolic encephalopathy/ History of CVA/acute CVA  MRI of the brain showed acute stroke which was probably the primary reason for the patient's confusion Also could be secondary  to ARF, dehydration -UA unremarkable  Speech therapy recommended to continue Dysphagia 1 (puree);Nectar-thick liquid Liquids provided via: Cup;No straw  -PT /OT; recommends SNF; supervision/assistance 24-hour; patient has a bed at [facility]  -6/26 Psychiatry reconsulted they stated that the patient does meet criteria for capacity to make her own medical decisions  Patient had another unresponsive episode on 6/26 subsequently moved to step down unit, this was thought to be secondary to a possible TIA. Repeat CT scan was unchanged. Patient improved neurologically very quickly. Subsequently transferred back to telemetry She is stable for discharge to [facility] today  Acute infarct right anterior basal ganglia w/ prior hx of CVA  -MRI revealed an acute stroke - see  results below  -failed initial swallow eval at bedside , however on 6/20 passed swallow eval  -underwent recent extensive CVA w/u, to include TEE and loop recorder - was started on Plavix at that time  -Dr. Sherral Hammers  indicated that Cardiology was  consulted and did evaluate the loop recorder  -NSR on monitor - resume Plavix as cleared for diet 6/20  Neurology consulted for stroke and  Dr. Nicole Kindred saw the patient during this hospitalization , he recommended to continue with Plavix and Lipitor and aggressive blood pressure control Will schedule appointment with Dr. Leonie Man in one to 2 months because of recurrent CVA  Hypertension, uncontrolled  Uncontrolled in the setting of acute renal failure therefore Lasix and lisinopril and HCTZ were discontinued initially  Has been transitioned to Coreg 25 mg twice a day, started on hydralazine 75 mg 3 times a day Lasix and lisinopril resumed after improvement of creatinine from 2.4-1.16 HCTZ has been discontinued  She will need close outpatient followup of her BMP   DM type 2, uncontrolled, with renal complications  0/93 hemoglobin A1c= 8.9  -Lipid panel within ADA guidelines  -Continue Lantus to 14 units QHS    Acute on chronic renal failure/CKD (chronic kidney disease), stage III-prerenal  Secondary to diuretics  HCTZ discontinued, Lasix and ACE inhibitor was held but resume prior to discharge  Patient will need a repeat BMP on a weekly basis  pt had been instructed to stop meloxicam and naproxen at discharge  last creatinine 6/29 was 1.16  Hypothyroidism  -cont Synthroid 25 mcg daily  TSH 1.55  Discharge Exam:  Blood pressure 185/77, pulse 79, temperature 98.5 F (36.9 C), temperature source Oral, resp. rate 28, height 5' 2" (1.575 m), weight 73.3 kg (161 lb 9.6 oz), SpO2 96.00%.  General: A./O. x2 (does not know where, why she is here), patient not having delirium vs delusions, NAD, No acute respiratory distress  Lungs: Clear to auscultation  bilaterally without wheezes or crackles  Cardiovascular: Regular rate and rhythm without murmur gallop or rub normal S1 and S2  Abdomen: Obese, Nontender, nondistended, soft, bowel sounds positive, no rebound, no ascites, no appreciable mass  Extremities: No significant cyanosis, clubbing, or edema bilateral lower extremities  Neurologic; pupils equal round reactive to light and accommodation, cranial nerves II through XII intact, tongue/uvula midline, mild left hemiparesis 4/5 strength in upper/lower extremity, sensation intact throughout, patient able to ambulate with one-person assist.       Follow-up Information    Follow up with Salena Saner., MD.    Specialty: Internal Medicine    Contact information:

## 2014-05-04 NOTE — Progress Notes (Signed)
[facility]

## 2014-05-05 ENCOUNTER — Encounter: Payer: Self-pay | Admitting: Internal Medicine

## 2014-05-05 ENCOUNTER — Non-Acute Institutional Stay (SKILLED_NURSING_FACILITY): Payer: Commercial Managed Care - HMO | Admitting: Internal Medicine

## 2014-05-05 DIAGNOSIS — I6350 Cerebral infarction due to unspecified occlusion or stenosis of unspecified cerebral artery: Secondary | ICD-10-CM

## 2014-05-05 DIAGNOSIS — N183 Chronic kidney disease, stage 3 unspecified: Secondary | ICD-10-CM

## 2014-05-05 DIAGNOSIS — D649 Anemia, unspecified: Secondary | ICD-10-CM

## 2014-05-05 DIAGNOSIS — I10 Essential (primary) hypertension: Secondary | ICD-10-CM

## 2014-05-05 DIAGNOSIS — I639 Cerebral infarction, unspecified: Secondary | ICD-10-CM

## 2014-05-05 DIAGNOSIS — IMO0002 Reserved for concepts with insufficient information to code with codable children: Secondary | ICD-10-CM

## 2014-05-05 DIAGNOSIS — R05 Cough: Secondary | ICD-10-CM | POA: Insufficient documentation

## 2014-05-05 DIAGNOSIS — E785 Hyperlipidemia, unspecified: Secondary | ICD-10-CM

## 2014-05-05 DIAGNOSIS — R1310 Dysphagia, unspecified: Secondary | ICD-10-CM | POA: Insufficient documentation

## 2014-05-05 DIAGNOSIS — E1129 Type 2 diabetes mellitus with other diabetic kidney complication: Secondary | ICD-10-CM

## 2014-05-05 DIAGNOSIS — E1165 Type 2 diabetes mellitus with hyperglycemia: Secondary | ICD-10-CM

## 2014-05-05 DIAGNOSIS — R059 Cough, unspecified: Secondary | ICD-10-CM | POA: Insufficient documentation

## 2014-05-05 NOTE — Assessment & Plan Note (Addendum)
Her discharge Cr was 1.12. She had acute renal failure during her admission with some concern for dehydration from poor p.o. Intake and diuretic use. Her ACE and diuretic were placed on hold but then restarted. Due to this she will need a  BMP in a week.

## 2014-05-05 NOTE — Assessment & Plan Note (Addendum)
Seen by Dr Vonita MossSteward with neurology who recommended aggressive BP and lipid control. Continue Plavix. She has some residual weakness but will certainly benefit from  PT, OT eval.

## 2014-05-05 NOTE — Assessment & Plan Note (Signed)
LDL 67 during her hospitalization on Atorvastatin. Continue statin and monitor.

## 2014-05-05 NOTE — Assessment & Plan Note (Signed)
Probably due to a chronic disease state. Hgb stable.

## 2014-05-05 NOTE — Assessment & Plan Note (Signed)
Initially she failed a swallow eval during her hospitalization, however, subsequently she passed. Her cough is dry and not associated with meals. Continue with ST eval and monitor.

## 2014-05-05 NOTE — Assessment & Plan Note (Signed)
Could be related to her ace inhibitor. Continue prn Robitussin and monitor. She does not appear to have an infection at this time.

## 2014-05-05 NOTE — Assessment & Plan Note (Signed)
Stable on current meds  Continue to monitor

## 2014-05-05 NOTE — Assessment & Plan Note (Signed)
Continue 70/30 and SSI and metformin for now. Monitor and adjust accordingly with the goal to control CBGs and discontinue the SSI.

## 2014-05-05 NOTE — Progress Notes (Signed)
Patient ID: [name] [name], female   DOB: [birth year], 73 y.o.   MRN: [id number]   [facility] (31)  Code Status:  Full Code  Chief Complaint  Patient presents with  . f/u hospitalization s/p CVA    HPI: This is a 73 y.o female admitted to [facility] for rehabilitation after an acute right CVA with some residual left sided weakness. She previously lived at home and hopes to return after her rehab course is complete. She was at [facility] 04/23/14-05/04/14 for a right basal ganglia infarct. She has a hx of cryptogenic strokes and had an implantable loop recorder placed in May of 2015. She had some dysphagia during her stay and was placed on nectar thick liquids. She has a dry cough today but states that it is not associated with meals.  Of note, she was seen by psychiatry and they stated that she was capable of making her on decisions.     Allergies  Allergen Reactions  . Erythromycin Other (See Comments)    unknown  . Iodine     REACTION: hives  . Penicillins     REACTION: hives    MEDICATIONS -  reviewed  DATA REVIEWED  Exams reviewed   03/2014: TEE:  Study Conclusions - Left ventricle: There was mild concentric hypertrophy. Systolic function was normal. The estimated ejection fraction was in the range of 60% to 65%. Wall motion was normal; there were no regional wall motion abnormalities. - Mitral valve: There was mild regurgitation. - Left atrium: No evidence of thrombus in the atrial cavity or appendage. No evidence of thrombus in the atrial cavity or appendage. No evidence of thrombus in the appendage. - Right atrium: No evidence of thrombus in the atrial cavity or appendage. - Atrial septum: There was a patent foramen ovale. Impressions: - PFO is present.  CT of the head (04/23/14): IMPRESSION:  1. Age indeterminate focal hypodensity within the right caudate  head/anterior limb of the right internal capsule. While this finding  may be related  to chronic small vessel ischemic changes in this  region, possible superimposed acute ischemia is not excluded.  Further evaluation with brain MRI could be performed if there is  clinical concern for acute ischemic infarct in this region.  2. No intracranial hemorrhage.  3. Generalized cerebral atrophy with advanced chronic microvascular  ischemic disease.  PCXR (04/23/14):  No active disease   Laboratory Studies: Lab Results  Component Value Date   WBC 5.4 05/04/2014   HGB 10.6* 05/04/2014   HCT 32.6* 05/04/2014   MCV 89.6 05/04/2014   PLT 302 05/04/2014   Lab Results  Component Value Date   NA 146 05/04/2014   K 3.6* 05/04/2014   BUN 13 05/04/2014   CREATININE 1.16* 05/04/2014   Lab Results  Component Value Date   NA 146 05/04/2014   K 3.6* 05/04/2014   CL 105 05/04/2014   CO2 28 05/04/2014   GLUCOSE 105* 05/04/2014   BUN 13 05/04/2014   CREATININE 1.16* 05/04/2014   CALCIUM 9.0 05/04/2014   ALBUMIN 2.7* 05/04/2014   AST 12 05/04/2014   ALT 8 05/04/2014   ALKPHOS 67 05/04/2014   BILITOT 0.3 05/04/2014   GFRNONAA 45* 05/04/2014   GFRAA 52* 05/04/2014   No results found for this basename: vitaminb12, vitamind        Past Medical History  Diagnosis Date  . Hypertension   . Diabetes mellitus   . Chronic renal insufficiency   .  Hyperlipidemia   . Osteopenia   . CVA (cerebral infarction)    Past Surgical History  Procedure Laterality Date  . Eye surgery    . Loop recorder implant  03-24-2014    MDT LinQ implanted by Dr Johney FrameAllred for cryptogenic stroke  . Tee without cardioversion N/A 03/23/2014    Procedure: TRANSESOPHAGEAL ECHOCARDIOGRAM (TEE);  Surgeon: Lars MassonKatarina H Nelson, MD;  [facility];  Service: Cardiovascular;  Laterality: N/A;   No family status information on file.   History   Social History Narrative  . No narrative on file     REVIEW OF SYSTEMS  DATA OBTAINED: from patient, nurse, medical record, caregiver, family member GENERAL: Feels well   No  recent fever, fatigue, change in appetite or weight SKIN: No itch, rash or open wounds EYES: No eye pain, dryness or itching  No change in vision EARS: No earache, tinnitus, change in hearing NOSE: No congestion, drainage or bleeding MOUTH/THROAT: No mouth or tooth pain  No sore throat   No difficulty chewing or swallowing RESPIRATORY: COUGH without sputum production, wheezing, SOB CARDIAC: No chest pain, palpitations. Edema to legs GI: No abdominal pain  No nausea, vomiting,diarrhea or constipation  No heartburn or reflux  GU: No dysuria, frequency or urgency  No change in urine volume or character No nocturia or change in stream   MUSCULOSKELETAL: No joint pain, swelling or stiffness  No back pain. Weakness to the left side.  Unsteady gait  NEUROLOGIC: No dizziness, fainting, headache, numbness  No change in mental status.  PSYCHIATRIC: No feelings of anxiety, depression  Sleeps well.  No behavior issue.   PHYSICAL EXAM Filed Vitals:   05/05/14 1505  BP: 126/68  Pulse: 62  Temp: 97 F (36.1 C)  Resp: 18   There is no weight on file to calculate BMI. GENERAL APPEARANCE: No acute distress, appropriately groomed, normal body habitus. Alert, pleasant, conversant. SKIN: No diaphoresis, rash, unusual lesions, wounds HEAD: Normocephalic, atraumatic EYES: Conjunctiva/lids clear. Pupils round, reactive. EOMs intact.  EARS: External exam WNL, canals clear. Hearing grossly normal. NOSE: No deformity or discharge. MOUTH/THROAT: Lips w/o lesions. Oral mucosa, tongue moist, w/o lesion. Oropharynx w/o redness or lesions.  NECK: Supple, full ROM. No thyroid tenderness, enlargement or nodule LYMPHATICS: No head, neck or supraclavicular adenopathy RESPIRATORY: Breathing is even, unlabored. Lung sounds are clear and full.  CHEST/BREASTS: No chest deformity. Breasts without tenderness, mass, discharge CARDIOVASCULAR: Heart RRR. No murmur or extra heart sounds  ARTERIAL: No carotid, aortic or  femoral bruit. BPPP +1.  VENOUS: No varicosities. No venous stasis skin changes  EDEMA: +1 BLE edema GASTROINTESTINAL: Abdomen is soft, non-tender, not distended w/ normal bowel sounds. No hepatic or splenic enlargement. No mass, ventral or inguinal hernia.  RECTAL: No anal fissure, skin tag or external hemorrhoid. Sphincter tone WNL. Stool is brown, heme negative. GENITOURINARY: Bladder non tender, not distended. MUSCULOSKELETAL: Moves all extremities with full ROM except some residual weakness to the left arm,normal strength and tone. Back is without kyphosis, scoliosis or spinal process tenderness.  NEUROLOGIC: Oriented to time, place, person. Clear speech. Left Facial droop. PERRLA  PSYCHIATRIC: Mood and affect appropriate to situation  ASSESSMENT/PLAN   Acute CVA (cerebrovascular accident) Seen by Dr Vonita MossSteward with neurology who recommended aggressive BP and lipid control. Continue Plavix. She has some residual weakness but will certainly benefit from  PT, OT eval.  Dysphagia, unspecified(787.20) Initially she failed a swallow eval during her hospitalization, however, subsequently she passed. Her cough is dry  and not associated with meals. Continue with ST eval and monitor.   DM type 2, uncontrolled, with renal complications Continue 70/30 and SSI and metformin for now. Monitor and adjust accordingly with the goal to control CBGs and discontinue the SSI.  CKD (chronic kidney disease), stage III Her discharge Cr was 1.12. She had acute renal failure during her admission with some concern for dehydration from poor p.o. Intake and diuretic use. Her ACE and diuretic were placed on hold but then restarted. Due to this she will need a  BMP in a week.   Hypertension Stable on current meds. Continue to monitor.  Hyperlipidemia LDL 67 during her hospitalization on Atorvastatin. Continue statin and monitor.  Cough Could be related to her ace inhibitor. Continue prn Robitussin and monitor. She  does not appear to have an infection at this time.  Anemia Probably due to a chronic disease state. Hgb stable.    Labs/tests ordered: BMP  Baltazar NajjarMichael Robson M.D./Christina Wert RN, MSN  Addendum; I have reviewed this patient's history and physical examination and agree with the note generated above. She has left pronator drift, left-sided hyperreflexia compatible with a right basal ganglia CVA. As concerning as this she seems to be somewhat lethargic. She arouses easily to conversation however I would wonder about underlying cognitive issues. Besides the acute infarct in the right basal ganglia she also has severe chronic microvascular ischemic change and she almsot certainly has a multi-infarct state.  I note the loop recorder and the PFO. She is on Plavix. We will follow her lab work also her blood pressure pulse etc.

## 2014-05-11 ENCOUNTER — Non-Acute Institutional Stay (SKILLED_NURSING_FACILITY): Payer: Commercial Managed Care - HMO | Admitting: Internal Medicine

## 2014-05-11 DIAGNOSIS — R05 Cough: Secondary | ICD-10-CM

## 2014-05-11 DIAGNOSIS — R059 Cough, unspecified: Secondary | ICD-10-CM

## 2014-05-14 ENCOUNTER — Non-Acute Institutional Stay (SKILLED_NURSING_FACILITY): Payer: Commercial Managed Care - HMO | Admitting: Internal Medicine

## 2014-05-14 DIAGNOSIS — N183 Chronic kidney disease, stage 3 unspecified: Secondary | ICD-10-CM

## 2014-05-19 NOTE — Progress Notes (Signed)
Patient ID: [name] [name], female   DOB: December 18, 1940, 73 y.o.   MRN: [id number]            PROGRESS NOTE  DATE: 05/11/2014        [facility]  LEVEL OF CARE: SNF (31)  Acute Visit  CHIEF COMPLAINT:  Manage cough.    HISTORY OF PRESENT ILLNESS: I was requested by the staff to assess the patient regarding above problem(s):  Staff report that patient has chest congestion and a cough.  Patient states she has been having an occasionally productive cough for several days.  She also admits to occasional shortness of breath.  She denies fever, chills or night sweats.    PAST MEDICAL HISTORY : Reviewed.  No changes/see problem list  CURRENT MEDICATIONS: Reviewed per MAR/see medication list  PHYSICAL EXAMINATION  VS:  T 97.4       P 80      RR 20      BP 124/76     POX 93%             GENERAL: no acute distress, normal body habitus RESPIRATORY: breathing is even & unlabored, BS CTAB CARDIAC: RRR, no murmur,no extra heart sounds, +3 bilateral lower extremity edema      ASSESSMENT/PLAN:  Cough.  New problem.  Start Mucinex 600 mg b.i.d. for one week.    CPT CODE: [id number]         Angela CoxGayani Y Dasanayaka, MD [facility] [phone]

## 2014-05-20 ENCOUNTER — Non-Acute Institutional Stay (SKILLED_NURSING_FACILITY): Payer: Commercial Managed Care - HMO | Admitting: Internal Medicine

## 2014-05-20 DIAGNOSIS — N183 Chronic kidney disease, stage 3 unspecified: Secondary | ICD-10-CM

## 2014-05-20 NOTE — Progress Notes (Signed)
Patient ID: [name] [name], female   DOB: 27-Aug-1941, 73 y.o.   MRN: [id number]           PROGRESS NOTE  DATE: 05/14/2014        [facility]  [facility]  LEVEL OF CARE: SNF (31)  Acute Visit  CHIEF COMPLAINT:  Manage chronic kidney disease stage III.    HISTORY OF PRESENT ILLNESS: I was requested by the staff to assess the patient regarding above problem(s):  CHRONIC KIDNEY DISEASE: The patient's chronic kidney disease is unstable.  Patient denies increasing lower extremity swelling or confusion. Last BUN and creatinine are:   On 05/12/2014:  BUN 24, creatinine 1.49.  In 04/2014:  Creatinine 1.12.  The patient is currently on Lasix and lisinopril.  She has a history of chronic kidney disease stage III.    PAST MEDICAL HISTORY : Reviewed.  No changes/see problem list  CURRENT MEDICATIONS: Reviewed per MAR/see medication list  REVIEW OF SYSTEMS:  GENERAL: no change in appetite, no fatigue, no weight changes, no fever, chills or weakness RESPIRATORY: no cough, SOB, DOE,, wheezing, hemoptysis CARDIAC: no chest pain or palpitations;  chronic lower extremity swelling     GI: no abdominal pain, diarrhea, constipation, heart burn, nausea or vomiting  PHYSICAL EXAMINATION  VS:  T 98.2       P 60      RR 18      BP 130/50     POX 93%       GENERAL: no acute distress, moderately obese body habitus NECK: supple, trachea midline, no neck masses, no thyroid tenderness, no thyromegaly RESPIRATORY: breathing is even & unlabored, BS CTAB CARDIAC: RRR, no murmur,no extra heart sounds, +2 bilateral lower extremity edema     GI: abdomen soft, normal BS, no masses, no tenderness, no hepatomegaly, no splenomegaly PSYCHIATRIC: the patient is alert & oriented to person, affect & behavior appropriate  ASSESSMENT/PLAN:  Chronic kidney disease stage III.  Unstable problem.  Renal functions are worsening.  Decrease Lasix to 20 mg q.d.  Recheck renal functions on 05/18/2014.   CPT CODE:  [id number]         Angela CoxGayani Y Mikeisha Lemonds, MD [facility] [phone]

## 2014-05-22 ENCOUNTER — Non-Acute Institutional Stay (SKILLED_NURSING_FACILITY): Payer: Commercial Managed Care - HMO | Admitting: Internal Medicine

## 2014-05-22 DIAGNOSIS — E1129 Type 2 diabetes mellitus with other diabetic kidney complication: Secondary | ICD-10-CM

## 2014-05-22 DIAGNOSIS — IMO0002 Reserved for concepts with insufficient information to code with codable children: Secondary | ICD-10-CM

## 2014-05-22 DIAGNOSIS — D649 Anemia, unspecified: Secondary | ICD-10-CM

## 2014-05-22 DIAGNOSIS — I69959 Hemiplegia and hemiparesis following unspecified cerebrovascular disease affecting unspecified side: Secondary | ICD-10-CM

## 2014-05-22 DIAGNOSIS — E1165 Type 2 diabetes mellitus with hyperglycemia: Secondary | ICD-10-CM

## 2014-05-22 NOTE — Progress Notes (Signed)
Patient ID: [name] [name], female   DOB: 1941-01-15, 73 y.o.   MRN: [id number] [facility] Chief complaint predischarge review History; this is a patient who was admitted to this facility [facility] with an acute infarct in the right anterior basal ganglia. She also has atrophy with moderate to advanced chronic microvascular ischemia. She is a type II diabetic care with hypertension and continued tobacco abuse appear she has apparently done fairly well in therapy and stopped walking with a walker. She has had a recent swallowing evaluation and her diet was upgraded to regular ground diet with thin liquids. Not completely certain of her ADLs status.  Current medications Atorvastatin 40 daily Lumigan 0.01% one drop into both eyes each bedtime Carvedilol 25 mg by mouth twice a day Plavix 75 by mouth daily Insulin NPH 15 units twice a day with meals Lasix 20  mg daily Gabapentin 300 mg twice a day Hydralazine 75 mg 3 times a day Synthroid 50 mg daily Lisinopril 20 mg daily Glucophage 500 by mouth twice a day Pataday 0.2% one drop into both eyes daily Omeprazole 40 mg by mouth daily KCl 20 mEq by mouth dailly Latanoporst 0.005% one drop into each eye every night at bedtime  Review of systems HEENT patient is not complaining of headache Respiratory no shortness of breath Cardiac no chest pain Endocrine blood sugars are running in the low to mid 100s. I think checking this 4 times a day as superfluous and I think her sliding scale insulin which he rarely uses can be discontinued   Lab review BUN is 17 creatinine 1.46 on 7/13 this seems stable. On 7/8 potassium at 5.1   physical examination Gen. the patient is not in any distress Respiratory clear entry bilaterally Cardiac heart sounds are normal there is no murmurs Abdomen soft no masses Neurologic the patient is able to get out of bed to a sitting position and stand with minimal standby  assistance.  Impression/plan Late effects nondominant hemisphere CVA. She appears to have made decent recovery Type 2 diabetes on insulin her blood sugars appear to be under very decent control Chronic renal insufficiency related to type 2 diabetes this appears to be stable. Hypertension last blood pressure I see at 130/60 this also appears to be stable.  The patient is going home with PT OT skilled nursing she apparently is walking with a walker here and states she has one at home.

## 2014-05-26 NOTE — Progress Notes (Signed)
Patient ID: [name] FullingBetty J Clemon, female   DOB: 08-08-1941, 73 y.o.   MRN: [id number]           PROGRESS NOTE  DATE: 05/20/2014         [facility]  LEVEL OF CARE: SNF (31)  Acute Visit  CHIEF COMPLAINT:  Manage chronic kidney disease stage III.    HISTORY OF PRESENT ILLNESS: I was requested by the staff to assess the patient regarding above problem(s):  CHRONIC KIDNEY DISEASE: The patient's chronic kidney disease remains stable.  Patient denies increasing lower extremity swelling or confusion. Last BUN and creatinine are:   On 05/18/2014:  BUN 17, creatinine 1.46.  On 05/12/2014:  Creatinine 1.49.  Patient has a history of chronic kidney disease stage III.    PAST MEDICAL HISTORY : Reviewed.  No changes/see problem list  CURRENT MEDICATIONS: Reviewed per MAR/see medication list  PHYSICAL EXAMINATION  VS:  T 97.6       P 70      RR 18      BP 132/70     POX 94%       WT (Lb) 161         GENERAL: no acute distress, moderately obese body habitus RESPIRATORY: breathing is even & unlabored, BS CTAB CARDIAC: RRR, no murmur,no extra heart sounds, +2 bilateral lower extremity edema    ASSESSMENT/PLAN:  Chronic kidney disease stage III.  Renal functions improved.    CPT CODE: [id number]         Angela CoxGayani Y Maevis Mumby, MD [facility] [phone]

## 2014-05-29 ENCOUNTER — Telehealth: Payer: Self-pay | Admitting: Cardiology

## 2014-05-29 NOTE — Telephone Encounter (Signed)
Called pt and instructed pt son how to send manual transmission for loop recorder.

## 2014-06-09 ENCOUNTER — Emergency Department (HOSPITAL_COMMUNITY): Payer: Medicare HMO

## 2014-06-09 ENCOUNTER — Encounter (HOSPITAL_COMMUNITY): Payer: Self-pay | Admitting: Emergency Medicine

## 2014-06-09 ENCOUNTER — Emergency Department (HOSPITAL_COMMUNITY)
Admission: EM | Admit: 2014-06-09 | Discharge: 2014-06-09 | Disposition: A | Discharge status: Home / Self Care | Payer: Medicare HMO | Attending: Emergency Medicine | Admitting: Emergency Medicine

## 2014-06-09 DIAGNOSIS — R739 Hyperglycemia, unspecified: Secondary | ICD-10-CM

## 2014-06-09 DIAGNOSIS — Z7901 Long term (current) use of anticoagulants: Secondary | ICD-10-CM | POA: Diagnosis not present

## 2014-06-09 DIAGNOSIS — F17200 Nicotine dependence, unspecified, uncomplicated: Secondary | ICD-10-CM | POA: Diagnosis not present

## 2014-06-09 DIAGNOSIS — Z794 Long term (current) use of insulin: Secondary | ICD-10-CM | POA: Diagnosis not present

## 2014-06-09 DIAGNOSIS — Z8673 Personal history of transient ischemic attack (TIA), and cerebral infarction without residual deficits: Secondary | ICD-10-CM | POA: Insufficient documentation

## 2014-06-09 DIAGNOSIS — R062 Wheezing: Secondary | ICD-10-CM | POA: Insufficient documentation

## 2014-06-09 DIAGNOSIS — E119 Type 2 diabetes mellitus without complications: Secondary | ICD-10-CM | POA: Insufficient documentation

## 2014-06-09 DIAGNOSIS — Z79899 Other long term (current) drug therapy: Secondary | ICD-10-CM | POA: Insufficient documentation

## 2014-06-09 DIAGNOSIS — Z880 Allergy status to penicillin: Secondary | ICD-10-CM | POA: Diagnosis not present

## 2014-06-09 DIAGNOSIS — Z8739 Personal history of other diseases of the musculoskeletal system and connective tissue: Secondary | ICD-10-CM | POA: Diagnosis not present

## 2014-06-09 DIAGNOSIS — E785 Hyperlipidemia, unspecified: Secondary | ICD-10-CM | POA: Insufficient documentation

## 2014-06-09 DIAGNOSIS — R0602 Shortness of breath: Secondary | ICD-10-CM | POA: Diagnosis not present

## 2014-06-09 DIAGNOSIS — N189 Chronic kidney disease, unspecified: Secondary | ICD-10-CM | POA: Diagnosis not present

## 2014-06-09 DIAGNOSIS — I129 Hypertensive chronic kidney disease with stage 1 through stage 4 chronic kidney disease, or unspecified chronic kidney disease: Secondary | ICD-10-CM | POA: Diagnosis not present

## 2014-06-09 LAB — CBC WITH DIFFERENTIAL/PLATELET
BASOS ABS: 0 10*3/uL (ref 0.0–0.1)
Basophils Relative: 0 % (ref 0–1)
EOS PCT: 2 % (ref 0–5)
Eosinophils Absolute: 0.1 10*3/uL (ref 0.0–0.7)
HCT: 28.2 % — ABNORMAL LOW (ref 36.0–46.0)
Hemoglobin: 9.2 g/dL — ABNORMAL LOW (ref 12.0–15.0)
Lymphocytes Relative: 31 % (ref 12–46)
Lymphs Abs: 1.2 10*3/uL (ref 0.7–4.0)
MCH: 30.1 pg (ref 26.0–34.0)
MCHC: 32.6 g/dL (ref 30.0–36.0)
MCV: 92.2 fL (ref 78.0–100.0)
MONO ABS: 0.4 10*3/uL (ref 0.1–1.0)
Monocytes Relative: 11 % (ref 3–12)
Neutro Abs: 2.2 10*3/uL (ref 1.7–7.7)
Neutrophils Relative %: 56 % (ref 43–77)
PLATELETS: 180 10*3/uL (ref 150–400)
RBC: 3.06 MIL/uL — ABNORMAL LOW (ref 3.87–5.11)
RDW: 13.6 % (ref 11.5–15.5)
WBC: 3.9 10*3/uL — ABNORMAL LOW (ref 4.0–10.5)

## 2014-06-09 LAB — CBG MONITORING, ED
Glucose-Capillary: 219 mg/dL — ABNORMAL HIGH (ref 70–99)
Glucose-Capillary: 184 mg/dL — ABNORMAL HIGH (ref 70–99)

## 2014-06-09 LAB — BASIC METABOLIC PANEL
ANION GAP: 9 (ref 5–15)
BUN: 17 mg/dL (ref 6–23)
CALCIUM: 8.4 mg/dL (ref 8.4–10.5)
CO2: 29 mEq/L (ref 19–32)
Chloride: 104 mEq/L (ref 96–112)
Creatinine, Ser: 1.28 mg/dL — ABNORMAL HIGH (ref 0.50–1.10)
GFR calc non Af Amer: 40 mL/min — ABNORMAL LOW (ref >90)
GFR, EST AFRICAN AMERICAN: 46 mL/min — AB (ref >90)
Glucose, Bld: 205 mg/dL — ABNORMAL HIGH (ref 70–99)
Potassium: 4.2 mEq/L (ref 3.7–5.3)
SODIUM: 142 meq/L (ref 137–147)

## 2014-06-09 LAB — URINALYSIS, ROUTINE W REFLEX MICROSCOPIC
Bilirubin Urine: NEGATIVE
Glucose, UA: 250 mg/dL — AB
Hgb urine dipstick: NEGATIVE
KETONES UR: NEGATIVE mg/dL
Leukocytes, UA: NEGATIVE
NITRITE: NEGATIVE
PH: 5.5 (ref 5.0–8.0)
PROTEIN: NEGATIVE mg/dL
Specific Gravity, Urine: 1.011 (ref 1.005–1.030)
UROBILINOGEN UA: 0.2 mg/dL (ref 0.0–1.0)

## 2014-06-09 LAB — PRO B NATRIURETIC PEPTIDE: Pro B Natriuretic peptide (BNP): 1271 pg/mL — ABNORMAL HIGH (ref 0–450)

## 2014-06-09 NOTE — ED Notes (Signed)
[name] [name] [phone] (Son & POA)

## 2014-06-09 NOTE — ED Notes (Addendum)
[facility]. Pt has a home health nurse that comes and out stated that pts blood sugars have been fluctuating and this morning CBG was in the 500s. They administered 40units of insulin and 30mins post administration CBG continued to be in the 500s. Pt CBG when EMS arrived 342. EMS also noticed pt to have upper lobe wheezing and administered Albuterol 5mg  and Atrovent 0.5mg .

## 2014-06-09 NOTE — Discharge Instructions (Signed)
Follow up with your doctor for further evaluation. Refer to attached documents for more information. Return the ED with worsening or concerning symptoms.    Hyperglycemia Hyperglycemia occurs when the glucose (sugar) in your blood is too high. Hyperglycemia can happen for many reasons, but it most often happens to people who do not know they have diabetes or are not managing their diabetes properly.  CAUSES  Whether you have diabetes or not, there are other causes of hyperglycemia. Hyperglycemia can occur when you have diabetes, but it can also occur in other situations that you might not be as aware of, such as: Diabetes  If you have diabetes and are having problems controlling your blood glucose, hyperglycemia could occur because of some of the following reasons:  Not following your meal plan.  Not taking your diabetes medications or not taking it properly.  Exercising less or doing less activity than you normally do.  Being sick. Pre-diabetes  This cannot be ignored. Before people develop Type 2 diabetes, they almost always have "pre-diabetes." This is when your blood glucose levels are higher than normal, but not yet high enough to be diagnosed as diabetes. Research has shown that some long-term damage to the body, especially the heart and circulatory system, may already be occurring during pre-diabetes. If you take action to manage your blood glucose when you have pre-diabetes, you may delay or prevent Type 2 diabetes from developing. Stress  If you have diabetes, you may be "diet" controlled or on oral medications or insulin to control your diabetes. However, you may find that your blood glucose is higher than usual in the hospital whether you have diabetes or not. This is often referred to as "stress hyperglycemia." Stress can elevate your blood glucose. This happens because of hormones put out by the body during times of stress. If stress has been the cause of your high blood glucose,  it can be followed regularly by your caregiver. That way he/she can make sure your hyperglycemia does not continue to get worse or progress to diabetes. Steroids  Steroids are medications that act on the infection fighting system (immune system) to block inflammation or infection. One side effect can be a rise in blood glucose. Most people can produce enough extra insulin to allow for this rise, but for those who cannot, steroids make blood glucose levels go even higher. It is not unusual for steroid treatments to "uncover" diabetes that is developing. It is not always possible to determine if the hyperglycemia will go away after the steroids are stopped. A special blood test called an A1c is sometimes done to determine if your blood glucose was elevated before the steroids were started. SYMPTOMS  Thirsty.  Frequent urination.  Dry mouth.  Blurred vision.  Tired or fatigue.  Weakness.  Sleepy.  Tingling in feet or leg. DIAGNOSIS  Diagnosis is made by monitoring blood glucose in one or all of the following ways:  A1c test. This is a chemical found in your blood.  Fingerstick blood glucose monitoring.  Laboratory results. TREATMENT  First, knowing the cause of the hyperglycemia is important before the hyperglycemia can be treated. Treatment may include, but is not be limited to:  Education.  Change or adjustment in medications.  Change or adjustment in meal plan.  Treatment for an illness, infection, etc.  More frequent blood glucose monitoring.  Change in exercise plan.  Decreasing or stopping steroids.  Lifestyle changes. HOME CARE INSTRUCTIONS   Test your blood glucose as directed.  Exercise regularly. Your caregiver will give you instructions about exercise. Pre-diabetes or diabetes which comes on with stress is helped by exercising.  Eat wholesome, balanced meals. Eat often and at regular, fixed times. Your caregiver or nutritionist will give you a meal plan to  guide your sugar intake.  Being at an ideal weight is important. If needed, losing as little as 10 to 15 pounds may help improve blood glucose levels. SEEK MEDICAL CARE IF:   You have questions about medicine, activity, or diet.  You continue to have symptoms (problems such as increased thirst, urination, or weight gain). SEEK IMMEDIATE MEDICAL CARE IF:   You are vomiting or have diarrhea.  Your breath smells fruity.  You are breathing faster or slower.  You are very sleepy or incoherent.  You have numbness, tingling, or pain in your feet or hands.  You have chest pain.  Your symptoms get worse even though you have been following your caregiver's orders.  If you have any other questions or concerns. Document Released: 04/18/2001 Document Revised: 01/15/2012 Document Reviewed: 02/19/2012 [facility] Patient Information 2015 [place], [place]. This information is not intended to replace advice given to you by your health care provider. Make sure you discuss any questions you have with your health care provider.

## 2014-06-09 NOTE — ED Provider Notes (Signed)
CSN: [id number]     Arrival date & time 06/09/14  1216 History   First MD Initiated Contact with Patient 06/09/14 1232     Chief Complaint  Patient presents with  . Hyperglycemia  . Shortness of Breath  . Wheezing     (Consider location/radiation/quality/duration/timing/severity/associated sxs/prior Treatment) Patient is a 73 y.o. female presenting with hyperglycemia. The history is provided by the EMS personnel and the patient. No language interpreter was used.  Hyperglycemia Blood sugar level PTA:  400's Severity:  Moderate Onset quality:  Sudden Duration:  2 days Timing:  Constant Progression:  Unchanged Chronicity:  New Diabetes status:  Controlled with insulin Current diabetic therapy:  Insulin Time since last antidiabetic medication:  1 hour Context: not change in medication, not insulin pump use, not new diabetes diagnosis, not noncompliance, not recent change in diet and not recent illness   Relieved by:  Nothing Ineffective treatments:  Insulin (Patient's home health nurse reports giving 40units of insulin without improvement in glucose) Associated symptoms: no abdominal pain, no chest pain, no dizziness, no dysuria, no fatigue, no fever, no nausea and no vomiting   Risk factors: obesity   Risk factors: no family hx of diabetes, no hx of DKA, no pancreatic disease, no pregnancy and no recent steroid use     Past Medical History  Diagnosis Date  . Hypertension   . Diabetes mellitus   . Chronic renal insufficiency   . Hyperlipidemia   . Osteopenia   . CVA (cerebral infarction)    Past Surgical History  Procedure Laterality Date  . Eye surgery    . Loop recorder implant  03-24-2014    MDT LinQ implanted by Dr Johney Frame for cryptogenic stroke  . Tee without cardioversion N/A 03/23/2014    Procedure: TRANSESOPHAGEAL ECHOCARDIOGRAM (TEE);  Surgeon: Lars Masson, MD;  Location: [facility] ENDOSCOPY;  Service: Cardiovascular;  Laterality: N/A;   Family History  Problem  Relation Age of Onset  . Diabetes Mother   . Hypertension Mother    History  Substance Use Topics  . Smoking status: Current Every Day Smoker -- 0.50 packs/day for 60 years    Types: Cigarettes  . Smokeless tobacco: Not on file     Comment: pt states "sometimes I quit smoking for about 2 weeks then I start back"  . Alcohol Use: Yes     Comment: occasionally   OB History   Grav Para Term Preterm Abortions TAB SAB Ect Mult Living                 Review of Systems  Constitutional: Negative for fever, chills and fatigue.  HENT: Negative for trouble swallowing.   Eyes: Negative for visual disturbance.  Respiratory: Positive for wheezing.   Cardiovascular: Negative for chest pain and palpitations.  Gastrointestinal: Negative for nausea, vomiting, abdominal pain and diarrhea.  Genitourinary: Negative for dysuria and difficulty urinating.  Musculoskeletal: Negative for arthralgias and neck pain.  Skin: Negative for color change.  Neurological: Negative for dizziness and weakness.  Psychiatric/Behavioral: Negative for dysphoric mood.      Allergies  Erythromycin; Iodine; and Penicillins  Home Medications   Prior to Admission medications   Medication Sig Start Date End Date Taking? Authorizing Provider  atorvastatin (LIPITOR) 40 MG tablet Take 40 mg by mouth daily.    Historical Provider, MD  bimatoprost (LUMIGAN) 0.01 % SOLN Place 1 drop into both eyes at bedtime. 03/24/14   Shanker Levora Dredge, MD  carvedilol (COREG) 25 MG tablet Take  1 tablet (25 mg total) by mouth 2 (two) times daily with a meal. 05/01/14   Richarda Overlie, MD  Chlorpheniramine Maleate (ALLERGY RELIEF PO) Take 1 tablet by mouth daily as needed (for allergies).    Historical Provider, MD  Cholecalciferol (VITAMIN D3) 2000 UNITS TABS Take 2,000 Units by mouth daily. 03/24/14   Shanker Levora Dredge, MD  clopidogrel (PLAVIX) 75 MG tablet Take 1 tablet (75 mg total) by mouth daily with breakfast. 03/24/14   Maretta Bees,  MD  furosemide (LASIX) 20 MG tablet Take 40 mg by mouth daily.  04/03/14   Historical Provider, MD  gabapentin (NEURONTIN) 300 MG capsule Take 1 capsule (300 mg total) by mouth 2 (two) times daily. 03/24/14   Shanker Levora Dredge, MD  guaiFENesin-dextromethorphan (ROBITUSSIN DM) 100-10 MG/5ML syrup Take 5 mLs by mouth every 4 (four) hours as needed for cough. 05/01/14   Richarda Overlie, MD  hydrALAZINE (APRESOLINE) 50 MG tablet Take 1.5 tablets (75 mg total) by mouth 3 (three) times daily. 05/04/14   Richarda Overlie, MD  insulin aspart (NOVOLOG) 100 UNIT/ML injection Inject 0-20 Units into the skin 3 (three) times daily with meals. 05/04/14   Richarda Overlie, MD  insulin NPH-regular Human (NOVOLIN 70/30) (70-30) 100 UNIT/ML injection Inject 15 Units into the skin 2 (two) times daily with a meal. 03/24/14   Shanker Levora Dredge, MD  levothyroxine (SYNTHROID, LEVOTHROID) 50 MCG tablet Take 1 tablet (50 mcg total) by mouth daily. 03/24/14   Shanker Levora Dredge, MD  lisinopril (PRINIVIL,ZESTRIL) 10 MG tablet Take 1 tablet (10 mg total) by mouth daily. 05/04/14   Richarda Overlie, MD  Olopatadine HCl (PATADAY) 0.2 % SOLN Place 1 drop into both eyes daily. 03/24/14   Shanker Levora Dredge, MD  omeprazole (PRILOSEC) 40 MG capsule Take 1 capsule (40 mg total) by mouth daily. 03/24/14   Shanker Levora Dredge, MD  potassium chloride SA (K-DUR,KLOR-CON) 20 MEQ tablet Take 1 tablet (20 mEq total) by mouth daily. 03/24/14   Shanker Levora Dredge, MD   BP 154/91  Pulse 57  Temp(Src) 97.5 F (36.4 C) (Oral)  Resp 20  SpO2 98% Physical Exam  Nursing note and vitals reviewed. Constitutional: She is oriented to person, place, and time. She appears well-developed and well-nourished. No distress.  HENT:  Head: Normocephalic and atraumatic.  Eyes: Conjunctivae and EOM are normal.  Neck: Normal range of motion.  Cardiovascular: Normal rate and regular rhythm.  Exam reveals no gallop and no friction rub.   No murmur heard. Pulmonary/Chest: Effort normal.  She has no wheezes. She has no rales. She exhibits no tenderness.  Patient coughing with deep inspiration. Inspiratory and expiratory rhonchi noted in all lung fields.   Abdominal: Soft. She exhibits no distension. There is no tenderness. There is no rebound and no guarding.  Musculoskeletal: Normal range of motion. She exhibits no edema.  Neurological: She is alert and oriented to person, place, and time. Coordination normal.  Speech is goal-oriented. Moves limbs without ataxia.   Skin: Skin is warm and dry.  Psychiatric: She has a normal mood and affect. Her behavior is normal.    ED Course  Procedures (including critical care time) Labs Review Labs Reviewed  CBC WITH DIFFERENTIAL - Abnormal; Notable for the following:    WBC 3.9 (*)    RBC 3.06 (*)    Hemoglobin 9.2 (*)    HCT 28.2 (*)    All other components within normal limits  BASIC METABOLIC PANEL - Abnormal; Notable  for the following:    Glucose, Bld 205 (*)    Creatinine, Ser 1.28 (*)    GFR calc non Af Amer 40 (*)    GFR calc Af Amer 46 (*)    All other components within normal limits  URINALYSIS, ROUTINE W REFLEX MICROSCOPIC - Abnormal; Notable for the following:    Glucose, UA 250 (*)    All other components within normal limits  PRO B NATRIURETIC PEPTIDE - Abnormal; Notable for the following:    Pro B Natriuretic peptide (BNP) 1271.0 (*)    All other components within normal limits  CBG MONITORING, ED - Abnormal; Notable for the following:    Glucose-Capillary 219 (*)    All other components within normal limits  CBG MONITORING, ED - Abnormal; Notable for the following:    Glucose-Capillary 184 (*)    All other components within normal limits    Imaging Review Dg Chest 2 View  06/09/2014   CLINICAL DATA:  Shortness of breath and cough, assess for pneumonia.  EXAM: CHEST  2 VIEW  COMPARISON:  Chest radiograph May 01, 2014  FINDINGS: The cardiac silhouette is upper limits of normal in size, mildly calcified  aortic knob. The lungs are clear without pleural effusions or focal consolidations. Stable subcentimeter new nodular density projecting over the the spine on the lateral radiograph. Trachea projects midline and there is no pneumothorax. Skin fold right hemithorax. Soft tissue planes and included osseous structures are non-suspicious.  IMPRESSION: Borderline cardiomegaly, no acute pulmonary process   Electronically Signed   By: Awilda Metroourtnay  Bloomer   On: 06/09/2014 13:21     EKG Interpretation None      MDM   Final diagnoses:  Hyperglycemia    2:24 PM Chest xray shows borderline cardiomegaly without acute changes. Labs pending. Vitals stable and patient afebrile.   3:57 PM Patient has elevated BNP. Patient does not complain of SOB. Patient is worried about her glucose which is now 184. Dr. Loretha StaplerWofford saw the patient who agrees she can be discharged. Patient pending urinalysis to rule out infection. Vitals stable and patient afebrile.   Emilia BeckKaitlyn Siham Bucaro, [place]PA-C 06/10/14 [phone]

## 2014-06-09 NOTE — ED Provider Notes (Signed)
Medical screening examination/treatment/procedure(s) were conducted as a shared visit with non-physician practitioner(s) and myself.  I personally evaluated the patient during the encounter.   EKG Interpretation None      73 yo female presenting with hyperglycemia.  She was given insulin prior to arrival.  Blood sugar was 400, now it is less than 200.  She feels occasionally short of breath, but does not now.  On exam, well appearing, nontoxic, not distressed, normal respiratory effort, normal perfusion, lungs CTAB, no peripheral edema, heart sounds normal with RRR.  Plan dc home.  Clinical Impression: 1. Hyperglycemia      Candyce ChurnJohn David Shawneequa Baldridge III, MD 06/10/14 [phone]

## 2014-06-10 ENCOUNTER — Telehealth: Payer: Self-pay | Admitting: *Deleted

## 2014-06-10 NOTE — Telephone Encounter (Signed)
Patient asked that Harriett Sineancy her sister-n-law be called and given her appointment information. Message left appointment 8/12 @ 10:30. I also left office name, address, and phone number.

## 2014-06-10 NOTE — ED Provider Notes (Signed)
Medical screening examination/treatment/procedure(s) were conducted as a shared visit with non-physician practitioner(s) and myself.  I personally evaluated the patient during the encounter.   EKG Interpretation None        Candyce ChurnJohn David Arsal Tappan III, MD 06/10/14 [phone]

## 2014-06-11 ENCOUNTER — Telehealth: Payer: Self-pay | Admitting: Neurology

## 2014-06-11 NOTE — Telephone Encounter (Signed)
I returned call to WadeNancy @ [phone] and she says call patient's son Less @ [phone] with appointment info. He could not bring her on 8/12 and asked if appointment could be rescheduled. I explained that it would be further out as the Dr. Roda ShuttersXu is not in office every week. He understood and rescheduled for Fri 07/17/14.

## 2014-06-11 NOTE — Telephone Encounter (Signed)
Person at the number listed for patient called to state that we called the wrong number and to call the patient at [phone]. Please call regarding appointment information with Dr. Roda ShuttersXu.

## 2014-06-12 ENCOUNTER — Ambulatory Visit (INDEPENDENT_AMBULATORY_CARE_PROVIDER_SITE_OTHER): Payer: Commercial Managed Care - HMO | Admitting: *Deleted

## 2014-06-12 ENCOUNTER — Inpatient Hospital Stay (HOSPITAL_COMMUNITY): Payer: Medicare HMO

## 2014-06-12 ENCOUNTER — Encounter (HOSPITAL_COMMUNITY): Payer: Self-pay | Admitting: Emergency Medicine

## 2014-06-12 ENCOUNTER — Emergency Department (HOSPITAL_COMMUNITY): Payer: Medicare HMO

## 2014-06-12 ENCOUNTER — Inpatient Hospital Stay (HOSPITAL_COMMUNITY)
Admission: EM | Admit: 2014-06-12 | Discharge: 2014-06-19 | DRG: 189 | Disposition: A | Discharge status: Skilled Nursing Facility | Payer: Medicare HMO | Attending: Internal Medicine | Admitting: Internal Medicine

## 2014-06-12 ENCOUNTER — Telehealth: Payer: Self-pay | Admitting: Internal Medicine

## 2014-06-12 DIAGNOSIS — E118 Type 2 diabetes mellitus with unspecified complications: Secondary | ICD-10-CM

## 2014-06-12 DIAGNOSIS — I739 Peripheral vascular disease, unspecified: Secondary | ICD-10-CM

## 2014-06-12 DIAGNOSIS — Z91199 Patient's noncompliance with other medical treatment and regimen due to unspecified reason: Secondary | ICD-10-CM

## 2014-06-12 DIAGNOSIS — Z833 Family history of diabetes mellitus: Secondary | ICD-10-CM | POA: Diagnosis not present

## 2014-06-12 DIAGNOSIS — N183 Chronic kidney disease, stage 3 unspecified: Secondary | ICD-10-CM

## 2014-06-12 DIAGNOSIS — Z881 Allergy status to other antibiotic agents status: Secondary | ICD-10-CM | POA: Diagnosis not present

## 2014-06-12 DIAGNOSIS — E876 Hypokalemia: Secondary | ICD-10-CM | POA: Diagnosis present

## 2014-06-12 DIAGNOSIS — E1129 Type 2 diabetes mellitus with other diabetic kidney complication: Secondary | ICD-10-CM | POA: Diagnosis present

## 2014-06-12 DIAGNOSIS — IMO0002 Reserved for concepts with insufficient information to code with codable children: Secondary | ICD-10-CM

## 2014-06-12 DIAGNOSIS — E785 Hyperlipidemia, unspecified: Secondary | ICD-10-CM | POA: Diagnosis present

## 2014-06-12 DIAGNOSIS — Z9119 Patient's noncompliance with other medical treatment and regimen: Secondary | ICD-10-CM

## 2014-06-12 DIAGNOSIS — J189 Pneumonia, unspecified organism: Secondary | ICD-10-CM | POA: Diagnosis present

## 2014-06-12 DIAGNOSIS — E872 Acidosis, unspecified: Secondary | ICD-10-CM

## 2014-06-12 DIAGNOSIS — I10 Essential (primary) hypertension: Secondary | ICD-10-CM

## 2014-06-12 DIAGNOSIS — I5032 Chronic diastolic (congestive) heart failure: Secondary | ICD-10-CM | POA: Diagnosis present

## 2014-06-12 DIAGNOSIS — J9600 Acute respiratory failure, unspecified whether with hypoxia or hypercapnia: Secondary | ICD-10-CM | POA: Diagnosis present

## 2014-06-12 DIAGNOSIS — Z79899 Other long term (current) drug therapy: Secondary | ICD-10-CM | POA: Diagnosis not present

## 2014-06-12 DIAGNOSIS — E662 Morbid (severe) obesity with alveolar hypoventilation: Secondary | ICD-10-CM | POA: Diagnosis present

## 2014-06-12 DIAGNOSIS — E874 Mixed disorder of acid-base balance: Secondary | ICD-10-CM | POA: Diagnosis present

## 2014-06-12 DIAGNOSIS — E8729 Other acidosis: Secondary | ICD-10-CM | POA: Diagnosis present

## 2014-06-12 DIAGNOSIS — I129 Hypertensive chronic kidney disease with stage 1 through stage 4 chronic kidney disease, or unspecified chronic kidney disease: Secondary | ICD-10-CM | POA: Diagnosis present

## 2014-06-12 DIAGNOSIS — J441 Chronic obstructive pulmonary disease with (acute) exacerbation: Secondary | ICD-10-CM

## 2014-06-12 DIAGNOSIS — R339 Retention of urine, unspecified: Secondary | ICD-10-CM

## 2014-06-12 DIAGNOSIS — D649 Anemia, unspecified: Secondary | ICD-10-CM | POA: Diagnosis present

## 2014-06-12 DIAGNOSIS — R1310 Dysphagia, unspecified: Secondary | ICD-10-CM | POA: Diagnosis present

## 2014-06-12 DIAGNOSIS — J9601 Acute respiratory failure with hypoxia: Secondary | ICD-10-CM

## 2014-06-12 DIAGNOSIS — I69959 Hemiplegia and hemiparesis following unspecified cerebrovascular disease affecting unspecified side: Secondary | ICD-10-CM

## 2014-06-12 DIAGNOSIS — E669 Obesity, unspecified: Secondary | ICD-10-CM | POA: Diagnosis present

## 2014-06-12 DIAGNOSIS — Z880 Allergy status to penicillin: Secondary | ICD-10-CM | POA: Diagnosis not present

## 2014-06-12 DIAGNOSIS — E039 Hypothyroidism, unspecified: Secondary | ICD-10-CM | POA: Diagnosis present

## 2014-06-12 DIAGNOSIS — R001 Bradycardia, unspecified: Secondary | ICD-10-CM

## 2014-06-12 DIAGNOSIS — Z794 Long term (current) use of insulin: Secondary | ICD-10-CM

## 2014-06-12 DIAGNOSIS — R569 Unspecified convulsions: Secondary | ICD-10-CM

## 2014-06-12 DIAGNOSIS — Z8249 Family history of ischemic heart disease and other diseases of the circulatory system: Secondary | ICD-10-CM

## 2014-06-12 DIAGNOSIS — R059 Cough, unspecified: Secondary | ICD-10-CM

## 2014-06-12 DIAGNOSIS — N058 Unspecified nephritic syndrome with other morphologic changes: Secondary | ICD-10-CM | POA: Diagnosis present

## 2014-06-12 DIAGNOSIS — G4733 Obstructive sleep apnea (adult) (pediatric): Secondary | ICD-10-CM | POA: Diagnosis present

## 2014-06-12 DIAGNOSIS — J9602 Acute respiratory failure with hypercapnia: Secondary | ICD-10-CM

## 2014-06-12 DIAGNOSIS — G929 Unspecified toxic encephalopathy: Secondary | ICD-10-CM | POA: Diagnosis present

## 2014-06-12 DIAGNOSIS — E1169 Type 2 diabetes mellitus with other specified complication: Secondary | ICD-10-CM

## 2014-06-12 DIAGNOSIS — N179 Acute kidney failure, unspecified: Secondary | ICD-10-CM

## 2014-06-12 DIAGNOSIS — F17200 Nicotine dependence, unspecified, uncomplicated: Secondary | ICD-10-CM | POA: Diagnosis present

## 2014-06-12 DIAGNOSIS — Z8673 Personal history of transient ischemic attack (TIA), and cerebral infarction without residual deficits: Secondary | ICD-10-CM

## 2014-06-12 DIAGNOSIS — R4182 Altered mental status, unspecified: Secondary | ICD-10-CM | POA: Diagnosis present

## 2014-06-12 DIAGNOSIS — I6350 Cerebral infarction due to unspecified occlusion or stenosis of unspecified cerebral artery: Secondary | ICD-10-CM

## 2014-06-12 DIAGNOSIS — Z7902 Long term (current) use of antithrombotics/antiplatelets: Secondary | ICD-10-CM | POA: Diagnosis not present

## 2014-06-12 DIAGNOSIS — E1165 Type 2 diabetes mellitus with hyperglycemia: Secondary | ICD-10-CM | POA: Diagnosis present

## 2014-06-12 DIAGNOSIS — G9340 Encephalopathy, unspecified: Secondary | ICD-10-CM

## 2014-06-12 DIAGNOSIS — G9341 Metabolic encephalopathy: Secondary | ICD-10-CM

## 2014-06-12 DIAGNOSIS — G92 Toxic encephalopathy: Secondary | ICD-10-CM | POA: Diagnosis present

## 2014-06-12 DIAGNOSIS — E860 Dehydration: Secondary | ICD-10-CM

## 2014-06-12 DIAGNOSIS — N189 Chronic kidney disease, unspecified: Secondary | ICD-10-CM

## 2014-06-12 DIAGNOSIS — R05 Cough: Secondary | ICD-10-CM

## 2014-06-12 DIAGNOSIS — I639 Cerebral infarction, unspecified: Secondary | ICD-10-CM

## 2014-06-12 LAB — GLUCOSE, CAPILLARY
Glucose-Capillary: 302 mg/dL — ABNORMAL HIGH (ref 70–99)
Glucose-Capillary: 210 mg/dL — ABNORMAL HIGH (ref 70–99)

## 2014-06-12 LAB — RAPID URINE DRUG SCREEN, HOSP PERFORMED
AMPHETAMINES: NOT DETECTED
BARBITURATES: NOT DETECTED
Benzodiazepines: NOT DETECTED
COCAINE: NOT DETECTED
OPIATES: NOT DETECTED
TETRAHYDROCANNABINOL: NOT DETECTED

## 2014-06-12 LAB — I-STAT ARTERIAL BLOOD GAS, ED
Acid-base deficit: 1 mmol/L (ref 0.0–2.0)
BICARBONATE: 29.7 meq/L — AB (ref 20.0–24.0)
Bicarbonate: 26.9 mEq/L — ABNORMAL HIGH (ref 20.0–24.0)
Bicarbonate: 27.7 mEq/L — ABNORMAL HIGH (ref 20.0–24.0)
O2 SAT: 92 %
O2 SAT: 81 %
O2 Saturation: 100 %
PCO2 ART: 59.3 mmHg — AB (ref 35.0–45.0)
PH ART: 7.265 — AB (ref 7.350–7.450)
PH ART: 7.27 — AB (ref 7.350–7.450)
TCO2: 32 mmol/L (ref 0–100)
TCO2: 29 mmol/L (ref 0–100)
TCO2: 30 mmol/L (ref 0–100)
pCO2 arterial: 75.5 mmHg (ref 35.0–45.0)
pCO2 arterial: 60.3 mmHg (ref 35.0–45.0)
pH, Arterial: 7.199 — CL (ref 7.350–7.450)
pO2, Arterial: 359 mmHg — ABNORMAL HIGH (ref 80.0–100.0)
pO2, Arterial: 76 mmHg — ABNORMAL LOW (ref 80.0–100.0)
pO2, Arterial: 52 mmHg — ABNORMAL LOW (ref 80.0–100.0)

## 2014-06-12 LAB — CBC
HEMATOCRIT: 28.7 % — AB (ref 36.0–46.0)
Hemoglobin: 9.1 g/dL — ABNORMAL LOW (ref 12.0–15.0)
MCH: 29.2 pg (ref 26.0–34.0)
MCHC: 31.7 g/dL (ref 30.0–36.0)
MCV: 92 fL (ref 78.0–100.0)
Platelets: 188 10*3/uL (ref 150–400)
RBC: 3.12 MIL/uL — ABNORMAL LOW (ref 3.87–5.11)
RDW: 13.4 % (ref 11.5–15.5)
WBC: 8.8 10*3/uL (ref 4.0–10.5)

## 2014-06-12 LAB — URINALYSIS, ROUTINE W REFLEX MICROSCOPIC
BILIRUBIN URINE: NEGATIVE
Glucose, UA: 500 mg/dL — AB
Hgb urine dipstick: NEGATIVE
Ketones, ur: NEGATIVE mg/dL
Leukocytes, UA: NEGATIVE
NITRITE: NEGATIVE
Protein, ur: 30 mg/dL — AB
Specific Gravity, Urine: 1.016 (ref 1.005–1.030)
UROBILINOGEN UA: 0.2 mg/dL (ref 0.0–1.0)
pH: 5 (ref 5.0–8.0)

## 2014-06-12 LAB — URINE MICROSCOPIC-ADD ON

## 2014-06-12 LAB — DIFFERENTIAL
Basophils Absolute: 0 10*3/uL (ref 0.0–0.1)
Basophils Relative: 0 % (ref 0–1)
Eosinophils Absolute: 0 10*3/uL (ref 0.0–0.7)
Eosinophils Relative: 0 % (ref 0–5)
Lymphocytes Relative: 5 % — ABNORMAL LOW (ref 12–46)
Lymphs Abs: 0.4 10*3/uL — ABNORMAL LOW (ref 0.7–4.0)
MONO ABS: 0.1 10*3/uL (ref 0.1–1.0)
Monocytes Relative: 1 % — ABNORMAL LOW (ref 3–12)
Neutro Abs: 8.3 10*3/uL — ABNORMAL HIGH (ref 1.7–7.7)
Neutrophils Relative %: 94 % — ABNORMAL HIGH (ref 43–77)

## 2014-06-12 LAB — ETHANOL: Alcohol, Ethyl (B): <11 mg/dL (ref 0–11)

## 2014-06-12 LAB — COMPREHENSIVE METABOLIC PANEL
ALK PHOS: 81 U/L (ref 39–117)
ALT: 21 U/L (ref 0–35)
AST: 17 U/L (ref 0–37)
Albumin: 2.8 g/dL — ABNORMAL LOW (ref 3.5–5.2)
Anion gap: 12 (ref 5–15)
BUN: 18 mg/dL (ref 6–23)
CO2: 24 meq/L (ref 19–32)
CREATININE: 1.06 mg/dL (ref 0.50–1.10)
Calcium: 8.1 mg/dL — ABNORMAL LOW (ref 8.4–10.5)
Chloride: 104 mEq/L (ref 96–112)
GFR calc Af Amer: 58 mL/min — ABNORMAL LOW (ref >90)
GFR, EST NON AFRICAN AMERICAN: 50 mL/min — AB (ref >90)
Glucose, Bld: 329 mg/dL — ABNORMAL HIGH (ref 70–99)
Potassium: 3.8 mEq/L (ref 3.7–5.3)
SODIUM: 140 meq/L (ref 137–147)
Total Bilirubin: 0.3 mg/dL (ref 0.3–1.2)
Total Protein: 6.5 g/dL (ref 6.0–8.3)

## 2014-06-12 LAB — CBG MONITORING, ED
Glucose-Capillary: 319 mg/dL — ABNORMAL HIGH (ref 70–99)
Glucose-Capillary: 227 mg/dL — ABNORMAL HIGH (ref 70–99)
Glucose-Capillary: 254 mg/dL — ABNORMAL HIGH (ref 70–99)
Glucose-Capillary: 271 mg/dL — ABNORMAL HIGH (ref 70–99)
Glucose-Capillary: 280 mg/dL — ABNORMAL HIGH (ref 70–99)

## 2014-06-12 LAB — I-STAT TROPONIN, ED: Troponin i, poc: 0.04 ng/mL (ref 0.00–0.08)

## 2014-06-12 LAB — I-STAT CHEM 8, ED
BUN: 18 mg/dL (ref 6–23)
CHLORIDE: 103 meq/L (ref 96–112)
CREATININE: 1.2 mg/dL — AB (ref 0.50–1.10)
Calcium, Ion: 1.13 mmol/L (ref 1.13–1.30)
GLUCOSE: 357 mg/dL — AB (ref 70–99)
HEMATOCRIT: 31 % — AB (ref 36.0–46.0)
Hemoglobin: 10.5 g/dL — ABNORMAL LOW (ref 12.0–15.0)
POTASSIUM: 3.6 meq/L — AB (ref 3.7–5.3)
Sodium: 141 mEq/L (ref 137–147)
TCO2: 25 mmol/L (ref 0–100)

## 2014-06-12 LAB — PRO B NATRIURETIC PEPTIDE: Pro B Natriuretic peptide (BNP): 1666 pg/mL — ABNORMAL HIGH (ref 0–450)

## 2014-06-12 LAB — PROTIME-INR
INR: 1.28 (ref 0.00–1.49)
Prothrombin Time: 16 seconds — ABNORMAL HIGH (ref 11.6–15.2)

## 2014-06-12 LAB — APTT: aPTT: 30 seconds (ref 24–37)

## 2014-06-12 LAB — I-STAT CG4 LACTIC ACID, ED: Lactic Acid, Venous: 0.67 mmol/L (ref 0.5–2.2)

## 2014-06-12 LAB — KETONES, QUALITATIVE: Acetone, Bld: NEGATIVE

## 2014-06-12 LAB — MRSA PCR SCREENING: MRSA by PCR: NEGATIVE

## 2014-06-12 MED ORDER — ARFORMOTEROL TARTRATE 15 MCG/2ML IN NEBU
15.0000 ug | INHALATION_SOLUTION | Freq: Two times a day (BID) | RESPIRATORY_TRACT | Status: DC
  Administered 2014-06-12: 15 ug via RESPIRATORY_TRACT
  Filled 2014-06-12 (×4): qty 2

## 2014-06-12 MED ORDER — LABETALOL HCL 5 MG/ML IV SOLN
10.0000 mg | Freq: Once | INTRAVENOUS | Status: AC
  Administered 2014-06-12: 10 mg via INTRAVENOUS
  Filled 2014-06-12: qty 4

## 2014-06-12 MED ORDER — ALBUTEROL (5 MG/ML) CONTINUOUS INHALATION SOLN
15.0000 mg/h | INHALATION_SOLUTION | RESPIRATORY_TRACT | Status: DC
  Administered 2014-06-12: 15 mg/h via RESPIRATORY_TRACT
  Filled 2014-06-12: qty 20

## 2014-06-12 MED ORDER — INSULIN ASPART PROT & ASPART (70-30 MIX) 100 UNIT/ML ~~LOC~~ SUSP
15.0000 [IU] | Freq: Two times a day (BID) | SUBCUTANEOUS | Status: DC
Start: 2014-06-12 — End: 2014-06-14
  Administered 2014-06-12 – 2014-06-14 (×4): 15 [IU] via SUBCUTANEOUS
  Filled 2014-06-12: qty 10

## 2014-06-12 MED ORDER — INSULIN ASPART 100 UNIT/ML ~~LOC~~ SOLN
10.0000 [IU] | Freq: Once | SUBCUTANEOUS | Status: AC
  Administered 2014-06-12: 10 [IU] via INTRAVENOUS
  Filled 2014-06-12: qty 1

## 2014-06-12 MED ORDER — NALOXONE HCL 0.4 MG/ML IJ SOLN
INTRAMUSCULAR | Status: AC
  Administered 2014-06-12: 0.4 mg
  Filled 2014-06-12: qty 1

## 2014-06-12 MED ORDER — SODIUM CHLORIDE 0.9 % IJ SOLN
3.0000 mL | Freq: Two times a day (BID) | INTRAMUSCULAR | Status: DC
  Administered 2014-06-12 – 2014-06-18 (×8): 3 mL via INTRAVENOUS

## 2014-06-12 MED ORDER — CETYLPYRIDINIUM CHLORIDE 0.05 % MT LIQD
7.0000 mL | Freq: Two times a day (BID) | OROMUCOSAL | Status: DC
  Administered 2014-06-12 – 2014-06-19 (×10): 7 mL via OROMUCOSAL

## 2014-06-12 MED ORDER — ROCURONIUM BROMIDE 50 MG/5ML IV SOLN
INTRAVENOUS | Status: AC
  Filled 2014-06-12: qty 2

## 2014-06-12 MED ORDER — CHLORHEXIDINE GLUCONATE 0.12 % MT SOLN
15.0000 mL | Freq: Two times a day (BID) | OROMUCOSAL | Status: DC
Start: 2014-06-12 — End: 2014-06-19
  Administered 2014-06-12 – 2014-06-18 (×11): 15 mL via OROMUCOSAL
  Filled 2014-06-12 (×13): qty 15

## 2014-06-12 MED ORDER — SUCCINYLCHOLINE CHLORIDE 20 MG/ML IJ SOLN
INTRAMUSCULAR | Status: AC
  Filled 2014-06-12: qty 1

## 2014-06-12 MED ORDER — CARVEDILOL 25 MG PO TABS
25.0000 mg | ORAL_TABLET | Freq: Two times a day (BID) | ORAL | Status: DC
  Administered 2014-06-13 – 2014-06-19 (×13): 25 mg via ORAL
  Filled 2014-06-12 (×16): qty 1

## 2014-06-12 MED ORDER — METHYLPREDNISOLONE SODIUM SUCC 125 MG IJ SOLR
125.0000 mg | Freq: Once | INTRAMUSCULAR | Status: AC
  Administered 2014-06-12: 125 mg via INTRAVENOUS
  Filled 2014-06-12: qty 2

## 2014-06-12 MED ORDER — GABAPENTIN 300 MG PO CAPS
300.0000 mg | ORAL_CAPSULE | Freq: Two times a day (BID) | ORAL | Status: DC
  Administered 2014-06-13 – 2014-06-19 (×13): 300 mg via ORAL
  Filled 2014-06-12 (×17): qty 1

## 2014-06-12 MED ORDER — ETOMIDATE 2 MG/ML IV SOLN
INTRAVENOUS | Status: AC
  Filled 2014-06-12: qty 20

## 2014-06-12 MED ORDER — LIDOCAINE HCL (CARDIAC) 20 MG/ML IV SOLN
INTRAVENOUS | Status: DC
Start: 2014-06-12 — End: 2014-06-12
  Filled 2014-06-12: qty 5

## 2014-06-12 MED ORDER — SODIUM CHLORIDE 0.9 % IV SOLN: INTRAVENOUS | Status: DC

## 2014-06-12 MED ORDER — LEVOTHYROXINE SODIUM 50 MCG PO TABS
50.0000 ug | ORAL_TABLET | Freq: Every day | ORAL | Status: DC
  Filled 2014-06-12: qty 1

## 2014-06-12 MED ORDER — LEVOFLOXACIN IN D5W 500 MG/100ML IV SOLN
500.0000 mg | INTRAVENOUS | Status: DC
Start: 2014-06-12 — End: 2014-06-13
  Administered 2014-06-12: 500 mg via INTRAVENOUS
  Filled 2014-06-12 (×2): qty 100

## 2014-06-12 MED ORDER — BUDESONIDE 0.25 MG/2ML IN SUSP
0.2500 mg | Freq: Two times a day (BID) | RESPIRATORY_TRACT | Status: DC
  Administered 2014-06-12 – 2014-06-17 (×9): 0.25 mg via RESPIRATORY_TRACT
  Filled 2014-06-12 (×14): qty 2

## 2014-06-12 MED ORDER — INSULIN ASPART 100 UNIT/ML ~~LOC~~ SOLN
0.0000 [IU] | SUBCUTANEOUS | Status: DC
  Administered 2014-06-12: 8 [IU] via SUBCUTANEOUS
  Administered 2014-06-12: 5 [IU] via SUBCUTANEOUS
  Administered 2014-06-13: 3 [IU] via SUBCUTANEOUS
  Administered 2014-06-13: 2 [IU] via SUBCUTANEOUS
  Filled 2014-06-12: qty 1

## 2014-06-12 MED ORDER — HYDRALAZINE HCL 25 MG PO TABS
25.0000 mg | ORAL_TABLET | Freq: Three times a day (TID) | ORAL | Status: DC
  Filled 2014-06-12 (×2): qty 1

## 2014-06-12 MED ORDER — SODIUM CHLORIDE 0.9 % IV BOLUS (SEPSIS)
1000.0000 mL | Freq: Once | INTRAVENOUS | Status: AC
  Administered 2014-06-12: 1000 mL via INTRAVENOUS

## 2014-06-12 MED ORDER — HYDRALAZINE HCL 20 MG/ML IJ SOLN
10.0000 mg | Freq: Once | INTRAMUSCULAR | Status: AC
  Administered 2014-06-12: 10 mg via INTRAVENOUS

## 2014-06-12 MED ORDER — IPRATROPIUM-ALBUTEROL 0.5-2.5 (3) MG/3ML IN SOLN
3.0000 mL | RESPIRATORY_TRACT | Status: DC
  Administered 2014-06-12 – 2014-06-13 (×7): 3 mL via RESPIRATORY_TRACT
  Filled 2014-06-12 (×7): qty 3

## 2014-06-12 MED ORDER — IPRATROPIUM-ALBUTEROL 0.5-2.5 (3) MG/3ML IN SOLN
3.0000 mL | Freq: Once | RESPIRATORY_TRACT | Status: AC
  Administered 2014-06-12: 3 mL via RESPIRATORY_TRACT
  Filled 2014-06-12: qty 3

## 2014-06-12 MED ORDER — CLOPIDOGREL BISULFATE 75 MG PO TABS
75.0000 mg | ORAL_TABLET | Freq: Every day | ORAL | Status: DC
  Administered 2014-06-13 – 2014-06-19 (×7): 75 mg via ORAL
  Filled 2014-06-12 (×8): qty 1

## 2014-06-12 MED ORDER — HYDRALAZINE HCL 20 MG/ML IJ SOLN
10.0000 mg | Freq: Four times a day (QID) | INTRAMUSCULAR | Status: DC | PRN
  Administered 2014-06-12: 10 mg via INTRAVENOUS
  Filled 2014-06-12: qty 1

## 2014-06-12 MED ORDER — ENOXAPARIN SODIUM 40 MG/0.4ML ~~LOC~~ SOLN
40.0000 mg | SUBCUTANEOUS | Status: DC
  Administered 2014-06-12 – 2014-06-18 (×7): 40 mg via SUBCUTANEOUS
  Filled 2014-06-12 (×9): qty 0.4

## 2014-06-12 MED ORDER — METHYLPREDNISOLONE SODIUM SUCC 125 MG IJ SOLR
60.0000 mg | Freq: Four times a day (QID) | INTRAMUSCULAR | Status: DC
  Administered 2014-06-12 – 2014-06-13 (×4): 60 mg via INTRAVENOUS
  Filled 2014-06-12 (×7): qty 0.96

## 2014-06-12 MED ORDER — HYDRALAZINE HCL 20 MG/ML IJ SOLN
INTRAMUSCULAR | Status: AC
  Filled 2014-06-12: qty 1

## 2014-06-12 MED ORDER — LEVOTHYROXINE SODIUM 100 MCG IV SOLR
25.0000 ug | Freq: Every day | INTRAVENOUS | Status: DC
  Administered 2014-06-13: 25 ug via INTRAVENOUS
  Filled 2014-06-12: qty 5

## 2014-06-12 NOTE — Telephone Encounter (Signed)
Spoke w/Tasha and MRI was cancelled due to pt being on BiPAP.

## 2014-06-12 NOTE — Consult Note (Signed)
Neurology Consultation Reason for Consult: Altered mental status Referring Physician: Ward, came  CC: Altered metal status, possible seizure  History is obtained from: Husband, referring provider  HPI: [name] [name] is a 73 y.o. female with a history of stroke causing left-sided weakness who presents with altered mental status that was present on awakening. Her husband states that after he woke up, and she was acting abnormal say that she did not want to get up or take her medicines. She then continued to be in bed until her health aid arrived at which point the aid noticed that she was very difficult to wake up and called 911. En route, she had some abnormal movements concerning for posturing by EMS and possible seizure activity. This lasted for approximately 3 minutes. On arrival, she was acidotic with elevated CO2.   LKW: 8/6 prior to going to bed tpa given?: no, outside of window    ROS:  Unable to obtain due to altered mental status.   Past Medical History  Diagnosis Date  . Hypertension   . Diabetes mellitus   . Chronic renal insufficiency   . Hyperlipidemia   . Osteopenia   . CVA (cerebral infarction)     Family History: Unable to assess secondary to patient's altered mental status.    Social History: Tob: Unable to assess secondary to patient's altered mental status.    Exam: Current vital signs: BP 151/52  Pulse 64  Temp(Src) 97.3 F (36.3 C) (Temporal)  Resp 32  SpO2 100% Vital signs in last 24 hours: Temp:  [97.3 F (36.3 C)] 97.3 F (36.3 C) (08/07 1044) Pulse Rate:  [64-88] 64 (08/07 1330) Resp:  [13-41] 32 (08/07 1330) BP: (116-224)/(34-96) 151/52 mmHg (08/07 1330) SpO2:  [100 %] 100 % (08/07 1330) FiO2 (%):  [40 %-60 %] 40 % (08/07 1200)  General: In bed, BiPAP in place CV: Regular rate and rhythm Mental Status: Patient opens eyes to voice, appears to fixate. She follows commands to squeeze hands bilaterally, but not more complicated  commands. Cranial Nerves: II: Does not participate in visual field testing, keeps eyes tightly closed, does not clearly blink to threat from either direction. Pupils are equal, round, and reactive to light.  Discs are difficult to visualize. III,IV, VI: Eyes are relatively midline, does appear to track to both sides and completely do to chemosis.  V: VII: Difficult to assess facial symmetry due to BiPAP, does blink I would stimulation bilaterally VIII, X, XI, XII: Unable to assess secondary to patient's altered mental status.  Motor: Tone is normal. Bulk is normal. Moves right side spontaneously and withdrawals to pain. She also follows commands, she apparently has a left hemiparesis, but does squeeze fingers with at least 3/5 strength Sensory: Response to noxious stimuli x4 Deep Tendon Reflexes: 2+ and symmetric in the biceps and patellae.  Plantars: Toes are downgoing bilaterally.  Cerebellar: Unable to assess secondary to patient's altered mental status.  Gait: Unable to assess secondary to patient's altered mental status.      I have reviewed labs in epic and the results pertinent to this consultation are: CMP-unremarkable  I have reviewed the images obtained: CT head-no acute findings  Impression: 73 year old female with altered mental status in the setting of hypercarbia. It is unclear to me if the event witnessed by EMS to the was seizure, though she is at risk for seizures given her previous stroke. Her altered mental status could be post-ictal or a toxic/metabolic encephalopathy.  Recommendations: 1) EEG  2) MRI brain 3) address metabolic issues including hyperglycemia and respiratory compromise requiring BiPap.  4) Ammonia, TSH.  Ritta Slot, MD [facility] [phone]  If 7pm- 7am, please page neurology on call as listed in AMION.

## 2014-06-12 NOTE — Progress Notes (Signed)
EEG completed; results pending.    

## 2014-06-12 NOTE — ED Notes (Signed)
Pt continues to respond to loud verbal stimuli, pt tolerating bipap. Plan to recheck gas in half hour.

## 2014-06-12 NOTE — ED Notes (Signed)
Bedside EEG in progress.

## 2014-06-12 NOTE — Progress Notes (Signed)
ABG results reported to MD.  

## 2014-06-12 NOTE — ED Notes (Signed)
Inserted foley catheter with verbal order from Dr. Elesa MassedWard. Assistance with insertion from PeculiarSarah, [place].

## 2014-06-12 NOTE — ED Notes (Signed)
Pt arousable during neuro assessment. No new changes nor difference noted from previous exam.  Pt speaking in clear complete sentences with BiPAP.

## 2014-06-12 NOTE — Progress Notes (Signed)
MD notified of Critical ABG results.

## 2014-06-12 NOTE — ED Notes (Signed)
Neurology MD at bedside with patient

## 2014-06-12 NOTE — H&P (Signed)
[facility] History and Physical  [name]:[id number] DOB: 1941/09/16 DOA: 06/12/2014  Referring physician: EDP PCP: Alva Garnet., MD   Chief Complaint: brought in by EMS for altered mental status.   HPI: [name] [name] is a 73 y.o. female with prior h/o hypertension, DM, CVA, stage 2 CKd, was brought in by EMS for altered mental status. Pt is on bIPAP able to answer some simple questions, but could not tell me detailed history. Her husband is at bedside and gave [place] limited history. As per the husband patient did not get out of bed this am, did not want to take medications, and was acting weird. Speech therapist came in and called 911 ., on arrival to ED, she was found to be in respiratory acidosis with Ph 7.1 and Pco2 of 75.5. She was put on BIPAP and her repeat abg showed an improvement in PH of 7.265 and PCO2 of 59.3. Initial CT head showed chronic changes. CXR showed pneumonitis. She was referred to medical service for admission,. Neurology consulted and EEG done.    Review of Systems:  Could not be obtained.   Past Medical History  Diagnosis Date  . Hypertension   . Diabetes mellitus   . Chronic renal insufficiency   . Hyperlipidemia   . Osteopenia   . CVA (cerebral infarction)    Past Surgical History  Procedure Laterality Date  . Eye surgery    . Loop recorder implant  03-24-2014    MDT LinQ implanted by Dr Johney Frame for cryptogenic stroke  . Tee without cardioversion N/A 03/23/2014    Procedure: TRANSESOPHAGEAL ECHOCARDIOGRAM (TEE);  Surgeon: Lars Masson, MD;  Location: [facility] ENDOSCOPY;  Service: Cardiovascular;  Laterality: N/A;   Social History:  reports that she has been smoking Cigarettes.  She has a 30 pack-year smoking history. She does not have any smokeless tobacco history on file. She reports that she drinks alcohol. She reports that she does not use illicit drugs.  Allergies  Allergen Reactions  . Erythromycin Shortness Of Breath  . Iodine  Hives  . Penicillins Hives    Family History  Problem Relation Age of Onset  . Diabetes Mother   . Hypertension Mother      Prior to Admission medications   Medication Sig Start Date End Date Taking? Authorizing Provider  atorvastatin (LIPITOR) 40 MG tablet Take 40 mg by mouth daily.    Historical Provider, MD  benzonatate (TESSALON) 200 MG capsule Take 200 mg by mouth 3 (three) times daily as needed for cough.    Historical Provider, MD  bimatoprost (LUMIGAN) 0.01 % SOLN Place 1 drop into both eyes at bedtime. 03/24/14   Shanker Levora Dredge, MD  carvedilol (COREG) 25 MG tablet Take 1 tablet (25 mg total) by mouth 2 (two) times daily with a meal. 05/01/14   Richarda Overlie, MD  Chlorpheniramine Maleate (ALLERGY RELIEF PO) Take 1 tablet by mouth daily as needed (for allergies).    Historical Provider, MD  Cholecalciferol (VITAMIN D3) 2000 UNITS TABS Take 2,000 Units by mouth daily. 03/24/14   Shanker Levora Dredge, MD  clopidogrel (PLAVIX) 75 MG tablet Take 1 tablet (75 mg total) by mouth daily with breakfast. 03/24/14   Maretta Bees, MD  doxycycline (VIBRA-TABS) 100 MG tablet Take 100 mg by mouth 2 (two) times daily. 7 day course started 06/09/14 06/08/14   Historical Provider, MD  furosemide (LASIX) 20 MG tablet Take 20 mg by mouth daily.  04/03/14   Historical Provider,  MD  gabapentin (NEURONTIN) 300 MG capsule Take 1 capsule (300 mg total) by mouth 2 (two) times daily. 03/24/14   Shanker Levora DredgeM Ghimire, MD  guaiFENesin-dextromethorphan (ROBITUSSIN DM) 100-10 MG/5ML syrup Take 5 mLs by mouth every 4 (four) hours as needed for cough. 05/01/14   Richarda OverlieNayana Abrol, MD  hydrALAZINE (APRESOLINE) 25 MG tablet Take 25 mg by mouth 3 (three) times daily. Take with a 50 mg tablet for a 75 mg dose    Historical Provider, MD  hydrALAZINE (APRESOLINE) 50 MG tablet Take 50 mg by mouth 3 (three) times daily. Take with a 25 mg tablet for a 75 mg dose    Historical Provider, MD  insulin aspart (NOVOLOG) 100 UNIT/ML injection  Inject 0-20 Units into the skin 3 (three) times daily with meals. 05/04/14   Richarda OverlieNayana Abrol, MD  insulin NPH-regular Human (NOVOLIN 70/30) (70-30) 100 UNIT/ML injection Inject 15 Units into the skin 2 (two) times daily with a meal. 03/24/14   Shanker Levora DredgeM Ghimire, MD  levothyroxine (SYNTHROID, LEVOTHROID) 50 MCG tablet Take 1 tablet (50 mcg total) by mouth daily. 03/24/14   Shanker Levora DredgeM Ghimire, MD  NOVOLIN N 100 UNIT/ML injection  05/25/14   Historical Provider, MD  Olopatadine HCl (PATADAY) 0.2 % SOLN Place 1 drop into both eyes daily. 03/24/14   Maretta BeesShanker M Ghimire, MD   Physical Exam: Filed Vitals:   06/12/14 1245 06/12/14 1300 06/12/14 1315 06/12/14 1330  BP: 116/53 149/57 153/62 151/52  Pulse: 71 66 67 64  Temp:      TempSrc:      Resp: 17 25 24  32  SpO2: 100% 100% 100% 100%    Wt Readings from Last 3 Encounters:  05/21/14 73.029 kg (161 lb)  05/03/14 72.9 kg (160 lb 11.5 oz)  03/23/14 80.786 kg (178 lb 1.6 oz)    General:  Appears calm and comfortable Eyes: PERRL, swollen eye lids.  Neck: no LAD, masses or thyromegaly Cardiovascular: RRR, no m/r/g.  Respiratory: diminished air entry at bases. Scattered rhonchi.  Abdomen: soft, nt, nd Skin: no rash or induration seen on limited exam Musculoskeletal: 2+ pedal edema.  Neurologic: confused and detailed neurological exam could not be obtained.           Labs on Admission:  Basic Metabolic Panel:  Recent Labs Lab 06/09/14 1335 06/12/14 1134 06/12/14 1158  NA 142 140 141  K 4.2 3.8 3.6*  CL 104 104 103  CO2 29 24  --   GLUCOSE 205* 329* 357*  BUN 17 18 18   CREATININE 1.28* 1.06 1.20*  CALCIUM 8.4 8.1*  --    Liver Function Tests:  Recent Labs Lab 06/12/14 1134  AST 17  ALT 21  ALKPHOS 81  BILITOT 0.3  PROT 6.5  ALBUMIN 2.8*   No results found for this basename: LIPASE, AMYLASE,  in the last 168 hours No results found for this basename: AMMONIA,  in the last 168 hours CBC:  Recent Labs Lab 06/09/14 1335  06/12/14 1134 06/12/14 1158  WBC 3.9* 8.8  --   NEUTROABS 2.2 8.3*  --   HGB 9.2* 9.1* 10.5*  HCT 28.2* 28.7* 31.0*  MCV 92.2 92.0  --   PLT 180 188  --    Cardiac Enzymes: No results found for this basename: CKTOTAL, CKMB, CKMBINDEX, TROPONINI,  in the last 168 hours  BNP (last 3 results)  Recent Labs  06/09/14 1344 06/12/14 1330  PROBNP 1271.0* 1666.0*   CBG:  Recent Labs Lab 06/09/14 1235  06/09/14 1346 06/12/14 1111 06/12/14 1226 06/12/14 1309  GLUCAP 219* 184* 319* 227* 254*    Radiological Exams on Admission: Ct Head (brain) Wo Contrast  06/12/2014   CLINICAL DATA:  Weakness and respiratory arrest.  EXAM: CT HEAD WITHOUT CONTRAST  TECHNIQUE: Contiguous axial images were obtained from the base of the skull through the vertex without intravenous contrast.  COMPARISON:  05/01/2014  FINDINGS: There is no evidence of acute cortical infarct, intracranial hemorrhage, mass, midline shift, or extra-axial fluid collection. There is mild generalized cerebral atrophy, unchanged. Patchy and confluent periventricular white matter hypodensities do not appear significantly changed and are compatible with moderate chronic small vessel ischemic disease. Old lacunar infarcts are again seen in the basal ganglia and thalami bilaterally. Old bilateral cerebellar infarcts are also again seen.  Prior bilateral cataract extraction is noted. Mastoid air cells and paranasal sinuses are clear.  IMPRESSION: 1. No evidence of acute intracranial abnormality. 2. Unchanged chronic small vessel ischemic disease and old infarcts.   Electronically Signed   By: Sebastian Ache   On: 06/12/2014 11:02   Dg Chest Port 1 View  06/12/2014   CLINICAL DATA:  Respiratory distress.  EXAM: PORTABLE CHEST - 1 VIEW  COMPARISON:  06/09/2014 P  FINDINGS: Mediastinum and hilar structures normal. Heart size normal. Pulmonary vascularity normal. Mild interstitial prominence bilaterally. Mild pneumonitis cannot be excluded. No  pleural effusion or pneumothorax. No acute bony abnormality.  IMPRESSION: 1. Mild interstitial prominence bilaterally. This suggests mild pneumonitis. 2. Heart size normal.   Electronically Signed   By: Maisie Fus  Register   On: 06/12/2014 12:20    EKG: pending.   Assessment/Plan Active Problems:   Respiratory acidosis   Encephalopathy acute   Acute encephalopathy: Admit to step down, . Her encephalopathy probably secondary to narcosis from hypercapnic respiratory failure. Differentials include seizures, vs CVA.   BIPAP tonight, . Monitor her mental status.  EEG, initial CT head negative for acute stroke or bleed.  Neurology consulted and recommendations given.    Acute hypercapnic respiratory failure:  - secondary to copd exacerbation.  Solumedrol IV, nebs every 4 hours. Smoking cessation.  bipap overnight. Repeat CXR in am.    Accelerated hypertension; Patient cannot take any po as she is encephalopathic. Will put her on PRN hydralalzine.    Stage 1 CKD: Appears to be at baseline.    Uncontrolled Diabetes Mellitus: - SSI. - resume home novolog 70/30.  DIET: Speech and swallow evaluation in am and NPO now.    Hypokalemia; replete as needed.   Anemia:  - normocytic. Appears to be at baseline. Continue to monitor.   Hypothyroidism: - tsh, free t4. On synthroid.   H/o CVA: - continue with plavix. But since she is NPO, she will miss her dose today.   DVT prophylaxis.      Code Status: full code. DVT Prophylaxis: lovenox Family Communication: husband at bedside Disposition Plan: admit to step down.   Time spent: 75 minutes.   [facility] Pager [phone]  **Disclaimer: This note may have been dictated with voice recognition software. Similar sounding words can inadvertently be transcribed and this note may contain transcription errors which may not have been corrected upon publication of note.**

## 2014-06-12 NOTE — ED Notes (Signed)
CBG 319 reported to nurse

## 2014-06-12 NOTE — Progress Notes (Signed)
Updated Dr. Blake DivineAKula on pt's status and Blood pressure. Received prn medication iv for b/p and giving. Will monitor. Pt is still resting with bipap on .

## 2014-06-12 NOTE — ED Notes (Signed)
Pts CBG 254 reported to nurse.

## 2014-06-12 NOTE — ED Provider Notes (Signed)
TIME SEEN: 11:00 AM  CHIEF COMPLAINT: Possible seizure, altered mental status, bradycardia, hypertension  HPI: Patient is a 73 year old female with history of hypertension, diabetes, chronic kidney disease, hyperlipidemia, prior CVA with left sided hemiparesis who presents to the emergency department after she was found altered by her family and speech therapy today. Unable to obtain history from patient and patient's husband is extremely poor historian. Patient's husband reports that he lives or her normal at 8:30 PM last night when she went to bed. He states this morning when she woke up she was not as interactive as normal but was verbal but not out of bed. When speech therapy riding her house and noticed she was leaning to the right and not acting normally and they called EMS. EMS states that while going to the ambulance, patient had seizure-like activity that lasted approximately 3 minutes and was described as posturing. During this episode she became bradycardic into the 20s. She was paced for approximately 5 minutes. They did not give medication for seizures or atropine. They state her blood pressure was 240/130s. They were having to assist her breathing with bvm. Glucose was EMS was in the 350s.   Patient is on Plavix.  ROS: Unable to obtain secondary to altered mental status  PAST MEDICAL HISTORY/PAST SURGICAL HISTORY:  Past Medical History  Diagnosis Date  . Hypertension   . Diabetes mellitus   . Chronic renal insufficiency   . Hyperlipidemia   . Osteopenia   . CVA (cerebral infarction)     MEDICATIONS:  Prior to Admission medications   Medication Sig Start Date End Date Taking? Authorizing Provider  atorvastatin (LIPITOR) 40 MG tablet Take 40 mg by mouth daily.    Historical Provider, MD  benzonatate (TESSALON) 200 MG capsule Take 200 mg by mouth 3 (three) times daily as needed for cough.    Historical Provider, MD  bimatoprost (LUMIGAN) 0.01 % SOLN Place 1 drop into both eyes at  bedtime. 03/24/14   Shanker Levora Dredge, MD  carvedilol (COREG) 25 MG tablet Take 1 tablet (25 mg total) by mouth 2 (two) times daily with a meal. 05/01/14   Richarda Overlie, MD  Chlorpheniramine Maleate (ALLERGY RELIEF PO) Take 1 tablet by mouth daily as needed (for allergies).    Historical Provider, MD  Cholecalciferol (VITAMIN D3) 2000 UNITS TABS Take 2,000 Units by mouth daily. 03/24/14   Shanker Levora Dredge, MD  clopidogrel (PLAVIX) 75 MG tablet Take 1 tablet (75 mg total) by mouth daily with breakfast. 03/24/14   Maretta Bees, MD  doxycycline (VIBRA-TABS) 100 MG tablet Take 100 mg by mouth 2 (two) times daily. 7 day course started 06/09/14 06/08/14   Historical Provider, MD  furosemide (LASIX) 20 MG tablet Take 20 mg by mouth daily.  04/03/14   Historical Provider, MD  gabapentin (NEURONTIN) 300 MG capsule Take 1 capsule (300 mg total) by mouth 2 (two) times daily. 03/24/14   Shanker Levora Dredge, MD  guaiFENesin-dextromethorphan (ROBITUSSIN DM) 100-10 MG/5ML syrup Take 5 mLs by mouth every 4 (four) hours as needed for cough. 05/01/14   Richarda Overlie, MD  hydrALAZINE (APRESOLINE) 25 MG tablet Take 25 mg by mouth 3 (three) times daily. Take with a 50 mg tablet for a 75 mg dose    Historical Provider, MD  hydrALAZINE (APRESOLINE) 50 MG tablet Take 50 mg by mouth 3 (three) times daily. Take with a 25 mg tablet for a 75 mg dose    Historical Provider, MD  insulin aspart (  NOVOLOG) 100 UNIT/ML injection Inject 0-20 Units into the skin 3 (three) times daily with meals. 05/04/14   Richarda Overlie, MD  insulin NPH-regular Human (NOVOLIN 70/30) (70-30) 100 UNIT/ML injection Inject 15 Units into the skin 2 (two) times daily with a meal. 03/24/14   Shanker Levora Dredge, MD  levothyroxine (SYNTHROID, LEVOTHROID) 50 MCG tablet Take 1 tablet (50 mcg total) by mouth daily. 03/24/14   Shanker Levora Dredge, MD  NOVOLIN N 100 UNIT/ML injection  05/25/14   Historical Provider, MD  Olopatadine HCl (PATADAY) 0.2 % SOLN Place 1 drop into both  eyes daily. 03/24/14   Shanker Levora Dredge, MD    ALLERGIES:  Allergies  Allergen Reactions  . Erythromycin Shortness Of Breath  . Iodine Hives  . Penicillins Hives    SOCIAL HISTORY:  History  Substance Use Topics  . Smoking status: Current Every Day Smoker -- 0.50 packs/day for 60 years    Types: Cigarettes  . Smokeless tobacco: Not on file     Comment: pt states "sometimes I quit smoking for about 2 weeks then I start back"  . Alcohol Use: Yes     Comment: occasionally    FAMILY HISTORY: Family History  Problem Relation Age of Onset  . Diabetes Mother   . Hypertension Mother     EXAM: BP 194/71  Pulse 86  Temp(Src) 97.3 F (36.3 C) (Temporal)  Resp 34  SpO2 100% CONSTITUTIONAL: Patient will open her eyes to verbal stimuli and painful stimuli, she will move her right upper and lower extremity minimally, nonverbal, GCS 9 HEAD: Normocephalic EYES: Conjunctivae clear, pupils are pinpoint bilaterally, bilateral chemosis, patient has bilateral swelling of her periorbital area without ecchymosis ENT: normal nose; no rhinorrhea; dry mucous membranes; pharynx without lesions noted, possible right tongue deviation NECK: Supple, no meningismus, no LAD  CARD: RRR; S1 and S2 appreciated; no murmurs, no clicks, no rubs, no gallops RESP: Normal chest excursion without splinting, patient is tachypneic with mild wheezing and rales, no rhonchi, decreased aeration ABD/GI: Normal bowel sounds; non-distended; soft, non-tender, no rebound, no guarding BACK:  The back appears normal and is non-tender to palpation, there is no CVA tenderness EXT: Normal ROM in all joints; non-tender to palpation; no edema; normal capillary refill; no cyanosis    SKIN: Normal color for age and race; warm NEURO: He she'll open her eyes to verbal stimuli and painful stimuli, she will follow commands with her right upper and lower extremity and moves them spontaneously but not significantly, right tongue  deviation, nonverbal PSYCH: The patient's mood and manner are appropriate. Grooming and personal hygiene are appropriate.  MEDICAL DECISION MAKING: Patient here with possible seizure versus stroke. She also appears to be in some respiratory distress, husband reports she was a smoker until 2 weeks ago. She is extremely hypertensive and hyperglycemic. She is slowly improving, GCS greater than 8. Oxygen saturation on a nonrebreather is 100%. Will continue to closely monitor as she may need intubation. Husband confirms that she is a full code.  Patient given Narcan given her pinpoint pupils with no response.  We'll obtain labs, urine, CT head, chest x-ray. We'll give medication for hypertension she may have an intracranial hemorrhage.  ED PROGRESS: Patient taken emergently to head CT. No acute stroke or hemorrhage seen on CT scan. She continues to slowly improve and is still nonverbal. She will not open her eyes spontaneously and localizes with her right upper extremity. No seizure-like activity. ABG reveals a mixed metabolic and respiratory  acidosis. We'll start BiPAP, albuterol treatments. Blood pressure is slightly improved with IV hydralazine. We'll give IV labetalol. We'll also give IV insulin.   Patient's blood pressure has markedly improved with IV hydralazine and labetalol. Her glucoses also the 200s now after insulin. Labs and urine are otherwise unremarkable. Chest x-ray clear urine shows no sign of infection. Urine drug screen negative.   Patient has been on BiPAP over one hour. Her pH is improved from 7.19 to 7.265. Her PCO2 is improved from 75 to 59. I do not feel this time she needs to be intubated her GCS is greater than 10 and she is oxygenating well and her pH and PCO2 are improving. Discussed with hospitalist for admission to step down.      CRITICAL CARE Performed by: Raelyn NumberWARD, Zakeria Kulzer N   Total critical care time: 60 minutes  Critical care time was exclusive of separately  billable procedures and treating other patients.  Critical care was necessary to treat or prevent imminent or life-threatening deterioration.  Critical care was time spent personally by me on the following activities: development of treatment plan with patient and/or surrogate as well as nursing, discussions with consultants, evaluation of patient's response to treatment, examination of patient, obtaining history from patient or surrogate, ordering and performing treatments and interventions, ordering and review of laboratory studies, ordering and review of radiographic studies, pulse oximetry and re-evaluation of patient's condition.       Date: 06/12/2014 10:44 AM  Rate: 83  Rhythm: normal sinus rhythm  QRS Axis: normal  Intervals: normal  ST/T Wave abnormalities: normal  Conduction Disutrbances: none  Narrative Interpretation: unremarkable; baseline wander, nonspecific ST changes in inferior and lateral leads       Layla MawKristen N Vittorio Mohs, DO 06/12/14 1532

## 2014-06-12 NOTE — Progress Notes (Signed)
Pt got here from ED with nurse and respiratory therapist. Pt is on Bipap. Pt is lethargic and asleep. Pt wakes to pain and movement. Central telemetry notified. ELink notified. Pt's blood pressure is high systolic in 180's paging MD. CHG bath given and mrsa swab sent to lab.

## 2014-06-12 NOTE — Telephone Encounter (Signed)
New message     Pt is now at [facility].  She needs an MRI of the brain.  She has a loop recorder.  They need to talk to the nurse before she has her MRI.  Please call.

## 2014-06-12 NOTE — Procedures (Signed)
History:  73 year old female with altered mental status, possible seizure  Sedation: None  Technique: This is a 17 channel routine scalp EEG performed at the bedside with bipolar and monopolar montages arranged in accordance to the international 10/20 system of electrode placement. One channel was dedicated to EKG recording.    Background: The background consists of intermixed irregular delta and theta activity. With noxious stimulation, there is an increase in theta activities with a poorly formed posterior rhythm theta range.  Photic stimulation: Physiologic driving is not performed  EEG Abnormalities: 1) generalized irregular slow activity  Clinical Interpretation: This EEG is consistent with a nonspecific generalized cerebral dysfunction. There was no seizure or seizure predisposition recorded on this study.   Ritta SlotMcNeill Alauna Hayden, MD [facility] [phone]  If 7pm- 7am, please page neurology on call as listed in AMION.

## 2014-06-12 NOTE — ED Notes (Signed)
Pt arrives via EMs from home went to bed last night around 830pm. Found this AM by speech therapist this AM in bed slumped to the right. EMS was called. On arrival pt was walking to stretcher and appeared to have seizure like activity, right sided gaze agonal breathing approx 25sec. Pt brady'ed to 26, paced approx . Bagged for agonal breathing. Attempted intbation in the field. +gag.

## 2014-06-12 NOTE — ED Notes (Signed)
pts CBG was 280 reported to nurse. Pt remains monitored by 12 lead, blood pressure, and pulse ox.

## 2014-06-12 NOTE — ED Notes (Signed)
Pts CBG was 227 reported to nurse.

## 2014-06-13 ENCOUNTER — Inpatient Hospital Stay (HOSPITAL_COMMUNITY): Payer: Medicare HMO

## 2014-06-13 LAB — BASIC METABOLIC PANEL
ANION GAP: 11 (ref 5–15)
BUN: 21 mg/dL (ref 6–23)
CO2: 25 mEq/L (ref 19–32)
CREATININE: 1.25 mg/dL — AB (ref 0.50–1.10)
Calcium: 8.4 mg/dL (ref 8.4–10.5)
Chloride: 107 mEq/L (ref 96–112)
GFR calc non Af Amer: 41 mL/min — ABNORMAL LOW (ref >90)
GFR, EST AFRICAN AMERICAN: 48 mL/min — AB (ref >90)
Glucose, Bld: 113 mg/dL — ABNORMAL HIGH (ref 70–99)
Potassium: 4.3 mEq/L (ref 3.7–5.3)
SODIUM: 143 meq/L (ref 137–147)

## 2014-06-13 LAB — BLOOD GAS, ARTERIAL
Acid-Base Excess: 1.7 mmol/L (ref 0.0–2.0)
BICARBONATE: 26.7 meq/L — AB (ref 20.0–24.0)
Delivery systems: POSITIVE
Drawn by: 41308
Expiratory PAP: 5
FIO2: 100 %
Inspiratory PAP: 13
O2 Saturation: 99.9 %
PCO2 ART: 49.3 mmHg — AB (ref 35.0–45.0)
PH ART: 7.353 (ref 7.350–7.450)
PO2 ART: 257 mmHg — AB (ref 80.0–100.0)
Patient temperature: 98.6
RATE: 15 resp/min
TCO2: 28.2 mmol/L (ref 0–100)

## 2014-06-13 LAB — CBC
HCT: 27.3 % — ABNORMAL LOW (ref 36.0–46.0)
Hemoglobin: 8.8 g/dL — ABNORMAL LOW (ref 12.0–15.0)
MCH: 30.1 pg (ref 26.0–34.0)
MCHC: 32.2 g/dL (ref 30.0–36.0)
MCV: 93.5 fL (ref 78.0–100.0)
Platelets: 164 10*3/uL (ref 150–400)
RBC: 2.92 MIL/uL — ABNORMAL LOW (ref 3.87–5.11)
RDW: 13.6 % (ref 11.5–15.5)
WBC: 4.6 10*3/uL (ref 4.0–10.5)

## 2014-06-13 LAB — HEMOGLOBIN A1C
HEMOGLOBIN A1C: 7.7 % — AB (ref <5.7)
Mean Plasma Glucose: 174 mg/dL — ABNORMAL HIGH (ref <117)

## 2014-06-13 LAB — TSH: TSH: 0.672 u[IU]/mL (ref 0.350–4.500)

## 2014-06-13 LAB — GLUCOSE, CAPILLARY
GLUCOSE-CAPILLARY: 140 mg/dL — AB (ref 70–99)
GLUCOSE-CAPILLARY: 150 mg/dL — AB (ref 70–99)
GLUCOSE-CAPILLARY: 156 mg/dL — AB (ref 70–99)
GLUCOSE-CAPILLARY: 186 mg/dL — AB (ref 70–99)
Glucose-Capillary: 116 mg/dL — ABNORMAL HIGH (ref 70–99)
Glucose-Capillary: 246 mg/dL — ABNORMAL HIGH (ref 70–99)

## 2014-06-13 LAB — T4, FREE: Free T4: 1.01 ng/dL (ref 0.80–1.80)

## 2014-06-13 MED ORDER — LATANOPROST 0.005 % OP SOLN
1.0000 [drp] | Freq: Every day | OPHTHALMIC | Status: DC
  Administered 2014-06-13 – 2014-06-18 (×6): 1 [drp] via OPHTHALMIC
  Filled 2014-06-13 (×2): qty 2.5

## 2014-06-13 MED ORDER — LEVOFLOXACIN IN D5W 250 MG/50ML IV SOLN
250.0000 mg | INTRAVENOUS | Status: DC
  Administered 2014-06-13: 250 mg via INTRAVENOUS
  Filled 2014-06-13 (×2): qty 50

## 2014-06-13 MED ORDER — METHYLPREDNISOLONE SODIUM SUCC 125 MG IJ SOLR
60.0000 mg | Freq: Two times a day (BID) | INTRAMUSCULAR | Status: AC
  Administered 2014-06-13 – 2014-06-14 (×3): 60 mg via INTRAVENOUS
  Filled 2014-06-13 (×3): qty 0.96

## 2014-06-13 MED ORDER — ALBUTEROL SULFATE (2.5 MG/3ML) 0.083% IN NEBU
2.5000 mg | INHALATION_SOLUTION | RESPIRATORY_TRACT | Status: DC | PRN
  Administered 2014-06-16 – 2014-06-17 (×2): 2.5 mg via RESPIRATORY_TRACT
  Filled 2014-06-13 (×2): qty 3

## 2014-06-13 MED ORDER — BENZONATATE 100 MG PO CAPS
200.0000 mg | ORAL_CAPSULE | Freq: Three times a day (TID) | ORAL | Status: DC | PRN
  Administered 2014-06-14 – 2014-06-17 (×4): 200 mg via ORAL
  Filled 2014-06-13 (×4): qty 2

## 2014-06-13 MED ORDER — IPRATROPIUM-ALBUTEROL 0.5-2.5 (3) MG/3ML IN SOLN
3.0000 mL | Freq: Four times a day (QID) | RESPIRATORY_TRACT | Status: DC
  Administered 2014-06-13 – 2014-06-15 (×7): 3 mL via RESPIRATORY_TRACT
  Filled 2014-06-13 (×7): qty 3

## 2014-06-13 MED ORDER — ATORVASTATIN CALCIUM 40 MG PO TABS
40.0000 mg | ORAL_TABLET | Freq: Every day | ORAL | Status: DC
  Administered 2014-06-13 – 2014-06-19 (×7): 40 mg via ORAL
  Filled 2014-06-13 (×7): qty 1

## 2014-06-13 MED ORDER — INSULIN ASPART 100 UNIT/ML ~~LOC~~ SOLN
0.0000 [IU] | Freq: Three times a day (TID) | SUBCUTANEOUS | Status: DC
  Administered 2014-06-13 – 2014-06-14 (×2): 5 [IU] via SUBCUTANEOUS
  Administered 2014-06-14: 11 [IU] via SUBCUTANEOUS
  Administered 2014-06-15 – 2014-06-16 (×2): 2 [IU] via SUBCUTANEOUS
  Administered 2014-06-16: 3 [IU] via SUBCUTANEOUS

## 2014-06-13 MED ORDER — LEVOTHYROXINE SODIUM 50 MCG PO TABS
50.0000 ug | ORAL_TABLET | Freq: Every day | ORAL | Status: DC
  Administered 2014-06-14 – 2014-06-19 (×6): 50 ug via ORAL
  Filled 2014-06-13 (×8): qty 1

## 2014-06-13 MED ORDER — SODIUM CHLORIDE 0.9 % IV SOLN
INTRAVENOUS | Status: DC
  Administered 2014-06-14 – 2014-06-15 (×2): via INTRAVENOUS

## 2014-06-13 NOTE — Progress Notes (Signed)
[place] TEAM 1 - Stepdown/ICU TEAM Progress Note  BLAYRE PAPANIA ZOX:[id number] DOB: Feb 10, 1941 DOA: 06/12/2014 PCP: Alva Garnet., MD  Admit HPI / Brief Narrative: 73 yo F with prior h/o hypertension, DM, CVA, and stage 2 CKD, who was brought in by EMS for altered mental status.  As per the husband patient did not get out of bed, did not want to take medications, and was acting weird. Speech therapist came in to visit the pt and called 911.    On arrival to ED, she was found to be in respiratory acidosis with pH 7.1 and Pco2 of 75.5. She was put on BIPAP and her repeat abg showed an improvement in pH of 7.265 and PCO2 of 59.3. Initial CT head showed chronic changes. CXR showed mild interstitial prominence B suggestive of pneumonitis.   HPI/Subjective: Pt is now alert and conversant, but states she feels "terrible."  She c/o cough and sob primarily.  She denies cp, n/v, or abdom pain.  There are multiple family members present in the room.    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 rate 28, height 5' (1.524 m), weight 81.647 kg (180 lb), SpO2 100.00%.  Intake/Output Summary (Last 24 hours) at 06/13/14 0857 Last data filed at 06/13/14 0348  Gross per 24 hour  Intake    100 ml  Output    400 ml  Net   -300 ml   Exam: General: No acute respiratory distress at rest - appears anxious  Lungs: poor air movement th/o all fields - no active wheeze - no focal crackles  Cardiovascular: Regular rate and rhythm without murmur gallop or rub - distant HS  Abdomen: Nontender, nondistended, soft, bowel sounds positive, no rebound, no ascites, no appreciable mass Extremities: No significant cyanosis, clubbing, or edema bilateral lower extremities  Data Reviewed: Basic Metabolic Panel:  Recent Labs Lab 06/09/14 1335 06/12/14 1134 06/12/14 1158 06/13/14 0353  NA 142 140 141 143  K 4.2 3.8 3.6* 4.3  CL 104 104 103 107  CO2 29 24  --  25  GLUCOSE 205* 329* 357* 113*  BUN 17 18 18 21   CREATININE 1.28* 1.06 1.20* 1.25*  CALCIUM 8.4 8.1*  --  8.4   Liver Function Tests:  Recent Labs Lab 06/12/14 1134  AST 17  ALT 21  ALKPHOS 81  BILITOT 0.3  PROT 6.5  ALBUMIN 2.8*   Coags:  Recent Labs Lab 06/12/14 1134  INR 1.28   No results found for this basename: PTT,  in the last 168 hours  CBC:  Recent Labs Lab 06/09/14 1335 06/12/14 1134 06/12/14 1158 06/13/14 0353  WBC 3.9* 8.8  --  4.6  NEUTROABS 2.2 8.3*  --   --   HGB 9.2* 9.1* 10.5* 8.8*  HCT 28.2* 28.7* 31.0* 27.3*  MCV 92.2 92.0  --  93.5  PLT 180 188  --  164   CBG:  Recent Labs Lab 06/12/14 1804 06/12/14 1944 06/12/14 2343 06/13/14 0340 06/13/14 0756  GLUCAP 302* 210* 156* 116* 150*    Recent Results (from the past 240 hour(s))  MRSA PCR SCREENING     Status: None   Collection  Time    06/12/14  5:24 PM      Result Value Ref Range Status   MRSA by PCR NEGATIVE  NEGATIVE Final   Comment:            The GeneXpert MRSA Assay (FDA     approved for NASAL specimens     only), is one component of a     comprehensive MRSA colonization     surveillance program. It is not     intended to diagnose MRSA     infection nor to guide or     monitor treatment for     MRSA infections.    Studies:  Recent x-ray studies have been reviewed in detail by the Attending Physician  Scheduled Meds:  Scheduled Meds: . sodium chloride   Intravenous STAT  . antiseptic oral rinse  7 mL Mouth Rinse BID  . arformoterol  15 mcg Nebulization BID  . budesonide (PULMICORT) nebulizer solution  0.25 mg Nebulization BID  . carvedilol  25 mg Oral BID WC  . chlorhexidine  15 mL Mouth Rinse BID  . clopidogrel  75 mg Oral Q breakfast  . enoxaparin (LOVENOX) injection  40 mg Subcutaneous Q24H  . gabapentin  300 mg Oral BID  . insulin aspart  0-15 Units Subcutaneous Q4H  . insulin aspart protamine- aspart  15 Units Subcutaneous BID WC  . ipratropium-albuterol  3 mL Nebulization Q4H  . levofloxacin (LEVAQUIN) IV  500 mg Intravenous Q24H  . levothyroxine  25 mcg Intravenous Daily  . methylPREDNISolone (SOLU-MEDROL) injection  60 mg Intravenous Q6H  . sodium chloride  3 mL Intravenous Q12H   Time spent on care of this patient: 35 mins  MCCLUNG,JEFFREY T , MD   [facility] Office  [phone] Pager - Text Page per Loretha StaplerAmion as per below:  On-Call/Text Page:      [email or web address]      password [email or web address]  If 7PM-7AM, please contact night-coverage [email or web address] Password [email or web address] 06/13/2014, 8:57 AM   LOS: 1 day

## 2014-06-13 NOTE — Progress Notes (Signed)
Subjective: Much improved overnight.   Exam: Filed Vitals:   06/13/14 0756  BP: 117/64  Pulse: 74  Temp:   Resp: 28   Gen: In bed, NAD MS: Awake, Alert, interactive and appropriate WU:JWJXBCN:PERRL, EOMI Motor: left hemiparesis(old) Sensory:intact to LT  Impression: 73 year old female with altered mental status in the setting of hypercarbia. It is unclear to me if the event witnessed by EMS to the was seizure, though she is at risk for seizures given her previous stroke. I suspect that her presentation was metabolically driven rather than seizure given the difficult to arouse prodrome. I would not start AEDs at this time unless she had further concerning events.    Recommendations: 1)No further recommendations at this time, please call if further questions or concerns, or if patient does not continue to improve as expected. Neurology will sign off.   Ritta SlotMcNeill Jeslin Bazinet, MD [facility] [phone]  If 7pm- 7am, please page neurology on call as listed in AMION.

## 2014-06-13 NOTE — Evaluation (Signed)
Clinical/Bedside Swallow Evaluation Patient Details  Name: [name] [name] MRN: [id number] Date of Birth: June 17, 1941  Today's Date: 06/13/2014 Time: 0900-0920 SLP Time Calculation (min): 20 min  Past Medical History:  Past Medical History  Diagnosis Date  . Hypertension   . Diabetes mellitus   . Chronic renal insufficiency   . Hyperlipidemia   . Osteopenia   . CVA (cerebral infarction)    Past Surgical History:  Past Surgical History  Procedure Laterality Date  . Eye surgery    . Loop recorder implant  03-24-2014    MDT LinQ implanted by Dr Johney Frame for cryptogenic stroke  . Tee without cardioversion N/A 03/23/2014    Procedure: TRANSESOPHAGEAL ECHOCARDIOGRAM (TEE);  Surgeon: Lars Masson, MD;  Location: [facility] ENDOSCOPY;  Service: Cardiovascular;  Laterality: N/A;   HPI:  73 y.o. female with prior h/o hypertension, DM, CVA, stage 2 CKd, was brought in by EMS for altered mental status. Pt on biPap when SLP entered room to complete BSE; pt tolerated being placed on [address] during BSE, CXR on 06/13/14 indicated improved aeration without significant residual interstitial disease, small left pleural effusion; minimal left basilar airspace disease likely reflects atelectasis. MRI head on 06/12/14 indicated no new changes   Assessment / Plan / Recommendation Clinical Impression  Pt without overt s/s of aspiration with thin-puree consistencies; decreased oral manipulation/propulsion with solid consistency as pt appeared confused and exhibited oral holding with cracker consistency, without ability to propel into oral cavity despite SLP placement cues and eventually pt expelled bolus from mouth; pharyngeal phase of swallow unremarkable with timely swallow and adequate laryngeal elevation via palpation; recommend a conservative diet of Dysphagia 1 (Puree) and thin liquids with small sips/bites d/t decreased respiratory status and confusion; ST to f/u for probable diet upgrade and diet tolerance    Aspiration  Risk  None    Diet Recommendation Dysphagia 1 (Puree);Thin liquid   Liquid Administration via: Cup Medication Administration: Crushed with puree Supervision: Patient able to self feed;Full supervision/cueing for compensatory strategies Compensations: Slow rate;Small sips/bites Postural Changes and/or Swallow Maneuvers: Out of bed for meals;Seated upright 90 degrees;Upright 30-60 min after meal    Other  Recommendations Oral Care Recommendations: Oral care BID   Follow Up Recommendations    ST f/u 1-2x for diet upgrade and tolerance   Frequency and Duration min 2x/week  1 week   Pertinent Vitals/Pain WDL    SLP Swallow Goals  See POC   Swallow Study Prior Functional Status   Independent at home    General Date of Onset: 06/12/14 HPI: 73 y.o. female with prior h/o hypertension, DM, CVA, stage 2 CKd, was brought in by EMS for altered mental status. Pt on biPap when SLP entered room to complete BSE; pt tolerated being placed on [place] during BSE, CXR on 06/13/14 indicated improved aeration without significant residual interstitial disease.Small left pleural effusion; Minimal left basilar airspace disease likely reflects atelectasis. MRI head on 06/12/14 indicated no new changes Type of Study: Bedside swallow evaluation Previous Swallow Assessment: n/a Diet Prior to this Study: NPO Temperature Spikes Noted: No Respiratory Status: Nasal cannula (Pt on biPap when SLP entered room and taken off for BSE) Behavior/Cognition: Alert;Cooperative;Hard of hearing Oral Cavity - Dentition: Edentulous (Pt did not have dentures present for BSE) Self-Feeding Abilities: Able to feed self Patient Positioning: Upright in bed Baseline Vocal Quality: Clear;Low vocal intensity Volitional Cough: Weak Volitional Swallow: Able to elicit    Oral/Motor/Sensory Function Overall Oral Motor/Sensory Function: Appears within functional limits  for tasks assessed   Ice Chips Ice chips: Not tested   Thin Liquid Thin  Liquid: Within functional limits Presentation: Cup;Straw    Nectar Thick Nectar Thick Liquid: Not tested   Honey Thick Honey Thick Liquid: Not tested   Puree Puree: Within functional limits Presentation: Spoon   Solid       Solid: Impaired Presentation: Spoon Oral Phase Impairments: Poor awareness of bolus;Impaired anterior to posterior transit Oral Phase Functional Implications: Oral holding       ADAMS,PAT, M.S., CCC-SLP 06/13/2014,9:34 AM

## 2014-06-13 NOTE — Progress Notes (Signed)
Pt sats are 100% and she does not wish to wear bipap at this time. Bipap will be left in room in case needed or wanted. RT will continue to monitor.

## 2014-06-13 NOTE — Progress Notes (Signed)
CRITICAL VALUE ALERT  Critical value received:  Blood cultures aerobic bottle gram positive cocci in clusters  Date of notification:  06/13/2014   Time of notification:  1925  Critical value read back: yes  Nurse who received alert:  Birdena CrandallAmanda Shonte Soderlund RN  MD notified (1st page): Elray McgregorMary Lynch  Time of first page:  9:35 PM   MD notified (2nd page):  Time of second page:  Responding MD:  Elray McgregorMary Lynch  Time MD responded: 9:37 PM

## 2014-06-14 LAB — CULTURE, BLOOD (ROUTINE X 2)

## 2014-06-14 LAB — CBC
HCT: 27.9 % — ABNORMAL LOW (ref 36.0–46.0)
Hemoglobin: 9 g/dL — ABNORMAL LOW (ref 12.0–15.0)
MCH: 29.9 pg (ref 26.0–34.0)
MCHC: 32.3 g/dL (ref 30.0–36.0)
MCV: 92.7 fL (ref 78.0–100.0)
PLATELETS: 180 10*3/uL (ref 150–400)
RBC: 3.01 MIL/uL — ABNORMAL LOW (ref 3.87–5.11)
RDW: 13.8 % (ref 11.5–15.5)
WBC: 8.9 10*3/uL (ref 4.0–10.5)

## 2014-06-14 LAB — BASIC METABOLIC PANEL
ANION GAP: 13 (ref 5–15)
BUN: 31 mg/dL — ABNORMAL HIGH (ref 6–23)
CO2: 24 mEq/L (ref 19–32)
CREATININE: 1.48 mg/dL — AB (ref 0.50–1.10)
Calcium: 8.3 mg/dL — ABNORMAL LOW (ref 8.4–10.5)
Chloride: 102 mEq/L (ref 96–112)
GFR calc non Af Amer: 33 mL/min — ABNORMAL LOW (ref >90)
GFR, EST AFRICAN AMERICAN: 39 mL/min — AB (ref >90)
Glucose, Bld: 267 mg/dL — ABNORMAL HIGH (ref 70–99)
Potassium: 4.9 mEq/L (ref 3.7–5.3)
Sodium: 139 mEq/L (ref 137–147)

## 2014-06-14 LAB — GLUCOSE, CAPILLARY
Glucose-Capillary: 333 mg/dL — ABNORMAL HIGH (ref 70–99)
Glucose-Capillary: 243 mg/dL — ABNORMAL HIGH (ref 70–99)
Glucose-Capillary: 106 mg/dL — ABNORMAL HIGH (ref 70–99)
Glucose-Capillary: 61 mg/dL — ABNORMAL LOW (ref 70–99)
Glucose-Capillary: 99 mg/dL (ref 70–99)

## 2014-06-14 MED ORDER — LEVOFLOXACIN 250 MG PO TABS
250.0000 mg | ORAL_TABLET | Freq: Every day | ORAL | Status: DC
  Administered 2014-06-14 – 2014-06-19 (×6): 250 mg via ORAL
  Filled 2014-06-14 (×6): qty 1

## 2014-06-14 MED ORDER — HYDRALAZINE HCL 50 MG PO TABS
75.0000 mg | ORAL_TABLET | Freq: Three times a day (TID) | ORAL | Status: DC
  Administered 2014-06-14 – 2014-06-19 (×15): 75 mg via ORAL
  Filled 2014-06-14 (×22): qty 1

## 2014-06-14 MED ORDER — PREDNISONE 20 MG PO TABS
40.0000 mg | ORAL_TABLET | Freq: Two times a day (BID) | ORAL | Status: DC
  Administered 2014-06-15 – 2014-06-16 (×3): 40 mg via ORAL
  Filled 2014-06-14 (×5): qty 2

## 2014-06-14 MED ORDER — INSULIN ASPART PROT & ASPART (70-30 MIX) 100 UNIT/ML ~~LOC~~ SUSP
30.0000 [IU] | Freq: Two times a day (BID) | SUBCUTANEOUS | Status: DC
  Administered 2014-06-14 – 2014-06-16 (×3): 30 [IU] via SUBCUTANEOUS
  Filled 2014-06-14 (×3): qty 10

## 2014-06-14 NOTE — Progress Notes (Signed)
[place] TEAM 1 - Stepdown/ICU TEAM Progress Note  [name] [name] UJW:[id number] DOB: 10/20/1941 DOA: 06/12/2014 PCP: Alva Garnet., MD  Admit HPI / Brief Narrative: 73 yo F with prior h/o hypertension, DM, CVA, and stage 2 CKD, who was brought in by EMS for altered mental status.  As per the husband patient did not get out of bed, did not want to take medications, and was acting weird. Speech therapist came in to visit the pt and called 911.    On arrival to ED, she was found to be in respiratory acidosis with pH 7.1 and Pco2 of 75.5. She was put on BIPAP and her repeat abg showed an improvement in pH of 7.265 and PCO2 of 59.3. Initial CT head showed chronic changes. CXR showed mild interstitial prominence B suggestive of pneumonitis.   HPI/Subjective: Pt cont to c/o cough and sob primarily.  She denies cp, n/v, or abdom pain.    Assessment/Plan:  Acute encephalopathy CT head negative for acute stroke or bleed - due to hypercapnea, likely related to COPD exacerbation, +/- a component of sleep apnea - resolved at this time   Acute hypercapnic respiratory failure - previously undiagnosed COPD Pt states she has quite smoking - cont nebs - wean steroids asap   Coag neg staph in 1/2 blood cx Most c/w a contaminant - follow clinically w/o change in abx tx   HTN BP climbing - titrate BP meds and follow   Uncontrolled DM w/ renal complications  CBG poorly controlled with improved intake + steroids - adjust tx and follow trend   CKD stage III Baseline crt is ~1.12 - crt climbing slightly - follow trend   Hypokalemia Corrected  Normocytic anemia Follow trend - no evidence of bleeding  Hypothyroidism TSH at goal   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 rate 25, height 5' (1.524 m), weight 83 kg (182 lb 15.7 oz), SpO2 100.00%.  Intake/Output Summary (Last 24 hours) at 06/14/14 1337 Last data filed at 06/14/14 1100  Gross per 24 hour  Intake   1330 ml  Output    800 ml  Net    530 ml   Exam: General: No acute respiratory distress at rest - appears anxious - coughs frequently  Lungs: poor air movement th/o all fields - no active wheeze - no focal crackles  Cardiovascular: Regular rate and rhythm without murmur gallop or rub - distant HS  Abdomen: Nontender, nondistended, soft, bowel sounds positive, no rebound, no ascites, no appreciable mass Extremities: No significant cyanosis, clubbing, or edema bilateral lower extremities  Data Reviewed: Basic Metabolic Panel:  Recent Labs Lab 06/09/14 1335 06/12/14 1134 06/12/14 1158 06/13/14 0353 06/14/14 0359  NA 142 140 141 143 139  K 4.2 3.8 3.6* 4.3 4.9  CL 104 104 103 107 102  CO2 29 24  --  25 24  GLUCOSE 205* 329*  357* 113* 267*  BUN 17 18 18 21  31*  CREATININE 1.28* 1.06 1.20* 1.25* 1.48*  CALCIUM 8.4 8.1*  --  8.4 8.3*   Liver Function Tests:  Recent Labs Lab 06/12/14 1134  AST 17  ALT 21  ALKPHOS 81  BILITOT 0.3  PROT 6.5  ALBUMIN 2.8*   Coags:  Recent Labs Lab 06/12/14 1134  INR 1.28   CBC:  Recent Labs Lab 06/09/14 1335 06/12/14 1134 06/12/14 1158 06/13/14 0353 06/14/14 0359  WBC 3.9* 8.8  --  4.6 8.9  NEUTROABS 2.2 8.3*  --   --   --   HGB 9.2* 9.1* 10.5* 8.8* 9.0*  HCT 28.2* 28.7* 31.0* 27.3* 27.9*  MCV 92.2 92.0  --  93.5 92.7  PLT 180 188  --  164 180   CBG:  Recent Labs Lab  06/13/14 1151 06/13/14 1749 06/13/14 2307 06/14/14 0811 06/14/14 1148  GLUCAP 140* 246* 186* 333* 243*    Recent Results (from the past 240 hour(s))  CULTURE, BLOOD (ROUTINE X 2)     Status: None   Collection Time    06/12/14 11:34 AM      Result Value Ref Range Status   Specimen Description BLOOD RIGHT HAND   Final   Special Requests BOTTLES DRAWN AEROBIC AND ANAEROBIC 6CC   Final   Culture  Setup Time     Final   Value: 06/12/2014 17:30     Performed at [facility]   Culture     Final   Value:        BLOOD CULTURE RECEIVED NO GROWTH TO DATE CULTURE WILL BE HELD FOR 5 DAYS BEFORE ISSUING A FINAL NEGATIVE REPORT     Performed at [facility]   Report Status PENDING   Incomplete  CULTURE, BLOOD (ROUTINE X 2)     Status: None   Collection Time    06/12/14 11:50 AM      Result Value Ref Range Status   Specimen Description BLOOD LEFT HAND   Final   Special Requests BOTTLES DRAWN AEROBIC AND ANAEROBIC 5CC   Final   Culture  Setup Time     Final   Value: 06/12/2014 17:30     Performed at [facility]   Culture     Final   Value: STAPHYLOCOCCUS SPECIES (COAGULASE NEGATIVE)     Note: THE SIGNIFICANCE OF ISOLATING THIS ORGANISM FROM A SINGLE SET OF BLOOD CULTURES WHEN MULTIPLE SETS ARE DRAWN IS UNCERTAIN. PLEASE NOTIFY THE MICROBIOLOGY DEPARTMENT WITHIN ONE WEEK IF SPECIATION AND SENSITIVITIES ARE REQUIRED.     Note: Gram Stain Report Called to,Read Back By and Verified With: AMANDA PETTIFORD 06/13/14 @ 9:25PM BY RUSCOE A.     Performed at [facility]   Report Status 06/14/2014 FINAL   Final  MRSA PCR SCREENING     Status: None   Collection Time    06/12/14  5:24 PM      Result Value Ref Range Status   MRSA by PCR NEGATIVE  NEGATIVE Final   Comment:            The GeneXpert MRSA Assay (FDA     approved for NASAL specimens     only), is one component of a     comprehensive MRSA colonization     surveillance program. It is not     intended to  diagnose MRSA     infection nor to guide or     monitor treatment  for     MRSA infections.    Studies:  Recent x-ray studies have been reviewed in detail by the Attending Physician  Scheduled Meds:  Scheduled Meds: . antiseptic oral rinse  7 mL Mouth Rinse BID  . atorvastatin  40 mg Oral Daily  . budesonide (PULMICORT) nebulizer solution  0.25 mg Nebulization BID  . carvedilol  25 mg Oral BID WC  . chlorhexidine  15 mL Mouth Rinse BID  . clopidogrel  75 mg Oral Q breakfast  . enoxaparin (LOVENOX) injection  40 mg Subcutaneous Q24H  . gabapentin  300 mg Oral BID  . insulin aspart  0-15 Units Subcutaneous TID WC  . insulin aspart protamine- aspart  15 Units Subcutaneous BID WC  . ipratropium-albuterol  3 mL Nebulization QID  . latanoprost  1 drop Both Eyes QHS  . levofloxacin (LEVAQUIN) IV  250 mg Intravenous Q24H  . levothyroxine  50 mcg Oral QAC breakfast  . methylPREDNISolone (SOLU-MEDROL) injection  60 mg Intravenous Q12H  . sodium chloride  3 mL Intravenous Q12H   Time spent on care of this patient: 35 mins  Cannie Muckle T , MD   [facility] Office  [phone] Pager - Text Page per Loretha Stapler as per below:  On-Call/Text Page:      [email or web address]      password [email or web address]  If 7PM-7AM, please contact night-coverage [email or web address] Password [email or web address] 06/14/2014, 1:37 PM   LOS: 2 days

## 2014-06-14 NOTE — Progress Notes (Signed)
UR Completed.  Wendie Diskin Jane [phone] 06/14/2014  

## 2014-06-14 NOTE — Progress Notes (Signed)
Patient had 9 beat run vtach, pt. Asymptomatic, vitals WDL, will continue to monitor.  MD made aware

## 2014-06-14 NOTE — Progress Notes (Signed)
eLink Physician-Brief Progress Note Patient Name: [name] FullingBetty J [name] DOB: 1941/02/14 MRN: [id number]  Date of Service  06/14/2014   HPI/Events of Note   Routine review of abx in setting of positive cultures. 1/2 coag negative staph. Primary team believes cultures are 2/2 contamination.    eICU Interventions   No intervention.    Intervention Category Intermediate Interventions: OtherCurt Bears:  Sara Keys R. 06/14/2014, 4:29 PM

## 2014-06-14 NOTE — Progress Notes (Signed)
Bipap left in room in case pt needs assistance. Sats are 100% at this time. RT will continue to monitor.

## 2014-06-14 NOTE — Evaluation (Signed)
Physical Therapy Evaluation Patient Details Name: [name] [name] MRN: [id number] DOB: [birth year] Today's Date: 06/14/2014   History of Present Illness  73 yo F with prior h/o hypertension, DM, CVA, and stage 2 CKD, who was brought in by EMS for altered mental status, found to be acidotic upon admission.  Clinical Impression  Patient demonstrates deficits in mobility as indicated below. Patient will need continued skilled PT to address deficits and maximiZe function. Will see as indicated and progress as tolerated. Feel patient will benefit from ST SNF however, patient and spouse desire to go home. If patient has adequate and safe assist anticipate patient can go home with 24 hr assist and HHPT, if safe assist is not available patient will likely need ST SNF.    Follow Up Recommendations SNF;Supervision/Assistance - 24 hour (vs HHPT (spouse wants pt to go home))    Equipment Recommendations  None recommended by PT    Recommendations for Other Services       Precautions / Restrictions Precautions Precautions: Fall      Mobility  Bed Mobility               General bed mobility comments: not assessed at this time  Transfers Overall transfer level: Needs assistance Equipment used: Rolling walker (2 wheeled) Transfers: Sit to/from Stand Sit to Stand: Min assist         General transfer comment: VCs for hand placement, minimal assist for stability when coming to upright  Ambulation/Gait Ambulation/Gait assistance: Min guard Ambulation Distance (Feet): 60 Feet Assistive device: Rolling walker (2 wheeled) Gait Pattern/deviations: Step-through pattern;Decreased stride length;Trunk flexed;Drifts right/left Gait velocity: decreased Gait velocity interpretation: <1.8 ft/sec, indicative of risk for recurrent falls General Gait Details: some instability noted but no LOB   Stairs            Wheelchair Mobility    Modified Rankin (Stroke Patients Only)       Balance                                              Pertinent Vitals/Pain Pain Assessment: No/denies pain    Home Living Family/patient expects to be discharged to:: Private residence Living Arrangements: Spouse/significant other Available Help at Discharge: Family;Available 24 hours/day (Husband and 2 sons) Type of Home: House Home Access: Ramped entrance     Home Layout: One level Home Equipment: Walker - 2 wheels;Shower seat;Electric scooter;Cane - single point;Bedside commode      Prior Function Level of Independence: Needs assistance   Gait / Transfers Assistance Needed: Assist to transfer and for ambulation with RW  ADL's / Homemaking Assistance Needed: Assist for bathing/dressing, housekeeping, and meal prep.   Comments: Patient legally blind     Hand Dominance   Dominant Hand: Right    Extremity/Trunk Assessment   Upper Extremity Assessment: Generalized weakness           Lower Extremity Assessment: Generalized weakness         Communication   Communication: Expressive difficulties;HOH  Cognition Arousal/Alertness: Awake/alert Behavior During Therapy: WFL for tasks assessed/performed Overall Cognitive Status: Impaired/Different from baseline Area of Impairment: Awareness;Problem solving           Awareness: Emergent Problem Solving: Slow processing;Requires verbal cues      General Comments      Exercises        Assessment/Plan  PT Assessment Patient needs continued PT services  PT Diagnosis Difficulty walking;Abnormality of gait;Generalized weakness   PT Problem List Decreased strength;Decreased activity tolerance;Decreased balance;Decreased mobility;Decreased safety awareness  PT Treatment Interventions DME instruction;Gait training;Stair training;Functional mobility training;Therapeutic activities;Therapeutic exercise;Balance training;Patient/family education   PT Goals (Current goals can be found in the Care Plan  section) Acute Rehab PT Goals Patient Stated Goal: to go home PT Goal Formulation: With patient/family Time For Goal Achievement: 06/28/14 Potential to Achieve Goals: Good    Frequency Min 4X/week   Barriers to discharge        Co-evaluation               End of Session Equipment Utilized During Treatment: Gait belt Activity Tolerance: Patient tolerated treatment well Patient left: in chair;with call bell/phone within reach;with family/visitor present Nurse Communication: Mobility status         Time: 1191-47820836-0901 PT Time Calculation (min): 25 min   Charges:   PT Evaluation $Initial PT Evaluation Tier I: 1 Procedure PT Treatments $Gait Training: 8-22 mins $Therapeutic Activity: 8-22 mins   PT G CodesFabio Asa:          Tamicka Shimon J 06/14/2014, 9:06 AM Charlotte Crumbevon Kolbie Clarkston, PT DPT  [phone]

## 2014-06-15 DIAGNOSIS — E118 Type 2 diabetes mellitus with unspecified complications: Secondary | ICD-10-CM

## 2014-06-15 DIAGNOSIS — IMO0002 Reserved for concepts with insufficient information to code with codable children: Secondary | ICD-10-CM | POA: Diagnosis present

## 2014-06-15 DIAGNOSIS — E1165 Type 2 diabetes mellitus with hyperglycemia: Secondary | ICD-10-CM | POA: Diagnosis present

## 2014-06-15 LAB — GLUCOSE, CAPILLARY
GLUCOSE-CAPILLARY: 112 mg/dL — AB (ref 70–99)
GLUCOSE-CAPILLARY: 56 mg/dL — AB (ref 70–99)
GLUCOSE-CAPILLARY: 139 mg/dL — AB (ref 70–99)
Glucose-Capillary: 125 mg/dL — ABNORMAL HIGH (ref 70–99)
Glucose-Capillary: 54 mg/dL — ABNORMAL LOW (ref 70–99)
Glucose-Capillary: 116 mg/dL — ABNORMAL HIGH (ref 70–99)

## 2014-06-15 LAB — BASIC METABOLIC PANEL
Anion gap: 11 (ref 5–15)
BUN: 35 mg/dL — AB (ref 6–23)
CHLORIDE: 109 meq/L (ref 96–112)
CO2: 25 mEq/L (ref 19–32)
CREATININE: 1.47 mg/dL — AB (ref 0.50–1.10)
Calcium: 8.4 mg/dL (ref 8.4–10.5)
GFR calc Af Amer: 39 mL/min — ABNORMAL LOW (ref >90)
GFR calc non Af Amer: 34 mL/min — ABNORMAL LOW (ref >90)
Glucose, Bld: 101 mg/dL — ABNORMAL HIGH (ref 70–99)
Potassium: 5 mEq/L (ref 3.7–5.3)
Sodium: 145 mEq/L (ref 137–147)

## 2014-06-15 MED ORDER — INSULIN ASPART PROT & ASPART (70-30 MIX) 100 UNIT/ML ~~LOC~~ SUSP
20.0000 [IU] | Freq: Once | SUBCUTANEOUS | Status: AC
  Administered 2014-06-15: 20 [IU] via SUBCUTANEOUS

## 2014-06-15 NOTE — Progress Notes (Signed)
Call placed to Dr. Alden ServerShorr- on call Hospitalist to obtain order to hold Novolog 70/30 insulin due to Hypoglycemic episode.

## 2014-06-15 NOTE — Progress Notes (Signed)
[facility] TEAM 1 - Stepdown/ICU TEAM Progress Note  [name] [name]:[id number] DOB: 1941-02-20 DOA: 06/12/2014 PCP: Alva Garnet., MD  Admit HPI / Brief Narrative: 73 yo F with prior h/o hypertension, DM, CVA, and stage 2 CKD, who was brought in by EMS for altered mental status.  As per the husband patient did not get out of bed, did not want to take medications, and was acting weird. Speech therapist came in to visit the pt and called 911.    On arrival to ED, she was found to be in respiratory acidosis with pH 7.1 and Pco2 of 75.5. She was put on BIPAP and her repeat abg showed an improvement in pH of 7.265 and PCO2 of 59.3. Initial CT head showed chronic changes. CXR showed mild interstitial prominence B suggestive of pneumonitis.   HPI/Subjective: Intermittent cough persists.  No new complaints.  Able to carry on conversation w/o dyspnea.  Denies cp, n/v, or abom pain.     Assessment/Plan:  Acute encephalopathy CT head negative for acute stroke or bleed - due to hypercapnea, likely related to COPD exacerbation, +/- a component of sleep apnea - resolved at this time / back to apparent baseline MS  Acute hypercapnic respiratory failure - previously undiagnosed COPD w/ acute exacerbation  Pt states she has quite smoking - cont nebs - wean steroids asap   Coag neg staph in 1/2 blood cx Most c/w a contaminant - follow clinically w/o change in abx tx   HTN BP has stabilized somewhat - follow w/o change today   Uncontrolled DM w/ renal complications  CBG now better controlled - follow w/o change today   CKD stage III Baseline crt is ~1.12 - crt stabilizing - follow trend   Hypokalemia Corrected  Normocytic anemia Follow trend - no evidence of bleeding  Hypothyroidism TSH at goal   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  Residual L sided weakness   Code Status: FULL Family Communication: spoke w/ POA outside room 8/8 - he reports that pt has been declining since leaving  the SNF,and requests SNF at time of d/c as he does not feel the home situation is safe - no family present at time of visit today  Disposition Plan: transfer to med bed - PT/OT - investigate possible SNF, for rehab stay or potentially long term residency   Consultants: Neurology  Procedures: EEG - 8/7 - generalized irregular slow activity  Antibiotics: levaquin 8/07 >>  DVT prophylaxis: lovenox  Objective: Blood pressure 157/59, pulse 70, temperature 98.2 F (36.8 C), temperature source Oral, resp. rate 28, height 5' (1.524 m), weight 83 kg (182 lb 15.7 oz), SpO2 93.00%.  Intake/Output Summary (Last 24 hours) at 06/15/14 0951 Last data filed at 06/15/14 0900  Gross per 24 hour  Intake   1170 ml  Output   1150 ml  Net     20 ml   Exam: General: No acute respiratory distress at rest - coughs frequently  Lungs: poor air movement th/o all fields - no active wheeze - no focal crackles  Cardiovascular: distant heart sounds - regular rate and rhythm without murmur gallop or rub   Abdomen: nontender, nondistended, soft, bowel sounds positive, no rebound, no ascites, no appreciable mass Extremities: No significant cyanosis, clubbing, or edema bilateral lower extremities  Data Reviewed: Basic Metabolic Panel:  Recent Labs Lab 06/09/14 1335 06/12/14 1134 06/12/14 1158 06/13/14 0353 06/14/14 0359 06/15/14 0815  NA 142 140 141 143 139 145  K 4.2 3.8 3.6* 4.3 4.9 5.0  CL 104 104 103 107 102 109  CO2 29 24  --  25 24 25   GLUCOSE 205* 329* 357* 113* 267* 101*  BUN 17 18 18 21  31* 35*  CREATININE 1.28* 1.06 1.20* 1.25* 1.48* 1.47*  CALCIUM 8.4 8.1*  --  8.4 8.3* 8.4   Liver Function Tests:  Recent Labs Lab 06/12/14 1134  AST 17  ALT 21  ALKPHOS 81  BILITOT 0.3  PROT 6.5  ALBUMIN 2.8*   Coags:  Recent Labs Lab 06/12/14 1134  INR 1.28   CBC:  Recent Labs Lab 06/09/14 1335 06/12/14 1134 06/12/14 1158 06/13/14 0353 06/14/14 0359  WBC 3.9* 8.8  --  4.6 8.9    NEUTROABS 2.2 8.3*  --   --   --   HGB 9.2* 9.1* 10.5* 8.8* 9.0*  HCT 28.2* 28.7* 31.0* 27.3* 27.9*  MCV 92.2 92.0  --  93.5 92.7  PLT 180 188  --  164 180   CBG:  Recent Labs Lab 06/14/14 1148 06/14/14 1710 06/14/14 2100 06/14/14 2154 06/15/14 0754  GLUCAP 243* 106* 61* 99 112*    Recent Results (from the past 240 hour(s))  CULTURE, BLOOD (ROUTINE X 2)     Status: None   Collection Time    06/12/14 11:34 AM      Result Value Ref Range Status   Specimen Description BLOOD RIGHT HAND   Final   Special Requests BOTTLES DRAWN AEROBIC AND ANAEROBIC 6CC   Final   Culture  Setup Time     Final   Value: 06/12/2014 17:30     Performed at [facility]   Culture     Final   Value:        BLOOD CULTURE RECEIVED NO GROWTH TO DATE CULTURE WILL BE HELD FOR 5 DAYS BEFORE ISSUING A FINAL NEGATIVE REPORT     Performed at [facility]   Report Status PENDING   Incomplete  CULTURE, BLOOD (ROUTINE X 2)     Status: None   Collection Time    06/12/14 11:50 AM      Result Value Ref Range Status   Specimen Description BLOOD LEFT HAND   Final   Special Requests BOTTLES DRAWN AEROBIC AND ANAEROBIC 5CC   Final   Culture  Setup Time     Final   Value: 06/12/2014 17:30     Performed at [facility]   Culture     Final   Value: STAPHYLOCOCCUS SPECIES (COAGULASE NEGATIVE)     Note: THE SIGNIFICANCE OF ISOLATING THIS ORGANISM FROM A SINGLE SET OF BLOOD CULTURES WHEN MULTIPLE SETS ARE DRAWN IS UNCERTAIN. PLEASE NOTIFY THE MICROBIOLOGY DEPARTMENT WITHIN ONE WEEK IF SPECIATION AND SENSITIVITIES ARE REQUIRED.     Note: Gram Stain Report Called to,Read Back By and Verified With: AMANDA PETTIFORD 06/13/14 @ 9:25PM BY RUSCOE A.     Performed at [facility]   Report Status 06/14/2014 FINAL   Final  MRSA PCR SCREENING     Status: None   Collection Time    06/12/14  5:24 PM      Result Value Ref Range Status   MRSA by PCR NEGATIVE  NEGATIVE Final   Comment:            The  GeneXpert MRSA Assay (FDA     approved for NASAL specimens     only), is one component of a     comprehensive  MRSA colonization     surveillance program. It is not     intended to diagnose MRSA     infection nor to guide or     monitor treatment for     MRSA infections.    Studies:  Recent x-ray studies have been reviewed in detail by the Attending Physician  Scheduled Meds:  Scheduled Meds: . antiseptic oral rinse  7 mL Mouth Rinse BID  . atorvastatin  40 mg Oral Daily  . budesonide (PULMICORT) nebulizer solution  0.25 mg Nebulization BID  . carvedilol  25 mg Oral BID WC  . chlorhexidine  15 mL Mouth Rinse BID  . clopidogrel  75 mg Oral Q breakfast  . enoxaparin (LOVENOX) injection  40 mg Subcutaneous Q24H  . gabapentin  300 mg Oral BID  . hydrALAZINE  75 mg Oral 3 times per day  . insulin aspart  0-15 Units Subcutaneous TID WC  . insulin aspart protamine- aspart  30 Units Subcutaneous BID WC  . ipratropium-albuterol  3 mL Nebulization QID  . latanoprost  1 drop Both Eyes QHS  . levofloxacin  250 mg Oral Daily  . levothyroxine  50 mcg Oral QAC breakfast  . predniSONE  40 mg Oral BID WC  . sodium chloride  3 mL Intravenous Q12H   Time spent on care of this patient: 35 mins  MCCLUNG,JEFFREY T , MD   [facility] Office  [phone] Pager - Text Page per Loretha Stapler as per below:  On-Call/Text Page:      [email or web address]      password [email or web address]  If 7PM-7AM, please contact night-coverage [email or web address] Password [email or web address] 06/15/2014, 9:51 AM   LOS: 3 days

## 2014-06-15 NOTE — Progress Notes (Signed)
Speech Language Pathology Treatment: Dysphagia  Patient Details Name: [name] [name] MRN: [id number] DOB: July 27, 1941 Today's Date: 06/15/2014 Time: 1050-1102 SLP Time Calculation (min): 12 min  Assessment / Plan / Recommendation Clinical Impression  Pt. Required moderate multimodal cues to arouse and remain alert to dysphagia intervention.  Pt. Is not appropriate to upgrade from puree evidenced by prolonged mastication and transit of regular solid texture, mild-mod lingual residue and decreased ability to remain alert  (states she does not wear dentures at home with po's).  Visual/verbal stimuli provided to clear residue. Continue Dys 1 and thin liquids with follow up for safety and ability to upgrade when appropriate.   HPI HPI: 73 y.o. female with prior h/o hypertension, DM, CVA, stage 2 CKd, was brought in by EMS for altered mental status. Pt on biPap when SLP entered room to complete BSE; pt tolerated being placed on [place] during BSE, CXR on 06/13/14 indicated 1. Improved aeration without significant residual interstitial disease.Small left pleural effusion; Minimal left basilar airspace disease likely reflects atelectasis. MRI head on 06/12/14 indicated no new changes   Pertinent Vitals Pain Assessment: No/denies pain  SLP Plan  Continue with current plan of care    Recommendations Diet recommendations: Dysphagia 1 (puree);Thin liquid Liquids provided via: Cup;Straw Medication Administration: Crushed with puree Supervision: Patient able to self feed;Full supervision/cueing for compensatory strategies;Staff to assist with self feeding Compensations: Slow rate;Small sips/bites;Check for pocketing Postural Changes and/or Swallow Maneuvers: Seated upright 90 degrees;Upright 30-60 min after meal              Oral Care Recommendations: Oral care BID Follow up Recommendations:  (TBD) Plan: Continue with current plan of care    GO     [name] CoonsLisa [name] [name] [name] M.Ed [employer]-SLP Pager  [phone]  06/15/2014

## 2014-06-15 NOTE — Progress Notes (Signed)
Physical Therapy Treatment Patient Details Name: [name] FullingBetty J Blanchet MRN: [id number] DOB: Apr 20, 1941 Today's Date: 06/15/2014    History of Present Illness 73 yo F with prior h/o hypertension, DM, CVA, and stage 2 CKD, who was brought in by EMS for altered mental status, found to be acidotic upon admission.    PT Comments    Progressing slowly with some motor planning and ataxia noted.  Will need capable 24 hour assist.  Agree with return to SNF at d/c.  Follow Up Recommendations  SNF;Supervision/Assistance - 24 hour     Equipment Recommendations  None recommended by PT    Recommendations for Other Services       Precautions / Restrictions Precautions Precautions: Fall    Mobility  Bed Mobility Overal bed mobility: Needs Assistance Bed Mobility: Supine to Sit     Supine to sit: Mod assist;HOB elevated     General bed mobility comments: increased time with multimodal cues and assist for legs off edge of bed and to scoot to edge of bed  Transfers Overall transfer level: Needs assistance Equipment used: Rolling walker (2 wheeled) Transfers: Sit to/from Stand Sit to Stand: Min assist         General transfer comment: assist for lifting off surface of bed  Ambulation/Gait Ambulation/Gait assistance: Min assist;Mod assist Ambulation Distance (Feet): 60 Feet Assistive device: Rolling walker (2 wheeled) Gait Pattern/deviations: Step-to pattern;Decreased step length - right;Ataxic;Trunk flexed Gait velocity: 1.35 min to go 10' (0.105 ft/sec)   General Gait Details: weaker left LE with decreased stride length right, decreased core stability; increased assist to turn or to back up to chair   Stairs            Wheelchair Mobility    Modified Rankin (Stroke Patients Only)       Balance Overall balance assessment: Needs assistance           Standing balance-Leahy Scale: Poor Standing balance comment: UE reliance for standing balance                     Cognition Arousal/Alertness: Awake/alert Behavior During Therapy: WFL for tasks assessed/performed Overall Cognitive Status: Impaired/Different from baseline Area of Impairment: Problem solving             Problem Solving: Slow processing;Requires verbal cues;Requires tactile cues;Difficulty sequencing      Exercises      General Comments        Pertinent Vitals/Pain Pain Assessment: No/denies pain    Home Living                      Prior Function            PT Goals (current goals can now be found in the care plan section) Progress towards PT goals: Progressing toward goals    Frequency  Min 3X/week    PT Plan Current plan remains appropriate;Frequency needs to be updated    Co-evaluation             End of Session Equipment Utilized During Treatment: Gait belt   Patient left: in chair;with family/visitor present     Time: 7829-56211305-1335 PT Time Calculation (min): 30 min  Charges:  $Gait Training: 8-22 mins $Therapeutic Activity: 8-22 mins                    G Codes:      Finlee Milo,CYNDI 06/15/2014, 1:49 PM Sheran Lawlessyndi Aronda Burford, PT [phone] 06/15/2014

## 2014-06-15 NOTE — Progress Notes (Signed)
OT Cancellation Note  Patient Details Name: [name] FullingBetty J [name] MRN: [id number] DOB: 28-Feb-1941   Cancelled Treatment:    Reason Eval/Treat Not Completed: Pt in process of transferring off unit.  Will reattempt.  Angelene GiovanniConarpe, Kaedance Magos M Jenness Stemler Hoyletononarpe, OTR/L [phone]  06/15/2014, 3:09 PM

## 2014-06-15 NOTE — Progress Notes (Signed)
Both Novolog and Novolog 70/30 Insulins held due to blood sugar of 54 (OJ with sugar and ice cream given) and 56. Eating dinner now and another OJ with sugar given.

## 2014-06-16 ENCOUNTER — Encounter (HOSPITAL_COMMUNITY): Payer: Self-pay | Admitting: General Practice

## 2014-06-16 LAB — GLUCOSE, CAPILLARY
GLUCOSE-CAPILLARY: 75 mg/dL (ref 70–99)
GLUCOSE-CAPILLARY: 85 mg/dL (ref 70–99)
Glucose-Capillary: 127 mg/dL — ABNORMAL HIGH (ref 70–99)
Glucose-Capillary: 61 mg/dL — ABNORMAL LOW (ref 70–99)
Glucose-Capillary: 63 mg/dL — ABNORMAL LOW (ref 70–99)
Glucose-Capillary: 164 mg/dL — ABNORMAL HIGH (ref 70–99)
Glucose-Capillary: 74 mg/dL (ref 70–99)

## 2014-06-16 LAB — BLOOD GAS, ARTERIAL
ACID-BASE EXCESS: 0.7 mmol/L (ref 0.0–2.0)
Bicarbonate: 25.7 mEq/L — ABNORMAL HIGH (ref 20.0–24.0)
Drawn by: 205171
FIO2: 0.21 %
O2 Saturation: 92 %
PATIENT TEMPERATURE: 98.6
PH ART: 7.351 (ref 7.350–7.450)
TCO2: 27.1 mmol/L (ref 0–100)
pCO2 arterial: 47.6 mmHg — ABNORMAL HIGH (ref 35.0–45.0)
pO2, Arterial: 68.3 mmHg — ABNORMAL LOW (ref 80.0–100.0)

## 2014-06-16 MED ORDER — INSULIN ASPART PROT & ASPART (70-30 MIX) 100 UNIT/ML ~~LOC~~ SUSP
20.0000 [IU] | Freq: Two times a day (BID) | SUBCUTANEOUS | Status: DC
  Administered 2014-06-16: 20 [IU] via SUBCUTANEOUS

## 2014-06-16 MED ORDER — PREDNISONE 20 MG PO TABS
40.0000 mg | ORAL_TABLET | Freq: Every day | ORAL | Status: DC
  Administered 2014-06-17 – 2014-06-18 (×2): 40 mg via ORAL
  Filled 2014-06-16 (×2): qty 2

## 2014-06-16 NOTE — Evaluation (Signed)
Occupational Therapy Evaluation Patient Details Name: [name] [name] MRN: [id number] DOB: [birth year] Today's Date: 06/16/2014    History of Present Illness 73 yo F with prior h/o hypertension, DM, CVA, and stage 2 CKD, who was brought in by EMS for altered mental status, found to be acidotic upon admission.   Clinical Impression   Patient admitted with above. Patient required occasional supervision PTA. Feel patient will benefit from acute OT to increase overall independence with BADLs & IADLs, increase independence with functional mobility, increase overall activity tolerance/endurance. Plan is for patient to discharge home vs SNF (patient and family want to d/c home). Recommending additional HHOT post d/c from hospital. During session, patient overall min assist with self-care and functional mobility. Patient on room air and sats remained <90%; therapist encouraged pursed lip breathing when sats at 90%.      Follow Up Recommendations  Home health OT;SNF (HHOT vs SNF, family wants patient to d/c>home)    Equipment Recommendations  None recommended by OT (patient stated she has a BSC and tub transfer bench)    Recommendations for Other Services  none at this time     Precautions / Restrictions Precautions Precautions: Fall Restrictions Weight Bearing Restrictions: No      Mobility Bed Mobility Overal bed mobility: Needs Assistance Bed Mobility: Supine to Sit;Sit to Supine     Supine to sit: Min guard Sit to supine: Mod assist   General bed mobility comments:  (Patient requires increased time)  Transfers Overall transfer level: Needs assistance Equipment used: Rolling walker (2 wheeled) Transfers: Stand Pivot Transfers Sit to Stand: Min assist         General transfer comment:  (patient requires verbal cues for safety and initiation)    Balance Overall balance assessment: Needs assistance Sitting-balance support: No upper extremity supported         Standing  balance-Leahy Scale: Poor Standing balance comment:  (BUE support during standing and functional mobility)                            ADL Overall ADL's : Needs assistance/impaired Eating/Feeding: Supervision/ safety   Grooming: Min guard   Upper Body Bathing: Min guard   Lower Body Bathing: Min guard   Upper Body Dressing : Supervision/safety;Set up   Lower Body Dressing: Minimal assistance   Toilet Transfer: Minimal assistance   Toileting- Clothing Manipulation and Hygiene: Minimal assistance   Tub/ Shower Transfer: Minimal assistance   Functional mobility during ADLs: Minimal assistance General ADL Comments: Patient functioning at an overall min assist level. PTA, patient used a RW and DME for toilet transfers & tub/shower transfers.      Vision Eye Alignment: Impaired (comment)   Ocular Range of Motion: Impaired-to be further tested in functional context               Perception Perception Perception Tested?: No   Praxis      Pertinent Vitals/Pain Pain Assessment: No/denies pain     Hand Dominance Right   Extremity/Trunk Assessment Upper Extremity Assessment Upper Extremity Assessment: Generalized weakness   Lower Extremity Assessment Lower Extremity Assessment: Generalized weakness       Communication Communication Communication: Expressive difficulties;HOH   Cognition Arousal/Alertness: Awake/alert Behavior During Therapy: WFL for tasks assessed/performed Overall Cognitive Status: Impaired/Different from baseline Area of Impairment: Awareness;Safety/judgement;Problem solving           Awareness: Emergent Problem Solving: Slow processing;Decreased initiation;Requires verbal cues  Home Living Family/patient expects to be discharged to:: Private residence Living Arrangements: Spouse/significant other;Children Available Help at Discharge: Available 24 hours/day;Family Type of Home: House Home Access: Ramped  entrance     Home Layout: One level     Bathroom Shower/Tub: Tub/shower unit;Curtain Shower/tub characteristics: Engineer, building servicesCurtain Bathroom Toilet: Standard Bathroom Accessibility: Yes How Accessible: Accessible via walker Home Equipment: Walker - 2 wheels;Tub bench;Bedside commode          Prior Functioning/Environment Level of Independence: Needs assistance  Gait / Transfers Assistance Needed: Assist to transfer and for ambulation with RW ADL's / Homemaking Assistance Needed: Assist for bathing/dressing, housekeeping, and meal prep.    Comments: Patient legally blind    OT Diagnosis: Generalized weakness;Cognitive deficits;Disturbance of vision   OT Problem List: Decreased strength;Decreased activity tolerance;Impaired balance (sitting and/or standing);Impaired vision/perception;Decreased coordination;Decreased safety awareness;Decreased cognition;Decreased knowledge of use of DME or AE   OT Treatment/Interventions: Self-care/ADL training;Therapeutic exercise;Energy conservation;DME and/or AE instruction;Therapeutic activities;Cognitive remediation/compensation;Patient/family education;Balance training    OT Goals(Current goals can be found in the care plan section) Acute Rehab OT Goals Patient Stated Goal:  ("to walk with my walker like I used to" "to cook again" ) OT Goal Formulation: With patient/family Time For Goal Achievement: 06/23/14 Potential to Achieve Goals: Good  OT Frequency: Min 2X/week   Barriers to D/C:  (none known at this time)             End of Session Equipment Utilized During Treatment: Rolling walker  Activity Tolerance: Patient limited by fatigue Patient left: in bed;with call bell/phone within reach   Time: 1510-1555 OT Time Calculation (min): 45 min Charges:  OT General Charges $OT Visit: 1 Procedure OT Evaluation $Initial OT Evaluation Tier I: 1 Procedure OT Treatments $Self Care/Home Management : 23-37 mins (35) G-Codes:    Rodney Yera,  MS, OTR/L, CLT 06/16/2014, 4:09 PM

## 2014-06-16 NOTE — Progress Notes (Signed)
Hypoglycemic Event  CBG: 63  Treatment: 15 GM carbohydrate snack  Symptoms: None  Follow-up CBG: Time:1244 CBG Result:85  Possible Reasons for Event: Inadequate meal intake  Comments/MD notified:pt CBG now 85, asymptomatic.  Will continue to monitor.     Dorice Lamashompson, Ellakate Gonsalves L  Remember to initiate Hypoglycemia Order Set & complete

## 2014-06-16 NOTE — Progress Notes (Signed)
CBG 61.  Pt asymptomatic.  Orange juice given and lunch tray just arrived.  Pt currently eating.  Will recheck CBG in 15min. Vanice Sarahhompson, Allysson Rinehimer L

## 2014-06-16 NOTE — Progress Notes (Signed)
Hypoglycemic Event  CBG: 61  Treatment: 15 GM carbohydrate snack  Symptoms: None  Follow-up CBG: Time:1229 CBG Result:63  Possible Reasons for Event: Inadequate meal intake  Comments/MD notified: still drinking orange juice.  Will recheck CBG.    Dorice Lamashompson, Janith Nielson L  Remember to initiate Hypoglycemia Order Set & complete

## 2014-06-16 NOTE — Care Management Note (Signed)
    Page 1 of 1   06/16/2014     2:47:23 PM CARE MANAGEMENT NOTE 06/16/2014  Patient:  Erin FullingVANS,Leeza J   Account Number:  [id number]  Date Initiated:  06/15/2014  Documentation initiated by:  MAYO,HENRIETTA  Subjective/Objective Assessment:   dx resp acidosis; lives with spouse, has cane, walker, scooter, BSC, shower seat    PCP  Kellie ShropshireKim Shelton     Action/Plan:   Anticipated DC Date:  06/17/2014   Anticipated DC Plan:  SKILLED NURSING FACILITY  In-house referral  Clinical Social Worker      DC Planning Services  CM consult      Choice offered to / List presented to:             Status of service:  In process, will continue to follow Medicare Important Message given?  YES (If response is "NO", the following Medicare IM given date fields will be blank) Date Medicare IM given:  06/15/2014 Medicare IM given by:  MAYO,HENRIETTA Date Additional Medicare IM given:  06/16/2014 Additional Medicare IM given by:  Ronny FlurryHEATHER Khrystal Jeanmarie  Discharge Disposition:    Per UR Regulation:  Reviewed for med. necessity/level of care/duration of stay  If discussed at Long Length of Stay Meetings, dates discussed:    Comments:  Contact:  Mylinda LatinaLessly McNeil [phone] (Son & POA)

## 2014-06-16 NOTE — Progress Notes (Signed)
[facility] PROGRESS NOTE  [name] [name] ZOX:[id number] DOB: 1941/06/25 DOA: 06/12/2014 PCP: Alva Garnet., MD  Assessment/Plan: Active Problems:   Respiratory acidosis   Encephalopathy acute   Type II or unspecified type diabetes mellitus with unspecified complication, uncontrolled  Team [facility] assumed care on 8/11   Admit HPI / Brief Narrative:  73 yo F with prior h/o hypertension, DM, CVA, and stage 2 CKD, who was brought in by EMS for altered mental status. As per the husband patient did not get out of bed, did not want to take medications, and was acting weird. Speech therapist came in to visit the pt and called 911.  On arrival to ED, she was found to be in respiratory acidosis with pH 7.1 and Pco2 of 75.5. She was put on BIPAP and her repeat abg showed an improvement in pH of 7.265 and PCO2 of 59.3. Initial CT head showed chronic changes. CXR showed mild interstitial prominence B suggestive of pneumonitis.   HPI/Subjective:  Intermittent cough persists. No new complaints. Able to carry on conversation w/o dyspnea. Denies cp, n/v, or abom pain.   Assessment/Plan:  Acute encephalopathy  CT head negative for acute stroke or bleed - due to hypercapnea, likely related to COPD exacerbation, +/- a component of sleep apnea - resolved at this time / back to apparent baseline MS  We'll repeat ABG Patient may benefit from outpatient CPAP Will check nocturnal pulse oximetry  Acute hypercapnic respiratory failure - previously undiagnosed COPD w/ acute exacerbation  Pt states she has quite smoking - cont nebs -  Started on steroids for COPD exacerbation Decrease prednisone to 40 mg a day Also currently receiving levofloxacin  Coag neg staph in 1/2 blood cx  Most c/w a contaminant - follow clinically w/o change in abx tx   HTN  BP has stabilized somewhat - follow w/o change today   Uncontrolled DM w/ renal complications  Hypoglycemic event last night - I have decreased the  70/30 insulin to 20 units twice a day   CKD stage III  Baseline crt is ~1.12 - crt stabilizing - follow trend   Hypokalemia  Corrected  Normocytic anemia  Follow trend - no evidence of bleeding  Hypothyroidism  TSH at goal  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  Residual L sided weakness   Code Status: FULL  Family Communication: spoke w/ POA outside room 8/8 - he reports that pt has been declining since leaving the SNF,and requests SNF at time of d/c as he does not feel the home situation is safe - no family present at time of visit today  Disposition Plan: transfer to med bed  Will benefit from SNF, for rehab stay or potentially long term residency  Consultants:  Neurology  Procedures:  EEG - 8/7 - generalized irregular slow activity  Antibiotics:  levaquin 8/07 >>  DVT prophylaxis:  lovenox   HPI/Subjective: Patient sitting in a chair, comfortable does not endorse any shortness of breath or chest pain  Objective: Filed Vitals:   06/15/14 2223 06/16/14 0604 06/16/14 0759 06/16/14 0851  BP: 173/68 176/55 171/89   Pulse: 76 69 72   Temp: 98.4 F (36.9 C) 98.1 F (36.7 C)    TempSrc: Oral Oral    Resp: 24 27    Height:      Weight:      SpO2: 95% 96% 99% 98%    Intake/Output Summary (Last 24 hours) at 06/16/14 1037 Last data filed at 06/16/14  9147  Gross per 24 hour  Intake    938 ml  Output    750 ml  Net    188 ml    Exam:  General: No acute respiratory distress at rest - coughs frequently  Lungs: poor air movement th/o all fields - no active wheeze - no focal crackles  Cardiovascular: distant heart sounds - regular rate and rhythm without murmur gallop or rub  Abdomen: nontender, nondistended, soft, bowel sounds positive, no rebound, no ascites, no appreciable mass  Extremities: No significant cyanosis, clubbing, or edema bilateral lower extremities       Data Reviewed: Basic Metabolic Panel:  Recent Labs Lab 06/09/14 1335 06/12/14 1134  06/12/14 1158 06/13/14 0353 06/14/14 0359 06/15/14 0815  NA 142 140 141 143 139 145  K 4.2 3.8 3.6* 4.3 4.9 5.0  CL 104 104 103 107 102 109  CO2 29 24  --  25 24 25   GLUCOSE 205* 329* 357* 113* 267* 101*  BUN 17 18 18 21  31* 35*  CREATININE 1.28* 1.06 1.20* 1.25* 1.48* 1.47*  CALCIUM 8.4 8.1*  --  8.4 8.3* 8.4    Liver Function Tests:  Recent Labs Lab 06/12/14 1134  AST 17  ALT 21  ALKPHOS 81  BILITOT 0.3  PROT 6.5  ALBUMIN 2.8*   No results found for this basename: LIPASE, AMYLASE,  in the last 168 hours No results found for this basename: AMMONIA,  in the last 168 hours  CBC:  Recent Labs Lab 06/09/14 1335 06/12/14 1134 06/12/14 1158 06/13/14 0353 06/14/14 0359  WBC 3.9* 8.8  --  4.6 8.9  NEUTROABS 2.2 8.3*  --   --   --   HGB 9.2* 9.1* 10.5* 8.8* 9.0*  HCT 28.2* 28.7* 31.0* 27.3* 27.9*  MCV 92.2 92.0  --  93.5 92.7  PLT 180 188  --  164 180    Cardiac Enzymes: No results found for this basename: CKTOTAL, CKMB, CKMBINDEX, TROPONINI,  in the last 168 hours BNP (last 3 results)  Recent Labs  06/09/14 1344 06/12/14 1330  PROBNP 1271.0* 1666.0*     CBG:  Recent Labs Lab 06/15/14 1744 06/15/14 1843 06/15/14 2311 06/16/14 0622 06/16/14 0806  GLUCAP 56* 116* 139* 75 127*    Recent Results (from the past 240 hour(s))  CULTURE, BLOOD (ROUTINE X 2)     Status: None   Collection Time    06/12/14 11:34 AM      Result Value Ref Range Status   Specimen Description BLOOD RIGHT HAND   Final   Special Requests BOTTLES DRAWN AEROBIC AND ANAEROBIC [facility]   Final   Culture  Setup Time     Final   Value: 06/12/2014 17:30     Performed at [facility]   Culture     Final   Value:        BLOOD CULTURE RECEIVED NO GROWTH TO DATE CULTURE WILL BE HELD FOR 5 DAYS BEFORE ISSUING A FINAL NEGATIVE REPORT     Performed at [facility]   Report Status PENDING   Incomplete  CULTURE, BLOOD (ROUTINE X 2)     Status: None   Collection Time    06/12/14  11:50 AM      Result Value Ref Range Status   Specimen Description BLOOD LEFT HAND   Final   Special Requests BOTTLES DRAWN AEROBIC AND ANAEROBIC 5CC   Final   Culture  Setup Time     Final  Value: 06/12/2014 17:30     [facility]   Culture     Final   Value: STAPHYLOCOCCUS SPECIES (COAGULASE NEGATIVE)     Note: THE SIGNIFICANCE OF ISOLATING THIS ORGANISM FROM A SINGLE SET OF BLOOD CULTURES WHEN MULTIPLE SETS ARE DRAWN IS UNCERTAIN. PLEASE NOTIFY THE MICROBIOLOGY DEPARTMENT WITHIN ONE WEEK IF SPECIATION AND SENSITIVITIES ARE REQUIRED.     Note: Gram Stain Report Called to,Read Back By and Verified With: AMANDA PETTIFORD 06/13/14 @ 9:25PM BY RUSCOE A.     [facility]formed [facility]   Report Status 06/14/2014 FINAL   Final  MRSA PCR SCREENING     Status: None   Collection Time    06/12/14  5:24 PM      Result Value Ref Range Status   MRSA by PCR NEGATIVE  NEGATIVE Final   Comment:            The GeneXpert MRSA Assay (FDA     approved for NASAL specimens     only), is one component of a     comprehensive MRSA colonization     surveillance program. It is not     intended to diagnose MRSA     infection nor to guide or     monitor treatment for     MRSA infections.     Studies: Dg Chest 2 View  06/09/2014   CLINICAL DATA:  Shortness of breath and cough, assess for pneumonia.  EXAM: CHEST  2 VIEW  COMPARISON:  Chest radiograph May 01, 2014  FINDINGS: The cardiac silhouette is upper limits of normal in size, mildly calcified aortic knob. The lungs are clear without pleural effusions or focal consolidations. Stable subcentimeter new nodular density projecting over the the spine on the lateral radiograph. Trachea projects midline and there is no pneumothorax. Skin fold right hemithorax. Soft tissue planes and included osseous structures are non-suspicious.  IMPRESSION: Borderline cardiomegaly, no acute pulmonary process   Electronically Signed   By: Awilda Metroourtnay  Bloomer    On: 06/09/2014 13:21   Ct Head (brain) Wo Contrast  06/12/2014   CLINICAL DATA:  Weakness and respiratory arrest.  EXAM: CT HEAD WITHOUT CONTRAST  TECHNIQUE: Contiguous axial images were obtained from the base of the skull through the vertex without intravenous contrast.  COMPARISON:  05/01/2014  FINDINGS: There is no evidence of acute cortical infarct, intracranial hemorrhage, mass, midline shift, or extra-axial fluid collection. There is mild generalized cerebral atrophy, unchanged. Patchy and confluent periventricular white matter hypodensities do not appear significantly changed and are compatible with moderate chronic small vessel ischemic disease. Old lacunar infarcts are again seen in the basal ganglia and thalami bilaterally. Old bilateral cerebellar infarcts are also again seen.  Prior bilateral cataract extraction is noted. Mastoid air cells and paranasal sinuses are clear.  IMPRESSION: 1. No evidence of acute intracranial abnormality. 2. Unchanged chronic small vessel ischemic disease and old infarcts.   Electronically Signed   By: Sebastian AcheAllen  Grady   On: 06/12/2014 11:02   Dg Chest Port 1 View  06/13/2014   CLINICAL DATA:  Altered mental status. Previous abnormal chest x-ray.  EXAM: PORTABLE CHEST - 1 VIEW  COMPARISON:  One-view chest 06/12/2014  FINDINGS: The heart size is normal. Aeration is improved. A small left pleural effusion is suspected. Minimal left basilar airspace disease likely reflects atelectasis. Atherosclerotic changes are noted at the aorta.  IMPRESSION: 1. Improved aeration without significant residual interstitial disease. 2. Small left pleural effusion. 3.  Minimal left basilar airspace disease likely reflects atelectasis.   Electronically Signed   By: Gennette Pac M.D.   On: 06/13/2014 08:06   Dg Chest Port 1 View  06/12/2014   CLINICAL DATA:  Respiratory distress.  EXAM: PORTABLE CHEST - 1 VIEW  COMPARISON:  06/09/2014 P  FINDINGS: Mediastinum and hilar structures normal. Heart  size normal. Pulmonary vascularity normal. Mild interstitial prominence bilaterally. Mild pneumonitis cannot be excluded. No pleural effusion or pneumothorax. No acute bony abnormality.  IMPRESSION: 1. Mild interstitial prominence bilaterally. This suggests mild pneumonitis. 2. Heart size normal.   Electronically Signed   By: Maisie Fus  Register   On: 06/12/2014 12:20    Scheduled Meds: . antiseptic oral rinse  7 mL Mouth Rinse BID  . atorvastatin  40 mg Oral Daily  . budesonide (PULMICORT) nebulizer solution  0.25 mg Nebulization BID  . carvedilol  25 mg Oral BID WC  . chlorhexidine  15 mL Mouth Rinse BID  . clopidogrel  75 mg Oral Q breakfast  . enoxaparin (LOVENOX) injection  40 mg Subcutaneous Q24H  . gabapentin  300 mg Oral BID  . hydrALAZINE  75 mg Oral 3 times per day  . insulin aspart  0-15 Units Subcutaneous TID WC  . insulin aspart protamine- aspart  30 Units Subcutaneous BID WC  . latanoprost  1 drop Both Eyes QHS  . levofloxacin  250 mg Oral Daily  . levothyroxine  50 mcg Oral QAC breakfast  . predniSONE  40 mg Oral BID WC  . sodium chloride  3 mL Intravenous Q12H   Continuous Infusions: . 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 If 8PM-8AM, please contact night-coverage at [email or web address], password [facility] 06/16/2014, 10:37 AM  LOS: 4 days

## 2014-06-16 NOTE — Progress Notes (Signed)
ABG resulted.  MD notified of results. [name] [name], Masen Salvas L

## 2014-06-17 ENCOUNTER — Ambulatory Visit: Payer: Self-pay | Admitting: Neurology

## 2014-06-17 DIAGNOSIS — E1165 Type 2 diabetes mellitus with hyperglycemia: Secondary | ICD-10-CM

## 2014-06-17 DIAGNOSIS — J441 Chronic obstructive pulmonary disease with (acute) exacerbation: Secondary | ICD-10-CM

## 2014-06-17 DIAGNOSIS — E1129 Type 2 diabetes mellitus with other diabetic kidney complication: Secondary | ICD-10-CM

## 2014-06-17 DIAGNOSIS — J9602 Acute respiratory failure with hypercapnia: Secondary | ICD-10-CM

## 2014-06-17 DIAGNOSIS — J9601 Acute respiratory failure with hypoxia: Secondary | ICD-10-CM

## 2014-06-17 LAB — COMPREHENSIVE METABOLIC PANEL
ALBUMIN: 2.3 g/dL — AB (ref 3.5–5.2)
ALK PHOS: 50 U/L (ref 39–117)
ALT: 13 U/L (ref 0–35)
AST: 12 U/L (ref 0–37)
Anion gap: 8 (ref 5–15)
BUN: 30 mg/dL — AB (ref 6–23)
CHLORIDE: 112 meq/L (ref 96–112)
CO2: 27 mEq/L (ref 19–32)
Calcium: 8 mg/dL — ABNORMAL LOW (ref 8.4–10.5)
Creatinine, Ser: 1.35 mg/dL — ABNORMAL HIGH (ref 0.50–1.10)
GFR calc Af Amer: 43 mL/min — ABNORMAL LOW (ref >90)
GFR calc non Af Amer: 37 mL/min — ABNORMAL LOW (ref >90)
Glucose, Bld: 49 mg/dL — ABNORMAL LOW (ref 70–99)
POTASSIUM: 4.6 meq/L (ref 3.7–5.3)
Sodium: 147 mEq/L (ref 137–147)
Total Protein: 5.5 g/dL — ABNORMAL LOW (ref 6.0–8.3)

## 2014-06-17 LAB — CBC
HEMATOCRIT: 32.3 % — AB (ref 36.0–46.0)
Hemoglobin: 10.3 g/dL — ABNORMAL LOW (ref 12.0–15.0)
MCH: 29 pg (ref 26.0–34.0)
MCHC: 31.9 g/dL (ref 30.0–36.0)
MCV: 91 fL (ref 78.0–100.0)
PLATELETS: 239 10*3/uL (ref 150–400)
RBC: 3.55 MIL/uL — AB (ref 3.87–5.11)
RDW: 13.4 % (ref 11.5–15.5)
WBC: 8.6 10*3/uL (ref 4.0–10.5)

## 2014-06-17 LAB — GLUCOSE, CAPILLARY
GLUCOSE-CAPILLARY: 91 mg/dL (ref 70–99)
Glucose-Capillary: 36 mg/dL — CL (ref 70–99)
Glucose-Capillary: 92 mg/dL (ref 70–99)
Glucose-Capillary: 44 mg/dL — CL (ref 70–99)
Glucose-Capillary: 74 mg/dL (ref 70–99)
Glucose-Capillary: 81 mg/dL (ref 70–99)
Glucose-Capillary: 126 mg/dL — ABNORMAL HIGH (ref 70–99)
Glucose-Capillary: 298 mg/dL — ABNORMAL HIGH (ref 70–99)
Glucose-Capillary: 301 mg/dL — ABNORMAL HIGH (ref 70–99)

## 2014-06-17 MED ORDER — INSULIN ASPART 100 UNIT/ML ~~LOC~~ SOLN
0.0000 [IU] | Freq: Three times a day (TID) | SUBCUTANEOUS | Status: DC
  Administered 2014-06-17: 1 [IU] via SUBCUTANEOUS
  Administered 2014-06-17 – 2014-06-18 (×2): 5 [IU] via SUBCUTANEOUS
  Administered 2014-06-18: 1 [IU] via SUBCUTANEOUS
  Administered 2014-06-18: 3 [IU] via SUBCUTANEOUS
  Administered 2014-06-19 (×2): 1 [IU] via SUBCUTANEOUS

## 2014-06-17 MED ORDER — INSULIN ASPART PROT & ASPART (70-30 MIX) 100 UNIT/ML ~~LOC~~ SUSP
10.0000 [IU] | Freq: Two times a day (BID) | SUBCUTANEOUS | Status: DC
  Administered 2014-06-17 – 2014-06-19 (×4): 10 [IU] via SUBCUTANEOUS
  Filled 2014-06-17 (×2): qty 10

## 2014-06-17 MED ORDER — MOMETASONE FURO-FORMOTEROL FUM 100-5 MCG/ACT IN AERO
2.0000 | INHALATION_SPRAY | Freq: Two times a day (BID) | RESPIRATORY_TRACT | Status: DC
  Administered 2014-06-19: 2 via RESPIRATORY_TRACT
  Filled 2014-06-17 (×2): qty 8.8

## 2014-06-17 MED ORDER — IPRATROPIUM-ALBUTEROL 0.5-2.5 (3) MG/3ML IN SOLN
3.0000 mL | Freq: Three times a day (TID) | RESPIRATORY_TRACT | Status: DC
  Administered 2014-06-17 – 2014-06-18 (×3): 3 mL via RESPIRATORY_TRACT
  Filled 2014-06-17 (×3): qty 3

## 2014-06-17 MED ORDER — INSULIN ASPART PROT & ASPART (70-30 MIX) 100 UNIT/ML ~~LOC~~ SUSP: 10.0000 [IU] | Freq: Two times a day (BID) | SUBCUTANEOUS | Status: DC

## 2014-06-17 MED ORDER — DEXTROSE 50 % IV SOLN
25.0000 mL | Freq: Once | INTRAVENOUS | Status: AC | PRN
  Administered 2014-06-17: 50 mL via INTRAVENOUS

## 2014-06-17 MED ORDER — DEXTROSE 50 % IV SOLN
INTRAVENOUS | Status: AC
  Administered 2014-06-17: 25 mL
  Filled 2014-06-17: qty 50

## 2014-06-17 NOTE — Progress Notes (Signed)
Speech Language Pathology Treatment: Dysphagia  Patient Details Name: [name] [name] MRN: [id number] DOB: Oct 29, 1941 Today's Date: 06/17/2014 Time: [id number] SLP Time Calculation (min): 9 min  Assessment / Plan / Recommendation Clinical Impression  Clinical intervention for possible diet texture upgrade.  Mildly decreased manipulation with solid texture; no indications pharyngeal dysphagia during session.  Pt. Is more alert and improved cognitively and reports not wearing dentures during meals.  Recommend upgrade to Dys 3 texture and thin liquids with follow up x 1.   HPI HPI: 73 y.o. female with prior h/o hypertension, DM, CVA, stage 2 CKd, was brought in by EMS for altered mental status. Pt on biPap when SLP entered room to complete BSE; pt tolerated being placed on [place] during BSE, CXR on 06/13/14 indicated 1. Improved aeration without significant residual interstitial disease.Small left pleural effusion; Minimal left basilar airspace disease likely reflects atelectasis. MRI head on 06/12/14 indicated no new changes   Pertinent Vitals Pain Assessment: No/denies pain  SLP Plan  Continue with current plan of care    Recommendations Diet recommendations: Thin liquid;Dysphagia 3 (mechanical soft) Liquids provided via: Cup;Straw Medication Administration: Whole meds with puree Supervision: Patient able to self feed;Intermittent supervision to cue for compensatory strategies Compensations: Slow rate;Small sips/bites;Check for pocketing Postural Changes and/or Swallow Maneuvers: Seated upright 90 degrees;Upright 30-60 min after meal              Oral Care Recommendations: Oral care BID Plan: Continue with current plan of care    GO     [name] CoonsLisa Willis Maddyn [name] M.Ed [employer] Pager [phone]  06/17/2014

## 2014-06-17 NOTE — Progress Notes (Signed)
0630 CBG was 91. Will continue to monitor.

## 2014-06-17 NOTE — Progress Notes (Addendum)
[facility] PROGRESS NOTE  [name] [name]:[id number] DOB: 05-29-1941 DOA: 06/12/2014 PCP: Alva Garnet., MD  Assessment/Plan  Acute encephalopathy likely secondary to hypercapneic respiratory failure from COPD exac, resolving and almost to baseline according to her husband. -  CT head negative for acute stroke or bleed -  PH on initial ABG was 7.1 with pCO2 of 75. -  Recommend outpatient pulm follow up  -  Consider sleep study -  Minimize sedating medications  Acute hypoxic and hypercapnic respiratory failure - previously undiagnosed COPD w/ acute exacerbation  -  Weaned to room air.  Keep O2 sat > 88% -  Pt states she has quite smoking -  Change budesonide to dulera in anticipation of discharge  -  Start duonebs TID for now -  Continue steroid steroid taper, continue prednisone to 40 mg a day tomorrow -  Continue levofloxacin day 6 of 7  Coag neg staph in 1/2 blood cx  Most c/w a contaminant - follow clinically w/o change in abx tx   HTN, elevated previously, but most recent check was 128/47.  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  Denies SOB with ambulation off of oxygen.  States she ate most of lunch.  Voiding and having BMs.  States thinking feels clear.    Objective: Filed Vitals:   06/16/14 2059 06/17/14 0517 06/17/14 0900 06/17/14 1229  BP:  161/61  128/47  Pulse:  61  64  Temp:  98.1 F (36.7 C)  97.6 F (36.4 C)  TempSrc:  Oral  Oral  Resp:  20  21  Height:      Weight:      SpO2: 100% 100% 100% 100%    Intake/Output Summary (Last 24 hours) at 06/17/14 1524 Last data filed at 06/17/14 1450  Gross per 24 hour  Intake    940 ml  Output      0 ml  Net    940 ml   Filed Weights   06/12/14 1729 06/13/14 0648 06/14/14 0406  Weight: 78.189 kg (172 lb 6 oz) 81.647 kg (180 lb) 83 kg (182 lb 15.7 oz)    Exam:   General:  Obese BF, No acute distress  HEENT:  NCAT, MMM, has a little difficulty tracking/appearing engaged with eyes but clearly following conversation and good Malkie Wille term memory.  Cardiovascular:  RRR, nl S1, S2 no mrg, 2+ pulses, warm extremities  Respiratory:  Diminished bilateral BS without wheezes, rales, or rhonchi, no increased WOB  Abdomen:   NABS, soft,  NT/ND  MSK:   Normal tone and bulk, trace bilateral pitting LEE  Neuro:  Deviates to the left when ambulating, but able to bear weight on both legs and hold walker with both arms.   Psych:  A&O to person, aug 6th (currently 12th) and [place].     Data Reviewed: Basic Metabolic Panel:  Recent Labs Lab 06/12/14 1134 06/12/14 1158 06/13/14 0353 06/14/14 0359 06/15/14 0815 06/17/14 0439  NA 140 141 143 139 145 147  K 3.8 3.6* 4.3 4.9 5.0 4.6  CL 104 103 107 102 109 112  CO2 24  --  25 24 25 27   GLUCOSE 329* 357* 113* 267* 101* 49*  BUN 18 18 21  31* 35* 30*  CREATININE 1.06 1.20* 1.25* 1.48* 1.47* 1.35*  CALCIUM 8.1*  --  8.4 8.3* 8.4 8.0*   Liver Function Tests:  Recent Labs Lab 06/12/14 1134  06/17/14 0439  AST 17 12  ALT 21 13  ALKPHOS 81 50  BILITOT 0.3 <0.2*  PROT 6.5 5.5*  ALBUMIN 2.8* 2.3*   No results found for this basename: LIPASE, AMYLASE,  in the last 168 hours No results found for this basename: AMMONIA,  in the last 168 hours CBC:  Recent Labs Lab 06/12/14 1134 06/12/14 1158 06/13/14 0353 06/14/14 0359 06/17/14 0439  WBC 8.8  --  4.6 8.9 8.6  NEUTROABS 8.3*  --   --   --   --   HGB 9.1* 10.5* 8.8* 9.0* 10.3*  HCT 28.7* 31.0* 27.3* 27.9* 32.3*  MCV 92.0  --  93.5 92.7 91.0  PLT 188  --  164 180 239   Cardiac Enzymes: No results found for this basename: CKTOTAL, CKMB, CKMBINDEX, TROPONINI,  in the last 168 hours BNP (last 3 results)  Recent Labs  06/09/14 1344 06/12/14 1330  PROBNP 1271.0* 1666.0*   CBG:  Recent Labs Lab 06/17/14 0508 06/17/14 0524 06/17/14 0631 06/17/14 0735 06/17/14 1142  GLUCAP 44* 74 91 81 126*    Recent Results (from the past 240 hour(s))  CULTURE, BLOOD (ROUTINE X 2)     Status: None   Collection Time    06/12/14 11:34 AM      Result Value Ref Range Status   Specimen Description BLOOD RIGHT HAND   Final   Special Requests BOTTLES DRAWN AEROBIC AND ANAEROBIC 6CC   Final   Culture  Setup Time     Final   Value: 06/12/2014 17:30     Performed at [facility]   Culture     Final   Value:        BLOOD CULTURE RECEIVED NO GROWTH TO DATE CULTURE WILL BE HELD FOR 5 DAYS BEFORE ISSUING A FINAL NEGATIVE REPORT     Performed at [facility]   Report Status PENDING   Incomplete  CULTURE, BLOOD (ROUTINE X 2)     Status: None   Collection Time    06/12/14 11:50 AM      Result Value Ref Range Status   Specimen Description BLOOD LEFT HAND   Final   Special Requests BOTTLES DRAWN AEROBIC AND ANAEROBIC 5CC   Final   Culture  Setup Time     Final   Value: 06/12/2014 17:30     Performed at [facility]   Culture     Final   Value: STAPHYLOCOCCUS SPECIES (COAGULASE NEGATIVE)     Note: THE  SIGNIFICANCE OF ISOLATING THIS ORGANISM FROM A SINGLE SET OF BLOOD CULTURES  WHEN MULTIPLE SETS ARE DRAWN IS UNCERTAIN. PLEASE NOTIFY THE MICROBIOLOGY DEPARTMENT WITHIN ONE WEEK IF SPECIATION AND SENSITIVITIES ARE REQUIRED.     Note: Gram Stain Report Called to,Read Back By and Verified With: AMANDA PETTIFORD 06/13/14 @ 9:25PM BY RUSCOE A.     [facility]   Report Status 06/14/2014 FINAL   Final  MRSA PCR SCREENING     Status: None   Collection Time    06/12/14  5:24 PM      Result Value Ref Range Status   MRSA by PCR NEGATIVE  NEGATIVE Final   Comment:            The GeneXpert MRSA Assay (FDA     approved for NASAL specimens     only), is one component of a     comprehensive MRSA colonization     surveillance program. It is not     intended to diagnose MRSA     infection nor to guide or     monitor treatment for     MRSA infections.     Studies: No results found.  Scheduled Meds: . antiseptic oral rinse  7 mL Mouth Rinse BID  . atorvastatin  40 mg Oral Daily  . budesonide (PULMICORT) nebulizer solution  0.25 mg Nebulization BID  . carvedilol  25 mg Oral BID WC  . chlorhexidine  15 mL Mouth Rinse BID  . clopidogrel  75 mg Oral Q breakfast  . enoxaparin (LOVENOX) injection  40 mg Subcutaneous Q24H  . gabapentin  300 mg Oral BID  . hydrALAZINE  75 mg Oral 3 times per day  . insulin aspart  0-9 Units Subcutaneous TID WC  . latanoprost  1 drop Both Eyes QHS  . levofloxacin  250 mg Oral Daily  . levothyroxine  50 mcg Oral QAC breakfast  . predniSONE  40 mg Oral Q breakfast  . sodium chloride  3 mL Intravenous Q12H   Continuous Infusions: . sodium chloride 10 mL/hr at 06/15/14 1648    Principal Problem:   Acute respiratory failure with hypoxia and hypercapnia Active Problems:   Hypertension   CKD (chronic kidney disease), stage III   Dysphagia, unspecified(787.20)   Respiratory acidosis   Encephalopathy acute   Type II or unspecified type diabetes  mellitus with unspecified complication, uncontrolled   COPD with acute exacerbation    Time spent: 30 min    Loriel Diehl, [facility]  [facility] Pager [phone]. If 7PM-7AM, please contact night-coverage at [email or web address], password [email or web address] 06/17/2014, 3:24 PM  LOS: 5 days

## 2014-06-17 NOTE — Progress Notes (Signed)
Physical Therapy Treatment Patient Details Name: [name] [name] MRN: [id number] DOB: Mar 26, 1941 Today's Date: 06/17/2014    History of Present Illness 73 yo F with prior h/o hypertension, DM, CVA, and stage 2 CKD, who was brought in by EMS for altered mental status, found to be acidotic upon admission.    PT Comments    Pt very slow but progressing gt. Continues to require min (A) to manage RW primarily around obstacles. Pt maintained O2 >90% while ambulating on RA. MD present and notified of O2 levels. Continue to recommend SNF for post acute rehab due to balance and mobility deficits.   Follow Up Recommendations  SNF;Supervision/Assistance - 24 hour     Equipment Recommendations  None recommended by PT    Recommendations for Other Services       Precautions / Restrictions Precautions Precautions: Fall Restrictions Weight Bearing Restrictions: No    Mobility  Bed Mobility Overal bed mobility: Needs Assistance Bed Mobility: Supine to Sit;Sit to Supine     Supine to sit: Supervision;HOB elevated Sit to supine: Min assist   General bed mobility comments: cues for hand placement and sequencing; was able to come supine to sit without physical (A); required min (A) to advance LEs into bed due to generalized weakness   Transfers Overall transfer level: Needs assistance Equipment used: Rolling walker (2 wheeled) Transfers: Sit to/from Stand Sit to Stand: Min guard Stand pivot transfers: Min guard (Patient with poor safety awareness using RW)       General transfer comment: min guard to steady; cues for hand placment and sequencing with RW; pt unsteady initially but with min guard and tactile cues able to regain balance and steady with RW  Ambulation/Gait Ambulation/Gait assistance: Min assist Ambulation Distance (Feet): 70 Feet Assistive device: Rolling walker (2 wheeled) Gait Pattern/deviations: Step-through pattern;Decreased stride length;Wide base of support;Drifts  right/left Gait velocity: decreased Gait velocity interpretation: Below normal speed for age/gender General Gait Details: pt with difficulty managing RW throughout session due to decr vision; required (A) to manage RW; cues for sequencing and upright posture    Stairs            Wheelchair Mobility    Modified Rankin (Stroke Patients Only)       Balance Overall balance assessment: Needs assistance Sitting-balance support: Feet supported;No upper extremity supported Sitting balance-Leahy Scale: Good Sitting balance - Comments: tolerated sitting EOB 10 min; able to reach out of BOS but demo visual peripheral deficits    Standing balance support: During functional activity;Bilateral upper extremity supported Standing balance-Leahy Scale: Poor Standing balance comment: relies heavily on RW for UE support and balance                     Cognition Arousal/Alertness: Awake/alert Behavior During Therapy: Flat affect Overall Cognitive Status: Impaired/Different from baseline Area of Impairment: Problem solving;Awareness           Awareness: Emergent Problem Solving: Slow processing;Decreased initiation;Requires verbal cues;Requires tactile cues General Comments: pt with flat affect but answers questions appropriately with incr time     Exercises General Exercises - Lower Extremity Ankle Circles/Pumps: AROM;Both;10 reps;Seated Long Arc Quad: AROM;Strengthening;Both;10 reps;Seated    General Comments        Pertinent Vitals/Pain Pain Assessment: 0-10 Pain Score: 3  Pain Location: throat Pain Descriptors / Indicators: Sore Pain Intervention(s): Patient requesting pain meds-RN notified    Home Living  Prior Function            PT Goals (current goals can now be found in the care plan section) Acute Rehab PT Goals Patient Stated Goal: to get some ice cream PT Goal Formulation: With patient/family Time For Goal Achievement:  06/28/14 Potential to Achieve Goals: Good Progress towards PT goals: Progressing toward goals    Frequency  Min 3X/week    PT Plan Current plan remains appropriate    Co-evaluation             End of Session Equipment Utilized During Treatment: Gait belt Activity Tolerance: Patient tolerated treatment well Patient left: in bed;with call bell/phone within reach;with bed alarm set;with family/visitor present     Time: 8119-1478 PT Time Calculation (min): 33 min  Charges:  Automatic Data Training: 23-37 mins                    G CodesDonell Sievert, [place] [phone] 06/17/2014, 4:11 PM

## 2014-06-17 NOTE — Progress Notes (Signed)
Hypoglycemic Event  CBG:36 0255  Treatment: D50 IV 25 mL  Symptoms: None  Follow-up CBG: Time:0311 CBG Result:92  Possible Reasons for Event: Inadequate meal intake  Comments/MD notified:Patient was easy to arouse but drowsy so 25ml of D50 given. Patient was more alert after D50 given. Will continue to monitor    Donivan ScullShelton, Jaydah Stahle A  Remember to initiate Hypoglycemia Order Set & complete

## 2014-06-17 NOTE — Progress Notes (Signed)
Hypoglycemic Event  CBG:0505 44  Treatment: D50 IV 25 mL  Symptoms: None  Follow-up CBG: Time:0525 CBG Result:74  Possible Reasons for Event: Inadequate meal intake  Comments/MD notified:Follow hypoglycemic protocol. Patient is more alert. Patient was given apple juice and ensure pudding for cbg of 74. Will continue to monitor.    [name] ScullShelton, [name] [name]  Remember to initiate Hypoglycemia Order Set & complete

## 2014-06-17 NOTE — Progress Notes (Signed)
Occupational Therapy Treatment Patient Details Name: [name] [name] MRN: [id number] DOB: 1941/06/21 Today's Date: 06/17/2014    History of present illness 73 yo F with prior h/o hypertension, DM, CVA, and stage 2 CKD, who was brought in by EMS for altered mental status, found to be acidotic upon admission.   OT comments  Patient received seated in recliner with husband present in room. Prior to entering room, therapist discussed d/c plan with patient's RN. According to RN, staff questioning if husband is able to provide needed assistance at home for patient. This therapist discussed d/c plans and both agreed, SNF would be best option at this time, changed d/c recommendations > SNF. During session, focused skilled intervention on sit<>stands for BLE strengthening & overall endurance in order to increase independence with self-care tasks and functional mobility. Also focused on LB dressing, functional transfer (recliner>EOB), sit>supine, and overall activity tolerance/endurance. Patient with poor RW safety awareness and required min guard to correct LOB with RW during functional steps. Patient transferred sit>supine with min guard assist and left supine in bed with all needs within reach and husband present. Patient on 3 liters of 02 via [place] and sats remained <90% entire session.   Follow Up Recommendations  SNF    Equipment Recommendations  None recommended by OT    Recommendations for Other Services  n/a at this time.    Precautions / Restrictions Precautions Precautions: Fall Restrictions Weight Bearing Restrictions: No       Mobility Bed Mobility Overal bed mobility: Needs Assistance Bed Mobility: Sit to Supine       Sit to supine: Min guard      Transfers Overall transfer level: Needs assistance Equipment used: Rolling walker (2 wheeled) Transfers: Stand Pivot Transfers Sit to Stand: Min guard (patient requires direct cues for body mechanics) Stand pivot transfers: Min  guard (Patient with poor safety awareness using RW)            Balance Overall balance assessment: Needs assistance         Standing balance support: Bilateral upper extremity supported Standing balance-Leahy Scale: Poor                     ADL Overall ADL's : Needs assistance/impaired                                              Vision Eye Alignment: Impaired (comment)   Ocular Range of Motion: Impaired-to be further tested in functional context               Perception     Praxis      Cognition   Behavior During Therapy: [facility] for tasks assessed/performed Overall Cognitive Status: Impaired/Different from baseline Area of Impairment: Awareness;Safety/judgement;Problem solving                                 Pertinent Vitals/ Pain       Pain Assessment: No/denies pain      Progress Toward Goals  OT Goals(current goals can now be found in the care plan section)  Progress towards OT goals: Progressing toward goals     Plan Discharge plan needs to be updated       End of Session Equipment Utilized During Treatment: Rolling walker   Activity Tolerance Patient  tolerated treatment well;Patient limited by fatigue   Patient Left in bed;with call bell/phone within reach;with family/visitor present     Time: 1345-1403 OT Time Calculation (min): 18 min  Charges: OT General Charges $OT Visit: 1 Procedure OT Treatments $Self Care/Home Management : 8-22 mins (8) $Therapeutic Exercise: 8-22 mins (10)  Yaminah Clayborn, MS, OTRL, CLT 06/17/2014, 2:15 PM

## 2014-06-18 DIAGNOSIS — J9600 Acute respiratory failure, unspecified whether with hypoxia or hypercapnia: Principal | ICD-10-CM

## 2014-06-18 DIAGNOSIS — R339 Retention of urine, unspecified: Secondary | ICD-10-CM

## 2014-06-18 LAB — URINALYSIS, ROUTINE W REFLEX MICROSCOPIC
BILIRUBIN URINE: NEGATIVE
GLUCOSE, UA: NEGATIVE mg/dL
Hgb urine dipstick: NEGATIVE
KETONES UR: NEGATIVE mg/dL
LEUKOCYTES UA: NEGATIVE
Nitrite: NEGATIVE
PH: 5 (ref 5.0–8.0)
Protein, ur: NEGATIVE mg/dL
Specific Gravity, Urine: 1.017 (ref 1.005–1.030)
Urobilinogen, UA: 0.2 mg/dL (ref 0.0–1.0)

## 2014-06-18 LAB — GLUCOSE, CAPILLARY
GLUCOSE-CAPILLARY: 255 mg/dL — AB (ref 70–99)
Glucose-Capillary: 217 mg/dL — ABNORMAL HIGH (ref 70–99)
Glucose-Capillary: 132 mg/dL — ABNORMAL HIGH (ref 70–99)
Glucose-Capillary: 144 mg/dL — ABNORMAL HIGH (ref 70–99)

## 2014-06-18 LAB — CULTURE, BLOOD (ROUTINE X 2): Culture: NO GROWTH

## 2014-06-18 MED ORDER — NYSTATIN 100000 UNIT/ML MT SUSP
5.0000 mL | Freq: Four times a day (QID) | OROMUCOSAL | Status: DC
  Administered 2014-06-18 – 2014-06-19 (×3): 500000 [IU] via ORAL
  Filled 2014-06-18 (×6): qty 5

## 2014-06-18 MED ORDER — ACETAMINOPHEN 500 MG PO TABS: 1000.0000 mg | ORAL_TABLET | Freq: Three times a day (TID) | ORAL | Status: DC | PRN

## 2014-06-18 MED ORDER — ALBUTEROL SULFATE (2.5 MG/3ML) 0.083% IN NEBU
2.5000 mg | INHALATION_SOLUTION | Freq: Three times a day (TID) | RESPIRATORY_TRACT | Status: DC
  Administered 2014-06-18 – 2014-06-19 (×3): 2.5 mg via RESPIRATORY_TRACT
  Filled 2014-06-18 (×3): qty 3

## 2014-06-18 MED ORDER — BUDESONIDE-FORMOTEROL FUMARATE 80-4.5 MCG/ACT IN AERO
2.0000 | INHALATION_SPRAY | Freq: Two times a day (BID) | RESPIRATORY_TRACT | Status: DC
Start: 2014-06-18 — End: 2014-08-06

## 2014-06-18 MED ORDER — INSULIN NPH ISOPHANE & REGULAR (70-30) 100 UNIT/ML ~~LOC~~ SUSP: 10.0000 [IU] | Freq: Two times a day (BID) | SUBCUTANEOUS | Status: DC

## 2014-06-18 MED ORDER — IPRATROPIUM-ALBUTEROL 0.5-2.5 (3) MG/3ML IN SOLN: 3.0000 mL | Freq: Three times a day (TID) | RESPIRATORY_TRACT | Status: DC

## 2014-06-18 MED ORDER — PREDNISONE 20 MG PO TABS
20.0000 mg | ORAL_TABLET | Freq: Every day | ORAL | Status: DC
  Administered 2014-06-19: 20 mg via ORAL
  Filled 2014-06-18: qty 1

## 2014-06-18 MED ORDER — ACETAMINOPHEN 500 MG PO TABS
1000.0000 mg | ORAL_TABLET | Freq: Three times a day (TID) | ORAL | Status: DC | PRN
  Administered 2014-06-18: 1000 mg via ORAL
  Filled 2014-06-18: qty 2

## 2014-06-18 MED ORDER — METOPROLOL TARTRATE 50 MG PO TABS
50.0000 mg | ORAL_TABLET | Freq: Two times a day (BID) | ORAL | Status: DC
Start: 2014-06-18 — End: 2014-07-27

## 2014-06-18 MED ORDER — PREDNISONE 10 MG PO TABS: ORAL_TABLET | ORAL | Status: DC

## 2014-06-18 MED ORDER — GLYCERIN (LAXATIVE) 2.1 G RE SUPP
1.0000 | Freq: Once | RECTAL | Status: AC
  Administered 2014-06-18: 1 via RECTAL
  Filled 2014-06-18 (×2): qty 1

## 2014-06-18 NOTE — Care Management Note (Signed)
[facility]  Resumption of care orders requested upon discharge.

## 2014-06-18 NOTE — Progress Notes (Signed)
[facility] PROGRESS NOTE  META KROENKE ZOX:[id number] DOB: 18-Oct-1941 DOA: 06/12/2014 PCP: Alva Garnet., MD  Assessment/Plan  Acute encephalopathy likely secondary to hypercapneic respiratory failure from COPD exac, resolving and almost to baseline according to her husband. -  CT head negative for acute stroke or bleed -  PH on initial ABG was 7.1 with pCO2 of 75. -  Recommend outpatient pulm follow up  -  Consider sleep study -  Minimize sedating medications  Abdominal pain with urinary retention -  PVR:  -  D/c ipratropium -  UA pending -  I/O cath for now until ipratropium out of system  Acute hypoxic and hypercapnic respiratory failure - previously undiagnosed COPD w/ acute exacerbation  -  Weaned to room air.  Keep O2 sat > 88% -  Pt states she has quite smoking -  Cont dulera and albuterol TID -  Continue steroid steroid taper, change to prednisone to 20 mg a day tomorrow -  Continue levofloxacin day 7 of 7  Coag neg staph in 1/2 blood cx  Most c/w a contaminant - follow clinically w/o change in abx tx   HTN, elevated previously, but most recent check was 128/47.  May have been elevated in part due to steroids - continue hydralazine - trend BP as steroids tapered  Uncontrolled DM w/ renal complications with recurrent hypoglycemia despite reduced insulin yesterday -  Resume 70/30 at 10units BID -  Change to low dose SSI  -  Continue hypoglycemia protocol prn  CKD stage III, Baseline crt is ~1.2-1.6, at baseline  Hypokalemia, resolved with supplementation  Normocytic anemia, no evidence of bleeding   Hypothyroidism, stable, TSH at goal, continue synthroid  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  -  Residual L sided weakness and possibly some mild neglect -  PT/OT recommending SNF, patient amenable -  SW consult placed  Diet:  Dysphagia 3 with thin Access:  PIV IVF:  off Proph:  lovenox  Code Status: full Family Communication: patient and  husband Disposition Plan:  To SNF tomorrow   Consultants:  Neurology  Procedures:  EEG - 8/7 - generalized irregular slow activity  Antibiotics:  levaquin 8/07 >>   HPI/Subjective:  Sleepier today.  Having some suprapubic tenderness.   Objective: Filed Vitals:   06/17/14 2057 06/18/14 0508 06/18/14 0812 06/18/14 1526  BP: 151/62 158/64    Pulse: 100 69    Temp: 98.1 F (36.7 C) 98.2 F (36.8 C)    TempSrc: Oral Oral    Resp: 22 22    Height:      Weight:      SpO2: 98% 100% 100% 100%    Intake/Output Summary (Last 24 hours) at 06/18/14 1550 Last data filed at 06/18/14 0900  Gross per 24 hour  Intake    600 ml  Output    300 ml  Net    300 ml   Filed Weights   06/12/14 1729 06/13/14 0648 06/14/14 0406  Weight: 78.189 kg (172 lb 6 oz) 81.647 kg (180 lb) 83 kg (182 lb 15.7 oz)    Exam:  General: Obese BF, No acute distress  HEENT: NCAT, MMM, has a little difficulty tracking/appearing engaged with eyes but clearly following conversation and good Georgianne Gritz term memory.  Cardiovascular: RRR, nl S1, S2 no mrg, 2+ pulses, warm extremities  Respiratory: Diminished bilateral BS without wheezes, rales, or rhonchi, no increased WOB  Abdomen: NABS, soft, ND, mild TTP in lower quadrants, no rebound  or guarding. Initially stated her belly did NOT hurt during exam, then stated it did hurt some.  MSK: Normal tone and bulk, trace bilateral pitting LEE  Psych: A&O to person, August, and .    Data Reviewed: Basic Metabolic Panel:  Recent Labs Lab 06/12/14 1134 06/12/14 1158 06/13/14 0353 06/14/14 0359 06/15/14 0815 06/17/14 0439  NA 140 141 143 139 145 147  K 3.8 3.6* 4.3 4.9 5.0 4.6  CL 104 103 107 102 109 112  CO2 24  --  25 24 25 27   GLUCOSE 329* 357* 113* 267* 101* 49*  BUN 18 18 21  31* 35* 30*  CREATININE 1.06 1.20* 1.25* 1.48* 1.47* 1.35*  CALCIUM 8.1*  --  8.4 8.3* 8.4 8.0*   Liver Function Tests:  Recent Labs Lab 06/12/14 1134 06/17/14 0439   AST 17 12  ALT 21 13  ALKPHOS 81 50  BILITOT 0.3 <0.2*  PROT 6.5 5.5*  ALBUMIN 2.8* 2.3*   No results found for this basename: LIPASE, AMYLASE,  in the last 168 hours No results found for this basename: AMMONIA,  in the last 168 hours CBC:  Recent Labs Lab 06/12/14 1134 06/12/14 1158 06/13/14 0353 06/14/14 0359 06/17/14 0439  WBC 8.8  --  4.6 8.9 8.6  NEUTROABS 8.3*  --   --   --   --   HGB 9.1* 10.5* 8.8* 9.0* 10.3*  HCT 28.7* 31.0* 27.3* 27.9* 32.3*  MCV 92.0  --  93.5 92.7 91.0  PLT 188  --  164 180 239   Cardiac Enzymes: No results found for this basename: CKTOTAL, CKMB, CKMBINDEX, TROPONINI,  in the last 168 hours BNP (last 3 results)  Recent Labs  06/09/14 1344 06/12/14 1330  PROBNP 1271.0* 1666.0*   CBG:  Recent Labs Lab 06/17/14 1142 06/17/14 1641 06/17/14 2130 06/18/14 0815 06/18/14 1154  GLUCAP 126* 298* 301* 217* 255*    Recent Results (from the past 240 hour(s))  CULTURE, BLOOD (ROUTINE X 2)     Status: None   Collection Time    06/12/14 11:34 AM      Result Value Ref Range Status   Specimen Description BLOOD RIGHT HAND   Final   Special Requests BOTTLES DRAWN AEROBIC AND ANAEROBIC [facility]   Final   Culture  Setup Time     Final   Value: 06/12/2014 17:30     Performed at [facility]   Culture     Final   Value: NO GROWTH 5 DAYS     Performed at [facility]   Report Status 06/18/2014 FINAL   Final  CULTURE, BLOOD (ROUTINE X 2)     Status: None   Collection Time    06/12/14 11:50 AM      Result Value Ref Range Status   Specimen Description BLOOD LEFT HAND   Final   Special Requests BOTTLES DRAWN AEROBIC AND ANAEROBIC 5CC   Final   Culture  Setup Time     Final   Value: 06/12/2014 17:30     Performed at [facility]   Culture     Final   Value: STAPHYLOCOCCUS SPECIES (COAGULASE NEGATIVE)     Note: THE SIGNIFICANCE OF ISOLATING THIS ORGANISM FROM A SINGLE SET OF BLOOD CULTURES WHEN MULTIPLE SETS ARE DRAWN IS  UNCERTAIN. PLEASE NOTIFY THE MICROBIOLOGY DEPARTMENT WITHIN ONE WEEK IF SPECIATION AND SENSITIVITIES ARE REQUIRED.     Note: Gram Stain Report Called to,Read Back By and Verified With: [facility]  PETTIFORD 06/13/14 @ 9:25PM BY RUSCOE A.     [facility]   Report Status 06/14/2014 FINAL   Final  MRSA PCR SCREENING     Status: None   Collection Time    06/12/14  5:24 PM      Result Value Ref Range Status   MRSA by PCR NEGATIVE  NEGATIVE Final   Comment:            The GeneXpert MRSA Assay (FDA     approved for NASAL specimens     only), is one component of a     comprehensive MRSA colonization     surveillance program. It is not     intended to diagnose MRSA     infection nor to guide or     monitor treatment for     MRSA infections.     Studies: No results found.  Scheduled Meds: . antiseptic oral rinse  7 mL Mouth Rinse BID  . atorvastatin  40 mg Oral Daily  . carvedilol  25 mg Oral BID WC  . chlorhexidine  15 mL Mouth Rinse BID  . clopidogrel  75 mg Oral Q breakfast  . enoxaparin (LOVENOX) injection  40 mg Subcutaneous Q24H  . gabapentin  300 mg Oral BID  . hydrALAZINE  75 mg Oral 3 times per day  . insulin aspart  0-9 Units Subcutaneous TID WC  . insulin aspart protamine- aspart  10 Units Subcutaneous BID WC  . ipratropium-albuterol  3 mL Nebulization TID  . latanoprost  1 drop Both Eyes QHS  . levofloxacin  250 mg Oral Daily  . levothyroxine  50 mcg Oral QAC breakfast  . mometasone-formoterol  2 puff Inhalation BID  . predniSONE  40 mg Oral Q breakfast  . sodium chloride  3 mL Intravenous Q12H   Continuous Infusions: . sodium chloride 10 mL/hr at 06/15/14 1648    Principal Problem:   Acute respiratory failure with hypoxia and hypercapnia Active Problems:   Hypertension   CKD (chronic kidney disease), stage III   Dysphagia, unspecified(787.20)   Respiratory acidosis   Encephalopathy acute   Type II or unspecified type diabetes mellitus with  unspecified complication, uncontrolled   COPD with acute exacerbation    Time spent: 30 min    Kortni Hasten, [facility]  [facility] Pager [phone]. If 7PM-7AM, please contact night-coverage at [email or web address], password [email or web address] 06/18/2014, 3:50 PM  LOS: 6 days

## 2014-06-18 NOTE — Progress Notes (Signed)
SLP Cancellation Note  Patient Details Name: [name] [name] MRN: [id number] DOB: [birth year]   Cancelled treatment:       Reason Eval/Treat Not Completed: Other (comment). Upon SLP arrival, pt was utilizing call bell to request assistance with toileting. Pt requested SLP return after she has been helped. Will return as able to assess tolerance of advanced textures.    Maxcine HamLaura Paiewonsky, M.A. CCC-SLP [phone]  Maxcine Hamaiewonsky, Moishe Schellenberg 06/18/2014, 4:22 PM

## 2014-06-18 NOTE — Discharge Summary (Signed)
Physician Discharge Summary  [name] [name] BJY:[id number] DOB: Mar 15, 1941 DOA: 06/12/2014  PCP: Alva GarnetSHELTON,KIMBERLY R., MD  Admit date: 06/12/2014 Discharge date: 06/19/2014  Recommendations for Outpatient Follow-up:  1. To SNF for ongoing PT/OT 2. Recommend outpatient sleep study to evaluate for OSA/OHS 3. Minimize sedating medications if able  4. Taper steroids 20mg  daily x 2 days, 10mg  daily x 2 days, 5 mg daily x 2 days, then stop 5. Completed her antibiotics while in the hospital 6. Daily weights and notify MD if she gains more than 3-lbs in 1 day or 5-lbs in 1 week. 7. Repeat BMP and CBC in 1 week  Discharge Diagnoses:  Principal Problem:   Acute respiratory failure with hypoxia and hypercapnia Active Problems:   Hypertension   CKD (chronic kidney disease), stage III   Dysphagia, unspecified(787.20)   Respiratory acidosis   Encephalopathy acute   Type II or unspecified type diabetes mellitus with unspecified complication, uncontrolled   COPD with acute exacerbation   Urinary retention   Discharge Condition: stable, improved  Diet recommendation: dysphagia 3 with thin  Wt Readings from Last 3 Encounters:  06/14/14 83 kg (182 lb 15.7 oz)  05/21/14 73.029 kg (161 lb)  05/03/14 72.9 kg (160 lb 11.5 oz)    History of present illness:   73 yo F with hx of hypertension, diabetes mellitus type 2 with renal manifestations, hx CVA with hemiparesis, CKD stage 3, hx of tobacco abuse and obesity, who presented with AMS.  She was unarousable at home and EMS was called.  Her initial ABG had pH of 7.1, pCO2 75.5.  She was admitted to the stepdown unit and placed on bipap and her repeat ABG showed improvement.  CT head was negative.  CXR with pneumonitis.  Neurology was consulted and EEG was completed with showed slowing but no epileptiform activity.    Hospital Course:   Acute encephalopathy secondary to acute hypoxic and hypercapneic respiratory failure from acute COPD exacerbation  (previously undiagnosed) and probable OSA/OHS.  She recovered on bipap and is almost back to her baseline according to her husband at the time of discharge.  She was started on symbicort and given a steroid burst.  She received antibiotics and nebulizer treatments.  She completed a 7-day course of levofloxacin in the hospital, but should continue prednisone 20mg  daily x 2 days, 10mg  daily x 2 days, and 5mg  daily x 2 days, first day on 8/14.  She has been removed from oxygen and has maintained oxygen saturations > 88%.  Would tolerate lower O2 saturations (88-92%) given her suspected underlying lung disease.    Coag negative staph in 1/2 blood cultures, likely a contaminant.  She clinically improved.    Hypertension, BP mildly elevated.  Continued hydralazine.  Carvedilol changed to metoprolol which is more selective given COPD.  Her BPs may be transiently elevated from her steroid burst.  Recommend checking as outpatient after steroids have been completed and if still elevated, adjust BP medications at that time.    Presumptive chronic diastolic heart failure on lasix.  Continue lasix.  Daily weights and if she gains more than 3-lbs in 1 day or 5-lbs in 1 week, please notify supervising physician.    Uncontrolled DM with renal complications and recurrent hypoglycemia.  Her 70/30 insulin was decreased to 10 units BID and her hypoglycemia resolved.  Recommend close outpatient monitoring of CBG and further adjustments of insulin as needed.  She was previously on 70/30 40 units twice daily so as she  recovers from this illness, her insulin may need to be quickly increased.    Lower abdominal pain.  PVR was initially elevated and a foley was placed.  This was attributed to her atrovent and 12 hours after her atrovent was stopped, her foley was removed and she was able to void easily.  UA negative for UTI.  She also had a BM.  Her pain has improved.    CKD stage III, Baseline crt is ~1.2-1.6, at baseline   Hypokalemia, resolved with supplementation  Normocytic anemia, no evidence of bleeding  Hypothyroidism, stable, TSH at goal, continue synthroid  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 Residual L sided weakness and possibly some mild neglect.  PT/OT recommended SNF, patient amenable.  Continued statin and plavix.   Consultants:  Neurology  Procedures:  EEG - 8/7 - generalized irregular slow activity  Antibiotics:  levaquin 8/07 >> 8/13  Discharge Exam: Filed Vitals:   06/19/14 1420  BP: 156/68  Pulse:   Temp:   Resp:    Filed Vitals:   06/19/14 0645 06/19/14 0759 06/19/14 1412 06/19/14 1420  BP: 130/78  152/56 156/68  Pulse:   72   Temp:   98.2 F (36.8 C)   TempSrc:   Oral   Resp:   20   Height:      Weight:      SpO2:  93% 95%     General: Obese BF, No acute distress  HEENT: NCAT, MMM, has a little difficulty tracking/appearing engaged with eyes but clearly following conversation and good Abayomi Pattison term memory.   Cardiovascular: RRR, nl S1, S2 no mrg, 2+ pulses, warm extremities  Respiratory: Diminished bilateral BS with faint wheezes, rales, or rhonchi, no increased WOB  Abdomen: NABS, soft, ND/NT currently MSK: Normal tone and bulk, trace bilateral pitting LEE  Psych: A&O to person, August, and [place].    Discharge Instructions      Discharge Instructions   (HEART FAILURE PATIENTS) Call MD:  Anytime you have any of the following symptoms: 1) 3 pound weight gain in 24 hours or 5 pounds in 1 week 2) shortness of breath, with or without a dry hacking cough 3) swelling in the hands, feet or stomach 4) if you have to sleep on extra pillows at night in order to breathe.    Complete by:  As directed      Call MD for:  difficulty breathing, headache or visual disturbances    Complete by:  As directed      Call MD for:  extreme fatigue    Complete by:  As directed      Call MD for:  hives    Complete by:  As directed      Call MD for:  persistant dizziness or  light-headedness    Complete by:  As directed      Call MD for:  persistant nausea and vomiting    Complete by:  As directed      Call MD for:  severe uncontrolled pain    Complete by:  As directed      Call MD for:  temperature >100.4    Complete by:  As directed      Increase activity slowly    Complete by:  As directed             Medication List    STOP taking these medications       ALEVE 220 MG tablet  Generic drug:  naproxen  sodium     carvedilol 25 MG tablet  Commonly known as:  COREG     doxycycline 100 MG tablet  Commonly known as:  VIBRA-TABS     NOVOLIN N 100 UNIT/ML injection  Generic drug:  insulin NPH Human      TAKE these medications       acetaminophen 500 MG tablet  Commonly known as:  TYLENOL  Take 2 tablets (1,000 mg total) by mouth 3 (three) times daily as needed for mild pain, moderate pain, fever or headache.     albuterol (2.5 MG/3ML) 0.083% nebulizer solution  Commonly known as:  PROVENTIL  Take 3 mLs (2.5 mg total) by nebulization every 4 (four) hours as needed for wheezing or shortness of breath.     atorvastatin 40 MG tablet  Commonly known as:  LIPITOR  Take 40 mg by mouth daily.     benzonatate 200 MG capsule  Commonly known as:  TESSALON  Take 200 mg by mouth 3 (three) times daily as needed for cough.     bimatoprost 0.01 % Soln  Commonly known as:  LUMIGAN  Place 1 drop into both eyes at bedtime.     budesonide-formoterol 80-4.5 MCG/ACT inhaler  Commonly known as:  SYMBICORT  Inhale 2 puffs into the lungs 2 (two) times daily.     clopidogrel 75 MG tablet  Commonly known as:  PLAVIX  Take 1 tablet (75 mg total) by mouth daily with breakfast.     furosemide 20 MG tablet  Commonly known as:  LASIX  Take 20 mg by mouth daily.     gabapentin 300 MG capsule  Commonly known as:  NEURONTIN  Take 1 capsule (300 mg total) by mouth 2 (two) times daily.     hydrALAZINE 25 MG tablet  Commonly known as:  APRESOLINE  Take 25 mg  by mouth 3 (three) times daily. Take with a 50 mg tablet for a 75 mg dose     hydrALAZINE 50 MG tablet  Commonly known as:  APRESOLINE  Take 50 mg by mouth 3 (three) times daily. Take with a 25 mg tablet for a 75 mg dose     insulin NPH-regular Human (70-30) 100 UNIT/ML injection  Commonly known as:  HUMULIN 70/30  Inject 10 Units into the skin 2 (two) times daily with a meal.     levothyroxine 50 MCG tablet  Commonly known as:  SYNTHROID, LEVOTHROID  Take 1 tablet (50 mcg total) by mouth daily.     metoprolol 50 MG tablet  Commonly known as:  LOPRESSOR  Take 1 tablet (50 mg total) by mouth 2 (two) times daily.     nystatin 100000 UNIT/ML suspension  Commonly known as:  MYCOSTATIN  Take 5 mLs (500,000 Units total) by mouth 4 (four) times daily.     predniSONE 10 MG tablet  Commonly known as:  DELTASONE  2 tabs daily x 2 days, 1 tab daily x 2 days, half tab daily x 2 days, then stop       Follow-up Information   Follow up with Alva Garnet., MD. Schedule an appointment as soon as possible for a visit in 2 weeks.   Specialty:  Internal Medicine   Contact information:   [address] [place] [address] [phone]       The results of significant diagnostics from this hospitalization (including imaging, microbiology, ancillary and laboratory) are listed below for reference.    Significant Diagnostic Studies: Dg Chest  2 View  06/09/2014   CLINICAL DATA:  Shortness of breath and cough, assess for pneumonia.  EXAM: CHEST  2 VIEW  COMPARISON:  Chest radiograph May 01, 2014  FINDINGS: The cardiac silhouette is upper limits of normal in size, mildly calcified aortic knob. The lungs are clear without pleural effusions or focal consolidations. Stable subcentimeter new nodular density projecting over the the spine on the lateral radiograph. Trachea projects midline and there is no pneumothorax. Skin fold right hemithorax. Soft tissue planes and included osseous  structures are non-suspicious.  IMPRESSION: Borderline cardiomegaly, no acute pulmonary process   Electronically Signed   By: Awilda Metro   On: 06/09/2014 13:21   Ct Head (brain) Wo Contrast  06/12/2014   CLINICAL DATA:  Weakness and respiratory arrest.  EXAM: CT HEAD WITHOUT CONTRAST  TECHNIQUE: Contiguous axial images were obtained from the base of the skull through the vertex without intravenous contrast.  COMPARISON:  05/01/2014  FINDINGS: There is no evidence of acute cortical infarct, intracranial hemorrhage, mass, midline shift, or extra-axial fluid collection. There is mild generalized cerebral atrophy, unchanged. Patchy and confluent periventricular white matter hypodensities do not appear significantly changed and are compatible with moderate chronic small vessel ischemic disease. Old lacunar infarcts are again seen in the basal ganglia and thalami bilaterally. Old bilateral cerebellar infarcts are also again seen.  Prior bilateral cataract extraction is noted. Mastoid air cells and paranasal sinuses are clear.  IMPRESSION: 1. No evidence of acute intracranial abnormality. 2. Unchanged chronic small vessel ischemic disease and old infarcts.   Electronically Signed   By: Sebastian Ache   On: 06/12/2014 11:02   Dg Chest Port 1 View  06/13/2014   CLINICAL DATA:  Altered mental status. Previous abnormal chest x-ray.  EXAM: PORTABLE CHEST - 1 VIEW  COMPARISON:  One-view chest 06/12/2014  FINDINGS: The heart size is normal. Aeration is improved. A small left pleural effusion is suspected. Minimal left basilar airspace disease likely reflects atelectasis. Atherosclerotic changes are noted at the aorta.  IMPRESSION: 1. Improved aeration without significant residual interstitial disease. 2. Small left pleural effusion. 3. Minimal left basilar airspace disease likely reflects atelectasis.   Electronically Signed   By: Gennette Pac M.D.   On: 06/13/2014 08:06   Dg Chest Port 1 View  06/12/2014   CLINICAL  DATA:  Respiratory distress.  EXAM: PORTABLE CHEST - 1 VIEW  COMPARISON:  06/09/2014 P  FINDINGS: Mediastinum and hilar structures normal. Heart size normal. Pulmonary vascularity normal. Mild interstitial prominence bilaterally. Mild pneumonitis cannot be excluded. No pleural effusion or pneumothorax. No acute bony abnormality.  IMPRESSION: 1. Mild interstitial prominence bilaterally. This suggests mild pneumonitis. 2. Heart size normal.   Electronically Signed   By: Maisie Fus  Register   On: 06/12/2014 12:20    Microbiology: Recent Results (from the past 240 hour(s))  CULTURE, BLOOD (ROUTINE X 2)     Status: None   Collection Time    06/12/14 11:34 AM      Result Value Ref Range Status   Specimen Description BLOOD RIGHT HAND   Final   Special Requests BOTTLES DRAWN AEROBIC AND ANAEROBIC [facility]   Final   Culture  Setup Time     Final   Value: 06/12/2014 17:30     Performed at [facility]   Culture     Final   Value: NO GROWTH 5 DAYS     Performed at [facility]   Report Status 06/18/2014 FINAL  Final  CULTURE, BLOOD (ROUTINE X 2)     Status: None   Collection Time    06/12/14 11:50 AM      Result Value Ref Range Status   Specimen Description BLOOD LEFT HAND   Final   Special Requests BOTTLES DRAWN AEROBIC AND ANAEROBIC 5CC   Final   Culture  Setup Time     Final   Value: 06/12/2014 17:30     Performed at [facility]   Culture     Final   Value: STAPHYLOCOCCUS SPECIES (COAGULASE NEGATIVE)     Note: THE SIGNIFICANCE OF ISOLATING THIS ORGANISM FROM A SINGLE SET OF BLOOD CULTURES WHEN MULTIPLE SETS ARE DRAWN IS UNCERTAIN. PLEASE NOTIFY THE MICROBIOLOGY DEPARTMENT WITHIN ONE WEEK IF SPECIATION AND SENSITIVITIES ARE REQUIRED.     Note: Gram Stain Report Called to,Read Back By and Verified With: AMANDA PETTIFORD 06/13/14 @ 9:25PM BY RUSCOE A.     Performed at [facility]   Report Status 06/14/2014 FINAL   Final  MRSA PCR SCREENING     Status: None   Collection  Time    06/12/14  5:24 PM      Result Value Ref Range Status   MRSA by PCR NEGATIVE  NEGATIVE Final   Comment:            The GeneXpert MRSA Assay (FDA     approved for NASAL specimens     only), is one component of a     comprehensive MRSA colonization     surveillance program. It is not     intended to diagnose MRSA     infection nor to guide or     monitor treatment for     MRSA infections.     Labs: Basic Metabolic Panel:  Recent Labs Lab 06/13/14 0353 06/14/14 0359 06/15/14 0815 06/17/14 0439  NA 143 139 145 147  K 4.3 4.9 5.0 4.6  CL 107 102 109 112  CO2 25 24 25 27   GLUCOSE 113* 267* 101* 49*  BUN 21 31* 35* 30*  CREATININE 1.25* 1.48* 1.47* 1.35*  CALCIUM 8.4 8.3* 8.4 8.0*   Liver Function Tests:  Recent Labs Lab 06/17/14 0439  AST 12  ALT 13  ALKPHOS 50  BILITOT <0.2*  PROT 5.5*  ALBUMIN 2.3*   No results found for this basename: LIPASE, AMYLASE,  in the last 168 hours No results found for this basename: AMMONIA,  in the last 168 hours CBC:  Recent Labs Lab 06/13/14 0353 06/14/14 0359 06/17/14 0439  WBC 4.6 8.9 8.6  HGB 8.8* 9.0* 10.3*  HCT 27.3* 27.9* 32.3*  MCV 93.5 92.7 91.0  PLT 164 180 239   Cardiac Enzymes: No results found for this basename: CKTOTAL, CKMB, CKMBINDEX, TROPONINI,  in the last 168 hours BNP: BNP (last 3 results)  Recent Labs  06/09/14 1344 06/12/14 1330  PROBNP 1271.0* 1666.0*   CBG:  Recent Labs Lab 06/18/14 1154 06/18/14 1801 06/18/14 2148 06/19/14 0801 06/19/14 1212  GLUCAP 255* 132* 144* 131* 123*    Time coordinating discharge: 35 minutes  Signed:  Marisol Giambra  [facility] 06/19/2014, 3:09 PM

## 2014-06-19 DIAGNOSIS — N183 Chronic kidney disease, stage 3 unspecified: Secondary | ICD-10-CM

## 2014-06-19 DIAGNOSIS — J441 Chronic obstructive pulmonary disease with (acute) exacerbation: Secondary | ICD-10-CM

## 2014-06-19 LAB — URINE CULTURE
Colony Count: NO GROWTH
Culture: NO GROWTH
Special Requests: NORMAL

## 2014-06-19 LAB — GLUCOSE, CAPILLARY
Glucose-Capillary: 131 mg/dL — ABNORMAL HIGH (ref 70–99)
Glucose-Capillary: 123 mg/dL — ABNORMAL HIGH (ref 70–99)

## 2014-06-19 MED ORDER — ALBUTEROL SULFATE (2.5 MG/3ML) 0.083% IN NEBU: 2.5000 mg | INHALATION_SOLUTION | RESPIRATORY_TRACT | Status: DC | PRN

## 2014-06-19 MED ORDER — METOPROLOL TARTRATE 50 MG PO TABS: 50.0000 mg | ORAL_TABLET | Freq: Two times a day (BID) | ORAL | Status: DC

## 2014-06-19 MED ORDER — FUROSEMIDE 80 MG PO TABS
80.0000 mg | ORAL_TABLET | Freq: Once | ORAL | Status: AC
  Administered 2014-06-19: 80 mg via ORAL
  Filled 2014-06-19: qty 1

## 2014-06-19 MED ORDER — NYSTATIN 100000 UNIT/ML MT SUSP: 5.0000 mL | Freq: Four times a day (QID) | OROMUCOSAL | Status: DC

## 2014-06-19 NOTE — Progress Notes (Addendum)
Speech Language Pathology Treatment: Dysphagia  Patient Details Name: [name] [name] MRN: [id number] DOB: 11/10/1940 Today's Date: 06/19/2014 Time: 9983-3825 SLP Time Calculation (min): 30 min  Assessment / Plan / Recommendation Clinical Impression  Pt sitting in chair with church member and spouse visiting.  Pt sleepy but responded to verbal/visual stimulation.  Oral cavity examined and appeared with oral candidiasis.  Pt reports h/o being treated for thrush prior to this admit.    Observed pt consuming moist graham cracker, applesauce and water with good initial tolerance  - delayed coughing noted - ? Due to pt report of reflux/heartburn hx.   Recommend MD address pt's report of reflux twice a week as she denies taking medications for this issue prior to admit.  Pt reports taking a "medication" years ago for reflux issues but she has not taken it recently.   Please note, pt was seeing SLP via [facility] for dysphagia management per her report and follow up at SNF indicated due to premorbid deficits.    Intake listed as 50-80% and pt reports good tolerance.  Recommend continue dys3/thin due to lack of dentition (*dentures at home and pt reports wearing inconsistently) and current respiratory condition.  Education re: aspiration precautions given pt's COPD reviewed using teach back.  No further acute SLP warranted at this time.    HPI HPI: 73 y.o. female with prior h/o hypertension, DM, CVA, stage 2 CKd, was brought in by EMS for altered mental status. Pt on biPap when SLP entered room to complete BSE; pt tolerated being placed on [place] during BSE, CXR on 06/13/14 indicated 1. Improved aeration without significant residual interstitial disease.Small left pleural effusion; Minimal left basilar airspace disease likely reflects atelectasis. MRI head on 06/12/14 indicated no new changes   Pertinent Vitals Pain Assessment: No/denies pain  SLP Plan  All goals met    Recommendations Diet recommendations: Dysphagia 3  (mechanical soft) Liquids provided via: Cup;Straw Medication Administration:  (as tolerated) Supervision: Patient able to self feed;Intermittent supervision to cue for compensatory strategies Compensations: Slow rate;Small sips/bites;Check for pocketing Postural Changes and/or Swallow Maneuvers: Seated upright 90 degrees;Upright 30-60 min after meal              Oral Care Recommendations: Oral care BID Plan: All goals met    GO     Claudie Fisherman, [facility] [place] [phone]

## 2014-06-19 NOTE — Progress Notes (Signed)
D/c with husband and non emergent ambulance transfer [facility]. D/c paperwork dent with patient and report called to facility.  VSS. Breathing regular and unlabored on 0.5 liters O2.

## 2014-06-19 NOTE — Progress Notes (Signed)
[facility] has been [facility]. Pt has [employer] authorization # [id number]. Pt awaiting discharg.   [name] Lollingoris [name] [name], MSW Clinical Social Worker [phone]

## 2014-06-22 ENCOUNTER — Non-Acute Institutional Stay (SKILLED_NURSING_FACILITY): Payer: Commercial Managed Care - HMO | Admitting: Internal Medicine

## 2014-06-22 DIAGNOSIS — N183 Chronic kidney disease, stage 3 unspecified: Secondary | ICD-10-CM

## 2014-06-22 DIAGNOSIS — IMO0002 Reserved for concepts with insufficient information to code with codable children: Secondary | ICD-10-CM

## 2014-06-22 DIAGNOSIS — E1129 Type 2 diabetes mellitus with other diabetic kidney complication: Secondary | ICD-10-CM

## 2014-06-22 DIAGNOSIS — E1165 Type 2 diabetes mellitus with hyperglycemia: Principal | ICD-10-CM

## 2014-06-22 DIAGNOSIS — I150 Renovascular hypertension: Secondary | ICD-10-CM | POA: Insufficient documentation

## 2014-06-22 DIAGNOSIS — E039 Hypothyroidism, unspecified: Secondary | ICD-10-CM

## 2014-06-22 NOTE — Progress Notes (Signed)
HISTORY & PHYSICAL  DATE: 06/22/2014   FACILITY: [facility]  LEVEL OF CARE: SNF (31)  ALLERGIES:  Allergies  Allergen Reactions  . Erythromycin Shortness Of Breath  . Iodine Hives  . Penicillins Hives    CHIEF COMPLAINT:  Manage diabetes mellitus, chronic kidney disease stage III and hypothyroidism  HISTORY OF PRESENT ILLNESS: [name] was hospitalized secondary to acute respiratory failure and hypoxemia. After hospitalization she is admitted to this facility for short-term rehabilitation.  DM:pt's DM was unstable.  Pt denies polyuria, polydipsia, polyphagia, changes in vision or hypoglycemic episodes.  No complications noted from the medication presently being used.  Last hemoglobin A1c is: 7.7 in 8-15.  CHRONIC KIDNEY DISEASE: The patient's chronic kidney disease remains stable.  Patient denies increasing lower extremity swelling or confusion. Last BUN and creatinine are: 30, 1.35.  HYPOTHYROIDISM: The hypothyroidism remains stable. No complications noted from the medications presently being used.  The patient denies fatigue or constipation.  Last TSH is 0.672 in 8-15.  PAST MEDICAL HISTORY :  Past Medical History  Diagnosis Date  . Hypertension   . Diabetes mellitus   . Chronic renal insufficiency   . Hyperlipidemia   . Osteopenia   . CVA (cerebral infarction)     PAST SURGICAL HISTORY: Past Surgical History  Procedure Laterality Date  . Eye surgery    . Loop recorder implant  03-24-2014    MDT LinQ implanted by Dr Johney Frame for cryptogenic stroke  . Tee without cardioversion N/A 03/23/2014    Procedure: TRANSESOPHAGEAL ECHOCARDIOGRAM (TEE);  Surgeon: Lars Masson, MD;  Location: [facility] ENDOSCOPY;  Service: Cardiovascular;  Laterality: N/A;    SOCIAL HISTORY:  reports that she quit smoking about 1 weeks ago. Her smoking use included Cigarettes. She has a 30 pack-year smoking history. She has never used smokeless  tobacco. She reports that she drinks alcohol. She reports that she does not use illicit drugs.  FAMILY HISTORY:  Family History  Problem Relation Age of Onset  . Diabetes Mother   . Hypertension Mother     CURRENT MEDICATIONS: Reviewed per MAR/see medication list  REVIEW OF SYSTEMS:  See HPI otherwise 14 point ROS is negative.  PHYSICAL EXAMINATION  VS:  See VS section  GENERAL: no acute distress, moderately obese body habitus EYES: conjunctivae normal, sclerae normal, normal eye lids MOUTH/THROAT: lips without lesions,no lesions in the mouth,tongue is without lesions,uvula elevates in midline NECK: supple, trachea midline, no neck masses, no thyroid tenderness, no thyromegaly LYMPHATICS: no LAN in the neck, no supraclavicular LAN RESPIRATORY: breathing is even & unlabored, BS CTAB CARDIAC: RRR, no murmur,no extra heart sounds, +3 bilateral lower extremity edema GI:  ABDOMEN: abdomen soft, normal BS, no masses, no tenderness  LIVER/SPLEEN: no hepatomegaly, no splenomegaly MUSCULOSKELETAL: HEAD: normal to inspection  EXTREMITIES: LEFT UPPER EXTREMITY: full range of motion, normal strength & tone RIGHT UPPER EXTREMITY:  full range of motion, normal strength & tone LEFT LOWER EXTREMITY:  Minimal range of motion, decreased strength & tone RIGHT LOWER EXTREMITY:  Minimal range of motion, decreased strength & tone PSYCHIATRIC: the patient is alert & oriented to person, affect & behavior appropriate  LABS/RADIOLOGY:  Labs reviewed: Basic Metabolic Panel:  Recent Labs  09/81/19 0315  04/27/14 0200  04/28/14 0947 04/29/14 0243  06/14/14 0359 06/15/14 0815 06/17/14 0439  NA 146  < > 143  < >  --   --   < > 139  145 147  K 3.2*  < > 2.8*  < >  --   --   < > 4.9 5.0 4.6  CL 105  < > 106  < >  --   --   < > 102 109 112  CO2 23  < > 23  < >  --   --   < > 24 25 27   GLUCOSE 89  < > 215*  < >  --   --   < > 267* 101* 49*  BUN 26*  < > 8  < >  --   --   < > 31* 35* 30*    CREATININE 1.58*  < > 1.18*  < >  --   --   < > 1.48* 1.47* 1.35*  CALCIUM 8.6  < > 8.6  < >  --   --   < > 8.3* 8.4 8.0*  MG 1.4*  < > 1.4*  --  1.5 1.9  --   --   --   --   PHOS 3.2  --   --   --   --   --   --   --   --   --   < > = values in this interval not displayed. Liver Function Tests:  Recent Labs  05/04/14 0500 06/12/14 1134 06/17/14 0439  AST 12 17 12   ALT 8 21 13   ALKPHOS 67 81 50  BILITOT 0.3 0.3 <0.2*  PROT 6.3 6.5 5.5*  ALBUMIN 2.7* 2.8* 2.3*   CBC:  Recent Labs  04/23/14 2258  06/09/14 1335 06/12/14 1134  06/13/14 0353 06/14/14 0359 06/17/14 0439  WBC 8.0  < > 3.9* 8.8  --  4.6 8.9 8.6  NEUTROABS 4.9  --  2.2 8.3*  --   --   --   --   HGB 12.7  < > 9.2* 9.1*  < > 8.8* 9.0* 10.3*  HCT 37.0  < > 28.2* 28.7*  < > 27.3* 27.9* 32.3*  MCV 87.3  < > 92.2 92.0  --  93.5 92.7 91.0  PLT 251  < > 180 188  --  164 180 239  < > = values in this interval not displayed.  Lipid Panel:  Recent Labs  01/10/14 0520 03/21/14 0440 04/25/14 0315  HDL 45 48 37*   Cardiac Enzymes:  Recent Labs  04/24/14 1657 04/24/14 2249 05/01/14 1250  TROPONINI <0.30 <0.30 <0.30   CBG:  Recent Labs  06/18/14 2148 06/19/14 0801 06/19/14 1212  GLUCAP 144* 131* 123*    Transesophageal Echocardiography  Patient:    [name] [name], [name] [name] MR #:       [id number] Study Date: 03/23/2014 Gender:     F Age:        46 Height:     165.1 cm Weight:     88.2 kg BSA:        2.04 m^2 Pt. Status: Room:       4N20C   SONOGRAPHER  Perley Jain, RDCS  ADMITTING    Andreas Blower A  ATTENDING    Ghimire, Shanker Lenna Gilford [id number]  PERFORMING   Tobias Alexander, M.D.  cc:  ------------------------------------------------------------------- LV EF: 60% -   65%  ------------------------------------------------------------------- Indications:      CVA 436.  ------------------------------------------------------------------- Study Conclusions  - Left ventricle:  There was mild concentric hypertrophy. Systolic   function was normal. The estimated ejection fraction was  in the   range of 60% to 65%. Wall motion was normal; there were no   regional wall motion abnormalities. - Mitral valve: There was mild regurgitation. - Left atrium: No evidence of thrombus in the atrial cavity or   appendage. No evidence of thrombus in the atrial cavity or   appendage. No evidence of thrombus in the appendage. - Right atrium: No evidence of thrombus in the atrial cavity or   appendage. - Atrial septum: There was a patent foramen ovale.  Impressions:  - PFO is present.  -------------------------------------------------------------------  ------------------------------------------------------------------- Left ventricle:  There was mild concentric hypertrophy. Systolic function was normal. The estimated ejection fraction was in the range of 60% to 65%. Wall motion was normal; there were no regional wall motion abnormalities.  ------------------------------------------------------------------- Aortic valve:   Structurally normal valve. Trileaflet; normal thickness leaflets. Cusp separation was normal.  Doppler:  There was no significant regurgitation.  ------------------------------------------------------------------- Aorta:  Ther eis moderate non-mobile atherosclerotic plague. There was no atheroma. There was no evidence for dissection. Aortic root: The aortic root was not dilated. Ascending aorta: The ascending aorta was normal in size. Aortic arch: The aortic arch was normal in size. Descending aorta: The descending aorta was normal in size.  ------------------------------------------------------------------- Mitral valve:   Structurally normal valve.   Leaflet separation was normal.  Doppler:  There was mild regurgitation.  ------------------------------------------------------------------- Left atrium:  The atrium was normal in size.  No evidence  of thrombus in the atrial cavity or appendage.  No evidence of thrombus in the atrial cavity or appendage.  No evidence of thrombus in the appendage. The appendage was morphologically a left appendage, multilobulated, and of normal size. Emptying velocity was normal.  ------------------------------------------------------------------- Atrial septum:  There was a patent foramen ovale.  ------------------------------------------------------------------- Right ventricle:  The cavity size was normal. Wall thickness was normal. Systolic function was normal.  ------------------------------------------------------------------- Pulmonic valve:    Structurally normal valve.  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  Structurally normal valve.   Leaflet separation was normal.  Doppler:  There was mild regurgitation.  ------------------------------------------------------------------- Pulmonary artery:   The main pulmonary artery was normal-sized.  ------------------------------------------------------------------- Right atrium:  Prominent persistent Eustachian valve is present. The atrium was normal in size.  No evidence of thrombus in the atrial cavity or appendage. The appendage was morphologically a right appendage.  ------------------------------------------------------------------- Pericardium:  There was no pericardial effusion.   ------------------------------------------------------------------- Post procedure conclusions Ascending Aorta:  - Ther eis moderate non-mobile atherosclerotic plague.  CT HEAD WITHOUT CONTRAST   TECHNIQUE: Contiguous axial images were obtained from the base of the skull through the vertex without intravenous contrast.   COMPARISON:  05/01/2014   FINDINGS: There is no evidence of acute cortical infarct, intracranial hemorrhage, mass,  midline shift, or extra-axial fluid collection. There is mild generalized cerebral atrophy, unchanged. Patchy and confluent periventricular white matter hypodensities do not appear significantly changed and are compatible with moderate chronic small vessel ischemic disease. Old lacunar infarcts are again seen in the basal ganglia and thalami bilaterally. Old bilateral cerebellar infarcts are also again seen.   Prior bilateral cataract extraction is noted. Mastoid air cells and paranasal sinuses are clear.   IMPRESSION: 1. No evidence of acute intracranial abnormality. 2. Unchanged chronic small vessel ischemic disease and old infarcts.     PORTABLE CHEST - 1 VIEW   COMPARISON:  One-view chest 06/12/2014   FINDINGS: The heart size is normal. Aeration is improved. A  small left pleural effusion is suspected. Minimal left basilar airspace disease likely reflects atelectasis. Atherosclerotic changes are noted at the aorta.   IMPRESSION: 1. Improved aeration without significant residual interstitial disease. 2. Small left pleural effusion. 3. Minimal left basilar airspace disease likely reflects atelectasis.    ASSESSMENT/PLAN:  Diabetes mellitus with renal complications-uncontrolled. 70/30 insulin was increased. Chronic kidney disease stage III-recheck renal functions Hypothyroidism-controlled Renovascular hypertension-blood pressure borderline. Will monitor. Neuropathy-continue Neurontin CVA-continue Plavix Check CBC and BMP  I have reviewed patient's medical records received at admission/from hospitalization.  CPT CODE: 96045  Angela Cox, MD [facility] [phone]

## 2014-06-24 ENCOUNTER — Encounter: Payer: Self-pay | Admitting: Internal Medicine

## 2014-06-24 ENCOUNTER — Non-Acute Institutional Stay (SKILLED_NURSING_FACILITY): Payer: Commercial Managed Care - HMO | Admitting: Internal Medicine

## 2014-06-24 DIAGNOSIS — N183 Chronic kidney disease, stage 3 unspecified: Secondary | ICD-10-CM

## 2014-06-24 DIAGNOSIS — I150 Renovascular hypertension: Secondary | ICD-10-CM

## 2014-06-24 DIAGNOSIS — N039 Chronic nephritic syndrome with unspecified morphologic changes: Principal | ICD-10-CM

## 2014-06-24 DIAGNOSIS — D631 Anemia in chronic kidney disease: Secondary | ICD-10-CM

## 2014-06-26 NOTE — Progress Notes (Signed)
Loop recorder 

## 2014-06-29 ENCOUNTER — Other Ambulatory Visit: Payer: Self-pay | Admitting: Internal Medicine

## 2014-06-29 NOTE — Progress Notes (Signed)
Patient ID: [name] [name], female   DOB: 25-Jul-1941, 73 y.o.   MRN: [id number]           PROGRESS NOTE  DATE: 06/24/2014             FACILITY:  [facility]  LEVEL OF CARE: SNF (31)  Acute Visit  CHIEF COMPLAINT:  Manage anemia of chronic kidney disease and chronic kidney disease.    HISTORY OF PRESENT ILLNESS: I was requested by the staff to assess the patient regarding above problem(s):  ANEMIA: The anemia has been stable. The patient denies fatigue, melena or hematochezia. No complications from the medications currently being used.  On 06/23/2014:  Hemoglobin 10.5, MCV 91.  On 06/12/2014:  Hemoglobin 10.5.  The patient's anemia is secondary to chronic kidney disease.    CHRONIC KIDNEY DISEASE: The patient's chronic kidney disease remains stable.  Patient denies increasing lower extremity swelling or confusion. Last BUN and creatinine are:   On 06/23/2014:  BUN 24, creatinine 1.32.  On 06/12/2014:  BUN 18, creatinine 1.2.    PAST MEDICAL HISTORY : Reviewed.  No changes/see problem list  CURRENT MEDICATIONS: Reviewed per MAR/see medication list  REVIEW OF SYSTEMS:  GENERAL: no change in appetite, no fatigue, no weight changes, no fever, chills or weakness RESPIRATORY: no cough, SOB, DOE,, wheezing, hemoptysis CARDIAC: no chest pain or palpitations;  chronic lower extremity swelling      GI: no abdominal pain, diarrhea, constipation, heart burn, nausea or vomiting  PHYSICAL EXAMINATION  VS: see VS section  GENERAL: no acute distress, moderately obese body habitus EYES: conjunctivae normal, sclerae normal, normal eye lids NECK: supple, trachea midline, no neck masses, no thyroid tenderness, no thyromegaly LYMPHATICS: no LAN in the neck, no supraclavicular LAN RESPIRATORY: breathing is even & unlabored, BS CTAB CARDIAC: RRR, no murmur,no extra heart sounds, left lower extremity has +3 edema, right lower extremity has +2 edema       GI: abdomen soft, normal BS,  no masses, no tenderness, no hepatomegaly, no splenomegaly PSYCHIATRIC: the patient is alert & oriented to person, affect & behavior appropriate  ASSESSMENT/PLAN:  Anemia of chronic kidney disease.  Hemoglobin stable.    Chronic kidney disease.  Renal functions stable.    Renovascular hypertension.  Last blood pressure elevated.  We will review a log in five days.  Check blood pressure q.shift.    CPT CODE: 40981           Angela Cox, MD [facility] [phone]

## 2014-06-30 ENCOUNTER — Encounter: Payer: Self-pay | Admitting: Neurology

## 2014-07-06 ENCOUNTER — Non-Acute Institutional Stay (SKILLED_NURSING_FACILITY): Payer: Commercial Managed Care - HMO | Admitting: Internal Medicine

## 2014-07-06 DIAGNOSIS — N183 Chronic kidney disease, stage 3 unspecified: Secondary | ICD-10-CM

## 2014-07-06 DIAGNOSIS — I150 Renovascular hypertension: Secondary | ICD-10-CM

## 2014-07-06 DIAGNOSIS — R609 Edema, unspecified: Secondary | ICD-10-CM

## 2014-07-08 ENCOUNTER — Non-Acute Institutional Stay (SKILLED_NURSING_FACILITY): Payer: Commercial Managed Care - HMO | Admitting: Internal Medicine

## 2014-07-08 DIAGNOSIS — I150 Renovascular hypertension: Secondary | ICD-10-CM

## 2014-07-08 DIAGNOSIS — K219 Gastro-esophageal reflux disease without esophagitis: Secondary | ICD-10-CM

## 2014-07-14 DIAGNOSIS — R609 Edema, unspecified: Secondary | ICD-10-CM | POA: Insufficient documentation

## 2014-07-14 NOTE — Progress Notes (Signed)
Patient ID: [name] [name], female   DOB: 11-15-1940, 73 y.o.   MRN: [id number]           PROGRESS NOTE  DATE: 07/06/2014          FACILITY:  [facility]  LEVEL OF CARE: SNF (31)  Acute Visit  CHIEF COMPLAINT:  Manage lower extremity swelling.    HISTORY OF PRESENT ILLNESS: I was requested by the staff to assess the patient regarding above problem(s):  EDEMA: The patient's edema is unstable.  Staff is complaining that patient's lower extremities have more edema.  Patient denies calf pain, chest pain or shortness of breath. No complications reported from the medications currently being used.  She has a history of chronic kidney disease and  chronic lower extremity swelling.     PAST MEDICAL HISTORY : Reviewed.  No changes/see problem list  CURRENT MEDICATIONS: Reviewed per MAR/see medication list  REVIEW OF SYSTEMS:  GENERAL: no change in appetite, no fatigue, no weight changes, no fever, chills or weakness RESPIRATORY: no cough, SOB, DOE,, wheezing, hemoptysis CARDIAC: no chest pain or palpitations;  chronic lower extremity swelling       GI: no abdominal pain, diarrhea, constipation, heart burn, nausea or vomiting  PHYSICAL EXAMINATION  VS: see VS section  GENERAL: no acute distress, moderately obese body habitus EYES: conjunctivae normal, sclerae normal, normal eye lids NECK: supple, trachea midline, no neck masses, no thyroid tenderness, no thyromegaly LYMPHATICS: no LAN in the neck, no supraclavicular LAN RESPIRATORY: breathing is even & unlabored, BS CTAB CARDIAC: RRR, no murmur,no extra heart sounds, +3 bilateral lower extremity edema    GI: abdomen soft, normal BS, no masses, no tenderness, no hepatomegaly, no splenomegaly PSYCHIATRIC: the patient is alert & oriented to person, affect & behavior appropriate  LABS/RADIOLOGY:  On 06/23/2014:  BUN 24, creatinine 1.32.      ASSESSMENT/PLAN:  Lower extremity edema.  Unstable problem.   Start Demadex  25 mg q.d.    Chronic kidney disease.  Recheck BMP in two days since I am initiating Demadex.      Renovascular hypertension.   Uncontrolled.  Should be covered with Demadex.     CPT CODE: 64332           Angela Cox, MD [facility] [phone]

## 2014-07-15 ENCOUNTER — Non-Acute Institutional Stay (SKILLED_NURSING_FACILITY): Payer: Commercial Managed Care - HMO | Admitting: Internal Medicine

## 2014-07-15 DIAGNOSIS — E1149 Type 2 diabetes mellitus with other diabetic neurological complication: Secondary | ICD-10-CM

## 2014-07-15 DIAGNOSIS — I6990 Unspecified sequelae of unspecified cerebrovascular disease: Secondary | ICD-10-CM

## 2014-07-15 DIAGNOSIS — N183 Chronic kidney disease, stage 3 unspecified: Secondary | ICD-10-CM

## 2014-07-15 DIAGNOSIS — G909 Disorder of the autonomic nervous system, unspecified: Secondary | ICD-10-CM

## 2014-07-15 DIAGNOSIS — E1143 Type 2 diabetes mellitus with diabetic autonomic (poly)neuropathy: Secondary | ICD-10-CM

## 2014-07-15 DIAGNOSIS — E1129 Type 2 diabetes mellitus with other diabetic kidney complication: Secondary | ICD-10-CM

## 2014-07-16 DIAGNOSIS — K219 Gastro-esophageal reflux disease without esophagitis: Secondary | ICD-10-CM | POA: Insufficient documentation

## 2014-07-16 NOTE — Progress Notes (Signed)
Patient ID: [name] [name], female   DOB: [birth year], 73 y.o.   MRN: [id number]           PROGRESS NOTE  DATE: 07/08/2014        FACILITY:  [facility]  LEVEL OF CARE: SNF (31)  Acute Visit  CHIEF COMPLAINT:  Manage GERD.    HISTORY OF PRESENT ILLNESS: I was requested by the staff to assess the patient regarding above problem(s):  Patient is complaining of heartburn and requests a medication.  She denies nausea, vomiting, or abdominal pain.    PAST MEDICAL HISTORY : Reviewed.  No changes/see problem list  CURRENT MEDICATIONS: Reviewed per MAR/see medication list  REVIEW OF SYSTEMS:  GENERAL: no change in appetite, no fatigue, no weight changes, no fever, chills or weakness RESPIRATORY: no cough, SOB, DOE,, wheezing, hemoptysis CARDIAC: no chest pain or palpitations; lower extremity swelling     GI: no abdominal pain, diarrhea, constipation, heart burn, nausea or vomiting  PHYSICAL EXAMINATION  VS: see VS section  GENERAL: no acute distress, moderately obese body habitus NECK: supple, trachea midline, no neck masses, no thyroid tenderness, no thyromegaly RESPIRATORY: breathing is even & unlabored, BS CTAB CARDIAC: RRR, no murmur,no extra heart sounds, +3 bilateral lower extremity edema     GI: abdomen soft, normal BS, no masses, no tenderness, no hepatomegaly, no splenomegaly PSYCHIATRIC: the patient is alert & oriented to person, affect & behavior appropriate  ASSESSMENT/PLAN:  GERD.  New problem.  Start Protonix 40 mg q.d.    Renovascular hypertension.  Last blood pressure elevated.  We will review a log.    CPT CODE: 91478            Angela Cox, MD [facility] [phone]

## 2014-07-17 ENCOUNTER — Ambulatory Visit: Payer: Self-pay | Admitting: Neurology

## 2014-07-17 DIAGNOSIS — E1140 Type 2 diabetes mellitus with diabetic neuropathy, unspecified: Secondary | ICD-10-CM | POA: Insufficient documentation

## 2014-07-17 DIAGNOSIS — E1129 Type 2 diabetes mellitus with other diabetic kidney complication: Secondary | ICD-10-CM | POA: Insufficient documentation

## 2014-07-17 DIAGNOSIS — I6990 Unspecified sequelae of unspecified cerebrovascular disease: Secondary | ICD-10-CM | POA: Insufficient documentation

## 2014-07-17 NOTE — Progress Notes (Signed)
         PROGRESS NOTE  DATE: 07/15/2014  FACILITY: Nursing Home Location: [facility]  LEVEL OF CARE: SNF (31)  Routine Visit  CHIEF COMPLAINT:  Manage chronic kidney disease stage III, diabetes mellitus and CVA  HISTORY OF PRESENT ILLNESS:  REASSESSMENT OF ONGOING PROBLEM(S):  CHRONIC KIDNEY DISEASE: The patient's chronic kidney disease remains stable.  Patient denies increasing lower extremity swelling or confusion. Last BUN and creatinine are: 21, 1.32 on 07-08-14. in 8-15 creatinine 1.32.  CVA: The patient's CVA remains stable.  Patient denies new neurologic symptoms such as numbness, tingling, weakness, speech difficulties or visual disturbances.  No complications reported from the medications currently being used.  DM:pt's DM remains stable.  Pt denies polyuria, polydipsia, polyphagia, changes in vision or hypoglycemic episodes.  No complications noted from the medication presently being used.  Last hemoglobin A1c is: Not available.  PAST MEDICAL HISTORY : Reviewed.  No changes/see problem list  CURRENT MEDICATIONS: Reviewed per MAR/see medication list  REVIEW OF SYSTEMS:  GENERAL: no change in appetite, no fatigue, no weight changes, no fever, chills or weakness RESPIRATORY: no cough, SOB, DOE, wheezing, hemoptysis CARDIAC: no chest pain,  or palpitations, complaints of chronic lower extremity swelling GI: no abdominal pain, diarrhea, constipation, heart burn, nausea or vomiting  PHYSICAL EXAMINATION  VS:  See VS section  GENERAL: no acute distress, moderately obese body habitus EYES: conjunctivae normal, sclerae normal, normal eye lids NECK: supple, trachea midline, no neck masses, no thyroid tenderness, no thyromegaly LYMPHATICS: no LAN in the neck, no supraclavicular LAN RESPIRATORY: breathing is even & unlabored, BS CTAB CARDIAC: RRR, no murmur,no extra heart sounds, +3 bilateral lower extremity edema GI: abdomen soft, normal BS, no masses,  no tenderness, no hepatomegaly, no splenomegaly PSYCHIATRIC: the patient is alert & oriented to person, affect & behavior appropriate  LABS/RADIOLOGY:  9-15 creatinine 1.32 otherwise BMP normal 8-15 hemoglobin 10.5, MCV 91 otherwise CBC normal  ASSESSMENT/PLAN:  Chronic kidney disease stage III-stable Diabetes mellitus with renal complications-check hemoglobin A1c CVA-continue supportive care Neuropathy-continue Neurontin Hypothyroidism-check TSH Hyperlipidemia-check fasting lipid panel Renovascular hypertension-well-controlled Check liver profile  CPT CODE: 16109  Angela Cox, MD [facility] [phone]

## 2014-07-20 ENCOUNTER — Non-Acute Institutional Stay (SKILLED_NURSING_FACILITY): Payer: Commercial Managed Care - HMO | Admitting: Internal Medicine

## 2014-07-20 DIAGNOSIS — N183 Chronic kidney disease, stage 3 unspecified: Secondary | ICD-10-CM

## 2014-07-20 DIAGNOSIS — I6990 Unspecified sequelae of unspecified cerebrovascular disease: Secondary | ICD-10-CM

## 2014-07-20 DIAGNOSIS — E1129 Type 2 diabetes mellitus with other diabetic kidney complication: Secondary | ICD-10-CM

## 2014-07-20 DIAGNOSIS — E1149 Type 2 diabetes mellitus with other diabetic neurological complication: Secondary | ICD-10-CM

## 2014-07-20 DIAGNOSIS — G909 Disorder of the autonomic nervous system, unspecified: Secondary | ICD-10-CM

## 2014-07-20 DIAGNOSIS — E1143 Type 2 diabetes mellitus with diabetic autonomic (poly)neuropathy: Secondary | ICD-10-CM

## 2014-07-20 LAB — MDC_IDC_ENUM_SESS_TYPE_REMOTE
MDC IDC SESS DTM: 20150807091706
MDC IDC SET ZONE DETECTION INTERVAL: 2000 ms
MDC IDC SET ZONE DETECTION INTERVAL: 380 ms
Zone Setting Detection Interval: 3000 ms

## 2014-07-20 NOTE — Progress Notes (Addendum)
         PROGRESS NOTE  DATE: 07-20-14  FACILITY: Nursing Home Location: [facility]  LEVEL OF CARE: SNF (31)  Discharge Visit  CHIEF COMPLAINT:  Manage chronic kidney disease stage III, diabetes mellitus and CVA  HISTORY OF PRESENT ILLNESS: I was requested by the social worker to perform face-to-face evaluation for discharge on 07-21-14.  REASSESSMENT OF ONGOING PROBLEM(S):  CHRONIC KIDNEY DISEASE: The patient's chronic kidney disease remains stable.  Patient denies increasing lower extremity swelling or confusion. Last BUN and creatinine are: 21, 1.32 on 07-08-14. in 8-15 creatinine 1.32.  CVA: The patient's CVA remains stable.  Patient denies new neurologic symptoms such as numbness, tingling, weakness, speech difficulties or visual disturbances.  No complications reported from the medications currently being used.  DM:pt's DM remains stable.  Pt denies polyuria, polydipsia, polyphagia, changes in vision or hypoglycemic episodes.  No complications noted from the medication presently being used.  Last hemoglobin A1c is: Not available.  PAST MEDICAL HISTORY : Reviewed.  No changes/see problem list  CURRENT MEDICATIONS: Reviewed per MAR/see medication list  REVIEW OF SYSTEMS:  GENERAL: no change in appetite, no fatigue, no weight changes, no fever, chills or weakness RESPIRATORY: no cough, SOB, DOE, wheezing, hemoptysis CARDIAC: no chest pain,  or palpitations, complaints of chronic lower extremity swelling GI: no abdominal pain, diarrhea, constipation, heart burn, nausea or vomiting  PHYSICAL EXAMINATION  VS:  See VS section  GENERAL: no acute distress, moderately obese body habitus EYES: conjunctivae normal, sclerae normal, normal eye lids NECK: supple, trachea midline, no neck masses, no thyroid tenderness, no thyromegaly LYMPHATICS: no LAN in the neck, no supraclavicular LAN RESPIRATORY: breathing is even & unlabored, BS CTAB CARDIAC: RRR, no  murmur,no extra heart sounds, +3 bilateral lower extremity edema GI: abdomen soft, normal BS, no masses, no tenderness, no hepatomegaly, no splenomegaly PSYCHIATRIC: the patient is alert & oriented to person, affect & behavior appropriate  LABS/RADIOLOGY:  9-15 creatinine 1.32 otherwise BMP normal 8-15 hemoglobin 10.5, MCV 91 otherwise CBC normal  ASSESSMENT/PLAN:  Chronic kidney disease stage III-stable Diabetes mellitus with renal complications-continue current medications CVA-continue supportive care Neuropathy-continue Neurontin Hypothyroidism-continue levothyroxine Hyperlipidemia-continue statin Renovascular hypertension-well-controlled  I have filled out patient's discharge paperwork and written prescriptions. Patient will receive home health PT, OT, ST, and CNA DME-none  Discharge time  30 minutes which involved coordination of the discharge process with the physical therapists, nursing staff and social worker.  CPT CODE: 29562  Angela Cox, MD [facility] [phone]

## 2014-07-21 ENCOUNTER — Other Ambulatory Visit: Payer: Self-pay | Admitting: Internal Medicine

## 2014-07-23 ENCOUNTER — Encounter: Payer: Self-pay | Admitting: Internal Medicine

## 2014-07-24 ENCOUNTER — Encounter (HOSPITAL_COMMUNITY): Payer: Self-pay | Admitting: Emergency Medicine

## 2014-07-24 ENCOUNTER — Emergency Department (HOSPITAL_COMMUNITY): Payer: Medicare HMO

## 2014-07-24 ENCOUNTER — Inpatient Hospital Stay (HOSPITAL_COMMUNITY): Payer: Medicare HMO

## 2014-07-24 ENCOUNTER — Inpatient Hospital Stay (HOSPITAL_COMMUNITY)
Admission: EM | Admit: 2014-07-24 | Discharge: 2014-08-05 | DRG: 208 | Disposition: A | Discharge status: Skilled Nursing Facility | Payer: Medicare HMO | Attending: Internal Medicine | Admitting: Internal Medicine

## 2014-07-24 DIAGNOSIS — G9340 Encephalopathy, unspecified: Secondary | ICD-10-CM | POA: Diagnosis present

## 2014-07-24 DIAGNOSIS — Z79899 Other long term (current) drug therapy: Secondary | ICD-10-CM

## 2014-07-24 DIAGNOSIS — J4489 Other specified chronic obstructive pulmonary disease: Secondary | ICD-10-CM | POA: Diagnosis present

## 2014-07-24 DIAGNOSIS — Z6830 Body mass index (BMI) 30.0-30.9, adult: Secondary | ICD-10-CM | POA: Diagnosis not present

## 2014-07-24 DIAGNOSIS — Z8673 Personal history of transient ischemic attack (TIA), and cerebral infarction without residual deficits: Secondary | ICD-10-CM

## 2014-07-24 DIAGNOSIS — Z794 Long term (current) use of insulin: Secondary | ICD-10-CM

## 2014-07-24 DIAGNOSIS — E669 Obesity, unspecified: Secondary | ICD-10-CM | POA: Diagnosis present

## 2014-07-24 DIAGNOSIS — I639 Cerebral infarction, unspecified: Secondary | ICD-10-CM

## 2014-07-24 DIAGNOSIS — E039 Hypothyroidism, unspecified: Secondary | ICD-10-CM | POA: Diagnosis present

## 2014-07-24 DIAGNOSIS — R609 Edema, unspecified: Secondary | ICD-10-CM | POA: Diagnosis present

## 2014-07-24 DIAGNOSIS — J9600 Acute respiratory failure, unspecified whether with hypoxia or hypercapnia: Secondary | ICD-10-CM | POA: Diagnosis present

## 2014-07-24 DIAGNOSIS — I129 Hypertensive chronic kidney disease with stage 1 through stage 4 chronic kidney disease, or unspecified chronic kidney disease: Secondary | ICD-10-CM | POA: Diagnosis present

## 2014-07-24 DIAGNOSIS — J9692 Respiratory failure, unspecified with hypercapnia: Secondary | ICD-10-CM

## 2014-07-24 DIAGNOSIS — J438 Other emphysema: Secondary | ICD-10-CM

## 2014-07-24 DIAGNOSIS — R6520 Severe sepsis without septic shock: Secondary | ICD-10-CM

## 2014-07-24 DIAGNOSIS — E118 Type 2 diabetes mellitus with unspecified complications: Secondary | ICD-10-CM

## 2014-07-24 DIAGNOSIS — J9602 Acute respiratory failure with hypercapnia: Secondary | ICD-10-CM

## 2014-07-24 DIAGNOSIS — I509 Heart failure, unspecified: Secondary | ICD-10-CM | POA: Diagnosis present

## 2014-07-24 DIAGNOSIS — I10 Essential (primary) hypertension: Secondary | ICD-10-CM

## 2014-07-24 DIAGNOSIS — E785 Hyperlipidemia, unspecified: Secondary | ICD-10-CM | POA: Diagnosis present

## 2014-07-24 DIAGNOSIS — N179 Acute kidney failure, unspecified: Secondary | ICD-10-CM | POA: Diagnosis present

## 2014-07-24 DIAGNOSIS — J449 Chronic obstructive pulmonary disease, unspecified: Secondary | ICD-10-CM | POA: Diagnosis present

## 2014-07-24 DIAGNOSIS — J189 Pneumonia, unspecified organism: Secondary | ICD-10-CM | POA: Diagnosis present

## 2014-07-24 DIAGNOSIS — E1129 Type 2 diabetes mellitus with other diabetic kidney complication: Secondary | ICD-10-CM | POA: Diagnosis present

## 2014-07-24 DIAGNOSIS — Z87891 Personal history of nicotine dependence: Secondary | ICD-10-CM | POA: Diagnosis not present

## 2014-07-24 DIAGNOSIS — IMO0002 Reserved for concepts with insufficient information to code with codable children: Secondary | ICD-10-CM | POA: Diagnosis present

## 2014-07-24 DIAGNOSIS — I69959 Hemiplegia and hemiparesis following unspecified cerebrovascular disease affecting unspecified side: Secondary | ICD-10-CM

## 2014-07-24 DIAGNOSIS — Z833 Family history of diabetes mellitus: Secondary | ICD-10-CM

## 2014-07-24 DIAGNOSIS — G9341 Metabolic encephalopathy: Secondary | ICD-10-CM

## 2014-07-24 DIAGNOSIS — R739 Hyperglycemia, unspecified: Secondary | ICD-10-CM

## 2014-07-24 DIAGNOSIS — E1165 Type 2 diabetes mellitus with hyperglycemia: Secondary | ICD-10-CM | POA: Diagnosis present

## 2014-07-24 DIAGNOSIS — E1100 Type 2 diabetes mellitus with hyperosmolarity without nonketotic hyperglycemic-hyperosmolar coma (NKHHC): Secondary | ICD-10-CM | POA: Diagnosis present

## 2014-07-24 DIAGNOSIS — R1310 Dysphagia, unspecified: Secondary | ICD-10-CM

## 2014-07-24 DIAGNOSIS — J441 Chronic obstructive pulmonary disease with (acute) exacerbation: Secondary | ICD-10-CM

## 2014-07-24 DIAGNOSIS — N058 Unspecified nephritic syndrome with other morphologic changes: Secondary | ICD-10-CM | POA: Diagnosis present

## 2014-07-24 DIAGNOSIS — IMO0001 Reserved for inherently not codable concepts without codable children: Secondary | ICD-10-CM

## 2014-07-24 DIAGNOSIS — I4892 Unspecified atrial flutter: Secondary | ICD-10-CM | POA: Diagnosis present

## 2014-07-24 DIAGNOSIS — A419 Sepsis, unspecified organism: Secondary | ICD-10-CM

## 2014-07-24 DIAGNOSIS — E870 Hyperosmolality and hypernatremia: Secondary | ICD-10-CM

## 2014-07-24 DIAGNOSIS — E876 Hypokalemia: Secondary | ICD-10-CM | POA: Diagnosis not present

## 2014-07-24 DIAGNOSIS — I498 Other specified cardiac arrhythmias: Secondary | ICD-10-CM | POA: Diagnosis not present

## 2014-07-24 DIAGNOSIS — N183 Chronic kidney disease, stage 3 unspecified: Secondary | ICD-10-CM | POA: Diagnosis present

## 2014-07-24 DIAGNOSIS — I6350 Cerebral infarction due to unspecified occlusion or stenosis of unspecified cerebral artery: Secondary | ICD-10-CM

## 2014-07-24 DIAGNOSIS — Z880 Allergy status to penicillin: Secondary | ICD-10-CM

## 2014-07-24 DIAGNOSIS — M899 Disorder of bone, unspecified: Secondary | ICD-10-CM | POA: Diagnosis present

## 2014-07-24 DIAGNOSIS — J9601 Acute respiratory failure with hypoxia: Secondary | ICD-10-CM | POA: Diagnosis present

## 2014-07-24 DIAGNOSIS — Z8249 Family history of ischemic heart disease and other diseases of the circulatory system: Secondary | ICD-10-CM | POA: Diagnosis not present

## 2014-07-24 DIAGNOSIS — E079 Disorder of thyroid, unspecified: Secondary | ICD-10-CM | POA: Diagnosis present

## 2014-07-24 DIAGNOSIS — M949 Disorder of cartilage, unspecified: Secondary | ICD-10-CM

## 2014-07-24 DIAGNOSIS — N189 Chronic kidney disease, unspecified: Secondary | ICD-10-CM

## 2014-07-24 DIAGNOSIS — J9690 Respiratory failure, unspecified, unspecified whether with hypoxia or hypercapnia: Secondary | ICD-10-CM | POA: Diagnosis present

## 2014-07-24 DIAGNOSIS — J9691 Respiratory failure, unspecified with hypoxia: Secondary | ICD-10-CM

## 2014-07-24 DIAGNOSIS — I739 Peripheral vascular disease, unspecified: Secondary | ICD-10-CM | POA: Diagnosis present

## 2014-07-24 DIAGNOSIS — R0602 Shortness of breath: Secondary | ICD-10-CM | POA: Diagnosis present

## 2014-07-24 HISTORY — DX: Disorder of thyroid, unspecified: E07.9

## 2014-07-24 LAB — COMPREHENSIVE METABOLIC PANEL
ALK PHOS: 91 U/L (ref 39–117)
ALK PHOS: 73 U/L (ref 39–117)
ALT: 18 U/L (ref 0–35)
ALT: 14 U/L (ref 0–35)
ANION GAP: 19 — AB (ref 5–15)
AST: 17 U/L (ref 0–37)
AST: 14 U/L (ref 0–37)
Albumin: 3.3 g/dL — ABNORMAL LOW (ref 3.5–5.2)
Albumin: 2.8 g/dL — ABNORMAL LOW (ref 3.5–5.2)
Anion gap: 14 (ref 5–15)
BILIRUBIN TOTAL: 0.3 mg/dL (ref 0.3–1.2)
BUN: 34 mg/dL — ABNORMAL HIGH (ref 6–23)
BUN: 37 mg/dL — AB (ref 6–23)
CHLORIDE: 96 meq/L (ref 96–112)
CHLORIDE: 105 meq/L (ref 96–112)
CO2: 28 mEq/L (ref 19–32)
CO2: 30 meq/L (ref 19–32)
CREATININE: 1.44 mg/dL — AB (ref 0.50–1.10)
Calcium: 9.2 mg/dL (ref 8.4–10.5)
Calcium: 8.4 mg/dL (ref 8.4–10.5)
Creatinine, Ser: 1.66 mg/dL — ABNORMAL HIGH (ref 0.50–1.10)
GFR calc Af Amer: 40 mL/min — ABNORMAL LOW (ref >90)
GFR calc non Af Amer: 35 mL/min — ABNORMAL LOW (ref >90)
GFR, EST AFRICAN AMERICAN: 34 mL/min — AB (ref >90)
GFR, EST NON AFRICAN AMERICAN: 29 mL/min — AB (ref >90)
GLUCOSE: 301 mg/dL — AB (ref 70–99)
Glucose, Bld: 802 mg/dL (ref 70–99)
POTASSIUM: 5 meq/L (ref 3.7–5.3)
POTASSIUM: 3.7 meq/L (ref 3.7–5.3)
Sodium: 143 mEq/L (ref 137–147)
Sodium: 149 mEq/L — ABNORMAL HIGH (ref 137–147)
Total Bilirubin: 0.3 mg/dL (ref 0.3–1.2)
Total Protein: 6.8 g/dL (ref 6.0–8.3)
Total Protein: 6 g/dL (ref 6.0–8.3)

## 2014-07-24 LAB — I-STAT ARTERIAL BLOOD GAS, ED
ACID-BASE EXCESS: 3 mmol/L — AB (ref 0.0–2.0)
Acid-Base Excess: 3 mmol/L — ABNORMAL HIGH (ref 0.0–2.0)
BICARBONATE: 32 meq/L — AB (ref 20.0–24.0)
Bicarbonate: 30.6 mEq/L — ABNORMAL HIGH (ref 20.0–24.0)
O2 SAT: 83 %
O2 SAT: 99 %
PH ART: 7.291 — AB (ref 7.350–7.450)
PO2 ART: 57 mmHg — AB (ref 80.0–100.0)
PO2 ART: 175 mmHg — AB (ref 80.0–100.0)
Patient temperature: 98.6
TCO2: 34 mmol/L (ref 0–100)
TCO2: 33 mmol/L (ref 0–100)
pCO2 arterial: 72.7 mmHg (ref 35.0–45.0)
pCO2 arterial: 63.5 mmHg (ref 35.0–45.0)
pH, Arterial: 7.251 — ABNORMAL LOW (ref 7.350–7.450)

## 2014-07-24 LAB — BASIC METABOLIC PANEL
Anion gap: 11 (ref 5–15)
BUN: 38 mg/dL — AB (ref 6–23)
CHLORIDE: 106 meq/L (ref 96–112)
CO2: 33 mEq/L — ABNORMAL HIGH (ref 19–32)
Calcium: 8.5 mg/dL (ref 8.4–10.5)
Creatinine, Ser: 1.77 mg/dL — ABNORMAL HIGH (ref 0.50–1.10)
GFR calc Af Amer: 31 mL/min — ABNORMAL LOW (ref >90)
GFR calc non Af Amer: 27 mL/min — ABNORMAL LOW (ref >90)
Glucose, Bld: 207 mg/dL — ABNORMAL HIGH (ref 70–99)
Potassium: 4 mEq/L (ref 3.7–5.3)
Sodium: 150 mEq/L — ABNORMAL HIGH (ref 137–147)

## 2014-07-24 LAB — CBG MONITORING, ED
Glucose-Capillary: >600 mg/dL (ref 70–99)
Glucose-Capillary: >600 mg/dL (ref 70–99)
Glucose-Capillary: >600 mg/dL (ref 70–99)
Glucose-Capillary: 548 mg/dL — ABNORMAL HIGH (ref 70–99)

## 2014-07-24 LAB — CBC WITH DIFFERENTIAL/PLATELET
BASOS ABS: 0 10*3/uL (ref 0.0–0.1)
Basophils Relative: 0 % (ref 0–1)
EOS ABS: 0 10*3/uL (ref 0.0–0.7)
Eosinophils Relative: 0 % (ref 0–5)
HCT: 33.3 % — ABNORMAL LOW (ref 36.0–46.0)
Hemoglobin: 10.5 g/dL — ABNORMAL LOW (ref 12.0–15.0)
Lymphocytes Relative: 5 % — ABNORMAL LOW (ref 12–46)
Lymphs Abs: 0.5 10*3/uL — ABNORMAL LOW (ref 0.7–4.0)
MCH: 29.5 pg (ref 26.0–34.0)
MCHC: 31.5 g/dL (ref 30.0–36.0)
MCV: 93.5 fL (ref 78.0–100.0)
Monocytes Absolute: 0.6 10*3/uL (ref 0.1–1.0)
Monocytes Relative: 6 % (ref 3–12)
NEUTROS ABS: 8.1 10*3/uL — AB (ref 1.7–7.7)
Neutrophils Relative %: 89 % — ABNORMAL HIGH (ref 43–77)
Platelets: 237 10*3/uL (ref 150–400)
RBC: 3.56 MIL/uL — ABNORMAL LOW (ref 3.87–5.11)
RDW: 12.7 % (ref 11.5–15.5)
WBC: 9.1 10*3/uL (ref 4.0–10.5)

## 2014-07-24 LAB — URINALYSIS, ROUTINE W REFLEX MICROSCOPIC
BILIRUBIN URINE: NEGATIVE
Glucose, UA: >1000 mg/dL — AB
Hgb urine dipstick: NEGATIVE
Ketones, ur: 15 mg/dL — AB
Leukocytes, UA: NEGATIVE
NITRITE: NEGATIVE
Protein, ur: 30 mg/dL — AB
SPECIFIC GRAVITY, URINE: 1.027 (ref 1.005–1.030)
UROBILINOGEN UA: 0.2 mg/dL (ref 0.0–1.0)
pH: 5 (ref 5.0–8.0)

## 2014-07-24 LAB — CARBOXYHEMOGLOBIN
Carboxyhemoglobin: 1.5 % (ref 0.5–1.5)
Methemoglobin: 1.1 % (ref 0.0–1.5)
O2 Saturation: 72.1 %
Total hemoglobin: 9.7 g/dL — ABNORMAL LOW (ref 12.0–16.0)

## 2014-07-24 LAB — I-STAT CG4 LACTIC ACID, ED: LACTIC ACID, VENOUS: 1.73 mmol/L (ref 0.5–2.2)

## 2014-07-24 LAB — PRO B NATRIURETIC PEPTIDE: Pro B Natriuretic peptide (BNP): 6064 pg/mL — ABNORMAL HIGH (ref 0–450)

## 2014-07-24 LAB — PROTIME-INR
INR: 1.14 (ref 0.00–1.49)
PROTHROMBIN TIME: 14.6 s (ref 11.6–15.2)

## 2014-07-24 LAB — TROPONIN I: Troponin I: <0.3 ng/mL (ref <0.30)

## 2014-07-24 LAB — URINE MICROSCOPIC-ADD ON

## 2014-07-24 LAB — MRSA PCR SCREENING: MRSA by PCR: NEGATIVE

## 2014-07-24 LAB — TSH: TSH: 0.971 u[IU]/mL (ref 0.350–4.500)

## 2014-07-24 LAB — LACTIC ACID, PLASMA: LACTIC ACID, VENOUS: 4.3 mmol/L — AB (ref 0.5–2.2)

## 2014-07-24 MED ORDER — SODIUM CHLORIDE 0.9 % IV SOLN
INTRAVENOUS | Status: AC
  Administered 2014-07-24: 15:00:00 via INTRAVENOUS

## 2014-07-24 MED ORDER — SODIUM CHLORIDE 0.9 % IV SOLN
1250.0000 mg | INTRAVENOUS | Status: DC
  Administered 2014-07-25 – 2014-07-27 (×3): 1250 mg via INTRAVENOUS
  Filled 2014-07-24 (×3): qty 1250

## 2014-07-24 MED ORDER — INSULIN REGULAR HUMAN 100 UNIT/ML IJ SOLN
INTRAMUSCULAR | Status: DC
  Administered 2014-07-24: 7.5 [IU]/h via INTRAVENOUS
  Administered 2014-07-24: 10.8 [IU]/h via INTRAVENOUS
  Administered 2014-07-24: 5.4 [IU]/h via INTRAVENOUS
  Filled 2014-07-24: qty 2.5

## 2014-07-24 MED ORDER — ACETAMINOPHEN 650 MG RE SUPP: 650.0000 mg | Freq: Four times a day (QID) | RECTAL | Status: DC | PRN

## 2014-07-24 MED ORDER — SUCCINYLCHOLINE CHLORIDE 20 MG/ML IJ SOLN
INTRAMUSCULAR | Status: AC
  Filled 2014-07-24: qty 1

## 2014-07-24 MED ORDER — MIDAZOLAM HCL 2 MG/2ML IJ SOLN
INTRAMUSCULAR | Status: AC
  Administered 2014-07-24: 2 mg
  Filled 2014-07-24: qty 4

## 2014-07-24 MED ORDER — CETYLPYRIDINIUM CHLORIDE 0.05 % MT LIQD
7.0000 mL | Freq: Four times a day (QID) | OROMUCOSAL | Status: DC
  Administered 2014-07-25 – 2014-07-26 (×4): 7 mL via OROMUCOSAL

## 2014-07-24 MED ORDER — ONDANSETRON HCL 4 MG/2ML IJ SOLN: 4.0000 mg | Freq: Four times a day (QID) | INTRAMUSCULAR | Status: DC | PRN

## 2014-07-24 MED ORDER — METHYLPREDNISOLONE SODIUM SUCC 40 MG IJ SOLR
40.0000 mg | Freq: Every day | INTRAMUSCULAR | Status: DC
  Administered 2014-07-25: 40 mg via INTRAVENOUS
  Filled 2014-07-24 (×2): qty 1

## 2014-07-24 MED ORDER — CHLORHEXIDINE GLUCONATE 0.12 % MT SOLN
15.0000 mL | Freq: Two times a day (BID) | OROMUCOSAL | Status: DC
  Administered 2014-07-24: 15 mL via OROMUCOSAL

## 2014-07-24 MED ORDER — PANTOPRAZOLE SODIUM 40 MG IV SOLR
40.0000 mg | Freq: Every day | INTRAVENOUS | Status: DC
  Administered 2014-07-24: 40 mg via INTRAVENOUS
  Filled 2014-07-24 (×3): qty 40

## 2014-07-24 MED ORDER — LIDOCAINE HCL (CARDIAC) 20 MG/ML IV SOLN
INTRAVENOUS | Status: AC
  Filled 2014-07-24: qty 5

## 2014-07-24 MED ORDER — LEVOTHYROXINE SODIUM 100 MCG IV SOLR
25.0000 ug | Freq: Every day | INTRAVENOUS | Status: DC
  Administered 2014-07-24 – 2014-07-25 (×2): 25 ug via INTRAVENOUS
  Filled 2014-07-24 (×3): qty 5

## 2014-07-24 MED ORDER — HYDRALAZINE HCL 20 MG/ML IJ SOLN: 10.0000 mg | INTRAMUSCULAR | Status: DC | PRN

## 2014-07-24 MED ORDER — FENTANYL CITRATE 0.05 MG/ML IJ SOLN
50.0000 ug | INTRAMUSCULAR | Status: DC | PRN
  Filled 2014-07-24: qty 2

## 2014-07-24 MED ORDER — HYDRALAZINE HCL 20 MG/ML IJ SOLN
10.0000 mg | Freq: Once | INTRAMUSCULAR | Status: DC
  Filled 2014-07-24: qty 1

## 2014-07-24 MED ORDER — SODIUM CHLORIDE 0.9 % IV SOLN: INTRAVENOUS | Status: DC

## 2014-07-24 MED ORDER — ETOMIDATE 2 MG/ML IV SOLN
INTRAVENOUS | Status: AC
  Administered 2014-07-24: 10 mg
  Filled 2014-07-24: qty 20

## 2014-07-24 MED ORDER — SODIUM CHLORIDE 0.9 % IJ SOLN
3.0000 mL | Freq: Two times a day (BID) | INTRAMUSCULAR | Status: DC
  Administered 2014-07-24 – 2014-08-05 (×14): 3 mL via INTRAVENOUS

## 2014-07-24 MED ORDER — IPRATROPIUM-ALBUTEROL 0.5-2.5 (3) MG/3ML IN SOLN
3.0000 mL | RESPIRATORY_TRACT | Status: DC
  Administered 2014-07-24 – 2014-07-27 (×17): 3 mL via RESPIRATORY_TRACT
  Filled 2014-07-24 (×18): qty 3

## 2014-07-24 MED ORDER — ROCURONIUM BROMIDE 50 MG/5ML IV SOLN
INTRAVENOUS | Status: AC
  Administered 2014-07-24: 10 mg
  Filled 2014-07-24: qty 2

## 2014-07-24 MED ORDER — ACETAMINOPHEN 325 MG PO TABS
650.0000 mg | ORAL_TABLET | Freq: Four times a day (QID) | ORAL | Status: DC | PRN
  Administered 2014-08-02: 650 mg via ORAL
  Filled 2014-07-24 (×2): qty 2

## 2014-07-24 MED ORDER — DEXTROSE-NACL 5-0.45 % IV SOLN
INTRAVENOUS | Status: DC
  Administered 2014-07-24 – 2014-07-25 (×2): via INTRAVENOUS

## 2014-07-24 MED ORDER — CHLORHEXIDINE GLUCONATE 0.12 % MT SOLN
15.0000 mL | Freq: Two times a day (BID) | OROMUCOSAL | Status: DC
  Administered 2014-07-25 – 2014-07-27 (×6): 15 mL via OROMUCOSAL
  Filled 2014-07-24 (×7): qty 15

## 2014-07-24 MED ORDER — FUROSEMIDE 10 MG/ML IJ SOLN
40.0000 mg | Freq: Once | INTRAMUSCULAR | Status: AC
Start: 2014-07-24 — End: 2014-07-24
  Administered 2014-07-24: 40 mg via INTRAVENOUS
  Filled 2014-07-24: qty 4

## 2014-07-24 MED ORDER — HEPARIN SODIUM (PORCINE) 5000 UNIT/ML IJ SOLN
5000.0000 [IU] | Freq: Three times a day (TID) | INTRAMUSCULAR | Status: DC
  Administered 2014-07-24 – 2014-08-05 (×36): 5000 [IU] via SUBCUTANEOUS
  Filled 2014-07-24 (×39): qty 1

## 2014-07-24 MED ORDER — METOPROLOL TARTRATE 1 MG/ML IV SOLN
5.0000 mg | Freq: Four times a day (QID) | INTRAVENOUS | Status: DC
  Administered 2014-07-24: 5 mg via INTRAVENOUS
  Filled 2014-07-24: qty 5

## 2014-07-24 MED ORDER — INSULIN REGULAR HUMAN 100 UNIT/ML IJ SOLN
INTRAMUSCULAR | Status: DC
  Administered 2014-07-24: 16.2 [IU]/h via INTRAVENOUS
  Filled 2014-07-24: qty 2.5

## 2014-07-24 MED ORDER — SODIUM CHLORIDE 0.9 % IV BOLUS (SEPSIS): 750.0000 mL | Freq: Once | INTRAVENOUS | Status: DC

## 2014-07-24 MED ORDER — SODIUM CHLORIDE 0.9 % IV SOLN
INTRAVENOUS | Status: DC
  Administered 2014-07-24: 19:00:00 via INTRAVENOUS

## 2014-07-24 MED ORDER — DEXTROSE-NACL 5-0.45 % IV SOLN
INTRAVENOUS | Status: DC
Start: 2014-07-24 — End: 2014-07-24

## 2014-07-24 MED ORDER — CHLORHEXIDINE GLUCONATE 0.12 % MT SOLN: 15.0000 mL | Freq: Two times a day (BID) | OROMUCOSAL | Status: DC

## 2014-07-24 MED ORDER — PANTOPRAZOLE SODIUM 40 MG IV SOLR: 40.0000 mg | Freq: Every day | INTRAVENOUS | Status: DC

## 2014-07-24 MED ORDER — METHYLPREDNISOLONE SODIUM SUCC 40 MG IJ SOLR
40.0000 mg | Freq: Two times a day (BID) | INTRAMUSCULAR | Status: DC
  Administered 2014-07-24: 40 mg via INTRAVENOUS
  Filled 2014-07-24 (×2): qty 1

## 2014-07-24 MED ORDER — IPRATROPIUM-ALBUTEROL 0.5-2.5 (3) MG/3ML IN SOLN: 3.0000 mL | RESPIRATORY_TRACT | Status: DC | PRN

## 2014-07-24 MED ORDER — CETYLPYRIDINIUM CHLORIDE 0.05 % MT LIQD: 7.0000 mL | Freq: Four times a day (QID) | OROMUCOSAL | Status: DC

## 2014-07-24 MED ORDER — DEXTROSE 5 % IV SOLN
2.0000 g | Freq: Once | INTRAVENOUS | Status: AC
  Administered 2014-07-24: 2 g via INTRAVENOUS
  Filled 2014-07-24: qty 2

## 2014-07-24 MED ORDER — CETYLPYRIDINIUM CHLORIDE 0.05 % MT LIQD
7.0000 mL | Freq: Four times a day (QID) | OROMUCOSAL | Status: DC
  Administered 2014-07-25 – 2014-07-28 (×12): 7 mL via OROMUCOSAL

## 2014-07-24 MED ORDER — POTASSIUM CHLORIDE 10 MEQ/100ML IV SOLN
10.0000 meq | INTRAVENOUS | Status: AC
  Administered 2014-07-24: 10 meq via INTRAVENOUS
  Filled 2014-07-24: qty 100

## 2014-07-24 MED ORDER — FENTANYL CITRATE 0.05 MG/ML IJ SOLN
INTRAMUSCULAR | Status: AC
  Administered 2014-07-24: 100 ug
  Filled 2014-07-24: qty 2

## 2014-07-24 MED ORDER — FENTANYL CITRATE 0.05 MG/ML IJ SOLN
50.0000 ug | INTRAMUSCULAR | Status: DC | PRN
  Administered 2014-07-24: 50 ug via INTRAVENOUS
  Administered 2014-07-25: 25 ug via INTRAVENOUS
  Administered 2014-07-25: 50 ug via INTRAVENOUS
  Filled 2014-07-24 (×2): qty 2

## 2014-07-24 MED ORDER — DEXTROSE 5 % IV SOLN
1.0000 g | Freq: Three times a day (TID) | INTRAVENOUS | Status: AC
  Administered 2014-07-25 – 2014-07-30 (×18): 1 g via INTRAVENOUS
  Filled 2014-07-24 (×19): qty 1

## 2014-07-24 MED ORDER — DEXTROSE 50 % IV SOLN: 25.0000 mL | INTRAVENOUS | Status: DC | PRN

## 2014-07-24 MED ORDER — ONDANSETRON HCL 4 MG PO TABS: 4.0000 mg | ORAL_TABLET | Freq: Four times a day (QID) | ORAL | Status: DC | PRN

## 2014-07-24 MED ORDER — VANCOMYCIN HCL IN DEXTROSE 1-5 GM/200ML-% IV SOLN
1000.0000 mg | Freq: Once | INTRAVENOUS | Status: AC
  Administered 2014-07-24: 1000 mg via INTRAVENOUS
  Filled 2014-07-24: qty 200

## 2014-07-24 NOTE — Progress Notes (Signed)
ANTIBIOTIC CONSULT NOTE - INITIAL  Pharmacy Consult for vancomycin and aztreonam Indication: HCAP  Allergies  Allergen Reactions  . Erythromycin Shortness Of Breath  . Iodine Hives  . Penicillins Hives    Patient Measurements: Height:  (160 cm) Weight: 180 lb (81.647 kg) IBW/kg (Calculated) : 52.4   Vital Signs: BP: 142/108 mmHg (09/18 1100) Pulse Rate: 80 (09/18 1100) Intake/Output from previous day:   Intake/Output from this shift:    Labs:  Recent Labs  07/24/14 1000  WBC 9.1  HGB 10.5*  PLT 237  CREATININE 1.44*   Estimated Creatinine Clearance: 34.2 ml/min (by C-G formula based on Cr of 1.44). No results found for this basename: VANCOTROUGH, VANCOPEAK, VANCORANDOM, GENTTROUGH, GENTPEAK, GENTRANDOM, TOBRATROUGH, TOBRAPEAK, TOBRARND, AMIKACINPEAK, AMIKACINTROU, AMIKACIN,  in the last 72 hours   Microbiology: No results found for this or any previous visit (from the past 720 hour(s)).  Medical History: Past Medical History  Diagnosis Date  . Hypertension   . Diabetes mellitus   . Chronic renal insufficiency   . Hyperlipidemia   . Osteopenia   . CVA (cerebral infarction)      Assessment: 73 yo F to start vancomycin and aztreonam per pharmacy for HCAP.  Brought to ED because of decreased responsiveness, difficulty breathing.  Wt 81.6 kg, WBC 9.1, HR 80, RR 26. Creat 1.44, creat cl ~ 34 ml/min.  Goal of Therapy:  Vancomycin trough level 15-20 mcg/ml  Plan:  1. Aztreonam 2 gm x 1 dose then 1 gm IV q8h 2. Vancomycin 1 gm x 1 then vanc 1250 mg IV q24 3. F/u renal fxn, wbc, temp, culture data, clinical course 4. Check steady-state vancomycin trough as needed  Herby Abraham, Pharm.D. [phone] 07/24/2014 11:22 AM

## 2014-07-24 NOTE — Progress Notes (Signed)
eLink Physician-Brief Progress Note Patient Name: [name] [name] DOB: [birth year] MRN: [id number]   Date of Service  07/24/2014  HPI/Events of Note  PT with acute resp failure and need for intubation  eICU Interventions  See orders Intubation to be done tfr to icu     Intervention Category Major Interventions: Respiratory failure - evaluation and management  [name] 07/24/2014, 5:11 PM

## 2014-07-24 NOTE — H&P (Signed)
[facility] History and Physical  [name] [name] UJW:[id number] DOB: [birth year] DOA: 07/24/2014  Referring physician: Emergency Department PCP: Alva Garnet., MD  Specialists:   Chief Complaint: SOB  HPI: [name] [name] is a 73 y.o. female  With a hx of HTN, copd, cva, DM CKD who presents from home after being found to be in acute respiratory distress. Please note, pt is on BiPAP and cannot provide history. Husband is at bedside but is a very poor historian. Per report, pt was in her usual state of health until the AM of admission when pt was noted to have increased respiratory effort at home. Pt normally uses "puffers" however she had run out and no longer has any at home. Pt was brought to the ED where a cxr was notable for a LUL pna. Pt was started on empiric abx. Also, pt was noted to have glucose over 800's. Insulin gtt was started. An abg was notable for pH of 7.25 with PCO2 of 72. Pt was cont on BiPAP with repeat pH of 7.29 and PCO2 of 63. Hospitalist service was consulted for continued care of pt.  Review of Systems: Unable to obtain from pt given her current mental state  Past Medical History  Diagnosis Date  . Hypertension   . Diabetes mellitus   . Chronic renal insufficiency   . Hyperlipidemia   . Osteopenia   . CVA (cerebral infarction)    Past Surgical History  Procedure Laterality Date  . Eye surgery    . Loop recorder implant  03-24-2014    MDT LinQ implanted by Dr Johney Frame for cryptogenic stroke  . Tee without cardioversion N/A 03/23/2014    Procedure: TRANSESOPHAGEAL ECHOCARDIOGRAM (TEE);  Surgeon: Lars Masson, MD;  Location: [facility] ENDOSCOPY;  Service: Cardiovascular;  Laterality: N/A;   Social History:  reports that she quit smoking about 6 weeks ago. Her smoking use included Cigarettes. She has a 30 pack-year smoking history. She has never used smokeless tobacco. She reports that she drinks alcohol. She reports that she does not use illicit drugs.  where  does patient live--home, ALF, SNF? and with whom if at home?  Can patient participate in ADLs?  Allergies  Allergen Reactions  . Erythromycin Shortness Of Breath  . Iodine Hives  . Penicillins Hives    Family History  Problem Relation Age of Onset  . Diabetes Mother   . Hypertension Mother     (be sure to complete)  Prior to Admission medications   Medication Sig Start Date End Date Taking? Authorizing Provider  acetaminophen (TYLENOL) 500 MG tablet Take 2 tablets (1,000 mg total) by mouth 3 (three) times daily as needed for mild pain, moderate pain, fever or headache. 06/18/14   Renae Fickle, MD  albuterol (PROVENTIL) (2.5 MG/3ML) 0.083% nebulizer solution Take 3 mLs (2.5 mg total) by nebulization every 4 (four) hours as needed for wheezing or shortness of breath. 06/19/14   Renae Fickle, MD  atorvastatin (LIPITOR) 40 MG tablet Take 40 mg by mouth daily.    Historical Provider, MD  benzonatate (TESSALON) 200 MG capsule Take 200 mg by mouth 3 (three) times daily as needed for cough.    Historical Provider, MD  bimatoprost (LUMIGAN) 0.01 % SOLN Place 1 drop into both eyes at bedtime. 03/24/14   Shanker Levora Dredge, MD  budesonide-formoterol (SYMBICORT) 80-4.5 MCG/ACT inhaler Inhale 2 puffs into the lungs 2 (two) times daily. 06/18/14   Renae Fickle, MD  clopidogrel (PLAVIX) 75 MG tablet Take 1  tablet (75 mg total) by mouth daily with breakfast. 03/24/14   Maretta Bees, MD  furosemide (LASIX) 20 MG tablet Take 20 mg by mouth daily.  04/03/14   Historical Provider, MD  gabapentin (NEURONTIN) 300 MG capsule Take 1 capsule (300 mg total) by mouth 2 (two) times daily. 03/24/14   Shanker Levora Dredge, MD  hydrALAZINE (APRESOLINE) 25 MG tablet Take 25 mg by mouth 3 (three) times daily. Take with a 50 mg tablet for a 75 mg dose    Historical Provider, MD  hydrALAZINE (APRESOLINE) 50 MG tablet Take 50 mg by mouth 3 (three) times daily. Take with a 25 mg tablet for a 75 mg dose    Historical  Provider, MD  insulin NPH-regular Human (HUMULIN 70/30) (70-30) 100 UNIT/ML injection Inject 10 Units into the skin 2 (two) times daily with a meal. 06/18/14   Renae Fickle, MD  levothyroxine (SYNTHROID, LEVOTHROID) 50 MCG tablet Take 1 tablet (50 mcg total) by mouth daily. 03/24/14   Shanker Levora Dredge, MD  metoprolol (LOPRESSOR) 50 MG tablet Take 1 tablet (50 mg total) by mouth 2 (two) times daily. 06/18/14   Renae Fickle, MD  nystatin (MYCOSTATIN) 100000 UNIT/ML suspension Take 5 mLs (500,000 Units total) by mouth 4 (four) times daily. 06/19/14   Renae Fickle, MD  predniSONE (DELTASONE) 10 MG tablet 2 tabs daily x 2 days, 1 tab daily x 2 days, half tab daily x 2 days, then stop 06/18/14   Renae Fickle, MD   Physical Exam: Filed Vitals:   07/24/14 1330 07/24/14 1345 07/24/14 1400 07/24/14 1415  BP: 146/64 142/76 115/89 119/87  Pulse: 76 79 80 82  Temp:      TempSrc:      Resp: Height:      Weight:      SpO2: 100% 100% 100% 100%     General:  Awake, on bipap, in nad  Eyes: PERRL B  ENT: membranes moist, dentition fair  Neck: trachea midline, neck supple  Cardiovascular: regular, s1, s2  Respiratory: increased resp effort, coarse, no definite wheezing  Abdomen: soft, obese, nondistended  Skin: normal skin turgor, no abnormal skin lesions seen  Musculoskeletal: perfused, no clubbing, mild nonpitting LE edema  Psychiatric: mood/affect normal// no auditory/visual hallucinations  Neurologic: cn2-12 grossly intact, strength/sensation intact  Labs on Admission:  Basic Metabolic Panel:  Recent Labs Lab 07/24/14 1000  NA 143  K 5.0  CL 96  CO2 28  GLUCOSE 802*  BUN 34*  CREATININE 1.44*  CALCIUM 9.2   Liver Function Tests:  Recent Labs Lab 07/24/14 1000  AST 17  ALT 18  ALKPHOS 91  BILITOT 0.3  PROT 6.8  ALBUMIN 3.3*   No results found for this basename: LIPASE, AMYLASE,  in the last 168 hours No results found for this basename:  AMMONIA,  in the last 168 hours CBC:  Recent Labs Lab 07/24/14 1000  WBC 9.1  NEUTROABS 8.1*  HGB 10.5*  HCT 33.3*  MCV 93.5  PLT 237   Cardiac Enzymes:  Recent Labs Lab 07/24/14 1000  TROPONINI <0.30    BNP (last 3 results)  Recent Labs  06/09/14 1344 06/12/14 1330 07/24/14 1000  PROBNP 1271.0* 1666.0* 6064.0*   CBG:  Recent Labs Lab 07/24/14 0909 07/24/14 1306 07/24/14 1411  GLUCAP >600* >600* >600*    Radiological Exams on Admission: Ct Head Wo Contrast  07/24/2014   CLINICAL DATA:  Elevated blood glucose, hypertensive, mental status change  EXAM: CT HEAD WITHOUT CONTRAST  TECHNIQUE: Contiguous axial images were obtained from the base of the skull through the vertex without intravenous contrast.  COMPARISON:  06/12/2014  FINDINGS: There is no evidence of mass effect, midline shift, or extra-axial fluid collections. There is no evidence of a space-occupying lesion or intracranial hemorrhage. There is no evidence of a cortical-based area of acute infarction. There is an old right basal ganglia and bilateral thalamic lacunar infarct. There is generalized cerebral atrophy. There is periventricular white matter low attenuation likely secondary to microangiopathy.  The ventricles and sulci are appropriate for the patient's age. The basal cisterns are patent.  Visualized portions of the orbits are unremarkable. The visualized portions of the paranasal sinuses and mastoid air cells are unremarkable. Cerebrovascular atherosclerotic calcifications are noted.  The osseous structures are unremarkable.  IMPRESSION: No acute intracranial pathology.   Electronically Signed   By: Elige Ko   On: 07/24/2014 11:06   Dg Chest Port 1 View  07/24/2014   CLINICAL DATA:  Recent aspiration  EXAM: PORTABLE CHEST - 1 VIEW  COMPARISON:  06/13/2014  FINDINGS: The cardiac shadow is stable. Monitoring device is again seen. The right lung is clear. The left lung demonstrates patchy infiltrate in  the upper lobe which may be related to the patient's recent aspiration.  IMPRESSION: Left upper lobe infiltrate. Followup films to resolution are recommended.   Electronically Signed   By: Alcide Clever M.D.   On: 07/24/2014 11:09   EKG: Independently reviewed. Motion artifacts, appears NSR  Assessment/Plan Principal Problem:   Acute respiratory failure with hypoxia and hypercarbia Active Problems:   History of CVA (cerebrovascular accident)   Hypertension   DM type 2, uncontrolled, with renal complications   CKD (chronic kidney disease), stage III   Encephalopathy acute   Respiratory failure   COPD (chronic obstructive pulmonary disease)  1. Acute hypercarbic respiratory failure 1. Cont bipap as tolerated 2. Follow serial abg's 3. Cont on scheduled duonebs and solumedrol 4. Admit to stepdown 2. DM with HONK 1. Will cont on IVF hydration as tolerated 2. Cont on insulin gtt 3. CAP 1. Not septic 2. Cont abx already started in ED 4. HTN 1. Poorly controlled 2. Cont on scheduled IV lopressor 3. Will cont on PRN hydralazine 5. Acute on CKD 3 1. Baseline Cr of 1.06 in 06/2014 2. Cr today 1.44 3. Hydration per above 6. COPD 1. Per above, cont on scheduled nebs and IV steroids 7. Acute encephalopathy 1. Likely secondary to hypercarbia 2. Bipap per above 8. DVT prophylaxis 1. Heparin subq 9. Hypothyroid 1. Cont on IV dosed thyroid replacment 2. Will check TSH  Code Status: Full (must indicate code status--if unknown or must be presumed, indicate so) Family Communication: Pt and husband in room (indicate person spoken with, if applicable, with phone number if by telephone) Disposition Plan: Pending (indicate anticipated LOS)  Time spent:  Dreyson Mishkin K [facility] Pager [phone]  If 7PM-7AM, please contact night-coverage [email or web address] Password [facility] 07/24/2014, 2:27 PM

## 2014-07-24 NOTE — Progress Notes (Addendum)
Critical ABG values reported to MD.  Ordered to place patient on BiPAP.  Per MD, obtain ABG in 45 minutes.  RT will continue to monitor.

## 2014-07-24 NOTE — ED Notes (Signed)
Lab results given to Dr.Pollina. 

## 2014-07-24 NOTE — ED Notes (Signed)
EDMD at bedside

## 2014-07-24 NOTE — Procedures (Signed)
Intubation Procedure Note [name] [name] [id number] Nov 17, 1940  Procedure: Intubation Indications: Respiratory insufficiency  Procedure Details Consent: Unable to obtain consent because of emergent medical necessity. Time Out: Verified patient identification, verified procedure, site/side was marked, verified correct patient position, special equipment/implants available, medications/allergies/relevent history reviewed, required imaging and test results available.  Performed  Maximum sterile technique was used including antiseptics, gloves, hand hygiene and mask.  MAC    Evaluation Hemodynamic Status: BP stable throughout; O2 sats: stable throughout Patient's Current Condition: stable Complications: No apparent complications Patient did tolerate procedure well. Chest X-ray ordered to verify placement.  CXR: pending.   Koren Bound 07/24/2014

## 2014-07-24 NOTE — ED Notes (Signed)
Held hydralazine for BP, MD aware

## 2014-07-24 NOTE — Progress Notes (Signed)
Pt arrived to floor nonresponsive.  Dr. Delford Field with [facility] notified. Dr. Molli Knock coming to floor to intubate pt.

## 2014-07-24 NOTE — ED Notes (Signed)
Glucose stabilizer stated to increase insulin rate to 19.5 which is above guardrails, kept at current rate.

## 2014-07-24 NOTE — ED Notes (Signed)
Attempted to give report, bed has not been assigned to nurse yet.

## 2014-07-24 NOTE — ED Notes (Signed)
Waiting for RT to take pt upstairs.

## 2014-07-24 NOTE — ED Notes (Signed)
Talked to flow manager, no step down beds available, states pt will be here for a while

## 2014-07-24 NOTE — ED Notes (Signed)
Pt comes from via [facility], called by home health nurse for breathing issues, per EMS glucose meter reads high and pt is hypertensive.

## 2014-07-24 NOTE — Consult Note (Addendum)
PULMONARY / CRITICAL CARE MEDICINE   Name: [name] [name] MRN: [id number] DOB: 01-Dec-1940    ADMISSION DATE:  07/24/2014 CONSULTATION DATE:  07/24/2014  REFERRING MD :  Rhona Leavens  CHIEF COMPLAINT:  Acute respiratory distress  INITIAL PRESENTATION: [name] with COPD, CVA, CK D., Diabetes admitted 9/18 with acute respiratory distress , hypercarbic respiratory failure, DKA with glucose of 802 and anion gap of 19 , requiring mechanical ventilation. Chest x-ray showed left upper lobe infiltrate. 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 73 year old with COPD, CVA, CK D., Diabetes admitted 9/18 with acute respiratory distress , hypercarbic respiratory failure, DKA with glucose of 802 and anion gap of 19 , requiring mechanical ventilation. Chest x-ray showed left upper lobe infiltrate. Was placed on BiPAP initially, but unresponsive on arrival to the ICU, hence intubated She seems to be a smoker but more than 30-pack-year history, quit 6 weeks ago She was a resident of [facility] due to CVA and discharge on 9/14. She has diabetes-2, non-insulin-dependent with C KD stage 3 She had recent admit from 8/to 814 for hypercarbic failure that improves with BiPAP  PAST MEDICAL HISTORY :  Past Medical History  Diagnosis Date  . Hypertension   . Diabetes mellitus   . Chronic renal insufficiency   . Hyperlipidemia   . Osteopenia   . CVA (cerebral infarction)   . Thyroid disease    Past Surgical History  Procedure Laterality Date  . Eye surgery    . Loop recorder implant  03-24-2014    MDT LinQ implanted by Dr  Johney Frame for cryptogenic stroke  . Tee without cardioversion N/A 03/23/2014    Procedure: TRANSESOPHAGEAL ECHOCARDIOGRAM (TEE);  Surgeon: Lars Masson, MD;  Location: [facility] ENDOSCOPY;  Service: Cardiovascular;  Laterality: N/A;   Prior to Admission medications   Medication Sig Start Date End Date Taking? Authorizing Provider  acetaminophen (TYLENOL) 500 MG tablet Take 2 tablets (1,000 mg total) by mouth 3 (three) times daily as needed for mild pain, moderate pain, fever or headache. 06/18/14   Renae Fickle, MD  albuterol (PROVENTIL) (2.5 MG/3ML) 0.083% nebulizer solution Take 3 mLs (2.5 mg total) by nebulization every 4 (four) hours as needed for wheezing or shortness of breath. 06/19/14   Renae Fickle, MD  atorvastatin (LIPITOR) 40 MG tablet Take 40 mg by mouth daily.    Historical Provider, MD  benzonatate (TESSALON) 200 MG capsule Take 200 mg by mouth 3 (three) times daily as needed for cough.    Historical Provider, MD  bimatoprost (LUMIGAN) 0.01 % SOLN Place 1 drop into both eyes at bedtime. 03/24/14   Shanker Levora Dredge, MD  budesonide-formoterol (SYMBICORT) 80-4.5 MCG/ACT inhaler Inhale 2 puffs into the lungs 2 (two) times daily. 06/18/14   Renae Fickle, MD  clopidogrel (PLAVIX) 75 MG tablet Take 1 tablet (75 mg total) by mouth daily with breakfast. 03/24/14   Maretta Bees, MD  furosemide (LASIX) 20 MG tablet Take 20 mg by mouth daily.  04/03/14   Historical Provider, MD  gabapentin (NEURONTIN) 300 MG capsule Take 1 capsule (300 mg total) by mouth 2 (two) times daily.  03/24/14   Shanker Levora Dredge, MD  hydrALAZINE (APRESOLINE) 25 MG tablet Take 25 mg by mouth 3 (three) times daily. Take with a 50 mg tablet for a 75 mg dose    Historical Provider, MD  hydrALAZINE (APRESOLINE) 50 MG tablet Take 50 mg by mouth 3 (three) times daily. Take with a 25 mg tablet for a 75 mg dose    Historical Provider, MD  insulin NPH-regular Human (HUMULIN 70/30) (70-30) 100 UNIT/ML injection Inject 10 Units  into the skin 2 (two) times daily with a meal. 06/18/14   Renae Fickle, MD  levothyroxine (SYNTHROID, LEVOTHROID) 50 MCG tablet Take 1 tablet (50 mcg total) by mouth daily. 03/24/14   Shanker Levora Dredge, MD  metoprolol (LOPRESSOR) 50 MG tablet Take 1 tablet (50 mg total) by mouth 2 (two) times daily. 06/18/14   Renae Fickle, MD  nystatin (MYCOSTATIN) 100000 UNIT/ML suspension Take 5 mLs (500,000 Units total) by mouth 4 (four) times daily. 06/19/14   Renae Fickle, MD  predniSONE (DELTASONE) 10 MG tablet 2 tabs daily x 2 days, 1 tab daily x 2 days, half tab daily x 2 days, then stop 06/18/14   Renae Fickle, MD   Allergies  Allergen Reactions  . Erythromycin Shortness Of Breath  . Iodine Hives  . Penicillins Hives    FAMILY HISTORY:  Family History  Problem Relation Age of Onset  . Diabetes Mother   . Hypertension Mother    SOCIAL HISTORY:  reports that she quit smoking about 6 weeks ago. Her smoking use included Cigarettes. She has a 30 pack-year smoking history. She has never used smokeless tobacco. She reports that she drinks alcohol. She reports that she does not use illicit drugs.  REVIEW OF SYSTEMS:  Unable to obtain  SUBJECTIVE:   VITAL SIGNS: Temp:  [98 F (36.7 C)] 98 F (36.7 C) (09/18 1317) Pulse Rate:  [65-93] 65 (09/18 1545) Resp:  [14-29] 16 (09/18 1732) BP: (115-219)/(39-120) 134/39 mmHg (09/18 1732) SpO2:  [100 %] 100 % (09/18 1545) FiO2 (%):  [40 %-50 %] 40 % (09/18 1732) Weight:  [81.647 kg (180 lb)] 81.647 kg (180 lb) (09/18 0913) HEMODYNAMICS:   VENTILATOR SETTINGS: Vent Mode:  [-] PRVC FiO2 (%):  [40 %-50 %] 40 % Set Rate:  [12 bmp-16 bmp] 16 bmp Vt Set:  [500 mL] 500 mL Plateau Pressure:  [20 cmH20] 20 cmH20 INTAKE / OUTPUT:  Intake/Output Summary (Last 24 hours) at 07/24/14 1752 Last data filed at 07/24/14 1250  Gross per 24 hour  Intake      0 ml  Output    100 ml  Net   -100 ml    PHYSICAL EXAMINATION: General:  Appears chronically  ill, sedated, orally intubated Neuro:  Unresponsive, sedated HEENT:  JVD 4 cm, pupils the millimeters reactive to light Cardiovascular:  S1-S2 normal, no murmur Lungs:  Decreased breath sounds bilateral, faint rhonchi, peak pressure 33 Abdomen:  Soft distended nontender Musculoskeletal:  No joint swelling Skin:  No skin rash  LABS:  CBC  Recent Labs Lab 07/24/14 1000  WBC 9.1  HGB 10.5*  HCT 33.3*  PLT 237   Coag's  Recent Labs Lab 07/24/14 1000  INR 1.14   BMET  Recent Labs Lab 07/24/14 1000  NA 143  K 5.0  CL 96  CO2 28  BUN 34*  CREATININE 1.44*  GLUCOSE 802*   Electrolytes  Recent Labs Lab 07/24/14 1000  CALCIUM 9.2   Sepsis Markers  Recent Labs Lab  07/24/14 1022  LATICACIDVEN 1.73   ABG  Recent Labs Lab 07/24/14 1033 07/24/14 1307  PHART 7.251* 7.291*  PCO2ART 72.7* 63.5*  PO2ART 57.0* 175.0*   Liver Enzymes  Recent Labs Lab 07/24/14 1000  AST 17  ALT 18  ALKPHOS 91  BILITOT 0.3  ALBUMIN 3.3*   Cardiac Enzymes  Recent Labs Lab 07/24/14 1000  TROPONINI <0.30  PROBNP 6064.0*   Glucose  Recent Labs Lab 07/24/14 0909 07/24/14 1306 07/24/14 1411 07/24/14 1524 07/24/14 1630  GLUCAP >600* >600* >600* >600* 548*    Imaging No results found.   ASSESSMENT / PLAN:  PULMONARY OETT 9/18>> A: Acute hypercarbic respiratory failure COPD Left upper lobe HCAP - note recent discharge from nursing home P:   PR VC, keep respiratory rate 18 -watch for auto PEEP if rate high Chest x-ray and ABG Decrease Solu-Medrol to every 24 hours given hyperglycemia DuoNeb's  CARDIOVASCULAR CVL A: Hypotension -related to positive pressure ventilation and meds TEE 03/2014- normal LV function Atrial flutter and prolonged QT on EKG P:  Normal saline bolus 1 L Insert central line Expect blood pressure to improve-may need pressors Check troponins given acute event, note first at 10 AM was negative, lactate normal DC metoprolol and  hydralazine  RENAL A:  AK I on CKD stage III Appears volume overloaded P:   Avoid nephrotoxins  GASTROINTESTINAL A:  No issues P:   Insert OG-tube Protonix  HEMATOLOGIC A:  No issues P:  Septic heparin for DVT prophylaxis  INFECTIOUS A:  LUL HCAP, allergic to penicillin P:   BCx2 9/18 >> UC 9/18 >> Sputum9/18 >> Abx: Aztreonam, start date= 9/18 Vancomycin 9/18  ENDOCRINE A:  DKA versus hyperosmolar, increased anion gap favors DKA   P:   DKA protocol, IV insulin, and dextrose to fluids and CBG less than 250  NEUROLOGIC A:  Acute encephalopathy, related to hypercarbia and infection P:   RASS goal: 0 to -1 Fentanyl when necessary Use fentanyl gtt if needed  TODAY'S SUMMARY: A cute hypercarbic respiratory failure requiring mechanical ventilation in this 73 year old woman with COPD and recent CVA and left upper lobe HCAP  I have personally obtained a history, examined the patient, evaluated laboratory and imaging results, formulated the assessment and plan and placed orders. CRITICAL CARE: The patient is critically ill with multiple organ systems failure and requires high complexity decision making for assessment and support, frequent evaluation and titration of therapies, application of advanced monitoring technologies and extensive interpretation of multiple databases. Critical Care Time devoted to patient care services described in this note is 60 minutes.    Cyril Mourning MD. Tonny Bollman.  Pulmonary & Critical care Pager [phone] If no response call [phone]    07/24/2014, 5:52 PM

## 2014-07-24 NOTE — ED Notes (Signed)
Dr. Blinda Leatherwood would like to hold off fluids and glucose stabilizer until all labs and results are back.

## 2014-07-24 NOTE — Procedures (Signed)
Central Venous Catheter Insertion Procedure Note [name] [name] [id number] 1941/08/27  Procedure: Insertion of Central Venous Catheter Indications: Assessment of intravascular volume, Drug and/or fluid administration and Frequent blood sampling  Procedure Details Consent: Risks of procedure as well as the alternatives and risks of each were explained to the (patient/caregiver).  Consent for procedure obtained. Time Out: Verified patient identification, verified procedure, site/side was marked, verified correct patient position, special equipment/implants available, medications/allergies/relevent history reviewed, required imaging and test results available.  Performed  Maximum sterile technique was used including antiseptics, cap, gloves, gown, hand hygiene, mask and sheet. Skin prep: Chlorhexidine; local anesthetic administered A antimicrobial bonded/coated triple lumen catheter was placed in the left internal jugular vein using the Seldinger technique.  Evaluation Blood flow good Complications: No apparent complications Patient did tolerate procedure well. Chest X-ray ordered to verify placement.  CXR: pending.  ALVA,RAKESH V. 07/24/2014, 6:54 PM

## 2014-07-24 NOTE — ED Notes (Signed)
Attempted to give report to floor 

## 2014-07-24 NOTE — Progress Notes (Signed)
Patient transported to 2H22 without any complications.

## 2014-07-24 NOTE — Plan of Care (Signed)
Problem: Phase I Progression Outcomes Goal: GIProphysixis Outcome: Completed/Met Date Met:  07/24/14

## 2014-07-24 NOTE — ED Provider Notes (Signed)
CSN: [id number]     Arrival date & time 07/24/14  0900 History   First MD Initiated Contact with Patient 07/24/14 [phone]     Chief Complaint  Patient presents with  . Hyperglycemia     (Consider location/radiation/quality/duration/timing/severity/associated sxs/prior Treatment) HPI Comments: Patient brought to the ER for evaluation of decreased responsiveness. Visiting nurse came this morning and found the patient experiencing difficulty breathing and not responding like normal. Permission provided by EMS. Patient cannot answer questions because of her current condition. Family member that is with her also cannot answer any questions.Level V Caveat due to condition.   Patient is a 73 y.o. female presenting with hyperglycemia.  Hyperglycemia   Past Medical History  Diagnosis Date  . Hypertension   . Diabetes mellitus   . Chronic renal insufficiency   . Hyperlipidemia   . Osteopenia   . CVA (cerebral infarction)    Past Surgical History  Procedure Laterality Date  . Eye surgery    . Loop recorder implant  03-24-2014    MDT LinQ implanted by Dr Johney Frame for cryptogenic stroke  . Tee without cardioversion N/A 03/23/2014    Procedure: TRANSESOPHAGEAL ECHOCARDIOGRAM (TEE);  Surgeon: Lars Masson, MD;  Location: [facility] ENDOSCOPY;  Service: Cardiovascular;  Laterality: N/A;   Family History  Problem Relation Age of Onset  . Diabetes Mother   . Hypertension Mother    History  Substance Use Topics  . Smoking status: Former Smoker -- 0.50 packs/day for 60 years    Types: Cigarettes    Quit date: 06/09/2014  . Smokeless tobacco: Never Used     Comment: pt states "sometimes I quit smoking for about 2 weeks then I start back"  . Alcohol Use: Yes     Comment: occasionally   OB History   Grav Para Term Preterm Abortions TAB SAB Ect Mult Living                 Review of Systems  Unable to perform ROS: Acuity of condition      Allergies  Erythromycin; Iodine; and  Penicillins  Home Medications   Prior to Admission medications   Medication Sig Start Date End Date Taking? Authorizing Provider  acetaminophen (TYLENOL) 500 MG tablet Take 2 tablets (1,000 mg total) by mouth 3 (three) times daily as needed for mild pain, moderate pain, fever or headache. 06/18/14   Renae Fickle, MD  albuterol (PROVENTIL) (2.5 MG/3ML) 0.083% nebulizer solution Take 3 mLs (2.5 mg total) by nebulization every 4 (four) hours as needed for wheezing or shortness of breath. 06/19/14   Renae Fickle, MD  atorvastatin (LIPITOR) 40 MG tablet Take 40 mg by mouth daily.    Historical Provider, MD  benzonatate (TESSALON) 200 MG capsule Take 200 mg by mouth 3 (three) times daily as needed for cough.    Historical Provider, MD  bimatoprost (LUMIGAN) 0.01 % SOLN Place 1 drop into both eyes at bedtime. 03/24/14   Shanker Levora Dredge, MD  budesonide-formoterol (SYMBICORT) 80-4.5 MCG/ACT inhaler Inhale 2 puffs into the lungs 2 (two) times daily. 06/18/14   Renae Fickle, MD  clopidogrel (PLAVIX) 75 MG tablet Take 1 tablet (75 mg total) by mouth daily with breakfast. 03/24/14   Maretta Bees, MD  furosemide (LASIX) 20 MG tablet Take 20 mg by mouth daily.  04/03/14   Historical Provider, MD  gabapentin (NEURONTIN) 300 MG capsule Take 1 capsule (300 mg total) by mouth 2 (two) times daily. 03/24/14   Shanker Levora Dredge, MD  hydrALAZINE (APRESOLINE) 25 MG tablet Take 25 mg by mouth 3 (three) times daily. Take with a 50 mg tablet for a 75 mg dose    Historical Provider, MD  hydrALAZINE (APRESOLINE) 50 MG tablet Take 50 mg by mouth 3 (three) times daily. Take with a 25 mg tablet for a 75 mg dose    Historical Provider, MD  insulin NPH-regular Human (HUMULIN 70/30) (70-30) 100 UNIT/ML injection Inject 10 Units into the skin 2 (two) times daily with a meal. 06/18/14   Renae Fickle, MD  levothyroxine (SYNTHROID, LEVOTHROID) 50 MCG tablet Take 1 tablet (50 mcg total) by mouth daily. 03/24/14   Shanker Levora Dredge, MD  metoprolol (LOPRESSOR) 50 MG tablet Take 1 tablet (50 mg total) by mouth 2 (two) times daily. 06/18/14   Renae Fickle, MD  nystatin (MYCOSTATIN) 100000 UNIT/ML suspension Take 5 mLs (500,000 Units total) by mouth 4 (four) times daily. 06/19/14   Renae Fickle, MD  predniSONE (DELTASONE) 10 MG tablet 2 tabs daily x 2 days, 1 tab daily x 2 days, half tab daily x 2 days, then stop 06/18/14   Renae Fickle, MD   BP 192/120  Pulse 85  Temp(Src) 98 F (36.7 C) (Rectal)  Resp 20  Ht  (1.6 m)  Wt 180 lb (81.647 kg)  BMI 31.89 kg/m2  SpO2 100% Physical Exam  Constitutional: She appears well-developed and well-nourished. No distress.  HENT:  Head: Normocephalic and atraumatic.  Right Ear: Hearing normal.  Left Ear: Hearing normal.  Nose: Nose normal.  Mouth/Throat: Oropharynx is clear and moist and mucous membranes are normal.  Dried food around her mouth and small amount of food bolus under tongue and in mouth  Eyes: Conjunctivae and EOM are normal. Pupils are equal, round, and reactive to light.  Neck: Normal range of motion. Neck supple.  Cardiovascular: Regular rhythm, S1 normal and S2 normal.  Exam reveals no gallop and no friction rub.   No murmur heard. Pulmonary/Chest: Accessory muscle usage present. Tachypnea noted. No respiratory distress. She has decreased breath sounds. She has rales. She exhibits no tenderness.  Abdominal: Soft. Normal appearance and bowel sounds are normal. There is no hepatosplenomegaly. There is no tenderness. There is no rebound, no guarding, no tenderness at McBurney's point and negative Murphy's sign. No hernia.  Musculoskeletal: Normal range of motion. She exhibits edema.  Neurological: She is alert. She has normal strength. She is disoriented. No cranial nerve deficit or sensory deficit. Coordination normal. GCS eye subscore is 4. GCS verbal subscore is 4. GCS motor subscore is 6.  Skin: Skin is warm, dry and intact. No rash noted. No  cyanosis.  Psychiatric: She has a normal mood and affect. Her speech is normal and behavior is normal. Thought content normal.    ED Course  Procedures (including critical care time)  Angiocath insertion Performed by: Annick Dimaio J.  Consent: Verbal consent obtained. Risks and benefits: risks, benefits and alternatives were discussed Time out: Immediately prior to procedure a "time out" was called to verify the correct patient, procedure, equipment, support staff and site/side marked as required.  Preparation: Patient was prepped and draped in the usual sterile fashion.  Vein Location: left antecub  Ultrasound Guided  Gauge: 20G  Normal blood return and flush without difficulty Patient tolerance: Patient tolerated the procedure well with no immediate complications.    Labs Review Labs Reviewed  CBC WITH DIFFERENTIAL - Abnormal; Notable for the following:    RBC 3.56 (*)  Hemoglobin 10.5 (*)    HCT 33.3 (*)    Neutrophils Relative % 89 (*)    Neutro Abs 8.1 (*)    Lymphocytes Relative 5 (*)    Lymphs Abs 0.5 (*)    All other components within normal limits  COMPREHENSIVE METABOLIC PANEL - Abnormal; Notable for the following:    Glucose, Bld 802 (*)    BUN 34 (*)    Creatinine, Ser 1.44 (*)    Albumin 3.3 (*)    GFR calc non Af Amer 35 (*)    GFR calc Af Amer 40 (*)    Anion gap 19 (*)    All other components within normal limits  PRO B NATRIURETIC PEPTIDE - Abnormal; Notable for the following:    Pro B Natriuretic peptide (BNP) 6064.0 (*)    All other components within normal limits  CBG MONITORING, ED - Abnormal; Notable for the following:    Glucose-Capillary >600 (*)    All other components within normal limits  I-STAT ARTERIAL BLOOD GAS, ED - Abnormal; Notable for the following:    pH, Arterial 7.251 (*)    pCO2 arterial 72.7 (*)    pO2, Arterial 57.0 (*)    Bicarbonate 32.0 (*)    Acid-Base Excess 3.0 (*)    All other components within normal  limits  I-STAT ARTERIAL BLOOD GAS, ED - Abnormal; Notable for the following:    pH, Arterial 7.291 (*)    pCO2 arterial 63.5 (*)    pO2, Arterial 175.0 (*)    Bicarbonate 30.6 (*)    Acid-Base Excess 3.0 (*)    All other components within normal limits  CULTURE, BLOOD (ROUTINE X 2)  CULTURE, BLOOD (ROUTINE X 2)  URINE CULTURE  TROPONIN I  PROTIME-INR  URINALYSIS, ROUTINE W REFLEX MICROSCOPIC  I-STAT CG4 LACTIC ACID, ED    Imaging Review Ct Head Wo Contrast  07/24/2014   CLINICAL DATA:  Elevated blood glucose, hypertensive, mental status change  EXAM: CT HEAD WITHOUT CONTRAST  TECHNIQUE: Contiguous axial images were obtained from the base of the skull through the vertex without intravenous contrast.  COMPARISON:  06/12/2014  FINDINGS: There is no evidence of mass effect, midline shift, or extra-axial fluid collections. There is no evidence of a space-occupying lesion or intracranial hemorrhage. There is no evidence of a cortical-based area of acute infarction. There is an old right basal ganglia and bilateral thalamic lacunar infarct. There is generalized cerebral atrophy. There is periventricular white matter low attenuation likely secondary to microangiopathy.  The ventricles and sulci are appropriate for the patient's age. The basal cisterns are patent.  Visualized portions of the orbits are unremarkable. The visualized portions of the paranasal sinuses and mastoid air cells are unremarkable. Cerebrovascular atherosclerotic calcifications are noted.  The osseous structures are unremarkable.  IMPRESSION: No acute intracranial pathology.   Electronically Signed   By: Elige Ko   On: 07/24/2014 11:06   Dg Chest Port 1 View  07/24/2014   CLINICAL DATA:  Recent aspiration  EXAM: PORTABLE CHEST - 1 VIEW  COMPARISON:  06/13/2014  FINDINGS: The cardiac shadow is stable. Monitoring device is again seen. The right lung is clear. The left lung demonstrates patchy infiltrate in the upper lobe which  may be related to the patient's recent aspiration.  IMPRESSION: Left upper lobe infiltrate. Followup films to resolution are recommended.   Electronically Signed   By: Alcide Clever M.D.   On: 07/24/2014 11:09     EKG Interpretation  Date/Time:  Friday July 24 2014 09:12:06 EDT Ventricular Rate:  85 PR Interval:    QRS Duration: 94 QT Interval:  437 QTC Calculation: 520 R Axis:   0 Text Interpretation:  Atrial flutter Ventricular premature complex  Aberrant conduction of SV complex(es)  Prolonged QT interval Baseline  wander in lead(s) I II III aVR aVL V1 ST depression in Inferior injury  pattern suggests right ventricular involvement, recommend adding leads V3r  and V4r to confirm No significant change since last tracing Confirmed by  Juliany Daughety  MD, Sibley Rolison ([id number]) on 07/24/2014 9:24:05 AM      MDM   Final diagnoses:  Respiratory failure with hypoxia and hypercapnia  HCAP (healthcare-associated pneumonia)  Hyperglycemia  Acute congestive heart failure, unspecified congestive heart failure type     Patient presents to the ER for evaluation of difficulty breathing. Patient sent to the ER by ambulance after her visiting nurse discovered her having difficulty breathing this morning. EMS reportedly food pooled in her mouth. There was concern for possible aspiration. Route to the ER, patient is tachypneic and hypoxic. She was mildly hypothermic, sepsis considered. Patient initiated and a sepsis panel including empiric antibiotics for lung coverage. Patient has a penicillin allergy.  Patient also hyperglycemic. Blood sugar 800. There is no obvious diabetic ketoacidosis at this time. Patient cannot be generously flushed with fluids because of a history of congestive heart failure concern for volume overload currently based on her examination. She was initiated on IV insulin glucostabilizer protocol.  She was hypertensive on arrival. This improved after she was initiated on BiPAP.  Blood pressure is, however, slowly coming up he will need to be monitored, possible treatment with an antihypertensive if worsening. Additionally, patient does have an elevated BNP and peripheral edema. Suspect some amount of congestive heart failure contributing to her respiratory failure. Will administer IV Lasix.  The patient had a blood gas performed around which showed significant CO2 retention with hypoxia. She was initiated on BiPAP. After an hour on BiPAP, but this was repeated. She had increased oxygenation was improved CO2 retention. Patient appears to be tolerating the BiPAP comfortably. She is appropriate for stepdown unit admission to hospitalist service.  CRITICAL CARE Performed by: Gilda Crease   Total critical care time:  Critical care time was exclusive of separately billable procedures and treating other patients.  Critical care was necessary to treat or prevent imminent or life-threatening deterioration.  Critical care was time spent personally by me on the following activities: development of treatment plan with patient and/or surrogate as well as nursing, discussions with consultants, evaluation of patient's response to treatment, examination of patient, obtaining history from patient or surrogate, ordering and performing treatments and interventions, ordering and review of laboratory studies, ordering and review of radiographic studies, pulse oximetry and re-evaluation of patient's condition.       Gilda Crease, MD 07/24/14 1326

## 2014-07-24 NOTE — Progress Notes (Signed)
Dr. Vassie Loll requested we wait until after the central line is placed to get the ABG.

## 2014-07-24 NOTE — ED Notes (Signed)
Pt placed into gown on monitor upon arrival to room. Pt monitored by blood pressure , pulse ox, and 12 lead. PTs EKG given to and signed by Dr. Blinda Leatherwood

## 2014-07-25 ENCOUNTER — Inpatient Hospital Stay (HOSPITAL_COMMUNITY): Payer: Medicare HMO

## 2014-07-25 DIAGNOSIS — G9341 Metabolic encephalopathy: Secondary | ICD-10-CM

## 2014-07-25 LAB — CBC
HEMATOCRIT: 28.3 % — AB (ref 36.0–46.0)
HEMOGLOBIN: 9.3 g/dL — AB (ref 12.0–15.0)
MCH: 29.5 pg (ref 26.0–34.0)
MCHC: 32.9 g/dL (ref 30.0–36.0)
MCV: 89.8 fL (ref 78.0–100.0)
Platelets: 216 10*3/uL (ref 150–400)
RBC: 3.15 MIL/uL — AB (ref 3.87–5.11)
RDW: 13.1 % (ref 11.5–15.5)
WBC: 12.2 10*3/uL — ABNORMAL HIGH (ref 4.0–10.5)

## 2014-07-25 LAB — COMPREHENSIVE METABOLIC PANEL
ALBUMIN: 2.6 g/dL — AB (ref 3.5–5.2)
ALT: 13 U/L (ref 0–35)
AST: 12 U/L (ref 0–37)
Alkaline Phosphatase: 62 U/L (ref 39–117)
Anion gap: 13 (ref 5–15)
BUN: 38 mg/dL — ABNORMAL HIGH (ref 6–23)
CALCIUM: 8.6 mg/dL (ref 8.4–10.5)
CO2: 32 mEq/L (ref 19–32)
Chloride: 103 mEq/L (ref 96–112)
Creatinine, Ser: 1.92 mg/dL — ABNORMAL HIGH (ref 0.50–1.10)
GFR calc non Af Amer: 24 mL/min — ABNORMAL LOW (ref >90)
GFR, EST AFRICAN AMERICAN: 28 mL/min — AB (ref >90)
GLUCOSE: 95 mg/dL (ref 70–99)
POTASSIUM: 3.9 meq/L (ref 3.7–5.3)
Sodium: 148 mEq/L — ABNORMAL HIGH (ref 137–147)
TOTAL PROTEIN: 5.7 g/dL — AB (ref 6.0–8.3)
Total Bilirubin: 0.3 mg/dL (ref 0.3–1.2)

## 2014-07-25 LAB — GLUCOSE, CAPILLARY
GLUCOSE-CAPILLARY: 135 mg/dL — AB (ref 70–99)
GLUCOSE-CAPILLARY: 168 mg/dL — AB (ref 70–99)
GLUCOSE-CAPILLARY: 185 mg/dL — AB (ref 70–99)
GLUCOSE-CAPILLARY: 214 mg/dL — AB (ref 70–99)
GLUCOSE-CAPILLARY: 249 mg/dL — AB (ref 70–99)
Glucose-Capillary: 401 mg/dL — ABNORMAL HIGH (ref 70–99)
Glucose-Capillary: 321 mg/dL — ABNORMAL HIGH (ref 70–99)
Glucose-Capillary: 299 mg/dL — ABNORMAL HIGH (ref 70–99)
Glucose-Capillary: 233 mg/dL — ABNORMAL HIGH (ref 70–99)
Glucose-Capillary: 120 mg/dL — ABNORMAL HIGH (ref 70–99)
Glucose-Capillary: 105 mg/dL — ABNORMAL HIGH (ref 70–99)
Glucose-Capillary: 105 mg/dL — ABNORMAL HIGH (ref 70–99)
Glucose-Capillary: 102 mg/dL — ABNORMAL HIGH (ref 70–99)
Glucose-Capillary: 155 mg/dL — ABNORMAL HIGH (ref 70–99)
Glucose-Capillary: 138 mg/dL — ABNORMAL HIGH (ref 70–99)
Glucose-Capillary: 279 mg/dL — ABNORMAL HIGH (ref 70–99)

## 2014-07-25 LAB — URINE CULTURE
Colony Count: NO GROWTH
Culture: NO GROWTH

## 2014-07-25 LAB — BASIC METABOLIC PANEL
ANION GAP: 12 (ref 5–15)
ANION GAP: 13 (ref 5–15)
BUN: 39 mg/dL — ABNORMAL HIGH (ref 6–23)
BUN: 35 mg/dL — AB (ref 6–23)
CALCIUM: 8.7 mg/dL (ref 8.4–10.5)
CO2: 32 mEq/L (ref 19–32)
CO2: 31 meq/L (ref 19–32)
CREATININE: 1.95 mg/dL — AB (ref 0.50–1.10)
CREATININE: 1.64 mg/dL — AB (ref 0.50–1.10)
Calcium: 8.5 mg/dL (ref 8.4–10.5)
Chloride: 103 mEq/L (ref 96–112)
Chloride: 102 mEq/L (ref 96–112)
GFR calc Af Amer: 34 mL/min — ABNORMAL LOW (ref >90)
GFR calc non Af Amer: 30 mL/min — ABNORMAL LOW (ref >90)
GFR, EST AFRICAN AMERICAN: 28 mL/min — AB (ref >90)
GFR, EST NON AFRICAN AMERICAN: 24 mL/min — AB (ref >90)
Glucose, Bld: 170 mg/dL — ABNORMAL HIGH (ref 70–99)
Glucose, Bld: 231 mg/dL — ABNORMAL HIGH (ref 70–99)
Potassium: 3.5 mEq/L — ABNORMAL LOW (ref 3.7–5.3)
Potassium: 3.5 mEq/L — ABNORMAL LOW (ref 3.7–5.3)
SODIUM: 147 meq/L (ref 137–147)
Sodium: 146 mEq/L (ref 137–147)

## 2014-07-25 LAB — POCT I-STAT 3, ART BLOOD GAS (G3+)
Acid-Base Excess: 7 mmol/L — ABNORMAL HIGH (ref 0.0–2.0)
BICARBONATE: 31.8 meq/L — AB (ref 20.0–24.0)
O2 SAT: 97 %
PH ART: 7.427 (ref 7.350–7.450)
TCO2: 33 mmol/L (ref 0–100)
pCO2 arterial: 48.3 mmHg — ABNORMAL HIGH (ref 35.0–45.0)
pO2, Arterial: 86 mmHg (ref 80.0–100.0)

## 2014-07-25 MED ORDER — FUROSEMIDE 10 MG/ML IJ SOLN
40.0000 mg | Freq: Every day | INTRAMUSCULAR | Status: DC
Start: 2014-07-25 — End: 2014-08-03
  Administered 2014-07-25 – 2014-08-02 (×9): 40 mg via INTRAVENOUS
  Filled 2014-07-25 (×11): qty 4

## 2014-07-25 MED ORDER — INSULIN ASPART 100 UNIT/ML ~~LOC~~ SOLN
0.0000 [IU] | SUBCUTANEOUS | Status: DC
  Administered 2014-07-25 (×2): 8 [IU] via SUBCUTANEOUS
  Administered 2014-07-25: 2 [IU] via SUBCUTANEOUS
  Administered 2014-07-25 – 2014-07-26 (×4): 3 [IU] via SUBCUTANEOUS
  Administered 2014-07-26: 15 [IU] via SUBCUTANEOUS
  Administered 2014-07-26 – 2014-07-27 (×3): 5 [IU] via SUBCUTANEOUS
  Administered 2014-07-27 (×2): 3 [IU] via SUBCUTANEOUS
  Administered 2014-07-27 (×2): 5 [IU] via SUBCUTANEOUS
  Administered 2014-07-28 (×2): 3 [IU] via SUBCUTANEOUS
  Administered 2014-07-28: 2 [IU] via SUBCUTANEOUS
  Administered 2014-07-28 – 2014-07-29 (×2): 3 [IU] via SUBCUTANEOUS
  Administered 2014-07-29 (×2): 5 [IU] via SUBCUTANEOUS
  Administered 2014-07-29: 3 [IU] via SUBCUTANEOUS
  Administered 2014-07-29: 8 [IU] via SUBCUTANEOUS
  Administered 2014-07-29: 2 [IU] via SUBCUTANEOUS
  Administered 2014-07-30: 3 [IU] via SUBCUTANEOUS

## 2014-07-25 MED ORDER — HYDRALAZINE HCL 25 MG PO TABS: 25.0000 mg | ORAL_TABLET | Freq: Three times a day (TID) | ORAL | Status: DC

## 2014-07-25 MED ORDER — INSULIN GLARGINE 100 UNIT/ML ~~LOC~~ SOLN
10.0000 [IU] | Freq: Every day | SUBCUTANEOUS | Status: DC
  Administered 2014-07-25: 10 [IU] via SUBCUTANEOUS
  Filled 2014-07-25 (×2): qty 0.1

## 2014-07-25 MED ORDER — PANTOPRAZOLE SODIUM 40 MG IV SOLR
40.0000 mg | Freq: Every day | INTRAVENOUS | Status: DC
  Filled 2014-07-25: qty 40

## 2014-07-25 MED ORDER — INSULIN GLARGINE 100 UNIT/ML ~~LOC~~ SOLN: 10.0000 [IU] | Freq: Every day | SUBCUTANEOUS | Status: DC

## 2014-07-25 MED ORDER — PRO-STAT SUGAR FREE PO LIQD
30.0000 mL | Freq: Two times a day (BID) | ORAL | Status: DC
  Administered 2014-07-25 – 2014-07-28 (×7): 30 mL
  Filled 2014-07-25 (×12): qty 30

## 2014-07-25 MED ORDER — HYDRALAZINE HCL 50 MG PO TABS: 50.0000 mg | ORAL_TABLET | Freq: Three times a day (TID) | ORAL | Status: DC

## 2014-07-25 MED ORDER — VITAL HIGH PROTEIN PO LIQD: 1000.0000 mL | ORAL | Status: DC

## 2014-07-25 MED ORDER — DEXMEDETOMIDINE HCL IN NACL 200 MCG/50ML IV SOLN
0.4000 ug/kg/h | INTRAVENOUS | Status: DC
  Administered 2014-07-25: 1 ug/kg/h via INTRAVENOUS
  Administered 2014-07-25: 0.6 ug/kg/h via INTRAVENOUS
  Administered 2014-07-25: 0.4 ug/kg/h via INTRAVENOUS
  Administered 2014-07-25: 1 ug/kg/h via INTRAVENOUS
  Administered 2014-07-25 – 2014-07-27 (×6): 0.6 ug/kg/h via INTRAVENOUS
  Administered 2014-07-27: 1.2 ug/kg/h via INTRAVENOUS
  Filled 2014-07-25 (×13): qty 50

## 2014-07-25 MED ORDER — VITAL AF 1.2 CAL PO LIQD
1000.0000 mL | ORAL | Status: DC
  Administered 2014-07-25 – 2014-07-29 (×4): 1000 mL
  Filled 2014-07-25 (×6): qty 1000

## 2014-07-25 MED ORDER — HYDRALAZINE HCL 20 MG/ML IJ SOLN
10.0000 mg | INTRAMUSCULAR | Status: DC | PRN
  Administered 2014-07-25 (×3): 20 mg via INTRAVENOUS
  Administered 2014-07-26: 10 mg via INTRAVENOUS
  Administered 2014-07-28 (×3): 20 mg via INTRAVENOUS
  Administered 2014-07-29: 40 mg via INTRAVENOUS
  Administered 2014-07-29: 20 mg via INTRAVENOUS
  Administered 2014-07-30 – 2014-07-31 (×5): 40 mg via INTRAVENOUS
  Filled 2014-07-25: qty 1
  Filled 2014-07-25 (×3): qty 2
  Filled 2014-07-25: qty 1
  Filled 2014-07-25: qty 2
  Filled 2014-07-25 (×2): qty 1
  Filled 2014-07-25: qty 2
  Filled 2014-07-25 (×4): qty 1

## 2014-07-25 MED ORDER — HYDRALAZINE HCL 25 MG PO TABS
75.0000 mg | ORAL_TABLET | Freq: Three times a day (TID) | ORAL | Status: DC
  Administered 2014-07-25 – 2014-07-27 (×7): 75 mg via ORAL
  Filled 2014-07-25 (×10): qty 1

## 2014-07-25 NOTE — Progress Notes (Signed)
eLink Physician-Brief Progress Note Patient Name: [name] [name] DOB: 08-15-1941 MRN: [id number]   Date of Service  07/25/2014  HPI/Events of Note   Needs PPI   eICU Interventions   Protonix ordered      Intervention Category Minor Interventions: Routine modifications to care plan (e.g. PRN medications for pain, fever)  [name] [name] R. 07/25/2014, 10:36 PM

## 2014-07-25 NOTE — Progress Notes (Signed)
INITIAL NUTRITION ASSESSMENT  DOCUMENTATION CODES Per approved criteria  -Not Applicable   INTERVENTION: Initiate Vital AF @ 20 ml/hr via OGT and increase by 10 ml every 4 hours to goal rate of 40 ml/hr.   30 ml Prostat BID.    Tube feeding regimen provides 1352 kcal (96% of needs), 102 grams of protein, and 778 ml of H2O.   NUTRITION DIAGNOSIS: Inadequate oral intake related to inability to eat as evidenced by NPO.   Goal: Pt to meet >/= 90% of their estimated nutrition needs   Monitor:  Weight, initiation and toleration of TF, labs, respiratory status  Reason for Assessment: Consult for TF initiation and management  73 y.o. female  Admitting Dx: Acute respiratory failure with hypoxia and hypercarbia  ASSESSMENT: [name] with COPD, CVA, CK D., Diabetes admitted 9/18 with acute respiratory distress , hypercarbic respiratory failure, DKA with glucose of 802 and anion gap of 19 , requiring mechanical ventilation.  Patient is currently intubated on ventilator support MV: 6.4 L/min Temp (24hrs), Avg:97.9 F (36.6 C), Min:96.4 F (35.8 C), Max:100.1 F (37.8 C) - Pt with no signs of fat or muscle wasting  Labs: Na and BUN elevated K WNL CBGs: 102-155  Height: Ht Readings from Last 1 Encounters:  07/24/14  (1.6 m)    Weight: Wt Readings from Last 1 Encounters:  07/25/14 169 lb 5 oz (76.8 kg)    Ideal Body Weight: 52.4 kg  % Ideal Body Weight: 147%  Wt Readings from Last 10 Encounters:  07/25/14 169 lb 5 oz (76.8 kg)  07/20/14 188 lb (85.276 kg)  07/16/14 188 lb (85.276 kg)  07/06/14 173 lb (78.472 kg)  06/14/14 182 lb 15.7 oz (83 kg)  05/21/14 161 lb (73.029 kg)  05/03/14 160 lb 11.5 oz (72.9 kg)  03/23/14 178 lb 1.6 oz (80.786 kg)  03/23/14 178 lb 1.6 oz (80.786 kg)  03/23/14 178 lb 1.6 oz (80.786 kg)    Usual Body Weight: unknown  % Usual Body Weight: n/a  BMI:  Body mass index is 30 kg/(m^2).  Estimated Nutritional Needs: Kcal:  1413 Protein: 100-115 g Fluid: Per MD  Skin: WNL  Diet Order: NPO  EDUCATION NEEDS: -Education not appropriate at this time   Intake/Output Summary (Last 24 hours) at 07/25/14 1022 Last data filed at 07/25/14 1003  Gross per 24 hour  Intake 1144.7 ml  Output    445 ml  Net  699.7 ml    Last BM: none recorded   Labs:   Recent Labs Lab 07/24/14 2302 07/25/14 0525 07/25/14 0824  NA 150* 148* 147  K 4.0 3.9 3.5*  CL 106 103 103  CO2 33* 32 32  BUN 38* 38* 39*  CREATININE 1.77* 1.92* 1.95*  CALCIUM 8.5 8.6 8.7  GLUCOSE 207* 95 170*    CBG (last 3)   Recent Labs  07/25/14 0351 07/25/14 0500 07/25/14 0714  GLUCAP 105* 102* 155*    Scheduled Meds: . antiseptic oral rinse  7 mL Mouth Rinse QID  . antiseptic oral rinse  7 mL Mouth Rinse QID  . aztreonam  1 g Intravenous Q8H  . chlorhexidine  15 mL Mouth Rinse BID  . feeding supplement (VITAL HIGH PROTEIN)  1,000 mL Per Tube Q24H  . furosemide  40 mg Intravenous Daily  . heparin  5,000 Units Subcutaneous 3 times per day  . hydrALAZINE  10 mg Intravenous Once  . hydrALAZINE  75 mg Oral TID  . insulin aspart  0-15 Units Subcutaneous 6 times per day  . insulin glargine  10 Units Subcutaneous Daily  . ipratropium-albuterol  3 mL Nebulization Q4H  . levothyroxine  25 mcg Intravenous Daily  . methylPREDNISolone (SOLU-MEDROL) injection  40 mg Intravenous Daily  . sodium chloride  750 mL Intravenous Once  . sodium chloride  3 mL Intravenous Q12H  . vancomycin  1,250 mg Intravenous Q24H    Continuous Infusions: . insulin (NOVOLIN-R) infusion Stopped (07/25/14 0600)    Past Medical History  Diagnosis Date  . Hypertension   . Diabetes mellitus   . Chronic renal insufficiency   . Hyperlipidemia   . Osteopenia   . CVA (cerebral infarction)   . Thyroid disease     Past Surgical History  Procedure Laterality Date  . Eye surgery    . Loop recorder implant  03-24-2014    MDT LinQ implanted by Dr Johney Frame for  cryptogenic stroke  . Tee without cardioversion N/A 03/23/2014    Procedure: TRANSESOPHAGEAL ECHOCARDIOGRAM (TEE);  Surgeon: Lars Masson, MD;  Location: [facility] ENDOSCOPY;  Service: Cardiovascular;  Laterality: N/A;    Ebbie Latus RD, LDN

## 2014-07-25 NOTE — Progress Notes (Addendum)
PULMONARY / CRITICAL CARE MEDICINE   Name: [name] [name] MRN: [id number] DOB: Oct 01, 1941    ADMISSION DATE:  07/24/2014 CONSULTATION DATE:  07/25/2014  REFERRING MD :  Rhona Leavens  CHIEF COMPLAINT:  Acute respiratory distress  INITIAL PRESENTATION: [name] with COPD, CVA, CK D., Diabetes admitted 9/18 with acute respiratory distress , hypercarbic respiratory failure, DKA with glucose of 802 and anion gap of 19 , requiring mechanical ventilation. Chest x-ray showed left upper lobe infiltrate. 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  STUDIES:  9/18 head CT-no mass shift or bleed  SIGNIFICANT EVENTS: 9/18 intubated 9/19 off insulin gtt   SUBJECTIVE: agitated intermittently Afebrile Low UO Off insulin gtt  VITAL SIGNS: Temp:  [96.4 F (35.8 C)-100.1 F (37.8 C)] 100.1 F (37.8 C) (09/19 0800) Pulse Rate:  [60-93] 87 (09/19 0800) Resp:  [12-29] 18 (09/19 0336) BP: (103-219)/(39-120) 163/60 mmHg (09/19 0800) SpO2:  [100 %] 100 % (09/19 0800) FiO2 (%):  [40 %-50 %] 40 % (09/19 0800) Weight:  [75.7 kg (166 lb 14.2 oz)-81.647 kg (180 lb)] 76.8 kg (169 lb 5 oz) (09/19 0400) HEMODYNAMICS: CVP:  [8 mmHg-15 mmHg] 14 mmHg VENTILATOR SETTINGS: Vent Mode:  [-] PSV;CPAP FiO2 (%):  [40 %-50 %] 40 % Set Rate:  [12 bmp-16 bmp] 12 bmp Vt Set:  [500 mL] 500 mL PEEP:  [5 cmH20] 5 cmH20 Pressure Support:  [5 cmH20] 5 cmH20 Plateau Pressure:  [19 cmH20-24 cmH20] 20 cmH20 INTAKE / OUTPUT:  Intake/Output Summary (Last 24 hours) at 07/25/14 0903 Last data filed at 07/25/14 0800  Gross per 24 hour  Intake 1141.7 ml  Output    445 ml  Net  696.7 ml    PHYSICAL EXAMINATION: General:  Appears chronically ill,  orally intubated Neuro:  Agitated, RASS  +3, does not follow commands, non focal HEENT:  JVD 4 cm, pupils the millimeters reactive to light Cardiovascular:  S1-S2 normal, no murmur Lungs:  Decreased breath sounds bilateral, faint rhonchi Abdomen:  Soft distended nontender Musculoskeletal:  No joint swelling Skin:  No skin rash  LABS:  CBC  Recent Labs Lab 07/24/14 1000 07/25/14 0525  WBC 9.1 12.2*  HGB 10.5* 9.3*  HCT 33.3* 28.3*  PLT 237 216   Coag's  Recent Labs Lab 07/24/14 1000  INR 1.14   BMET  Recent Labs Lab 07/24/14 1950 07/24/14 2302 07/25/14 0525  NA 149* 150* 148*  K 3.7 4.0 3.9  CL 105 106 103  CO2 30 33* 32  BUN 37* 38* 38*  CREATININE 1.66* 1.77* 1.92*  GLUCOSE 301* 207* 95   Electrolytes  Recent Labs Lab 07/24/14 1950 07/24/14 2302 07/25/14 0525  CALCIUM 8.4 8.5 8.6   Sepsis Markers  Recent Labs Lab 07/24/14 1022 07/24/14 1845  LATICACIDVEN 1.73 4.3*   ABG  Recent Labs Lab 07/24/14 1033 07/24/14 1307 07/25/14 0838  PHART 7.251* 7.291* 7.427  PCO2ART 72.7* 63.5* 48.3*  PO2ART 57.0* 175.0* 86.0   Liver Enzymes  Recent Labs Lab 07/24/14 1000 07/24/14 1950 07/25/14 0525  AST ALT ALKPHOS 91 73 62  BILITOT 0.3 0.3 0.3  ALBUMIN 3.3* 2.8* 2.6*   Cardiac Enzymes  Recent Labs Lab 07/24/14 1000 07/24/14 1950  TROPONINI <0.30 <  0.30  PROBNP 6064.0*  --    Glucose  Recent Labs Lab 07/25/14 0108 07/25/14 0158 07/25/14 0306 07/25/14 0351 07/25/14 0500 07/25/14 0714  GLUCAP 135* 120* 105* 105* 102* 155*    Imaging Ct Head Wo Contrast  07/24/2014   CLINICAL DATA:  Elevated blood glucose, hypertensive, mental status change  EXAM: CT HEAD WITHOUT CONTRAST  TECHNIQUE: Contiguous axial images were obtained from the base of the skull through the vertex without intravenous contrast.  COMPARISON:  06/12/2014  FINDINGS: There is no evidence of mass effect, midline shift, or extra-axial fluid collections. There is no evidence of a  space-occupying lesion or intracranial hemorrhage. There is no evidence of a cortical-based area of acute infarction. There is an old right basal ganglia and bilateral thalamic lacunar infarct. There is generalized cerebral atrophy. There is periventricular white matter low attenuation likely secondary to microangiopathy.  The ventricles and sulci are appropriate for the patient's age. The basal cisterns are patent.  Visualized portions of the orbits are unremarkable. The visualized portions of the paranasal sinuses and mastoid air cells are unremarkable. Cerebrovascular atherosclerotic calcifications are noted.  The osseous structures are unremarkable.  IMPRESSION: No acute intracranial pathology.   Electronically Signed   By: Elige Ko   On: 07/24/2014 11:06   Portable Chest Xray  07/24/2014   CLINICAL DATA:  Interval intubation of the patient ; assess support tube and line. Placement  EXAM: PORTABLE CHEST - 1 VIEW  COMPARISON:  Portable chest x-ray of today's date.  FINDINGS: The endotracheal tube tip lies 2 cm above the crotch of the carina. The right internal jugular venous catheter tip lies at the level of the junction of the right and left brachiocephalic veins. The esophagogastric tube tip projects below the inferior margin of the image. A loop recorder is visible over the left atrial region. The cardiac silhouette is normal in size. The pulmonary vascularity is not engorged. There is confluent alveolar opacity in the left perihilar region that is more conspicuous than on the earlier study. The bony thorax exhibits no acute abnormality.  IMPRESSION: 1. The endotracheal tube tip lies 2.1 cm above the crotch of the carina. Withdrawal by 2-3 cm is recommended to avoid accidental mainstem bronchus intubation. 2. The left internal jugular venous catheter and the esophagogastric tube are in appropriate position radiographically. There is no postprocedure pneumothorax or hemothorax. 3. There is increased  alveolar opacity in the low left perihilar region consistent with pneumonia. 4. These results were called by telephone at the time of interpretation on 07/24/2014 at 7:21 pm to [facility], RN, who verbally acknowledged these results.   Electronically Signed   By: David  [place]   On: 07/24/2014 19:26   Dg Chest Port 1 View  07/24/2014   CLINICAL DATA:  Recent aspiration  EXAM: PORTABLE CHEST - 1 VIEW  COMPARISON:  06/13/2014  FINDINGS: The cardiac shadow is stable. Monitoring device is again seen. The right lung is clear. The left lung demonstrates patchy infiltrate in the upper lobe which may be related to the patient's recent aspiration.  IMPRESSION: Left upper lobe infiltrate. Followup films to resolution are recommended.   Electronically Signed   By: Alcide Clever M.D.   On: 07/24/2014 11:09     ASSESSMENT / PLAN:  PULMONARY OETT 9/18>> A: Acute hypercarbic respiratory failure COPD Left upper lobe HCAP - note recent discharge from nursing home P:   PR VC, keep respiratory rate 15 -watch for auto PEEP if rate high  Ct Solu-Medrol to every 24 hours  DuoNeb's  CARDIOVASCULAR CVL A: Hypotension -related to positive pressure ventilation and meds TEE 03/2014- normal LV function Atrial flutter and prolonged QT on EKG P:  resume  Hydralazine, hold metoprolol  RENAL A:  AK I on CKD stage III Appears volume overloaded P:   Avoid nephrotoxins Resume lasix 40 IV  GASTROINTESTINAL A:  No issues P:   Protonix TFs if not extubated soon  HEMATOLOGIC A:  No issues P:  SQheparin for DVT prophylaxis  INFECTIOUS A:  LUL HCAP, allergic to penicillin P:   BCx2 9/18 >> UC 9/18 >> Sputum9/18 >> Abx: Aztreonam, start date= 9/18 Vancomycin 9/18  ENDOCRINE A:  DKA versus hyperosmolar, increased anion gap favors DKA   P:   Off insulin gtt, dc dextrose IVFs Add lantus 10 9simplify from NPH bid at home)  NEUROLOGIC A:  Acute encephalopathy, related to hypercarbia and infection P:    RASS goal: 0 to -1 Fentanyl when necessary Use fentanyl gtt if needed  TODAY'S SUMMARY: Acute hypercarbic respiratory failure requiring mechanical ventilation in this 73 year old woman with COPD and recent CVA and left upper lobe HCAP Recurent admits - d/w son, POA, will need NH placement  - husband cannot take care of her at home  I have personally obtained a history, examined the patient, evaluated laboratory and imaging results, formulated the assessment and plan and placed orders. CRITICAL CARE: The patient is critically ill with multiple organ systems failure and requires high complexity decision making for assessment and support, frequent evaluation and titration of therapies, application of advanced monitoring technologies and extensive interpretation of multiple databases. Critical Care Time devoted to patient care services described in this note is 40 minutes.    Cyril Mourning MD. Tonny Bollman. [facility] Pager [phone] If no response call [phone]    07/25/2014, 9:03 AM

## 2014-07-25 NOTE — Progress Notes (Signed)
RT note-expiratory minute ventilation low, placed back to full support.

## 2014-07-25 NOTE — Progress Notes (Signed)
RT note-weaning on 5/5 40%, tolerating well.

## 2014-07-25 NOTE — Progress Notes (Signed)
CRITICAL VALUE ALERT  Critical value received:  Positive blood cultures. Aerobic bottle grew gram positive cocci in pairs.  Date of notification:  07/25/2014  Time of notification:  1042   Critical value read back:Yes.    Nurse who received alert:  Wynona Canes  MD notified (1st page):  Dr. Vassie Loll  Time of first page:  1048  MD notified (2nd page):  Time of second page:  Responding MD:  Dr. Vassie Loll   Time MD responded:  [phone]

## 2014-07-26 ENCOUNTER — Inpatient Hospital Stay (HOSPITAL_COMMUNITY): Payer: Medicare HMO

## 2014-07-26 DIAGNOSIS — A419 Sepsis, unspecified organism: Secondary | ICD-10-CM

## 2014-07-26 DIAGNOSIS — R6520 Severe sepsis without septic shock: Secondary | ICD-10-CM

## 2014-07-26 LAB — CBC
HCT: 31.1 % — ABNORMAL LOW (ref 36.0–46.0)
HEMOGLOBIN: 10.4 g/dL — AB (ref 12.0–15.0)
MCH: 29.8 pg (ref 26.0–34.0)
MCHC: 33.4 g/dL (ref 30.0–36.0)
MCV: 89.1 fL (ref 78.0–100.0)
Platelets: 192 10*3/uL (ref 150–400)
RBC: 3.49 MIL/uL — ABNORMAL LOW (ref 3.87–5.11)
RDW: 13.6 % (ref 11.5–15.5)
WBC: 12.1 10*3/uL — ABNORMAL HIGH (ref 4.0–10.5)

## 2014-07-26 LAB — BASIC METABOLIC PANEL
Anion gap: 11 (ref 5–15)
BUN: 39 mg/dL — AB (ref 6–23)
CALCIUM: 8.4 mg/dL (ref 8.4–10.5)
CO2: 30 mEq/L (ref 19–32)
Chloride: 104 mEq/L (ref 96–112)
Creatinine, Ser: 1.44 mg/dL — ABNORMAL HIGH (ref 0.50–1.10)
GFR, EST AFRICAN AMERICAN: 40 mL/min — AB (ref >90)
GFR, EST NON AFRICAN AMERICAN: 35 mL/min — AB (ref >90)
GLUCOSE: 198 mg/dL — AB (ref 70–99)
Potassium: 3.5 mEq/L — ABNORMAL LOW (ref 3.7–5.3)
SODIUM: 145 meq/L (ref 137–147)

## 2014-07-26 LAB — CULTURE, BLOOD (ROUTINE X 2)

## 2014-07-26 LAB — GLUCOSE, CAPILLARY
GLUCOSE-CAPILLARY: 185 mg/dL — AB (ref 70–99)
GLUCOSE-CAPILLARY: 152 mg/dL — AB (ref 70–99)
GLUCOSE-CAPILLARY: 109 mg/dL — AB (ref 70–99)
Glucose-Capillary: 221 mg/dL — ABNORMAL HIGH (ref 70–99)
Glucose-Capillary: 251 mg/dL — ABNORMAL HIGH (ref 70–99)
Glucose-Capillary: 407 mg/dL — ABNORMAL HIGH (ref 70–99)

## 2014-07-26 MED ORDER — METOPROLOL TARTRATE 25 MG PO TABS
25.0000 mg | ORAL_TABLET | Freq: Two times a day (BID) | ORAL | Status: DC
Start: 2014-07-26 — End: 2014-07-27
  Administered 2014-07-26 – 2014-07-27 (×3): 25 mg via ORAL
  Filled 2014-07-26 (×4): qty 1

## 2014-07-26 MED ORDER — LEVOTHYROXINE SODIUM 50 MCG PO TABS
50.0000 ug | ORAL_TABLET | Freq: Every day | ORAL | Status: DC
  Administered 2014-07-26 – 2014-07-27 (×2): 50 ug via ORAL
  Filled 2014-07-26 (×3): qty 1

## 2014-07-26 MED ORDER — PANTOPRAZOLE SODIUM 40 MG PO PACK
40.0000 mg | PACK | Freq: Every day | ORAL | Status: DC
  Administered 2014-07-26 – 2014-07-27 (×2): 40 mg
  Filled 2014-07-26 (×2): qty 20

## 2014-07-26 MED ORDER — PREDNISONE 5 MG/5ML PO SOLN
40.0000 mg | Freq: Every day | ORAL | Status: DC
  Administered 2014-07-27: 40 mg
  Filled 2014-07-26 (×4): qty 40

## 2014-07-26 MED ORDER — FENTANYL CITRATE 0.05 MG/ML IJ SOLN
100.0000 ug | INTRAMUSCULAR | Status: DC | PRN
  Administered 2014-07-27 (×2): 100 ug via INTRAVENOUS
  Filled 2014-07-26 (×2): qty 2

## 2014-07-26 MED ORDER — INSULIN GLARGINE 100 UNIT/ML ~~LOC~~ SOLN
15.0000 [IU] | Freq: Every day | SUBCUTANEOUS | Status: DC
  Administered 2014-07-26 – 2014-07-27 (×2): 15 [IU] via SUBCUTANEOUS
  Filled 2014-07-26 (×3): qty 0.15

## 2014-07-26 MED ORDER — POTASSIUM CHLORIDE 20 MEQ/15ML (10%) PO LIQD
40.0000 meq | Freq: Once | ORAL | Status: AC
  Administered 2014-07-26: 40 meq
  Filled 2014-07-26: qty 30

## 2014-07-26 NOTE — Progress Notes (Signed)
eLink Physician-Brief Progress Note Patient Name: [name] [name] DOB: 1941-03-14 MRN: [id number]   Date of Service  07/26/2014  HPI/Events of Note  K low  eICU Interventions  K supp     Intervention Category Major Interventions: Electrolyte abnormality - evaluation and management  [name] 07/26/2014, 6:27 AM

## 2014-07-26 NOTE — Progress Notes (Signed)
RT note- placed back to full support for bath. Resume wean after bath.

## 2014-07-26 NOTE — Progress Notes (Signed)
PULMONARY / CRITICAL CARE MEDICINE   Name: [name] [name] MRN: [id number] DOB: 11-03-1941    ADMISSION DATE:  07/24/2014 CONSULTATION DATE:  07/26/2014  REFERRING MD :  Rhona Leavens  CHIEF COMPLAINT:  Acute respiratory distress  INITIAL PRESENTATION: [name] with COPD, CVA, CK D., Diabetes admitted 9/18 with acute respiratory distress , hypercarbic respiratory failure, DKA with glucose of 802 and anion gap of 19 , requiring mechanical ventilation. Chest x-ray showed left upper lobe infiltrate. 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  STUDIES:  9/18 head CT-no mass shift or bleed  SIGNIFICANT EVENTS: 9/18 intubated 9/19 off insulin gtt   SUBJECTIVE: agitated intermittently Afebrile adequate UO   VITAL SIGNS: Temp:  [96.7 F (35.9 C)-100.3 F (37.9 C)] 96.8 F (36 C) (09/20 0400) Pulse Rate:  [48-101] 54 (09/20 0630) Resp:  [12] 12 (09/20 0354) BP: (108-197)/(47-124) 148/124 mmHg (09/20 0630) SpO2:  [98 %-100 %] 100 % (09/20 0744) FiO2 (%):  [40 %] 40 % (09/20 0746) Weight:  [77.3 kg (170 lb 6.7 oz)] 77.3 kg (170 lb 6.7 oz) (09/20 0400) HEMODYNAMICS: CVP:  [7 mmHg-14 mmHg] 8 mmHg VENTILATOR SETTINGS: Vent Mode:  [-] PSV;CPAP FiO2 (%):  [40 %] 40 % Set Rate:  [12 bmp] 12 bmp Vt Set:  [500 mL] 500 mL PEEP:  [5 cmH20] 5 cmH20 Pressure Support:  [10 cmH20] 10 cmH20 Plateau Pressure:  [16 cmH20-19 cmH20] 18 cmH20 INTAKE / OUTPUT:  Intake/Output Summary (Last 24 hours) at 07/26/14 0754 Last data filed at 07/26/14 0600  Gross per 24 hour  Intake 2090.04 ml  Output   1965 ml  Net 125.04 ml    PHYSICAL EXAMINATION: General:  Appears chronically ill,  orally intubated Neuro:  Agitated, RASS -1 to +3, does not follow commands, non  focal HEENT:  JVD 4 cm, pupils the millimeters reactive to light Cardiovascular:  S1-S2 normal, no murmur Lungs:  Decreased breath sounds bilateral, faint rhonchi Abdomen:  Soft distended nontender Musculoskeletal:  No joint swelling Skin:  No skin rash  LABS:  CBC  Recent Labs Lab 07/24/14 1000 07/25/14 0525 07/26/14 0410  WBC 9.1 12.2* 12.1*  HGB 10.5* 9.3* 10.4*  HCT 33.3* 28.3* 31.1*  PLT 237 216 192   Coag's  Recent Labs Lab 07/24/14 1000  INR 1.14   BMET  Recent Labs Lab 07/25/14 0824 07/25/14 1800 07/26/14 0410  NA 147 146 145  K 3.5* 3.5* 3.5*  CL 103 102 104  CO2 32 31 30  BUN 39* 35* 39*  CREATININE 1.95* 1.64* 1.44*  GLUCOSE 170* 231* 198*   Electrolytes  Recent Labs Lab 07/25/14 0824 07/25/14 1800 07/26/14 0410  CALCIUM 8.7 8.5 8.4   Sepsis Markers  Recent Labs Lab 07/24/14 1022 07/24/14 1845  LATICACIDVEN 1.73 4.3*   ABG  Recent Labs Lab 07/24/14 1033 07/24/14 1307 07/25/14 0838  PHART 7.251* 7.291* 7.427  PCO2ART 72.7* 63.5* 48.3*  PO2ART 57.0* 175.0* 86.0   Liver Enzymes  Recent Labs Lab 07/24/14 1000 07/24/14 1950 07/25/14 0525  AST ALT ALKPHOS 91 73 62  BILITOT 0.3 0.3 0.3  ALBUMIN 3.3* 2.8* 2.6*   Cardiac Enzymes  Recent Labs Lab 07/24/14 1000 07/24/14 1950  TROPONINI <0.30 <0.30  PROBNP 6064.0*  --    Glucose  Recent Labs Lab 07/25/14 1230 07/25/14 1513 07/25/14 2015 07/25/14 2345 07/26/14 0354 07/26/14 0704  GLUCAP 138* 168* 279* 251* 185* 221*    Imaging Portable Chest Xray In Am  07/25/2014   CLINICAL DATA:  Follow-up endotracheal tube position.  EXAM: PORTABLE CHEST - 1 VIEW  COMPARISON:  07/24/2014  FINDINGS: Endotracheal tube remains in place with tip approximately 2-2.5 cm above the carina. Left jugular central venous catheter is unchanged with tip near the brachiocephalic vein confluence. Enteric tube courses into the left upper abdomen. Loop recorder is again  seen. Cardiac silhouette is within normal limits for size. Left perihilar opacity has decreased from the prior study. Right lung remains clear. No definite pleural effusion or pneumothorax is identified.  IMPRESSION: 1. Endotracheal tube 2-2.5 cm above the carina. 2. Improved left upper lobe aeration.   Electronically Signed   By: Sebastian Ache   On: 07/25/2014 09:13     ASSESSMENT / PLAN:  PULMONARY OETT 9/18>> A: Acute hypercarbic respiratory failure COPD Left upper lobe HCAP - note recent discharge from nursing home P:   Change to prednisone 40 daily- taper once extubated DuoNeb's  CARDIOVASCULAR CVL A: Hypotension -related to positive pressure ventilation and meds TEE 03/2014- normal LV function Atrial flutter and prolonged QT on EKG P:  resumed  Hydralazine,  Resume metoprolol since no bspasm  RENAL A:  AK I on CKD stage III Appears volume overloaded P:   Avoid nephrotoxins Resumed lasix 40 IV  GASTROINTESTINAL A:  No issues P:   Protonix TFs   HEMATOLOGIC A:  No issues P:  SQheparin for DVT prophylaxis  INFECTIOUS A:  LUL HCAP, allergic to penicillin P:   BCx2 9/18 >>GPC pairs >> UC 9/18 >>ng Sputum9/18 >> Abx: Aztreonam, start date= 9/18 Vancomycin 9/18  ENDOCRINE A:  DKA versus hyperosmolar, increased anion gap favors DKA   Off insulin gtt P:   Increase lantus 15 (simplify from NPH bid at home)  NEUROLOGIC A:  Acute encephalopathy, related to hypercarbia and infection P:   RASS goal: 0 to -1 Fentanyl when necessary Use fentanyl gtt if needed  TODAY'S SUMMARY: Acute hypercarbic respiratory failure and left upper lobe HCAP, GPC bacteremia requiring mechanical ventilation in this 73 year old woman with COPD and recent CVA  Recurent admits - d/w son, POA, will need NH placement  - husband cannot take care of her at home  I have personally obtained a history, examined the patient, evaluated laboratory and imaging results, formulated the assessment  and plan and placed orders. CRITICAL CARE: The patient is critically ill with multiple organ systems failure and requires high complexity decision making for assessment and support, frequent evaluation and titration of therapies, application of advanced monitoring technologies and extensive interpretation of multiple databases. Critical Care Time devoted to patient care services described in this note is 35 minutes.    Cyril Mourning MD. Tonny Bollman. [facility] Pager [phone] If no response call [phone]    07/26/2014, 7:54 AM

## 2014-07-27 ENCOUNTER — Inpatient Hospital Stay (HOSPITAL_COMMUNITY): Payer: Medicare HMO

## 2014-07-27 DIAGNOSIS — E1165 Type 2 diabetes mellitus with hyperglycemia: Secondary | ICD-10-CM

## 2014-07-27 DIAGNOSIS — N183 Chronic kidney disease, stage 3 unspecified: Secondary | ICD-10-CM

## 2014-07-27 DIAGNOSIS — J438 Other emphysema: Secondary | ICD-10-CM

## 2014-07-27 DIAGNOSIS — E1129 Type 2 diabetes mellitus with other diabetic kidney complication: Secondary | ICD-10-CM

## 2014-07-27 DIAGNOSIS — R7309 Other abnormal glucose: Secondary | ICD-10-CM

## 2014-07-27 LAB — BASIC METABOLIC PANEL
ANION GAP: 8 (ref 5–15)
BUN: 49 mg/dL — AB (ref 6–23)
CHLORIDE: 110 meq/L (ref 96–112)
CO2: 33 mEq/L — ABNORMAL HIGH (ref 19–32)
Calcium: 8.2 mg/dL — ABNORMAL LOW (ref 8.4–10.5)
Creatinine, Ser: 1.25 mg/dL — ABNORMAL HIGH (ref 0.50–1.10)
GFR calc Af Amer: 48 mL/min — ABNORMAL LOW (ref >90)
GFR, EST NON AFRICAN AMERICAN: 41 mL/min — AB (ref >90)
Glucose, Bld: 210 mg/dL — ABNORMAL HIGH (ref 70–99)
POTASSIUM: 3.8 meq/L (ref 3.7–5.3)
SODIUM: 151 meq/L — AB (ref 137–147)

## 2014-07-27 LAB — GLUCOSE, CAPILLARY
GLUCOSE-CAPILLARY: 169 mg/dL — AB (ref 70–99)
GLUCOSE-CAPILLARY: 189 mg/dL — AB (ref 70–99)
GLUCOSE-CAPILLARY: 215 mg/dL — AB (ref 70–99)
Glucose-Capillary: 328 mg/dL — ABNORMAL HIGH (ref 70–99)
Glucose-Capillary: 277 mg/dL — ABNORMAL HIGH (ref 70–99)
Glucose-Capillary: 206 mg/dL — ABNORMAL HIGH (ref 70–99)

## 2014-07-27 LAB — CBC
HCT: 30.8 % — ABNORMAL LOW (ref 36.0–46.0)
HEMOGLOBIN: 9.8 g/dL — AB (ref 12.0–15.0)
MCH: 28.9 pg (ref 26.0–34.0)
MCHC: 31.8 g/dL (ref 30.0–36.0)
MCV: 90.9 fL (ref 78.0–100.0)
PLATELETS: 178 10*3/uL (ref 150–400)
RBC: 3.39 MIL/uL — AB (ref 3.87–5.11)
RDW: 14 % (ref 11.5–15.5)
WBC: 7.8 10*3/uL (ref 4.0–10.5)

## 2014-07-27 LAB — CULTURE, RESPIRATORY: GRAM STAIN: NONE SEEN

## 2014-07-27 LAB — CULTURE, RESPIRATORY W GRAM STAIN

## 2014-07-27 MED ORDER — INSULIN GLARGINE 100 UNIT/ML ~~LOC~~ SOLN
10.0000 [IU] | Freq: Once | SUBCUTANEOUS | Status: DC
  Filled 2014-07-27: qty 0.1

## 2014-07-27 MED ORDER — DEXMEDETOMIDINE HCL IN NACL 400 MCG/100ML IV SOLN
0.4000 ug/kg/h | INTRAVENOUS | Status: DC
  Administered 2014-07-28 (×2): 0.7 ug/kg/h via INTRAVENOUS
  Administered 2014-07-28: 0.5 ug/kg/h via INTRAVENOUS
  Administered 2014-07-29: 0.7 ug/kg/h via INTRAVENOUS
  Filled 2014-07-27 (×4): qty 100

## 2014-07-27 MED ORDER — PREDNISONE 5 MG/5ML PO SOLN
30.0000 mg | Freq: Every day | ORAL | Status: DC
  Administered 2014-07-28: 30 mg
  Filled 2014-07-27 (×2): qty 30

## 2014-07-27 MED ORDER — DEXMEDETOMIDINE HCL IN NACL 200 MCG/50ML IV SOLN
0.4000 ug/kg/h | INTRAVENOUS | Status: DC
  Administered 2014-07-27: 1.2 ug/kg/h via INTRAVENOUS
  Filled 2014-07-27: qty 50

## 2014-07-27 MED ORDER — NITROGLYCERIN 2 % TD OINT
1.0000 [in_us] | TOPICAL_OINTMENT | Freq: Three times a day (TID) | TRANSDERMAL | Status: DC
  Administered 2014-07-28 – 2014-08-05 (×24): 1 [in_us] via TOPICAL
  Filled 2014-07-27: qty 30

## 2014-07-27 MED ORDER — INFLUENZA VAC SPLIT QUAD 0.5 ML IM SUSY
0.5000 mL | PREFILLED_SYRINGE | INTRAMUSCULAR | Status: AC
  Administered 2014-07-28: 0.5 mL via INTRAMUSCULAR
  Filled 2014-07-27: qty 0.5

## 2014-07-27 MED ORDER — METOPROLOL TARTRATE 1 MG/ML IV SOLN
5.0000 mg | Freq: Four times a day (QID) | INTRAVENOUS | Status: DC
  Administered 2014-07-27 – 2014-07-28 (×3): 5 mg via INTRAVENOUS
  Filled 2014-07-27 (×7): qty 5

## 2014-07-27 MED ORDER — FUROSEMIDE 10 MG/ML IJ SOLN
40.0000 mg | Freq: Once | INTRAMUSCULAR | Status: AC
  Administered 2014-07-27: 40 mg via INTRAVENOUS

## 2014-07-27 MED ORDER — LORAZEPAM 2 MG/ML IJ SOLN
INTRAMUSCULAR | Status: AC
  Administered 2014-07-27: 0.5 mg
  Filled 2014-07-27: qty 1

## 2014-07-27 MED ORDER — DEXTROSE 5 % IV SOLN
INTRAVENOUS | Status: DC
  Administered 2014-07-27 – 2014-07-30 (×3): via INTRAVENOUS
  Administered 2014-07-31: 1000 mL via INTRAVENOUS
  Administered 2014-07-31 – 2014-08-01 (×2): via INTRAVENOUS
  Administered 2014-08-02: 75 mL via INTRAVENOUS
  Administered 2014-08-02: 06:00:00 via INTRAVENOUS

## 2014-07-27 MED ORDER — LEVOTHYROXINE SODIUM 100 MCG IV SOLR
25.0000 ug | Freq: Every day | INTRAVENOUS | Status: DC
  Administered 2014-07-28: 25 ug via INTRAVENOUS
  Filled 2014-07-27 (×2): qty 5

## 2014-07-27 MED ORDER — HYDRALAZINE HCL 20 MG/ML IJ SOLN
10.0000 mg | Freq: Three times a day (TID) | INTRAMUSCULAR | Status: DC
  Administered 2014-07-27 – 2014-07-29 (×7): 10 mg via INTRAVENOUS
  Filled 2014-07-27 (×4): qty 0.5
  Filled 2014-07-27: qty 1
  Filled 2014-07-27 (×2): qty 0.5
  Filled 2014-07-27 (×3): qty 1

## 2014-07-27 MED ORDER — ALBUTEROL SULFATE (2.5 MG/3ML) 0.083% IN NEBU: 2.5000 mg | INHALATION_SOLUTION | RESPIRATORY_TRACT | Status: DC | PRN

## 2014-07-27 MED ORDER — ALBUTEROL SULFATE HFA 108 (90 BASE) MCG/ACT IN AERS: 8.0000 | INHALATION_SPRAY | RESPIRATORY_TRACT | Status: DC | PRN

## 2014-07-27 MED ORDER — FENTANYL CITRATE 0.05 MG/ML IJ SOLN: 12.5000 ug | INTRAMUSCULAR | Status: DC | PRN

## 2014-07-27 MED ORDER — LORAZEPAM 2 MG/ML IJ SOLN: 0.5000 mg | INTRAMUSCULAR | Status: AC

## 2014-07-27 MED ORDER — INSULIN GLARGINE 100 UNIT/ML ~~LOC~~ SOLN
20.0000 [IU] | Freq: Every day | SUBCUTANEOUS | Status: DC
  Administered 2014-07-28 – 2014-07-30 (×3): 20 [IU] via SUBCUTANEOUS
  Filled 2014-07-27 (×4): qty 0.2

## 2014-07-27 MED ORDER — IPRATROPIUM-ALBUTEROL 0.5-2.5 (3) MG/3ML IN SOLN
3.0000 mL | Freq: Four times a day (QID) | RESPIRATORY_TRACT | Status: DC
  Administered 2014-07-27 – 2014-08-03 (×27): 3 mL via RESPIRATORY_TRACT
  Filled 2014-07-27 (×27): qty 3

## 2014-07-27 MED ORDER — PNEUMOCOCCAL VAC POLYVALENT 25 MCG/0.5ML IJ INJ
0.5000 mL | INJECTION | INTRAMUSCULAR | Status: AC
  Administered 2014-07-28: 0.5 mL via INTRAMUSCULAR
  Filled 2014-07-27: qty 0.5

## 2014-07-27 NOTE — Progress Notes (Signed)
PULMONARY / CRITICAL CARE MEDICINE   Name: [name] [name] MRN: [id number] DOB: 1941-10-23    ADMISSION DATE:  07/24/2014 CONSULTATION DATE:  07/27/2014  REFERRING MD :  Rhona Leavens  CHIEF COMPLAINT:  Acute respiratory distress  INITIAL PRESENTATION: [name] with COPD, CVA, CK D., Diabetes admitted 9/18 with acute respiratory distress , hypercarbic respiratory failure, DKA with glucose of 802 and anion gap of 19 , requiring mechanical ventilation. Chest x-ray showed left upper lobe infiltrate. 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  STUDIES:  9/18 head CT-no mass shift or bleed  SIGNIFICANT EVENTS: 9/18 intubated 9/19 off insulin gtt   SUBJECTIVE: no acute events  VITAL SIGNS: Temp:  [97.3 F (36.3 C)-98.6 F (37 C)] 97.5 F (36.4 C) (09/21 0759) Pulse Rate:  [52-89] 59 (09/21 0800) Resp:  [12-24] 12 (09/21 0800) BP: (117-192)/(45-106) 180/65 mmHg (09/21 0800) SpO2:  [100 %] 100 % (09/21 0800) FiO2 (%):  [40 %] 40 % (09/21 0800) Weight:  [77.8 kg (171 lb 8.3 oz)] 77.8 kg (171 lb 8.3 oz) (09/21 0400) HEMODYNAMICS: CVP:  [7 mmHg-9 mmHg] 8 mmHg VENTILATOR SETTINGS: Vent Mode:  [-] PRVC FiO2 (%):  [40 %] 40 % Set Rate:  [12 bmp] 12 bmp Vt Set:  [500 mL] 500 mL PEEP:  [5 cmH20] 5 cmH20 Plateau Pressure:  [19 cmH20-21 cmH20] 19 cmH20 INTAKE / OUTPUT:  Intake/Output Summary (Last 24 hours) at 07/27/14 1116 Last data filed at 07/27/14 1000  Gross per 24 hour  Intake 1466.5 ml  Output   1640 ml  Net -173.5 ml    PHYSICAL EXAMINATION:  Gen: awake on vent HEENT: NCAT, PERRL, ETT in place PULM: few crackles in bases CV: tachy, regular, no mgr, no JVD AB: BS+, soft, nontender Ext: warm, no edema, no clubbing, no cyanosis Derm: no  rash or skin breakdown Neuro: awake on vent, intermittently agitated   LABS:  CBC  Recent Labs Lab 07/25/14 0525 07/26/14 0410 07/27/14 0415  WBC 12.2* 12.1* 7.8  HGB 9.3* 10.4* 9.8*  HCT 28.3* 31.1* 30.8*  PLT 216 192 178   Coag's  Recent Labs Lab 07/24/14 1000  INR 1.14   BMET  Recent Labs Lab 07/25/14 1800 07/26/14 0410 07/27/14 0415  NA 146 145 151*  K 3.5* 3.5* 3.8  CL 102 104 110  CO2 31 30 33*  BUN 35* 39* 49*  CREATININE 1.64* 1.44* 1.25*  GLUCOSE 231* 198* 210*   Electrolytes  Recent Labs Lab 07/25/14 1800 07/26/14 0410 07/27/14 0415  CALCIUM 8.5 8.4 8.2*   Sepsis Markers  Recent Labs Lab 07/24/14 1022 07/24/14 1845  LATICACIDVEN 1.73 4.3*   ABG  Recent Labs Lab 07/24/14 1033 07/24/14 1307 07/25/14 0838  PHART 7.251* 7.291* 7.427  PCO2ART 72.7* 63.5* 48.3*  PO2ART 57.0* 175.0* 86.0   Liver Enzymes  Recent Labs Lab 07/24/14 1000 07/24/14 1950 07/25/14 0525  AST ALT ALKPHOS 91 73 62  BILITOT 0.3 0.3 0.3  ALBUMIN 3.3* 2.8* 2.6*   Cardiac Enzymes  Recent Labs Lab 07/24/14 1000 07/24/14 1950  TROPONINI <0.30 <0.30  PROBNP 6064.0*  --    Glucose  Recent Labs Lab 07/26/14 1109 07/26/14 1510 07/26/14 2008 07/26/14 2342 07/27/14 0440 07/27/14  0757  GLUCAP 407* 152* 109* 215* 189* 169*    Imaging Dg Chest Port 1 View  07/26/2014   CLINICAL DATA:  Ventilator dependent respiratory failure. Followup left upper lobe pneumonia.  EXAM: PORTABLE CHEST - 1 VIEW  COMPARISON:  Portable chest x-rays yesterday, 07/24/2014, 06/13/2014.  FINDINGS: Endotracheal tube tip in satisfactory position projecting approximately 4-5 cm above the carina. Nasogastric tube courses below the diaphragm into the stomach. Left jugular sent venous catheter tip projects over the upper SVC, unchanged.  Cardiac silhouette upper normal in size to slightly enlarged but stable. Airspace consolidation in the left upper lobe, slightly  worse than on yesterday's examination. No new pulmonary parenchymal abnormalities.  IMPRESSION: Support apparatus satisfactory. Slight worsening of left upper lobe pneumonia since yesterday. No new abnormalities.   Electronically Signed   By: Hulan Saas M.D.   On: 07/26/2014 09:10     ASSESSMENT / PLAN:  PULMONARY OETT 9/18>> 9/21 A:  Acute hypercarbic respiratory failure > resolved COPD not in exacerbation HCAP P:   Extubate today prednisone 40 daily- taper off this week DuoNeb's scheduled and prn albuterol  CARDIOVASCULAR CVL 9/18 L IJ >> A: Hypotension resolved TEE 03/2014- normal LV function Atrial flutter and prolonged QT on EKG P:  Continue Hydralazine (IV) Continue metoprolol since no bspasm (IV) Lasix x2 9/21  RENAL A:  AK I on CKD stage III Hypernatremia P:   Avoid nephrotoxins D5 IV at 50cc/hr Resumed lasix 40 IV> make BID  GASTROINTESTINAL A:  No issues P:   D/C Protonix NPO > SLP eval Change meds to IV  HEMATOLOGIC A:  No issues P:  SQheparin for DVT prophylaxis  INFECTIOUS A:  LUL HCAP, allergic to penicillin BCx2 9/18 >>1/2 coag neg staph UC 9/18 >>ng Sputum9/18 >>OPF P:   Abx: Aztreonam, start date= 9/18 plan 7 days  Vancomycin 9/18 >9/21  ENDOCRINE A:  DKA versus hyperosmolar > resolved  P:   Increase lantus 25  Continue SSE  NEUROLOGIC A:  Acute encephalopathy, related to hypercarbia and infection > resolved P:   Fentanyl when necessary Minimize sedating meds  TODAY'S SUMMARY: Acute hypercarbic respiratory failure and left upper lobe HCAP, GPC bacteremia requiring mechanical ventilation in this 73 year old woman with COPD and recent CVA  Recurent admits - d/w son, POA, will need NH placement  - husband cannot take care of her at home Extubate 9/21  I have personally obtained a history, examined the patient, evaluated laboratory and imaging results, formulated the assessment and plan and placed orders. CRITICAL CARE: The  patient is critically ill with multiple organ systems failure and requires high complexity decision making for assessment and support, frequent evaluation and titration of therapies, application of advanced monitoring technologies and extensive interpretation of multiple databases. Critical Care Time devoted to patient care services described in this note is 40 minutes.    Heber [place], MD [facility] Pager: [phone] Cell: [phone] If no response, call [phone]    07/27/2014, 11:16 AM

## 2014-07-27 NOTE — Progress Notes (Signed)
ANTIBIOTIC CONSULT NOTE - FOLLOW UP  Pharmacy Consult for Aztreonam Indication: rule out pneumonia  Allergies  Allergen Reactions  . Erythromycin Shortness Of Breath  . Iodine Hives  . Penicillins Hives    Patient Measurements: Height:  (160 cm) Weight: 171 lb 8.3 oz (77.8 kg) IBW/kg (Calculated) : 52.4 Vital Signs: Temp: 97.7 F (36.5 C) (09/21 1100) Temp src: Core (Comment) (09/21 1100) BP: 159/129 mmHg (09/21 1100) Pulse Rate: 78 (09/21 1100) Intake/Output from previous day: 09/20 0701 - 09/21 0700 In: 1704.5 [I.V.:274.5; NG/GT:930; IV Piggyback:500] Out: 1480 [Urine:1480] Intake/Output from this shift: Total I/O In: 871 [I.V.:81; NG/GT:460; IV Piggyback:330] Out: 395 [Urine:395] Labs:  Recent Labs  07/25/14 0525  07/25/14 1800 07/26/14 0410 07/27/14 0415  WBC 12.2*  --   --  12.1* 7.8  HGB 9.3*  --   --  10.4* 9.8*  PLT 216  --   --  192 178  CREATININE 1.92*  < > 1.64* 1.44* 1.25*  < > = values in this interval not displayed. Estimated Creatinine Clearance: 38.4 ml/min (by C-G formula based on Cr of 1.25). No results found for this basename: VANCOTROUGH, VANCOPEAK, VANCORANDOM, GENTTROUGH, GENTPEAK, GENTRANDOM, TOBRATROUGH, TOBRAPEAK, TOBRARND, AMIKACINPEAK, AMIKACINTROU, AMIKACIN,  in the last 72 hours   Microbiology: Recent Results (from the past 720 hour(s))  CULTURE, BLOOD (ROUTINE X 2)     Status: None   Collection Time    07/24/14 10:00 AM      Result Value Ref Range Status   Specimen Description BLOOD LEFT ANTECUBITAL   Final   Special Requests BOTTLES DRAWN AEROBIC AND ANAEROBIC   Final   Culture  Setup Time     Final   Value: 07/24/2014 14:55     Performed at [facility]   Culture     Final   Value: STAPHYLOCOCCUS SPECIES (COAGULASE NEGATIVE)     Note: THE SIGNIFICANCE OF ISOLATING THIS ORGANISM FROM A SINGLE SET OF BLOOD CULTURES WHEN MULTIPLE SETS ARE DRAWN IS UNCERTAIN. PLEASE NOTIFY THE MICROBIOLOGY DEPARTMENT WITHIN ONE  WEEK IF SPECIATION AND SENSITIVITIES ARE REQUIRED.     Note: Gram Stain Report Called to,Read Back By and Verified With: CELENIA SOSA @ 1043 ON [id number] BY [facility]     Performed at [facility]   Report Status 07/26/2014 FINAL   Final  CULTURE, BLOOD (ROUTINE X 2)     Status: None   Collection Time    07/24/14 10:10 AM      Result Value Ref Range Status   Specimen Description BLOOD RIGHT ANTECUBITAL   Final   Special Requests BOTTLES DRAWN AEROBIC AND ANAEROBIC   Final   Culture  Setup Time     Final   Value: 07/24/2014 14:55     Performed at [facility]   Culture     Final   Value:        BLOOD CULTURE RECEIVED NO GROWTH TO DATE CULTURE WILL BE HELD FOR 5 DAYS BEFORE ISSUING A FINAL NEGATIVE REPORT     Performed at [facility]   Report Status PENDING   Incomplete  URINE CULTURE     Status: None   Collection Time    07/24/14 12:50 PM      Result Value Ref Range Status   Specimen Description URINE, CATHETERIZED   Final   Special Requests NONE   Final   Culture  Setup Time     Final   Value: 07/24/2014 13:54  Performed at [facility] Count     Final   Value: NO GROWTH     Performed at [facility]   Culture     Final   Value: NO GROWTH     Performed at [facility]   Report Status 07/25/2014 FINAL   Final  MRSA PCR SCREENING     Status: None   Collection Time    07/24/14  6:06 PM      Result Value Ref Range Status   MRSA by PCR NEGATIVE  NEGATIVE Final   Comment:            The GeneXpert MRSA Assay (FDA     approved for NASAL specimens     only), is one component of a     comprehensive MRSA colonization     surveillance program. It is not     intended to diagnose MRSA     infection nor to guide or     monitor treatment for     MRSA infections.  CULTURE, RESPIRATORY (NON-EXPECTORATED)     Status: None   Collection Time    07/24/14  8:47 PM      Result Value Ref Range Status   Specimen Description TRACHEAL  ASPIRATE   Final   Special Requests NONE   Final   Gram Stain     Final   Value: NO WBC SEEN     RARE SQUAMOUS EPITHELIAL CELLS PRESENT     NO ORGANISMS SEEN     Performed at [facility]   Culture     Final   Value: Non-Pathogenic Oropharyngeal-type Flora Isolated.     Performed at [facility]   Report Status 07/27/2014 FINAL   Final    Anti-infectives   Start     Dose/Rate Route Frequency Ordered Stop   07/25/14 1000  vancomycin (VANCOCIN) 1,250 mg in sodium chloride 0.9 % 250 mL IVPB  Status:  Discontinued     1,250 mg 166.7 mL/hr over 90 Minutes Intravenous Every 24 hours 07/24/14 1124 07/27/14 1118   07/24/14 1800  aztreonam (AZACTAM) 1 g in dextrose 5 % 50 mL IVPB     1 g 100 mL/hr over 30 Minutes Intravenous Every 8 hours 07/24/14 0934 07/30/14 2359   07/24/14 0930  aztreonam (AZACTAM) 2 g in dextrose 5 % 50 mL IVPB     2 g 100 mL/hr over 30 Minutes Intravenous  Once 07/24/14 0927 07/24/14 1109   07/24/14 0930  vancomycin (VANCOCIN) IVPB 1000 mg/200 mL premix     1,000 mg 200 mL/hr over 60 Minutes Intravenous  Once 07/24/14 4098 07/24/14 1225     Assessment: 73 YO female on antibiotic day # 4 for HCAP. WBC is within normal limits and afebrile. SCr down to 1.25 (much improved), estimated CrCl ~ 38 mL/min. Urine output ~0.8 cc/kg/hr.  9/18 Vanc>>9/21 9/18 Aztreonam>>  9/18 blood>> 1/2 CNS prob contaminant 9/18 urine>>neg 9/18 Resp>>NPF  Goal of Therapy:  Clinical resolution of infection  Plan:  1. Continue Aztreonam at 1g IV q8h - stop date for 7 days in place.  Link Snuffer, PharmD, BCPS Clinical Pharmacist [phone] 07/27/2014,1:18 PM

## 2014-07-27 NOTE — Progress Notes (Signed)
Pt very lethargic at this time. Hard to awaken for treatment.

## 2014-07-27 NOTE — Procedures (Signed)
Extubation Procedure Note  Patient Details:   Name: [name] [name] DOB: 1941/07/22 MRN: [id number]   Airway Documentation:     Evaluation  O2 sats: stable throughout Complications: No apparent complications Patient did tolerate procedure well. Bilateral Breath Sounds: Clear Suctioning: Airway No  Patient extubated to 4L nasal cannula.  Positive cuff leak test.  No evidence of stridor.  Attempted to perform incentive spirometry but patient did not perform.  Sats currently 100%.    Durwin Glaze 07/27/2014, 12:23 PM

## 2014-07-27 NOTE — Care Management Note (Addendum)
    Page 1 of 1   08/03/2014     10:47:09 AM CARE MANAGEMENT NOTE 08/03/2014  Patient:  [name] [name], [name] [name]   Account Number:  [id number]  Date Initiated:  07/27/2014  Documentation initiated by:  Junius Creamer  Subjective/Objective Assessment:   adm w resp failure-vent     Action/Plan:   lives w husband, pcp dr Selena Batten [place]   Anticipated DC Date:     Anticipated DC Plan:           Choice offered to / List presented to:             Status of service:   Medicare Important Message given?  YES (If response is "NO", the following Medicare IM given date fields will be blank) Date Medicare IM given:  08/03/2014 Medicare IM given by:  Junius Creamer Date Additional Medicare IM given:  07/30/2014 Additional Medicare IM given by:  [facility] Azora Bonzo  Discharge Disposition:    Per UR Regulation:  Reviewed for med. necessity/level of care/duration of stay  If discussed at Long Length of Stay Meetings, dates discussed:   07/30/2014  08/04/2014    Comments:

## 2014-07-28 ENCOUNTER — Inpatient Hospital Stay (HOSPITAL_COMMUNITY): Payer: Medicare HMO

## 2014-07-28 DIAGNOSIS — J9600 Acute respiratory failure, unspecified whether with hypoxia or hypercapnia: Secondary | ICD-10-CM | POA: Diagnosis not present

## 2014-07-28 LAB — BASIC METABOLIC PANEL
Anion gap: 11 (ref 5–15)
BUN: 46 mg/dL — AB (ref 6–23)
CO2: 30 mEq/L (ref 19–32)
Calcium: 8.5 mg/dL (ref 8.4–10.5)
Chloride: 105 mEq/L (ref 96–112)
Creatinine, Ser: 1.26 mg/dL — ABNORMAL HIGH (ref 0.50–1.10)
GFR calc Af Amer: 47 mL/min — ABNORMAL LOW (ref >90)
GFR, EST NON AFRICAN AMERICAN: 41 mL/min — AB (ref >90)
Glucose, Bld: 111 mg/dL — ABNORMAL HIGH (ref 70–99)
POTASSIUM: 3.2 meq/L — AB (ref 3.7–5.3)
Sodium: 146 mEq/L (ref 137–147)

## 2014-07-28 LAB — GLUCOSE, CAPILLARY
GLUCOSE-CAPILLARY: 101 mg/dL — AB (ref 70–99)
GLUCOSE-CAPILLARY: 128 mg/dL — AB (ref 70–99)
GLUCOSE-CAPILLARY: 151 mg/dL — AB (ref 70–99)
GLUCOSE-CAPILLARY: 169 mg/dL — AB (ref 70–99)
Glucose-Capillary: 114 mg/dL — ABNORMAL HIGH (ref 70–99)
Glucose-Capillary: 199 mg/dL — ABNORMAL HIGH (ref 70–99)

## 2014-07-28 LAB — BLOOD GAS, ARTERIAL
Acid-Base Excess: 7.3 mmol/L — ABNORMAL HIGH (ref 0.0–2.0)
BICARBONATE: 30.7 meq/L — AB (ref 20.0–24.0)
Drawn by: 418751
FIO2: 0.4 %
MECHVT: 480 mL
O2 Saturation: 98.1 %
PCO2 ART: 38.5 mmHg (ref 35.0–45.0)
PEEP/CPAP: 5 cmH2O
PH ART: 7.513 — AB (ref 7.350–7.450)
Patient temperature: 98.6
RATE: 18 resp/min
TCO2: 31.9 mmol/L (ref 0–100)
pO2, Arterial: 78.8 mmHg — ABNORMAL LOW (ref 80.0–100.0)

## 2014-07-28 MED ORDER — CETYLPYRIDINIUM CHLORIDE 0.05 % MT LIQD
7.0000 mL | Freq: Four times a day (QID) | OROMUCOSAL | Status: DC
  Administered 2014-07-28 – 2014-07-29 (×5): 7 mL via OROMUCOSAL

## 2014-07-28 MED ORDER — CHLORHEXIDINE GLUCONATE 0.12 % MT SOLN: 15.0000 mL | Freq: Two times a day (BID) | OROMUCOSAL | Status: DC

## 2014-07-28 MED ORDER — FENTANYL CITRATE 0.05 MG/ML IJ SOLN
INTRAMUSCULAR | Status: AC
  Filled 2014-07-28: qty 2

## 2014-07-28 MED ORDER — PANTOPRAZOLE SODIUM 40 MG IV SOLR
40.0000 mg | Freq: Every day | INTRAVENOUS | Status: DC
  Administered 2014-07-28 – 2014-07-30 (×3): 40 mg via INTRAVENOUS
  Filled 2014-07-28 (×3): qty 40

## 2014-07-28 MED ORDER — FENTANYL CITRATE 0.05 MG/ML IJ SOLN
100.0000 ug | Freq: Once | INTRAMUSCULAR | Status: AC
  Administered 2014-07-28: 100 ug via INTRAVENOUS

## 2014-07-28 MED ORDER — CHLORHEXIDINE GLUCONATE 0.12 % MT SOLN
15.0000 mL | Freq: Two times a day (BID) | OROMUCOSAL | Status: DC
  Administered 2014-07-28 – 2014-07-30 (×5): 15 mL via OROMUCOSAL
  Filled 2014-07-28 (×6): qty 15

## 2014-07-28 MED ORDER — POTASSIUM CHLORIDE 20 MEQ/15ML (10%) PO LIQD
40.0000 meq | Freq: Once | ORAL | Status: AC
  Administered 2014-07-28: 40 meq
  Filled 2014-07-28: qty 30

## 2014-07-28 MED ORDER — ATORVASTATIN CALCIUM 40 MG PO TABS
40.0000 mg | ORAL_TABLET | Freq: Every day | ORAL | Status: DC
  Administered 2014-07-28 – 2014-08-04 (×6): 40 mg via ORAL
  Filled 2014-07-28 (×9): qty 1

## 2014-07-28 MED ORDER — ROCURONIUM BROMIDE 50 MG/5ML IV SOLN
1.0000 mg/kg | Freq: Once | INTRAVENOUS | Status: AC
  Administered 2014-07-28: 50 mg via INTRAVENOUS

## 2014-07-28 MED ORDER — MIDAZOLAM HCL 2 MG/2ML IJ SOLN
2.0000 mg | Freq: Once | INTRAMUSCULAR | Status: AC
  Administered 2014-07-28: 2 mg via INTRAVENOUS

## 2014-07-28 MED ORDER — PREDNISONE 5 MG/5ML PO SOLN
20.0000 mg | Freq: Every day | ORAL | Status: DC
  Administered 2014-07-29 – 2014-08-02 (×2): 20 mg
  Filled 2014-07-28 (×7): qty 20

## 2014-07-28 MED ORDER — CARVEDILOL 12.5 MG PO TABS
12.5000 mg | ORAL_TABLET | Freq: Two times a day (BID) | ORAL | Status: DC
  Administered 2014-07-28 – 2014-07-30 (×4): 12.5 mg via ORAL
  Filled 2014-07-28 (×9): qty 1

## 2014-07-28 MED ORDER — CETYLPYRIDINIUM CHLORIDE 0.05 % MT LIQD: 7.0000 mL | Freq: Four times a day (QID) | OROMUCOSAL | Status: DC

## 2014-07-28 MED ORDER — ETOMIDATE 2 MG/ML IV SOLN
0.3000 mg/kg | Freq: Once | INTRAVENOUS | Status: AC
  Administered 2014-07-28: 20 mg via INTRAVENOUS

## 2014-07-28 MED ORDER — MIDAZOLAM HCL 2 MG/2ML IJ SOLN
INTRAMUSCULAR | Status: AC
  Filled 2014-07-28: qty 2

## 2014-07-28 MED ORDER — LEVOTHYROXINE SODIUM 50 MCG PO TABS
50.0000 ug | ORAL_TABLET | Freq: Every day | ORAL | Status: DC
Start: 2014-07-29 — End: 2014-08-05
  Administered 2014-07-29 – 2014-08-05 (×5): 50 ug via ORAL
  Filled 2014-07-28 (×9): qty 1

## 2014-07-28 NOTE — Progress Notes (Signed)
[facility] PCCM  Discussed situation with son.  He states that his mother was conversant after her stroke and was doing fairly well.  He understands her prognosis is guarded but would want to proceed with tracheostomy if needed.    Will make arrangements for trach tomorrow.  Heber [place], MD [facility] Pager: [phone] Cell: [phone] If no response, call [phone]

## 2014-07-28 NOTE — Progress Notes (Signed)
[facility] P[facility] PROGRESS NOTE  Called to bedside by [facility] MD to evaluate patient due to AMS. She was intubated 9/21 and had initially tolerated it well. RN reports that she has been pooling secretions and has not been able to cough effectively enough to clear and protect her airway. Upon my arrival patient was alert, but was not responding to questions appropriately. She would attempt to cough on command, but cough was unfortunately ineffective.   Assessment: -Acute on chronic respiratory failure  Plan: -Intubate -Full vent support -Follow CXR to evaluate tube placement -Check ABG 30 mins post intubation.  -Daily SBT/WUA -Propofol gtt for sedation per PAD protocol   Additional CC time of 30 min.  Patient seen and examined, agree with above note.  I dictated the care and orders written for this patient under my direction.  Alyson Reedy, MD [phone]

## 2014-07-28 NOTE — Progress Notes (Signed)
PT Cancellation Note  Patient Details Name: [name] [name] MRN: [id number] DOB: 1941-04-10   Cancelled Treatment:    Reason Eval/Treat Not Completed: Medical issues which prohibited therapy (Pt intubated this am.  Nursing to assess pt to determine readiness for PT).  Thanks.   [name],[name] Ode 07/28/2014, 9:49 AM  [facility] [phone] (pager)

## 2014-07-28 NOTE — Procedures (Signed)
Intubation Procedure Note [name] [name] [id number] Apr 27, 1941  Procedure: Intubation Indications: Airway protection and maintenance  Procedure Details Consent: Unable to obtain consent because of altered level of consciousness. Time Out: Verified patient identification, verified procedure, site/side was marked, verified correct patient position, special equipment/implants available, medications/allergies/relevent history reviewed, required imaging and test results available.  Performed  Maximum sterile technique was used including hand hygiene.  MAC and 3    Evaluation Hemodynamic Status: BP stable throughout; O2 sats: stable throughout Patient's Current Condition: stable Complications: No apparent complications Patient did tolerate procedure well. Chest X-ray ordered to verify placement.  CXR: pending.   Joneen Roach, ACNP [facility] Pager [phone] or [phone]  I was present and supervised the entire procedure.  Alyson Reedy, M.D. [facility]/Critical [facility] Medicine. Pager: [phone]. After hours pager: [phone].

## 2014-07-28 NOTE — Progress Notes (Signed)
SLP Cancellation Note  Patient Details Name: [name] [name] MRN: [id number] DOB: 1941/09/26   Cancelled treatment:        Received order for Clinical Swallow evaluation.  Pt was re-intubated this am.  Please reorder ST when appropriate.   Maryjo Rochester T 07/28/2014, 11:40 AM

## 2014-07-28 NOTE — Evaluation (Signed)
Physical Therapy Evaluation Patient Details Name: [name] [name] MRN: [id number] DOB: Dec 06, 1940 Today's Date: 07/28/2014   History of Present Illness    [name] with COPD, CVA, CK D., Diabetes admitted 9/18 with acute respiratory distress , hypercarbic respiratory failure, DKA with glucose of 802 and anion gap of 19 , requiring mechanical ventilation.  Chest x-ray showed left upper lobe infiltrate. 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 Pt currently with functional limitations due to the deficits listed below (see PT Problem List). Limited eval due to bed level eval at present.   Pt will benefit from skilled PT to increase their independence and safety with mobility to allow discharge to the venue listed below.     Follow Up Recommendations Home health PT;Supervision/Assistance - 24 hour    Equipment Recommendations  Other (comment) (TBA)    Recommendations for Other Services       Precautions / Restrictions Precautions Precautions: Fall Restrictions Weight Bearing Restrictions: No      Mobility  Bed Mobility               General bed mobility comments: BEd level eval per nursing.    Transfers                    Ambulation/Gait                Stairs            Wheelchair Mobility    Modified Rankin (Stroke Patients Only)       Balance                                             Pertinent Vitals/Pain  No pain c/o.  HR 71 bpm, 18, 100% O2 sat, 161/52, 40% FiO2 with 5PEEP.      Home Living Family/patient expects to be discharged to:: Private residence Living Arrangements: Spouse/significant other;Children Available Help at  Discharge: Available 24 hours/day;Family Type of Home: House Home Access: Ramped entrance     Home Layout: One level Home Equipment: Environmental consultant - 2 wheels;Tub bench;Bedside commode      Prior Function Level of Independence: Needs assistance   Gait / Transfers Assistance Needed: Assist to transfer and for ambulation with RW  ADL's / Homemaking Assistance Needed: Assist for bathing/dressing, housekeeping, and meal prep.   Comments: Patient legally blind     Hand Dominance   Dominant Hand: Right    Extremity/Trunk Assessment   Upper Extremity Assessment: Defer to OT evaluation           Lower Extremity Assessment: Generalized weakness         Communication   Communication: Expressive difficulties;HOH  Cognition Arousal/Alertness: Lethargic;Suspect due to medications Behavior During Therapy: Anxious;Restless Overall Cognitive Status: Impaired/Different from baseline Area of Impairment: Orientation;Following commands;Safety/judgement;Awareness;Problem solving Orientation Level: Disoriented to;Place;Time;Situation     Following Commands: Follows one step commands with increased time;Follows one step commands inconsistently Safety/Judgement: Decreased awareness of safety;Decreased awareness of deficits   Problem Solving: Slow processing;Decreased initiation;Requires verbal cues;Requires tactile cues      General  Comments General comments (skin integrity, edema, etc.): wrist restraints and hand restraints.      Exercises General Exercises - Upper Extremity Shoulder Flexion: AROM;Both;5 reps;Supine Shoulder ABduction: AROM;Both;5 reps;Supine Elbow Flexion: AROM;Both;5 reps;Supine Wrist Flexion: AROM;Both;10 reps;Supine General Exercises - Lower Extremity Ankle Circles/Pumps: AROM;Both;10 reps;Supine Gluteal Sets: AROM;Both;10 reps;Supine Heel Slides: AROM;Both;10 reps;Supine Hip ABduction/ADduction: AROM;Both;10 reps;Supine      Assessment/Plan    PT  Assessment Patient needs continued PT services  PT Diagnosis Generalized weakness   PT Problem List Decreased balance;Decreased activity tolerance;Decreased mobility;Decreased knowledge of use of DME;Decreased safety awareness;Decreased knowledge of precautions  PT Treatment Interventions DME instruction;Gait training;Functional mobility training;Therapeutic activities;Therapeutic exercise;Balance training;Patient/family education   PT Goals (Current goals can be found in the Care Plan section) Acute Rehab PT Goals Patient Stated Goal: unable PT Goal Formulation: Patient unable to participate in goal setting Time For Goal Achievement: 08/04/14 Potential to Achieve Goals: Good    Frequency Min 3X/week   Barriers to discharge        Co-evaluation               End of Session Equipment Utilized During Treatment: Gait belt;Oxygen (ventilator) Activity Tolerance: Patient limited by fatigue Patient left: in bed;with call bell/phone within reach;with restraints reapplied Nurse Communication: Mobility status;Need for lift equipment         Time: 1050-1100 PT Time Calculation (min): 10 min   Charges:   PT Evaluation $Initial PT Evaluation Tier I: 1 Procedure PT Treatments $Therapeutic Exercise: 8-22 mins   PT G Codes:          [name],[name] Servantes August 01, 2014, 12:11 PM [facility] [phone] (pager)

## 2014-07-28 NOTE — Procedures (Signed)
Intubation Procedure Note [name] [name] [id number] [birth year]  Procedure: Intubation Indications: Airway protection and maintenance  Procedure Details Consent: Unable to obtain consent because of altered level of consciousness. Time Out: Verified patient identification, verified procedure, site/side was marked, verified correct patient position, special equipment/implants available, medications/allergies/relevent history reviewed, required imaging and test results available.  Performed  Maximum sterile technique was used including antiseptics, cap, gloves, gown, hand hygiene and mask.  MAC and 4    Evaluation Hemodynamic Status: BP stable throughout; O2 sats: stable throughout Patient's Current Condition: stable Complications: No apparent complications Patient did tolerate procedure well. Chest X-ray ordered to verify placement.  CXR: tube position low-repostitioned.   Tube was originally at 26 at the lip and was repositioned due to CXR to 24 at the lip. [name] [name] intubated with glidescope with one attempt using 7.5 ETT. Pt is now resting comfortably.    Tacy Learn 07/28/2014

## 2014-07-28 NOTE — Progress Notes (Signed)
PULMONARY / CRITICAL CARE MEDICINE   Name: [name] [name] MRN: [id number] DOB: 1941-01-24    ADMISSION DATE:  07/24/2014 CONSULTATION DATE:  07/28/2014  REFERRING MD :  Rhona Leavens  CHIEF COMPLAINT:  Acute respiratory distress  INITIAL PRESENTATION: [name] with COPD, CVA, CK D., Diabetes admitted 9/18 with acute respiratory distress , hypercarbic respiratory failure, DKA with glucose of 802 and anion gap of 19 , requiring mechanical ventilation. Chest x-ray showed left upper lobe infiltrate. 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  STUDIES:  9/18 head CT-no mass shift or bleed  SIGNIFICANT EVENTS: 9/18 intubated 9/19 off insulin gtt 9/21 extubated 9/22 re-intubated for poor mental status    SUBJECTIVE:  re-intubated overnight for inability to protect airway  VITAL SIGNS: Temp:  [97.3 F (36.3 C)-98.5 F (36.9 C)] 98.5 F (36.9 C) (09/22 0744) Pulse Rate:  [60-105] 77 (09/22 0732) Resp:  [16-33] 18 (09/22 0732) BP: (99-247)/(38-129) 129/51 mmHg (09/22 0732) SpO2:  [100 %] 100 % (09/22 0744) FiO2 (%):  [2 %-40 %] 40 % (09/22 0744) Weight:  [76.6 kg (168 lb 14 oz)] 76.6 kg (168 lb 14 oz) (09/22 0400) HEMODYNAMICS: CVP:  [7 mmHg-9 mmHg] 7 mmHg VENTILATOR SETTINGS: Vent Mode:  [-] PRVC FiO2 (%):  [2 %-40 %] 40 % Set Rate:  [18 bmp] 18 bmp Vt Set:  [480 mL] 480 mL PEEP:  [5 cmH20] 5 cmH20 Plateau Pressure:  [17 cmH20] 17 cmH20 INTAKE / OUTPUT:  Intake/Output Summary (Last 24 hours) at 07/28/14 1026 Last data filed at 07/28/14 0634  Gross per 24 hour  Intake 1582.5 ml  Output   2610 ml  Net -1027.5 ml    PHYSICAL EXAMINATION:  Gen: awake on vent HEENT: NCAT, PERRL, ETT in place PULM: few crackles in bases CV: tachy, regular,  no mgr, no JVD AB: BS+, soft, nontender Ext: warm, no edema, no clubbing, no cyanosis Derm: no rash or skin breakdown Neuro: awake on vent, calm   LABS:  CBC  Recent Labs Lab 07/25/14 0525 07/26/14 0410 07/27/14 0415  WBC 12.2* 12.1* 7.8  HGB 9.3* 10.4* 9.8*  HCT 28.3* 31.1* 30.8*  PLT 216 192 178   Coag's  Recent Labs Lab 07/24/14 1000  INR 1.14   BMET  Recent Labs Lab 07/26/14 0410 07/27/14 0415 07/28/14 0425  NA 145 151* 146  K 3.5* 3.8 3.2*  CL 104 110 105  CO2 30 33* 30  BUN 39* 49* 46*  CREATININE 1.44* 1.25* 1.26*  GLUCOSE 198* 210* 111*   Electrolytes  Recent Labs Lab 07/26/14 0410 07/27/14 0415 07/28/14 0425  CALCIUM 8.4 8.2* 8.5   Sepsis Markers  Recent Labs Lab 07/24/14 1022 07/24/14 1845  LATICACIDVEN 1.73 4.3*   ABG  Recent Labs Lab 07/24/14 1307 07/25/14 0838 07/28/14 0250  PHART 7.291* 7.427 7.513*  PCO2ART 63.5* 48.3* 38.5  PO2ART 175.0* 86.0 78.8*   Liver Enzymes  Recent Labs Lab 07/24/14 1000 07/24/14 1950 07/25/14 0525  AST ALT ALKPHOS 91 73 62  BILITOT 0.3 0.3 0.3  ALBUMIN 3.3* 2.8* 2.6*   Cardiac Enzymes  Recent Labs Lab 07/24/14 1000 07/24/14 1950  TROPONINI <0.30 <0.30  PROBNP 6064.0*  --    Glucose  Recent Labs Lab 07/27/14 1108 07/27/14 1526 07/27/14 2005 07/27/14 2357 07/28/14 0420 07/28/14 0850  GLUCAP 328* 277* 206* 101* 114* 128*    Imaging Dg Chest Port 1 View  07/27/2014   CLINICAL DATA:  Pneumonitis.  EXAM: PORTABLE CHEST - 1 VIEW  COMPARISON:  07/26/2014.  FINDINGS: Endotracheal tube ends between the clavicular heads and carina. Left IJ catheter is likely stable in positioning, although there is new distortion from leftward rotation. Orogastric tube enters the stomach at least.  There is no change in heart size and mediastinal contours when accounting for rotation. Implantable cardiac monitor has a stable orientation.  Worsening basilar lung aeration. Hazy  appearance favors atelectasis and possibly small pleural effusion. There is no edema or pneumothorax.  IMPRESSION: 1. Stable positioning of tubes and central line. 2. Significant improvement and left upper lobe aeration. Rapid clearing favors pneumonitis or atelectasis over pneumonia. 3. New bibasilar atelectasis.   Electronically Signed   By: Tiburcio Pea M.D.   On: 07/27/2014 06:25     ASSESSMENT / PLAN:  PULMONARY OETT 9/18>> 9/21; re-intubated for airway protection A:  Acute hypercarbic respiratory failure > initially extubated but could not protect airway so re-intubated COPD not in exacerbation HCAP P:   Full vent support VAP bundle Consider tracheostomy prednisone 30 daily- taper off this week DuoNeb's scheduled and prn albuterol  CARDIOVASCULAR CVL 9/18 L IJ >> A: Hypotension resolved TEE 03/2014- normal LV function Atrial flutter and prolonged QT on EKG P:  Continue Hydralazine (IV) Continue metoprolol since no bspasm (IV) Keep ne  RENAL A:  AK I on CKD stage III Hypernatremia P:   Avoid nephrotoxins D5 IV at 50cc/hr Resumed lasix 40 IV> make BID  GASTROINTESTINAL A:  No issues P:   D/C Protonix NPO > SLP eval Change meds to IV  HEMATOLOGIC A:  No issues P:  SQheparin for DVT prophylaxis  INFECTIOUS A:  LUL HCAP, allergic to penicillin BCx2 9/18 >>1/2 coag neg staph UC 9/18 >>ng Sputum9/18 >>OPF P:   Abx: Aztreonam, start date= 9/18 plan 7 days  Vancomycin 9/18 >9/21  ENDOCRINE A:  DKA versus hyperosmolar > resolved  P:   Continue lantus at current dosing Continue SSI  NEUROLOGIC A:  Acute encephalopathy, related to hypercarbia and infection > initially resolved but now with persistent delirium requiring re-intubation, no clear cause of encephalopathy other than delirium from old stroke, being in ICU etc P:   Fentanyl when necessary Minimize sedating meds  TODAY'S SUMMARY: re-intubated for delirium, inability to protect airway, will  discuss goals of care with family today> trach?  I have personally obtained a history, examined the patient, evaluated laboratory and imaging results, formulated the assessment and plan and placed orders. CRITICAL CARE: The patient is critically ill with multiple organ systems failure and requires high complexity decision making for assessment and support, frequent evaluation and titration of therapies, application of advanced monitoring technologies and extensive interpretation of multiple databases. Critical Care Time devoted to patient care services described in this note is 40 minutes.    Heber [place], MD [place] [facility] Pager: [phone] Cell: [phone] If no response, call [phone]    07/28/2014, 10:26 AM

## 2014-07-29 ENCOUNTER — Encounter (HOSPITAL_COMMUNITY): Payer: Self-pay | Admitting: *Deleted

## 2014-07-29 ENCOUNTER — Inpatient Hospital Stay (HOSPITAL_COMMUNITY): Payer: Medicare HMO

## 2014-07-29 ENCOUNTER — Encounter (HOSPITAL_COMMUNITY): Payer: Self-pay

## 2014-07-29 ENCOUNTER — Encounter: Payer: Self-pay | Admitting: Internal Medicine

## 2014-07-29 LAB — CBC
HEMATOCRIT: 28.8 % — AB (ref 36.0–46.0)
Hemoglobin: 9.5 g/dL — ABNORMAL LOW (ref 12.0–15.0)
MCH: 29.4 pg (ref 26.0–34.0)
MCHC: 33 g/dL (ref 30.0–36.0)
MCV: 89.2 fL (ref 78.0–100.0)
Platelets: 173 10*3/uL (ref 150–400)
RBC: 3.23 MIL/uL — ABNORMAL LOW (ref 3.87–5.11)
RDW: 13.8 % (ref 11.5–15.5)
WBC: 7.1 10*3/uL (ref 4.0–10.5)

## 2014-07-29 LAB — GLUCOSE, CAPILLARY
GLUCOSE-CAPILLARY: 156 mg/dL — AB (ref 70–99)
GLUCOSE-CAPILLARY: 162 mg/dL — AB (ref 70–99)
GLUCOSE-CAPILLARY: 208 mg/dL — AB (ref 70–99)
Glucose-Capillary: 147 mg/dL — ABNORMAL HIGH (ref 70–99)
Glucose-Capillary: 252 mg/dL — ABNORMAL HIGH (ref 70–99)
Glucose-Capillary: 245 mg/dL — ABNORMAL HIGH (ref 70–99)

## 2014-07-29 LAB — BASIC METABOLIC PANEL
ANION GAP: 11 (ref 5–15)
BUN: 47 mg/dL — ABNORMAL HIGH (ref 6–23)
CALCIUM: 8.3 mg/dL — AB (ref 8.4–10.5)
CO2: 29 meq/L (ref 19–32)
CREATININE: 1.3 mg/dL — AB (ref 0.50–1.10)
Chloride: 108 mEq/L (ref 96–112)
GFR, EST AFRICAN AMERICAN: 45 mL/min — AB (ref >90)
GFR, EST NON AFRICAN AMERICAN: 39 mL/min — AB (ref >90)
Glucose, Bld: 156 mg/dL — ABNORMAL HIGH (ref 70–99)
Potassium: 3 mEq/L — ABNORMAL LOW (ref 3.7–5.3)
SODIUM: 148 meq/L — AB (ref 137–147)

## 2014-07-29 LAB — MAGNESIUM: Magnesium: 2 mg/dL (ref 1.5–2.5)

## 2014-07-29 MED ORDER — METOPROLOL TARTRATE 1 MG/ML IV SOLN
2.5000 mg | INTRAVENOUS | Status: DC | PRN
  Administered 2014-07-29 – 2014-07-30 (×4): 5 mg via INTRAVENOUS
  Filled 2014-07-29 (×3): qty 5

## 2014-07-29 MED ORDER — CETYLPYRIDINIUM CHLORIDE 0.05 % MT LIQD
7.0000 mL | Freq: Two times a day (BID) | OROMUCOSAL | Status: DC
  Administered 2014-07-29 – 2014-07-30 (×4): 7 mL via OROMUCOSAL

## 2014-07-29 MED ORDER — CHLORHEXIDINE GLUCONATE 0.12 % MT SOLN: 15.0000 mL | Freq: Two times a day (BID) | OROMUCOSAL | Status: DC

## 2014-07-29 MED ORDER — HALOPERIDOL LACTATE 5 MG/ML IJ SOLN
1.0000 mg | INTRAMUSCULAR | Status: DC | PRN
  Administered 2014-07-30 – 2014-07-31 (×5): 4 mg via INTRAVENOUS
  Filled 2014-07-29 (×6): qty 1

## 2014-07-29 MED ORDER — POTASSIUM CHLORIDE 20 MEQ/15ML (10%) PO LIQD
40.0000 meq | ORAL | Status: AC
  Administered 2014-07-29: 40 meq
  Filled 2014-07-29: qty 30

## 2014-07-29 MED ORDER — FENTANYL CITRATE 0.05 MG/ML IJ SOLN: 12.5000 ug | INTRAMUSCULAR | Status: DC | PRN

## 2014-07-29 NOTE — Progress Notes (Signed)
PULMONARY / CRITICAL CARE MEDICINE   Name: [name] [name] MRN: [id number] DOB: 12-Dec-1940    ADMISSION DATE:  07/24/2014 CONSULTATION DATE:  07/29/2014  REFERRING MD :  Rhona Leavens  CHIEF COMPLAINT:  Acute respiratory distress  INITIAL PRESENTATION: [name] with COPD, CVA, CK D., Diabetes admitted 9/18 with acute respiratory distress , hypercarbic respiratory failure, DKA with glucose of 802 and anion gap of 19 , requiring mechanical ventilation. Chest x-ray showed left upper lobe infiltrate. 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  STUDIES:  9/18 head CT-no mass shift or bleed  SIGNIFICANT EVENTS: 9/18 intubated 9/19 off insulin gtt 9/21 extubated 9/22 re-intubated for poor mental status    SUBJECTIVE:  Sitting up on side of bed with pt, requesting to sit in chair with PT, mental status better, hypok, brady episode on precedex  VITAL SIGNS: Temp:  [97.9 F (36.6 C)-98.5 F (36.9 C)] 97.9 F (36.6 C) (09/23 0830) Pulse Rate:  [52-98] 61 (09/23 0800) Resp:  [0-22] 11 (09/23 0800) BP: (121-190)/(45-65) 160/62 mmHg (09/23 0800) SpO2:  [98 %-100 %] 100 % (09/23 0800) FiO2 (%):  [40 %] 40 % (09/23 0740) Weight:  [78.1 kg (172 lb 2.9 oz)] 78.1 kg (172 lb 2.9 oz) (09/23 0400) HEMODYNAMICS: CVP:  [6 mmHg-15 mmHg] 6 mmHg VENTILATOR SETTINGS: Vent Mode:  [-] PSV;CPAP FiO2 (%):  [40 %] 40 % Set Rate:  [18 bmp] 18 bmp Vt Set:  [480 mL] 480 mL PEEP:  [5 cmH20] 5 cmH20 Pressure Support:  [10 cmH20] 10 cmH20 Plateau Pressure:  [18 cmH20-21 cmH20] 18 cmH20 INTAKE / OUTPUT:  Intake/Output Summary (Last 24 hours) at 07/29/14 1037 Last data filed at 07/29/14 0800  Gross per 24 hour  Intake 2644.3 ml  Output   1290 ml  Net 1354.3 ml    PHYSICAL  EXAMINATION:  Gen: awake on vent HEENT: NCAT, PERRL, ETT in place PULM: few crackles in bases CV: tachy, regular, no mgr, no JVD AB: BS+, soft, nontender Ext: warm, no edema, no clubbing, no cyanosis Derm: no rash or skin breakdown Neuro: follows commands,    LABS:  CBC  Recent Labs Lab 07/26/14 0410 07/27/14 0415 07/29/14 0409  WBC 12.1* 7.8 7.1  HGB 10.4* 9.8* 9.5*  HCT 31.1* 30.8* 28.8*  PLT 192 178 173   Coag's  Recent Labs Lab 07/24/14 1000  INR 1.14   BMET  Recent Labs Lab 07/27/14 0415 07/28/14 0425 07/29/14 0409  NA 151* 146 148*  K 3.8 3.2* 3.0*  CL 110 105 108  CO2 33* 30 29  BUN 49* 46* 47*  CREATININE 1.25* 1.26* 1.30*  GLUCOSE 210* 111* 156*   Electrolytes  Recent Labs Lab 07/27/14 0415 07/28/14 0425 07/29/14 0409  CALCIUM 8.2* 8.5 8.3*   Sepsis Markers  Recent Labs Lab 07/24/14 1022 07/24/14 1845  LATICACIDVEN 1.73 4.3*   ABG  Recent Labs Lab 07/24/14 1307 07/25/14 0838 07/28/14 0250  PHART 7.291* 7.427 7.513*  PCO2ART 63.5* 48.3* 38.5  PO2ART 175.0* 86.0 78.8*   Liver Enzymes  Recent Labs Lab 07/24/14 1000 07/24/14 1950 07/25/14 0525  AST ALT ALKPHOS 91 73 62  BILITOT 0.3 0.3 0.3  ALBUMIN 3.3* 2.8* 2.6*   Cardiac Enzymes  Recent Labs Lab 07/24/14 1000 07/24/14 1950  TROPONINI <0.30 <0.30  PROBNP 6064.0*  --    Glucose  Recent Labs Lab 07/28/14 1158 07/28/14 1530 07/28/14 1938 07/29/14 07/29/14 0409 07/29/14 0829  GLUCAP 169* 151* 199* 156* 147* 208*    Imaging Dg Chest Portable 1 View  07/28/2014   CLINICAL DATA:  Endotracheal tube and orogastric tube placement  EXAM: PORTABLE CHEST - 1 VIEW  COMPARISON:  07/27/2014  FINDINGS: Endotracheal tube is seen with tip directly at the carina directed toward left main bronchus. Left internal jugular central line is seen with tip over the junction of the brachiocephalic vein and superior vena cava. There is no pneumothorax. NG tube  crosses the gastroesophageal junction.  Right lung is clear. Partial consolidation left lower lobe most consistent with partial atelectasis, improved when compared to the prior study. When compared to prior study there is improved aeration in the left lower lobe.  IMPRESSION: Endotracheal tube at the carina. Consider retracting the tube 2-3 cm and repeating the image. Continued atelectasis left lower lobe, but improved when compared to prior study. These results were called by telephone at the time of interpretation on 07/28/2014 at 2:23 am to Joy, the patient's charge nurse, who verbally acknowledged these results.   Electronically Signed   By: Esperanza Heir M.D.   On: 07/28/2014 02:23   Dg Abd Portable 1v  07/28/2014   CLINICAL DATA:  Endotracheal tube and orogastric tube placement  EXAM: PORTABLE ABDOMEN - 1 VIEW  COMPARISON:  None.  FINDINGS: Orogastric tube crosses the gastroesophageal junction with tip in the left upper quadrant in the anticipated position of stomach. Endotracheal tube is also seen on this study with tip about 12 mm above the carinal.  IMPRESSION: Endotracheal tube and OG tube as described.   Electronically Signed   By: Esperanza Heir M.D.   On: 07/28/2014 02:24     ASSESSMENT / PLAN:  PULMONARY OETT 9/18>> 9/21; re-intubated for airway protection A:  Acute hypercarbic respiratory failure > more awake and alert COPD not in exacerbation HCAP P:   Will re-attempt extubation today as mental status improved If re-intubated then trach Minimize sedating medications  CARDIOVASCULAR CVL 9/18 L IJ >> A: Hypotension resolved TEE 03/2014- normal LV function Atrial flutter and prolonged QT on EKG Bradycardia 9/23 AM on precedex P:  Stop precedex Continue Hydralazine (IV) Continue metoprolol since no bspasm (IV) Keep net even (lasix x1)  RENAL A:  AK I on CKD stage III Hypernatremia P:   Avoid nephrotoxins D5 IV at 50cc/hr Lasix x1 today  GASTROINTESTINAL A:  No  issues P:   D/C Protonix NPO until SLP eval Continue IV  HEMATOLOGIC A:  No issues P:  SQ heparin for DVT prophylaxis  INFECTIOUS A:  LUL HCAP, allergic to penicillin BCx2 9/18 >>1/2 coag neg staph UC 9/18 >>ng Sputum9/18 >>OPF P:   Abx: Aztreonam, start date= 9/18 plan 7 days  Vancomycin 9/18 >9/21  ENDOCRINE A:  DKA versus hyperosmolar > resolved  P:   Continue lantus at current dosing Continue SSI  NEUROLOGIC A:  Acute encephalopathy, related to hypercarbia and infection and sedating medications, now improved P:   Fentanyl when necessary Minimize sedating meds Frequent orientation Haldol (not ativan) prn  TODAY'S SUMMARY: mental status improved, will attempt extubation again today  I have personally obtained a history, examined the patient, evaluated laboratory and imaging results, formulated the assessment and plan and placed orders. CRITICAL CARE: The patient is critically ill  with multiple organ systems failure and requires high complexity decision making for assessment and support, frequent evaluation and titration of therapies, application of advanced monitoring technologies and extensive interpretation of multiple databases. Critical Care Time devoted to patient care services described in this note is 40 minutes.    Heber Gilson, MD [place] [facility] Pager: [phone] Cell: [phone] If no response, call [phone]    07/29/2014, 10:37 AM

## 2014-07-29 NOTE — Progress Notes (Signed)
Physical Therapy Treatment Patient Details Name: [name] [name] MRN: [id number] DOB: 26-Jan-1941 Today's Date: 07/29/2014    History of Present Illness 73 yo F with prior h/o hypertension, DM, CVA, and stage 2 CKD, who was brought in by EMS for altered mental status, found to be acidotic upon admission.  Pt with VDRF with difficulty with weaning.      PT Comments    Pt admitted with above. Pt currently with functional limitations due to balance and endurance deficits. Pt has good movement in UE and LEs so anticipate good progress.  Pt will benefit from skilled PT to increase their independence and safety with mobility to allow discharge to the venue listed below.   Follow Up Recommendations  Home health PT;Supervision/Assistance - 24 hour     Equipment Recommendations  Other (comment) (TBA)    Recommendations for Other Services       Precautions / Restrictions Precautions Precautions: Fall Restrictions Weight Bearing Restrictions: No    Mobility  Bed Mobility Overal bed mobility: Needs Assistance;+2 for physical assistance Bed Mobility: Supine to Sit     Supine to sit: Mod assist;+2 for physical assistance;HOB elevated     General bed mobility comments: Pt needed assist for LEs and for elevation of trunk.    Transfers Overall transfer level: Needs assistance Equipment used: None Transfers: Sit to/from Stand Sit to Stand: Mod assist;+2 physical assistance         General transfer comment: Pt was able to perform sit to stand with cues and some assist for anterior lean.  Assisted her with bil HHA.      Ambulation/Gait                 Stairs            Wheelchair Mobility    Modified Rankin (Stroke Patients Only)       Balance Overall balance assessment: Needs assistance Sitting-balance support: Bilateral upper extremity supported;Feet supported Sitting balance-Leahy Scale: Poor Sitting balance - Comments: Pt sat at EOB 10 minutes with varying  assist from min to mod assist.  Pt leaniing to left and posterior at times needing cues for full upright posture of which pt could correct with verbal and tactile cues.   Postural control: Left lateral lean;Posterior lean Standing balance support: Bilateral upper extremity supported;During functional activity Standing balance-Leahy Scale: Poor Standing balance comment: Needed bil UE support for upright stance.  Stood for 1 1/2 minutes with bil UE support and cues.  Took two side steps up to [facility].                      Cognition Arousal/Alertness: Awake/alert Behavior During Therapy: Anxious;Restless Overall Cognitive Status: Impaired/Different from baseline Area of Impairment: Orientation;Following commands;Safety/judgement;Awareness;Problem solving Orientation Level: Disoriented to;Time;Situation     Following Commands: Follows one step commands consistently Safety/Judgement: Decreased awareness of safety;Decreased awareness of deficits   Problem Solving: Requires verbal cues;Requires tactile cues      Exercises General Exercises - Lower Extremity Ankle Circles/Pumps: AROM;Both;10 reps;Supine Long Arc Quad: AROM;Both;10 reps;Seated Hip Flexion/Marching: AROM;Both;10 reps;Seated    General Comments General comments (skin integrity, edema, etc.): wrist and hand restraints      Pertinent Vitals/Pain Pain Assessment: No/denies pain VSS, 40% FiO2 and 5 PEEP.    Home Living                      Prior Function  PT Goals (current goals can now be found in the care plan section) Acute Rehab PT Goals Patient Stated Goal: unable Progress towards PT goals: Progressing toward goals    Frequency  Min 3X/week    PT Plan Current plan remains appropriate    Co-evaluation             End of Session Equipment Utilized During Treatment: Gait belt;Oxygen (ventilator) Activity Tolerance: Patient limited by fatigue Patient left: in bed;with call  bell/phone within reach;with restraints reapplied     Time: 0920-0944 PT Time Calculation (min): 24 min  Charges:  $Therapeutic Exercise: 8-22 mins $Therapeutic Activity: 8-22 mins                    G Codes:      INGOLD,[name] [name] 2014-08-09, 10:31 AM [facility] [phone] (pager)

## 2014-07-29 NOTE — Progress Notes (Signed)
eLink Physician-Brief Progress Note Patient Name: [name] [name] DOB: [birth year] MRN: [id number]   Date of Service  07/29/2014  HPI/Events of Note    eICU Interventions  K replaced     Intervention Category Intermediate Interventions: Electrolyte abnormality - evaluation and management  Cleopatra Sardo S. 07/29/2014, 6:26 AM

## 2014-07-30 ENCOUNTER — Inpatient Hospital Stay (HOSPITAL_COMMUNITY): Payer: Medicare HMO

## 2014-07-30 DIAGNOSIS — Z8673 Personal history of transient ischemic attack (TIA), and cerebral infarction without residual deficits: Secondary | ICD-10-CM

## 2014-07-30 LAB — BASIC METABOLIC PANEL
Anion gap: 11 (ref 5–15)
BUN: 37 mg/dL — AB (ref 6–23)
CALCIUM: 8.5 mg/dL (ref 8.4–10.5)
CO2: 30 mEq/L (ref 19–32)
CREATININE: 1.3 mg/dL — AB (ref 0.50–1.10)
Chloride: 108 mEq/L (ref 96–112)
GFR, EST AFRICAN AMERICAN: 45 mL/min — AB (ref >90)
GFR, EST NON AFRICAN AMERICAN: 39 mL/min — AB (ref >90)
GLUCOSE: 166 mg/dL — AB (ref 70–99)
Potassium: 3.7 mEq/L (ref 3.7–5.3)
Sodium: 149 mEq/L — ABNORMAL HIGH (ref 137–147)

## 2014-07-30 LAB — CULTURE, BLOOD (ROUTINE X 2): Culture: NO GROWTH

## 2014-07-30 LAB — GLUCOSE, CAPILLARY
GLUCOSE-CAPILLARY: 139 mg/dL — AB (ref 70–99)
GLUCOSE-CAPILLARY: 49 mg/dL — AB (ref 70–99)
Glucose-Capillary: 185 mg/dL — ABNORMAL HIGH (ref 70–99)
Glucose-Capillary: 208 mg/dL — ABNORMAL HIGH (ref 70–99)
Glucose-Capillary: 171 mg/dL — ABNORMAL HIGH (ref 70–99)
Glucose-Capillary: 80 mg/dL (ref 70–99)
Glucose-Capillary: 165 mg/dL — ABNORMAL HIGH (ref 70–99)
Glucose-Capillary: 40 mg/dL — CL (ref 70–99)

## 2014-07-30 LAB — MAGNESIUM: Magnesium: 2 mg/dL (ref 1.5–2.5)

## 2014-07-30 MED ORDER — DEXTROSE 50 % IV SOLN
50.0000 mL | Freq: Once | INTRAVENOUS | Status: AC | PRN
  Administered 2014-07-30: 50 mL via INTRAVENOUS

## 2014-07-30 MED ORDER — FREE WATER
200.0000 mL | Freq: Three times a day (TID) | Status: DC
  Administered 2014-07-30: 200 mL

## 2014-07-30 MED ORDER — DEXTROSE 50 % IV SOLN
INTRAVENOUS | Status: AC
  Administered 2014-07-30
  Filled 2014-07-30: qty 50

## 2014-07-30 MED ORDER — HYDRALAZINE HCL 50 MG PO TABS
75.0000 mg | ORAL_TABLET | Freq: Three times a day (TID) | ORAL | Status: DC
  Administered 2014-07-30 – 2014-08-02 (×5): 75 mg via ORAL
  Filled 2014-07-30 (×12): qty 1

## 2014-07-30 MED ORDER — JEVITY 1.2 CAL PO LIQD
1000.0000 mL | ORAL | Status: DC
  Administered 2014-07-30: 1000 mL
  Filled 2014-07-30 (×5): qty 1000

## 2014-07-30 MED ORDER — CETYLPYRIDINIUM CHLORIDE 0.05 % MT LIQD
7.0000 mL | Freq: Two times a day (BID) | OROMUCOSAL | Status: DC
  Administered 2014-07-30 – 2014-08-05 (×12): 7 mL via OROMUCOSAL

## 2014-07-30 MED ORDER — HALOPERIDOL LACTATE 5 MG/ML IJ SOLN
4.0000 mg | Freq: Once | INTRAMUSCULAR | Status: AC
  Administered 2014-07-30: 4 mg via INTRAVENOUS

## 2014-07-30 MED ORDER — DEXTROSE 50 % IV SOLN
INTRAVENOUS | Status: AC
  Filled 2014-07-30: qty 50

## 2014-07-30 NOTE — Progress Notes (Signed)
Pt received. No OG tube in place. MD notified.

## 2014-07-30 NOTE — Progress Notes (Signed)
PULMONARY / CRITICAL CARE MEDICINE   Name: [name] [name] MRN: [id number] DOB: 02-19-1941    ADMISSION DATE:  07/24/2014 CONSULTATION DATE:  07/30/2014  REFERRING MD :  Rhona Leavens  CHIEF COMPLAINT:  Acute respiratory distress  INITIAL PRESENTATION: [name] with COPD, CVA, CK D., Diabetes admitted 9/18 with acute respiratory distress , hypercarbic respiratory failure, DKA with glucose of 802 and anion gap of 19 , requiring mechanical ventilation. Chest x-ray showed left upper lobe infiltrate. 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  STUDIES:  9/18 head CT-no mass shift or bleed  SIGNIFICANT EVENTS: 9/18 intubated 9/19 off insulin gtt 9/21 extubated 9/22 re-intubated for poor mental status  9/23 extubated   SUBJECTIVE:  Extubated yesterday, tolerating OK, self suctioning as needed, still not very interactive  VITAL SIGNS: Temp:  [97.7 F (36.5 C)-98.7 F (37.1 C)] 98 F (36.7 C) (09/24 0835) Pulse Rate:  [68-109] 91 (09/24 1000) Resp:  [9-30] 24 (09/24 1000) BP: (125-225)/(37-198) 170/57 mmHg (09/24 1000) SpO2:  [95 %-100 %] 100 % (09/24 1000) Weight:  [77.6 kg (171 lb 1.2 oz)] 77.6 kg (171 lb 1.2 oz) (09/24 0450) HEMODYNAMICS:   VENTILATOR SETTINGS:   INTAKE / OUTPUT:  Intake/Output Summary (Last 24 hours) at 07/30/14 1124 Last data filed at 07/30/14 0600  Gross per 24 hour  Intake   1025 ml  Output   1995 ml  Net   -970 ml    PHYSICAL EXAMINATION:  Gen: awake, follows simple commands HEENT: NCAT, PERRL,  PULM: few crackles in bases CV: tachy, regular, no mgr, no JVD AB: BS+, soft, nontender Ext: warm, no edema, no clubbing, no cyanosis Derm: no rash or skin breakdown Neuro: follows commands, awake but  non-verbal   LABS:  CBC  Recent Labs Lab 07/26/14 0410 07/27/14 0415 07/29/14 0409  WBC 12.1* 7.8 7.1  HGB 10.4* 9.8* 9.5*  HCT 31.1* 30.8* 28.8*  PLT 192 178 173   Coag's  Recent Labs Lab 07/24/14 1000  INR 1.14   BMET  Recent Labs Lab 07/27/14 0415 07/28/14 0425 07/29/14 0409  NA 151* 146 148*  K 3.8 3.2* 3.0*  CL 110 105 108  CO2 33* 30 29  BUN 49* 46* 47*  CREATININE 1.25* 1.26* 1.30*  GLUCOSE 210* 111* 156*   Electrolytes  Recent Labs Lab 07/27/14 0415 07/28/14 0425 07/29/14 0409 07/30/14 0430  CALCIUM 8.2* 8.5 8.3*  --   MG  --   --  2.0 2.0   Sepsis Markers  Recent Labs Lab 07/24/14 1022 07/24/14 1845  LATICACIDVEN 1.73 4.3*   ABG  Recent Labs Lab 07/24/14 1307 07/25/14 0838 07/28/14 0250  PHART 7.291* 7.427 7.513*  PCO2ART 63.5* 48.3* 38.5  PO2ART 175.0* 86.0 78.8*   Liver Enzymes  Recent Labs Lab 07/24/14 1000 07/24/14 1950 07/25/14 0525  AST ALT ALKPHOS 91 73 62  BILITOT 0.3 0.3 0.3  ALBUMIN 3.3* 2.8* 2.6*   Cardiac Enzymes  Recent Labs Lab 07/24/14 1000 07/24/14 1950  TROPONINI <0.30 <0.30  PROBNP 6064.0*  --    Glucose  Recent Labs Lab 07/29/14 1554 07/29/14 1852 07/30/14 0001 07/30/14 0044 07/30/14 0431 07/30/14 0834  GLUCAP 245* 162* 49* 139* 185* 208*  Imaging Dg Chest Port 1 View  07/29/2014   CLINICAL DATA:  Intubation.  EXAM: PORTABLE CHEST - 1 VIEW  COMPARISON:  07/28/2014.  FINDINGS: Endotracheal tube 1.9 cm above the carina. Left IJ line in NG tube in stable position. Cardiac monitor stable position. Heart size is stable. Pulmonary vascularity is normal. Bibasilar atelectasis and/or infiltrates are present. Small bilateral pleural effusions cannot be excluded. A mild component congestive heart failure cannot be excluded. No pneumothorax. No acute osseus abnormality.  IMPRESSION: 1. Interval reposition of endotracheal tube. Its tip is now 1.9 cm above the carina left IJ  line, and NG tube in stable position. 2. Interval development of bibasilar infiltrates and possible tiny pleural effusions. Findings suggest mild CHF. Bibasilar pneumonia cannot be excluded.   Electronically Signed   By: Maisie Fus  Register   On: 07/29/2014 07:45     ASSESSMENT / PLAN:  PULMONARY OETT 9/18>> 9/21; re-intubated for airway protection A:  Acute hypercarbic respiratory failure > resolved COPD not in exacerbation HCAP Aspiration risk P:   Aspiration precautions Speech eval If re-intubated then trach Minimize sedating medications  CARDIOVASCULAR CVL 9/18 L IJ >> A: Hypotension resolved TEE 03/2014- normal LV function Atrial flutter and prolonged QT on EKG Bradycardia 9/23 AM on precedex P:  Convert hydralazine and b-blocker to per tube Keep net even to slightly negative  RENAL A:  AK I on CKD stage III Hypernatremia P:   Avoid nephrotoxins Repeat BMET now Continue D5 for now Add free water per tube  GASTROINTESTINAL A:  No issues P:   NPO until SLP eval Place Panda, start tube feedings  HEMATOLOGIC A:  No issues P:  SQ heparin for DVT prophylaxis  INFECTIOUS A:  LUL HCAP, allergic to penicillin BCx2 9/18 >>1/2 coag neg staph UC 9/18 >>ng Sputum9/18 >>OPF P:   Abx: Aztreonam, start date= 9/18 plan 7 days  Vancomycin 9/18 >9/21  ENDOCRINE A:  DKA versus hyperosmolar > resolved  P:   Continue lantus at current dosing Continue SSI  NEUROLOGIC A:  Acute encephalopathy, related to hypercarbia and infection and sedating medications, now improved but unclear baseline (son says walked with help and conversed after stroke, but needed 24 hour care in SNF and failed at home).  Is this her baseline? P:   Fentanyl when necessary Minimize sedating meds Frequent orientation Haldol (not ativan) prn  TODAY'S SUMMARY: Tolerating extubation but remains aspiration risk.  Treat hypernatremia with panda/free water.    Transfer to SDU, TRH servce    Heber Index, MD [facility] Pager: [phone] Cell: [phone] If no response, call [phone]    07/30/2014, 11:24 AM

## 2014-07-30 NOTE — Evaluation (Signed)
Clinical/Bedside Swallow Evaluation Patient Details  Name: [name] [name] MRN: [id number] Date of Birth: 1941/02/28  Today's Date: 07/30/2014 Time: 0865-7846 SLP Time Calculation (min): 17 min  Past Medical History:  Past Medical History  Diagnosis Date  . Hypertension   . Diabetes mellitus   . Chronic renal insufficiency   . Hyperlipidemia   . Osteopenia   . CVA (cerebral infarction)   . Thyroid disease    Past Surgical History:  Past Surgical History  Procedure Laterality Date  . Eye surgery    . Loop recorder implant  03-24-2014    MDT LinQ implanted by Dr Johney Frame for cryptogenic stroke  . Tee without cardioversion N/A 03/23/2014    Procedure: TRANSESOPHAGEAL ECHOCARDIOGRAM (TEE);  Surgeon: Lars Masson, MD;  Location: [facility] ENDOSCOPY;  Service: Cardiovascular;  Laterality: N/A;   HPI:  73 year old [name] [name] with COPD, CVA, CK D., Diabetes admitted 9/18 with acute respiratory distress , hypercarbic respiratory failure, DKA with glucose of 802 and anion gap of 19 , requiring mechanical ventilation. Chest x-ray showed left upper lobe infiltrate. Was placed on BiPAP initially, but unresponsive on arrival to the ICU, hence intubated. She had recent admit from 8/6 to 814 for hypercarbic failure that improved with BiPAP-dc'd to NH , then home since 9/14. 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 Patient has been seen by SLP during previous admissions and has been observed with a delayed oral phase and suspected delayed swallow initiation with s/s of aspiration with thin liquids via straw. Most frequent diet recommendations for dysphagia 1 solids, thin liquids.    Assessment / Plan / Recommendation Clinical Impression  Bedside swallow evaluation complete. Patient with altered mentation at this time significantly impacting functionality and safety of swallow. Vocal quality wet indicative of penetration and/or aspiration (likely) of secretions with inability to clear given  extremely weak cough. Limited po trials provided without functional oral acceptance of bolus with consistent anterior labial spillage despite SLP cueing. Recommend continued NPO. Prognosis for ability to advance diet good with improved mentation. Discussed with RN.     Aspiration Risk  Severe    Diet Recommendation NPO;Alternative means - temporary   Medication Administration: Via alternative means       Follow Up Recommendations   (TBD)    Frequency and Duration min 2x/week  2 weeks   Pertinent Vitals/Pain n/a        Swallow Study    General HPI: 73 year old [name] [name] with COPD, CVA, CK D., Diabetes admitted 9/18 with acute respiratory distress , hypercarbic respiratory failure, DKA with glucose of 802 and anion gap of 19 , requiring mechanical ventilation. Chest x-ray showed left upper lobe infiltrate. Was placed on BiPAP initially, but unresponsive on arrival to the ICU, hence intubated. She had recent admit from 8/6 to 814 for hypercarbic failure that improved with BiPAP-dc'd to NH , then home since 9/14. 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 Patient has been seen by SLP during previous admissions and has been observed with a delayed oral phase and suspected delayed swallow initiation with s/s of aspiration with thin liquids via straw. Most frequent diet recommendations for dysphagia 1 solids, thin liquids.  Type of Study: Bedside swallow evaluation Previous Swallow Assessment: see HPI Diet Prior to this Study: NPO Temperature Spikes Noted: No Respiratory Status: Nasal cannula History of Recent Intubation: Yes Length of Intubations (days): 2 days (9/18-9/19, 9/22-9/23) Date extubated: 07/29/14 Behavior/Cognition: Alert;Confused;Requires cueing;Doesn't follow directions;Decreased sustained attention Oral Cavity -  Dentition: Edentulous Self-Feeding Abilities: Total assist Patient Positioning: Upright in bed Baseline Vocal Quality: Low vocal  intensity;Breathy;Wet Volitional Cough: Weak Volitional Swallow: Unable to elicit    Oral/Motor/Sensory Function Overall Oral Motor/Sensory Function: Other (comment) (unable to formally assess due to AMS, no focal deficit noted)   Ice Chips Ice chips: Impaired Presentation: Spoon Oral Phase Impairments: Reduced labial seal;Poor awareness of bolus Oral Phase Functional Implications: Right anterior spillage;Left anterior spillage   Thin Liquid Thin Liquid: Not tested    Nectar Thick Nectar Thick Liquid: Not tested   Honey Thick Honey Thick Liquid: Not tested   Puree Puree: Not tested   Solid   GO   [name] Baumert MA, CCC-SLP [phone]  Solid: Not tested       [name] [name] [name] [name] 07/30/2014,10:15 AM

## 2014-07-30 NOTE — Progress Notes (Signed)
NUTRITION FOLLOW UP  Intervention:    Restart TF via NGT with Jevity 1.2 at 60 ml/h (1440 ml per day) to provide 1728 kcals, 80 gm protein, 1166 ml free water daily.  Nutrition Dx:   Inadequate oral intake related to inability to eat as evidenced by NPO status, ongoing.  Goal:   Intake to meet >90% of estimated nutrition needs. Unmet.   Monitor:   Swallowing function, ability to start PO diet, TF tolerance/adequacy, weight trend, labs, vent status.  Assessment:   [name] with COPD, CVA, CK D, Diabetes admitted 9/18 with acute respiratory distress , hypercarbic respiratory failure, DKA with glucose of 802 and anion gap of 19 , requiring mechanical ventilation.  Patient was extubated on 9/23. TF off since extubation. Unable to safely take PO's at this time. SLP recommends short term alternate means of nutrition. Discussed patient in ICU rounds today. RD to re-order TF. NGT to be placed today for medications and feedings.  Height: Ht Readings from Last 1 Encounters:  07/24/14  (1.6 m)    Weight Status:   Wt Readings from Last 1 Encounters:  07/30/14 171 lb 1.2 oz (77.6 kg)  07/25/14  169 lb 5 oz (76.8 kg)   Re-estimated needs:  Kcal: 1550-1750 Protein: 75-90 gm Fluid: 1.6-1.8 L  Skin: no issues  Diet Order: NPO   Intake/Output Summary (Last 24 hours) at 07/30/14 1139 Last data filed at 07/30/14 0600  Gross per 24 hour  Intake   1025 ml  Output   1995 ml  Net   -970 ml    Last BM: PTA   Labs:   Recent Labs Lab 07/27/14 0415 07/28/14 0425 07/29/14 0409 07/30/14 0430  NA 151* 146 148*  --   K 3.8 3.2* 3.0*  --   CL 110 105 108  --   CO2 33* 30 29  --   BUN 49* 46* 47*  --   CREATININE 1.25* 1.26* 1.30*  --   CALCIUM 8.2* 8.5 8.3*  --   MG  --   --  2.0 2.0  GLUCOSE 210* 111* 156*  --     CBG (last 3)   Recent Labs  07/30/14 0044 07/30/14 0431 07/30/14 0834  GLUCAP 139* 185* 208*    Scheduled Meds: . antiseptic oral rinse  7 mL  Mouth Rinse q12n4p  . atorvastatin  40 mg Oral q1800  . aztreonam  1 g Intravenous Q8H  . carvedilol  12.5 mg Oral BID WC  . chlorhexidine  15 mL Mouth Rinse BID  . free water  200 mL Per Tube 3 times per day  . furosemide  40 mg Intravenous Daily  . heparin  5,000 Units Subcutaneous 3 times per day  . hydrALAZINE  75 mg Oral TID  . insulin aspart  0-15 Units Subcutaneous 6 times per day  . insulin glargine  10 Units Subcutaneous Once  . insulin glargine  20 Units Subcutaneous Daily  . ipratropium-albuterol  3 mL Nebulization Q6H  . levothyroxine  50 mcg Oral QAC breakfast  . nitroGLYCERIN  1 inch Topical 3 times per day  . pantoprazole (PROTONIX) IV  40 mg Intravenous Daily  . predniSONE  20 mg Per Tube Q breakfast  . sodium chloride  750 mL Intravenous Once  . sodium chloride  3 mL Intravenous Q12H    Continuous Infusions: . dextrose 50 mL/hr at 07/30/14 0600  . feeding supplement (VITAL AF 1.2 CAL) 1,000 mL (07/29/14 0800)  Molli Barrows, RD, LDN, [place] Pager [phone] After Hours Pager [phone]

## 2014-07-30 NOTE — Progress Notes (Signed)
Hypoglycemic Event  CBG: 49  Treatment: 1 amp d50  Symptoms: none  Follow-up CBG: Time 0045 CBG Result: 136  Possible Reasons for Event:  npo    Eilleen Kempf  Remember to initiate Hypoglycemia Order Set & complete

## 2014-07-31 DIAGNOSIS — E039 Hypothyroidism, unspecified: Secondary | ICD-10-CM

## 2014-07-31 DIAGNOSIS — R1310 Dysphagia, unspecified: Secondary | ICD-10-CM

## 2014-07-31 DIAGNOSIS — E785 Hyperlipidemia, unspecified: Secondary | ICD-10-CM

## 2014-07-31 DIAGNOSIS — I10 Essential (primary) hypertension: Secondary | ICD-10-CM

## 2014-07-31 DIAGNOSIS — E1129 Type 2 diabetes mellitus with other diabetic kidney complication: Secondary | ICD-10-CM

## 2014-07-31 DIAGNOSIS — J441 Chronic obstructive pulmonary disease with (acute) exacerbation: Secondary | ICD-10-CM

## 2014-07-31 LAB — BASIC METABOLIC PANEL
ANION GAP: 14 (ref 5–15)
BUN: 31 mg/dL — AB (ref 6–23)
CALCIUM: 8.6 mg/dL (ref 8.4–10.5)
CHLORIDE: 106 meq/L (ref 96–112)
CO2: 28 meq/L (ref 19–32)
CREATININE: 1.27 mg/dL — AB (ref 0.50–1.10)
GFR calc Af Amer: 47 mL/min — ABNORMAL LOW (ref >90)
GFR calc non Af Amer: 40 mL/min — ABNORMAL LOW (ref >90)
GLUCOSE: 84 mg/dL (ref 70–99)
Potassium: 3.6 mEq/L — ABNORMAL LOW (ref 3.7–5.3)
Sodium: 148 mEq/L — ABNORMAL HIGH (ref 137–147)

## 2014-07-31 LAB — GLUCOSE, CAPILLARY
GLUCOSE-CAPILLARY: 72 mg/dL (ref 70–99)
GLUCOSE-CAPILLARY: 130 mg/dL — AB (ref 70–99)
GLUCOSE-CAPILLARY: 170 mg/dL — AB (ref 70–99)
Glucose-Capillary: 113 mg/dL — ABNORMAL HIGH (ref 70–99)
Glucose-Capillary: 79 mg/dL (ref 70–99)
Glucose-Capillary: 92 mg/dL (ref 70–99)

## 2014-07-31 LAB — MAGNESIUM: Magnesium: 2 mg/dL (ref 1.5–2.5)

## 2014-07-31 MED ORDER — CLONIDINE HCL 0.2 MG/24HR TD PTWK
0.2000 mg | MEDICATED_PATCH | TRANSDERMAL | Status: DC
  Administered 2014-07-31: 0.2 mg via TRANSDERMAL
  Filled 2014-07-31: qty 1

## 2014-07-31 MED ORDER — CARVEDILOL 25 MG PO TABS
25.0000 mg | ORAL_TABLET | Freq: Two times a day (BID) | ORAL | Status: DC
  Administered 2014-08-01 – 2014-08-05 (×8): 25 mg via ORAL
  Filled 2014-07-31 (×12): qty 1

## 2014-07-31 MED ORDER — HYDRALAZINE HCL 20 MG/ML IJ SOLN
25.0000 mg | INTRAMUSCULAR | Status: DC | PRN
  Administered 2014-08-02 – 2014-08-05 (×6): 25 mg via INTRAVENOUS
  Filled 2014-07-31 (×7): qty 2

## 2014-07-31 MED ORDER — INSULIN ASPART 100 UNIT/ML ~~LOC~~ SOLN
0.0000 [IU] | SUBCUTANEOUS | Status: DC
  Administered 2014-08-01 (×2): 3 [IU] via SUBCUTANEOUS
  Administered 2014-08-01 (×2): 4 [IU] via SUBCUTANEOUS

## 2014-07-31 MED ORDER — CLONIDINE HCL 0.1 MG/24HR TD PTWK
0.1000 mg | MEDICATED_PATCH | TRANSDERMAL | Status: DC
  Filled 2014-07-31: qty 1

## 2014-07-31 MED ORDER — HYDRALAZINE HCL 20 MG/ML IJ SOLN: 20.0000 mg | INTRAMUSCULAR | Status: DC | PRN

## 2014-07-31 MED ORDER — HALOPERIDOL LACTATE 5 MG/ML IJ SOLN
1.0000 mg | Freq: Four times a day (QID) | INTRAMUSCULAR | Status: DC
  Administered 2014-08-01 – 2014-08-02 (×4): 1 mg via INTRAVENOUS
  Filled 2014-07-31: qty 0.2
  Filled 2014-07-31: qty 1
  Filled 2014-07-31 (×3): qty 0.2
  Filled 2014-07-31 (×2): qty 1
  Filled 2014-07-31: qty 0.2
  Filled 2014-07-31: qty 1
  Filled 2014-07-31 (×3): qty 0.2

## 2014-07-31 MED ORDER — HYDRALAZINE HCL 20 MG/ML IJ SOLN
15.0000 mg | INTRAMUSCULAR | Status: DC | PRN
  Administered 2014-07-31: 15 mg via INTRAVENOUS
  Filled 2014-07-31: qty 1

## 2014-07-31 MED ORDER — INSULIN GLARGINE 100 UNIT/ML ~~LOC~~ SOLN
10.0000 [IU] | Freq: Every day | SUBCUTANEOUS | Status: DC
  Administered 2014-08-01 – 2014-08-02 (×2): 10 [IU] via SUBCUTANEOUS
  Filled 2014-07-31 (×2): qty 0.1

## 2014-07-31 NOTE — Progress Notes (Signed)
[place] TEAM 1 - Stepdown/ICU TEAM Progress Note  [name] [name] BJY:[id number] DOB: January 26, 1941 DOA: 07/24/2014 PCP: Alva Garnet., MD  Admit HPI / Brief Narrative: 73 year old PMHx HTN, copd, cva, DM CKD who presents from home after being found to be in acute respiratory distress. Please note, pt is on BiPAP and cannot provide history. Husband is at bedside but is a very poor historian. Per report, pt was in her usual state of health until the AM of admission when pt was noted to have increased respiratory effort at home. Pt normally uses "puffers" however she had run out and no longer has any at home. Pt was brought to the ED where a cxr was notable for a LUL pna. Pt was started on empiric abx. Also, pt was noted to have glucose over 800's. Insulin gtt was started. An abg was notable for pH of 7.25 with PCO2 of 72. Pt was cont on BiPAP with repeat pH of 7.29 and PCO2 of 63.   HPI/Subjective: 9/25 A/O. x1 (does not know when, where, why), follows commands  Assessment/Plan: Acute hypercarbic respiratory failure -Resolved  LUL HCAP, allergic to penicillin  -Completed 7 day course of antibiotics   Aspiration risk  -Continue n.p.o.  -IR to replace NG tube  -Family to decide on PEG tube placement   Hypertension uncontrolled -Inc Coreg to 25 mg BID -Inc Hydralazine to 25 mg q 4hr PRN SBP >180 -Start Catapres patch 0.2 mg  ACUTE ON CHRONIC KIDNEY FAILURE stage III (baseline Cr 1.06-1.20) -Improving continue hydrate -Avoid nephrotoxins   Hypernatremia  -Improving continue D5W   DKA versus hyperosmolar -Resolved  -Patient currently n.p.o. secondary to pulling NG tube  -IR to place NG tube -Family conference; PEG tube placement? Continue lantus at current dosing  Continue SSI   Acute encephalopathy -Multifactorial, hypercarbia, infection and sedating medications, per notes improved. Per notes (son says walked with help and conversed after stroke, but needed 24 hour care in  SNF and failed at home).  -Minimize sedating meds (no benzodiazepines)  -Frequent orientation  -Haldol 1 mg  QID   Hypothyroidism -Continue Synthroid 50 mcg -Obtain TSH/free T4    Code Status: FULL Family Communication: no family present at time of exam Disposition Plan: SNF vs CIR vs home   Consultants: Dr. Max Fickle [facility] M.)  Procedure/Significant Events: 9/18 head CT-no mass shift or bleed-old right basal ganglia/ bilateral thalamic lacunar infarct 9/18 intubated  9/19 off insulin gtt  9/21 extubated  9/22 re-intubated for poor mental status  9/23 extubated   Culture 9/18 blood x2 positive1/2 coag neg staph  9/18 urine  negative 9/18 Sputum negative 9/18 MRSA by PCR negative   Antibiotics: Aztreonam, 9/18 >> stopped 9/24   Vancomycin 9/18 > stopped 9/21   DVT prophylaxis: Heparin subcutaneous   Devices    LINES / TUBES:      Continuous Infusions: . dextrose 75 mL (07/31/14 0800)  . feeding supplement (JEVITY 1.2 CAL) Stopped (07/30/14 2000)    Objective: VITAL SIGNS: Temp: 98.4 F (36.9 C) (09/25 1100) Temp src: Oral (09/25 1100) BP: 181/48 mmHg (09/25 1100) Pulse Rate: 92 (09/25 1100) SPO2; FIO2:   Intake/Output Summary (Last 24 hours) at 07/31/14 1211 Last data filed at 07/31/14 1100  Gross per 24 hour  Intake 1296.84 ml  Output    525 ml  Net 771.84 ml     Exam: General: A./O. x1 (does not know where, when, why), No acute respiratory distress Lungs: Clear to auscultation bilaterally  without wheezes or crackles Cardiovascular: Regular rate and rhythm without murmur gallop or rub normal S1 and S2 Abdomen: Nontender, nondistended, soft, bowel sounds positive, no rebound, no ascites, no appreciable mass Extremities: No significant cyanosis, clubbing, or edema bilateral lower extremities Neurologic; cranial nerve II through XII intact, left facial droop, slurring of speech (baseline?), Moves all extremities to command,  sensation appears intact throughout, did not ambulate patient.  Data Reviewed: Basic Metabolic Panel:  Recent Labs Lab 07/27/14 0415 07/28/14 0425 07/29/14 0409 07/30/14 0430 07/30/14 1200 07/31/14 0400  NA 151* 146 148*  --  149* 148*  K 3.8 3.2* 3.0*  --  3.7 3.6*  CL 110 105 108  --  108 106  CO2 33* 30 29  --  30 28  GLUCOSE 210* 111* 156*  --  166* 84  BUN 49* 46* 47*  --  37* 31*  CREATININE 1.25* 1.26* 1.30*  --  1.30* 1.27*  CALCIUM 8.2* 8.5 8.3*  --  8.5 8.6  MG  --   --  2.0 2.0  --  2.0   Liver Function Tests:  Recent Labs Lab 07/24/14 1950 07/25/14 0525  AST 14 12  ALT 14 13  ALKPHOS 73 62  BILITOT 0.3 0.3  PROT 6.0 5.7*  ALBUMIN 2.8* 2.6*   No results found for this basename: LIPASE, AMYLASE,  in the last 168 hours No results found for this basename: AMMONIA,  in the last 168 hours CBC:  Recent Labs Lab 07/25/14 0525 07/26/14 0410 07/27/14 0415 07/29/14 0409  WBC 12.2* 12.1* 7.8 7.1  HGB 9.3* 10.4* 9.8* 9.5*  HCT 28.3* 31.1* 30.8* 28.8*  MCV 89.8 89.1 90.9 89.2  PLT 216 192 178 173   Cardiac Enzymes:  Recent Labs Lab 07/24/14 1950  TROPONINI <0.30   BNP (last 3 results)  Recent Labs  06/09/14 1344 06/12/14 1330 07/24/14 1000  PROBNP 1271.0* 1666.0* 6064.0*   CBG:  Recent Labs Lab 07/30/14 2207 07/31/14 0013 07/31/14 0412 07/31/14 0730 07/31/14 1130  GLUCAP 165* 79 72 113* 130*    Recent Results (from the past 240 hour(s))  CULTURE, BLOOD (ROUTINE X 2)     Status: None   Collection Time    07/24/14 10:00 AM      Result Value Ref Range Status   Specimen Description BLOOD LEFT ANTECUBITAL   Final   Special Requests BOTTLES DRAWN AEROBIC AND ANAEROBIC   Final   Culture  Setup Time     Final   Value: 07/24/2014 14:55     Performed at [facility]   Culture     Final   Value: STAPHYLOCOCCUS SPECIES (COAGULASE NEGATIVE)     Note: THE SIGNIFICANCE OF ISOLATING THIS ORGANISM FROM A SINGLE SET OF BLOOD  CULTURES WHEN MULTIPLE SETS ARE DRAWN IS UNCERTAIN. PLEASE NOTIFY THE MICROBIOLOGY DEPARTMENT WITHIN ONE WEEK IF SPECIATION AND SENSITIVITIES ARE REQUIRED.     Note: Gram Stain Report Called to,Read Back By and Verified With: CELENIA SOSA @ 1043 ON [id number] BY [facility]     Performed at [facility]   Report Status 07/26/2014 FINAL   Final  CULTURE, BLOOD (ROUTINE X 2)     Status: None   Collection Time    07/24/14 10:10 AM      Result Value Ref Range Status   Specimen Description BLOOD RIGHT ANTECUBITAL   Final   Special Requests BOTTLES DRAWN AEROBIC AND ANAEROBIC   Final   Culture  Setup Time  Final   Value: 07/24/2014 14:55     Performed at [facility]   Culture     Final   Value: NO GROWTH 5 DAYS     Performed at [facility]   Report Status 07/30/2014 FINAL   Final  URINE CULTURE     Status: None   Collection Time    07/24/14 12:50 PM      Result Value Ref Range Status   Specimen Description URINE, CATHETERIZED   Final   Special Requests NONE   Final   Culture  Setup Time     Final   Value: 07/24/2014 13:54     Performed at [facility] Count     Final   Value: NO GROWTH     Performed at [facility]   Culture     Final   Value: NO GROWTH     Performed at [facility]   Report Status 07/25/2014 FINAL   Final  MRSA PCR SCREENING     Status: None   Collection Time    07/24/14  6:06 PM      Result Value Ref Range Status   MRSA by PCR NEGATIVE  NEGATIVE Final   Comment:            The GeneXpert MRSA Assay (FDA     approved for NASAL specimens     only), is one component of a     comprehensive MRSA colonization     surveillance program. It is not     intended to diagnose MRSA     infection nor to guide or     monitor treatment for     MRSA infections.  CULTURE, RESPIRATORY (NON-EXPECTORATED)     Status: None   Collection Time    07/24/14  8:47 PM      Result Value Ref Range Status   Specimen Description  TRACHEAL ASPIRATE   Final   Special Requests NONE   Final   Gram Stain     Final   Value: NO WBC SEEN     RARE SQUAMOUS EPITHELIAL CELLS PRESENT     NO ORGANISMS SEEN     Performed at [facility]   Culture     Final   Value: Non-Pathogenic Oropharyngeal-type Flora Isolated.     Performed at [facility]   Report Status 07/27/2014 FINAL   Final     Studies:  Recent x-ray studies have been reviewed in detail by the Attending Physician  Scheduled Meds:  Scheduled Meds: . antiseptic oral rinse  7 mL Mouth Rinse BID  . atorvastatin  40 mg Oral q1800  . carvedilol  12.5 mg Oral BID WC  . free water  200 mL Per Tube 3 times per day  . furosemide  40 mg Intravenous Daily  . heparin  5,000 Units Subcutaneous 3 times per day  . hydrALAZINE  75 mg Oral TID  . insulin aspart  0-15 Units Subcutaneous 6 times per day  . insulin glargine  10 Units Subcutaneous Once  . insulin glargine  20 Units Subcutaneous Daily  . ipratropium-albuterol  3 mL Nebulization Q6H  . levothyroxine  50 mcg Oral QAC breakfast  . nitroGLYCERIN  1 inch Topical 3 times per day  . predniSONE  20 mg Per Tube Q breakfast  . sodium chloride  750 mL Intravenous Once  . sodium chloride  3 mL Intravenous Q12H    Time spent  on care of this patient: 40 mins   Drema Dallas , MD   [facility] Office  [phone] Pager [phone]  On-Call/Text Page:      [email or web address]      password [email or web address]  If 7PM-7AM, please contact night-coverage [email or web address] Password [email or web address] 07/31/2014, 12:11 PM   LOS: 7 days

## 2014-07-31 NOTE — Progress Notes (Signed)
Dr Joseph Art contacted regarding pt's blood sugars elevating while D5 infusing per previous orders; instructed by Dr. Joseph Art to continue D5 infusion and treating with insulin per new orders; Dr Joseph Art also made aware of significant variations in BP values obtained from bilateral arms vs bilateral lower legs; instructed to treat and manage pt based on BP measurements obtained from arms

## 2014-07-31 NOTE — Progress Notes (Signed)
Found pt with central line dressing removed and possible dislodgment of central line. Central line removed and PIV access obtained. MD notified.

## 2014-07-31 NOTE — Progress Notes (Addendum)
Speech Language Pathology Treatment: Dysphagia  Patient Details Name: [name] [name] MRN: [id number] DOB: [birth year] Today's Date: 07/31/2014 Time: 4098-1191 SLP Time Calculation (min): 21 min  Assessment / Plan / Recommendation Clinical Impression  Pt. awake and continues to demonstrate poor awareness, confusion, required max verbal/tactile/visual cueing during po consumption.  Oral manipulation and transit was prolonged; suspect delayed swallow initiation leading to high aspiration risk at this time with any solids/liquids, in addition to decreased alertness/awareness.  Recommend continued NPO, consider short term alternate nutrition with continued ST intervention toward goal of oral consumption.   HPI HPI: [name] with COPD, CVA, CK D., Diabetes admitted 9/18 with acute respiratory distress , hypercarbic respiratory failure, DKA with glucose of 802 and anion gap of 19 , requiring mechanical ventilation. Chest x-ray showed left upper lobe infiltrate. Was placed on BiPAP initially, but unresponsive on arrival to the ICU, hence intubated. She had recent admit from 8/6 to 814 for hypercarbic failure that improved with BiPAP-dc'd to NH , then home since 9/14. 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 Patient has been seen by SLP during previous admissions and has been observed with a delayed oral phase and suspected delayed swallow initiation with s/s of aspiration with thin liquids via straw. Most frequent diet recommendations for dysphagia 1 solids, thin liquids.    Pertinent Vitals Pain Assessment: No/denies pain  SLP Plan  Continue with current plan of care    Recommendations Diet recommendations: NPO Medication Administration: Via alternative means              Oral Care Recommendations: Oral care BID Follow up Recommendations:  (TBD) Plan: Continue with current plan of care    GO     [name] [name] 07/31/2014, 11:17 AM  [name] [name] [name] [name].Ed  [employer] [phone]

## 2014-07-31 NOTE — Progress Notes (Signed)
MD notified of earlier episode of hypoglycemia. Last CBG 79. No new orders received. Will continue to monitor.

## 2014-07-31 NOTE — Progress Notes (Signed)
PT Cancellation Note  Patient Details Name: LAYNEY GILLSON MRN: [id number] DOB: 10-21-1941   Cancelled Treatment:    Reason Eval/Treat Not Completed: Medical issues which prohibited therapy. BP currently 227/154 (and has been >220 since 7:00 am per flowsheet). RN aware and is working to get BP lowered. Will see later today if medically appropriate and schedule allows.   Freida Nebel 07/31/2014, 9:02 AM Pager [phone]

## 2014-08-01 ENCOUNTER — Inpatient Hospital Stay (HOSPITAL_COMMUNITY): Payer: Medicare HMO

## 2014-08-01 LAB — GLUCOSE, CAPILLARY
GLUCOSE-CAPILLARY: 136 mg/dL — AB (ref 70–99)
GLUCOSE-CAPILLARY: 157 mg/dL — AB (ref 70–99)
GLUCOSE-CAPILLARY: 152 mg/dL — AB (ref 70–99)
Glucose-Capillary: 168 mg/dL — ABNORMAL HIGH (ref 70–99)
Glucose-Capillary: 130 mg/dL — ABNORMAL HIGH (ref 70–99)
Glucose-Capillary: 110 mg/dL — ABNORMAL HIGH (ref 70–99)

## 2014-08-01 LAB — TSH: TSH: 2.65 u[IU]/mL (ref 0.350–4.500)

## 2014-08-01 LAB — T4, FREE: FREE T4: 1.42 ng/dL (ref 0.80–1.80)

## 2014-08-01 MED ORDER — RESOURCE THICKENUP CLEAR PO POWD
ORAL | Status: DC | PRN
  Filled 2014-08-01: qty 125

## 2014-08-01 MED ORDER — INSULIN ASPART 100 UNIT/ML ~~LOC~~ SOLN
0.0000 [IU] | Freq: Three times a day (TID) | SUBCUTANEOUS | Status: DC
  Administered 2014-08-02: 11 [IU] via SUBCUTANEOUS
  Administered 2014-08-02: 4 [IU] via SUBCUTANEOUS
  Administered 2014-08-03: 3 [IU] via SUBCUTANEOUS
  Administered 2014-08-04: 4 [IU] via SUBCUTANEOUS
  Administered 2014-08-04: 3 [IU] via SUBCUTANEOUS

## 2014-08-01 NOTE — Progress Notes (Signed)
[facility] TEAM 1 - Stepdown/ICU TEAM Progress Note  MADYLIN FAIRBANK ZOX:[id number] DOB: [birth year] DOA: 07/24/2014 PCP: Alva Garnet., MD  Admit HPI / Brief Narrative: 73 year old BF PMHx HTN,COPD, Hx CVA, DM Type 2, CKD who presents from home after being found to be in acute respiratory distress. Please note, pt is on BiPAP and cannot provide history. Husband is at bedside but is a very poor historian. Per report, pt was in her usual state of health until the AM of admission when pt was noted to have increased respiratory effort at home. Pt normally uses "puffers" however she had run out and no longer has any at home. Pt was brought to the ED where a cxr was notable for a LUL pna. Pt was started on empiric abx. Also, pt was noted to have glucose over 800's. Insulin gtt was started. An abg was notable for pH of 7.25 with PCO2 of 72. Pt was cont on BiPAP with repeat pH of 7.29 and PCO2 of 63.   HPI/Subjective: 9/26 A/O. x2 (does not know when, why), follows commands and answers appropriately  Assessment/Plan: Acute hypercarbic respiratory failure -Resolved  LUL HCAP, allergic to penicillin  -Completed 7 day course of antibiotics   Aspiration risk  -Patient passed modified barium swallow on 9/26  -Start dysphagia 1 diet with nectar thick liquid   Hypertension uncontrolled -Inc Coreg to 25 mg BID -Inc Hydralazine to 25 mg q 4hr PRN SBP >180 -Continue hydralazine 75 mg TID -Start Catapres patch 0.2 mg -Continue Lasix 40 mg daily  ACUTE ON CHRONIC KIDNEY FAILURE stage III (baseline Cr 1.06-1.20) -creatinine almost at baseline; patient not eating and drinking (dysphagia 1 diet) -Avoid nephrotoxins   Hypernatremia  -Improving continue D5W   Diabetes type 2; DKA versus HHNS -Resolved  -Patient currently n.p.o. secondary to pulling NG tube  -Continue lantus 10 units daily   Continue resistant SSI   Acute encephalopathy -Multifactorial, hypercarbia, infection and sedating  medications, per notes improved. Per notes (son says walked with help and conversed after stroke, but needed 24 hour care in SNF and failed at home).  -Minimize sedating meds (no benzodiazepines)  -Frequent orientation  -Continue Haldol 1 mg  QID  -Consult PT/OT for CIR vs SNF   Hypothyroidism -Continue Synthroid 50 mcg -TSH/FREE T4  WNL,     Code Status: FULL Family Communication: no family present at time of exam Disposition Plan: SNF vs CIR vs home   Consultants: Dr. Max Fickle [facility] M.)  Procedure/Significant Events: 9/18 head CT-no mass shift or bleed-old right basal ganglia/ bilateral thalamic lacunar infarct  9/18 intubated  9/19 off insulin gtt  9/21 extubated  9/22 re-intubated for poor mental status  9/23 extubated   Culture 9/18 blood x2 positive1/2 coag neg staph  9/18 urine  negative 9/18 Sputum negative 9/18 MRSA by PCR negative   Antibiotics: Aztreonam, 9/18 >> stopped 9/24   Vancomycin 9/18 > stopped 9/21   DVT prophylaxis: Heparin subcutaneous   Devices    LINES / TUBES:      Continuous Infusions: . dextrose 1,000 mL (07/31/14 2029)  . feeding supplement (JEVITY 1.2 CAL) Stopped (07/30/14 2000)    Objective: VITAL SIGNS: Temp: 98.5 F (36.9 C) (09/26 1245) Temp src: Oral (09/26 1245) BP: 150/133 mmHg (09/26 0800) SPO2; 100% on room air FIO2:   Intake/Output Summary (Last 24 hours) at 08/01/14 1420 Last data filed at 08/01/14 0800  Gross per 24 hour  Intake   1575 ml  Output  0 ml  Net   1575 ml     Exam: General: A./O. x2 (does not know when, why), No acute respiratory distress Lungs: Clear to auscultation bilaterally without wheezes or crackles Cardiovascular: Regular rate and rhythm without murmur gallop or rub normal S1 and S2 Abdomen: Nontender, nondistended, soft, bowel sounds positive, no rebound, no ascites, no appreciable mass Extremities: No significant cyanosis, clubbing, or edema bilateral lower  extremities Neurologic; cranial nerve II through XII intact, facial droop improved, slurred speech improved , Moves all extremities to command, sensation appears intact throughout, did not ambulate patient.  Data Reviewed: Basic Metabolic Panel:  Recent Labs Lab 07/27/14 0415 07/28/14 0425 07/29/14 0409 07/30/14 0430 07/30/14 1200 07/31/14 0400  NA 151* 146 148*  --  149* 148*  K 3.8 3.2* 3.0*  --  3.7 3.6*  CL 110 105 108  --  108 106  CO2 33* 30 29  --  30 28  GLUCOSE 210* 111* 156*  --  166* 84  BUN 49* 46* 47*  --  37* 31*  CREATININE 1.25* 1.26* 1.30*  --  1.30* 1.27*  CALCIUM 8.2* 8.5 8.3*  --  8.5 8.6  MG  --   --  2.0 2.0  --  2.0   Liver Function Tests: No results found for this basename: AST, ALT, ALKPHOS, BILITOT, PROT, ALBUMIN,  in the last 168 hours No results found for this basename: LIPASE, AMYLASE,  in the last 168 hours No results found for this basename: AMMONIA,  in the last 168 hours CBC:  Recent Labs Lab 07/26/14 0410 07/27/14 0415 07/29/14 0409  WBC 12.1* 7.8 7.1  HGB 10.4* 9.8* 9.5*  HCT 31.1* 30.8* 28.8*  MCV 89.1 90.9 89.2  PLT 192 178 173   Cardiac Enzymes: No results found for this basename: CKTOTAL, CKMB, CKMBINDEX, TROPONINI,  in the last 168 hours BNP (last 3 results)  Recent Labs  06/09/14 1344 06/12/14 1330 07/24/14 1000  PROBNP 1271.0* 1666.0* 6064.0*   CBG:  Recent Labs Lab 07/31/14 2027 08/01/14 0122 08/01/14 0435 08/01/14 0812 08/01/14 1241  GLUCAP 92 168* 136* 130* 110*    Recent Results (from the past 240 hour(s))  CULTURE, BLOOD (ROUTINE X 2)     Status: None   Collection Time    07/24/14 10:00 AM      Result Value Ref Range Status   Specimen Description BLOOD LEFT ANTECUBITAL   Final   Special Requests BOTTLES DRAWN AEROBIC AND ANAEROBIC   Final   Culture  Setup Time     Final   Value: 07/24/2014 14:55     Performed at [facility]   Culture     Final   Value: STAPHYLOCOCCUS SPECIES  (COAGULASE NEGATIVE)     Note: THE SIGNIFICANCE OF ISOLATING THIS ORGANISM FROM A SINGLE SET OF BLOOD CULTURES WHEN MULTIPLE SETS ARE DRAWN IS UNCERTAIN. PLEASE NOTIFY THE MICROBIOLOGY DEPARTMENT WITHIN ONE WEEK IF SPECIATION AND SENSITIVITIES ARE REQUIRED.     Note: Gram Stain Report Called to,Read Back By and Verified With: CELENIA SOSA @ 1043 ON [id number] BY [facility]     Performed at [facility]   Report Status 07/26/2014 FINAL   Final  CULTURE, BLOOD (ROUTINE X 2)     Status: None   Collection Time    07/24/14 10:10 AM      Result Value Ref Range Status   Specimen Description BLOOD RIGHT ANTECUBITAL   Final   Special Requests BOTTLES DRAWN AEROBIC  AND ANAEROBIC   Final   Culture  Setup Time     Final   Value: 07/24/2014 14:55     Performed at [facility]   Culture     Final   Value: NO GROWTH 5 DAYS     Performed at [facility]   Report Status 07/30/2014 FINAL   Final  URINE CULTURE     Status: None   Collection Time    07/24/14 12:50 PM      Result Value Ref Range Status   Specimen Description URINE, CATHETERIZED   Final   Special Requests NONE   Final   Culture  Setup Time     Final   Value: 07/24/2014 13:54     Performed at [facility] Count     Final   Value: NO GROWTH     Performed at [facility]   Culture     Final   Value: NO GROWTH     Performed at [facility]   Report Status 07/25/2014 FINAL   Final  MRSA PCR SCREENING     Status: None   Collection Time    07/24/14  6:06 PM      Result Value Ref Range Status   MRSA by PCR NEGATIVE  NEGATIVE Final   Comment:            The GeneXpert MRSA Assay (FDA     approved for NASAL specimens     only), is one component of a     comprehensive MRSA colonization     surveillance program. It is not     intended to diagnose MRSA     infection nor to guide or     monitor treatment for     MRSA infections.  CULTURE, RESPIRATORY (NON-EXPECTORATED)     Status: None    Collection Time    07/24/14  8:47 PM      Result Value Ref Range Status   Specimen Description TRACHEAL ASPIRATE   Final   Special Requests NONE   Final   Gram Stain     Final   Value: NO WBC SEEN     RARE SQUAMOUS EPITHELIAL CELLS PRESENT     NO ORGANISMS SEEN     Performed at [facility]   Culture     Final   Value: Non-Pathogenic Oropharyngeal-type Flora Isolated.     Performed at [facility]   Report Status 07/27/2014 FINAL   Final     Studies:  Recent x-ray studies have been reviewed in detail by the Attending Physician  Scheduled Meds:  Scheduled Meds: . antiseptic oral rinse  7 mL Mouth Rinse BID  . atorvastatin  40 mg Oral q1800  . carvedilol  25 mg Oral BID WC  . cloNIDine  0.2 mg Transdermal Weekly  . free water  200 mL Per Tube 3 times per day  . furosemide  40 mg Intravenous Daily  . haloperidol lactate  1 mg Intravenous Q6H  . heparin  5,000 Units Subcutaneous 3 times per day  . hydrALAZINE  75 mg Oral TID  . insulin aspart  0-20 Units Subcutaneous 6 times per day  . insulin glargine  10 Units Subcutaneous Daily  . ipratropium-albuterol  3 mL Nebulization Q6H  . levothyroxine  50 mcg Oral QAC breakfast  . nitroGLYCERIN  1 inch Topical 3 times per day  . predniSONE  20 mg Per Tube Q  breakfast  . sodium chloride  3 mL Intravenous Q12H    Time spent on care of this patient: 40 mins   Drema Dallas , MD   [facility] Office  [phone] Pager [phone]  On-Call/Text Page:      [email or web address]      password [email or web address]  If 7PM-7AM, please contact night-coverage [email or web address] Password [email or web address] 08/01/2014, 2:20 PM   LOS: 8 days

## 2014-08-01 NOTE — Procedures (Signed)
Objective Swallowing Evaluation: Modified Barium Swallowing Study  Patient Details  Name: [name] [name] MRN: [id number] Date of Birth: Dec 16, 1940  Today's Date: 08/01/2014 Time: 1300-1330 SLP Time Calculation (min): 30 min  Past Medical History:  Past Medical History  Diagnosis Date  . Hypertension   . Diabetes mellitus   . Chronic renal insufficiency   . Hyperlipidemia   . Osteopenia   . CVA (cerebral infarction)   . Thyroid disease    Past Surgical History:  Past Surgical History  Procedure Laterality Date  . Eye surgery    . Loop recorder implant  03-24-2014    MDT LinQ implanted by Dr Johney Frame for cryptogenic stroke  . Tee without cardioversion N/A 03/23/2014    Procedure: TRANSESOPHAGEAL ECHOCARDIOGRAM (TEE);  Surgeon: Lars Masson, MD;  Location: [facility] ENDOSCOPY;  Service: Cardiovascular;  Laterality: N/A;   HPI:  [name] with COPD, CVA, CK D., Diabetes admitted 9/18 with acute respiratory distress , hypercarbic respiratory failure, DKA with glucose of 802 and anion gap of 19 , requiring mechanical ventilation. Chest x-ray showed left upper lobe infiltrate. Was placed on BiPAP initially, but unresponsive on arrival to the ICU, hence intubated. She had recent admit from 8/6 to 814 for hypercarbic failure that improved with BiPAP-dc'd to NH , then home since 9/14. 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 Patient has been seen by SLP during previous admissions and has been observed with a delayed oral phase and suspected delayed swallow initiation with s/s of aspiration with thin liquids via straw. Most frequent diet recommendations for dysphagia 1 solids, thin liquids.      Assessment / Plan / Recommendation Clinical Impression  Dysphagia Diagnosis: Moderate oral phase dysphagia;Mild pharyngeal phase dysphagia Clinical impression: Pt presents with a primary cognitive based, oral dyspahgia with lingual pumping and piecemeal transit of PO, leading to  multiple swallows per bolus. Swallow response is only slightly delayed but mild base of tongue weakness results in trace vallecular residuals and very trace penetration after the swallow with nectar thick liquids. Pt senses penetrate and expels with a soft throat clear. Given overt aspiration of thin at bedside and pts AMS, thin liquids not tested though expect pt will be able to upgrade as mentation improves. Pt to initiate dys 1 (puree) diet and nectar thick liquids. SLP will follow for tolerance.      Treatment Recommendation  Therapy as outlined in treatment plan below    Diet Recommendation Dysphagia 1 (Puree);Nectar-thick liquid   Liquid Administration via: Cup;Straw Medication Administration: Whole meds with puree Supervision: Staff to assist with self feeding Compensations: Slow rate;Small sips/bites Postural Changes and/or Swallow Maneuvers: Seated upright 90 degrees;Upright 30-60 min after meal    Other  Recommendations Oral Care Recommendations: Oral care BID Other Recommendations: Order thickener from pharmacy   Follow Up Recommendations  Skilled Nursing facility    Frequency and Duration min 2x/week  2 weeks   Pertinent Vitals/Pain NA    SLP Swallow Goals     General HPI: [name] with COPD, CVA, CK D., Diabetes admitted 9/18 with acute respiratory distress , hypercarbic respiratory failure, DKA with glucose of 802 and anion gap of 19 , requiring mechanical ventilation. Chest x-ray showed left upper lobe infiltrate. Was placed on BiPAP initially, but unresponsive on arrival to the ICU, hence intubated. She had recent admit from 8/6 to 814 for hypercarbic failure that improved with BiPAP-dc'd to NH , then home since 9/14. H/o of CVA in June 2015  with residual left-sided hemiparesis. Patient has been seen by SLP during previous admissions and has been observed with a delayed oral phase and suspected delayed swallow initiation with s/s of aspiration with thin liquids via  straw. Most frequent diet recommendations for dysphagia 1 solids, thin liquids.  Type of Study: Modified Barium Swallowing Study Reason for Referral: Objectively evaluate swallowing function Previous Swallow Assessment: see HPI Diet Prior to this Study: NPO Temperature Spikes Noted: No Respiratory Status: Room air History of Recent Intubation: Yes Length of Intubations (days): 2 days Date extubated: 07/29/14 Behavior/Cognition: Alert;Cooperative;Requires cueing Oral Cavity - Dentition: Edentulous Oral Motor / Sensory Function: Within functional limits Self-Feeding Abilities: Total assist Patient Positioning: Upright in chair Baseline Vocal Quality: Clear Volitional Cough: Weak Volitional Swallow: Able to elicit Pharyngeal Secretions: Not observed secondary MBS    Reason for Referral Objectively evaluate swallowing function   Oral Phase Oral Preparation/Oral Phase Oral Phase: Impaired Oral - Nectar Oral - Nectar Cup: Lingual pumping;Reduced posterior propulsion;Piecemeal swallowing;Delayed oral transit Oral - Nectar Straw: Lingual pumping;Reduced posterior propulsion;Piecemeal swallowing;Delayed oral transit Oral - Solids Oral - Puree: Lingual pumping;Reduced posterior propulsion;Piecemeal swallowing;Delayed oral transit   Pharyngeal Phase Pharyngeal Phase Pharyngeal Phase: Impaired Pharyngeal - Nectar Pharyngeal - Nectar Cup: Reduced tongue base retraction;Penetration/Aspiration after swallow;Pharyngeal residue - valleculae Penetration/Aspiration details (nectar cup): Material enters airway, CONTACTS cords then ejected out;Material does not enter airway Pharyngeal - Nectar Straw: Reduced tongue base retraction;Penetration/Aspiration after swallow;Pharyngeal residue - valleculae Penetration/Aspiration details (nectar straw): Material does not enter airway;Material enters airway, CONTACTS cords then ejected out Pharyngeal - Solids Pharyngeal - Puree: Reduced tongue base  retraction;Pharyngeal residue - valleculae Penetration/Aspiration details (puree): Material does not enter airway  Cervical Esophageal Phase    GO    Cervical Esophageal Phase Cervical Esophageal Phase: Impaired Cervical Esophageal Phase - Comment Cervical Esophageal Comment: Appearance of slow transit, stasis through GE junction        [employer], [place] CCC-SLP [phone]  Claudine Mouton 08/01/2014, 1:42 PM

## 2014-08-01 NOTE — Progress Notes (Signed)
Speech Language Pathology Treatment: Dysphagia  Patient Details Name: [name] [name] MRN: [id number] DOB: 07-25-1941 Today's Date: 08/01/2014 Time: 2130-8657 SLP Time Calculation (min): 15 min  Assessment / Plan / Recommendation Clinical Impression  Pt demonstrates improved arousal, awareness of POs and automaticity. She is observed to have probable aspiration with thin liquids, but is able to orally manipulate purees without cough. She does have multiple swallow concerning for safety with this texture. Recommend objective test prior to NG tube placement to determine if POs are possible. MD in agreement.    HPI HPI: [name] with COPD, CVA, CK D., Diabetes admitted 9/18 with acute respiratory distress , hypercarbic respiratory failure, DKA with glucose of 802 and anion gap of 19 , requiring mechanical ventilation. Chest x-ray showed left upper lobe infiltrate. Was placed on BiPAP initially, but unresponsive on arrival to the ICU, hence intubated. She had recent admit from 8/6 to 814 for hypercarbic failure that improved with BiPAP-dc'd to NH , then home since 9/14. 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 Patient has been seen by SLP during previous admissions and has been observed with a delayed oral phase and suspected delayed swallow initiation with s/s of aspiration with thin liquids via straw. Most frequent diet recommendations for dysphagia 1 solids, thin liquids.    Pertinent Vitals Pain Assessment: No/denies pain  SLP Plan  MBS    Recommendations Diet recommendations: NPO Medication Administration: Via alternative means              Oral Care Recommendations: Oral care BID Follow up Recommendations: Skilled Nursing facility Plan: MBS    GO    Harlon Ditty, [place] CCC-SLP [phone]  Claudine Mouton 08/01/2014, 1:28 PM

## 2014-08-02 DIAGNOSIS — E870 Hyperosmolality and hypernatremia: Secondary | ICD-10-CM

## 2014-08-02 DIAGNOSIS — E1165 Type 2 diabetes mellitus with hyperglycemia: Secondary | ICD-10-CM

## 2014-08-02 DIAGNOSIS — IMO0001 Reserved for inherently not codable concepts without codable children: Secondary | ICD-10-CM

## 2014-08-02 LAB — BASIC METABOLIC PANEL
ANION GAP: 14 (ref 5–15)
BUN: 19 mg/dL (ref 6–23)
CO2: 26 mEq/L (ref 19–32)
Calcium: 8.2 mg/dL — ABNORMAL LOW (ref 8.4–10.5)
Chloride: 99 mEq/L (ref 96–112)
Creatinine, Ser: 1.28 mg/dL — ABNORMAL HIGH (ref 0.50–1.10)
GFR, EST AFRICAN AMERICAN: 46 mL/min — AB (ref >90)
GFR, EST NON AFRICAN AMERICAN: 40 mL/min — AB (ref >90)
Glucose, Bld: 220 mg/dL — ABNORMAL HIGH (ref 70–99)
POTASSIUM: 3.3 meq/L — AB (ref 3.7–5.3)
Sodium: 139 mEq/L (ref 137–147)

## 2014-08-02 LAB — GLUCOSE, CAPILLARY
GLUCOSE-CAPILLARY: 271 mg/dL — AB (ref 70–99)
Glucose-Capillary: 100 mg/dL — ABNORMAL HIGH (ref 70–99)
Glucose-Capillary: 151 mg/dL — ABNORMAL HIGH (ref 70–99)
Glucose-Capillary: 152 mg/dL — ABNORMAL HIGH (ref 70–99)

## 2014-08-02 LAB — CBC
HCT: 31.1 % — ABNORMAL LOW (ref 36.0–46.0)
Hemoglobin: 10.5 g/dL — ABNORMAL LOW (ref 12.0–15.0)
MCH: 29.6 pg (ref 26.0–34.0)
MCHC: 33.8 g/dL (ref 30.0–36.0)
MCV: 87.6 fL (ref 78.0–100.0)
PLATELETS: 261 10*3/uL (ref 150–400)
RBC: 3.55 MIL/uL — AB (ref 3.87–5.11)
RDW: 12.9 % (ref 11.5–15.5)
WBC: 7.3 10*3/uL (ref 4.0–10.5)

## 2014-08-02 LAB — LIPID PANEL
Cholesterol: 180 mg/dL (ref 0–200)
HDL: 62 mg/dL (ref >39)
LDL Cholesterol: 91 mg/dL (ref 0–99)
Total CHOL/HDL Ratio: 2.9 RATIO
Triglycerides: 133 mg/dL (ref <150)
VLDL: 27 mg/dL (ref 0–40)

## 2014-08-02 LAB — HEMOGLOBIN A1C
HEMOGLOBIN A1C: 9.2 % — AB (ref <5.7)
MEAN PLASMA GLUCOSE: 217 mg/dL — AB (ref <117)

## 2014-08-02 MED ORDER — METOPROLOL TARTRATE 1 MG/ML IV SOLN
5.0000 mg | INTRAVENOUS | Status: DC | PRN
  Administered 2014-08-02: 5 mg via INTRAVENOUS
  Filled 2014-08-02: qty 5

## 2014-08-02 MED ORDER — HYDRALAZINE HCL 50 MG PO TABS
100.0000 mg | ORAL_TABLET | Freq: Three times a day (TID) | ORAL | Status: DC
  Administered 2014-08-02 – 2014-08-05 (×6): 100 mg via ORAL
  Filled 2014-08-02 (×11): qty 2

## 2014-08-02 MED ORDER — INSULIN GLARGINE 100 UNIT/ML ~~LOC~~ SOLN
13.0000 [IU] | Freq: Every day | SUBCUTANEOUS | Status: DC
  Administered 2014-08-03 – 2014-08-05 (×3): 13 [IU] via SUBCUTANEOUS
  Filled 2014-08-02 (×3): qty 0.13

## 2014-08-02 MED ORDER — HALOPERIDOL LACTATE 5 MG/ML IJ SOLN
4.0000 mg | Freq: Four times a day (QID) | INTRAMUSCULAR | Status: DC
  Administered 2014-08-02 – 2014-08-03 (×3): 4 mg via INTRAVENOUS
  Filled 2014-08-02 (×3): qty 1
  Filled 2014-08-02 (×4): qty 0.8

## 2014-08-02 MED ORDER — SODIUM CHLORIDE 0.9 % IV SOLN
INTRAVENOUS | Status: DC
  Administered 2014-08-02: 75 mL/h via INTRAVENOUS
  Administered 2014-08-03: 07:00:00 via INTRAVENOUS
  Administered 2014-08-04: 1000 mL via INTRAVENOUS

## 2014-08-02 NOTE — Progress Notes (Signed)
[facility] TEAM 1 - Stepdown/ICU TEAM Progress Note  [name] [name]:[id number] DOB: April 26, 1941 DOA: 07/24/2014 PCP: Alva Garnet., MD  Admit HPI / Brief Narrative: 73 year old BF PMHx HTN,COPD, Hx CVA, DM Type 2, CKD who presents from home after being found to be in acute respiratory distress. Please note, pt is on BiPAP and cannot provide history. Husband is at bedside but is a very poor historian. Per report, pt was in her usual state of health until the AM of admission when pt was noted to have increased respiratory effort at home. Pt normally uses "puffers" however she had run out and no longer has any at home. Pt was brought to the ED where a cxr was notable for a LUL pna. Pt was started on empiric abx. Also, pt was noted to have glucose over 800's. Insulin gtt was started. An abg was notable for pH of 7.25 with PCO2 of 72. Pt was cont on BiPAP with repeat pH of 7.29 and PCO2 of 63.   HPI/Subjective: 9/27 A/O. x3 (does not know when), follows commands and answers appropriately  Assessment/Plan: Acute hypercarbic respiratory failure -Resolved  LUL HCAP, allergic to penicillin  -Completed 7 day course of antibiotics   Aspiration risk  -Patient passed modified barium swallow on 9/26  -Start dysphagia 1 diet with nectar thick liquid   Hypertension uncontrolled -Inc Coreg to 25 mg BID -Inc Hydralazine to 25 mg q 4hr PRN SBP >180 -Increase hydralazine 100 mg TID -Start Catapres patch 0.2 mg -Continue Lasix 40 mg daily -Obtain renovascular ultrasound, PTH, aldosterone+ Renin evaluate for secondary HTN cause in patient with significant difficulty controlling BPs  ACUTE ON CHRONIC KIDNEY FAILURE stage III (baseline Cr 1.06-1.20) -creatinine almost at baseline; patient not eating and drinking (dysphagia 1 diet) -Avoid nephrotoxins  -Creatinine close to baseline; 9/27 Cr= 1.28  Hypernatremia  -Resolved    Diabetes type 2; DKA versus HHNS -Resolved  -Dysphagia 1; carb  modified   -Increase lantus 13 units daily   -Continue resistant SSI   Acute encephalopathy -Multifactorial, hypercarbia, infection and sedating medications, per notes improved. Per notes (son says walked with help and conversed after stroke, but needed 24 hour care in SNF and failed at home).  -Minimize sedating meds (no benzodiazepines)  -Frequent orientation  -Increase Haldol 4 mg  QID  -Consult PT/OT for CIR vs SNF   Hypothyroidism -Continue Synthroid 50 mcg -TSH/FREE T4  WNL,     Code Status: FULL Family Communication: no family present at time of exam Disposition Plan: SNF vs CIR vs home   Consultants: Dr. Max Fickle [facility] M.)  Procedure/Significant Events: 9/18 head CT-no mass shift or bleed-old right basal ganglia/ bilateral thalamic lacunar infarct  9/18 intubated  9/19 off insulin gtt  9/21 extubated  9/22 re-intubated for poor mental status  9/23 extubated   Culture 9/18 blood x2 positive1/2 coag neg staph  9/18 urine  negative 9/18 Sputum negative 9/18 MRSA by PCR negative   Antibiotics: Aztreonam, 9/18 >> stopped 9/24   Vancomycin 9/18 > stopped 9/21   DVT prophylaxis: Heparin subcutaneous   Devices    LINES / TUBES:      Continuous Infusions: . dextrose 75 mL (08/02/14 1249)    Objective: VITAL SIGNS: Temp: 97.7 F (36.5 C) (09/27 1600) Temp src: Oral (09/27 1600) BP: 152/60 mmHg (09/27 1600) Pulse Rate: 81 (09/27 1600) SPO2; 100% on room air FIO2:   Intake/Output Summary (Last 24 hours) at 08/02/14 1754 Last data filed at 08/02/14  1600  Gross per 24 hour  Intake   2475 ml  Output      0 ml  Net   2475 ml     Exam: General: A./O. x3 (does not know when,), mildly agitated, No acute respiratory distress Lungs: Clear to auscultation bilaterally without wheezes or crackles Cardiovascular: Regular rate and rhythm without murmur gallop or rub normal S1 and S2 Abdomen: Nontender, nondistended, soft, bowel sounds  positive, no rebound, no ascites, no appreciable mass Extremities: No significant cyanosis, clubbing, or edema bilateral lower extremities Neurologic; cranial nerve II through XII intact, facial droop improved, slurred speech improved , Moves all extremities to command, sensation appears intact throughout, did not ambulate patient.  Data Reviewed: Basic Metabolic Panel:  Recent Labs Lab 07/28/14 0425 07/29/14 0409 07/30/14 0430 07/30/14 1200 07/31/14 0400 08/02/14 0300  NA 146 148*  --  149* 148* 139  K 3.2* 3.0*  --  3.7 3.6* 3.3*  CL 105 108  --  108 106 99  CO2 30 29  --  GLUCOSE 111* 156*  --  166* 84 220*  BUN 46* 47*  --  37* 31* 19  CREATININE 1.26* 1.30*  --  1.30* 1.27* 1.28*  CALCIUM 8.5 8.3*  --  8.5 8.6 8.2*  MG  --  2.0 2.0  --  2.0  --    Liver Function Tests: No results found for this basename: AST, ALT, ALKPHOS, BILITOT, PROT, ALBUMIN,  in the last 168 hours No results found for this basename: LIPASE, AMYLASE,  in the last 168 hours No results found for this basename: AMMONIA,  in the last 168 hours CBC:  Recent Labs Lab 07/27/14 0415 07/29/14 0409 08/02/14 0300  WBC 7.8 7.1 7.3  HGB 9.8* 9.5* 10.5*  HCT 30.8* 28.8* 31.1*  MCV 90.9 89.2 87.6  PLT 178 173 261   Cardiac Enzymes: No results found for this basename: CKTOTAL, CKMB, CKMBINDEX, TROPONINI,  in the last 168 hours BNP (last 3 results)  Recent Labs  06/09/14 1344 06/12/14 1330 07/24/14 1000  PROBNP 1271.0* 1666.0* 6064.0*   CBG:  Recent Labs Lab 08/01/14 1712 08/01/14 2128 08/02/14 0837 08/02/14 1134 08/02/14 1720  GLUCAP 157* 152* 271* 100* 151*    Recent Results (from the past 240 hour(s))  CULTURE, BLOOD (ROUTINE X 2)     Status: None   Collection Time    07/24/14 10:00 AM      Result Value Ref Range Status   Specimen Description BLOOD LEFT ANTECUBITAL   Final   Special Requests BOTTLES DRAWN AEROBIC AND ANAEROBIC   Final   Culture  Setup Time     Final    Value: 07/24/2014 14:55     Performed at [facility]   Culture     Final   Value: STAPHYLOCOCCUS SPECIES (COAGULASE NEGATIVE)     Note: THE SIGNIFICANCE OF ISOLATING THIS ORGANISM FROM A SINGLE SET OF BLOOD CULTURES WHEN MULTIPLE SETS ARE DRAWN IS UNCERTAIN. PLEASE NOTIFY THE MICROBIOLOGY DEPARTMENT WITHIN ONE WEEK IF SPECIATION AND SENSITIVITIES ARE REQUIRED.     Note: Gram Stain Report Called to,Read Back By and Verified With: CELENIA SOSA @ 1043 ON [id number] BY [facility]     Performed at [facility]   Report Status 07/26/2014 FINAL   Final  CULTURE, BLOOD (ROUTINE X 2)     Status: None   Collection Time    07/24/14 10:10 AM      Result Value  Ref Range Status   Specimen Description BLOOD RIGHT ANTECUBITAL   Final   Special Requests BOTTLES DRAWN AEROBIC AND ANAEROBIC   Final   Culture  Setup Time     Final   Value: 07/24/2014 14:55     Performed at [facility]   Culture     Final   Value: NO GROWTH 5 DAYS     Performed at [facility]   Report Status 07/30/2014 FINAL   Final  URINE CULTURE     Status: None   Collection Time    07/24/14 12:50 PM      Result Value Ref Range Status   Specimen Description URINE, CATHETERIZED   Final   Special Requests NONE   Final   Culture  Setup Time     Final   Value: 07/24/2014 13:54     Performed at [facility] Count     Final   Value: NO GROWTH     Performed at [facility]   Culture     Final   Value: NO GROWTH     Performed at [facility]   Report Status 07/25/2014 FINAL   Final  MRSA PCR SCREENING     Status: None   Collection Time    07/24/14  6:06 PM      Result Value Ref Range Status   MRSA by PCR NEGATIVE  NEGATIVE Final   Comment:            The GeneXpert MRSA Assay (FDA     approved for NASAL specimens     only), is one component of a     comprehensive MRSA colonization     surveillance program. It is not     intended to diagnose MRSA     infection nor to  guide or     monitor treatment for     MRSA infections.  CULTURE, RESPIRATORY (NON-EXPECTORATED)     Status: None   Collection Time    07/24/14  8:47 PM      Result Value Ref Range Status   Specimen Description TRACHEAL ASPIRATE   Final   Special Requests NONE   Final   Gram Stain     Final   Value: NO WBC SEEN     RARE SQUAMOUS EPITHELIAL CELLS PRESENT     NO ORGANISMS SEEN     Performed at [facility]   Culture     Final   Value: Non-Pathogenic Oropharyngeal-type Flora Isolated.     Performed at [facility]   Report Status 07/27/2014 FINAL   Final     Studies:  Recent x-ray studies have been reviewed in detail by the Attending Physician  Scheduled Meds:  Scheduled Meds: . antiseptic oral rinse  7 mL Mouth Rinse BID  . atorvastatin  40 mg Oral q1800  . carvedilol  25 mg Oral BID WC  . cloNIDine  0.2 mg Transdermal Weekly  . furosemide  40 mg Intravenous Daily  . haloperidol lactate  4 mg Intravenous Q6H  . heparin  5,000 Units Subcutaneous 3 times per day  . hydrALAZINE  75 mg Oral TID  . insulin aspart  0-20 Units Subcutaneous TID WC  . insulin glargine  10 Units Subcutaneous Daily  . ipratropium-albuterol  3 mL Nebulization Q6H  . levothyroxine  50 mcg Oral QAC breakfast  . nitroGLYCERIN  1 inch Topical 3 times per day  . predniSONE  20 mg Per Tube Q breakfast  . sodium chloride  3 mL Intravenous Q12H    Time spent on care of this patient: 40 mins   Drema Dallas , MD   [facility] Office  [phone] Pager - [phone]  On-Call/Text Page:      [email or web address]      password [email or web address]  If 7PM-7AM, please contact night-coverage [email or web address] Password [email or web address] 08/02/2014, 5:54 PM   LOS: 9 days

## 2014-08-03 DIAGNOSIS — I10 Essential (primary) hypertension: Secondary | ICD-10-CM

## 2014-08-03 LAB — BASIC METABOLIC PANEL
Anion gap: 10 (ref 5–15)
BUN: 17 mg/dL (ref 6–23)
CO2: 29 mEq/L (ref 19–32)
CREATININE: 1.33 mg/dL — AB (ref 0.50–1.10)
Calcium: 8.1 mg/dL — ABNORMAL LOW (ref 8.4–10.5)
Chloride: 104 mEq/L (ref 96–112)
GFR calc Af Amer: 44 mL/min — ABNORMAL LOW (ref >90)
GFR calc non Af Amer: 38 mL/min — ABNORMAL LOW (ref >90)
GLUCOSE: 163 mg/dL — AB (ref 70–99)
Potassium: 3.1 mEq/L — ABNORMAL LOW (ref 3.7–5.3)
SODIUM: 143 meq/L (ref 137–147)

## 2014-08-03 LAB — AMMONIA: Ammonia: 21 umol/L (ref 11–60)

## 2014-08-03 LAB — MAGNESIUM: Magnesium: 2 mg/dL (ref 1.5–2.5)

## 2014-08-03 LAB — FOLATE: Folate: >20 ng/mL

## 2014-08-03 LAB — GLUCOSE, CAPILLARY
GLUCOSE-CAPILLARY: 88 mg/dL (ref 70–99)
Glucose-Capillary: 150 mg/dL — ABNORMAL HIGH (ref 70–99)
Glucose-Capillary: 114 mg/dL — ABNORMAL HIGH (ref 70–99)
Glucose-Capillary: 106 mg/dL — ABNORMAL HIGH (ref 70–99)

## 2014-08-03 LAB — VITAMIN B12: VITAMIN B 12: 1024 pg/mL — AB (ref 211–911)

## 2014-08-03 LAB — PTH, INTACT AND CALCIUM
CALCIUM TOTAL (PTH): 8.5 mg/dL (ref 8.4–10.5)
PTH: 84 pg/mL — AB (ref 14–64)

## 2014-08-03 MED ORDER — IPRATROPIUM-ALBUTEROL 0.5-2.5 (3) MG/3ML IN SOLN
3.0000 mL | Freq: Four times a day (QID) | RESPIRATORY_TRACT | Status: DC
  Administered 2014-08-03 – 2014-08-05 (×9): 3 mL via RESPIRATORY_TRACT
  Filled 2014-08-03 (×10): qty 3

## 2014-08-03 MED ORDER — POTASSIUM CHLORIDE 10 MEQ/100ML IV SOLN
10.0000 meq | INTRAVENOUS | Status: AC
  Administered 2014-08-03 (×5): 10 meq via INTRAVENOUS
  Filled 2014-08-03 (×4): qty 100

## 2014-08-03 MED ORDER — ALBUTEROL SULFATE (2.5 MG/3ML) 0.083% IN NEBU: 2.5000 mg | INHALATION_SOLUTION | RESPIRATORY_TRACT | Status: DC | PRN

## 2014-08-03 NOTE — Progress Notes (Signed)
Speech Language Pathology Treatment: Dysphagia  Patient Details Name: [name] [name] MRN: [id number] DOB: [birth year] Today's Date: 08/03/2014 Time: 1191-4782 SLP Time Calculation (min): 20 min  Assessment / Plan / Recommendation Clinical Impression  Pt. Lethargic, RN reports pt. was receiving scheduled Haldol.  She needed repeated verbal and tactile cues to remain awake.  Total assist for feeding and max cues to demonstrate strategies.  Multiple swallows observed and delayed cough near end of session with increased aspiration risk while lethargic.  Recommend continue Dys 1, nectar only when alert with swallow precautions and continued ST for 1-2 more sessions.   HPI HPI: [name] with COPD, CVA, CK D., Diabetes admitted 9/18 with acute respiratory distress , hypercarbic respiratory failure, DKA with glucose of 802 and anion gap of 19 , requiring mechanical ventilation. Chest x-ray showed left upper lobe infiltrate. Was placed on BiPAP initially, but unresponsive on arrival to the ICU, hence intubated. She had recent admit from 8/6 to 814 for hypercarbic failure that improved with BiPAP-dc'd to NH , then home since 9/14. 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 Patient has been seen by SLP during previous admissions and has been observed with a delayed oral phase and suspected delayed swallow initiation with s/s of aspiration with thin liquids via straw. Most frequent diet recommendations for dysphagia 1 solids, thin liquids.    Pertinent Vitals Pain Assessment: No/denies pain  SLP Plan  Continue with current plan of care    Recommendations Diet recommendations: Dysphagia 1 (puree);Nectar-thick liquid Liquids provided via: Cup;No straw Medication Administration: Crushed with puree Supervision: Staff to assist with self feeding;Full supervision/cueing for compensatory strategies Compensations: Slow rate;Small sips/bites;Check for pocketing Postural Changes and/or Swallow  Maneuvers: Seated upright 90 degrees;Upright 30-60 min after meal              Oral Care Recommendations: Oral care BID Follow up Recommendations: Skilled Nursing facility Plan: Continue with current plan of care    GO     [name] 08/03/2014, 10:41 AM  [name] [employer] [phone]

## 2014-08-03 NOTE — Progress Notes (Signed)
PT Cancellation Note  Patient Details Name: VIRTIE BUNGERT MRN: [id number] DOB: 1940-12-29   Cancelled Treatment:    Reason Eval/Treat Not Completed: Patient's level of consciousness. Pt sleeping upon PT arrival and would not arouse to voice, touch, or pain. Will check back tomorrow.   Ruthann Cancer 08/03/2014, 4:12 PM  Ruthann Cancer, PT, DPT Acute Rehabilitation Services Pager: [phone]

## 2014-08-03 NOTE — Progress Notes (Addendum)
[facility] TEAM 1 - Stepdown/ICU TEAM Progress Note  [name] [name] BHA:[id number] DOB: 1940-12-15 DOA: 07/24/2014 PCP: Alva Garnet., MD  Admit HPI / Brief Narrative: 73 year old F Hx HTN, COPD, CVA, DM2, and CKD who presented from home after being found to be in acute respiratory distress. Pt was in her usual state of health until the AM of admission when she was noted to have increased respiratory effort at home. Pt normally uses "puffers" she ran out and no longer had any at home. Pt was brought to the ED where a CXR was notable for a LUL pna. Also, pt was noted to have glucose over 800.  Abg was notable for pH of 7.25 with PCO2 of 72. Pt was placed on BiPAP with repeat pH of 7.29 and PCO2 of 63.  Significant Events: 9/18 head CT-no mass shift or bleed-old right basal ganglia/ bilateral thalamic lacunar infarct 9/18 intubated  9/19 off insulin gtt  9/21 extubated  9/22 re-intubated for poor mental status  9/23 extubated 2nd time   HPI/Subjective: Pt is unresponsive.  She will open her eyes to the examiner, but will not follow commands.  There is no family present.    Assessment/Plan:  Acute encephalopathy multifactorial:  hypercarbia, infection, and sedating medications, w/ possible component of sundowning - this morning I am concerned about hypercarbic failure again so ABG has been ordered - obtain complete metabolic w/u - per PCCM son says walked with help and conversed after stroke, but needed 24 hour care in SNF and failed at home - consult PT/OT for CIR vs SNF   Acute hypercarbic respiratory failure - suspected OHS/SA Rechecking ABG this AM   LUL HCAP, allergic to penicillin  Completed 7 day course of antibiotics - no evidence of persisting infection  Aspiration risk  Patient passed modified barium swallow on 9/26 - dysphagia 1 diet with nectar thick liquid when awake   Hypertension uncontrolled multiple medication adjustments have been required thus far - likely an  element of agitation playing a role as well - obtain renovascular ultrasound, PTH, aldosterone + renin evaluate for secondary HTN cause in patient with significant difficulty controlling BP  ACUTE ON CHRONIC KIDNEY FAILURE stage III  baseline Cr 1.06-1.20 - creatinine almost at baseline - avoid nephrotoxins   Hypernatremia  Resolved    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  Code Status: FULL Family Communication: no family present at time of exam Disposition Plan: SNF vs CIR vs home  Consultants: Dr. Max Fickle (PCCM)  Antibiotics: Aztreonam, 9/18 > 9/24   Vancomycin 9/18 > 9/21  DVT prophylaxis: Heparin subcutaneous  Objective: Blood pressure 160/51, pulse 74, temperature 97.6 F (36.4 C), temperature source Oral, resp. rate 21, height  (1.6 m), weight 77.6 kg (171 lb 1.2 oz), SpO2 98.00%.  Intake/Output Summary (Last 24 hours) at 08/03/14 0826 Last data filed at 08/03/14 0600  Gross per 24 hour  Intake   1710 ml  Output      0 ml  Net   1710 ml   Exam: General: obtunded - opens eyes only - respirations shallow  Lungs: clear to auscultation bilaterally without  wheezes or crackles but poor air movement th/o  Cardiovascular: regular rate and rhythm without murmur gallop or rub normal S1 and S2 Abdomen: nontender, nondistended, soft, bowel sounds positive, no rebound, no ascites, no appreciable mass Extremities: No significant cyanosis, clubbing, or edema bilateral lower extremities Neurologic; obtunded - does not follow commands - no obvious facial asymmetry   Data Reviewed: Basic Metabolic Panel:  Recent Labs Lab 07/28/14 0425  07/29/14 0409 07/30/14 0430 07/30/14 1200 07/31/14 0400 08/02/14 0300  NA 146 148*  --  149* 148* 139  K 3.2* 3.0*  --  3.7 3.6* 3.3*  CL 105 108  --  108 106 99  CO2 30 29  --  GLUCOSE 111* 156*  --  166* 84 220*  BUN 46* 47*  --  37* 31* 19  CREATININE 1.26* 1.30*  --  1.30* 1.27* 1.28*  CALCIUM 8.5 8.3*  --  8.5 8.6 8.2*  MG  --  2.0 2.0  --  2.0  --    Liver Function Tests: No results found for this basename: AST, ALT, ALKPHOS, BILITOT, PROT, ALBUMIN,  in the last 168 hours  CBC:  Recent Labs Lab 07/29/14 0409 08/02/14 0300  WBC 7.1 7.3  HGB 9.5* 10.5*  HCT 28.8* 31.1*  MCV 89.2 87.6  PLT 173 261   CBG:  Recent Labs Lab 08/02/14 0837 08/02/14 1134 08/02/14 1720 08/02/14 2105 08/03/14 0809  GLUCAP 271* 100* 151* 152* 150*    Recent Results (from the past 240 hour(s))  CULTURE, BLOOD (ROUTINE X 2)     Status: None   Collection Time    07/24/14 10:00 AM      Result Value Ref Range Status   Specimen Description BLOOD LEFT ANTECUBITAL   Final   Special Requests BOTTLES DRAWN AEROBIC AND ANAEROBIC   Final   Culture  Setup Time     Final   Value: 07/24/2014 14:55     Performed at [facility]   Culture     Final   Value: STAPHYLOCOCCUS SPECIES (COAGULASE NEGATIVE)     Note: THE SIGNIFICANCE OF ISOLATING THIS ORGANISM FROM A SINGLE SET OF BLOOD CULTURES WHEN MULTIPLE SETS ARE DRAWN IS UNCERTAIN. PLEASE NOTIFY THE MICROBIOLOGY DEPARTMENT WITHIN ONE WEEK IF SPECIATION AND SENSITIVITIES ARE REQUIRED.     Note: Gram Stain Report Called to,Read Back By and Verified With: CELENIA SOSA @ 1043 ON [id number] BY [facility]     Performed at [facility]   Report Status 07/26/2014 FINAL   Final  CULTURE, BLOOD (ROUTINE X 2)     Status: None   Collection Time    07/24/14 10:10 AM      Result Value Ref Range Status   Specimen Description BLOOD RIGHT ANTECUBITAL   Final   Special Requests BOTTLES DRAWN AEROBIC AND ANAEROBIC   Final   Culture   Setup Time     Final   Value: 07/24/2014 14:55     Performed at [facility]   Culture     Final   Value: NO GROWTH 5 DAYS     Performed at [facility]   Report Status 07/30/2014 FINAL   Final  URINE CULTURE     Status: None   Collection Time    07/24/14 12:50 PM      Result Value Ref Range Status   Specimen Description URINE, CATHETERIZED   Final   Special Requests NONE   Final  Culture  Setup Time     Final   Value: 07/24/2014 13:54     Performed at [facility] Count     Final   Value: NO GROWTH     Performed at [facility]   Culture     Final   Value: NO GROWTH     Performed at [facility]   Report Status 07/25/2014 FINAL   Final  MRSA PCR SCREENING     Status: None   Collection Time    07/24/14  6:06 PM      Result Value Ref Range Status   MRSA by PCR NEGATIVE  NEGATIVE Final   Comment:            The GeneXpert MRSA Assay (FDA     approved for NASAL specimens     only), is one component of a     comprehensive MRSA colonization     surveillance program. It is not     intended to diagnose MRSA     infection nor to guide or     monitor treatment for     MRSA infections.  CULTURE, RESPIRATORY (NON-EXPECTORATED)     Status: None   Collection Time    07/24/14  8:47 PM      Result Value Ref Range Status   Specimen Description TRACHEAL ASPIRATE   Final   Special Requests NONE   Final   Gram Stain     Final   Value: NO WBC SEEN     RARE SQUAMOUS EPITHELIAL CELLS PRESENT     NO ORGANISMS SEEN     Performed at [facility]   Culture     Final   Value: Non-Pathogenic Oropharyngeal-type Flora Isolated.     Performed at [facility]   Report Status 07/27/2014 FINAL   Final     Studies:  Recent x-ray studies have been reviewed in detail by the Attending Physician  Scheduled Meds:  Scheduled Meds: . antiseptic oral rinse  7 mL Mouth Rinse BID  . atorvastatin  40 mg Oral q1800  . carvedilol  25 mg  Oral BID WC  . cloNIDine  0.2 mg Transdermal Weekly  . furosemide  40 mg Intravenous Daily  . haloperidol lactate  4 mg Intravenous Q6H  . heparin  5,000 Units Subcutaneous 3 times per day  . hydrALAZINE  100 mg Oral TID  . insulin aspart  0-20 Units Subcutaneous TID WC  . insulin glargine  13 Units Subcutaneous Daily  . ipratropium-albuterol  3 mL Nebulization Q6H  . levothyroxine  50 mcg Oral QAC breakfast  . nitroGLYCERIN  1 inch Topical 3 times per day  . predniSONE  20 mg Per Tube Q breakfast  . sodium chloride  3 mL Intravenous Q12H   Time spent on care of this patient: 35 mins  Lonia Blood, MD [facility] For Consults/Admissions - Flow Manager - [phone] Office  [phone] Pager [phone]  On-Call/Text Page:      [email or web address]      password [facility]  08/03/2014, 8:26 AM   LOS: 10 days

## 2014-08-03 NOTE — Evaluation (Signed)
Occupational Therapy Evaluation Patient Details Name: [name] [name] MRN: [id number] DOB: May 15, 1941 Today's Date: 08/03/2014    History of Present Illness 73 yo F with prior h/o hypertension, DM, CVA, and stage 2 CKD, who was brought in by EMS for altered mental status, found to be acidotic upon admission.   Clinical Impression   Pt requiring total assist for grooming, feeding, and bed mobility this visit. Pt also dependent in bathing and dressing and is incontinent per nursing staff. She demonstrates no functional use of UEs and intermittently responds with yes/no.  Pt will need SNF upon d/c, family is unable to provide care for pt.  Will defer OT to SNF.    Follow Up Recommendations  SNF;Supervision/Assistance - 24 hour    Equipment Recommendations       Recommendations for Other Services       Precautions / Restrictions Precautions Precautions: Fall Restrictions Weight Bearing Restrictions: No      Mobility Bed Mobility Overal bed mobility: Needs Assistance;+2 for physical assistance Bed Mobility: Rolling Rolling: +2 for physical assistance;Max assist            Transfers                      Balance                                            ADL Overall ADL's : Needs assistance/impaired Eating/Feeding: Total assistance;Bed level   Grooming: Total assistance;Bed level   Upper Body Bathing: Total assistance;Bed level   Lower Body Bathing: Total assistance;Bed level   Upper Body Dressing : Total assistance;Bed level   Lower Body Dressing: Total assistance;Bed level               Functional mobility during ADLs: +2 for physical assistance General ADL Comments: Pt assisted with lunch meal. Ate 3-4 bites of food and 1/2 of thickened milk carton.  Did not make choices of what she wanted next. Responded yes and no initially, but as meal progressed she was less communicative.     Vision                      Perception     Praxis      Pertinent Vitals/Pain Pain Assessment: No/denies pain     Hand Dominance Right   Extremity/Trunk Assessment Upper Extremity Assessment Upper Extremity Assessment: RUE deficits/detail;LUE deficits/detail RUE Deficits / Details: pt with stiffness, seems to resist efforts of PROM  RUE Coordination: decreased fine motor;decreased gross motor LUE Deficits / Details: pt with stiffness, seems to resist attempts of PROM LUE Coordination: decreased fine motor;decreased gross motor   Lower Extremity Assessment Lower Extremity Assessment: Defer to PT evaluation       Communication Communication Communication: Expressive difficulties;HOH   Cognition Arousal/Alertness: Lethargic Behavior During Therapy: Flat affect Overall Cognitive Status: Impaired/Different from baseline Area of Impairment: Orientation;Following commands;Safety/judgement;Awareness;Problem solving Orientation Level: Disoriented to;Time;Situation     Following Commands: Follows one step commands inconsistently;Follows one step commands with increased time Safety/Judgement: Decreased awareness of deficits Awareness: Intellectual Problem Solving: Requires verbal cues;Requires tactile cues     General Comments       Exercises       Shoulder Instructions      Home Living Family/patient expects to be discharged to:: Skilled nursing facility  Additional Comments: per RN, family is not able to manage pt at home      Prior Functioning/Environment Level of Independence: Needs assistance  Gait / Transfers Assistance Needed: Assist to transfer and for ambulation with RW ADL's / Homemaking Assistance Needed: Assist for bathing/dressing, housekeeping, and meal prep.    Comments: Patient legally blind    OT Diagnosis: Generalized weakness;Cognitive deficits;Blindness and low vision   OT Problem List: Decreased strength;Decreased range of  motion;Decreased activity tolerance;Decreased coordination;Decreased cognition;Obesity;Impaired UE functional use;Impaired vision/perception   OT Treatment/Interventions:      OT Goals(Current goals can be found in the care plan section) Acute Rehab OT Goals Patient Stated Goal: unable  OT Frequency:     Barriers to D/C:            Co-evaluation              End of Session Nurse Communication:  (pt's status, food intake)  Activity Tolerance: Patient limited by lethargy Patient left: in bed;with call bell/phone within reach   Time: 1354-1446 OT Time Calculation (min): 52 min Charges:  OT General Charges $OT Visit: 1 Procedure OT Evaluation $Initial OT Evaluation Tier I: 1 Procedure OT Treatments $Self Care/Home Management : 23-37 mins G-Codes:    Evern Bio 08/03/2014, 2:49 PM [phone]

## 2014-08-03 NOTE — Progress Notes (Signed)
*  PRELIMINARY RESULTS* Vascular Ultrasound Renal Artery Duplex has been completed.  Preliminary findings: Technically limited due to body habitus and patient movement. No obvious evidence of renal artery stenosis.  Farrel Demark, RDMS, RVT  08/03/2014, 1:46 PM

## 2014-08-04 ENCOUNTER — Inpatient Hospital Stay (HOSPITAL_COMMUNITY): Payer: Medicare HMO

## 2014-08-04 DIAGNOSIS — E118 Type 2 diabetes mellitus with unspecified complications: Secondary | ICD-10-CM

## 2014-08-04 DIAGNOSIS — IMO0002 Reserved for concepts with insufficient information to code with codable children: Secondary | ICD-10-CM

## 2014-08-04 DIAGNOSIS — E1165 Type 2 diabetes mellitus with hyperglycemia: Secondary | ICD-10-CM

## 2014-08-04 LAB — COMPREHENSIVE METABOLIC PANEL WITH GFR
ALT: 29 U/L (ref 0–35)
AST: 40 U/L — ABNORMAL HIGH (ref 0–37)
Albumin: 2.3 g/dL — ABNORMAL LOW (ref 3.5–5.2)
Alkaline Phosphatase: 71 U/L (ref 39–117)
Anion gap: 14 (ref 5–15)
BUN: 17 mg/dL (ref 6–23)
CO2: 24 meq/L (ref 19–32)
Calcium: 8.1 mg/dL — ABNORMAL LOW (ref 8.4–10.5)
Chloride: 106 meq/L (ref 96–112)
Creatinine, Ser: 1.33 mg/dL — ABNORMAL HIGH (ref 0.50–1.10)
GFR calc Af Amer: 44 mL/min — ABNORMAL LOW
GFR calc non Af Amer: 38 mL/min — ABNORMAL LOW
Glucose, Bld: 92 mg/dL (ref 70–99)
Potassium: 3.4 meq/L — ABNORMAL LOW (ref 3.7–5.3)
Sodium: 144 meq/L (ref 137–147)
Total Bilirubin: <0.2 mg/dL — ABNORMAL LOW (ref 0.3–1.2)
Total Protein: 5.8 g/dL — ABNORMAL LOW (ref 6.0–8.3)

## 2014-08-04 LAB — POCT I-STAT 3, ART BLOOD GAS (G3+)
Acid-Base Excess: 3 mmol/L — ABNORMAL HIGH (ref 0.0–2.0)
Bicarbonate: 28.1 mEq/L — ABNORMAL HIGH (ref 20.0–24.0)
O2 SAT: 94 %
PCO2 ART: 44.1 mmHg (ref 35.0–45.0)
Patient temperature: 98.6
TCO2: 29 mmol/L (ref 0–100)
pH, Arterial: 7.412 (ref 7.350–7.450)
pO2, Arterial: 70 mmHg — ABNORMAL LOW (ref 80.0–100.0)

## 2014-08-04 LAB — CBC
HCT: 29.1 % — ABNORMAL LOW (ref 36.0–46.0)
Hemoglobin: 9.6 g/dL — ABNORMAL LOW (ref 12.0–15.0)
MCH: 29.3 pg (ref 26.0–34.0)
MCHC: 33 g/dL (ref 30.0–36.0)
MCV: 88.7 fL (ref 78.0–100.0)
Platelets: 300 10*3/uL (ref 150–400)
RBC: 3.28 MIL/uL — ABNORMAL LOW (ref 3.87–5.11)
RDW: 13.2 % (ref 11.5–15.5)
WBC: 6 10*3/uL (ref 4.0–10.5)

## 2014-08-04 LAB — GLUCOSE, CAPILLARY
GLUCOSE-CAPILLARY: 102 mg/dL — AB (ref 70–99)
Glucose-Capillary: 155 mg/dL — ABNORMAL HIGH (ref 70–99)
Glucose-Capillary: 128 mg/dL — ABNORMAL HIGH (ref 70–99)
Glucose-Capillary: 102 mg/dL — ABNORMAL HIGH (ref 70–99)

## 2014-08-04 LAB — RPR

## 2014-08-04 MED ORDER — CLONIDINE HCL 0.3 MG/24HR TD PTWK
0.3000 mg | MEDICATED_PATCH | TRANSDERMAL | Status: DC
  Administered 2014-08-04: 0.3 mg via TRANSDERMAL
  Filled 2014-08-04: qty 1

## 2014-08-04 MED ORDER — CLONIDINE HCL 0.3 MG/24HR TD PTWK: 0.3000 mg | MEDICATED_PATCH | TRANSDERMAL | Status: DC

## 2014-08-04 NOTE — Progress Notes (Signed)
Clinical Social Work Department BRIEF PSYCHOSOCIAL ASSESSMENT 08/04/2014  Patient:  [name] [name],[name] [name]     Account Number:  [id number]     Admit date:  07/24/2014  Clinical Social Worker:  Harless NakayamaAMBELAL,Betheny Suchecki, LCSWA  Date/Time:  08/04/2014 11:30 AM  Referred by:  Physician  Date Referred:  08/04/2014 Referred for  SNF Placement   Other Referral:   Interview type:  Family Other interview type:   Spoke with pt son over the phone    PSYCHOSOCIAL DATA Living Status:  WITH ADULT CHILDREN Admitted from facility:   Level of care:   Primary support name:  [name] [name] Primary support relationship to patient:  CHILD, ADULT Degree of support available:   Pt has good family support    CURRENT CONCERNS Current Concerns  Post-Acute Placement   Other Concerns:    SOCIAL WORK ASSESSMENT / PLAN CSW notified that pt needing SNF placement. CSW spoke with pt son over the phone and notified of recommendation. Pt son understanding and in agreement. Pt son informed CSW that pt will need long term care placement because he will not be able to provide her with the supervision and asistance she will need. Pt son informed CSW that pt was previously [facility] and family would prefer to her return. However, pt son is agreeable to placement at any facility that will be able to provide pt with what she needs. Pt son agreeable to SNF referral being [place] to [place] [facility] [place]. CSW to call pt son back with update. Pt son notified of potential for dc today. CSW called [facility] and notiifed admissions staff of family preference for pt to return.   Assessment/plan status:  Psychosocial Support/Ongoing Assessment of Needs Other assessment/ plan:   Information/referral to community resources:   SNF list to be provided with bed offers if needed    PATIENT'S/FAMILY'S RESPONSE TO PLAN OF CARE: Pt son agreeable that pt will need SNF at [facility]       Pattiann Solanki, LCSWA [phone]

## 2014-08-04 NOTE — Progress Notes (Signed)
Met with Poonam Ambelal SW.  Patient does not have long term medicaid for long term SNF staff.  SW has alerted [facility] SNF who will complete the application.  Alerted silverback team of pending transfer to ensure that the paperwork is initiated.  Of note, [facility] Care Management services does not replace or interfere with any services that are arranged by inpatient case management or social work.  For additional questions or referrals please contact Corliss Blacker BSN RN [facility] Liaison at [phone].

## 2014-08-04 NOTE — Progress Notes (Signed)
TEAM 1 - Stepdown/ICU TEAM Progress Note  [name] [name]:[id number] DOB: 1941/10/09 DOA: 07/24/2014 PCP: Alva Garnet., MD  Admit HPI / Brief Narrative: 73 year old BF PMHx HTN,COPD, Hx CVA, DM Type 2, CKD who presents from home after being found to be in acute respiratory distress. Please note, pt is on BiPAP and cannot provide history. Husband is at bedside but is a very poor historian. Per report, pt was in her usual state of health until the AM of admission when pt was noted to have increased respiratory effort at home. Pt normally uses "puffers" however she had run out and no longer has any at home. Pt was brought to the ED where a cxr was notable for a LUL pna. Pt was started on empiric abx. Also, pt was noted to have glucose over 800's. Insulin gtt was started. An abg was notable for pH of 7.25 with PCO2 of 72. Pt was cont on BiPAP with repeat pH of 7.29 and PCO2 of 63.   HPI/Subjective: 9/29 A/O. X 0 Non communicative, follows some commands.  Assessment/Plan: Acute hypercarbic respiratory failure -Resolved  LUL HCAP, allergic to penicillin  -Completed 7 day course of antibiotics   Aspiration risk  -Patient passed modified barium swallow on 9/26  -Start dysphagia 1 diet with nectar thick liquid   Hypertension uncontrolled -Inc Coreg to 25 mg BID -Inc Hydralazine to 25 mg q 4hr PRN SBP >180 -Increase hydralazine 100 mg TID -Start Catapres patch 0.3 mg -Continue Lasix 40 mg daily -Renovascular ultrasound; abnormal but not consistent with stenosis see results below, PTH; elevated however calcium low not consistent with hyperparathyroidism, aldosterone+ Renin pending  -On followup would have PCP refer to nephrologist to complete workup  ACUTE ON CHRONIC KIDNEY FAILURE stage III (baseline Cr 1.06-1.20) -creatinine almost at baseline; patient not eating and drinking (dysphagia 1 diet) -Avoid nephrotoxins  -Creatinine close to baseline; 9/27 Cr=  1.28  Hypernatremia  -Resolved    Diabetes type 2; DKA versus HHNS -Resolved  -Dysphagia 1; carb modified   -Increase lantus 13 units daily   -Continue resistant SSI   Acute encephalopathy -Multifactorial, hypercarbia, infection and sedating medications, per notes improved. Per notes (son says walked with help and conversed after stroke, but needed 24 hour care in SNF and failed at home).  -Minimize sedating meds (no benzodiazepines)  -Frequent orientation  -9/29 new-onset neurologic symptoms will obtain stat head CT R./O. new CVA ;NOTE only medication changes was abrupt withdrawal of Haldol; should not cause symptoms as patient was not on the medication long term.. 9/29 Head CT; negative for acute changes patient is off all sedating medication, may be her new baseline waxing and waning cognitive function.  Hypothyroidism -Continue Synthroid 50 mcg -TSH/FREE T4  WNL,     Code Status: FULL Family Communication: no family present at time of exam Disposition Plan: DC to SNF 9/30    Consultants: Dr. [facility]  Procedure/Significant Events: 9/18 head CT-no mass shift or bleed-old right basal ganglia/ bilateral thalamic lacunar infarct 9/18 intubated  9/19 off insulin gtt  9/21 extubated  9/22 re-intubated for poor mental status  9/23 extubated 9/28 renal artery ultrasound;No evidence of renal artery stenosis noted bilaterally. -Bilateral abnormal intrarenal resistive indices. 9/29 CT head without contrast; No acute intracranial abnormality seen.   Culture 9/18 blood x2 positive1/2 coag neg staph  9/18 urine  negative 9/18 Sputum negative 9/18 MRSA by PCR negative   Antibiotics: Aztreonam, 9/18 >> stopped 9/24  Vancomycin 9/18 > stopped 9/21   DVT prophylaxis: Heparin subcutaneous   Devices    LINES / TUBES:      Continuous Infusions: . sodium chloride 1,000 mL (08/04/14 0124)    Objective: VITAL SIGNS: Temp: 97.7 F (36.5 C) (09/29  0728) Temp src: Axillary (09/29 0728) BP: 170/43 mmHg (09/29 0728) Pulse Rate: 70 (09/29 0728) SPO2; 100% on room air FIO2:   Intake/Output Summary (Last 24 hours) at 08/04/14 1056 Last data filed at 08/04/14 0943  Gross per 24 hour  Intake    153 ml  Output      0 ml  Net    153 ml     Exam: General: A./O. x0, mildly agitated, No acute respiratory distress Lungs: Clear to auscultation bilaterally without wheezes or crackles Cardiovascular: Regular rate and rhythm without murmur gallop or rub normal S1 and S2 Abdomen: Nontender, nondistended, soft, bowel sounds positive, no rebound, no ascites, no appreciable mass Extremities: No significant cyanosis, clubbing, or edema bilateral lower extremities Neurologic; cranial nerve II through XII intact, right facial droop worsened, noncommunicating, , Moves all extremities to command except LUE,    Data Reviewed: Basic Metabolic Panel:  Recent Labs Lab 07/29/14 0409 07/30/14 0430 07/30/14 1200 07/31/14 0400 08/02/14 0300 08/02/14 1930 08/03/14 0858 08/03/14 1010 08/04/14 0330  NA 148*  --  149* 148* 139  --  143  --  144  K 3.0*  --  3.7 3.6* 3.3*  --  3.1*  --  3.4*  CL 108  --  108 106 99  --  104  --  106  CO2 29  --  30 28 26   --  29  --  24  GLUCOSE 156*  --  166* 84 220*  --  163*  --  92  BUN 47*  --  37* 31* 19  --  17  --  17  CREATININE 1.30*  --  1.30* 1.27* 1.28*  --  1.33*  --  1.33*  CALCIUM 8.3*  --  8.5 8.6 8.2* 8.5 8.1*  --  8.1*  MG 2.0 2.0  --  2.0  --   --   --  2.0  --    Liver Function Tests:  Recent Labs Lab 08/04/14 0330  AST 40*  ALT 29  ALKPHOS 71  BILITOT <0.2*  PROT 5.8*  ALBUMIN 2.3*   No results found for this basename: LIPASE, AMYLASE,  in the last 168 hours  Recent Labs Lab 08/03/14 1010  AMMONIA 21   CBC:  Recent Labs Lab 07/29/14 0409 08/02/14 0300 08/04/14 0330  WBC 7.1 7.3 6.0  HGB 9.5* 10.5* 9.6*  HCT 28.8* 31.1* 29.1*  MCV 89.2 87.6 88.7  PLT 173 261 300    Cardiac Enzymes: No results found for this basename: CKTOTAL, CKMB, CKMBINDEX, TROPONINI,  in the last 168 hours BNP (last 3 results)  Recent Labs  06/09/14 1344 06/12/14 1330 07/24/14 1000  PROBNP 1271.0* 1666.0* 6064.0*   CBG:  Recent Labs Lab 08/03/14 0809 08/03/14 1208 08/03/14 1633 08/03/14 2129 08/04/14 0727  GLUCAP 150* 114* 106* 88 102*    No results found for this or any previous visit (from the past 240 hour(s)).   Studies:  Recent x-ray studies have been reviewed in detail by the Attending Physician  Scheduled Meds:  Scheduled Meds: . antiseptic oral rinse  7 mL Mouth Rinse BID  . atorvastatin  40 mg Oral q1800  . carvedilol  25 mg Oral BID  WC  . cloNIDine  0.2 mg Transdermal Weekly  . heparin  5,000 Units Subcutaneous 3 times per day  . hydrALAZINE  100 mg Oral TID  . insulin aspart  0-20 Units Subcutaneous TID WC  . insulin glargine  13 Units Subcutaneous Daily  . ipratropium-albuterol  3 mL Nebulization QID  . levothyroxine  50 mcg Oral QAC breakfast  . nitroGLYCERIN  1 inch Topical 3 times per day  . sodium chloride  3 mL Intravenous Q12H    Time spent on care of this patient: 40 mins   Drema Dallas , MD   [facility] Office  [phone] Pager [phone]  On-Call/Text Page:      [email or web address]      password [email or web address]  If 7PM-7AM, please contact night-coverage [email or web address] Password [email or web address] 08/04/2014, 10:56 AM   LOS: 11 days

## 2014-08-04 NOTE — Progress Notes (Signed)
Spoke with patient son, Mauri PoleLessley McNeil, by phone regarding his long term plans for his mother.  He indicated that the plan is for her to stay at a SNF  long term.  Will confirm that the patient has long term care medicaid in place or pending for this need.  Of note, [facility] Care Management services does not replace or interfere with any services that are arranged by inpatient case management or social work.  For additional questions or referrals please contact Anibal Hendersonim Henderson BSN RN [facility] Liaison at [phone].

## 2014-08-04 NOTE — Progress Notes (Signed)
Physical Therapy Re-evaluation Patient Details Name: [name] [name] MRN: [id number] DOB: [birth year] Today's Date: 08/04/2014    History of Present Illness 73 yo F with prior h/o hypertension, DM, CVA, and stage 2 CKD, who was brought in by EMS for altered mental status, found to be acidotic upon admission.    PT Comments    Pt continues to require +2 total assist for mobility, and at time of PT re-eval pt was nonverbal and unable to follow commands. She did demonstrate spontaneous movement of all extremities, and appeared to be resisting therapist during passive movement. Goals have been downgraded and pt is on a trial for PT. Will continue to follow and progress as able per POC.    Follow Up Recommendations  SNF;Supervision/Assistance - 24 hour     Equipment Recommendations  Rolling walker with 5" wheels;3in1 (PT);Wheelchair (measurements PT);Wheelchair cushion (measurements PT);Hospital bed    Recommendations for Other Services       Precautions / Restrictions Precautions Precautions: Fall Restrictions Weight Bearing Restrictions: No    Mobility  Bed Mobility Overal bed mobility: Needs Assistance;+2 for physical assistance Bed Mobility: Rolling Rolling: +2 for physical assistance;Max assist         General bed mobility comments: Therapist flexed one LE and initiated roll to each side. Total assist +2 for rolling and positioning, and max assist to maintain S/L position. Therapist placed pt's hand on bed rail and she was able to grip and use bed rail for support.   Transfers                 General transfer comment: Unable to attempt  Ambulation/Gait                 Stairs            Wheelchair Mobility    Modified Rankin (Stroke Patients Only)       Balance                                    Cognition Arousal/Alertness: Lethargic Behavior During Therapy: Flat affect Overall Cognitive Status: Impaired/Different from  baseline Area of Impairment: Orientation;Following commands;Safety/judgement;Awareness;Problem solving               General Comments: Pt unable to speak during session and does not follow commands. Noted spontaneous movement with extremities and pt with appropriate blink reaction to threat.     Exercises      General Comments General comments (skin integrity, edema, etc.): Pt left propped onto L side with pillows behind back and behind neck for support and encouragement to look left. Some L neglect noted in head turn and gaze.      Pertinent Vitals/Pain Pain Assessment: No/denies pain    Home Living Family/patient expects to be discharged to:: Skilled nursing facility Living Arrangements: Spouse/significant other;Children Available Help at Discharge: Available 24 hours/day;Family Type of Home: House Home Access: Ramped entrance   Home Layout: One level Home Equipment: Walker - 2 wheels;Tub bench;Bedside commode Additional Comments: per RN, family is not able to manage pt at home. Previous information taken from previous admission. Pt was unable to state any history and no family members present.     Prior Function Level of Independence: Needs assistance  Gait / Transfers Assistance Needed: Assist to transfer and for ambulation with RW ADL's / Homemaking Assistance Needed: Assist for bathing/dressing, housekeeping, and meal prep.  Comments: Patient legally  blind   PT Goals (current goals can now be found in the care plan section) Acute Rehab PT Goals Patient Stated Goal: unable PT Goal Formulation: Patient unable to participate in goal setting Time For Goal Achievement: 08/18/14 Potential to Achieve Goals: Fair Progress towards PT goals: Goals downgraded-see care plan    Frequency  Min 3X/week    PT Plan Current plan remains appropriate    Co-evaluation             End of Session   Activity Tolerance: Patient limited by lethargy Patient left: in bed;with  call bell/phone within reach;with bed alarm set;with nursing/sitter in room     Time: 1413-1430 PT Time Calculation (min): 17 min  Charges:  $Therapeutic Activity: 8-22 mins                    G Codes:      Ruthann CancerHamilton, Tsion Inghram 08/04/2014, 3:46 PM  Ruthann CancerLaura Hamilton, PT, DPT Acute Rehabilitation Services Pager: [phone]

## 2014-08-04 NOTE — Progress Notes (Addendum)
Clinical Social Work Department CLINICAL SOCIAL WORK PLACEMENT NOTE 08/04/2014  Patient:  [name] FullingVANS,[name] [name]  Account Number:  [id number] Admit date:  07/24/2014  Clinical Social Worker:  Sharol HarnessPOONUM Taos Tapp, Theresia MajorsLCSWA  Date/time:  08/04/2014 11:30 AM  Clinical Social Work is seeking post-discharge placement for this patient at the following level of care:   SKILLED NURSING   (*CSW will update this form in Epic as items are completed)   08/04/2014  Patient/family provided with [facility] Department of Clinical Social Work's list of facilities offering this level of care within the geographic area requested by the patient (or if unable, by the patient's family).  08/04/2014  Patient/family informed of their freedom to choose among providers that offer the needed level of care, that participate in Medicare, Medicaid or managed care program needed by the patient, have an available bed and are willing to accept the patient.  08/04/2014  Patient/family informed of [facility]' ownership interest in [facility], as well as of the fact that they are under no obligation to receive care at this facility.  PASARR submitted to EDS on  PASARR number received on   FL2 transmitted to all facilities in geographic area requested by pt/family on  08/04/2014 FL2 transmitted to all facilities within larger geographic area on   Patient informed that his/her managed care company has contracts with or will negotiate with  certain facilities, including the following:   Patient/family informed of bed offers received:  08/04/2014 [facility] Physician recommends and patient chooses bed at    Patient [facility] be [facility]  Patient to be transferred to facility by [facility] Patient and family notified of transfer on 08/05/2014 Name of family member notified:  Mauri PoleLessley McNeil (son)  The following physician request were entered in Epic: Physician Request  Please sign  FL2.    Additional Comments: Existing Merrilyn PumaSARR   Jereme Loren, LCSWA [phone]

## 2014-08-05 LAB — GLUCOSE, CAPILLARY
GLUCOSE-CAPILLARY: 114 mg/dL — AB (ref 70–99)
Glucose-Capillary: 91 mg/dL (ref 70–99)

## 2014-08-05 MED ORDER — ISOSORBIDE DINITRATE 5 MG PO TABS: 10.0000 mg | ORAL_TABLET | Freq: Three times a day (TID) | ORAL | Status: DC

## 2014-08-05 MED ORDER — HYDRALAZINE HCL 100 MG PO TABS: 100.0000 mg | ORAL_TABLET | Freq: Three times a day (TID) | ORAL | Status: DC

## 2014-08-05 MED ORDER — POTASSIUM CHLORIDE CRYS ER 20 MEQ PO TBCR
40.0000 meq | EXTENDED_RELEASE_TABLET | Freq: Once | ORAL | Status: AC
  Administered 2014-08-05: 40 meq via ORAL
  Filled 2014-08-05: qty 2

## 2014-08-05 MED ORDER — INSULIN GLARGINE 100 UNIT/ML ~~LOC~~ SOLN: 13.0000 [IU] | Freq: Every day | SUBCUTANEOUS | Status: DC

## 2014-08-05 MED ORDER — CLONIDINE HCL 0.1 MG PO TABS: 0.1000 mg | ORAL_TABLET | Freq: Three times a day (TID) | ORAL | Status: DC

## 2014-08-05 MED ORDER — INSULIN ASPART 100 UNIT/ML ~~LOC~~ SOLN: 0.0000 [IU] | Freq: Three times a day (TID) | SUBCUTANEOUS | Status: DC

## 2014-08-05 MED ORDER — ACETAMINOPHEN 325 MG PO TABS: 650.0000 mg | ORAL_TABLET | Freq: Four times a day (QID) | ORAL | Status: DC | PRN

## 2014-08-05 MED ORDER — ALBUTEROL SULFATE (2.5 MG/3ML) 0.083% IN NEBU: 2.5000 mg | INHALATION_SOLUTION | RESPIRATORY_TRACT | Status: DC | PRN

## 2014-08-05 NOTE — Progress Notes (Signed)
Speech Language Pathology Treatment: Dysphagia  Patient Details Name: [name] FullingBetty J [name] MRN: [id number] DOB: Apr 02, 1941 Today's Date: 08/05/2014 Time: [id number] SLP Time Calculation (min): 11 min  Assessment / Plan / Recommendation Clinical Impression  Pt. Alert, open mouth breathing.  Hand over hand with elbow support for feeding with pt. able to activate approximately 5-10% to assist with feeding.  Suspect delayed swallow initiation and multiple swallows (however, no significant residue during MBS).  No indications poor airway protection.  Suspect she will likely require altered diet of Dys 1 and nectar thick liquids for longer term.  Recommend she continue current diet/liquids, ST will sign off and recommend ST once discharged to SNF.   HPI HPI: [name] with COPD, CVA, CK D., Diabetes admitted 9/18 with acute respiratory distress , hypercarbic respiratory failure, DKA with glucose of 802 and anion gap of 19 , requiring mechanical ventilation. Chest x-ray showed left upper lobe infiltrate. Was placed on BiPAP initially, but unresponsive on arrival to the ICU, hence intubated. She had recent admit from 8/6 to 814 for hypercarbic failure that improved with BiPAP-dc'd to NH , then home since 9/14. 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 Patient has been seen by SLP during previous admissions and has been observed with a delayed oral phase and suspected delayed swallow initiation with s/s of aspiration with thin liquids via straw. Most frequent diet recommendations for dysphagia 1 solids, thin liquids.    Pertinent Vitals Pain Assessment: No/denies pain  SLP Plan  Discharge SLP treatment due to (comment) (pt. may need altered diet/liquids longer term)    Recommendations Diet recommendations: Dysphagia 1 (puree);Nectar-thick liquid Liquids provided via: Cup;No straw Medication Administration: Crushed with puree Supervision: Staff to assist with self feeding;Full  supervision/cueing for compensatory strategies Compensations: Slow rate;Small sips/bites;Check for pocketing Postural Changes and/or Swallow Maneuvers: Seated upright 90 degrees;Upright 30-60 min after meal              Oral Care Recommendations: Oral care BID Follow up Recommendations: Skilled Nursing facility Plan: Discharge SLP treatment due to (comment) (pt. may need altered diet/liquids longer term)    GO     Royce MacadamiaLitaker, Danielle Lento Willis 08/05/2014, 10:21 AM  Breck CoonsLisa Willis Lonell FaceLitaker M.Ed [employer]P Pager [phone]

## 2014-08-05 NOTE — Progress Notes (Signed)
CSW (Clinical Social Worker) prepared pt dc packet and placed with shadow chart. CSW arranged non-emergent ambulance transport. Pt, pt family, pt nurse, and facility informed. CSW signing off.  Lynita Groseclose, LCSWA [phone]  

## 2014-08-05 NOTE — Discharge Summary (Signed)
DISCHARGE SUMMARY  [name] [name]  MR#: [id number]  DOB:[birth year]  Date of Admission: 07/24/2014 Date of Discharge: 08/05/2014  Attending Physician:MCCLUNG,JEFFREY T  Patient's [name] [name],[name] R., MD  Consults:  PCCM > TRH  Disposition: D/C to SNF   Follow-up Appts:     Follow-up Information   Follow up with Attending of record at your chosen SNF. (The Attending of record at your chosen SNF will assume your care.  )       Tests Needing Follow-up: - close monitoring of BP and further titration of medications will be indicated - a recheck BMET in 3-4 days is suggested to evaluation renal function and K+ - aldosterone + renin activity pending - pt requires a modified diet as specified below, and will need careful assistance w/ each meal to assure that her intake is maximized - pt will need ongoing PT/OT to maximize her mobility/rehabilitation  - consideration should be given to arranging an outpt sleep study to evaluate for possible sleep apnea   Discharge Diagnoses: Acute encephalopathy  Acute hypercarbic respiratory failure - suspected OHS/SA  LUL HCAP, allergic to penicillin  Aspiration Hypertension uncontrolled  ACUTE ON CHRONIC KIDNEY FAILURE stage III  Hypernatremia  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 Pt was in her usual state of health until the AM of admission when she was noted to have increased respiratory effort. Pt normally uses "puffers" but she ran out and no longer had any at home. Pt was brought to the ED where a CXR was notable for a LUL infiltrate. Also, pt was noted to have glucose over 800. Abg was notable for pH of 7.25 with PCO2 of 72. Pt was placed on BiPAP with repeat pH of 7.29 and PCO2 of  63.   Hospital Course:  Significant Events:  9/18 head CT - no mass shift or bleed - old right basal ganglia / bilateral thalamic lacunar infarct  9/18 intubated  9/19 off insulin gtt  9/21 extubated  9/22 re-intubated for poor mental status  9/23 extubated 2nd time   Below is a list of the active problems treated during this hospitalization:  Acute encephalopathy  multifactorial: hypercarbia, infection, and sedating medications, w/ possible component of sundowning - per PCCM son says walked with help and conversed after stroke, but needed 24 hour care in SNF and failed at home - PT/OT recommend SNF and son has agreed - mental stats continues to wax and wane - acute CT head 9/29 w/ no new findings - ABG on 9/28 during sedate spell unrevealing   Acute hypercarbic respiratory failure - suspected OHS/SA  Resolved - consider arranging outpt sleep study   LUL HCAP, allergic to penicillin  Completed 7 day course of antibiotics - no evidence of persisting infection - on RA at time of d/c   Aspiration risk  Patient passed modified barium swallow on 9/26 - dysphagia 1 diet with nectar thick liquid when awake   Hypertension uncontrolled  multiple medication adjustments were required - likely an element of agitation playing a role - renovascular ultrasound abnormal but not consistent with stenosis - PTH elevated however calcium low not consistent with hyperparathyroidism - aldosterone + renin activity pending - ongoing titration of BP meds at the SNF will be  indicated   ACUTE ON CHRONIC KIDNEY FAILURE stage III  baseline Cr 1.06-1.20 - creatinine 1.33 at time of d/c - avoid nephrotoxins   Hypernatremia  Resolved   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 - DKA versus HHNS  DKA/HHNS resolved - CBG currently well controlled   Hypothyroidism  continue Synthroid 50 mcg - TSH/FREE T4 WNL   Hypokalemia  Recheck in 3-4 days - Mg 2.0 - likely due to poor intake  Obesity - Body  mass index is 30.31 kg/(m^2)    Medication List    STOP taking these medications       chlorhexidine 0.12 % solution  Commonly known as:  PERIDEX     furosemide 20 MG tablet  Commonly known as:  LASIX     gabapentin 300 MG capsule  Commonly known as:  NEURONTIN     insulin NPH-regular Human (70-30) 100 UNIT/ML injection  Commonly known as:  HUMULIN 70/30     pantoprazole 40 MG tablet  Commonly known as:  PROTONIX     predniSONE 5 MG tablet  Commonly known as:  DELTASONE      TAKE these medications       acetaminophen 325 MG tablet  Commonly known as:  TYLENOL  Take 2 tablets (650 mg total) by mouth every 6 (six) hours as needed for mild pain (or Fever >/= 101).     albuterol (2.5 MG/3ML) 0.083% nebulizer solution  Commonly known as:  PROVENTIL  Take 3 mLs (2.5 mg total) by nebulization every 2 (two) hours as needed for shortness of breath.     atorvastatin 40 MG tablet  Commonly known as:  LIPITOR  Take 40 mg by mouth daily.     budesonide-formoterol 80-4.5 MCG/ACT inhaler  Commonly known as:  SYMBICORT  Inhale 2 puffs into the lungs 2 (two) times daily.     carvedilol 25 MG tablet  Commonly known as:  COREG  Take 25 mg by mouth 2 (two) times daily with a meal.     cloNIDine 0.1 MG tablet  Commonly known as:  CATAPRES  Take 1 tablet (0.1 mg total) by mouth 3 (three) times daily.     clopidogrel 75 MG tablet  Commonly known as:  PLAVIX  Take 1 tablet (75 mg total) by mouth daily with breakfast.     hydrALAZINE 100 MG tablet  Commonly known as:  APRESOLINE  Take 1 tablet (100 mg total) by mouth 3 (three) times daily.     insulin aspart 100 UNIT/ML injection  Commonly known as:  novoLOG  Inject 0-20 Units into the skin 3 (three) times daily with meals.     insulin glargine 100 UNIT/ML injection  Commonly known as:  LANTUS  Inject 0.13 mLs (13 Units total) into the skin daily.     isosorbide dinitrate 5 MG tablet  Commonly known as:  ISORDIL TITRADOSE    Take 2 tablets (10 mg total) by mouth 3 (three) times daily.     levothyroxine 50 MCG tablet  Commonly known as:  SYNTHROID, LEVOTHROID  Take 1 tablet (50 mcg total) by mouth daily.       Day of Discharge BP 195/62  Pulse 79  Temp(Src) 97.6 F (36.4 C) (Axillary)  Resp 26  Ht 5\' 3"  (1.6 m)  Wt 77.6 kg (171 lb 1.2 oz)  BMI 30.31 kg/m2  SpO2 94%  Physical Exam: General: No acute respiratory distress - pt does not speak initially, but w/ loud voice and tactile  stimulus pt awakens and is oriented to person and place  Lungs: Clear to auscultation bilaterally without wheezes or crackles Cardiovascular: Regular rate and rhythm without murmur gallop or rub normal S1 and S2 Abdomen: Nontender, nondistended, soft, bowel sounds positive, no rebound, no ascites, no appreciable mass Extremities: No significant cyanosis, clubbing, or edema bilateral lower extremities  Basic Metabolic Panel:  Recent Labs Lab 07/30/14 0430  07/30/14 1200 07/31/14 0400 08/02/14 0300 08/02/14 1930 08/03/14 0858 08/03/14 1010 08/04/14 0330  NA  --   --  149* 148* 139  --  143  --  144  K  --   --  3.7 3.6* 3.3*  --  3.1*  --  3.4*  CL  --   --  108 106 99  --  104  --  106  CO2  --   --  30 28 26   --  29  --  24  GLUCOSE  --   --  166* 84 220*  --  163*  --  92  BUN  --   --  37* 31* 19  --  17  --  17  CREATININE  --   --  1.30* 1.27* 1.28*  --  1.33*  --  1.33*  CALCIUM  --   < > 8.5 8.6 8.2* 8.5 8.1*  --  8.1*  MG 2.0  --   --  2.0  --   --   --  2.0  --   < > = values in this interval not displayed.  Liver Function Tests:  Recent Labs Lab 08/04/14 0330  AST 40*  ALT 29  ALKPHOS 71  BILITOT <0.2*  PROT 5.8*  ALBUMIN 2.3*    Recent Labs Lab 08/03/14 1010  AMMONIA 21    CBC:  Recent Labs Lab 08/02/14 0300 08/04/14 0330  WBC 7.3 6.0  HGB 10.5* 9.6*  HCT 31.1* 29.1*  MCV 87.6 88.7  PLT 261 300   CBG:  Recent Labs Lab 08/04/14 0727 08/04/14 1201 08/04/14 1716  08/04/14 2229 08/05/14 0750  GLUCAP 102* 155* 128* 102* 91    Time spent in discharge (includes decision making & examination of pt): >30 minutes  08/05/2014, 11:40 AM   Lonia Blood, MD [facility] Office  [phone] Pager [phone]  On-Call/Text Page:      [email or web address]      password [facility]

## 2014-08-06 ENCOUNTER — Emergency Department (HOSPITAL_COMMUNITY): Payer: Medicare HMO

## 2014-08-06 ENCOUNTER — Non-Acute Institutional Stay (SKILLED_NURSING_FACILITY): Payer: Commercial Managed Care - HMO | Admitting: Internal Medicine

## 2014-08-06 ENCOUNTER — Inpatient Hospital Stay (HOSPITAL_COMMUNITY)
Admission: EM | Admit: 2014-08-06 | Discharge: 2014-08-20 | DRG: 065 | Disposition: A | Discharge status: Skilled Nursing Facility | Payer: Medicare HMO | Attending: Internal Medicine | Admitting: Internal Medicine

## 2014-08-06 ENCOUNTER — Encounter (HOSPITAL_COMMUNITY): Payer: Self-pay | Admitting: Radiology

## 2014-08-06 DIAGNOSIS — D649 Anemia, unspecified: Secondary | ICD-10-CM | POA: Diagnosis present

## 2014-08-06 DIAGNOSIS — G459 Transient cerebral ischemic attack, unspecified: Secondary | ICD-10-CM | POA: Diagnosis present

## 2014-08-06 DIAGNOSIS — I739 Peripheral vascular disease, unspecified: Secondary | ICD-10-CM | POA: Diagnosis present

## 2014-08-06 DIAGNOSIS — Z8673 Personal history of transient ischemic attack (TIA), and cerebral infarction without residual deficits: Secondary | ICD-10-CM

## 2014-08-06 DIAGNOSIS — R410 Disorientation, unspecified: Secondary | ICD-10-CM

## 2014-08-06 DIAGNOSIS — E1165 Type 2 diabetes mellitus with hyperglycemia: Secondary | ICD-10-CM | POA: Diagnosis present

## 2014-08-06 DIAGNOSIS — J9622 Acute and chronic respiratory failure with hypercapnia: Secondary | ICD-10-CM

## 2014-08-06 DIAGNOSIS — J449 Chronic obstructive pulmonary disease, unspecified: Secondary | ICD-10-CM | POA: Diagnosis present

## 2014-08-06 DIAGNOSIS — R1314 Dysphagia, pharyngoesophageal phase: Secondary | ICD-10-CM | POA: Diagnosis present

## 2014-08-06 DIAGNOSIS — I639 Cerebral infarction, unspecified: Secondary | ICD-10-CM | POA: Diagnosis present

## 2014-08-06 DIAGNOSIS — Z87891 Personal history of nicotine dependence: Secondary | ICD-10-CM

## 2014-08-06 DIAGNOSIS — J439 Emphysema, unspecified: Secondary | ICD-10-CM

## 2014-08-06 DIAGNOSIS — F05 Delirium due to known physiological condition: Secondary | ICD-10-CM

## 2014-08-06 DIAGNOSIS — Z4659 Encounter for fitting and adjustment of other gastrointestinal appliance and device: Secondary | ICD-10-CM

## 2014-08-06 DIAGNOSIS — N183 Chronic kidney disease, stage 3 unspecified: Secondary | ICD-10-CM | POA: Diagnosis present

## 2014-08-06 DIAGNOSIS — N189 Chronic kidney disease, unspecified: Secondary | ICD-10-CM

## 2014-08-06 DIAGNOSIS — Z66 Do not resuscitate: Secondary | ICD-10-CM | POA: Diagnosis present

## 2014-08-06 DIAGNOSIS — R4701 Aphasia: Secondary | ICD-10-CM | POA: Diagnosis present

## 2014-08-06 DIAGNOSIS — E1122 Type 2 diabetes mellitus with diabetic chronic kidney disease: Secondary | ICD-10-CM | POA: Diagnosis present

## 2014-08-06 DIAGNOSIS — E1121 Type 2 diabetes mellitus with diabetic nephropathy: Secondary | ICD-10-CM

## 2014-08-06 DIAGNOSIS — G9341 Metabolic encephalopathy: Secondary | ICD-10-CM

## 2014-08-06 DIAGNOSIS — I638 Other cerebral infarction: Secondary | ICD-10-CM | POA: Diagnosis not present

## 2014-08-06 DIAGNOSIS — E1129 Type 2 diabetes mellitus with other diabetic kidney complication: Secondary | ICD-10-CM

## 2014-08-06 DIAGNOSIS — M8580 Other specified disorders of bone density and structure, unspecified site: Secondary | ICD-10-CM | POA: Diagnosis present

## 2014-08-06 DIAGNOSIS — Z931 Gastrostomy status: Secondary | ICD-10-CM

## 2014-08-06 DIAGNOSIS — K219 Gastro-esophageal reflux disease without esophagitis: Secondary | ICD-10-CM

## 2014-08-06 DIAGNOSIS — Z833 Family history of diabetes mellitus: Secondary | ICD-10-CM

## 2014-08-06 DIAGNOSIS — IMO0002 Reserved for concepts with insufficient information to code with codable children: Secondary | ICD-10-CM

## 2014-08-06 DIAGNOSIS — Z7902 Long term (current) use of antithrombotics/antiplatelets: Secondary | ICD-10-CM

## 2014-08-06 DIAGNOSIS — Z794 Long term (current) use of insulin: Secondary | ICD-10-CM

## 2014-08-06 DIAGNOSIS — R29810 Facial weakness: Secondary | ICD-10-CM | POA: Diagnosis present

## 2014-08-06 DIAGNOSIS — J441 Chronic obstructive pulmonary disease with (acute) exacerbation: Secondary | ICD-10-CM

## 2014-08-06 DIAGNOSIS — Z8249 Family history of ischemic heart disease and other diseases of the circulatory system: Secondary | ICD-10-CM

## 2014-08-06 DIAGNOSIS — E785 Hyperlipidemia, unspecified: Secondary | ICD-10-CM

## 2014-08-06 DIAGNOSIS — E860 Dehydration: Secondary | ICD-10-CM

## 2014-08-06 DIAGNOSIS — Z7189 Other specified counseling: Secondary | ICD-10-CM

## 2014-08-06 DIAGNOSIS — E1140 Type 2 diabetes mellitus with diabetic neuropathy, unspecified: Secondary | ICD-10-CM | POA: Diagnosis present

## 2014-08-06 DIAGNOSIS — E870 Hyperosmolality and hypernatremia: Secondary | ICD-10-CM | POA: Diagnosis present

## 2014-08-06 DIAGNOSIS — I69354 Hemiplegia and hemiparesis following cerebral infarction affecting left non-dominant side: Secondary | ICD-10-CM

## 2014-08-06 DIAGNOSIS — E1142 Type 2 diabetes mellitus with diabetic polyneuropathy: Secondary | ICD-10-CM

## 2014-08-06 DIAGNOSIS — N179 Acute kidney failure, unspecified: Secondary | ICD-10-CM

## 2014-08-06 DIAGNOSIS — I129 Hypertensive chronic kidney disease with stage 1 through stage 4 chronic kidney disease, or unspecified chronic kidney disease: Secondary | ICD-10-CM | POA: Diagnosis present

## 2014-08-06 DIAGNOSIS — H548 Legal blindness, as defined in USA: Secondary | ICD-10-CM | POA: Diagnosis present

## 2014-08-06 DIAGNOSIS — E039 Hypothyroidism, unspecified: Secondary | ICD-10-CM

## 2014-08-06 DIAGNOSIS — Z515 Encounter for palliative care: Secondary | ICD-10-CM

## 2014-08-06 DIAGNOSIS — R1310 Dysphagia, unspecified: Secondary | ICD-10-CM

## 2014-08-06 DIAGNOSIS — I10 Essential (primary) hypertension: Secondary | ICD-10-CM

## 2014-08-06 DIAGNOSIS — G9340 Encephalopathy, unspecified: Secondary | ICD-10-CM

## 2014-08-06 LAB — I-STAT ARTERIAL BLOOD GAS, ED
Acid-Base Excess: 4 mmol/L — ABNORMAL HIGH (ref 0.0–2.0)
Bicarbonate: 28.7 mEq/L — ABNORMAL HIGH (ref 20.0–24.0)
O2 Saturation: 92 %
PH ART: 7.419 (ref 7.350–7.450)
Patient temperature: 98.8
TCO2: 30 mmol/L (ref 0–100)
pCO2 arterial: 44.5 mmHg (ref 35.0–45.0)
pO2, Arterial: 65 mmHg — ABNORMAL LOW (ref 80.0–100.0)

## 2014-08-06 LAB — URINE MICROSCOPIC-ADD ON

## 2014-08-06 LAB — COMPREHENSIVE METABOLIC PANEL
ALT: 16 U/L (ref 0–35)
AST: 21 U/L (ref 0–37)
Albumin: 2.3 g/dL — ABNORMAL LOW (ref 3.5–5.2)
Alkaline Phosphatase: 67 U/L (ref 39–117)
Anion gap: 10 (ref 5–15)
BUN: 11 mg/dL (ref 6–23)
CHLORIDE: 107 meq/L (ref 96–112)
CO2: 27 meq/L (ref 19–32)
CREATININE: 1.27 mg/dL — AB (ref 0.50–1.10)
Calcium: 8.3 mg/dL — ABNORMAL LOW (ref 8.4–10.5)
GFR calc Af Amer: 47 mL/min — ABNORMAL LOW (ref >90)
GFR, EST NON AFRICAN AMERICAN: 40 mL/min — AB (ref >90)
Glucose, Bld: 181 mg/dL — ABNORMAL HIGH (ref 70–99)
Potassium: 4 mEq/L (ref 3.7–5.3)
Sodium: 144 mEq/L (ref 137–147)
Total Bilirubin: 0.2 mg/dL — ABNORMAL LOW (ref 0.3–1.2)
Total Protein: 5.9 g/dL — ABNORMAL LOW (ref 6.0–8.3)

## 2014-08-06 LAB — DIFFERENTIAL
BASOS ABS: 0 10*3/uL (ref 0.0–0.1)
Basophils Relative: 0 % (ref 0–1)
Eosinophils Absolute: 0 10*3/uL (ref 0.0–0.7)
Eosinophils Relative: 0 % (ref 0–5)
Lymphocytes Relative: 16 % (ref 12–46)
Lymphs Abs: 1.5 10*3/uL (ref 0.7–4.0)
MONO ABS: 0.9 10*3/uL (ref 0.1–1.0)
MONOS PCT: 10 % (ref 3–12)
NEUTROS ABS: 7 10*3/uL (ref 1.7–7.7)
Neutrophils Relative %: 74 % (ref 43–77)

## 2014-08-06 LAB — CBC
HEMATOCRIT: 28 % — AB (ref 36.0–46.0)
HEMOGLOBIN: 9.1 g/dL — AB (ref 12.0–15.0)
MCH: 29.4 pg (ref 26.0–34.0)
MCHC: 32.5 g/dL (ref 30.0–36.0)
MCV: 90.6 fL (ref 78.0–100.0)
Platelets: 290 10*3/uL (ref 150–400)
RBC: 3.09 MIL/uL — ABNORMAL LOW (ref 3.87–5.11)
RDW: 13.8 % (ref 11.5–15.5)
WBC: 9.4 10*3/uL (ref 4.0–10.5)

## 2014-08-06 LAB — I-STAT CHEM 8, ED
BUN: 9 mg/dL (ref 6–23)
CALCIUM ION: 1.12 mmol/L — AB (ref 1.13–1.30)
CREATININE: 1.2 mg/dL — AB (ref 0.50–1.10)
Chloride: 110 mEq/L (ref 96–112)
Glucose, Bld: 177 mg/dL — ABNORMAL HIGH (ref 70–99)
HCT: 27 % — ABNORMAL LOW (ref 36.0–46.0)
Hemoglobin: 9.2 g/dL — ABNORMAL LOW (ref 12.0–15.0)
Potassium: 3.9 mEq/L (ref 3.7–5.3)
Sodium: 144 mEq/L (ref 137–147)
TCO2: 25 mmol/L (ref 0–100)

## 2014-08-06 LAB — URINALYSIS, ROUTINE W REFLEX MICROSCOPIC
Bilirubin Urine: NEGATIVE
Glucose, UA: NEGATIVE mg/dL
Hgb urine dipstick: NEGATIVE
Ketones, ur: NEGATIVE mg/dL
NITRITE: NEGATIVE
Protein, ur: 100 mg/dL — AB
SPECIFIC GRAVITY, URINE: 1.017 (ref 1.005–1.030)
UROBILINOGEN UA: 0.2 mg/dL (ref 0.0–1.0)
pH: 5 (ref 5.0–8.0)

## 2014-08-06 LAB — ETHANOL: Alcohol, Ethyl (B): <11 mg/dL (ref 0–11)

## 2014-08-06 LAB — PROTIME-INR
INR: 1.11 (ref 0.00–1.49)
Prothrombin Time: 14.3 seconds (ref 11.6–15.2)

## 2014-08-06 LAB — I-STAT TROPONIN, ED: Troponin i, poc: 0.06 ng/mL (ref 0.00–0.08)

## 2014-08-06 LAB — CBG MONITORING, ED: GLUCOSE-CAPILLARY: 158 mg/dL — AB (ref 70–99)

## 2014-08-06 LAB — APTT: aPTT: 23 seconds — ABNORMAL LOW (ref 24–37)

## 2014-08-06 NOTE — ED Notes (Signed)
Code stroke canceled at this time by neuro and EDP

## 2014-08-06 NOTE — Consult Note (Signed)
Referring Physician: Blinda LeatherwoodPOLLINA, C    Chief Complaint: Acute onset inability to speak.  HPI: Erin FullingBetty J Viele is an 73 y.o. female history of previous strokes, diabetes mellitus, hypertension and hyperlipidemia as well as chronic kidney disease and [facility], having been admitted for acute encephalopathy with hypercarbia and respiratory failure requiring intubation, uncontrolled hypertension and uncontrolled diabetes mellitus. She experienced sudden onset of inability to speak at 5:30 PM today. No focal weakness was noted. She had no breathing difficulty. There was no change in consciousness. Patient arrived in the emergency room and code stroke status. CT scan of her head showed no acute intracranial abnormality. She does be on time window for treatment intervention with TPA, a.m. she had begun to speak after arriving in the emergency room.  LSN: 5:30 PM on 08/06/2014 tPA Given: No: Beyond time window for treatment consideration mRankin:  Past Medical History  Diagnosis Date  . Hypertension   . Diabetes mellitus   . Chronic renal insufficiency   . Hyperlipidemia   . Osteopenia   . CVA (cerebral infarction)   . Thyroid disease     Family History  Problem Relation Age of Onset  . Diabetes Mother   . Hypertension Mother      Medications: I have reviewed the patient's current medications.  ROS: History obtained from unobtainable from patient due to patient's speech output difficulty   Physical Examination: Blood pressure 159/101, pulse 75, temperature 98.8 F (37.1 C), temperature source Oral, resp. rate 27, weight 77.111 kg (170 lb), SpO2 99.00%.  Neurologic Examination: Mental Status: Alert, and slightly short of breath. He was able to speak intermittently with your expressive aphasia noted. She was also able to follow simple verbal commands. Cranial Nerves: II- difficult to assess with poor speech output and no clear responses to visual  threat. III/IV/VI-Pupils were equal and reacted. Extraocular movements were full and conjugate.    V/VII-no facial numbness and no facial weakness. VIII-normal. X- reduced speech output with moderate dysarthria. Motor:  no focal weakness noted. Muscle tone was normal throughout. Sensory: Normal throughout. Deep Tendon Reflexes: 2+ and symmetric. Plantars:  mute bilaterally Cerebellar:  unable to adequately assess .  Ct Head Wo Contrast  08/06/2014   CLINICAL DATA:  Code stroke.  Facial droop.  EXAM: CT HEAD WITHOUT CONTRAST  TECHNIQUE: Contiguous axial images were obtained from the base of the skull through the vertex without intravenous contrast.  COMPARISON:  08/04/2014  FINDINGS: Diffuse cerebral atrophy. Ventricular dilatation consistent with central atrophy. Low-attenuation changes throughout the deep white matter consistent with small vessel ischemia. Old lacunar infarcts in the right caudate lobe, left thalamus, and left cerebellum. No change since previous study. No mass effect or midline shift. No abnormal extra-axial fluid collections. Gray-white matter junctions are distinct. Basal cisterns are not effaced. No evidence of acute intracranial hemorrhage. No depressed skull fractures. Small retention cyst in the sphenoid sinus. Mastoid air cells are not opacified.  IMPRESSION: No acute intracranial abnormalities. Chronic atrophy and small vessel ischemic changes with multiple old lacunar infarcts.  These results were called by telephone at the time of interpretation on 08/06/2014 at 10:52 pm to Dr. Vanetta MuldersSCOTT ZACKOWSKI , who verbally acknowledged these results.   Electronically Signed   By: Burman NievesWilliam  Stevens M.D.   On: 08/06/2014 22:54    Assessment: 73 y.o. female with previous strokes and significant risk factors for stroke presenting with probable TIA or possible recurrent left subcortical MCA territory stroke.  Stroke Risk Factors - diabetes  mellitus, hyperlipidemia and  hypertension  Plan: 1. HgbA1c, fasting lipid panel 2. MRI, MRA  of the brain without contrast 3. PT consult, OT consult, Speech consult 4. Prophylactic therapy-Antiplatelet med: Plavix  5. Risk factor modification 6. Telemetry monitoring   C.R. Roseanne Reno, MD [facility] [phone]   08/06/2014, 11:19 PM

## 2014-08-06 NOTE — ED Notes (Signed)
Pt arrives from [facility], per EMS pt was refusing medications and dinner at 1730. Pt was non verbal, expressive aphasia. Follows commands, pupils are pinpoint with no hx of narcotic use. CBG 242. Previous stroke hx.

## 2014-08-06 NOTE — Progress Notes (Signed)
Patient ID: [name] [name], female   DOB: 04/18/1941, 73 y.o.   MRN: [id number]  Facility; [facility] Chief complaint; admission to SNF post admit to [facility] from 9/18 to 9/30  History; this is a patient who is readmitted to the facility who was just discharged from this facility 2 weeks ago. At that point the patient was able to ambulate the bathroom independently was conversational. Previous admissions here in the summer were secondary to a nondominant hemisphere CVA. She was readmitted to hospital in the middle of August with acute hypercarbic respiratory failure, respiratory acidosis. She required BiPAP. She spent a month in this building and was discharged home 2 weeks ago in fairly good physical condition according to the nurse who was on the floor. I have not previously been involved with this woman. On this occasion once again she was admitted from home with respiratory failure. Her pH was 7.25 PCO2 of 72 she was placed on BiPAP and ultimately placed on the ventilator. She was extubated by 9/21 she was reintubated on 9/22 and then re\re extubated on 9/23. Her hypercarbic respiratory failure was felt to be multifactorial including baseline hypercarbia from obesity hypoventilation syndrome possibly, left upper lobe pneumonia, sedating medications. She continued to have fluctuating mental status however recent CT scan of the head was negative for an acute injury, blood gases were satisfactory. She had an EEG in August that was negative for seizures. Her prerenal azotemia and hypernatremia were corrected.  Once again things have not been stable since her arrival in this SNF last night. She was found to be hypoxic and was placed on oxygen at 3 L. When I arrived in the room this morning to see her her O2 sat was 99%, her eyes were open but she did not respond. Somebody he tried to feed her breakfast and was sitting on swallowed in her mouth. When I tried to engage her in conversation she did not look  at me or respond.      CLINICAL DATA:  Facial droop and left upper extremity paresis.   EXAM: CT HEAD WITHOUT CONTRAST   TECHNIQUE: Contiguous axial images were obtained from the base of the skull through the vertex without intravenous contrast.   COMPARISON:  CT scan of July 24, 2014.   FINDINGS: Bony calvarium appears intact. Mild diffuse cortical atrophy is noted. Moderate chronic ischemic white matter disease is noted. Stable calcifications are noted in the basal ganglia bilaterally. No mass effect or midline shift is noted. Ventricular size is within normal limits. There is no evidence of mass lesion, hemorrhage or acute infarction.   IMPRESSION: Mild diffuse cortical atrophy. Moderate chronic ischemic white matter disease. No acute intracranial abnormality seen.     Electronically Signed   By: Roque Lias M.D.   On: 08/04/2014 14:02          Past Medical History  Diagnosis Date  . Hypertension   . Diabetes mellitus   . Chronic renal insufficiency   . Hyperlipidemia   . Osteopenia   . CVA (cerebral infarction)   . Thyroid disease    Past Surgical History  Procedure Laterality Date  . Eye surgery    . Loop recorder implant  03-24-2014    MDT LinQ implanted by Dr Johney Frame for cryptogenic stroke  . Tee without cardioversion N/A 03/23/2014    Procedure: TRANSESOPHAGEAL ECHOCARDIOGRAM (TEE);  Surgeon: Lars Masson, MD;  Location: [facility] ENDOSCOPY;  Service: Cardiovascular;  Laterality: N/A;   Current Outpatient Prescriptions  on File Prior to Visit  Medication Sig Dispense Refill  . acetaminophen (TYLENOL) 325 MG tablet Take 2 tablets (650 mg total) by mouth every 6 (six) hours as needed for mild pain (or Fever >/= 101).      Marland Kitchen albuterol (PROVENTIL) (2.5 MG/3ML) 0.083% nebulizer solution Take 3 mLs (2.5 mg total) by nebulization every 2 (two) hours as needed for shortness of breath.  75 mL  12  . atorvastatin (LIPITOR) 40 MG tablet Take 40 mg by mouth daily.       . budesonide-formoterol (SYMBICORT) 80-4.5 MCG/ACT inhaler Inhale 2 puffs into the lungs 2 (two) times daily.  1 Inhaler  0  . carvedilol (COREG) 25 MG tablet Take 25 mg by mouth 2 (two) times daily with a meal.      . cloNIDine (CATAPRES) 0.1 MG tablet Take 1 tablet (0.1 mg total) by mouth 3 (three) times daily.  60 tablet  11  . clopidogrel (PLAVIX) 75 MG tablet Take 1 tablet (75 mg total) by mouth daily with breakfast.  30 tablet  0  . hydrALAZINE (APRESOLINE) 100 MG tablet Take 1 tablet (100 mg total) by mouth 3 (three) times daily.      . insulin aspart (NOVOLOG) 100 UNIT/ML injection Inject 0-20 Units into the skin 3 (three) times daily with meals.  10 mL  11  . insulin glargine (LANTUS) 100 UNIT/ML injection Inject 0.13 mLs (13 Units total) into the skin daily.  10 mL  11  . isosorbide dinitrate (ISORDIL TITRADOSE) 5 MG tablet Take 2 tablets (10 mg total) by mouth 3 (three) times daily.      Marland Kitchen levothyroxine (SYNTHROID, LEVOTHROID) 50 MCG tablet Take 1 tablet (50 mcg total) by mouth daily.  30 tablet  0    Social; bleed she lives at home with her son. According to a nurse who is present at this morning she was able to walk to the bathroom and was able to converse when she left this building 2 weeks ago. She has no advanced directives on her current nursing home chart  reports that she quit smoking about 8 weeks ago. Her smoking use included Cigarettes. She has a 30 pack-year smoking history. She has never used smokeless tobacco. She reports that she drinks alcohol. She reports that she does not use illicit drugs.  family history includes Diabetes in her mother; Hypertension in her mother.  Review of systems; not really possible and the patient's current condition  Physical exam Gen. as stated above the patient's eyes were open however she did not respond to my voice or command. There was on swallowed food in her mouth which I cleaned out manually Vital signs; O2 sat was initially 99%  on 3 L pulse rate 72 respirations 24 blood pressure 99/60. Temperature 97.6 blood sugar is 109 Respiratory very shallow air entry bilaterally coarse crackles in the right lower greater than left lower lobes prolonged expiratory phase mild expiratory wheeze. No accessory muscle use work of breathing did not appear to be excessive Cardiac heart sounds are normal no gallops no evidence of dehydration Abdomen; bowel sounds are positive no liver no spleen no masses. There was tenderness to deep palpation in the right lower cauldron but no overt guarding or rebound Neurologic; not easy to examine her as she really is not cooperative for. She appears to have flattening of her right nasal labial fold. Left arm is much weaker than the right which appears to be normal. There is also left leg  weakness vs. the right although this is better than her left arm. I am assuming this is her original deficit.  Mental status; she is awake but not responsive to command. His originally stated she couldn't even swallow her food.  Impression/plan #1 altered mental status/delirium/encephalopathy; apparently she had a waxing and waning mental status in the hospital as well. CT scan of the head on 929 showed no new findings [see above] ABG during sedate spell was unrevealing on 9/28. She does not currently appear to be dehydrated. Once I reduced her oxygen, in fact I have turned off she seems to be more responsive. Although she is still not conversing and still not following commands. Her O2 sat is 95% on room air #2 hypercarbic respiratory failure; suggestion of obesity hypoventilation syndrome. She will no doubt require a sleep study however she is not stable enough for this now. She has had a multitude of arterial blood gases and [place] link all of which show varying degrees of hypercarbia. It is not obvious to me however that this by itself is severe enough at baseline to result in her altered mental status #3 left upper lobe  healthcare acquired pneumonia. She's completed antibiotics at. This does not appear to be unstable #4 patient had a modified barium swallow on 9/26 with a suggesting a dysphagia 1 diet with nectar thick liquids. There is a suggestion to give her diet "when awake" #5 type 2 diabetes with nephropathy she had acute renal failure secondary to prerenal azotemia however her discharge creatinine was 1.33. Her original blood sugars were over 800 on arrival #6 hypernatremia was corrected #7 hypothyroidism TSH and free T4 within normal limits  This is not a stable situation. Although I am somewhat comforted that her mental status waxed and waned in hospital suggesting that this is not a new phenomenon. I don't know that we'll be able to deal with this in a skilled facility. She certainly could not be fed this morning [I attempted to do this]. Some of the presentation this morning may be hypercarbic encephalopathy however even turning down her oxygen to off did not completely correct this. I'm going to put her on regular nebulizers as there is some wheezing in her breath sounds are shallow.  We will need to carefully monitor her blood sugars she is receiving Lantus insulin yet is probably eating and drinking very little  If this does not correct over this morning, will try to have a conversation with her son who is her power of attorney.  Results for [name] [name], [name] [name] (MRN [id number]) as of 08/06/2014 08:58  Ref. Range 07/26/2014 04:10 07/27/2014 04:15 07/28/2014 02:50 07/28/2014 04:25 07/29/2014 04:09 07/30/2014 04:30 07/30/2014 12:00 07/31/2014 04:00 08/01/2014 04:05 08/02/2014 03:00 08/02/2014 19:30 08/03/2014 08:58 08/03/2014 09:19 08/03/2014 10:10 08/04/2014 03:30  Sample type No range found   ARTERIAL DRAW          ARTERIAL    Delivery systems No range found   VENTILATOR              FIO2 No range found   0.40              Mode No range found   PRESSURE REGULATED VOLUME CONTROL              VT No range found   480               Peep/cpap No range found   5.0  pH, Arterial Latest Range: 7.350-7.450    7.513 (H)          7.412    pCO2 arterial Latest Range: 35.0-45.0 mmHg   38.5          44.1    pO2, Arterial Latest Range: 80.0-100.0 mmHg   78.8 (L)          70.0 (L)    Bicarbonate Latest Range: 20.0-24.0 mEq/L   30.7 (H)          28.1 (H)    TCO2 Latest Range: 0-100 mmol/L   31.9          29    Acid-Base Excess Latest Range: 0.0-2.0 mmol/L   7.3 (H)          3.0 (H)    O2 Saturation No range found   98.1          94.0    Patient temperature No range found   98.6          98.6 F    Collection site No range found   BRACHIAL ARTERY          RADIAL, ALLEN'S TEST ACCEPTABLE    Allens test (pass/fail) Latest Range: PASS    PASS              Sodium Latest Range: 137-147 mEq/L 145 151 (H)  146 148 (H)  149 (H) 148 (H)  139  143   144  Potassium Latest Range: 3.7-5.3 mEq/L 3.5 (L) 3.8  3.2 (L) 3.0 (L)  3.7 3.6 (L)  3.3 (L)  3.1 (L)   3.4 (L)  Chloride Latest Range: 96-112 mEq/L 104 110  105 108  108 106  99  104   106  CO2 Latest Range: 19-32 mEq/L 30 33 (H)  30 29  30 28  26  29   24   Mean Plasma Glucose Latest Range: <117 mg/dL          161 (H)       BUN Latest Range: 6-23 mg/dL 39 (H) 49 (H)  46 (H) 47 (H)  37 (H) 31 (H)  19  17   17   Creatinine Latest Range: 0.50-1.10 mg/dL 0.96 (H) 0.45 (H)  4.09 (H) 1.30 (H)  1.30 (H) 1.27 (H)  1.28 (H)  1.33 (H)   1.33 (H)  Calcium Latest Range: 8.4-10.5 mg/dL 8.4 8.2 (L)  8.5 8.3 (L)  8.5 8.6  8.2 (L)  8.1 (L)   8.1 (L)  GFR calc non Af Amer Latest Range: >90 mL/min 35 (L) 41 (L)  41 (L) 39 (L)  39 (L) 40 (L)  40 (L)  38 (L)   38 (L)  GFR calc Af Amer Latest Range: >90 mL/min 40 (L) 48 (L)  47 (L) 45 (L)  45 (L) 47 (L)  46 (L)  44 (L)   44 (L)  Glucose Latest Range: 70-99 mg/dL 811 (H) 914 (H)  782 (H) 156 (H)  166 (H) 84  220 (H)  163 (H)   92  Anion gap Latest Range: 5-15  11 8  11 11  11 14  14  10   14   Calcium, Total (PTH) Latest Range: 8.4-10.5 mg/dL            8.5      Magnesium Latest Range: 1.5-2.5 mg/dL     2.0 2.0  2.0      2.0   Alkaline Phosphatase Latest Range: 39-117 U/L  71  Albumin Latest Range: 3.5-5.2 g/dL               2.3 (L)  AST Latest Range: 0-37 U/L               40 (H)  ALT Latest Range: 0-35 U/L               29  Total Protein Latest Range: 6.0-8.3 g/dL               5.8 (L)  Ammonia Latest Range: 11-60 umol/L              21   Total Bilirubin Latest Range: 0.3-1.2 mg/dL               <4.0 (L)  Cholesterol Latest Range: 0-200 mg/dL          981       Triglycerides Latest Range: <150 mg/dL          191       HDL Latest Range: >39 mg/dL          62       LDL (calc) Latest Range: 0-99 mg/dL          91       VLDL Latest Range: 0-40 mg/dL          27       Total CHOL/HDL Ratio No range found          2.9       Folate No range found              >20.0   Vitamin B-12 Latest Range: 211-911 pg/mL              1024 (H)   WBC Latest Range: 4.0-10.5 K/uL 12.1 (H) 7.8   7.1     7.3     6.0  RBC Latest Range: 3.87-5.11 MIL/uL 3.49 (L) 3.39 (L)   3.23 (L)     3.55 (L)     3.28 (L)  Hemoglobin Latest Range: 12.0-15.0 g/dL 47.8 (L) 9.8 (L)   9.5 (L)     10.5 (L)     9.6 (L)  HCT Latest Range: 36.0-46.0 % 31.1 (L) 30.8 (L)   28.8 (L)     31.1 (L)     29.1 (L)  MCV Latest Range: 78.0-100.0 fL 89.1 90.9   89.2     87.6     88.7  MCH Latest Range: 26.0-34.0 pg 29.8 28.9   29.4     29.6     29.3  MCHC Latest Range: 30.0-36.0 g/dL 29.5 62.1   30.8     65.7     33.0  RDW Latest Range: 11.5-15.5 % 13.6 14.0   13.8     12.9     13.2  Platelets Latest Range: 150-400 K/uL 192 178   173     261     300  Hemoglobin A1C Latest Range: <5.7 %          9.2 (H)       PTH Latest Range: 14-64 pg/mL           84 (H)      TSH Latest Range: 0.350-4.500 uIU/mL         2.650        Free T4 Latest Range: 0.80-1.80 ng/dL         8.46        RPR Latest  Range: NON REAC               NON REAC

## 2014-08-06 NOTE — ED Provider Notes (Signed)
CSN: [id number]     Arrival date & time 08/06/14  2235 History   First MD Initiated Contact with Patient 08/06/14 2252     Chief Complaint  Patient presents with  . Code Stroke    An emergency department physician performed an initial assessment on this suspected stroke patient at 2235. (Consider location/radiation/quality/duration/timing/severity/associated sxs/prior Treatment) HPI Comments: Patient brought to the emergency Department by EMS as a code stroke. She reportedly had sudden onset of aphasia at 5:30 PM at dinnertime. She was completely unable to speak upon arrival by EMS. She did, however, follow commands per EMS. Upon arrival, patient appears to be alert and oriented. She also appears to be short of breath with tachypnea, but denies this. It is not, however, clear if she is answering questions appropriately. At times she cannot speak clearly or find words. Level V Caveat due to aphasia.   Past Medical History  Diagnosis Date  . Hypertension   . Diabetes mellitus   . Chronic renal insufficiency   . Hyperlipidemia   . Osteopenia   . CVA (cerebral infarction)   . Thyroid disease    Past Surgical History  Procedure Laterality Date  . Eye surgery    . Loop recorder implant  03-24-2014    MDT LinQ implanted by Dr Johney FrameAllred for cryptogenic stroke  . Tee without cardioversion N/A 03/23/2014    Procedure: TRANSESOPHAGEAL ECHOCARDIOGRAM (TEE);  Surgeon: Lars MassonKatarina H Nelson, MD;  [facility];  Service: Cardiovascular;  Laterality: N/A;   Family History  Problem Relation Age of Onset  . Diabetes Mother   . Hypertension Mother    History  Substance Use Topics  . Smoking status: Former Smoker -- 0.50 packs/day for 60 years    Types: Cigarettes    Quit date: 06/09/2014  . Smokeless tobacco: Never Used     Comment: pt states "sometimes I quit smoking for about 2 weeks then I start back"  . Alcohol Use: Yes     Comment: occasionally   OB History   Grav Para Term Preterm  Abortions TAB SAB Ect Mult Living                 Review of Systems  Unable to perform ROS Neurological: Positive for speech difficulty.      Allergies  Erythromycin; Iodine; and Penicillins  Home Medications   Prior to Admission medications   Medication Sig Start Date End Date Taking? Authorizing Provider  acetaminophen (TYLENOL) 325 MG tablet Take 650 mg by mouth every 6 (six) hours as needed for mild pain.   Yes Historical Provider, MD  albuterol (PROVENTIL) (2.5 MG/3ML) 0.083% nebulizer solution Take 2.5 mg by nebulization every 6 (six) hours as needed for wheezing or shortness of breath.   Yes Historical Provider, MD  atorvastatin (LIPITOR) 40 MG tablet Take 40 mg by mouth daily.   Yes Historical Provider, MD  budesonide-formoterol (SYMBICORT) 80-4.5 MCG/ACT inhaler Inhale 2 puffs into the lungs 2 (two) times daily.   Yes Historical Provider, MD  carvedilol (COREG) 25 MG tablet Take 25 mg by mouth 2 (two) times daily with a meal.   Yes Historical Provider, MD  cloNIDine (CATAPRES) 0.1 MG tablet Take 0.1 mg by mouth 3 (three) times daily.   Yes Historical Provider, MD  clopidogrel (PLAVIX) 75 MG tablet Take 75 mg by mouth daily.   Yes Historical Provider, MD  hydrALAZINE (APRESOLINE) 100 MG tablet Take 100 mg by mouth 3 (three) times daily.   Yes Historical Provider,  MD  insulin aspart (NOVOLOG) 100 UNIT/ML injection Inject 0-15 Units into the skin 3 (three) times daily before meals. *70-120=0 units, 121-150=3 units, 151-200=4 units, 201-250=7 units, 251-300=11 units, 301-350=15 units, greater than 400 call MD*   Yes Historical Provider, MD  insulin glargine (LANTUS) 100 UNIT/ML injection Inject 13 Units into the skin at bedtime.   Yes Historical Provider, MD  isosorbide dinitrate (ISORDIL) 10 MG tablet Take 10 mg by mouth 3 (three) times daily.   Yes Historical Provider, MD  levothyroxine (SYNTHROID, LEVOTHROID) 50 MCG tablet Take 50 mcg by mouth daily before breakfast.   Yes  Historical Provider, MD   BP 159/101  Pulse 75  Temp(Src) 98.8 F (37.1 C) (Oral)  Resp 27  Wt 170 lb (77.111 kg)  SpO2 99% Physical Exam  Constitutional: She appears well-developed and well-nourished. No distress.  HENT:  Head: Normocephalic and atraumatic.  Right Ear: Hearing normal.  Left Ear: Hearing normal.  Nose: Nose normal.  Mouth/Throat: Oropharynx is clear and moist and mucous membranes are normal.  Eyes: Conjunctivae and EOM are normal. Pupils are equal, round, and reactive to light.  Neck: Normal range of motion. Neck supple.  Cardiovascular: Regular rhythm, S1 normal and S2 normal.  Exam reveals no gallop and no friction rub.   No murmur heard. Pulmonary/Chest: Effort normal and breath sounds normal. No respiratory distress. She exhibits no tenderness.  Abdominal: Soft. Normal appearance and bowel sounds are normal. There is no hepatosplenomegaly. There is no tenderness. There is no rebound, no guarding, no tenderness at McBurney's point and negative Murphy's sign. No hernia.  Musculoskeletal: Normal range of motion.  Neurological: She is alert. She has normal strength. No cranial nerve deficit or sensory deficit. Coordination normal. GCS eye subscore is 4. GCS verbal subscore is 4. GCS motor subscore is 6.  Patient follows commands with all 4 extremities equally. She is beginning to be able to converse, but still has trouble finding words  Skin: Skin is warm, dry and intact. No rash noted. No cyanosis.  Psychiatric: She has a normal mood and affect. Her speech is normal and behavior is normal. Thought content normal.    ED Course  Procedures (including critical care time) Labs Review Labs Reviewed  APTT - Abnormal; Notable for the following:    aPTT 23 (*)    All other components within normal limits  CBC - Abnormal; Notable for the following:    RBC 3.09 (*)    Hemoglobin 9.1 (*)    HCT 28.0 (*)    All other components within normal limits  COMPREHENSIVE  METABOLIC PANEL - Abnormal; Notable for the following:    Glucose, Bld 181 (*)    Creatinine, Ser 1.27 (*)    Calcium 8.3 (*)    Total Protein 5.9 (*)    Albumin 2.3 (*)    Total Bilirubin 0.2 (*)    GFR calc non Af Amer 40 (*)    GFR calc Af Amer 47 (*)    All other components within normal limits  I-STAT CHEM 8, ED - Abnormal; Notable for the following:    Creatinine, Ser 1.20 (*)    Glucose, Bld 177 (*)    Calcium, Ion 1.12 (*)    Hemoglobin 9.2 (*)    HCT 27.0 (*)    All other components within normal limits  I-STAT ARTERIAL BLOOD GAS, ED - Abnormal; Notable for the following:    pO2, Arterial 65.0 (*)    Bicarbonate 28.7 (*)  Acid-Base Excess 4.0 (*)    All other components within normal limits  CBG MONITORING, ED - Abnormal; Notable for the following:    Glucose-Capillary 158 (*)    All other components within normal limits  ETHANOL  PROTIME-INR  DIFFERENTIAL  URINE RAPID DRUG SCREEN (HOSP PERFORMED)  URINALYSIS, ROUTINE W REFLEX MICROSCOPIC  PRO B NATRIURETIC PEPTIDE  I-STAT TROPOININ, ED  I-STAT TROPOININ, ED    Imaging Review Ct Head Wo Contrast  08/06/2014   CLINICAL DATA:  Code stroke.  Facial droop.  EXAM: CT HEAD WITHOUT CONTRAST  TECHNIQUE: Contiguous axial images were obtained from the base of the skull through the vertex without intravenous contrast.  COMPARISON:  08/04/2014  FINDINGS: Diffuse cerebral atrophy. Ventricular dilatation consistent with central atrophy. Low-attenuation changes throughout the deep white matter consistent with small vessel ischemia. Old lacunar infarcts in the right caudate lobe, left thalamus, and left cerebellum. No change since previous study. No mass effect or midline shift. No abnormal extra-axial fluid collections. Gray-white matter junctions are distinct. Basal cisterns are not effaced. No evidence of acute intracranial hemorrhage. No depressed skull fractures. Small retention cyst in the sphenoid sinus. Mastoid air cells are  not opacified.  IMPRESSION: No acute intracranial abnormalities. Chronic atrophy and small vessel ischemic changes with multiple old lacunar infarcts.  These results were called by telephone at the time of interpretation on 08/06/2014 at 10:52 pm to Dr. Vanetta Mulders , who verbally acknowledged these results.   Electronically Signed   By: Burman Nieves M.D.   On: 08/06/2014 22:54     EKG Interpretation   Date/Time:  Thursday August 06 2014 22:49:36 EDT Ventricular Rate:  84 PR Interval:    QRS Duration: 70 QT Interval:  403 QTC Calculation: 476 R Axis:   93 Text Interpretation:  Atrial fibrillation Ventricular premature complex  Anterior infarct, acute (LAD) Baseline wander in lead(s) V2 No significant  change since 07/24/14 Confirmed by DELOS  MD, DOUGLAS ([id number]) on 08/06/2014  11:05:41 PM      MDM   Final diagnoses:  Acute CVA (cerebrovascular accident)  DM type 2, uncontrolled, with renal complications  Essential hypertension     Patient presented to the ER as a code stroke. She was evaluated with neurology, Doctor Roseanne Reno. Patient does have a improving expressive aphasia. She is out of the window for thrombolytics. Doctor Roseanne Reno recommended she be admitted to the hospital for workup of TIA versus CVA.  Patient does appear to have some shortness of breath. She is breathing shallow and has some tachypnea. She has not, however, hypoxic. Blood gases now show any CO2 retention.   Gilda Crease, MD 08/06/14 [phone]

## 2014-08-06 NOTE — Code Documentation (Signed)
73 [facility] b[facility].  Pt was LKW at 1730 when she was talking and interactive at her baseline.  On arrival pt is responsive & able to follow commands but nonverbal.  After CT scan & arriving in the ED room the pt was able to answer questions though still with some exp. Aphasia.  Code stroke was canceled by Dr. Roseanne RenoStewart.  See doc flowsheets for code stroke times.

## 2014-08-07 DIAGNOSIS — I369 Nonrheumatic tricuspid valve disorder, unspecified: Secondary | ICD-10-CM

## 2014-08-07 DIAGNOSIS — E1142 Type 2 diabetes mellitus with diabetic polyneuropathy: Secondary | ICD-10-CM

## 2014-08-07 DIAGNOSIS — N183 Chronic kidney disease, stage 3 (moderate): Secondary | ICD-10-CM

## 2014-08-07 DIAGNOSIS — R4701 Aphasia: Secondary | ICD-10-CM | POA: Diagnosis present

## 2014-08-07 DIAGNOSIS — I639 Cerebral infarction, unspecified: Secondary | ICD-10-CM

## 2014-08-07 DIAGNOSIS — K219 Gastro-esophageal reflux disease without esophagitis: Secondary | ICD-10-CM

## 2014-08-07 DIAGNOSIS — E039 Hypothyroidism, unspecified: Secondary | ICD-10-CM

## 2014-08-07 DIAGNOSIS — Z8673 Personal history of transient ischemic attack (TIA), and cerebral infarction without residual deficits: Secondary | ICD-10-CM

## 2014-08-07 DIAGNOSIS — G9340 Encephalopathy, unspecified: Secondary | ICD-10-CM

## 2014-08-07 DIAGNOSIS — G459 Transient cerebral ischemic attack, unspecified: Secondary | ICD-10-CM | POA: Diagnosis present

## 2014-08-07 DIAGNOSIS — R1310 Dysphagia, unspecified: Secondary | ICD-10-CM

## 2014-08-07 DIAGNOSIS — J441 Chronic obstructive pulmonary disease with (acute) exacerbation: Secondary | ICD-10-CM

## 2014-08-07 LAB — COMPREHENSIVE METABOLIC PANEL
ALT: 16 U/L (ref 0–35)
AST: 20 U/L (ref 0–37)
Albumin: 2.3 g/dL — ABNORMAL LOW (ref 3.5–5.2)
Alkaline Phosphatase: 68 U/L (ref 39–117)
Anion gap: 10 (ref 5–15)
BUN: 11 mg/dL (ref 6–23)
CO2: 27 meq/L (ref 19–32)
Calcium: 8.4 mg/dL (ref 8.4–10.5)
Chloride: 110 mEq/L (ref 96–112)
Creatinine, Ser: 1.28 mg/dL — ABNORMAL HIGH (ref 0.50–1.10)
GFR calc Af Amer: 46 mL/min — ABNORMAL LOW (ref >90)
GFR, EST NON AFRICAN AMERICAN: 40 mL/min — AB (ref >90)
Glucose, Bld: 137 mg/dL — ABNORMAL HIGH (ref 70–99)
POTASSIUM: 4.6 meq/L (ref 3.7–5.3)
SODIUM: 147 meq/L (ref 137–147)
Total Bilirubin: <0.2 mg/dL — ABNORMAL LOW (ref 0.3–1.2)
Total Protein: 5.9 g/dL — ABNORMAL LOW (ref 6.0–8.3)

## 2014-08-07 LAB — CBC WITH DIFFERENTIAL/PLATELET
BASOS ABS: 0 10*3/uL (ref 0.0–0.1)
Basophils Relative: 0 % (ref 0–1)
EOS PCT: 0 % (ref 0–5)
Eosinophils Absolute: 0 10*3/uL (ref 0.0–0.7)
HCT: 29.8 % — ABNORMAL LOW (ref 36.0–46.0)
Hemoglobin: 9.5 g/dL — ABNORMAL LOW (ref 12.0–15.0)
LYMPHS PCT: 18 % (ref 12–46)
Lymphs Abs: 1.7 10*3/uL (ref 0.7–4.0)
MCH: 29.9 pg (ref 26.0–34.0)
MCHC: 31.9 g/dL (ref 30.0–36.0)
MCV: 93.7 fL (ref 78.0–100.0)
Monocytes Absolute: 1.1 10*3/uL — ABNORMAL HIGH (ref 0.1–1.0)
Monocytes Relative: 12 % (ref 3–12)
NEUTROS PCT: 70 % (ref 43–77)
Neutro Abs: 6.7 10*3/uL (ref 1.7–7.7)
PLATELETS: 298 10*3/uL (ref 150–400)
RBC: 3.18 MIL/uL — ABNORMAL LOW (ref 3.87–5.11)
RDW: 13.7 % (ref 11.5–15.5)
WBC: 9.6 10*3/uL (ref 4.0–10.5)

## 2014-08-07 LAB — ALDOSTERONE + RENIN ACTIVITY W/ RATIO
ALDO / PRA RATIO: 1.7 ratio (ref 0.9–28.9)
Aldosterone: 6 ng/dL
PRA LC/MS/MS: 3.46 ng/mL/h (ref 0.25–5.82)

## 2014-08-07 LAB — LIPID PANEL
CHOL/HDL RATIO: 2.6 ratio
Cholesterol: 150 mg/dL (ref 0–200)
HDL: 57 mg/dL (ref >39)
LDL Cholesterol: 73 mg/dL (ref 0–99)
TRIGLYCERIDES: 101 mg/dL (ref <150)
VLDL: 20 mg/dL (ref 0–40)

## 2014-08-07 LAB — PROTIME-INR
INR: 1.17 (ref 0.00–1.49)
Prothrombin Time: 14.9 seconds (ref 11.6–15.2)

## 2014-08-07 LAB — RAPID URINE DRUG SCREEN, HOSP PERFORMED
Amphetamines: NOT DETECTED
BARBITURATES: NOT DETECTED
Benzodiazepines: NOT DETECTED
Cocaine: NOT DETECTED
OPIATES: NOT DETECTED
TETRAHYDROCANNABINOL: NOT DETECTED

## 2014-08-07 LAB — RHEUMATOID FACTOR

## 2014-08-07 LAB — HOMOCYSTEINE: Homocysteine: 12 umol/L (ref 4.0–15.4)

## 2014-08-07 LAB — GLUCOSE, CAPILLARY
GLUCOSE-CAPILLARY: 67 mg/dL — AB (ref 70–99)
Glucose-Capillary: 149 mg/dL — ABNORMAL HIGH (ref 70–99)
Glucose-Capillary: 114 mg/dL — ABNORMAL HIGH (ref 70–99)
Glucose-Capillary: 97 mg/dL (ref 70–99)
Glucose-Capillary: 139 mg/dL — ABNORMAL HIGH (ref 70–99)

## 2014-08-07 LAB — HEMOGLOBIN A1C
Hgb A1c MFr Bld: 9 % — ABNORMAL HIGH (ref <5.7)
Mean Plasma Glucose: 212 mg/dL — ABNORMAL HIGH (ref <117)

## 2014-08-07 MED ORDER — KCL IN DEXTROSE-NACL 20-5-0.45 MEQ/L-%-% IV SOLN
INTRAVENOUS | Status: DC
  Administered 2014-08-07: 75 mL/h via INTRAVENOUS
  Administered 2014-08-08: 50 mL/h via INTRAVENOUS
  Administered 2014-08-08 – 2014-08-09 (×2): via INTRAVENOUS
  Filled 2014-08-07 (×7): qty 1000

## 2014-08-07 MED ORDER — CHLORHEXIDINE GLUCONATE 0.12 % MT SOLN
15.0000 mL | Freq: Two times a day (BID) | OROMUCOSAL | Status: DC
  Administered 2014-08-07 – 2014-08-20 (×25): 15 mL via OROMUCOSAL
  Filled 2014-08-07 (×23): qty 15

## 2014-08-07 MED ORDER — DEXTROSE 50 % IV SOLN
INTRAVENOUS | Status: AC
  Filled 2014-08-07: qty 50

## 2014-08-07 MED ORDER — LEVOTHYROXINE SODIUM 50 MCG PO TABS
50.0000 ug | ORAL_TABLET | Freq: Every day | ORAL | Status: DC
  Filled 2014-08-07 (×4): qty 1

## 2014-08-07 MED ORDER — HYDRALAZINE HCL 100 MG PO TABS
100.0000 mg | ORAL_TABLET | Freq: Three times a day (TID) | ORAL | Status: DC
  Filled 2014-08-07 (×6): qty 1

## 2014-08-07 MED ORDER — BUDESONIDE-FORMOTEROL FUMARATE 80-4.5 MCG/ACT IN AERO
2.0000 | INHALATION_SPRAY | Freq: Two times a day (BID) | RESPIRATORY_TRACT | Status: DC
  Administered 2014-08-10 – 2014-08-20 (×19): 2 via RESPIRATORY_TRACT
  Filled 2014-08-07 (×3): qty 6.9

## 2014-08-07 MED ORDER — CLOPIDOGREL BISULFATE 75 MG PO TABS
75.0000 mg | ORAL_TABLET | Freq: Every day | ORAL | Status: DC
  Filled 2014-08-07 (×2): qty 1

## 2014-08-07 MED ORDER — HYDRALAZINE HCL 20 MG/ML IJ SOLN
10.0000 mg | INTRAMUSCULAR | Status: DC | PRN
  Administered 2014-08-07 – 2014-08-08 (×2): 10 mg via INTRAVENOUS
  Filled 2014-08-07 (×3): qty 1

## 2014-08-07 MED ORDER — HEPARIN SODIUM (PORCINE) 5000 UNIT/ML IJ SOLN
5000.0000 [IU] | Freq: Three times a day (TID) | INTRAMUSCULAR | Status: DC
  Administered 2014-08-07 – 2014-08-20 (×38): 5000 [IU] via SUBCUTANEOUS
  Filled 2014-08-07 (×38): qty 1

## 2014-08-07 MED ORDER — STROKE: EARLY STAGES OF RECOVERY BOOK
Freq: Once | Status: AC
  Administered 2014-08-07: 03:00:00
  Filled 2014-08-07: qty 1

## 2014-08-07 MED ORDER — DEXTROSE 50 % IV SOLN
25.0000 mL | Freq: Once | INTRAVENOUS | Status: AC | PRN
Start: 2014-08-07 — End: 2014-08-07
  Administered 2014-08-07: 25 mL via INTRAVENOUS

## 2014-08-07 MED ORDER — CARVEDILOL 25 MG PO TABS
25.0000 mg | ORAL_TABLET | Freq: Two times a day (BID) | ORAL | Status: DC
  Filled 2014-08-07 (×5): qty 1

## 2014-08-07 MED ORDER — SENNOSIDES-DOCUSATE SODIUM 8.6-50 MG PO TABS: 1.0000 | ORAL_TABLET | Freq: Every evening | ORAL | Status: DC | PRN

## 2014-08-07 MED ORDER — CLONIDINE HCL 0.1 MG PO TABS
0.1000 mg | ORAL_TABLET | Freq: Three times a day (TID) | ORAL | Status: DC
  Filled 2014-08-07 (×3): qty 1

## 2014-08-07 MED ORDER — CLONIDINE HCL 0.3 MG/24HR TD PTWK
0.3000 mg | MEDICATED_PATCH | TRANSDERMAL | Status: DC
  Administered 2014-08-07 (×2): 0.3 mg via TRANSDERMAL
  Filled 2014-08-07 (×2): qty 1

## 2014-08-07 MED ORDER — INSULIN ASPART 100 UNIT/ML ~~LOC~~ SOLN
0.0000 [IU] | Freq: Four times a day (QID) | SUBCUTANEOUS | Status: DC
  Administered 2014-08-07 (×2): 2 [IU] via SUBCUTANEOUS

## 2014-08-07 MED ORDER — ATORVASTATIN CALCIUM 40 MG PO TABS
40.0000 mg | ORAL_TABLET | Freq: Every day | ORAL | Status: DC
  Administered 2014-08-15 – 2014-08-20 (×6): 40 mg via ORAL
  Filled 2014-08-07 (×9): qty 1

## 2014-08-07 MED ORDER — CETYLPYRIDINIUM CHLORIDE 0.05 % MT LIQD
7.0000 mL | Freq: Two times a day (BID) | OROMUCOSAL | Status: DC
  Administered 2014-08-07 – 2014-08-20 (×23): 7 mL via OROMUCOSAL

## 2014-08-07 NOTE — H&P (Signed)
[facility] History and Physical  Patient: [name] [name]  ZOX:[id number]  DOB: [birth year]  DOS: the patient was seen and examined on 08/07/2014 PCP: Alva Garnet., MD  Chief Complaint: Inability to speak  HPI: [name] [name] is a 73 y.o. female with Past medical history of CVA, hypertension, diabetes mellitus, hypothyroidism, recent admission for respiratory failure requiring intubation. The patient is presenting with complaints of inability to speak which was noticed at around 5:30 PM. The history is obtained from ED documentation since the patient is unable to provide any significant history. The patient's primary complaint was that she has been getting too much of medications. Patient was refusing her medication and was nonverbal and therefore she was brought in as a code stroke. Neurology evaluated the patient and canceled the cord stroke and recommended medicine admission.  The patient is coming from [facility] [place] . And at her baseline dependent for most of her ADL.  Review of Systems: as mentioned in the history of present illness.  A Comprehensive review of the other systems is negative.  Past Medical History  Diagnosis Date  . Hypertension   . Diabetes mellitus   . Chronic renal insufficiency   . Hyperlipidemia   . Osteopenia   . CVA (cerebral infarction)   . Thyroid disease    Past Surgical History  Procedure Laterality Date  . Eye surgery    . Loop recorder implant  03-24-2014    MDT LinQ implanted by Dr Johney Frame for cryptogenic stroke  . Tee without cardioversion N/A 03/23/2014    Procedure: TRANSESOPHAGEAL ECHOCARDIOGRAM (TEE);  Surgeon: Lars Masson, MD;  Location: [facility];  Service: Cardiovascular;  Laterality: N/A;   Social History:  reports that she quit smoking about 8 weeks ago. Her smoking use included Cigarettes. She has a 30 pack-year smoking history. She has never used smokeless tobacco. She reports that she drinks alcohol. She reports that  she does not use illicit drugs.  Allergies  Allergen Reactions  . Erythromycin Shortness Of Breath  . Iodine Hives  . Penicillins Hives    Family History  Problem Relation Age of Onset  . Diabetes Mother   . Hypertension Mother     Prior to Admission medications   Medication Sig Start Date End Date Taking? Authorizing Provider  acetaminophen (TYLENOL) 325 MG tablet Take 650 mg by mouth every 6 (six) hours as needed for mild pain.   Yes Historical Provider, MD  albuterol (PROVENTIL) (2.5 MG/3ML) 0.083% nebulizer solution Take 2.5 mg by nebulization every 6 (six) hours as needed for wheezing or shortness of breath.   Yes Historical Provider, MD  atorvastatin (LIPITOR) 40 MG tablet Take 40 mg by mouth daily.   Yes Historical Provider, MD  budesonide-formoterol (SYMBICORT) 80-4.5 MCG/ACT inhaler Inhale 2 puffs into the lungs 2 (two) times daily.   Yes Historical Provider, MD  carvedilol (COREG) 25 MG tablet Take 25 mg by mouth 2 (two) times daily with a meal.   Yes Historical Provider, MD  cloNIDine (CATAPRES) 0.1 MG tablet Take 0.1 mg by mouth 3 (three) times daily.   Yes Historical Provider, MD  clopidogrel (PLAVIX) 75 MG tablet Take 75 mg by mouth daily.   Yes Historical Provider, MD  hydrALAZINE (APRESOLINE) 100 MG tablet Take 100 mg by mouth 3 (three) times daily.   Yes Historical Provider, MD  insulin aspart (NOVOLOG) 100 UNIT/ML injection Inject 0-15 Units into the skin 3 (three) times daily before meals. *70-120=0 units, 121-150=3 units, 151-200=4 units,  201-250=7 units, 251-300=11 units, 301-350=15 units, greater than 400 call MD*   Yes Historical Provider, MD  insulin glargine (LANTUS) 100 UNIT/ML injection Inject 13 Units into the skin at bedtime.   Yes Historical Provider, MD  isosorbide dinitrate (ISORDIL) 10 MG tablet Take 10 mg by mouth 3 (three) times daily.   Yes Historical Provider, MD  levothyroxine (SYNTHROID, LEVOTHROID) 50 MCG tablet Take 50 mcg by mouth daily before  breakfast.   Yes Historical Provider, MD    Physical Exam: Filed Vitals:   08/07/14 0100 08/07/14 0115 08/07/14 0145 08/07/14 0200  BP: 167/62 157/96 168/64 172/57  Pulse:   73 73  Temp:      TempSrc:      Resp:    25  Weight:      SpO2:   100% 100%    General: Alert, Awake and Oriented to Time, Place and Person. Appear in mild distress Eyes: PERRL ENT: Oral Mucosa clear moist. Neck: no JVD Cardiovascular: S1 and S2 Present,  aortic systolic  Murmur, Peripheral Pulses Present Respiratory: Bilateral Air entry equal and Decreased, Clear to Auscultation, noCrackles, no wheezes, upper respiratory expiratory wheezes  Abdomen: Bowel Sound  present , Soft and Non tender Skin: no Rash Extremities: no Pedal edema, no calf tenderness Neurologic: Grossly no focal neuro deficit.\ Patient is able to follow command, more on the extremities, left extremity strength 3 x 5 right 4 x 5, reflexes present, patient is able to answer all questions   Labs on Admission:  CBC:  Recent Labs Lab 08/02/14 0300 08/04/14 0330 08/06/14 2230 08/06/14 2245  WBC 7.3 6.0 9.4  --   NEUTROABS  --   --  7.0  --   HGB 10.5* 9.6* 9.1* 9.2*  HCT 31.1* 29.1* 28.0* 27.0*  MCV 87.6 88.7 90.6  --   PLT 261 300 290  --     CMP     Component Value Date/Time   NA 144 08/06/2014 2245   K 3.9 08/06/2014 2245   CL 110 08/06/2014 2245   CO2 27 08/06/2014 2230   GLUCOSE 177* 08/06/2014 2245   BUN 9 08/06/2014 2245   CREATININE 1.20* 08/06/2014 2245   CALCIUM 8.3* 08/06/2014 2230   CALCIUM 8.5 08/02/2014 1930   PROT 5.9* 08/06/2014 2230   ALBUMIN 2.3* 08/06/2014 2230   AST 21 08/06/2014 2230   ALT 16 08/06/2014 2230   ALKPHOS 67 08/06/2014 2230   BILITOT 0.2* 08/06/2014 2230   GFRNONAA 40* 08/06/2014 2230   GFRAA 47* 08/06/2014 2230    No results found for this basename: LIPASE, AMYLASE,  in the last 168 hours  Recent Labs Lab 08/03/14 1010  AMMONIA 21    No results found for this basename: CKTOTAL, CKMB,  CKMBINDEX, TROPONINI,  in the last 168 hours BNP (last 3 results)  Recent Labs  06/09/14 1344 06/12/14 1330 07/24/14 1000  PROBNP 1271.0* 1666.0* 6064.0*    Radiological Exams on Admission: Ct Head Wo Contrast  08/06/2014   CLINICAL DATA:  Code stroke.  Facial droop.  EXAM: CT HEAD WITHOUT CONTRAST  TECHNIQUE: Contiguous axial images were obtained from the base of the skull through the vertex without intravenous contrast.  COMPARISON:  08/04/2014  FINDINGS: Diffuse cerebral atrophy. Ventricular dilatation consistent with central atrophy. Low-attenuation changes throughout the deep white matter consistent with small vessel ischemia. Old lacunar infarcts in the right caudate lobe, left thalamus, and left cerebellum. No change since previous study. No mass effect or midline shift. No abnormal  extra-axial fluid collections. Gray-white matter junctions are distinct. Basal cisterns are not effaced. No evidence of acute intracranial hemorrhage. No depressed skull fractures. Small retention cyst in the sphenoid sinus. Mastoid air cells are not opacified.  IMPRESSION: No acute intracranial abnormalities. Chronic atrophy and small vessel ischemic changes with multiple old lacunar infarcts.  These results were called by telephone at the time of interpretation on 08/06/2014 at 10:52 pm to Dr. Vanetta Mulders , who verbally acknowledged these results.   Electronically Signed   By: Burman Nieves M.D.   On: 08/06/2014 22:54   Dg Chest Port 1 View  08/07/2014   CLINICAL DATA:  Short of breath. Labored in shallow breathing. Unresponsive.  EXAM: PORTABLE CHEST - 1 VIEW  COMPARISON:  07/29/2014  FINDINGS: Probable linear atelectasis in the left mid lung. Normal heart size and pulmonary vascularity. No focal airspace disease in the lungs. No blunting of costophrenic angles. No pneumothorax. Mediastinal contours appear intact.  IMPRESSION: No active disease.   Electronically Signed   By: Burman Nieves M.D.   On:  08/07/2014 00:02    EKG: Independently reviewed. normal sinus rhythm, nonspecific ST and T waves changes. Assessment/Plan Principal Problem:   TIA (transient ischemic attack) Active Problems:   History of CVA (cerebrovascular accident)   DM type 2, uncontrolled, with renal complications   CKD (chronic kidney disease), stage III   Hypothyroidism   Diabetic neuropathy   COPD (chronic obstructive pulmonary disease)   Aphasia   1. TIA (transient ischemic attack)  the patient is presenting with complaints of expressive aphasia. She was mentioning that she was getting too much of medication and therefore she was refusing her medications. She was initially brought in as a code stroke which was canceled. Her CT of the head did not show any acute abnormality at present. There is accident many mental status for the patient. Last admission she had an EEG which was negative in August 15. With this the patient will currently be admitted in the step down unit for close monitoring. We will obtain serial neuro checks. Further workup depending on neurology recommendation.  2. accelerated hypertension. Blood pressure mildly elevated. Patient is on clonidine from the nursing home 0.3 mg 24 hour patch. At present we will continue the clonidine patch and continue other medications starting tomorrow.  3.Recent CVA. PTOT consultation in the morning. Patient was placed on dysphagia diet. At present she'll be n.p.o. and we will reconsult speech in the morning.  4. hypothyroidism. Continue Synthroid.  5 COPD. Patient has expiratory wheezing and possible secretion pooling but no signs of pneumonia as well as no signs of COPD exacerbation at present. Continued glucose monitor.  Consults:  neurology   DVT Prophylaxis: subcutaneous Heparin Nutrition:  n.p.o.   Code Status:  full presumed   Disposition: Admitted to observation step-down unit.  Author: Lynden Oxford, MD [facility] Pager:  [phone] 08/07/2014, 2:34 AM    If 7PM-7AM, please contact night-coverage [email or web address] Password [email or web address]

## 2014-08-07 NOTE — Progress Notes (Signed)
Spoke with attending regarding plan of care.  He will review the patient and if admission is warranted he will consider a goals of care meeting as indicated.  If she is not admitted he will [facility] or [facility]F [facility] choice.  Will continue to monitor.  Of note, [facility] Care [facility] services does not replace or interfere with any services that are arranged by inpatient case management or social work.  For additional questions or referrals please contact Anibal Hendersonim Henderson BSN RN [facility] Hospital Liaison at [phone].

## 2014-08-07 NOTE — Progress Notes (Signed)
STROKE TEAM PROGRESS NOTE   HISTORY [name] [name] history of previous strokes, diabetes mellitus, hypertension and hyperlipidemia as well as chronic kidney disease and [facility], having been admitted for acute encephalopathy with hypercarbia and respiratory failure requiring intubation, uncontrolled hypertension and uncontrolled diabetes mellitus. She experienced sudden onset of inability to speak at 5:30 PM today 08/06/2014. No focal weakness was noted. She had no breathing difficulty. There was no change in consciousness. Patient arrived in the emergency room and code stroke status. CT scan of her head showed no acute intracranial abnormality. She does be on time window for treatment intervention with TPA, a.m. she had begun to speak after arriving in the emergency room. Patient was not administered TPA secondary to resolving symptoms.  She was admitted for further evaluation and treatment.   SUBJECTIVE (INTERVAL HISTORY) No one is at the bedside.  Overall she feels her condition is stable. She had multiple stroke in the past, including 01/2014 at left parietal WM, 03/2014 at left BG, left cerebellar, left temporal lobe as well as 04/2014 at right caudate head. She had TEE in 03/2014 which was negative and loop recorder placed at the end of 2015 which until today has not showed any afib episodes.    OBJECTIVE Temp:  [98 F (36.7 C)-98.8 F (37.1 C)] 98.2 F (36.8 C) (10/02 1222) Pulse Rate:  [71-81] 71 (10/02 0249) Cardiac Rhythm:  [-] Sinus tachycardia;Normal sinus rhythm (10/02 0217) Resp:  [25-30] 25 (10/02 0200) BP: (152-179)/(50-101) 159/73 mmHg (10/02 1254) SpO2:  [94 %-100 %] 100 % (10/02 0400) Weight:  [75.6 kg (166 lb 10.7 oz)-77.111 kg (170 lb)] 75.6 kg (166 lb 10.7 oz) (10/02 0217)   Recent Labs Lab 08/05/14 0750 08/05/14 1311 08/06/14 2301 08/07/14 0258 08/07/14 0750  GLUCAP 91 114* 158* 149* 114*    Recent Labs Lab 08/02/14 0300   08/03/14 0858 08/03/14 1010 08/04/14 0330 08/06/14 2230 08/06/14 2245 08/07/14 0726  NA 139  --  143  --  144 144 144 147  K 3.3*  --  3.1*  --  3.4* 4.0 3.9 4.6  CL 99  --  104  --  106 107 110 110  CO2 26  --  29  --  24 27  --  27  GLUCOSE 220*  --  163*  --  92 181* 177* 137*  BUN 19  --  17  --  17 11 9 11   CREATININE 1.28*  --  1.33*  --  1.33* 1.27* 1.20* 1.28*  CALCIUM 8.2*  < > 8.1*  --  8.1* 8.3*  --  8.4  MG  --   --   --  2.0  --   --   --   --   < > = values in this interval not displayed.  Recent Labs Lab 08/04/14 0330 08/06/14 2230 08/07/14 0726  AST 40* 21 20  ALT 29 16 16   ALKPHOS 71 67 68  BILITOT <0.2* 0.2* <0.2*  PROT 5.8* 5.9* 5.9*  ALBUMIN 2.3* 2.3* 2.3*    Recent Labs Lab 08/02/14 0300 08/04/14 0330 08/06/14 2230 08/06/14 2245 08/07/14 0726  WBC 7.3 6.0 9.4  --  9.6  NEUTROABS  --   --  7.0  --  6.7  HGB 10.5* 9.6* 9.1* 9.2* 9.5*  HCT 31.1* 29.1* 28.0* 27.0* 29.8*  MCV 87.6 88.7 90.6  --  93.7  PLT 261 300 290  --  298  No results found for this basename: CKTOTAL, CKMB, CKMBINDEX, TROPONINI,  in the last 168 hours  Recent Labs  08/06/14 2230 08/07/14 0726  LABPROT 14.3 14.9  INR 1.11 1.17    Recent Labs  08/06/14 2317  COLORURINE YELLOW  LABSPEC 1.017  PHURINE 5.0  GLUCOSEU NEGATIVE  HGBUR NEGATIVE  BILIRUBINUR NEGATIVE  KETONESUR NEGATIVE  PROTEINUR 100*  UROBILINOGEN 0.2  NITRITE NEGATIVE  LEUKOCYTESUR SMALL*       Component Value Date/Time   CHOL 150 08/07/2014 0324   TRIG 101 08/07/2014 0324   HDL 57 08/07/2014 0324   CHOLHDL 2.6 08/07/2014 0324   VLDL 20 08/07/2014 0324   LDLCALC 73 08/07/2014 0324   Lab Results  Component Value Date   HGBA1C 9.2* 08/02/2014      Component Value Date/Time   LABOPIA NONE DETECTED 08/06/2014 2317   COCAINSCRNUR NONE DETECTED 08/06/2014 2317   LABBENZ NONE DETECTED 08/06/2014 2317   AMPHETMU NONE DETECTED 08/06/2014 2317   THCU NONE DETECTED 08/06/2014 2317   LABBARB NONE DETECTED  08/06/2014 2317     Recent Labs Lab 08/06/14 2230  ETH <11    Ct Head Wo Contrast 08/06/2014    No acute intracranial abnormalities. Chronic atrophy and small vessel ischemic changes with multiple old lacunar infarcts.   08/04/2014 Mild diffuse cortical atrophy. Moderate chronic ischemic white matter disease. No acute intracranial abnormality seen.  Dg Chest Port 1 View 08/07/2014   No active disease.     MRI and MRA - pending  Carotid Doppler  No evidence of hemodynamically significant internal carotid artery stenosis. Vertebral artery flow is antegrade.   Lower extremity Venous Doppler  Bilateral: No evidence of DVT, superficial thrombosis, or Baker's Cyst.   Upper extremity venous doppler  Bilateral: No evidence of DVT or superficial thrombosis.  2D echo  - Left ventricle: The cavity size was normal. Wall thickness was normal. Systolic function was vigorous. The estimated ejection fraction was in the range of 65% to 70%. Doppler parameters are consistent with abnormal left ventricular relaxation (grade 1 diastolic dysfunction). Doppler parameters are consistent with elevated mean left atrial filling pressure. - Tricuspid valve: There was mild-moderate regurgitation. - Pulmonary arteries: PA peak pressure: 53 mm Hg (S). - Pericardium, extracardiac: A trivial pericardial effusion was identified.  Hypercoagulable and autoimmune work up - pending  PHYSICAL EXAM  Temp:  [98 F (36.7 C)-98.8 F (37.1 C)] 98 F (36.7 C) (10/02 2012) Pulse Rate:  [62-88] 70 (10/02 2200) Resp:  [0-30] 23 (10/02 2000) BP: (149-182)/(35-101) 176/54 mmHg (10/02 2200) SpO2:  [94 %-100 %] 100 % (10/02 2000) Weight:  [166 lb 10.7 oz (75.6 kg)-170 lb (77.111 kg)] 166 lb 10.7 oz (75.6 kg) (10/02 0217)  General - Well nourished, well developed, in no apparent distress.  Ophthalmologic - not cooperative on exam.  Cardiovascular - Regular rate and rhythm with no murmur.  Mental Status -  Level of  arousal and orientation to place, and person were intact, but not to time. Significant psychomotor slowing. Language including expression, repetition, comprehension was assessed and found intact, but naming was impaired.  Cranial Nerves II - XII - II - blinking to visual threat bilaterally. III, IV, VI - move to both sides. V - Facial sensation symmetrical. VII - right facial droop. VIII - Hard of hearing & vestibular intact bilaterally. X - Palate elevates symmetrically. XI - Chin turning & shoulder shrug intact bilaterally. XII - Tongue protrusion intact.  Motor Strength - The patient's strength was  4/5 UEs and 3/5 LEs.  Bulk was normal and fasciculations were absent.   Motor Tone - Muscle tone was assessed at the neck and appendages and was normal.  Reflexes - The patient's reflexes were 1+ in all extremities and she had no pathological reflexes.  Sensory - Light touch, temperature/pinprick were assessed and were normal.    Coordination - not cooperative on exam.  Tremor was absent.  Gait and Station - not tested   ASSESSMENT/PLAN [name] [name] is a 73 y.o. [name] with history of  Multiple previous strokes, diabetes mellitus, hypertension and hyperlipidemia and chronic kidney disease, discharged from [facility] yesterday after having been admitted for acute encephalopathy with hypercarbia and respiratory failure requiring intubation, uncontrolled hypertension and uncontrolled diabetes mellitus. She developed sudden onset of inability to speak. She did not receive IV t-PA due to resolving symptoms. Initial CT imaging confirms no acute abnormality  infarct. MRI pending. TEE in the past and loop recorder so far all negative. Due to significant hx of multiple recurrent strokes in the last 6 months, would consider coumadin treatment.  Stroke vs TIA  MRI  ordered  MRA  ordered  Carotid Doppler  No significant stenosis  2D Echo  unremarkable   TEE in 03/2014 showed PFO, so DVT screening  done which was negative.  clopidogrel 75 mg orally every day prior to admission, now on clopidogrel 75 mg orally every day. I think pt should be put on anticoagulation even we did not find afib on loop recorder but with multiple recurrent stroke at different vascular territory, I recommend coumadin with INR 2-3. But will wait MRI result first. If large stroke, we need to hold off anticoagulation for 10-14 days.   Heparin 5000 units sq tid for VTE prophylaxis  NPO   Loop recorder interrogation requested but her last check last month still has not seen afib yet. (since 03/2014)  Therapy recommendations:  SNF  Ongoing aggressive risk factor management  Disposition:  SNF  Hypertension   Permissive hypertension <220/120 for 24-48 hours and then gradually normalize within 5-7 days  BP goal long term normotensive BP 152-179/53-73 past 24h (08/07/2014 @ 2:38 PM)  Stable  Hyperlipidemia  Home meds:  lipitor 40, resumed in hospital.  LDL 73, goal <70  Continue statin at discharge  Diabetes  HgbA1c 9.2, goal < 7.0  Need DM education again.   Uncontrolled  Other Stroke Risk Factors Advanced age Cigarette smoker, stopped smoking 8 weeks ago. 30 pack-year history ETOH use   Hx stroke/TIA o L BG, L cerebellar, L temporal lobe infarcts, embolic, loop recorder placed May 2015 o left periatrial white matter secondary to small vessel disease March 2015   Other Pertinent History  Thyroid disease  Chronic renal insufficiency  Anemia  Legally blind  PVD  COPD   Hospital day # 1  I, the attending vascular neurologist, have personally obtained a history, examined the patient, evaluated laboratory data, individually viewed imaging studies, and formulated the assessment and plan of care.  I have made any additions or clarifications directly to the above note and agree with the findings and plan as currently documented.    Marvel Plan, MD PhD Stroke Neurology 08/07/2014 11:03  PM    To contact Stroke Continuity provider, please refer to [email or web address]. After hours, contact General Neurology

## 2014-08-07 NOTE — Progress Notes (Addendum)
INITIAL NUTRITION ASSESSMENT  DOCUMENTATION CODES Per approved criteria  -Not Applicable   INTERVENTION: PO diet advancement vs TF initiation.  If TF warranted, recommend Glucerna 1.2 formula -- initiate at 25 ml/hr and increase by 10 ml every 4 hours to goal rate of 65 ml/hr to provide 1872 kcals, 94 gm protein, 1256 ml of free water RD to follow for nutrition care plan  NUTRITION DIAGNOSIS: Inadequate oral intake related to inability to eat, lethargy as evidenced by NPO status  Goal: Pt to meet >/= 90% of their estimated nutrition needs   Monitor:  TF initiation, PO diet advancement, weight, labs, I/O's  Reason for Assessment: Low Braden  73 y.o. female  Admitting Dx: TIA (transient ischemic attack)  ASSESSMENT: 73 y.o. female with PMH of CVA, hypertension, diabetes mellitus, hypothyroidism, recent admission for respiratory failure requiring intubation; presented with complaints of inability to speak and refusing to take her medication.  RD unable to obtain nutrition hx from patient; mittens on and unresponsive; pt seen per Clinical Nutrition during previous hospitalizations; pt with hx of poor PO intake; s/p bedside swallow evaluation today; + severe oral dysphagia; currently NPO; question whether TF initiation will be warranted.   Low braden score places patient at risk for further skin breakdown.  No muscle or subcutaneous fat depletion noticed.  Height: Ht Readings from Last 1 Encounters:  08/07/14 5\' 3"  (1.6 m)    Weight: Wt Readings from Last 1 Encounters:  08/07/14 166 lb 10.7 oz (75.6 kg)    Ideal Body Weight: 115 lb  % Ideal Body Weight: 144%  Wt Readings from Last 10 Encounters:  08/07/14 166 lb 10.7 oz (75.6 kg)  07/30/14 171 lb 1.2 oz (77.6 kg)  07/20/14 188 lb (85.276 kg)  07/16/14 188 lb (85.276 kg)  07/06/14 173 lb (78.472 kg)  06/14/14 182 lb 15.7 oz (83 kg)  05/21/14 161 lb (73.029 kg)  05/03/14 160 lb 11.5 oz (72.9 kg)  03/23/14 178 lb 1.6  oz (80.786 kg)  03/23/14 178 lb 1.6 oz (80.786 kg)    Usual Body Weight: 171 lb   % Usual Body Weight: 97%  BMI:  Body mass index is 29.53 kg/(m^2).  Estimated Nutritional Needs: Kcal: 1700-1900 Protein: 90-100 gm Fluid: 1.7-1.9 L  Skin: Stage II pressure ulcer to sacrum  Diet Order: NPO  EDUCATION NEEDS: -No education needs identified at this time   Intake/Output Summary (Last 24 hours) at 08/07/14 1229 Last data filed at 08/07/14 0400  Gross per 24 hour  Intake      0 ml  Output     25 ml  Net    -25 ml    Labs:   Recent Labs Lab 08/03/14 1010 08/04/14 0330 08/06/14 2230 08/06/14 2245 08/07/14 0726  NA  --  144 144 144 147  K  --  3.4* 4.0 3.9 4.6  CL  --  106 107 110 110  CO2  --  24 27  --  27  BUN  --  17 11 9 11   CREATININE  --  1.33* 1.27* 1.20* 1.28*  CALCIUM  --  8.1* 8.3*  --  8.4  MG 2.0  --   --   --   --   GLUCOSE  --  92 181* 177* 137*    CBG (last 3)   Recent Labs  08/05/14 1311 08/06/14 2301 08/07/14 0258  GLUCAP 114* 158* 149*    Scheduled Meds: . antiseptic oral rinse  7 mL Mouth Rinse  q12n4p  . atorvastatin  40 mg Oral Daily  . budesonide-formoterol  2 puff Inhalation BID  . carvedilol  25 mg Oral BID WC  . chlorhexidine  15 mL Mouth Rinse BID  . cloNIDine  0.3 mg Transdermal Q Fri  . clopidogrel  75 mg Oral Daily  . heparin  5,000 Units Subcutaneous 3 times per day  . hydrALAZINE  100 mg Oral TID  . insulin aspart  0-15 Units Subcutaneous Q6H  . levothyroxine  50 mcg Oral QAC breakfast    Continuous Infusions:   Past Medical History  Diagnosis Date  . Hypertension   . Diabetes mellitus   . Chronic renal insufficiency   . Hyperlipidemia   . Osteopenia   . CVA (cerebral infarction)   . Thyroid disease     Past Surgical History  Procedure Laterality Date  . Eye surgery    . Loop recorder implant  03-24-2014    MDT LinQ implanted by Dr Johney FrameAllred for cryptogenic stroke  . Tee without cardioversion N/A 03/23/2014     Procedure: TRANSESOPHAGEAL ECHOCARDIOGRAM (TEE);  Surgeon: Lars MassonKatarina H Nelson, MD;  [facility];  Service: Cardiovascular;  Laterality: N/A;    Maureen ChattersKatie Tyiesha Brackney, RD, LDN Pager #: [phone] After-Hours Pager #: [phone]

## 2014-08-07 NOTE — ED Notes (Signed)
Attempted report 

## 2014-08-07 NOTE — Progress Notes (Signed)
VASCULAR LAB PRELIMINARY  PRELIMINARY  PRELIMINARY  PRELIMINARY  Carotid duplex, Bilateral lower extremity venous duplex, Bilateral upper extremity venous duplex completed.    Preliminary report:   1.  Carotid:  Bilateral:  1-39% ICA stenosis.  Vertebral artery flow is antegrade.   2.  Lower extremity:  Bilateral:  No evidence of DVT, superficial thrombosis, or Baker's Cyst. 3.  Upper extremity:  Bilateral:  No evidence of DVT or superficial thrombosis.    Sarahanne Novakowski, RVT 08/07/2014, 10:29 AM

## 2014-08-07 NOTE — Progress Notes (Signed)
  Echocardiogram 2D Echocardiogram has been performed.  Draco Malczewski FRANCES 08/07/2014, 12:30 PM

## 2014-08-07 NOTE — Progress Notes (Signed)
[facility] services.  Patient has been engaged [facility] a [facility].  Will ask attending MD to consider a goals of care meeting with the family to determine the goals of care for this patient.  She is currently in under observation and noted to be a full code.  Patient will receive a post discharge transition of care call and will be evaluated for monthly home visits for assessments and disease process education.  Made Inpatient Case Manager aware that [facility] following. Of note, [facility] services does not replace or interfere with any services that are arranged by inpatient case management or social work.  For additional questions or referrals please contact Anibal Hendersonim Henderson BSN RN [facility] Liaison at [phone].

## 2014-08-07 NOTE — Evaluation (Addendum)
Clinical/Bedside Swallow Evaluation Patient Details  Name: [name] FullingBetty J Cooter MRN: [id number] Date of Birth: [birth year]  Today's Date: 08/07/2014 Time: 0817-0830 SLP Time Calculation (min): 13 min  Past Medical History:  Past Medical History  Diagnosis Date  . Hypertension   . Diabetes mellitus   . Chronic renal insufficiency   . Hyperlipidemia   . Osteopenia   . CVA (cerebral infarction)   . Thyroid disease    Past Surgical History:  Past Surgical History  Procedure Laterality Date  . Eye surgery    . Loop recorder implant  03-24-2014    MDT LinQ implanted by Dr Johney FrameAllred for cryptogenic stroke  . Tee without cardioversion N/A 03/23/2014    Procedure: TRANSESOPHAGEAL ECHOCARDIOGRAM (TEE);  Surgeon: Lars MassonKatarina H Nelson, MD;  [facility];  Service: Cardiovascular;  Laterality: N/A;   HPI:  [age] year old NHR with COPD, CVA, CK D., Diabetes admitted from SNF after refusing her medication, was nonverbal, brought in as a code stroke.  [facility] due to [facility] distress.  ST assessed for dysphagia with MBS 9/26 with recommendation of Dys 1 texture/nectar liquids. 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     Assessment / Plan / Recommendation Clinical Impression  Pt. known to this SLP during past several vists.  Increased lethargy than prior admissions; verbalized once  during assessment. Severe oral dysphagia as unable to transit puree bolus to posterior oral cavity due to significant weakness, decreased ROM and decreased awareness given total tactile and verbal assist.  Pt. unable to initiate pharyngeal swallow and required oral suctioning.  Unfortunately her function has declined from previous admission and pt. might benefit from a Palliative Care consult.  Continue NPO recommended.  Will follow for trial therapy, however swallow prognosis appears fair-poor.  Please continue thorough oral care; she is mouth breather.     Aspiration Risk  Severe    Diet Recommendation NPO   Medication Administration: Via alternative means    Other  Recommendations Oral Care Recommendations:  (oral care per protocol)   Follow Up Recommendations  Skilled Nursing facility    Frequency and Duration min 1 x/week  2 weeks   Pertinent Vitals/Pain No pain observed, indicated      Swallow Study           Oral/Motor/Sensory Function Overall Oral Motor/Sensory Function:  (unable)   Ice Chips Ice chips: Not tested   Thin Liquid Thin Liquid: Not tested    Nectar Thick Nectar Thick Liquid: Not tested   Honey Thick Honey Thick Liquid: Not tested   Puree Puree: Impaired Presentation: Spoon Oral Phase Impairments: Reduced labial seal;Reduced lingual movement/coordination;Impaired anterior to posterior transit;Poor awareness of bolus Oral Phase Functional Implications: Oral holding (unable to transit) Pharyngeal Phase Impairments:  (no swallow initiated)   Solid   GO Functional Assessment Tool Used: skilled clinical judgement Functional Limitations: Swallowing Swallow Current Status (N8295(G8996): 100 percent impaired, limited or restricted Swallow Goal Status (A2130(G8997): At least 80 percent but less than 100 percent impaired, limited or restricted  Solid: Not tested       Royce MacadamiaLitaker, Keyante Durio Willis 08/07/2014,8:49 AM    Breck CoonsLisa Willis Lonell FaceLitaker M.Ed [employer] Pager [phone]  08/07/2014

## 2014-08-07 NOTE — Progress Notes (Signed)
Patient seen and evaluated earlier this AM by my associate. Currently patient being evaluated for aphasia. TIA vs Stroke although neurology does not suspect patient has had a stroke and CT scan reports no acute abnormalities.  Undergoing further work up per neurology recommendations. MRI has been ordered.  I will reassess next am.  Penny PiaVEGA, Delrico Minehart

## 2014-08-07 NOTE — Progress Notes (Signed)
Pt received from ED to 2C07. Pt is alert but disorientated x4. Pt is mute but follows commands when asked to squeeze hands. Pt placed on 2L o2 sats in the lower 90s. Stage 2 on pts sacrum, foam pad in place.  IV is saline locked. No family present.

## 2014-08-08 ENCOUNTER — Observation Stay (HOSPITAL_COMMUNITY): Payer: Medicare HMO

## 2014-08-08 DIAGNOSIS — Z7902 Long term (current) use of antithrombotics/antiplatelets: Secondary | ICD-10-CM | POA: Diagnosis not present

## 2014-08-08 DIAGNOSIS — Z66 Do not resuscitate: Secondary | ICD-10-CM | POA: Diagnosis present

## 2014-08-08 DIAGNOSIS — I129 Hypertensive chronic kidney disease with stage 1 through stage 4 chronic kidney disease, or unspecified chronic kidney disease: Secondary | ICD-10-CM | POA: Diagnosis present

## 2014-08-08 DIAGNOSIS — E1165 Type 2 diabetes mellitus with hyperglycemia: Secondary | ICD-10-CM | POA: Diagnosis present

## 2014-08-08 DIAGNOSIS — H548 Legal blindness, as defined in USA: Secondary | ICD-10-CM | POA: Diagnosis present

## 2014-08-08 DIAGNOSIS — M8580 Other specified disorders of bone density and structure, unspecified site: Secondary | ICD-10-CM | POA: Diagnosis present

## 2014-08-08 DIAGNOSIS — E1140 Type 2 diabetes mellitus with diabetic neuropathy, unspecified: Secondary | ICD-10-CM | POA: Diagnosis present

## 2014-08-08 DIAGNOSIS — Z87891 Personal history of nicotine dependence: Secondary | ICD-10-CM | POA: Diagnosis not present

## 2014-08-08 DIAGNOSIS — N183 Chronic kidney disease, stage 3 (moderate): Secondary | ICD-10-CM | POA: Diagnosis present

## 2014-08-08 DIAGNOSIS — I638 Other cerebral infarction: Secondary | ICD-10-CM | POA: Diagnosis present

## 2014-08-08 DIAGNOSIS — E039 Hypothyroidism, unspecified: Secondary | ICD-10-CM | POA: Diagnosis present

## 2014-08-08 DIAGNOSIS — R1314 Dysphagia, pharyngoesophageal phase: Secondary | ICD-10-CM | POA: Diagnosis present

## 2014-08-08 DIAGNOSIS — I739 Peripheral vascular disease, unspecified: Secondary | ICD-10-CM | POA: Diagnosis present

## 2014-08-08 DIAGNOSIS — E1122 Type 2 diabetes mellitus with diabetic chronic kidney disease: Secondary | ICD-10-CM | POA: Diagnosis present

## 2014-08-08 DIAGNOSIS — E870 Hyperosmolality and hypernatremia: Secondary | ICD-10-CM | POA: Diagnosis present

## 2014-08-08 DIAGNOSIS — Z833 Family history of diabetes mellitus: Secondary | ICD-10-CM | POA: Diagnosis not present

## 2014-08-08 DIAGNOSIS — Z8249 Family history of ischemic heart disease and other diseases of the circulatory system: Secondary | ICD-10-CM | POA: Diagnosis not present

## 2014-08-08 DIAGNOSIS — D649 Anemia, unspecified: Secondary | ICD-10-CM | POA: Diagnosis present

## 2014-08-08 DIAGNOSIS — R29810 Facial weakness: Secondary | ICD-10-CM | POA: Diagnosis present

## 2014-08-08 DIAGNOSIS — Z794 Long term (current) use of insulin: Secondary | ICD-10-CM | POA: Diagnosis not present

## 2014-08-08 DIAGNOSIS — I69354 Hemiplegia and hemiparesis following cerebral infarction affecting left non-dominant side: Secondary | ICD-10-CM | POA: Diagnosis not present

## 2014-08-08 DIAGNOSIS — E785 Hyperlipidemia, unspecified: Secondary | ICD-10-CM | POA: Diagnosis present

## 2014-08-08 DIAGNOSIS — R4701 Aphasia: Secondary | ICD-10-CM | POA: Diagnosis present

## 2014-08-08 LAB — C3 COMPLEMENT: C3 COMPLEMENT: 124 mg/dL (ref 90–180)

## 2014-08-08 LAB — GLUCOSE, CAPILLARY
GLUCOSE-CAPILLARY: 172 mg/dL — AB (ref 70–99)
Glucose-Capillary: 118 mg/dL — ABNORMAL HIGH (ref 70–99)

## 2014-08-08 LAB — SICKLE CELL SCREEN: Sickle Cell Screen: NEGATIVE

## 2014-08-08 LAB — C4 COMPLEMENT: COMPLEMENT C4, BODY FLUID: 40 mg/dL (ref 10–40)

## 2014-08-08 MED ORDER — INSULIN ASPART 100 UNIT/ML ~~LOC~~ SOLN
0.0000 [IU] | Freq: Three times a day (TID) | SUBCUTANEOUS | Status: DC
  Administered 2014-08-09: 2 [IU] via SUBCUTANEOUS
  Administered 2014-08-09: 1 [IU] via SUBCUTANEOUS
  Administered 2014-08-09: 3 [IU] via SUBCUTANEOUS
  Administered 2014-08-10: 1 [IU] via SUBCUTANEOUS
  Administered 2014-08-10: 5 [IU] via SUBCUTANEOUS
  Administered 2014-08-10 – 2014-08-11 (×2): 1 [IU] via SUBCUTANEOUS
  Administered 2014-08-11: 3 [IU] via SUBCUTANEOUS
  Administered 2014-08-11 – 2014-08-12 (×2): 1 [IU] via SUBCUTANEOUS
  Administered 2014-08-12: 2 [IU] via SUBCUTANEOUS
  Administered 2014-08-14 – 2014-08-15 (×2): 3 [IU] via SUBCUTANEOUS

## 2014-08-08 MED ORDER — METOPROLOL TARTRATE 1 MG/ML IV SOLN
5.0000 mg | Freq: Once | INTRAVENOUS | Status: AC
  Administered 2014-08-08: 5 mg via INTRAVENOUS
  Filled 2014-08-08: qty 5

## 2014-08-08 MED ORDER — HYDRALAZINE HCL 20 MG/ML IJ SOLN
10.0000 mg | INTRAMUSCULAR | Status: DC | PRN
  Administered 2014-08-08 – 2014-08-13 (×3): 10 mg via INTRAVENOUS
  Filled 2014-08-08 (×2): qty 1

## 2014-08-08 MED ORDER — ASPIRIN-DIPYRIDAMOLE ER 25-200 MG PO CP12
1.0000 | ORAL_CAPSULE | Freq: Two times a day (BID) | ORAL | Status: DC
  Administered 2014-08-15: 1 via ORAL
  Filled 2014-08-08 (×6): qty 1

## 2014-08-08 NOTE — Evaluation (Addendum)
Speech Language Pathology Evaluation Patient Details Name: [name] [name] MRN: [id number] DOB: 1941/03/12 Today's Date: 08/08/2014 Time: 6578-4696 SLP Time Calculation (min): 6 min  Problem List:  Patient Active Problem List   Diagnosis Date Noted  . Aphasia 08/07/2014  . COPD (chronic obstructive pulmonary disease) 07/24/2014  . Type II or unspecified type diabetes mellitus with renal manifestations, not stated as uncontrolled 07/17/2014  . Unspecified late effects of cerebrovascular disease 07/17/2014  . Diabetic neuropathy 07/17/2014  . Gastroesophageal reflux disease without esophagitis 07/16/2014  . Edema 07/14/2014  . Hypothyroidism 06/22/2014  . 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 Urinary retention 06/18/2014  . Acute respiratory failure with hypoxia and hypercapnia 06/17/2014  . COPD with acute exacerbation 06/17/2014  . Type II or unspecified type diabetes mellitus with unspecified complication, uncontrolled 06/15/2014  . Respiratory acidosis 06/12/2014  . Encephalopathy acute 06/12/2014  . Dysphagia, unspecified(787.20) 05/05/2014  . Cough 05/05/2014  . Anemia 05/05/2014  . Acute CVA (cerebrovascular accident) 04/25/2014  . Acute on chronic renal failure 04/24/2014  . Metabolic encephalopathy 04/24/2014  . Dehydration 04/24/2014  . Patient non adherence 04/24/2014  . CKD (chronic kidney disease), stage III 03/21/2014  . History of CVA (cerebrovascular accident) 01/10/2014  . DM type 2, uncontrolled, with renal complications 01/10/2014  . Hypertension   . Hyperlipidemia   . Peripheral vascular disease, unspecified 05/13/2013   Past Medical History:  Past Medical History  Diagnosis Date  . Hypertension   . Diabetes mellitus   . Chronic renal insufficiency   . Hyperlipidemia   . Osteopenia   . CVA (cerebral infarction)   . Thyroid disease    Past Surgical History:  Past Surgical History  Procedure Laterality Date  . Eye surgery    .  Loop recorder implant  03-24-2014    MDT LinQ implanted by Dr Johney Frame for cryptogenic stroke  . Tee without cardioversion N/A 03/23/2014    Procedure: TRANSESOPHAGEAL ECHOCARDIOGRAM (TEE);  Surgeon: Lars Masson, MD;  Location: [facility] ENDOSCOPY;  Service: Cardiovascular;  Laterality: N/A;   HPI:  [name] with COPD, CVA, CK D., Diabetes admitted from SNF after refusing her medication, was nonverbal, brought in as a code stroke.  [facility] 08/[facility]/14 due to respiratory distress.  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     Assessment / Plan / Recommendation Clinical Impression  SLP familiar to pt. with dysphagia intervention.  Baseline fucntion included conversation in short phrases; cognitive impairments (baseline) evident in the areas of information processing, memory and problem solving.  She has suffered an acute left CVA and was unable to vocalize during today's language-cognitve evaluation.  Pt. lethargic, arousable with max and repeated verbal and tactile stimuli.  Unable to vocalize during assessment, however pt. worked with PT prior to SLP assessment and verbalized in short phrases.  Basic comprehension to one step commands as she followed 3/3 one step directives and attempting to respond using thumbs up/down on left hand.  SLP recommends trial therapy for facilitation of basic communication although pt. has declined significantly.  Agree with Palliative care consult as she is also unable to consume po's at this time.    SLP Assessment  Patient needs continued Speech Lanaguage Pathology Services    Follow Up Recommendations  Skilled Nursing facility    Frequency and Duration min 1 x/week  2 weeks   Pertinent Vitals/Pain Pain  Assessment:  (no overt indications, difficult to accurately assess)   SLP Goals  Potential to Achieve Goals: Fair Potential  Considerations: Ability to learn/carryover information;Medical prognosis;Severity of impairments  SLP Evaluation Prior Functioning  Cognitive/Linguistic Baseline: Baseline deficits Baseline deficit details:  (memory, delayed processing, dysarthria) Type of Home: House  Lives With: Spouse Available Help at Discharge: Available 24 hours/day;Family   Cognition  Overall Cognitive Status: Impaired/Different from baseline Arousal/Alertness: Lethargic (arousable with cues) Orientation Level: Oriented to person;Oriented to place Attention: Sustained Sustained Attention: Impaired Sustained Attention Impairment: Functional basic Memory:  (baseline impairments) Awareness: Impaired Awareness Impairment: Anticipatory impairment;Emergent impairment (unable to verbalize ) Problem Solving: Impaired Problem Solving Impairment: Functional basic Safety/Judgment: Impaired    Comprehension  Auditory Comprehension Overall Auditory Comprehension: Impaired Yes/No Questions:  (one/one accurate) Commands:  (functional for 3/3 one step basic) Conversation:  (N/A) Visual Recognition/Discrimination Discrimination: Not tested    Expression Expression Primary Mode of Expression:  (unable to vocalize, answers some basic ?'s with thumb moveme) Verbal Expression Overall Verbal Expression:  (N/A) Written Expression Dominant Hand: Right Written Expression: Not tested   Oral / Motor Oral Motor/Sensory Function Overall Oral Motor/Sensory Function: Impaired Labial ROM: Reduced right;Reduced left Labial Symmetry: Abnormal symmetry right Labial Strength: Reduced Lingual ROM: Reduced right;Reduced left Lingual Strength: Reduced Facial ROM: Reduced right;Reduced left Facial Strength: Reduced Motor Speech Overall Motor Speech:  (N/A) Respiration: Within functional limits Phonation:  (N/A)   GO     Royce MacadamiaLitaker, Bell Cai Willis 08/08/2014, 4:12 PM   Breck CoonsLisa Willis Shanley Furlough M.Ed [employer] Pager [phone]

## 2014-08-08 NOTE — Progress Notes (Signed)
08/08/2014- Respiratory care note- Pt unable to perform maneuver required for delivery of MDI at this time.

## 2014-08-08 NOTE — Evaluation (Signed)
Physical Therapy Evaluation Patient Details Name: [name] [name] MRN: [id number] DOB: 1941/11/03 Today's Date: 08/08/2014   History of Present Illness  [name] [name] is a 73 y.o. female with Past medical history of CVA, hypertension, diabetes mellitus, hypothyroidism, recent admission for respiratory failure requiring intubation. The patient is presenting with complaints of inability to speak.Patient was refusing her medication and was nonverbal at [facility] and therefore she was brought in as a code stroke. Neurology evaluated the patient and canceled the code stroke and recommended medicine admission. 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   Clinical Impression  Pt admitted with new CVA. Pt currently with functional limitations due to the deficits listed below (see PT Problem List). Pt transferred bed to chair with +2 mod A. Pt did answer simple questions with short answers and participated in mobility on eval.  Pt will benefit from skilled PT to increase their independence and safety with mobility to allow discharge to the venue listed below. PT will continue to follow.       Follow Up Recommendations SNF;Supervision/Assistance - 24 hour    Equipment Recommendations  None recommended by PT    Recommendations for Other Services       Precautions / Restrictions Precautions Precautions: Fall Restrictions Weight Bearing Restrictions: No      Mobility  Bed Mobility Overal bed mobility: Needs Assistance;+2 for physical assistance Bed Mobility: Rolling;Sidelying to Sit Rolling: +2 for physical assistance;Mod assist Sidelying to sit: +2 for physical assistance;Mod assist       General bed mobility comments: pt initiated rolling but required mod A to achieve SL position, mod A needed to get legs off bed and elevate trunk from mattress.   Transfers Overall transfer level: Needs assistance Equipment used: None Transfers: Sit to/from [employer] Sit to Stand: Mod assist;+2 physical assistance Stand pivot transfers: Mod assist;+2 physical assistance       General transfer comment: pt able to step feet to chair from bed. Mod A needed for power up from sitting as well as to maintain standing, pt very unteady and legs weak. Difficulty stepping feet back all the way to chair. Unable to scoot self back into chair once sitting.  Ambulation/Gait                Stairs            Wheelchair Mobility    Modified Rankin (Stroke Patients Only) Modified Rankin (Stroke Patients Only) Pre-Morbid Rankin Score: Moderately severe disability Modified Rankin: Severe disability     Balance Overall balance assessment: Needs assistance Sitting-balance support: Bilateral upper extremity supported;Feet supported Sitting balance-Leahy Scale: Poor Sitting balance - Comments: assist EOB varied from min-guard to mod, improved with time. Maintained a slight posterior lean. Needed UE's for support Postural control: Posterior lean Standing balance support: Bilateral upper extremity supported;During functional activity Standing balance-Leahy Scale: Poor                               Pertinent Vitals/Pain Pain Assessment: No/denies pain    Home Living Family/patient expects to be discharged to:: Skilled nursing facility Living Arrangements: Spouse/significant other;Children Available Help at Discharge: Available 24 hours/day;Family Type of Home: House Home Access: Ramped entrance     Home Layout: One level Home Equipment: Walker - 2 wheels;Tub bench;Bedside commode Additional Comments: pt went to [facility] on 9/30 after respiratory failure, returned on 10/2 with new CVA  Prior Function Level of Independence: Needs assistance   Gait / Transfers Assistance Needed: Assist to transfer and for ambulation with RW  ADL's / Homemaking Assistance Needed: Assist for bathing/dressing, housekeeping, and meal prep.    Comments: Patient legally blind     Hand Dominance   Dominant Hand: Right    Extremity/Trunk Assessment   Upper Extremity Assessment: Defer to OT evaluation RUE Deficits / Details: difficulty lifting bilateral arms         Lower Extremity Assessment: Generalized weakness;RLE deficits/detail;LLE deficits/detail;Difficult to assess due to impaired cognition RLE Deficits / Details: pt needed assistance moving LE's against gravity but able to extend bilateral knees 3/5 quad LLE Deficits / Details: bilateral LE's with generalized weakness, difficult to discern difference right to left with pt's mental status  Cervical / Trunk Assessment: Kyphotic  Communication   Communication: Expressive difficulties;HOH  Cognition Arousal/Alertness: Lethargic Behavior During Therapy: Flat affect Overall Cognitive Status: History of cognitive impairments - at baseline   Orientation Level: Disoriented to;Time     Following Commands: Follows one step commands consistently       General Comments: pt appropriate during eval and answered questions with short answers. Poor historian and verbalization minimal so most infio obtained from chart    General Comments      Exercises        Assessment/Plan    PT Assessment Patient needs continued PT services  PT Diagnosis Generalized weakness;Difficulty walking   PT Problem List Decreased strength;Decreased range of motion;Decreased activity tolerance;Decreased balance;Decreased mobility;Decreased coordination;Decreased cognition;Decreased safety awareness;Decreased knowledge of precautions  PT Treatment Interventions DME instruction;Gait training;Functional mobility training;Therapeutic activities;Therapeutic exercise;Balance training;Neuromuscular re-education;Cognitive remediation;Patient/family education   PT Goals (Current goals can be found in the Care Plan section) Acute Rehab PT Goals Patient Stated Goal: did not state PT Goal  Formulation: With patient Time For Goal Achievement: 08/22/14 Potential to Achieve Goals: Fair    Frequency Min 3X/week   Barriers to discharge        Co-evaluation               End of Session Equipment Utilized During Treatment: Gait belt Activity Tolerance: Patient tolerated treatment well Patient left: in chair;with call bell/phone within reach Nurse Communication: Mobility status         Time: [id number] PT Time Calculation (min): 16 min   Charges:   PT Evaluation $Initial PT Evaluation Tier I: 1 Procedure PT Treatments $Therapeutic Activity: 8-22 mins   PT G Codes:        Lyanne CoVictoria Anand Tejada, PT  Acute Rehab Services  [phone]   Lyanne CoManess, Husein Guedes 08/08/2014, 3:45 PM

## 2014-08-08 NOTE — Progress Notes (Signed)
Pt BP 194/58 after hydralazine 10 mg given as a prn. Notified M.Lynch NP of pt BP will continue to monitor pt. Pt pulling at lines as well. Will continue to monitor BP. MD aware of pt continuous Hypertensive state.

## 2014-08-08 NOTE — Progress Notes (Signed)
[facility] PROGRESS NOTE  [name] [name] ZOX:[id number] DOB: 1940/12/11 DOA: 08/06/2014 PCP: Alva Garnet., MD  Assessment/Plan: 1. Acute CVA - Management per neurology - MRI of brain reported Acute left corpus callosum infarct - Pt failed swallow evaluation - consulting palliative care team for goals of care.  Active Problems:   DM type 2, uncontrolled, with renal complications - placed on MIVF's - cbg q 4 hour while npo, continue SSI    CKD (chronic kidney disease), stage III - stable    Hypothyroidism - May have to change to IV form should patient continue npo status    COPD (chronic obstructive pulmonary disease) - stable, no wheezes   Code Status: full Family Communication: dicussed with son who is agreeable to have palliative care contact him for goals of care Disposition Plan: as listed above, pending final recommendations from neurologist   Consultants:  neurologist  Procedures:  Please see EMR  Antibiotics:  None  HPI/Subjective: Pt has no meaningful interaction with me.  Stares up at the ceiling when aroused   Objective: Filed Vitals:   08/08/14 1156  BP: 184/62  Pulse: 79  Temp: 97.9 F (36.6 C)  Resp: 20    Intake/Output Summary (Last 24 hours) at 08/08/14 1227 Last data filed at 08/08/14 1000  Gross per 24 hour  Intake   1125 ml  Output      0 ml  Net   1125 ml   Filed Weights   08/06/14 2248 08/07/14 0217 08/08/14 0358  Weight: 77.111 kg (170 lb) 75.6 kg (166 lb 10.7 oz) 76 kg (167 lb 8.8 oz)    Exam:   General:  Pt in NAD, awake, does not interact with examiner  Cardiovascular: rrr, no murmurs  Respiratory: cta bl , no wheezes  Abdomen: ND, soft  Musculoskeletal: no cyanosis   Data Reviewed: Basic Metabolic Panel:  Recent Labs Lab 08/02/14 0300 08/02/14 1930 08/03/14 0858 08/03/14 1010 08/04/14 0330 08/06/14 2230 08/06/14 2245 08/07/14 0726  NA 139  --  143  --  144 144 144 147  K 3.3*  --  3.1*   --  3.4* 4.0 3.9 4.6  CL 99  --  104  --  106 107 110 110  CO2 26  --  29  --  24 27  --  27  GLUCOSE 220*  --  163*  --  92 181* 177* 137*  BUN 19  --  17  --  17 11 9 11   CREATININE 1.28*  --  1.33*  --  1.33* 1.27* 1.20* 1.28*  CALCIUM 8.2* 8.5 8.1*  --  8.1* 8.3*  --  8.4  MG  --   --   --  2.0  --   --   --   --    Liver Function Tests:  Recent Labs Lab 08/04/14 0330 08/06/14 2230 08/07/14 0726  AST 40* 21 20  ALT 29 16 16   ALKPHOS 71 67 68  BILITOT <0.2* 0.2* <0.2*  PROT 5.8* 5.9* 5.9*  ALBUMIN 2.3* 2.3* 2.3*   No results found for this basename: LIPASE, AMYLASE,  in the last 168 hours  Recent Labs Lab 08/03/14 1010  AMMONIA 21   CBC:  Recent Labs Lab 08/02/14 0300 08/04/14 0330 08/06/14 2230 08/06/14 2245 08/07/14 0726  WBC 7.3 6.0 9.4  --  9.6  NEUTROABS  --   --  7.0  --  6.7  HGB 10.5* 9.6* 9.1* 9.2* 9.5*  HCT 31.1* 29.1*  28.0* 27.0* 29.8*  MCV 87.6 88.7 90.6  --  93.7  PLT 261 300 290  --  298   Cardiac Enzymes: No results found for this basename: CKTOTAL, CKMB, CKMBINDEX, TROPONINI,  in the last 168 hours BNP (last 3 results)  Recent Labs  06/09/14 1344 06/12/14 1330 07/24/14 1000  PROBNP 1271.0* 1666.0* 6064.0*   CBG:  Recent Labs Lab 08/07/14 0750 08/07/14 1531 08/07/14 1649 08/07/14 2244 08/08/14 0421  GLUCAP 114* 67* 97 139* 118*    No results found for this or any previous visit (from the past 240 hour(s)).   Studies: Ct Head Wo Contrast  08/06/2014   CLINICAL DATA:  Code stroke.  Facial droop.  EXAM: CT HEAD WITHOUT CONTRAST  TECHNIQUE: Contiguous axial images were obtained from the base of the skull through the vertex without intravenous contrast.  COMPARISON:  08/04/2014  FINDINGS: Diffuse cerebral atrophy. Ventricular dilatation consistent with central atrophy. Low-attenuation changes throughout the deep white matter consistent with small vessel ischemia. Old lacunar infarcts in the right caudate lobe, left thalamus, and  left cerebellum. No change since previous study. No mass effect or midline shift. No abnormal extra-axial fluid collections. Gray-white matter junctions are distinct. Basal cisterns are not effaced. No evidence of acute intracranial hemorrhage. No depressed skull fractures. Small retention cyst in the sphenoid sinus. Mastoid air cells are not opacified.  IMPRESSION: No acute intracranial abnormalities. Chronic atrophy and small vessel ischemic changes with multiple old lacunar infarcts.  These results were called by telephone at the time of interpretation on 08/06/2014 at 10:52 pm to Dr. Vanetta Mulders , who verbally acknowledged these results.   Electronically Signed   By: Burman Nieves M.D.   On: 08/06/2014 22:54   Mr Maxine Glenn Head Wo Contrast  08/08/2014   CLINICAL DATA:  Sudden onset of inability to speak beginning yesterday. Symptoms have now resolved. Stroke risk factors include previous stroke, diabetes, hypertension, hyperlipidemia, and chronic renal disease.  EXAM: MRI HEAD WITHOUT CONTRAST  MRA HEAD WITHOUT CONTRAST  TECHNIQUE: Multiplanar, multiecho pulse sequences of the brain and surrounding structures were obtained without intravenous contrast. Angiographic images of the head were obtained using MRA technique without contrast.  COMPARISON:  CT head 08/06/2014.  MR head 04/24/14  FINDINGS: MRI HEAD FINDINGS  Subcentimeter acute infarct in the LEFT corpus callosum just lateral and superior to the genu. This lies within the LEFT ACA territory. No other areas of acute infarction are identified. No hemorrhage, mass lesion, or extra-axial fluid.  Hydrocephalus ex vacuo. Extensive small vessel disease throughout the periventricular and subcortical white matter. Widespread remote basal ganglia, thalamic, and deep white matter infarcts some of which were acute on the previous study from June. Partial empty sella. Cervical spondylosis. No osseous lesions. BILATERAL cataract extraction. Chronic sinus and mastoid  fluid.  MRA HEAD FINDINGS  Misregistration of the process images precludes accurate stenosis evaluation. There is gross patency of the internal carotid arteries and basilar artery with RIGHT vertebral dominant. Suspected moderate RIGHT cavernous ICA disease and mild LEFT cavernous ICA disease suspected mild disease of the distal LEFT vertebral. There is no proximal flow reducing lesion of the anterior, middle, or posterior cerebral arteries. Both distal anterior cerebral arteries appear patent.  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  Advanced atrophy, small vessel disease, and widespread remote deep nuclei and deep white matter infarcts.  Patient motion results in misregistration of the MRA exam. Gross patency of the intracranial vasculature, including  both anterior cerebral arteries, is established however.   Electronically Signed   By: Davonna BellingJohn  Curnes M.D.   On: 08/08/2014 09:54   Mr Brain Wo Contrast  08/08/2014   CLINICAL DATA:  Sudden onset of inability to speak beginning yesterday. Symptoms have now resolved. Stroke risk factors include previous stroke, diabetes, hypertension, hyperlipidemia, and chronic renal disease.  EXAM: MRI HEAD WITHOUT CONTRAST  MRA HEAD WITHOUT CONTRAST  TECHNIQUE: Multiplanar, multiecho pulse sequences of the brain and surrounding structures were obtained without intravenous contrast. Angiographic images of the head were obtained using MRA technique without contrast.  COMPARISON:  CT head 08/06/2014.  MR head 04/24/14  FINDINGS: MRI HEAD FINDINGS  Subcentimeter acute infarct in the LEFT corpus callosum just lateral and superior to the genu. This lies within the LEFT ACA territory. No other areas of acute infarction are identified. No hemorrhage, mass lesion, or extra-axial fluid.  Hydrocephalus ex vacuo. Extensive small vessel disease throughout the periventricular and subcortical white matter. Widespread remote basal ganglia, thalamic,  and deep white matter infarcts some of which were acute on the previous study from June. Partial empty sella. Cervical spondylosis. No osseous lesions. BILATERAL cataract extraction. Chronic sinus and mastoid fluid.  MRA HEAD FINDINGS  Misregistration of the process images precludes accurate stenosis evaluation. There is gross patency of the internal carotid arteries and basilar artery with RIGHT vertebral dominant. Suspected moderate RIGHT cavernous ICA disease and mild LEFT cavernous ICA disease suspected mild disease of the distal LEFT vertebral. There is no proximal flow reducing lesion of the anterior, middle, or posterior cerebral arteries. Both distal anterior cerebral arteries appear patent.  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  Advanced atrophy, small vessel disease, and widespread remote deep nuclei and deep white matter infarcts.  Patient motion results in misregistration of the MRA exam. Gross patency of the intracranial vasculature, including both anterior cerebral arteries, is established however.   Electronically Signed   By: Davonna BellingJohn  Curnes M.D.   On: 08/08/2014 09:54   Dg Chest Port 1 View  08/07/2014   CLINICAL DATA:  Short of breath. Labored in shallow breathing. Unresponsive.  EXAM: PORTABLE CHEST - 1 VIEW  COMPARISON:  07/29/2014  FINDINGS: Probable linear atelectasis in the left mid lung. Normal heart size and pulmonary vascularity. No focal airspace disease in the lungs. No blunting of costophrenic angles. No pneumothorax. Mediastinal contours appear intact.  IMPRESSION: No active disease.   Electronically Signed   By: Burman NievesWilliam  Stevens M.D.   On: 08/07/2014 00:02    Scheduled Meds: . antiseptic oral rinse  7 mL Mouth Rinse q12n4p  . atorvastatin  40 mg Oral Daily  . budesonide-formoterol  2 puff Inhalation BID  . carvedilol  25 mg Oral BID WC  . chlorhexidine  15 mL Mouth Rinse BID  . cloNIDine  0.3 mg Transdermal Q Fri  . clopidogrel   75 mg Oral Daily  . heparin  5,000 Units Subcutaneous 3 times per day  . hydrALAZINE  100 mg Oral TID  . insulin aspart  0-15 Units Subcutaneous Q6H  . levothyroxine  50 mcg Oral QAC breakfast   Continuous Infusions: . dextrose 5 % and 0.45 % NaCl with KCl 20 mEq/L 75 mL/hr at 08/08/14 0550       Time spent: > 35 minutes    Penny PiaVEGA, Sevin Langenbach  [facility] Pager [phone]  If 7PM-7AM, please contact night-coverage at [email or web address], password [email or web address] 08/08/2014, 12:27 PM  LOS: 2 days

## 2014-08-08 NOTE — Progress Notes (Signed)
[facility] express transmission is reviewed by me.  Pt has a MDT LINQ implantable loop recorder implanted for cryptogenic stroke indication.  Interrogation reveals sinus rhythm with PACs.  There are episodes determined by the device to be AF however I have personally reviewed these and they are sinus with PACs.  I do not see any arrhythmias to correlate to the patient's current stroke.  Electrophysiology team to see as needed while here. Please call with questions.

## 2014-08-08 NOTE — Progress Notes (Signed)
STROKE TEAM PROGRESS NOTE   HISTORY [name] [name] is an 73 y.o. female history of previous strokes, diabetes mellitus, hypertension and hyperlipidemia as well as chronic kidney disease and discharged from [facility] yesterday, having been admitted for acute encephalopathy with hypercarbia and respiratory failure requiring intubation, uncontrolled hypertension and uncontrolled diabetes mellitus. She experienced sudden onset of inability to speak at 5:30 PM today 08/06/2014. No focal weakness was noted. She had no breathing difficulty. There was no change in consciousness. Patient arrived in the emergency room and code stroke status. CT scan of her head showed no acute intracranial abnormality. She does be on time window for treatment intervention with TPA, a.m. she had begun to speak after arriving in the emergency room. Patient was not administered TPA secondary to resolving symptoms.  She was admitted for further evaluation and treatment.   SUBJECTIVE (INTERVAL HISTORY) No one is at the bedside.  Overall she feels her condition is stable. She had multiple stroke in the past, including 01/2014 at left parietal WM, 03/2014 at left BG, left cerebellar, left temporal lobe as well as 04/2014 at right caudate head. She had TEE in 03/2014 which was negative and loop recorder placed at the end of 2015 which until today has not showed any afib episodes. Dr Johney Frame looked at LOOP and felt afib episodes noted were artefact   OBJECTIVE Temp:  [97.9 F (36.6 C)-98.5 F (36.9 C)] 97.9 F (36.6 C) (10/03 1156) Pulse Rate:  [70-88] 79 (10/03 1156) Cardiac Rhythm:  [-]  Resp:  [0-27] 20 (10/03 1156) BP: (165-184)/(50-78) 184/62 mmHg (10/03 1156) SpO2:  [98 %-100 %] 100 % (10/03 1156) Weight:  [167 lb 8.8 oz (76 kg)] 167 lb 8.8 oz (76 kg) (10/03 0358)   Recent Labs Lab 08/07/14 0750 08/07/14 1531 08/07/14 1649 08/07/14 2244 08/08/14 0421  GLUCAP 114* 67* 97 139* 118*    Recent Labs Lab 08/02/14 0300   08/03/14 0858 08/03/14 1010 08/04/14 0330 08/06/14 2230 08/06/14 2245 08/07/14 0726  NA 139  --  143  --  144 144 144 147  K 3.3*  --  3.1*  --  3.4* 4.0 3.9 4.6  CL 99  --  104  --  106 107 110 110  CO2 26  --  29  --  24 27  --  27  GLUCOSE 220*  --  163*  --  92 181* 177* 137*  BUN 19  --  17  --  17 11 9 11   CREATININE 1.28*  --  1.33*  --  1.33* 1.27* 1.20* 1.28*  CALCIUM 8.2*  < > 8.1*  --  8.1* 8.3*  --  8.4  MG  --   --   --  2.0  --   --   --   --   < > = values in this interval not displayed.  Recent Labs Lab 08/04/14 0330 08/06/14 2230 08/07/14 0726  AST 40* 21 20  ALT 29 16 16   ALKPHOS 71 67 68  BILITOT <0.2* 0.2* <0.2*  PROT 5.8* 5.9* 5.9*  ALBUMIN 2.3* 2.3* 2.3*    Recent Labs Lab 08/02/14 0300 08/04/14 0330 08/06/14 2230 08/06/14 2245 08/07/14 0726  WBC 7.3 6.0 9.4  --  9.6  NEUTROABS  --   --  7.0  --  6.7  HGB 10.5* 9.6* 9.1* 9.2* 9.5*  HCT 31.1* 29.1* 28.0* 27.0* 29.8*  MCV 87.6 88.7 90.6  --  93.7  PLT 261 300 290  --  298   No results found for this basename: CKTOTAL, CKMB, CKMBINDEX, TROPONINI,  in the last 168 hours  Recent Labs  08/06/14 2230 08/07/14 0726  LABPROT 14.3 14.9  INR 1.11 1.17    Recent Labs  08/06/14 2317  COLORURINE YELLOW  LABSPEC 1.017  PHURINE 5.0  GLUCOSEU NEGATIVE  HGBUR NEGATIVE  BILIRUBINUR NEGATIVE  KETONESUR NEGATIVE  PROTEINUR 100*  UROBILINOGEN 0.2  NITRITE NEGATIVE  LEUKOCYTESUR SMALL*       Component Value Date/Time   CHOL 150 08/07/2014 0324   TRIG 101 08/07/2014 0324   HDL 57 08/07/2014 0324   CHOLHDL 2.6 08/07/2014 0324   VLDL 20 08/07/2014 0324   LDLCALC 73 08/07/2014 0324   Lab Results  Component Value Date   HGBA1C 9.0* 08/07/2014      Component Value Date/Time   LABOPIA NONE DETECTED 08/06/2014 2317   COCAINSCRNUR NONE DETECTED 08/06/2014 2317   LABBENZ NONE DETECTED 08/06/2014 2317   AMPHETMU NONE DETECTED 08/06/2014 2317   THCU NONE DETECTED 08/06/2014 2317   LABBARB NONE DETECTED  08/06/2014 2317     Recent Labs Lab 08/06/14 2230  ETH <11    Ct Head Wo Contrast 08/06/2014    No acute intracranial abnormalities. Chronic atrophy and small vessel ischemic changes with multiple old lacunar infarcts.   08/04/2014 Mild diffuse cortical atrophy. Moderate chronic ischemic white matter disease. No acute intracranial abnormality seen.  Dg Chest Port 1 View 08/07/2014   No active disease.     MRI-Acute LEFT corpus callosum infarct just superior and lateral to the  genu. Advanced atrophy, small vessel disease, and widespread remote deep  nuclei and deep white matter infarcts.   MRA  Patient motion results in misregistration of the MRA exam. Gross  patency of the intracranial vasculature, including both anterior  cerebral arteries, is established however  Carotid Doppler  No evidence of hemodynamically significant internal carotid artery stenosis. Vertebral artery flow is antegrade.   Lower extremity Venous Doppler  Bilateral: No evidence of DVT, superficial thrombosis, or Baker's Cyst.   Upper extremity venous doppler  Bilateral: No evidence of DVT or superficial thrombosis.  2D echo  - Left ventricle: The cavity size was normal. Wall thickness was normal. Systolic function was vigorous. The estimated ejection fraction was in the range of 65% to 70%. Doppler parameters are consistent with abnormal left ventricular relaxation (grade 1 diastolic dysfunction). Doppler parameters are consistent with elevated mean left atrial filling pressure. - Tricuspid valve: There was mild-moderate regurgitation. - Pulmonary arteries: PA peak pressure: 53 mm Hg (S). - Pericardium, extracardiac: A trivial pericardial effusion was identified.  Hypercoagulable and autoimmune work up - pending  PHYSICAL EXAM  Temp:  [97.9 F (36.6 C)-98.5 F (36.9 C)] 97.9 F (36.6 C) (10/03 1156) Pulse Rate:  [70-88] 79 (10/03 1156) Resp:  [0-27] 20 (10/03 1156) BP: (165-184)/(50-78) 184/62 mmHg  (10/03 1156) SpO2:  [98 %-100 %] 100 % (10/03 1156) Weight:  [167 lb 8.8 oz (76 kg)] 167 lb 8.8 oz (76 kg) (10/03 0358)  General - Well nourished, well developed, in no apparent distress.  Ophthalmologic - not cooperative on exam.  Cardiovascular - Regular rate and rhythm with no murmur.  Mental Status -  Level of arousal and orientation to place, and person were intact, but not to time. Significant psychomotor slowing. Language including expression, repetition, comprehension was assessed and found  impaired.  Cranial Nerves II - XII - II - blinking to visual threat bilaterally. III, IV, VI -  move to both sides. V - Facial sensation symmetrical. VII - right facial droop. VIII - Hard of hearing & vestibular intact bilaterally. X - Palate elevates symmetrically. XI - Chin turning & shoulder shrug intact bilaterally. XII - Tongue protrusion intact.  Motor Strength - The patient's strength was 4/5 UEs and 3/5 LEs.  Bulk was normal and fasciculations were absent.   Motor Tone - Muscle tone was   normal.  Reflexes - The patient's reflexes were 1+ in all extremities and she had no pathological reflexes.  Sensory - Light touch, temperature/pinprick were assessed and were normal.    Coordination - not cooperative on exam.  Tremor was absent.  Gait and Station - not tested   ASSESSMENT/PLAN [name] [name] is a 73 y.o. female with history of  Multiple previous strokes, diabetes mellitus, hypertension and hyperlipidemia and chronic kidney disease, discharged from [facility]  Few days ago after having been admitted for acute encephalopathy with hypercarbia and respiratory failure requiring intubation, uncontrolled hypertension and uncontrolled diabetes mellitus. She developed sudden onset of inability to speak. She did not receive IV t-PA due to resolving symptoms. Initial CT imaging confirms no acute abnormality  infarct. MRI pending. TEE in the past and loop recorder so far all negative.   Small left corpus callosal new infarct etiology cryptogenic MRI  Acute LEFT corpus callosum infarct just superior and lateral to the genu  MRA no large vessel occlusion  Carotid Doppler  No significant stenosis  2D Echo  unremarkable   TEE in 03/2014 showed PFO, so DVT screening done which was negative.  clopidogrel 75 mg orally every day prior to admission, now on clopidogrel 75 mg orally every day. I think there is no evidence justifying anticoagulation and patient will not meet criteria for enrollment in RESPECT ESUS study due to low MRS function statuSwitch Plavix to aggrenox  s.Heparin 5000 units sq tid for VTE prophylaxis  NPO   Loop recorder interrogation requested but her last check last month still has not seen afib yet. (since 03/2014)  Therapy recommendations:  SNF  Ongoing aggressive risk factor management  Disposition:  SNF  Hypertension   Permissive hypertension <220/120 for 24-48 hours and then gradually normalize within 5-7 days  BP goal long term normotensive BP 152-179/53-73 past 24h (08/08/2014 @ 2:51 PM)  Stable  Hyperlipidemia  Home meds:  lipitor 40, resumed in hospital.  LDL 73, goal <70  Continue statin at discharge  Diabetes  HgbA1c 9.2, goal < 7.0  Need DM education again.   Uncontrolled  Other Stroke Risk Factors Advanced age Cigarette smoker, stopped smoking 8 weeks ago. 30 pack-year history ETOH use   Hx stroke/TIA o L BG, L cerebellar, L temporal lobe infarcts, embolic, loop recorder placed May 2015 o left periatrial white matter secondary to small vessel disease March 2015   Other Pertinent History  Thyroid disease  Chronic renal insufficiency  Anemia  Legally blind  PVD  COPD  STROKE Team will sign off. Call for questions   Hospital day # 2  I, the attending vascular neurologist, have personally obtained a history, examined the patient, evaluated laboratory data, individually viewed imaging studies, and  formulated the assessment and plan of care.  I have made any additions or clarifications directly to the above note and agree with the findings and plan as currently documented.    June Leap Stroke Neurology 08/08/2014 2:51 PM    To contact Stroke Continuity provider, please refer to [email or web address]. After hours, contact  General Neurology

## 2014-08-09 LAB — GLUCOSE, CAPILLARY
GLUCOSE-CAPILLARY: 210 mg/dL — AB (ref 70–99)
GLUCOSE-CAPILLARY: 230 mg/dL — AB (ref 70–99)
Glucose-Capillary: 186 mg/dL — ABNORMAL HIGH (ref 70–99)
Glucose-Capillary: 224 mg/dL — ABNORMAL HIGH (ref 70–99)
Glucose-Capillary: 225 mg/dL — ABNORMAL HIGH (ref 70–99)
Glucose-Capillary: 145 mg/dL — ABNORMAL HIGH (ref 70–99)
Glucose-Capillary: 167 mg/dL — ABNORMAL HIGH (ref 70–99)

## 2014-08-09 NOTE — Progress Notes (Signed)
[facility] PROGRESS NOTE  [name] [name] WUJ:[id number] DOB: [birth year] DOA: 08/06/2014 PCP: Alva Garnet., MD  Assessment/Plan: 1. Acute CVA - Management per neurology - MRI of brain reported Acute left corpus callosum infarct - Pt failed swallow evaluation - GOC meeting with palliative tomorrow 10/5 with POA (son)  Active Problems:   DM type 2, uncontrolled, with renal complications - placed on MIVF's - cbg q 4 hour while npo, continue SSI    CKD (chronic kidney disease), stage III - stable    Hypothyroidism - May have to change to IV form should patient continue npo status    COPD (chronic obstructive pulmonary disease) - stable, no wheezes   Code Status: full Family Communication: None at bedside. Disposition Plan: as listed above, pending final recommendations from neurologist   Consultants:  neurologist  Procedures:  Please see EMR  Antibiotics:  None  HPI/Subjective: Pt has no meaningful interaction with me.  Stares up at the ceiling when aroused (still the case today)   Objective: Filed Vitals:   08/09/14 0809  BP: 172/51  Pulse: 81  Temp: 98.3 F (36.8 C)  Resp:     Intake/Output Summary (Last 24 hours) at 08/09/14 1006 Last data filed at 08/09/14 0900  Gross per 24 hour  Intake 1333.75 ml  Output      0 ml  Net 1333.75 ml   Filed Weights   08/06/14 2248 08/07/14 0217 08/08/14 0358  Weight: 77.111 kg (170 lb) 75.6 kg (166 lb 10.7 oz) 76 kg (167 lb 8.8 oz)    Exam:   General:  Pt in NAD, awake, does not interact with examiner, arousable   Cardiovascular: rrr, no murmurs  Respiratory: cta bl , no wheezes  Abdomen: ND, soft  Musculoskeletal: no cyanosis   Data Reviewed: Basic Metabolic Panel:  Recent Labs Lab 08/02/14 1930 08/03/14 0858 08/03/14 1010 08/04/14 0330 08/06/14 2230 08/06/14 2245 08/07/14 0726  NA  --  143  --  144 144 144 147  K  --  3.1*  --  3.4* 4.0 3.9 4.6  CL  --  104  --  106 107 110  110  CO2  --  29  --  24 27  --  27  GLUCOSE  --  163*  --  92 181* 177* 137*  BUN  --  17  --  17 11 9 11   CREATININE  --  1.33*  --  1.33* 1.27* 1.20* 1.28*  CALCIUM 8.5 8.1*  --  8.1* 8.3*  --  8.4  MG  --   --  2.0  --   --   --   --    Liver Function Tests:  Recent Labs Lab 08/04/14 0330 08/06/14 2230 08/07/14 0726  AST 40* 21 20  ALT 29 16 16   ALKPHOS 71 67 68  BILITOT <0.2* 0.2* <0.2*  PROT 5.8* 5.9* 5.9*  ALBUMIN 2.3* 2.3* 2.3*   No results found for this basename: LIPASE, AMYLASE,  in the last 168 hours  Recent Labs Lab 08/03/14 1010  AMMONIA 21   CBC:  Recent Labs Lab 08/04/14 0330 08/06/14 2230 08/06/14 2245 08/07/14 0726  WBC 6.0 9.4  --  9.6  NEUTROABS  --  7.0  --  6.7  HGB 9.6* 9.1* 9.2* 9.5*  HCT 29.1* 28.0* 27.0* 29.8*  MCV 88.7 90.6  --  93.7  PLT 300 290  --  298   Cardiac Enzymes: No results found for this basename: CKTOTAL, CKMB,  CKMBINDEX, TROPONINI,  in the last 168 hours BNP (last 3 results)  Recent Labs  06/09/14 1344 06/12/14 1330 07/24/14 1000  PROBNP 1271.0* 1666.0* 6064.0*   CBG:  Recent Labs Lab 08/08/14 1200 08/08/14 2042 08/09/14 0038 08/09/14 0433 08/09/14 0809  GLUCAP 172* 186* 210* 230* 224*    No results found for this or any previous visit (from the past 240 hour(s)).   Studies: Mr Shirlee LatchMra Head Wo Contrast  08/08/2014   CLINICAL DATA:  Sudden onset of inability to speak beginning yesterday. Symptoms have now resolved. Stroke risk factors include previous stroke, diabetes, hypertension, hyperlipidemia, and chronic renal disease.  EXAM: MRI HEAD WITHOUT CONTRAST  MRA HEAD WITHOUT CONTRAST  TECHNIQUE: Multiplanar, multiecho pulse sequences of the brain and surrounding structures were obtained without intravenous contrast. Angiographic images of the head were obtained using MRA technique without contrast.  COMPARISON:  CT head 08/06/2014.  MR head 04/24/14  FINDINGS: MRI HEAD FINDINGS  Subcentimeter acute infarct in  the LEFT corpus callosum just lateral and superior to the genu. This lies within the LEFT ACA territory. No other areas of acute infarction are identified. No hemorrhage, mass lesion, or extra-axial fluid.  Hydrocephalus ex vacuo. Extensive small vessel disease throughout the periventricular and subcortical white matter. Widespread remote basal ganglia, thalamic, and deep white matter infarcts some of which were acute on the previous study from June. Partial empty sella. Cervical spondylosis. No osseous lesions. BILATERAL cataract extraction. Chronic sinus and mastoid fluid.  MRA HEAD FINDINGS  Misregistration of the process images precludes accurate stenosis evaluation. There is gross patency of the internal carotid arteries and basilar artery with RIGHT vertebral dominant. Suspected moderate RIGHT cavernous ICA disease and mild LEFT cavernous ICA disease suspected mild disease of the distal LEFT vertebral. There is no proximal flow reducing lesion of the anterior, middle, or posterior cerebral arteries. Both distal anterior cerebral arteries appear patent.  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  Advanced atrophy, small vessel disease, and widespread remote deep nuclei and deep white matter infarcts.  Patient motion results in misregistration of the MRA exam. Gross patency of the intracranial vasculature, including both anterior cerebral arteries, is established however.   Electronically Signed   By: Davonna BellingJohn  Curnes M.D.   On: 08/08/2014 09:54   Mr Brain Wo Contrast  08/08/2014   CLINICAL DATA:  Sudden onset of inability to speak beginning yesterday. Symptoms have now resolved. Stroke risk factors include previous stroke, diabetes, hypertension, hyperlipidemia, and chronic renal disease.  EXAM: MRI HEAD WITHOUT CONTRAST  MRA HEAD WITHOUT CONTRAST  TECHNIQUE: Multiplanar, multiecho pulse sequences of the brain and surrounding structures were obtained without  intravenous contrast. Angiographic images of the head were obtained using MRA technique without contrast.  COMPARISON:  CT head 08/06/2014.  MR head 04/24/14  FINDINGS: MRI HEAD FINDINGS  Subcentimeter acute infarct in the LEFT corpus callosum just lateral and superior to the genu. This lies within the LEFT ACA territory. No other areas of acute infarction are identified. No hemorrhage, mass lesion, or extra-axial fluid.  Hydrocephalus ex vacuo. Extensive small vessel disease throughout the periventricular and subcortical white matter. Widespread remote basal ganglia, thalamic, and deep white matter infarcts some of which were acute on the previous study from June. Partial empty sella. Cervical spondylosis. No osseous lesions. BILATERAL cataract extraction. Chronic sinus and mastoid fluid.  MRA HEAD FINDINGS  Misregistration of the process images precludes accurate stenosis evaluation. There is gross patency of the  internal carotid arteries and basilar artery with RIGHT vertebral dominant. Suspected moderate RIGHT cavernous ICA disease and mild LEFT cavernous ICA disease suspected mild disease of the distal LEFT vertebral. There is no proximal flow reducing lesion of the anterior, middle, or posterior cerebral arteries. Both distal anterior cerebral arteries appear patent.  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  Advanced atrophy, small vessel disease, and widespread remote deep nuclei and deep white matter infarcts.  Patient motion results in misregistration of the MRA exam. Gross patency of the intracranial vasculature, including both anterior cerebral arteries, is established however.   Electronically Signed   By: Davonna Belling M.D.   On: 08/08/2014 09:54    Scheduled Meds: . antiseptic oral rinse  7 mL Mouth Rinse q12n4p  . atorvastatin  40 mg Oral Daily  . budesonide-formoterol  2 puff Inhalation BID  . chlorhexidine  15 mL Mouth Rinse BID  .  dipyridamole-aspirin  1 capsule Oral BID  . heparin  5,000 Units Subcutaneous 3 times per day  . insulin aspart  0-9 Units Subcutaneous TID WC  . levothyroxine  50 mcg Oral QAC breakfast   Continuous Infusions: . dextrose 5 % and 0.45 % NaCl with KCl 20 mEq/L 50 mL/hr (08/08/14 1721)       Time spent: > 35 minutes    Penny Pia  [facility] Pager [phone]  If 7PM-7AM, please contact night-coverage at [email or web address], password [facility] 08/09/2014, 10:06 AM  LOS: 3 days

## 2014-08-09 NOTE — Progress Notes (Signed)
Thank you for consulting the Palliative Medicine Team at [facility] to meet your patient's and family's needs.   The reason that you asked us to see your patient is  For  Clarification of GOC and options  We have scheduled your patient for a meeting: tomorrow 08-11-15 at 4pm  The Surrogate decision make is:  Robb MatarLessley  McNeil/documented HPOA Contact information:  [phone]   Lorinda CreedMary Sybel Standish NP  Palliative Medicine Team Team Phone # [phone] Pager [phone]  Discussed with Dr Cena BentonVega

## 2014-08-09 NOTE — Progress Notes (Signed)
Pt is admitted to room 4N10 from [facility]. Admission vitals are stable

## 2014-08-10 DIAGNOSIS — Z7189 Other specified counseling: Secondary | ICD-10-CM

## 2014-08-10 DIAGNOSIS — R1314 Dysphagia, pharyngoesophageal phase: Secondary | ICD-10-CM

## 2014-08-10 DIAGNOSIS — Z515 Encounter for palliative care: Secondary | ICD-10-CM

## 2014-08-10 LAB — ANTI-DNA ANTIBODY, DOUBLE-STRANDED

## 2014-08-10 LAB — ANTI-SMITH ANTIBODY: ENA SM AB SER-ACNC: NEGATIVE

## 2014-08-10 LAB — GLUCOSE, CAPILLARY
GLUCOSE-CAPILLARY: 138 mg/dL — AB (ref 70–99)
GLUCOSE-CAPILLARY: 139 mg/dL — AB (ref 70–99)
GLUCOSE-CAPILLARY: 214 mg/dL — AB (ref 70–99)
GLUCOSE-CAPILLARY: 234 mg/dL — AB (ref 70–99)
Glucose-Capillary: 270 mg/dL — ABNORMAL HIGH (ref 70–99)
Glucose-Capillary: 158 mg/dL — ABNORMAL HIGH (ref 70–99)

## 2014-08-10 LAB — ANCA SCREEN W REFLEX TITER
ATYPICAL P-ANCA SCREEN: NEGATIVE
P-ANCA SCREEN: NEGATIVE
c-ANCA Screen: NEGATIVE

## 2014-08-10 LAB — SJOGRENS SYNDROME-A EXTRACTABLE NUCLEAR ANTIBODY: SSA (Ro) (ENA) Antibody, IgG: <1

## 2014-08-10 LAB — ANA: ANA: NEGATIVE

## 2014-08-10 LAB — SJOGRENS SYNDROME-B EXTRACTABLE NUCLEAR ANTIBODY: SSB (La) (ENA) Antibody, IgG: <1

## 2014-08-10 NOTE — Progress Notes (Signed)
Physical Therapy Treatment Patient Details Name: [name] FullingBetty J Job MRN: [id number] DOB: 10-Sep-1941 Today's Date: 08/10/2014    History of Present Illness 73 yo female admitted [facility] due to AMS expressive aphasia. BP on arrival 212/75. 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 DM, CVA ( L parietal, L Basal ganglia, L cerebellar, l temporal , R caudate    PT Comments    Patient more verbal when sitting up this session. Patient answered "yes" when asked if she wanted to sit up then stated "yes" when asked if she wanted to sit up in recliner. Monitored BP throughout and RN called and agreed for patient to be up. BP in sitting recliner 187/68. Patient meeting with palliative care this afternoon  Follow Up Recommendations  SNF;Supervision/Assistance - 24 hour     Equipment Recommendations  None recommended by PT    Recommendations for Other Services       Precautions / Restrictions Precautions Precautions: Fall Precaution Comments: watch BP    Mobility  Bed Mobility Overal bed mobility: Needs Assistance;+2 for physical assistance Bed Mobility: Rolling;Sidelying to Sit Rolling: +2 for physical assistance;Max assist Sidelying to sit: +2 for physical assistance;Max assist       General bed mobility comments: A for all aspects of transfer with use of chuck pad. patient able to assist slightly with facilitation of LEs.   Transfers Overall transfer level: Needs assistance Equipment used: None Transfers: Squat Pivot Transfers Sit to Stand: +2 physical assistance;Max assist   Squat pivot transfers: +2 physical assistance;Max assist;+2 safety/equipment;From elevated surface     General transfer comment: A for  all aspect of standing. Patient able to bear some weight through LEs but unable to stand fully upright. Patient able to control sitting onto recliner.   Ambulation/Gait                 Stairs             Wheelchair Mobility    Modified Rankin (Stroke Patients Only)       Balance Overall balance assessment: Needs assistance Sitting-balance support: Bilateral upper extremity supported;Feet supported Sitting balance-Leahy Scale: Fair Sitting balance - Comments: Patient able to sit EOB with hands in lap with minguard assist. required min A with balance during LE therex Postural control: Right lateral lean   Standing balance-Leahy Scale: Poor                      Cognition Arousal/Alertness: Lethargic Behavior During Therapy: Flat affect;Restless Overall Cognitive Status: History of cognitive impairments - at baseline   Orientation Level: Disoriented to;Situation;Time;Place   Memory: Decreased short-term memory Following Commands: Follows one step commands inconsistently   Awareness: Intellectual Problem Solving: Requires tactile cues;Decreased initiation;Slow processing General Comments: Patient became more alert when sitting EOB.     Exercises General Exercises - Lower Extremity Long Arc Quad: AAROM;Both;5 reps Shoulder Exercises Shoulder Extension: AAROM;Both;5 reps Elbow Flexion: AAROM;Both;5 reps    General Comments        Pertinent Vitals/Pain Pain Assessment: No/denies pain    Home Living Family/patient expects to be discharged to:: Skilled nursing facility                    Prior Function Level of Independence: Needs assistance    ADL's / Homemaking Assistance Needed: (A) with adls bathing/ dressing Comments: Patient legally blind   PT Goals (current goals can now be found in  the care plan section) Progress towards PT goals: Progressing toward goals    Frequency  Min 2X/week    PT Plan Current plan remains appropriate    Co-evaluation             End of Session Equipment Utilized During Treatment: Gait belt Activity Tolerance: Patient tolerated treatment well Patient left: in chair;with call bell/phone within  reach     Time: 1114-1141 PT Time Calculation (min): 27 min  Charges:  $Therapeutic Activity: 23-37 mins                    G Codes:      Fredrich Birks 08/10/2014, 1:03 PM 08/10/2014 Fredrich Birks PTA [phone] pager [phone] office

## 2014-08-10 NOTE — Progress Notes (Signed)
[facility] PROGRESS NOTE  [name] FullingBetty J [name] ZOX:[id number] DOB: [birth year] DOA: 08/06/2014 PCP: Alva GarnetSHELTON,KIMBERLY R., MD  Assessment/Plan: 1. Acute CVA - Management per neurology - MRI of brain reported Acute left corpus callosum infarct - Pt failed swallow evaluation - GOC meeting with palliative 08/10/14, will await disposition planning until this has been discussed with POA. Problem moving forward will be nutritional given that patient failed swallow study  Active Problems:   DM type 2, uncontrolled, with renal complications - placed on MIVF's - cbg q 4 hour while npo, continue SSI - BMP next am.    CKD (chronic kidney disease), stage III - stable    Hypothyroidism - May have to change to IV form should patient continue npo status    COPD (chronic obstructive pulmonary disease) - stable, no wheezes   Code Status: full Family Communication: None at bedside. Disposition Plan: as listed above, pending final recommendations from Palliative care team after goc has been achieved.   Consultants:  Neurologist  Palliative care team  Procedures:  Please see EMR  Antibiotics:  None  HPI/Subjective: Pt has no meaningful interaction with me.  Stares up at the ceiling .  Objective: Filed Vitals:   08/10/14 1038  BP: 158/103  Pulse:   Temp:   Resp:    No intake or output data in the 24 hours ending 08/10/14 1558 Filed Weights   08/06/14 2248 08/07/14 0217 08/08/14 0358  Weight: 77.111 kg (170 lb) 75.6 kg (166 lb 10.7 oz) 76 kg (167 lb 8.8 oz)    Exam:   General:  Pt in NAD, awake, does not interact with examiner  Cardiovascular: rrr, no murmurs  Respiratory: cta bl , no wheezes  Abdomen: ND, soft  Musculoskeletal: no cyanosis   Data Reviewed: Basic Metabolic Panel:  Recent Labs Lab 08/04/14 0330 08/06/14 2230 08/06/14 2245 08/07/14 0726  NA 144 144 144 147  K 3.4* 4.0 3.9 4.6  CL 106 107 110 110  CO2 24 27  --  27  GLUCOSE 92 181* 177*  137*  BUN 17 11 9 11   CREATININE 1.33* 1.27* 1.20* 1.28*  CALCIUM 8.1* 8.3*  --  8.4   Liver Function Tests:  Recent Labs Lab 08/04/14 0330 08/06/14 2230 08/07/14 0726  AST 40* 21 20  ALT 29 16 16   ALKPHOS 71 67 68  BILITOT <0.2* 0.2* <0.2*  PROT 5.8* 5.9* 5.9*  ALBUMIN 2.3* 2.3* 2.3*   No results found for this basename: LIPASE, AMYLASE,  in the last 168 hours No results found for this basename: AMMONIA,  in the last 168 hours CBC:  Recent Labs Lab 08/04/14 0330 08/06/14 2230 08/06/14 2245 08/07/14 0726  WBC 6.0 9.4  --  9.6  NEUTROABS  --  7.0  --  6.7  HGB 9.6* 9.1* 9.2* 9.5*  HCT 29.1* 28.0* 27.0* 29.8*  MCV 88.7 90.6  --  93.7  PLT 300 290  --  298   Cardiac Enzymes: No results found for this basename: CKTOTAL, CKMB, CKMBINDEX, TROPONINI,  in the last 168 hours BNP (last 3 results)  Recent Labs  06/09/14 1344 06/12/14 1330 07/24/14 1000  PROBNP 1271.0* 1666.0* 6064.0*   CBG:  Recent Labs Lab 08/09/14 2019 08/10/14 0006 08/10/14 0434 08/10/14 0756 08/10/14 1150  GLUCAP 167* 214* 234* 270* 138*    No results found for this or any previous visit (from the past 240 hour(s)).   Studies: No results found.  Scheduled Meds: . antiseptic oral rinse  7  mL Mouth Rinse q12n4p  . atorvastatin  40 mg Oral Daily  . budesonide-formoterol  2 puff Inhalation BID  . chlorhexidine  15 mL Mouth Rinse BID  . dipyridamole-aspirin  1 capsule Oral BID  . heparin  5,000 Units Subcutaneous 3 times per day  . insulin aspart  0-9 Units Subcutaneous TID WC  . levothyroxine  50 mcg Oral QAC breakfast   Continuous Infusions: . dextrose 5 % and 0.45 % NaCl with KCl 20 mEq/L 50 mL/hr at 08/09/14 1608       Time spent: > 35 minutes    Penny Pia  [facility] Pager [phone]  If 7PM-7AM, please contact night-coverage at [email or web address], password [facility] 08/10/2014, 3:58 PM  LOS: 4 days

## 2014-08-10 NOTE — Consult Note (Signed)
Patient WG:NFAOZ:[name] [name]      DOB: June 04, 1941      HYQ:[id number]     Consult Note from the Palliative Medicine Team at [facility]    Consult Requested by: Dr Cena BentonVega     PCP: Alva GarnetSHELTON,KIMBERLY R., MD Reason for Consultation: Clarification of GOC and options     Phone Number:[phone]  Assessment of patients Current state: Continued physical and functional decline 2/2 to multiple co-morbidities.  Patient and family faced with advanced directive decisions and anticipatory care needs.  Consult is for review of medical treatment options, clarification of goals of care and end of life issues, disposition and options, and symptom recommendation.  This NP Lorinda CreedMary Larach reviewed medical records, received report from team, assessed the patient and then meet at the patient's bedside along with her son/HPOA  [name] [name]  to discuss diagnosis, prognosis, GOC, EOL wishes disposition and options.  A detailed discussion was had today regarding advanced directives.  Concepts specific to code status, artifical feeding and hydration, continued IV antibiotics and rehospitalization was had.  The difference between a aggressive medical intervention path  and a palliative comfort care path for this patient at this time was had.  Values and goals of care important to patient and family were attempted to be elicited.  Concept of Hospice and Palliative Care were discussed  Natural trajectory and expectations at EOL were discussed.  Questions and concerns addressed.  Hard Choices booklet left for review. Family encouraged to call with questions or concerns.  PMT will continue to support holistically.   Goals of Care: 1.  Code Status:  DNR/DNI  2. Scope of Treatment: 1. Vital Signs:per unit  2. Respiratory/Oxygen:as needed for comfort 3. Nutritional Support/Tube Feeds: Hope for PEG for nutritional support 4. Antibiotics:yes 5. IVF: yes 6. Labs: yes 7. Telemetry: none 8. Consults: Hospice referral on  discharge    3. Disposition:  Likely back to SNF, recommend hospice referral   4. Symptom Management:   1.  Dysphagia:  NPO, evaluate for PEG placement   5. Psychosocial:  Emotional support offered to patient and her son at the bedside.    Patient Documents Completed or Given: Document Given Completed  Advanced Directives Pkt    MOST X   DNR    Gone from My Sight    Hard Choices X     Brief HPI: 73 y.o. female with a PMH of HTN, copd, cva, DM, CKD, recent admission for respiratory failure requiring intubation.  [facility]  ROS:  Unable to illicit due to lethargy   PMH:  Past Medical History  Diagnosis Date  . Hypertension   . Diabetes mellitus   . Chronic renal insufficiency   . Hyperlipidemia   . Osteopenia   . CVA (cerebral infarction)   . Thyroid disease      PSH: Past Surgical History  Procedure Laterality Date  . Eye surgery    . Loop recorder implant  03-24-2014    MDT LinQ implanted by Dr Johney FrameAllred for cryptogenic stroke  . Tee without cardioversion N/A 03/23/2014    Procedure: TRANSESOPHAGEAL ECHOCARDIOGRAM (TEE);  Surgeon: Lars MassonKatarina H Nelson, MD;  [facility];  Service: Cardiovascular;  Laterality: N/A;   I have reviewed the FH and SH and  If appropriate update it with new information. Allergies  Allergen Reactions  . Erythromycin Shortness Of Breath  . Iodine Hives  . Penicillins Hives   Scheduled Meds: . antiseptic oral  rinse  7 mL Mouth Rinse q12n4p  . atorvastatin  40 mg Oral Daily  . budesonide-formoterol  2 puff Inhalation BID  . chlorhexidine  15 mL Mouth Rinse BID  . dipyridamole-aspirin  1 capsule Oral BID  . heparin  5,000 Units Subcutaneous 3 times per day  . insulin aspart  0-9 Units Subcutaneous TID WC  . levothyroxine  50 mcg Oral QAC breakfast   Continuous Infusions: . dextrose 5 % and 0.45 % NaCl with KCl 20 mEq/L 50 mL/hr at 08/09/14 1608   PRN Meds:.hydrALAZINE,  senna-docusate    BP 158/103  Pulse 88  Temp(Src) 98.9 F (37.2 C) (Axillary)  Resp 20  Ht 5\' 3"  (1.6 m)  Wt 76 kg (167 lb 8.8 oz)  BMI 29.69 kg/m2  SpO2 97%   PPS: 30%  No intake or output data in the 24 hours ending 08/10/14 1616  Physical Exam:  General:  chronically ill appearing,  HEENT: moist buccal membranes, no exudate Chest:  Decreased in bases  CTA CVS: RRR Abdomen: soft NT +BS Ext: without edema Neuro: alert and oriented to person, place and situation Skin: warm and dry  Labs: CBC    Component Value Date/Time   WBC 9.6 08/07/2014 0726   RBC 3.18* 08/07/2014 0726   HGB 9.5* 08/07/2014 0726   HCT 29.8* 08/07/2014 0726   PLT 298 08/07/2014 0726   MCV 93.7 08/07/2014 0726   MCH 29.9 08/07/2014 0726   MCHC 31.9 08/07/2014 0726   RDW 13.7 08/07/2014 0726   LYMPHSABS 1.7 08/07/2014 0726   MONOABS 1.1* 08/07/2014 0726   EOSABS 0.0 08/07/2014 0726   BASOSABS 0.0 08/07/2014 0726    BMET    Component Value Date/Time   NA 147 08/07/2014 0726   K 4.6 08/07/2014 0726   CL 110 08/07/2014 0726   CO2 27 08/07/2014 0726   GLUCOSE 137* 08/07/2014 0726   BUN 11 08/07/2014 0726   CREATININE 1.28* 08/07/2014 0726   CALCIUM 8.4 08/07/2014 0726   CALCIUM 8.5 08/02/2014 1930   GFRNONAA 40* 08/07/2014 0726   GFRAA 46* 08/07/2014 0726    CMP     Component Value Date/Time   NA 147 08/07/2014 0726   K 4.6 08/07/2014 0726   CL 110 08/07/2014 0726   CO2 27 08/07/2014 0726   GLUCOSE 137* 08/07/2014 0726   BUN 11 08/07/2014 0726   CREATININE 1.28* 08/07/2014 0726   CALCIUM 8.4 08/07/2014 0726   CALCIUM 8.5 08/02/2014 1930   PROT 5.9* 08/07/2014 0726   ALBUMIN 2.3* 08/07/2014 0726   AST 20 08/07/2014 0726   ALT 16 08/07/2014 0726   ALKPHOS 68 08/07/2014 0726   BILITOT <0.2* 08/07/2014 0726   GFRNONAA 40* 08/07/2014 0726   GFRAA 46* 08/07/2014 0726     Time In Time Out Total Time Spent with Patient Total Overall Time  1600 1730 80 min 90 min    Greater than 50%  of this time was spent  counseling and coordinating care related to the above assessment and plan.   Lorinda Creed NP  Palliative Medicine Team Team Phone # [phone] Pager [phone]  Discussed with Dr Cena Benton

## 2014-08-10 NOTE — Progress Notes (Signed)
Inpatient Diabetes Program Recommendations  AACE/ADA: New Consensus Statement on Inpatient Glycemic Control (2013)  Target Ranges:  Prepandial:   less than 140 mg/dL      Peak postprandial:   less than 180 mg/dL (1-2 hours)      Critically ill patients:  140 - 180 mg/dL    Inpatient Diabetes Program Recommendations Insulin - Basal: Please add some basal insulin as fastings are high and averaage cbg's in mid to upper 100's. Pt takes 13 units at home. [facility] 15 [facility].  Thank you, Lenor CoffinAnn Latreshia Beauchaine, RN, CNS, Diabetes Coordinator [phone])

## 2014-08-10 NOTE — Evaluation (Signed)
Occupational Therapy Evaluation Patient Details Name: [name] [name] MRN: [id number] DOB: [birth year] Today's Date: 08/10/2014    History of Present Illness 73 yo female [facility] due to AMS expressive aphasia. BP on arrival 212/75. 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 DM, CVA ( L parietal, L Basal ganglia, L cerebellar, l temporal , R caudate   Clinical Impression   PT admitted with New CVA L corpus callosum infarct. Pt currently with functional limitiations due to the deficits listed below (see OT problem list). PTA at sink with total (A) for adls.  Pt will benefit from skilled OT to increase their independence and safety with adls and balance to allow discharge SNF. Ot to follow acutely for static sitting balance in prep for adls w/c level.     Follow Up Recommendations  SNF;Supervision/Assistance - 24 hour    Equipment Recommendations  Wheelchair (measurements OT);Wheelchair cushion (measurements OT);Other (comment);Hospital bed (hoyer lift)    Recommendations for Other Services       Precautions / Restrictions Precautions Precautions: Fall Precaution Comments: watch BP      Mobility Bed Mobility Overal bed mobility: Needs Assistance;+2 for physical assistance Bed Mobility: Supine to Sit;Rolling Rolling: +2 for physical assistance;Max assist Sidelying to sit: +2 for physical assistance;Max assist Supine to sit: +2 for physical assistance;Mod assist     General bed mobility comments: (A) transferring to L side  Transfers Overall transfer level: Needs assistance Equipment used: None Transfers: Squat Pivot Transfers Sit to Stand: +2 physical assistance;Max assist   Squat pivot transfers: +2 physical assistance;Max assist;+2 safety/equipment;From elevated surface     General transfer comment: not attempted due to BP incr/ level of arousal incr     Balance Overall balance assessment:  Needs assistance Sitting-balance support: Bilateral upper extremity supported;Feet supported Sitting balance-Leahy Scale: Zero Sitting balance - Comments: Pt lateral lean on the R and demonstrates pushers with L UE. Pt with posterior pelvic tilt Postural control: Right lateral lean   Standing balance-Leahy Scale: Poor                              ADL Overall ADL's : Needs assistance/impaired Eating/Feeding: NPO   Grooming: Wash/dry face;Moderate assistance Grooming Details (indicate cue type and reason): poor oral secretions, Pt with neck extended on arrival and gurging sounds. Concern for aspiration so pt with pillow added to help with neutral neck alignment. Pt with oral secretions from mouth. Pt with poor demo of swallowing with verbal cues. Pt required [place] for secretions                               General ADL Comments: Pt log rolled to the left and tech completed peri care. Pt progressed to EOB. pt demonstrates incr BP with supine sit. Pt provided prolonged sitting of 4 minutes with incr BP. pt returned to supine due to incr BP with mobility. Pt with incr arousal due to repositioning. Pt oriented to self and place. pt educated on  time and pt perseverating with stating the time over and over again. Pt with mittens      Vision                     Perception     Praxis Praxis Praxis tested?: Deficits Deficits: Perseveration    Pertinent Vitals/Pain  Pain Assessment: No/denies pain     Hand Dominance Right   Extremity/Trunk Assessment Upper Extremity Assessment Upper Extremity Assessment: Difficult to assess due to impaired cognition;LUE deficits/detail;RUE deficits/detail RUE Deficits / Details: restless, pulling at any tactile input including therapist clothing. Pt demonstrates 3 out 5 by wiping face with wash cloth for functional task LUE Deficits / Details: tone noted, digits flexed, able to complete PROM with tone noted. 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  LUE Coordination: decreased fine motor;decreased gross motor   Lower Extremity Assessment Lower Extremity Assessment: Defer to PT evaluation   Cervical / Trunk Assessment Cervical / Trunk Assessment: Kyphotic   Communication Communication Communication: Expressive difficulties;HOH   Cognition Arousal/Alertness: Lethargic Behavior During Therapy: Restless Overall Cognitive Status: History of cognitive impairments - at baseline   Orientation Level: Disoriented to;Situation;Time (able to state DOB, location as hospital)   Memory: Decreased short-term memory Following Commands: Follows one step commands inconsistently   Awareness: Intellectual Problem Solving: Requires tactile cues;Decreased initiation;Slow processing General Comments: Pt with arousal upon initial arrival. Pt opening eyes only to name call. BP montiored and progressed to EOB. pt upon EOB demonstrates sustain attention. pt engaging in grooming task.    General Comments       Exercises       Shoulder Instructions      Home Living Family/patient expects to be discharged to:: Skilled nursing facility                                        Prior Functioning/Environment Level of Independence: Needs assistance    ADL's / Homemaking Assistance Needed: (A) with adls bathing/ dressing   Comments: Patient legally blind    OT Diagnosis: Generalized weakness;Cognitive deficits   OT Problem List: Decreased strength;Decreased range of motion;Decreased activity tolerance;Impaired balance (sitting and/or standing);Decreased cognition;Decreased safety awareness;Decreased knowledge of use of DME or AE;Decreased knowledge of precautions;Obesity;Impaired UE functional use   OT Treatment/Interventions: Self-care/ADL training;Therapeutic exercise;DME and/or AE instruction;Therapeutic activities;Cognitive remediation/compensation;Patient/family education;Balance  training    OT Goals(Current goals can be found in the care plan section) Acute Rehab OT Goals Patient Stated Goal: did not state OT Goal Formulation: Patient unable to participate in goal setting Time For Goal Achievement: 08/24/14 Potential to Achieve Goals: Fair  OT Frequency: Min 1X/week   Barriers to D/C: Decreased caregiver support          Co-evaluation              End of Session Nurse Communication: Mobility status;Precautions  Activity Tolerance: Treatment limited secondary to medical complications (Comment);Other (comment) (Incr BP) Patient left: in bed;with call bell/phone within reach;with bed alarm set   Time: 1191-4782 OT Time Calculation (min): 24 min Charges:  OT General Charges $OT Visit: 1 Procedure OT Evaluation $Initial OT Evaluation Tier I: 1 Procedure OT Treatments $Self Care/Home Management : 8-22 mins G-Codes:    Harolyn Rutherford 08/25/14, 1:21 PM Pager: [phone]

## 2014-08-10 NOTE — Care Management Note (Addendum)
  Page 1 of 1   08/19/2014     3:49:30 PM CARE MANAGEMENT NOTE 08/19/2014  Patient:  Erin FullingVANS,Sanela J   Account Number:  [id number]  Date Initiated:  08/10/2014  Documentation initiated by:  Elmer BalesOBARGE,Sivan Quast  Subjective/Objective Assessment:   Patient was admitted with CVA. [facility] prior to admit.     Action/Plan:   Will follow for discharge needs pending PT/OT evals and physician orders.   Anticipated DC Date:     Anticipated DC Plan:  SKILLED NURSING FACILITY  In-house referral  Clinical Social Worker         Choice offered to / List presented to:             Status of service:  In process, will continue to follow Medicare Important Message given?  YES (If response is "NO", the following Medicare IM given date fields will be blank) Date Medicare IM given:  08/10/2014 Medicare IM given by:  Elmer BalesOBARGE,Kiyonna Tortorelli Date Additional Medicare IM given:  08/19/2014 Additional Medicare IM given by:  Elmer BalesOURTNEY Shardea Cwynar  Discharge Disposition:    Per UR Regulation:  Reviewed for med. necessity/level of care/duration of stay  If discussed at Long Length of Stay Meetings, dates discussed:    Comments:  08/19/14 1135 Elmer Balesourtney Mustaf Antonacci RN, MSN, CM- Addtional Medicare IM letter provided.    08/13/14 1020 Lotoya Casella RN, MSN, CM- Additional Medicare IM letter provided.   08/10/14 1115 Elmer Balesourtney Ema Hebner RN, MSN, CM- Medicare IM letter provided.

## 2014-08-10 NOTE — Clinical Social Work Psychosocial (Signed)
Clinical Social Work Department BRIEF PSYCHOSOCIAL ASSESSMENT 08/10/2014  Patient:  [name] FullingVANS,[name] [name]     Account Number:  [id number]     Admit date:  08/06/2014  Clinical Social Worker:  Derenda FennelNIXON,Imojean Yoshino, CLINICAL SOCIAL WORKER  Date/Time:  08/10/2014 03:11 PM  Referred by:  Care Management  Date Referred:  08/10/2014 Referred for  SNF Placement   Other Referral:   Interview type:  Other - See comment Other interview type:   Clinical Social Worker spoke with patient's son [name] [name] via telephone.    PSYCHOSOCIAL DATA Living Status:  FACILITY Admitted from facility:  [facility] Level of care:  Skilled Nursing Facility Primary support name:  [name] [name] [phone] Primary support relationship to patient:  CHILD, ADULT Degree of support available:   Strong    CURRENT CONCERNS Current Concerns  Post-Acute Placement   Other Concerns:    SOCIAL WORK ASSESSMENT / PLAN Clinical Social Worker spoke with pt's son [name] [name]([place]) via phone in reference to SNF placement. Pt's son confirmed pt is from/will be returning [facility] long-[facility] care. Pt's son also reported he was on his way to visit pt. CSW to follow up with pt and pt's son to provide continued support and facilitate pt discharge needs.   Assessment/plan status:  Psychosocial Support/Ongoing Assessment of Needs Other assessment/ plan:   Information/referral to community resources:    PATIENT'S/FAMILY'S RESPONSE TO PLAN OF CARE: Pt alert but not oriented at this time. Pt sitting at bedside watching tv. Pt's son reported/agreed to pt returning to [facility]. CSW remains available as needed/ for support.    Derenda FennelBashira Nykeem Citro, MSW, LCSWA [phone] 08/10/2014 3:30 PM

## 2014-08-11 DIAGNOSIS — Z515 Encounter for palliative care: Secondary | ICD-10-CM

## 2014-08-11 DIAGNOSIS — Z7189 Other specified counseling: Secondary | ICD-10-CM

## 2014-08-11 DIAGNOSIS — R1314 Dysphagia, pharyngoesophageal phase: Secondary | ICD-10-CM | POA: Diagnosis present

## 2014-08-11 LAB — ANTIPHOSPHOLIPID SYNDROME EVAL, BLD
ANTICARDIOLIPIN IGM: 4 [MPL'U]/mL — AB (ref <11)
Anticardiolipin IgA: 11 APL U/mL — ABNORMAL LOW (ref <22)
Anticardiolipin IgG: 6 GPL U/mL — ABNORMAL LOW (ref <23)
DRVVT: 32.1 s (ref <42.9)
Lupus Anticoagulant: NOT DETECTED
PTT LA: 38.6 s (ref 28.0–43.0)
Phosphatydalserine, IgA: 7 U/mL (ref <20)
Phosphatydalserine, IgG: 1 U/mL (ref <16)
Phosphatydalserine, IgM: 14 U/mL (ref <22)

## 2014-08-11 LAB — BASIC METABOLIC PANEL
Anion gap: 14 (ref 5–15)
BUN: 10 mg/dL (ref 6–23)
CO2: 23 mEq/L (ref 19–32)
Calcium: 8.7 mg/dL (ref 8.4–10.5)
Chloride: 111 mEq/L (ref 96–112)
Creatinine, Ser: 1.04 mg/dL (ref 0.50–1.10)
GFR calc Af Amer: 59 mL/min — ABNORMAL LOW (ref >90)
GFR, EST NON AFRICAN AMERICAN: 51 mL/min — AB (ref >90)
Glucose, Bld: 184 mg/dL — ABNORMAL HIGH (ref 70–99)
Potassium: 5.5 mEq/L — ABNORMAL HIGH (ref 3.7–5.3)
SODIUM: 148 meq/L — AB (ref 137–147)

## 2014-08-11 LAB — GLUCOSE, CAPILLARY
GLUCOSE-CAPILLARY: 136 mg/dL — AB (ref 70–99)
GLUCOSE-CAPILLARY: 169 mg/dL — AB (ref 70–99)
GLUCOSE-CAPILLARY: 212 mg/dL — AB (ref 70–99)
Glucose-Capillary: 220 mg/dL — ABNORMAL HIGH (ref 70–99)
Glucose-Capillary: 136 mg/dL — ABNORMAL HIGH (ref 70–99)
Glucose-Capillary: 135 mg/dL — ABNORMAL HIGH (ref 70–99)

## 2014-08-11 NOTE — Progress Notes (Signed)
Speech Language Pathology Treatment: Cognitive-Linquistic  Patient Details Name: [name] FullingBetty J [name] MRN: [id number] DOB: [birth year] Today's Date: 08/11/2014 Time: [id number] SLP Time Calculation (min): 9 min  Assessment / Plan / Recommendation Clinical Impression  Treatment focused on cognitive-linguistic goals.  Pt. lethargic requiring verbal/tactile cues frequently to sustain attention (3-5 second intervals).  She was unable to follow commands due to her decreased arousal.  Pt. responded several times in single word utterances (mostly social language).  Did not attempt po's due to significantly high aspiration risk due to lethargy and poor oral abilities.  Reviewed Palliative care note re: family desiring PEG; hopefully this will not be detrimental given her current fragile state.   HPI HPI: [name] with COPD, CVA, CK D., Diabetes admitted from SNF after refusing her medication, was nonverbal, brought in as a code stroke.  [facility] due to respiratory distress.  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     Pertinent Vitals Pain Assessment:  (no evidence pain)  SLP Plan  Continue with current plan of care    Recommendations  24 hr assist              Oral Care Recommendations: Oral care BID Follow up Recommendations: 24 hour supervision/assistance Plan: Continue with current plan of care    GO     Royce MacadamiaLitaker, Kaisey Huseby Willis 08/11/2014, 1:36 PM  Breck CoonsLisa Willis Lonell FaceLitaker M.Ed [employer] Pager [phone]

## 2014-08-11 NOTE — Progress Notes (Signed)
[facility] PROGRESS NOTE  [name] [name] WUJ:[id number] DOB: 27-Dec-1940 DOA: 08/06/2014 PCP: [name] GarnetSHELTON,KIMBERLY R., MD Brief narrative: Patient is a 73 year old with prior CVAs, hypertension, DM, hypothyroidism who presented to the hospital secondary to inability to speak. Upon further evaluation was found to have a new stroke. The patient has felt bedside swallow evaluation and after discussion with by mouth a day currently requests PEG tube placement as part of their goals of care.  Assessment/Plan: 1. Acute CVA - Management per neurology - MRI of brain reported Acute left corpus callosum infarct - Pt failed swallow evaluation - GOC meeting with palliative 08/10/14, POA currently desiring peg tube placement.  - Placed order for IR consult for peg tube placement per POA request.  Active Problems:   DM type 2, uncontrolled, with renal complications - placed on MIVF's - cbg q 4 hour while npo, continue SSI - BMP next am.    CKD (chronic kidney disease), stage III - stable    Hypothyroidism - May have to change to IV form should patient continue npo status    COPD (chronic obstructive pulmonary disease) - stable, no wheezes   Code Status: full Family Communication: None at bedside. Disposition Plan: Peg tube placement. After placement and after it has been deemed that peg tube can be used then may consider transitioning to skilled nursing facility.   Consultants:  Neurologist  Palliative care team  Procedures:  Please see EMR  Antibiotics:  None  HPI/Subjective: Pt has no meaningful interaction with me.  Stares up at the ceiling .  Objective: Filed Vitals:   08/11/14 1343  BP: 147/48  Pulse: 86  Temp: 98.2 F (36.8 C)  Resp: 18   No intake or output data in the 24 hours ending 08/11/14 1723 Filed Weights   08/06/14 2248 08/07/14 0217 08/08/14 0358  Weight: 77.111 kg (170 lb) 75.6 kg (166 lb 10.7 oz) 76 kg (167 lb 8.8 oz)    Exam:   General:  Pt  in NAD, awake, does not interact with examiner  Cardiovascular: rrr, no murmurs  Respiratory: cta bl , no wheezes  Abdomen: ND, soft  Musculoskeletal: no cyanosis   Data Reviewed: Basic Metabolic Panel:  Recent Labs Lab 08/06/14 2230 08/06/14 2245 08/07/14 0726 08/11/14 0004  NA 144 144 147 148*  K 4.0 3.9 4.6 5.5*  CL 107 110 110 111  CO2 27  --  27 23  GLUCOSE 181* 177* 137* 184*  BUN 11 9 11 10   CREATININE 1.27* 1.20* 1.28* 1.04  CALCIUM 8.3*  --  8.4 8.7   Liver Function Tests:  Recent Labs Lab 08/06/14 2230 08/07/14 0726  AST 21 20  ALT 16 16  ALKPHOS 67 68  BILITOT 0.2* <0.2*  PROT 5.9* 5.9*  ALBUMIN 2.3* 2.3*   No results found for this basename: LIPASE, AMYLASE,  in the last 168 hours No results found for this basename: AMMONIA,  in the last 168 hours CBC:  Recent Labs Lab 08/06/14 2230 08/06/14 2245 08/07/14 0726  WBC 9.4  --  9.6  NEUTROABS 7.0  --  6.7  HGB 9.1* 9.2* 9.5*  HCT 28.0* 27.0* 29.8*  MCV 90.6  --  93.7  PLT 290  --  298   Cardiac Enzymes: No results found for this basename: CKTOTAL, CKMB, CKMBINDEX, TROPONINI,  in the last 168 hours BNP (last 3 results)  Recent Labs  06/09/14 1344 06/12/14 1330 07/24/14 1000  PROBNP 1271.0* 1666.0* 6064.0*   CBG:  Recent  Labs Lab 08/11/14 0011 08/11/14 0403 08/11/14 0709 08/11/14 1339 08/11/14 1555  GLUCAP 169* 212* 220* 136* 135*    No results found for this or any previous visit (from the past 240 hour(s)).   Studies: No results found.  Scheduled Meds: . antiseptic oral rinse  7 mL Mouth Rinse q12n4p  . atorvastatin  40 mg Oral Daily  . budesonide-formoterol  2 puff Inhalation BID  . chlorhexidine  15 mL Mouth Rinse BID  . dipyridamole-aspirin  1 capsule Oral BID  . heparin  5,000 Units Subcutaneous 3 times per day  . insulin aspart  0-9 Units Subcutaneous TID WC  . levothyroxine  50 mcg Oral QAC breakfast   Continuous Infusions: . dextrose 5 % and 0.45 % NaCl  with KCl 20 mEq/L 50 mL/hr at 08/09/14 1608       Time spent: > 35 minutes    Penny Pia  [facility] Pager [phone]  If 7PM-7AM, please contact night-coverage at [email or web address], password [facility] 08/11/2014, 5:23 PM  LOS: 5 days

## 2014-08-11 NOTE — Consult Note (Signed)
I have reviewed and discussed case with Nurse Practitioner And agree with documentation and plan as noted above   Lili Harts J. Gavynn Duvall D.O.  Palliative Medicine Team at [place]  Team Phone: [phone]    

## 2014-08-12 ENCOUNTER — Inpatient Hospital Stay (HOSPITAL_COMMUNITY): Payer: Medicare HMO

## 2014-08-12 ENCOUNTER — Encounter (HOSPITAL_COMMUNITY): Payer: Self-pay | Admitting: Radiology

## 2014-08-12 DIAGNOSIS — N189 Chronic kidney disease, unspecified: Secondary | ICD-10-CM

## 2014-08-12 DIAGNOSIS — I639 Cerebral infarction, unspecified: Secondary | ICD-10-CM | POA: Diagnosis present

## 2014-08-12 DIAGNOSIS — N179 Acute kidney failure, unspecified: Secondary | ICD-10-CM

## 2014-08-12 LAB — GLUCOSE, CAPILLARY
GLUCOSE-CAPILLARY: 119 mg/dL — AB (ref 70–99)
GLUCOSE-CAPILLARY: 149 mg/dL — AB (ref 70–99)
Glucose-Capillary: 144 mg/dL — ABNORMAL HIGH (ref 70–99)
Glucose-Capillary: 156 mg/dL — ABNORMAL HIGH (ref 70–99)
Glucose-Capillary: 162 mg/dL — ABNORMAL HIGH (ref 70–99)
Glucose-Capillary: 185 mg/dL — ABNORMAL HIGH (ref 70–99)

## 2014-08-12 MED ORDER — LEVOTHYROXINE SODIUM 100 MCG IV SOLR
25.0000 ug | Freq: Every day | INTRAVENOUS | Status: DC
  Administered 2014-08-12 – 2014-08-17 (×6): 25 ug via INTRAVENOUS
  Filled 2014-08-12 (×7): qty 5

## 2014-08-12 MED ORDER — VANCOMYCIN HCL IN DEXTROSE 1-5 GM/200ML-% IV SOLN
1000.0000 mg | INTRAVENOUS | Status: AC
  Administered 2014-08-13: 1000 mg via INTRAVENOUS
  Filled 2014-08-12: qty 200

## 2014-08-12 NOTE — Progress Notes (Signed)
Above note regarding PIV attempts was done using ultrasound.

## 2014-08-12 NOTE — Progress Notes (Signed)
NUTRITION FOLLOW UP  Intervention:   When PEG is ready for use, recommend initiating Jevity 1.2 @ 20 ml/hr via PEG and increase by 10 ml every 4 hours to goal rate of 50 ml/hr.   30 ml Prostat BID.    Tube feeding regimen will provide 1640 kcal (100% of needs), 97 grams of protein, and 968 ml of H2O. (Will provide 220 grams of carbohydrates per 24 hours)  Monitor magnesium, potassium, and phosphorus daily for at least 3 days, MD to replete as needed, as pt is at risk for refeeding syndrome given NPO status > 7 days.  Nutrition Dx:   Inadequate oral intake related to inability to eat, lethargy as evidenced by NPO status; ongoing  Goal:   Pt to meet >/= 90% of their estimated nutrition needs; unmet  Monitor:   TF initiation, PO diet advancement, weight, labs, I/O's  Assessment:   73 y.o. female with PMH of CVA, hypertension, diabetes mellitus, hypothyroidism, recent admission for respiratory failure requiring intubation; presented with complaints of inability to speak and refusing to take her medication.  10/2: RD unable to obtain nutrition hx from patient; mittens on and unresponsive; pt seen per Clinical Nutrition during previous hospitalizations; pt with hx of poor PO intake; s/p bedside swallow evaluation today; + severe oral dysphagia; currently NPO; question whether TF initiation will be warranted.   10/7: Pt remains NPO. Per chart the patient has failed bedside swallow evaluation and after discussion with POA, palliative meeting, requested PEG tube placement as part of their goals of care. Pt asleep with no family present at time of visit. No TF at this time. Weight stable for past week.  Labs reviewed. Elevated glucose, elevated sodium, elevated potassium, decreased GFR   Height: Ht Readings from Last 1 Encounters:  08/07/14 5\' 3"  (1.6 m)    Weight Status:   Wt Readings from Last 1 Encounters:  08/08/14 167 lb 8.8 oz (76 kg)    Re-estimated needs:  Kcal: 1600-1800   Protein: 90-100 gm  Fluid: 1.7-1.9 L   Skin: stage II pressure ulcer to mid-sacrum; non-pitting RUE, LUE, RLE, and LLE edema  Diet Order: NPO   Intake/Output Summary (Last 24 hours) at 08/12/14 1708 Last data filed at 08/11/14 1745  Gross per 24 hour  Intake      0 ml  Output      0 ml  Net      0 ml    Last BM: 10/4   Labs:   Recent Labs Lab 08/06/14 2230 08/06/14 2245 08/07/14 0726 08/11/14 0004  NA 144 144 147 148*  K 4.0 3.9 4.6 5.5*  CL 107 110 110 111  CO2 27  --  27 23  BUN 11 9 11 10   CREATININE 1.27* 1.20* 1.28* 1.04  CALCIUM 8.3*  --  8.4 8.7  GLUCOSE 181* 177* 137* 184*    CBG (last 3)   Recent Labs  08/12/14 0631 08/12/14 1149 08/12/14 1626  GLUCAP 185* 144* 119*    Scheduled Meds: . antiseptic oral rinse  7 mL Mouth Rinse q12n4p  . atorvastatin  40 mg Oral Daily  . budesonide-formoterol  2 puff Inhalation BID  . chlorhexidine  15 mL Mouth Rinse BID  . dipyridamole-aspirin  1 capsule Oral BID  . heparin  5,000 Units Subcutaneous 3 times per day  . insulin aspart  0-9 Units Subcutaneous TID WC  . levothyroxine  25 mcg Intravenous Daily  . [START ON 08/13/2014] vancomycin  1,000 mg  Intravenous On Call    Continuous Infusions: . dextrose 5 % and 0.45 % NaCl with KCl 20 mEq/L 50 mL/hr at 08/09/14 1608    Ian Malkin RD, LDN Inpatient Clinical Dietitian Pager: [phone] After Hours Pager: [phone]

## 2014-08-12 NOTE — Progress Notes (Signed)
Patient ID: [name] [name]  female  ZOX:[id number]    DOB: [birth year]    DOA: 08/06/2014  PCP: Alva GarnetSHELTON,KIMBERLY R., MD  Brief narrative:  Patient is a 73 year old with prior CVAs, hypertension, DM, hypothyroidism who presented to the hospital secondary to inability to speak. Upon further evaluation was found to have a new stroke. The patient has failed bedside swallow evaluation and after discussion with POA, palliative meeting, requested PEG tube placement as part of their goals of care.       Assessment/Plan: Principal Problem:   CVA (cerebral infarction) - MRI Acute LEFT corpus callosum infarct just superior and lateral to the genu - MRA no large vessel occlusion - Carotid Dopplers showed no significance stenosis, 2-D echo unremarkable - TEE in 03/2014 showed PFO, so DVT screening done which was negative. - Patient was on Plavix 75 mg daily prior to admission, switched to Aggrenox by neurology - Failed swallow evaluation, awaiting PEG tube placement - PT recommended skilled nursing facility   Active Problems:   DM type 2, uncontrolled, with renal complications - Continue CBGs every 4 hours, continue sliding scale insulin    CKD (chronic kidney disease), stage III - Currently stable    Hypothyroidism - Change to IV Synthroid    COPD (chronic obstructive pulmonary disease) - Currently stable   Dysphagia, pharyngoesophageal phase - PEG tube placement   DVT Prophylaxis:  Code Status:  Family Communication:  Disposition:PEG tube placement pending, after PEG tube is functional, will transition to SNF  Consultants:  Neurology  Palliative care   Antibiotics:  None    Subjective: Aphasia, nonverbal, does not cooperate with exam  Objective: Weight change:   Intake/Output Summary (Last 24 hours) at 08/12/14 0949 Last data filed at 08/11/14 1745  Gross per 24 hour  Intake      0 ml  Output      0 ml  Net      0 ml   Blood pressure 165/75, pulse 85,  temperature 98.7 F (37.1 C), temperature source Axillary, resp. rate 20, height 5\' 3"  (1.6 m), weight 76 kg (167 lb 8.8 oz), SpO2 99.00%.  Physical Exam: General:  awake, NAD  CVS: S1-S2 clear, no murmur rubs or gallops Chest: clear to auscultation bilaterally, no wheezing, rales or rhonchi Abdomen: soft nontender, nondistended, normal bowel sounds  Extremities: no cyanosis, clubbing or edema noted bilaterally Neuro:  unable to assess, does not cooperate with exam  Lab Results: Basic Metabolic Panel:  Recent Labs Lab 08/07/14 0726 08/11/14 0004  NA 147 148*  K 4.6 5.5*  CL 110 111  CO2 27 23  GLUCOSE 137* 184*  BUN 11 10  CREATININE 1.28* 1.04  CALCIUM 8.4 8.7   Liver Function Tests:  Recent Labs Lab 08/06/14 2230 08/07/14 0726  AST 21 20  ALT 16 16  ALKPHOS 67 68  BILITOT 0.2* <0.2*  PROT 5.9* 5.9*  ALBUMIN 2.3* 2.3*   No results found for this basename: LIPASE, AMYLASE,  in the last 168 hours No results found for this basename: AMMONIA,  in the last 168 hours CBC:  Recent Labs Lab 08/06/14 2230 08/06/14 2245 08/07/14 0726  WBC 9.4  --  9.6  NEUTROABS 7.0  --  6.7  HGB 9.1* 9.2* 9.5*  HCT 28.0* 27.0* 29.8*  MCV 90.6  --  93.7  PLT 290  --  298   Cardiac Enzymes: No results found for this basename: CKTOTAL, CKMB, CKMBINDEX, TROPONINI,  in the last 168 hours  BNP: No components found with this basename: POCBNP,  CBG:  Recent Labs Lab 08/11/14 1555 08/11/14 2004 08/12/14 0031 08/12/14 0357 08/12/14 0631  GLUCAP 135* 136* 156* 162* 185*     Micro Results: No results found for this or any previous visit (from the past 240 hour(s)).  Studies/Results: Ct Head Wo Contrast  08/06/2014   CLINICAL DATA:  Code stroke.  Facial droop.  EXAM: CT HEAD WITHOUT CONTRAST  TECHNIQUE: Contiguous axial images were obtained from the base of the skull through the vertex without intravenous contrast.  COMPARISON:  08/04/2014  FINDINGS: Diffuse cerebral atrophy.  Ventricular dilatation consistent with central atrophy. Low-attenuation changes throughout the deep white matter consistent with small vessel ischemia. Old lacunar infarcts in the right caudate lobe, left thalamus, and left cerebellum. No change since previous study. No mass effect or midline shift. No abnormal extra-axial fluid collections. Gray-white matter junctions are distinct. Basal cisterns are not effaced. No evidence of acute intracranial hemorrhage. No depressed skull fractures. Small retention cyst in the sphenoid sinus. Mastoid air cells are not opacified.  IMPRESSION: No acute intracranial abnormalities. Chronic atrophy and small vessel ischemic changes with multiple old lacunar infarcts.  These results were called by telephone at the time of interpretation on 08/06/2014 at 10:52 pm to Dr. Vanetta Mulders , who verbally acknowledged these results.   Electronically Signed   By: Burman Nieves M.D.   On: 08/06/2014 22:54   Ct Head Wo Contrast  08/04/2014   CLINICAL DATA:  Facial droop and left upper extremity paresis.  EXAM: CT HEAD WITHOUT CONTRAST  TECHNIQUE: Contiguous axial images were obtained from the base of the skull through the vertex without intravenous contrast.  COMPARISON:  CT scan of July 24, 2014.  FINDINGS: Bony calvarium appears intact. Mild diffuse cortical atrophy is noted. Moderate chronic ischemic white matter disease is noted. Stable calcifications are noted in the basal ganglia bilaterally. No mass effect or midline shift is noted. Ventricular size is within normal limits. There is no evidence of mass lesion, hemorrhage or acute infarction.  IMPRESSION: Mild diffuse cortical atrophy. Moderate chronic ischemic white matter disease. No acute intracranial abnormality seen.   Electronically Signed   By: Roque Lias M.D.   On: 08/04/2014 14:02   Ct Head Wo Contrast  07/24/2014   CLINICAL DATA:  Elevated blood glucose, hypertensive, mental status change  EXAM: CT HEAD WITHOUT  CONTRAST  TECHNIQUE: Contiguous axial images were obtained from the base of the skull through the vertex without intravenous contrast.  COMPARISON:  06/12/2014  FINDINGS: There is no evidence of mass effect, midline shift, or extra-axial fluid collections. There is no evidence of a space-occupying lesion or intracranial hemorrhage. There is no evidence of a cortical-based area of acute infarction. There is an old right basal ganglia and bilateral thalamic lacunar infarct. There is generalized cerebral atrophy. There is periventricular white matter low attenuation likely secondary to microangiopathy.  The ventricles and sulci are appropriate for the patient's age. The basal cisterns are patent.  Visualized portions of the orbits are unremarkable. The visualized portions of the paranasal sinuses and mastoid air cells are unremarkable. Cerebrovascular atherosclerotic calcifications are noted.  The osseous structures are unremarkable.  IMPRESSION: No acute intracranial pathology.   Electronically Signed   By: Elige Ko   On: 07/24/2014 11:06   Mr Maxine Glenn Head Wo Contrast  08/08/2014   CLINICAL DATA:  Sudden onset of inability to speak beginning yesterday. Symptoms have now resolved. Stroke risk factors include  previous stroke, diabetes, hypertension, hyperlipidemia, and chronic renal disease.  EXAM: MRI HEAD WITHOUT CONTRAST  MRA HEAD WITHOUT CONTRAST  TECHNIQUE: Multiplanar, multiecho pulse sequences of the brain and surrounding structures were obtained without intravenous contrast. Angiographic images of the head were obtained using MRA technique without contrast.  COMPARISON:  CT head 08/06/2014.  MR head 04/24/14  FINDINGS: MRI HEAD FINDINGS  Subcentimeter acute infarct in the LEFT corpus callosum just lateral and superior to the genu. This lies within the LEFT ACA territory. No other areas of acute infarction are identified. No hemorrhage, mass lesion, or extra-axial fluid.  Hydrocephalus ex vacuo. Extensive  small vessel disease throughout the periventricular and subcortical white matter. Widespread remote basal ganglia, thalamic, and deep white matter infarcts some of which were acute on the previous study from June. Partial empty sella. Cervical spondylosis. No osseous lesions. BILATERAL cataract extraction. Chronic sinus and mastoid fluid.  MRA HEAD FINDINGS  Misregistration of the process images precludes accurate stenosis evaluation. There is gross patency of the internal carotid arteries and basilar artery with RIGHT vertebral dominant. Suspected moderate RIGHT cavernous ICA disease and mild LEFT cavernous ICA disease suspected mild disease of the distal LEFT vertebral. There is no proximal flow reducing lesion of the anterior, middle, or posterior cerebral arteries. Both distal anterior cerebral arteries appear patent.  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  Advanced atrophy, small vessel disease, and widespread remote deep nuclei and deep white matter infarcts.  Patient motion results in misregistration of the MRA exam. Gross patency of the intracranial vasculature, including both anterior cerebral arteries, is established however.   Electronically Signed   By: Davonna Belling M.D.   On: 08/08/2014 09:54   Mr Brain Wo Contrast  08/08/2014   CLINICAL DATA:  Sudden onset of inability to speak beginning yesterday. Symptoms have now resolved. Stroke risk factors include previous stroke, diabetes, hypertension, hyperlipidemia, and chronic renal disease.  EXAM: MRI HEAD WITHOUT CONTRAST  MRA HEAD WITHOUT CONTRAST  TECHNIQUE: Multiplanar, multiecho pulse sequences of the brain and surrounding structures were obtained without intravenous contrast. Angiographic images of the head were obtained using MRA technique without contrast.  COMPARISON:  CT head 08/06/2014.  MR head 04/24/14  FINDINGS: MRI HEAD FINDINGS  Subcentimeter acute infarct in the LEFT corpus callosum just  lateral and superior to the genu. This lies within the LEFT ACA territory. No other areas of acute infarction are identified. No hemorrhage, mass lesion, or extra-axial fluid.  Hydrocephalus ex vacuo. Extensive small vessel disease throughout the periventricular and subcortical white matter. Widespread remote basal ganglia, thalamic, and deep white matter infarcts some of which were acute on the previous study from June. Partial empty sella. Cervical spondylosis. No osseous lesions. BILATERAL cataract extraction. Chronic sinus and mastoid fluid.  MRA HEAD FINDINGS  Misregistration of the process images precludes accurate stenosis evaluation. There is gross patency of the internal carotid arteries and basilar artery with RIGHT vertebral dominant. Suspected moderate RIGHT cavernous ICA disease and mild LEFT cavernous ICA disease suspected mild disease of the distal LEFT vertebral. There is no proximal flow reducing lesion of the anterior, middle, or posterior cerebral arteries. Both distal anterior cerebral arteries appear patent.  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  Advanced atrophy, small vessel disease, and widespread remote deep nuclei and deep white matter infarcts.  Patient motion results in misregistration of the MRA exam. Gross patency of the intracranial vasculature, including  both anterior cerebral arteries, is established however.   Electronically Signed   By: Davonna Belling M.D.   On: 08/08/2014 09:54   Dg Chest Port 1 View  08/07/2014   CLINICAL DATA:  Short of breath. Labored in shallow breathing. Unresponsive.  EXAM: PORTABLE CHEST - 1 VIEW  COMPARISON:  07/29/2014  FINDINGS: Probable linear atelectasis in the left mid lung. Normal heart size and pulmonary vascularity. No focal airspace disease in the lungs. No blunting of costophrenic angles. No pneumothorax. Mediastinal contours appear intact.  IMPRESSION: No active disease.   Electronically Signed    By: Burman Nieves M.D.   On: 08/07/2014 00:02   Dg Chest Port 1 View  07/29/2014   CLINICAL DATA:  Intubation.  EXAM: PORTABLE CHEST - 1 VIEW  COMPARISON:  07/28/2014.  FINDINGS: Endotracheal tube 1.9 cm above the carina. Left IJ line in NG tube in stable position. Cardiac monitor stable position. Heart size is stable. Pulmonary vascularity is normal. Bibasilar atelectasis and/or infiltrates are present. Small bilateral pleural effusions cannot be excluded. A mild component congestive heart failure cannot be excluded. No pneumothorax. No acute osseus abnormality.  IMPRESSION: 1. Interval reposition of endotracheal tube. Its tip is now 1.9 cm above the carina left IJ line, and NG tube in stable position. 2. Interval development of bibasilar infiltrates and possible tiny pleural effusions. Findings suggest mild CHF. Bibasilar pneumonia cannot be excluded.   Electronically Signed   By: Maisie Fus  Register   On: 07/29/2014 07:45   Dg Chest Portable 1 View  07/28/2014   CLINICAL DATA:  Endotracheal tube and orogastric tube placement  EXAM: PORTABLE CHEST - 1 VIEW  COMPARISON:  07/27/2014  FINDINGS: Endotracheal tube is seen with tip directly at the carina directed toward left main bronchus. Left internal jugular central line is seen with tip over the junction of the brachiocephalic vein and superior vena cava. There is no pneumothorax. NG tube crosses the gastroesophageal junction.  Right lung is clear. Partial consolidation left lower lobe most consistent with partial atelectasis, improved when compared to the prior study. When compared to prior study there is improved aeration in the left lower lobe.  IMPRESSION: Endotracheal tube at the carina. Consider retracting the tube 2-3 cm and repeating the image. Continued atelectasis left lower lobe, but improved when compared to prior study. These results were called by telephone at the time of interpretation on 07/28/2014 at 2:23 am to Joy, the patient's charge nurse,  who verbally acknowledged these results.   Electronically Signed   By: Esperanza Heir M.D.   On: 07/28/2014 02:23   Dg Chest Port 1 View  07/27/2014   CLINICAL DATA:  Pneumonitis.  EXAM: PORTABLE CHEST - 1 VIEW  COMPARISON:  07/26/2014.  FINDINGS: Endotracheal tube ends between the clavicular heads and carina. Left IJ catheter is likely stable in positioning, although there is new distortion from leftward rotation. Orogastric tube enters the stomach at least.  There is no change in heart size and mediastinal contours when accounting for rotation. Implantable cardiac monitor has a stable orientation.  Worsening basilar lung aeration. Hazy appearance favors atelectasis and possibly small pleural effusion. There is no edema or pneumothorax.  IMPRESSION: 1. Stable positioning of tubes and central line. 2. Significant improvement and left upper lobe aeration. Rapid clearing favors pneumonitis or atelectasis over pneumonia. 3. New bibasilar atelectasis.   Electronically Signed   By: Tiburcio Pea M.D.   On: 07/27/2014 06:25   Dg Chest [facility] [address]  07/26/2014   CLINICAL DATA:  Ventilator dependent respiratory failure. Followup left upper lobe pneumonia.  EXAM: PORTABLE CHEST - 1 VIEW  COMPARISON:  Portable chest x-rays yesterday, 07/24/2014, 06/13/2014.  FINDINGS: Endotracheal tube tip in satisfactory position projecting approximately 4-5 cm above the carina. Nasogastric tube courses below the diaphragm into the stomach. Left jugular sent venous catheter tip projects over the upper SVC, unchanged.  Cardiac silhouette upper normal in size to slightly enlarged but stable. Airspace consolidation in the left upper lobe, slightly worse than on yesterday's examination. No new pulmonary parenchymal abnormalities.  IMPRESSION: Support apparatus satisfactory. Slight worsening of left upper lobe pneumonia since yesterday. No new abnormalities.   Electronically Signed   By: Hulan Saas M.D.   On: 07/26/2014 09:10    Portable Chest Xray In Am  07/25/2014   CLINICAL DATA:  Follow-up endotracheal tube position.  EXAM: PORTABLE CHEST - 1 VIEW  COMPARISON:  07/24/2014  FINDINGS: Endotracheal tube remains in place with tip approximately 2-2.5 cm above the carina. Left jugular central venous catheter is unchanged with tip near the brachiocephalic vein confluence. Enteric tube courses into the left upper abdomen. Loop recorder is again seen. Cardiac silhouette is within normal limits for size. Left perihilar opacity has decreased from the prior study. Right lung remains clear. No definite pleural effusion or pneumothorax is identified.  IMPRESSION: 1. Endotracheal tube 2-2.5 cm above the carina. 2. Improved left upper lobe aeration.   Electronically Signed   By: Sebastian Ache   On: 07/25/2014 09:13   Portable Chest Xray  07/24/2014   CLINICAL DATA:  Interval intubation of the patient ; assess support tube and line. Placement  EXAM: PORTABLE CHEST - 1 VIEW  COMPARISON:  Portable chest x-ray of today's date.  FINDINGS: The endotracheal tube tip lies 2 cm above the crotch of the carina. The right internal jugular venous catheter tip lies at the level of the junction of the right and left brachiocephalic veins. The esophagogastric tube tip projects below the inferior margin of the image. A loop recorder is visible over the left atrial region. The cardiac silhouette is normal in size. The pulmonary vascularity is not engorged. There is confluent alveolar opacity in the left perihilar region that is more conspicuous than on the earlier study. The bony thorax exhibits no acute abnormality.  IMPRESSION: 1. The endotracheal tube tip lies 2.1 cm above the crotch of the carina. Withdrawal by 2-3 cm is recommended to avoid accidental mainstem bronchus intubation. 2. The left internal jugular venous catheter and the esophagogastric tube are in appropriate position radiographically. There is no postprocedure pneumothorax or hemothorax. 3.  There is increased alveolar opacity in the low left perihilar region consistent with pneumonia. 4. These results were called by telephone at the time of interpretation on 07/24/2014 at 7:21 pm to [facility], RN, who verbally acknowledged these results.   Electronically Signed   By: David  [place]   On: 07/24/2014 19:26   Dg Chest Port 1 View  07/24/2014   CLINICAL DATA:  Recent aspiration  EXAM: PORTABLE CHEST - 1 VIEW  COMPARISON:  06/13/2014  FINDINGS: The cardiac shadow is stable. Monitoring device is again seen. The right lung is clear. The left lung demonstrates patchy infiltrate in the upper lobe which may be related to the patient's recent aspiration.  IMPRESSION: Left upper lobe infiltrate. Followup films to resolution are recommended.   Electronically Signed   By: Alcide Clever M.D.   On: 07/24/2014 11:09  Dg Abd Portable 1v  07/30/2014   CLINICAL DATA:  Feeding tube placement  EXAM: PORTABLE ABDOMEN - 1 VIEW  COMPARISON:  Portable exam 1642 hr compared to 07/28/2014  FINDINGS: Feeding tube coiled at mid stomach with tip reflected to fundus.  Nonobstructive bowel gas pattern.  Minimal atelectasis and questionable fusions at lung bases.  Loop recorder projects over chest.  IMPRESSION: Feeding tube coiled at mid to proximal stomach.   Electronically Signed   By: Ulyses Southward M.D.   On: 07/30/2014 16:55   Dg Abd Portable 1v  07/28/2014   CLINICAL DATA:  Endotracheal tube and orogastric tube placement  EXAM: PORTABLE ABDOMEN - 1 VIEW  COMPARISON:  None.  FINDINGS: Orogastric tube crosses the gastroesophageal junction with tip in the left upper quadrant in the anticipated position of stomach. Endotracheal tube is also seen on this study with tip about 12 mm above the carinal.  IMPRESSION: Endotracheal tube and OG tube as described.   Electronically Signed   By: Esperanza Heir M.D.   On: 07/28/2014 02:24   Dg Swallowing Func-speech Pathology  08/01/2014   Riley Nearing Deblois, CCC-SLP     08/01/2014   1:43 PM Objective Swallowing Evaluation: Modified Barium Swallowing Study   Patient Details  Name: RAYNELL UPTON MRN: [id number] Date of Birth: 12/14/1940  Today's Date: 08/01/2014 Time: 1300-1330 SLP Time Calculation (min): 30 min  Past Medical History:  Past Medical History  Diagnosis Date  . Hypertension   . Diabetes mellitus   . Chronic renal insufficiency   . Hyperlipidemia   . Osteopenia   . CVA (cerebral infarction)   . Thyroid disease    Past Surgical History:  Past Surgical History  Procedure Laterality Date  . Eye surgery    . Loop recorder implant  03-24-2014    MDT LinQ implanted by Dr Johney Frame for cryptogenic stroke  . Tee without cardioversion N/A 03/23/2014    Procedure: TRANSESOPHAGEAL ECHOCARDIOGRAM (TEE);  Surgeon:  Lars Masson, MD;  Location: [facility] ENDOSCOPY;  Service:  Cardiovascular;  Laterality: N/A;   HPI:  [name] with COPD, CVA, CK D., Diabetes admitted 9/18  with acute respiratory distress , hypercarbic respiratory  failure, DKA with glucose of 802 and anion gap of 19 , requiring  mechanical ventilation. Chest x-ray showed left upper lobe  infiltrate. Was placed on BiPAP initially, but unresponsive on  arrival to the ICU, hence intubated. She had recent admit from  8/6 to 814 for hypercarbic failure that improved with BiPAP-dc'd  to NH , then home since 9/14. 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 Patient has been seen by SLP  during previous admissions and has been observed with a delayed  oral phase and suspected delayed swallow initiation with s/s of  aspiration with thin liquids via straw. Most frequent diet  recommendations for dysphagia 1 solids, thin liquids.      Assessment / Plan / Recommendation Clinical Impression  Dysphagia Diagnosis: Moderate oral phase dysphagia;Mild  pharyngeal phase dysphagia Clinical impression: Pt presents with a primary cognitive based,  oral dyspahgia with lingual pumping and piecemeal transit of PO,  leading to multiple  swallows per bolus. Swallow response is only  slightly delayed but mild base of tongue weakness results in  trace vallecular residuals and very trace penetration after the  swallow with nectar thick liquids. Pt senses penetrate and expels  with a soft throat clear. Given overt aspiration of thin at  bedside and pts  AMS, thin liquids not tested though expect pt  will be able to upgrade as mentation improves. Pt to initiate dys  1 (puree) diet and nectar thick liquids. SLP will follow for  tolerance.      Treatment Recommendation  Therapy as outlined in treatment plan below    Diet Recommendation Dysphagia 1 (Puree);Nectar-thick liquid   Liquid Administration via: Cup;Straw Medication Administration: Whole meds with puree Supervision: Staff to assist with self feeding Compensations: Slow rate;Small sips/bites Postural Changes and/or Swallow Maneuvers: Seated upright 90  degrees;Upright 30-60 min after meal    Other  Recommendations Oral Care Recommendations: Oral care BID Other Recommendations: Order thickener from pharmacy   Follow Up Recommendations  Skilled Nursing facility    Frequency and Duration min 2x/week  2 weeks   Pertinent Vitals/Pain NA    SLP Swallow Goals     General HPI: [name] with COPD, CVA, CK D., Diabetes  admitted 9/18 with acute respiratory distress , hypercarbic  respiratory failure, DKA with glucose of 802 and anion gap of 19  , requiring mechanical ventilation. Chest x-ray showed left upper  lobe infiltrate. Was placed on BiPAP initially, but unresponsive  on arrival to the ICU, hence intubated. She had recent admit from  8/6 to 814 for hypercarbic failure that improved with BiPAP-dc'd  to NH , then home since 9/14. 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 Patient has been seen by SLP  during previous admissions and has been observed with a delayed  oral phase and suspected delayed swallow initiation with s/s of  aspiration with thin liquids via straw. Most  frequent diet  recommendations for dysphagia 1 solids, thin liquids.  Type of Study: Modified Barium Swallowing Study Reason for Referral: Objectively evaluate swallowing function Previous Swallow Assessment: see HPI Diet Prior to this Study: NPO Temperature Spikes Noted: No Respiratory Status: Room air History of Recent Intubation: Yes Length of Intubations (days): 2 days Date extubated: 07/29/14 Behavior/Cognition: Alert;Cooperative;Requires cueing Oral Cavity - Dentition: Edentulous Oral Motor / Sensory Function: Within functional limits Self-Feeding Abilities: Total assist Patient Positioning: Upright in chair Baseline Vocal Quality: Clear Volitional Cough: Weak Volitional Swallow: Able to elicit Pharyngeal Secretions: Not observed secondary MBS    Reason for Referral Objectively evaluate swallowing function   Oral Phase Oral Preparation/Oral Phase Oral Phase: Impaired Oral - Nectar Oral - Nectar Cup: Lingual pumping;Reduced posterior  propulsion;Piecemeal swallowing;Delayed oral transit Oral - Nectar Straw: Lingual pumping;Reduced posterior  propulsion;Piecemeal swallowing;Delayed oral transit Oral - Solids Oral - Puree: Lingual pumping;Reduced posterior  propulsion;Piecemeal swallowing;Delayed oral transit   Pharyngeal Phase Pharyngeal Phase Pharyngeal Phase: Impaired Pharyngeal - Nectar Pharyngeal - Nectar Cup: Reduced tongue base  retraction;Penetration/Aspiration after swallow;Pharyngeal  residue - valleculae Penetration/Aspiration details (nectar cup): Material enters  airway, CONTACTS cords then ejected out;Material does not enter  airway Pharyngeal - Nectar Straw: Reduced tongue base  retraction;Penetration/Aspiration after swallow;Pharyngeal  residue - valleculae Penetration/Aspiration details (nectar straw): Material does not  enter airway;Material enters airway, CONTACTS cords then ejected  out Pharyngeal - Solids Pharyngeal - Puree: Reduced tongue base retraction;Pharyngeal  residue - valleculae  Penetration/Aspiration details (puree): Material does not enter  airway  Cervical Esophageal Phase    GO    Cervical Esophageal Phase Cervical Esophageal Phase: Impaired Cervical Esophageal Phase - Comment Cervical Esophageal Comment: Appearance of slow transit, stasis  through GE junction        [employer], MA CCC-SLP [phone]  Claudine Mouton 08/01/2014, 1:42 PM  Medications: Scheduled Meds: . antiseptic oral rinse  7 mL Mouth Rinse q12n4p  . atorvastatin  40 mg Oral Daily  . budesonide-formoterol  2 puff Inhalation BID  . chlorhexidine  15 mL Mouth Rinse BID  . dipyridamole-aspirin  1 capsule Oral BID  . heparin  5,000 Units Subcutaneous 3 times per day  . insulin aspart  0-9 Units Subcutaneous TID WC  . levothyroxine  50 mcg Oral QAC breakfast      LOS: 6 days   Jamelyn Bovard M.D. [facility] 08/12/2014, 9:49 AM Pager: [phone]  If 7PM-7AM, please contact night-coverage [email or web address] Password [email or web address]

## 2014-08-12 NOTE — H&P (Signed)
Reason for Consult: Dysphagia secondary to CVA Chief Complaint: Chief Complaint  Patient presents with  . Code Stroke   Referring Physician(s): TRH  History of Present Illness: [name] [name] is a 73 y.o. 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 History is obtained per chart review as patient does not respond to questioning and no family is present at time of examination. Patient's family has met with palliative care and request made for G-tube.   Past Medical History  Diagnosis Date  . Hypertension   . Diabetes mellitus   . Chronic renal insufficiency   . Hyperlipidemia   . Osteopenia   . CVA (cerebral infarction)   . Thyroid disease     Past Surgical History  Procedure Laterality Date  . Eye surgery    . 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 Tee without cardioversion N/A 03/23/2014    Procedure: TRANSESOPHAGEAL ECHOCARDIOGRAM (TEE);  Surgeon: Dorothy Spark, MD;  Location: [facility];  Service: Cardiovascular;  Laterality: N/A;    Allergies: Erythromycin; Iodine; and Penicillins  Medications: Prior to Admission medications   Medication Sig Start Date End Date Taking? Authorizing Provider  acetaminophen (TYLENOL) 325 MG tablet Take 650 mg by mouth every 6 (six) hours as needed for mild pain.   Yes Historical Provider, MD  albuterol (PROVENTIL) (2.5 MG/3ML) 0.083% nebulizer solution Take 2.5 mg by nebulization every 6 (six) hours as needed for wheezing or shortness of breath.   Yes Historical Provider, MD  atorvastatin (LIPITOR) 40 MG tablet Take 40 mg by mouth daily.   Yes Historical Provider, MD  budesonide-formoterol (SYMBICORT) 80-4.5 MCG/ACT inhaler Inhale 2 puffs into the lungs 2 (two) times daily.   Yes Historical Provider, MD  carvedilol (COREG) 25 MG tablet Take 25 mg by mouth 2 (two) times daily with a meal.   Yes Historical Provider, MD   cloNIDine (CATAPRES - DOSED IN MG/24 HR) 0.3 mg/24hr patch Place 0.3 mg onto the skin once a week.   Yes Historical Provider, MD  cloNIDine (CATAPRES) 0.1 MG tablet Take 0.1 mg by mouth 3 (three) times daily.   Yes Historical Provider, MD  clopidogrel (PLAVIX) 75 MG tablet Take 75 mg by mouth daily.   Yes Historical Provider, MD  hydrALAZINE (APRESOLINE) 100 MG tablet Take 100 mg by mouth 3 (three) times daily.   Yes Historical Provider, MD  insulin aspart (NOVOLOG) 100 UNIT/ML injection Inject 0-15 Units into the skin 3 (three) times daily before meals. *70-120=0 units, 121-150=3 units, 151-200=4 units, 201-250=7 units, 251-300=11 units, 301-350=15 units, greater than 400 call MD*   Yes Historical Provider, MD  insulin glargine (LANTUS) 100 UNIT/ML injection Inject 13 Units into the skin at bedtime.   Yes Historical Provider, MD  isosorbide dinitrate (ISORDIL) 10 MG tablet Take 10 mg by mouth 3 (three) times daily.   Yes Historical Provider, MD  levothyroxine (SYNTHROID, LEVOTHROID) 50 MCG tablet Take 50 mcg by mouth daily before breakfast.   Yes Historical Provider, MD    Family History  Problem Relation Age of Onset  . Diabetes Mother   . Hypertension Mother     History   Social History  . Marital Status: Single    Spouse Name: N/A    Number of Children: N/A  . Years of Education: N/A   Social History Main Topics  . Smoking status: Former Smoker -- 0.50 packs/day  for 60 years    Types: Cigarettes    Quit date: 06/09/2014  . Smokeless tobacco: Never Used     Comment: pt states "sometimes I quit smoking for about 2 weeks then I start back"  . Alcohol Use: Yes     Comment: occasionally  . Drug Use: No  . Sexual Activity: None   Other Topics Concern  . None   Social History Narrative  . None    Review of Systems  unable to be obtained secondary to patient's lethargy.   Vital Signs: BP 165/75  Pulse 85  Temp(Src) 98.7 F (37.1 C) (Axillary)  Resp 20  Ht '5\' 3"'  (1.6 m)   Wt 167 lb 8.8 oz (76 kg)  BMI 29.69 kg/m2  SpO2 99%  Physical Exam General: Lethargic, NAD, does not respond to questioning.  Heart: RRR without M/G/R Lungs: CTA bilaterally Abd: Soft, ND, (+) BS  Imaging: Ct Head Wo Contrast  08/06/2014   CLINICAL DATA:  Code stroke.  Facial droop.  EXAM: CT HEAD WITHOUT CONTRAST  TECHNIQUE: Contiguous axial images were obtained from the base of the skull through the vertex without intravenous contrast.  COMPARISON:  08/04/2014  FINDINGS: Diffuse cerebral atrophy. Ventricular dilatation consistent with central atrophy. Low-attenuation changes throughout the deep white matter consistent with small vessel ischemia. Old lacunar infarcts in the right caudate lobe, left thalamus, and left cerebellum. No change since previous study. No mass effect or midline shift. No abnormal extra-axial fluid collections. Gray-white matter junctions are distinct. Basal cisterns are not effaced. No evidence of acute intracranial hemorrhage. No depressed skull fractures. Small retention cyst in the sphenoid sinus. Mastoid air cells are not opacified.  IMPRESSION: No acute intracranial abnormalities. Chronic atrophy and small vessel ischemic changes with multiple old lacunar infarcts.  These results were called by telephone at the time of interpretation on 08/06/2014 at 10:52 pm to Dr. Fredia Sorrow , who verbally acknowledged these results.   Electronically Signed   By: Lucienne Capers M.D.   On: 08/06/2014 22:54   Ct Head Wo Contrast  08/04/2014   CLINICAL DATA:  Facial droop and left upper extremity paresis.  EXAM: CT HEAD WITHOUT CONTRAST  TECHNIQUE: Contiguous axial images were obtained from the base of the skull through the vertex without intravenous contrast.  COMPARISON:  CT scan of July 24, 2014.  FINDINGS: Bony calvarium appears intact. Mild diffuse cortical atrophy is noted. Moderate chronic ischemic white matter disease is noted. Stable calcifications are noted in the  basal ganglia bilaterally. No mass effect or midline shift is noted. Ventricular size is within normal limits. There is no evidence of mass lesion, hemorrhage or acute infarction.  IMPRESSION: Mild diffuse cortical atrophy. Moderate chronic ischemic white matter disease. No acute intracranial abnormality seen.   Electronically Signed   By: Sabino Dick M.D.   On: 08/04/2014 14:02   Ct Head Wo Contrast  07/24/2014   CLINICAL DATA:  Elevated blood glucose, hypertensive, mental status change  EXAM: CT HEAD WITHOUT CONTRAST  TECHNIQUE: Contiguous axial images were obtained from the base of the skull through the vertex without intravenous contrast.  COMPARISON:  06/12/2014  FINDINGS: There is no evidence of mass effect, midline shift, or extra-axial fluid collections. There is no evidence of a space-occupying lesion or intracranial hemorrhage. There is no evidence of a cortical-based area of acute infarction. There is an old right basal ganglia and bilateral thalamic lacunar infarct. There is generalized cerebral atrophy. There is periventricular white matter low  attenuation likely secondary to microangiopathy.  The ventricles and sulci are appropriate for the patient's age. The basal cisterns are patent.  Visualized portions of the orbits are unremarkable. The visualized portions of the paranasal sinuses and mastoid air cells are unremarkable. Cerebrovascular atherosclerotic calcifications are noted.  The osseous structures are unremarkable.  IMPRESSION: No acute intracranial pathology.   Electronically Signed   By: Kathreen Devoid   On: 07/24/2014 11:06   Mr Jodene Nam Head Wo Contrast  08/08/2014   CLINICAL DATA:  Sudden onset of inability to speak beginning yesterday. Symptoms have now resolved. Stroke risk factors include previous stroke, diabetes, hypertension, hyperlipidemia, and chronic renal disease.  EXAM: MRI HEAD WITHOUT CONTRAST  MRA HEAD WITHOUT CONTRAST  TECHNIQUE: Multiplanar, multiecho pulse sequences of the  brain and surrounding structures were obtained without intravenous contrast. Angiographic images of the head were obtained using MRA technique without contrast.  COMPARISON:  CT head 08/06/2014.  MR head 04/24/14  FINDINGS: MRI HEAD FINDINGS  Subcentimeter acute infarct in the LEFT corpus callosum just lateral and superior to the genu. This lies within the LEFT ACA territory. No other areas of acute infarction are identified. No hemorrhage, mass lesion, or extra-axial fluid.  Hydrocephalus ex vacuo. Extensive small vessel disease throughout the periventricular and subcortical white matter. Widespread remote basal ganglia, thalamic, and deep white matter infarcts some of which were acute on the previous study from June. Partial empty sella. Cervical spondylosis. No osseous lesions. BILATERAL cataract extraction. Chronic sinus and mastoid fluid.  MRA HEAD FINDINGS  Misregistration of the process images precludes accurate stenosis evaluation. There is gross patency of the internal carotid arteries and basilar artery with RIGHT vertebral dominant. Suspected moderate RIGHT cavernous ICA disease and mild LEFT cavernous ICA disease suspected mild disease of the distal LEFT vertebral. There is no proximal flow reducing lesion of the anterior, middle, or posterior cerebral arteries. Both distal anterior cerebral arteries appear patent.  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  Advanced atrophy, small vessel disease, and widespread remote deep nuclei and deep white matter infarcts.  Patient motion results in misregistration of the MRA exam. Gross patency of the intracranial vasculature, including both anterior cerebral arteries, is established however.   Electronically Signed   By: Rolla Flatten M.D.   On: 08/08/2014 09:54   Mr Brain Wo Contrast  08/08/2014   CLINICAL DATA:  Sudden onset of inability to speak beginning yesterday. Symptoms have now resolved. Stroke risk factors  include previous stroke, diabetes, hypertension, hyperlipidemia, and chronic renal disease.  EXAM: MRI HEAD WITHOUT CONTRAST  MRA HEAD WITHOUT CONTRAST  TECHNIQUE: Multiplanar, multiecho pulse sequences of the brain and surrounding structures were obtained without intravenous contrast. Angiographic images of the head were obtained using MRA technique without contrast.  COMPARISON:  CT head 08/06/2014.  MR head 04/24/14  FINDINGS: MRI HEAD FINDINGS  Subcentimeter acute infarct in the LEFT corpus callosum just lateral and superior to the genu. This lies within the LEFT ACA territory. No other areas of acute infarction are identified. No hemorrhage, mass lesion, or extra-axial fluid.  Hydrocephalus ex vacuo. Extensive small vessel disease throughout the periventricular and subcortical white matter. Widespread remote basal ganglia, thalamic, and deep white matter infarcts some of which were acute on the previous study from June. Partial empty sella. Cervical spondylosis. No osseous lesions. BILATERAL cataract extraction. Chronic sinus and mastoid fluid.  MRA HEAD FINDINGS  Misregistration of the process images precludes accurate stenosis evaluation. There  is gross patency of the internal carotid arteries and basilar artery with RIGHT vertebral dominant. Suspected moderate RIGHT cavernous ICA disease and mild LEFT cavernous ICA disease suspected mild disease of the distal LEFT vertebral. There is no proximal flow reducing lesion of the anterior, middle, or posterior cerebral arteries. Both distal anterior cerebral arteries appear patent.  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  Advanced atrophy, small vessel disease, and widespread remote deep nuclei and deep white matter infarcts.  Patient motion results in misregistration of the MRA exam. Gross patency of the intracranial vasculature, including both anterior cerebral arteries, is established however.   Electronically  Signed   By: Rolla Flatten M.D.   On: 08/08/2014 09:54   Dg Chest Port 1 View  08/07/2014   CLINICAL DATA:  Short of breath. Labored in shallow breathing. Unresponsive.  EXAM: PORTABLE CHEST - 1 VIEW  COMPARISON:  07/29/2014  FINDINGS: Probable linear atelectasis in the left mid lung. Normal heart size and pulmonary vascularity. No focal airspace disease in the lungs. No blunting of costophrenic angles. No pneumothorax. Mediastinal contours appear intact.  IMPRESSION: No active disease.   Electronically Signed   By: Lucienne Capers M.D.   On: 08/07/2014 00:02   Dg Chest Port 1 View  07/29/2014   CLINICAL DATA:  Intubation.  EXAM: PORTABLE CHEST - 1 VIEW  COMPARISON:  07/28/2014.  FINDINGS: Endotracheal tube 1.9 cm above the carina. Left IJ line in NG tube in stable position. Cardiac monitor stable position. Heart size is stable. Pulmonary vascularity is normal. Bibasilar atelectasis and/or infiltrates are present. Small bilateral pleural effusions cannot be excluded. A mild component congestive heart failure cannot be excluded. No pneumothorax. No acute osseus abnormality.  IMPRESSION: 1. Interval reposition of endotracheal tube. Its tip is now 1.9 cm above the carina left IJ line, and NG tube in stable position. 2. Interval development of bibasilar infiltrates and possible tiny pleural effusions. Findings suggest mild CHF. Bibasilar pneumonia cannot be excluded.   Electronically Signed   By: Marcello Moores  Register   On: 07/29/2014 07:45   Dg Chest Portable 1 View  07/28/2014   CLINICAL DATA:  Endotracheal tube and orogastric tube placement  EXAM: PORTABLE CHEST - 1 VIEW  COMPARISON:  07/27/2014  FINDINGS: Endotracheal tube is seen with tip directly at the carina directed toward left main bronchus. Left internal jugular central line is seen with tip over the junction of the brachiocephalic vein and superior vena cava. There is no pneumothorax. NG tube crosses the gastroesophageal junction.  Right lung is clear.  Partial consolidation left lower lobe most consistent with partial atelectasis, improved when compared to the prior study. When compared to prior study there is improved aeration in the left lower lobe.  IMPRESSION: Endotracheal tube at the carina. Consider retracting the tube 2-3 cm and repeating the image. Continued atelectasis left lower lobe, but improved when compared to prior study. These results were called by telephone at the time of interpretation on 07/28/2014 at 2:23 am to [place], the patient's charge nurse, who verbally acknowledged these results.   Electronically Signed   By: Skipper Cliche M.D.   On: 07/28/2014 02:23   Dg Chest Port 1 View  07/27/2014   CLINICAL DATA:  Pneumonitis.  EXAM: PORTABLE CHEST - 1 VIEW  COMPARISON:  07/26/2014.  FINDINGS: Endotracheal tube ends between the clavicular heads and carina. Left IJ catheter is likely stable in positioning, although there is new distortion from leftward  rotation. Orogastric tube enters the stomach at least.  There is no change in heart size and mediastinal contours when accounting for rotation. Implantable cardiac monitor has a stable orientation.  Worsening basilar lung aeration. Hazy appearance favors atelectasis and possibly small pleural effusion. There is no edema or pneumothorax.  IMPRESSION: 1. Stable positioning of tubes and central line. 2. Significant improvement and left upper lobe aeration. Rapid clearing favors pneumonitis or atelectasis over pneumonia. 3. New bibasilar atelectasis.   Electronically Signed   By: Jorje Guild M.D.   On: 07/27/2014 06:25   Dg Chest Port 1 View  07/26/2014   CLINICAL DATA:  Ventilator dependent respiratory failure. Followup left upper lobe pneumonia.  EXAM: PORTABLE CHEST - 1 VIEW  COMPARISON:  Portable chest x-rays yesterday, 07/24/2014, 06/13/2014.  FINDINGS: Endotracheal tube tip in satisfactory position projecting approximately 4-5 cm above the carina. Nasogastric tube courses below the diaphragm  into the stomach. Left jugular sent venous catheter tip projects over the upper SVC, unchanged.  Cardiac silhouette upper normal in size to slightly enlarged but stable. Airspace consolidation in the left upper lobe, slightly worse than on yesterday's examination. No new pulmonary parenchymal abnormalities.  IMPRESSION: Support apparatus satisfactory. Slight worsening of left upper lobe pneumonia since yesterday. No new abnormalities.   Electronically Signed   By: Evangeline Dakin M.D.   On: 07/26/2014 09:10   Portable Chest Xray In Am  07/25/2014   CLINICAL DATA:  Follow-up endotracheal tube position.  EXAM: PORTABLE CHEST - 1 VIEW  COMPARISON:  07/24/2014  FINDINGS: Endotracheal tube remains in place with tip approximately 2-2.5 cm above the carina. Left jugular central venous catheter is unchanged with tip near the brachiocephalic vein confluence. Enteric tube courses into the left upper abdomen. Loop recorder is again seen. Cardiac silhouette is within normal limits for size. Left perihilar opacity has decreased from the prior study. Right lung remains clear. No definite pleural effusion or pneumothorax is identified.  IMPRESSION: 1. Endotracheal tube 2-2.5 cm above the carina. 2. Improved left upper lobe aeration.   Electronically Signed   By: [name]   On: 07/25/2014 09:13   Portable Chest Xray  07/24/2014   CLINICAL DATA:  Interval intubation of the patient ; assess support tube and line. Placement  EXAM: PORTABLE CHEST - 1 VIEW  COMPARISON:  Portable chest x-ray of today's date.  FINDINGS: The endotracheal tube tip lies 2 cm above the crotch of the carina. The right internal jugular venous catheter tip lies at the level of the junction of the right and left brachiocephalic veins. The esophagogastric tube tip projects below the inferior margin of the image. A loop recorder is visible over the left atrial region. The cardiac silhouette is normal in size. The pulmonary vascularity is not engorged.  There is confluent alveolar opacity in the left perihilar region that is more conspicuous than on the earlier study. The bony thorax exhibits no acute abnormality.  IMPRESSION: 1. The endotracheal tube tip lies 2.1 cm above the crotch of the carina. Withdrawal by 2-3 cm is recommended to avoid accidental mainstem bronchus intubation. 2. The left internal jugular venous catheter and the esophagogastric tube are in appropriate position radiographically. There is no postprocedure pneumothorax or hemothorax. 3. There is increased alveolar opacity in the low left perihilar region consistent with pneumonia. 4. These results were called by telephone at the time of interpretation on 07/24/2014 at 7:21 pm to [facility], RN, who verbally acknowledged these results.   Electronically Signed  By: David  Martinique   On: 07/24/2014 19:26   Dg Chest Port 1 View  07/24/2014   CLINICAL DATA:  Recent aspiration  EXAM: PORTABLE CHEST - 1 VIEW  COMPARISON:  06/13/2014  FINDINGS: The cardiac shadow is stable. Monitoring device is again seen. The right lung is clear. The left lung demonstrates patchy infiltrate in the upper lobe which may be related to the patient's recent aspiration.  IMPRESSION: Left upper lobe infiltrate. Followup films to resolution are recommended.   Electronically Signed   By: Inez Catalina M.D.   On: 07/24/2014 11:09   Dg Abd Portable 1v  07/30/2014   CLINICAL DATA:  Feeding tube placement  EXAM: PORTABLE ABDOMEN - 1 VIEW  COMPARISON:  Portable exam 1642 hr compared to 07/28/2014  FINDINGS: Feeding tube coiled at mid stomach with tip reflected to fundus.  Nonobstructive bowel gas pattern.  Minimal atelectasis and questionable fusions at lung bases.  Loop recorder projects over chest.  IMPRESSION: Feeding tube coiled at mid to proximal stomach.   Electronically Signed   By: Lavonia Dana M.D.   On: 07/30/2014 16:55   Dg Abd Portable 1v  07/28/2014   CLINICAL DATA:  Endotracheal tube and orogastric tube  placement  EXAM: PORTABLE ABDOMEN - 1 VIEW  COMPARISON:  None.  FINDINGS: Orogastric tube crosses the gastroesophageal junction with tip in the left upper quadrant in the anticipated position of stomach. Endotracheal tube is also seen on this study with tip about 12 mm above the carinal.  IMPRESSION: Endotracheal tube and OG tube as described.   Electronically Signed   By: Skipper Cliche M.D.   On: 07/28/2014 02:24   Dg Swallowing Func-speech Pathology  08/01/2014   Katherene Ponto Deblois, CCC-SLP     08/01/2014  1:43 PM Objective Swallowing Evaluation: Modified Barium Swallowing Study   Patient Details  Name: NAKESHA EBRAHIM MRN: [id number] Date of Birth: [birth year]  Today's Date: 08/01/2014 Time: 1300-1330 SLP Time Calculation (min): 30 min  Past Medical History:  Past Medical History  Diagnosis Date  . Hypertension   . Diabetes mellitus   . Chronic renal insufficiency   . Hyperlipidemia   . Osteopenia   . CVA (cerebral infarction)   . Thyroid disease    Past Surgical History:  Past Surgical History  Procedure Laterality Date  . Eye surgery    . 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 Tee without cardioversion N/A 03/23/2014    Procedure: TRANSESOPHAGEAL ECHOCARDIOGRAM (TEE);  Surgeon:  Dorothy Spark, MD;  Location: [facility] ENDOSCOPY;  Service:  Cardiovascular;  Laterality: N/A;   HPI:  [name] with COPD, CVA, CK D., Diabetes admitted 9/18  with acute respiratory distress , hypercarbic respiratory  failure, DKA with glucose of 802 and anion gap of 19 , requiring  mechanical ventilation. Chest x-ray showed left upper lobe  infiltrate. Was placed on BiPAP initially, but unresponsive on  arrival to the ICU, hence intubated. She had recent admit from  8/6 to 814 for hypercarbic failure that improved with BiPAP-dc'd  to NH , then home since 9/14. 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 Patient has been seen by SLP  during previous  admissions and has been observed with a delayed  oral phase and suspected delayed swallow initiation with s/s of  aspiration with thin liquids via straw. Most frequent diet  recommendations for dysphagia 1 solids, thin liquids.  Assessment / Plan / Recommendation Clinical Impression  Dysphagia Diagnosis: Moderate oral phase dysphagia;Mild  pharyngeal phase dysphagia Clinical impression: Pt presents with a primary cognitive based,  oral dyspahgia with lingual pumping and piecemeal transit of PO,  leading to multiple swallows per bolus. Swallow response is only  slightly delayed but mild base of tongue weakness results in  trace vallecular residuals and very trace penetration after the  swallow with nectar thick liquids. Pt senses penetrate and expels  with a soft throat clear. Given overt aspiration of thin at  bedside and pts AMS, thin liquids not tested though expect pt  will be able to upgrade as mentation improves. Pt to initiate dys  1 (puree) diet and nectar thick liquids. SLP will follow for  tolerance.      Treatment Recommendation  Therapy as outlined in treatment plan below    Diet Recommendation Dysphagia 1 (Puree);Nectar-thick liquid   Liquid Administration via: Cup;Straw Medication Administration: Whole meds with puree Supervision: Staff to assist with self feeding Compensations: Slow rate;Small sips/bites Postural Changes and/or Swallow Maneuvers: Seated upright 90  degrees;Upright 30-60 min after meal    Other  Recommendations Oral Care Recommendations: Oral care BID Other Recommendations: Order thickener from pharmacy   Follow Up Recommendations  Skilled Nursing facility    Frequency and Duration min 2x/week  2 weeks   Pertinent Vitals/Pain NA    SLP Swallow Goals     General HPI: [name] with COPD, CVA, CK D., Diabetes  admitted 9/18 with acute respiratory distress , hypercarbic  respiratory failure, DKA with glucose of 802 and anion gap of 19  , requiring mechanical ventilation. Chest  x-ray showed left upper  lobe infiltrate. Was placed on BiPAP initially, but unresponsive  on arrival to the ICU, hence intubated. She had recent admit from  8/6 to 814 for hypercarbic failure that improved with BiPAP-dc'd  to NH , then home since 9/14. 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 Patient has been seen by SLP  during previous admissions and has been observed with a delayed  oral phase and suspected delayed swallow initiation with s/s of  aspiration with thin liquids via straw. Most frequent diet  recommendations for dysphagia 1 solids, thin liquids.  Type of Study: Modified Barium Swallowing Study Reason for Referral: Objectively evaluate swallowing function Previous Swallow Assessment: see HPI Diet Prior to this Study: NPO Temperature Spikes Noted: No Respiratory Status: Room air History of Recent Intubation: Yes Length of Intubations (days): 2 days Date extubated: 07/29/14 Behavior/Cognition: Alert;Cooperative;Requires cueing Oral Cavity - Dentition: Edentulous Oral Motor / Sensory Function: Within functional limits Self-Feeding Abilities: Total assist Patient Positioning: Upright in chair Baseline Vocal Quality: Clear Volitional Cough: Weak Volitional Swallow: Able to elicit Pharyngeal Secretions: Not observed secondary MBS    Reason for Referral Objectively evaluate swallowing function   Oral Phase Oral Preparation/Oral Phase Oral Phase: Impaired Oral - Nectar Oral - Nectar Cup: Lingual pumping;Reduced posterior  propulsion;Piecemeal swallowing;Delayed oral transit Oral - Nectar Straw: Lingual pumping;Reduced posterior  propulsion;Piecemeal swallowing;Delayed oral transit Oral - Solids Oral - Puree: Lingual pumping;Reduced posterior  propulsion;Piecemeal swallowing;Delayed oral transit   Pharyngeal Phase Pharyngeal Phase Pharyngeal Phase: Impaired Pharyngeal - Nectar Pharyngeal - Nectar Cup: Reduced tongue base  retraction;Penetration/Aspiration after swallow;Pharyngeal   residue - valleculae Penetration/Aspiration details (nectar cup): Material enters  airway, CONTACTS cords then ejected out;Material does not enter  airway Pharyngeal - Nectar Straw: Reduced tongue base  retraction;Penetration/Aspiration after swallow;Pharyngeal  residue -  valleculae Penetration/Aspiration details (nectar straw): Material does not  enter airway;Material enters airway, CONTACTS cords then ejected  out Pharyngeal - Solids Pharyngeal - Puree: Reduced tongue base retraction;Pharyngeal  residue - valleculae Penetration/Aspiration details (puree): Material does not enter  airway  Cervical Esophageal Phase    GO    Cervical Esophageal Phase Cervical Esophageal Phase: Impaired Cervical Esophageal Phase - Comment Cervical Esophageal Comment: Appearance of slow transit, stasis  through GE junction        [employer], MA CCC-SLP [phone]  [name] 08/01/2014, 1:42 PM     Labs:  CBC:  Recent Labs  08/02/14 0300 08/04/14 0330 08/06/14 2230 08/06/14 2245 08/07/14 0726  WBC 7.3 6.0 9.4  --  9.6  HGB 10.5* 9.6* 9.1* 9.2* 9.5*  HCT 31.1* 29.1* 28.0* 27.0* 29.8*  PLT 261 300 290  --  298    COAGS:  Recent Labs  01/09/14 2016 03/20/14 2156 06/12/14 1134 07/24/14 1000 08/06/14 2230 08/07/14 0726  INR 1.01 1.03 1.28 1.14 1.11 1.17  APTT '24 30 30  ' --  23*  --     BMP:  Recent Labs  08/04/14 0330 08/06/14 2230 08/06/14 2245 08/07/14 0726 08/11/14 0004  NA 144 144 144 147 148*  K 3.4* 4.0 3.9 4.6 5.5*  CL 106 107 110 110 111  CO2 24 27  --  27 23  GLUCOSE 92 181* 177* 137* 184*  BUN '17 11 9 11 10  ' CALCIUM 8.1* 8.3*  --  8.4 8.7  CREATININE 1.33* 1.27* 1.20* 1.28* 1.04  GFRNONAA 38* 40*  --  40* 51*  GFRAA 44* 47*  --  46* 59*    LIVER FUNCTION TESTS:  Recent Labs  07/25/14 0525 08/04/14 0330 08/06/14 2230 08/07/14 0726  BILITOT 0.3 <0.2* 0.2* <0.2*  AST 12 40* 21 20  ALT '13 29 16 16  ' ALKPHOS 62 71 67 68  PROT 5.7* 5.8* 5.9* 5.9*  ALBUMIN  2.6* 2.3* 2.3* 2.3*    TUMOR MARKERS: No results found for this basename: AFPTM, CEA, CA199, CHROMGRNA,  in the last 8760 hours  Assessment and Plan: CVA, previously on plavix d/c'd 10/3 and then switched to Aggrenox, currently all on hold. Dysphagia Request for image guided percutaneous gastrostomy tube placement with moderate sedation. Dr. Kathlene Cote has reviewed the patient's KUB and CT abdomen has been ordered for further evaluation prior to G-tube, patient wbc wnl, afebrile, no blood thinners, sq heparin held for possible 10/8 procedure. Consent will need to be obtained by family.  COPD CKD DM type 2   Thank you for this interesting consult.  I greatly enjoyed meeting [name] [name] and look forward to participating in their care.   I spent a total of 40 minutes face to face in clinical consultation, greater than 50% of which was counseling/coordinating care  Signed: Hedy Jacob 08/12/2014, 1:33 PM

## 2014-08-12 NOTE — Progress Notes (Signed)
UR complete.  Endre Coutts RN, MSN 

## 2014-08-12 NOTE — Progress Notes (Signed)
Attempted PIV access X2 using ultrasound without success.   Able to visualize vein however unable to thread catheters.

## 2014-08-12 NOTE — Progress Notes (Signed)
Chaplain introduced herself to pt sister and made her aware of chaplain services. Will follow as needed.  08/12/14 1500  Clinical Encounter Type  Visited With Family;Health care provider  Visit Type Initial  Referral From Nurse  Jiles HaroldStamey, Roxine Whittinghill F, Chaplain 08/12/2014 3:45 PM

## 2014-08-13 ENCOUNTER — Inpatient Hospital Stay (HOSPITAL_COMMUNITY): Payer: Medicare HMO

## 2014-08-13 DIAGNOSIS — E860 Dehydration: Secondary | ICD-10-CM

## 2014-08-13 DIAGNOSIS — J439 Emphysema, unspecified: Secondary | ICD-10-CM

## 2014-08-13 LAB — GLUCOSE, CAPILLARY
GLUCOSE-CAPILLARY: 168 mg/dL — AB (ref 70–99)
Glucose-Capillary: 153 mg/dL — ABNORMAL HIGH (ref 70–99)
Glucose-Capillary: 162 mg/dL — ABNORMAL HIGH (ref 70–99)
Glucose-Capillary: 178 mg/dL — ABNORMAL HIGH (ref 70–99)
Glucose-Capillary: 184 mg/dL — ABNORMAL HIGH (ref 70–99)
Glucose-Capillary: 189 mg/dL — ABNORMAL HIGH (ref 70–99)

## 2014-08-13 LAB — CBC
HEMATOCRIT: 31.3 % — AB (ref 36.0–46.0)
Hemoglobin: 9.8 g/dL — ABNORMAL LOW (ref 12.0–15.0)
MCH: 28.8 pg (ref 26.0–34.0)
MCHC: 31.3 g/dL (ref 30.0–36.0)
MCV: 92.1 fL (ref 78.0–100.0)
PLATELETS: 265 10*3/uL (ref 150–400)
RBC: 3.4 MIL/uL — ABNORMAL LOW (ref 3.87–5.11)
RDW: 13.4 % (ref 11.5–15.5)
WBC: 4.6 10*3/uL (ref 4.0–10.5)

## 2014-08-13 LAB — BASIC METABOLIC PANEL
ANION GAP: 12 (ref 5–15)
BUN: 12 mg/dL (ref 6–23)
CALCIUM: 8.6 mg/dL (ref 8.4–10.5)
CO2: 23 meq/L (ref 19–32)
Chloride: 114 mEq/L — ABNORMAL HIGH (ref 96–112)
Creatinine, Ser: 1.17 mg/dL — ABNORMAL HIGH (ref 0.50–1.10)
GFR, EST AFRICAN AMERICAN: 51 mL/min — AB (ref >90)
GFR, EST NON AFRICAN AMERICAN: 44 mL/min — AB (ref >90)
Glucose, Bld: 181 mg/dL — ABNORMAL HIGH (ref 70–99)
Potassium: 4.5 mEq/L (ref 3.7–5.3)
SODIUM: 149 meq/L — AB (ref 137–147)

## 2014-08-13 MED ORDER — LIDOCAINE HCL 1 % IJ SOLN
INTRAMUSCULAR | Status: AC
  Filled 2014-08-13: qty 20

## 2014-08-13 MED ORDER — MIDAZOLAM HCL 2 MG/2ML IJ SOLN
INTRAMUSCULAR | Status: AC
  Filled 2014-08-13: qty 2

## 2014-08-13 MED ORDER — HYDRALAZINE HCL 20 MG/ML IJ SOLN
INTRAMUSCULAR | Status: AC
  Administered 2014-08-13: 10 mg via INTRAVENOUS
  Filled 2014-08-13: qty 1

## 2014-08-13 MED ORDER — FENTANYL CITRATE 0.05 MG/ML IJ SOLN
INTRAMUSCULAR | Status: AC
  Filled 2014-08-13: qty 2

## 2014-08-13 MED ORDER — SODIUM CHLORIDE 0.9 % IJ SOLN
10.0000 mL | INTRAMUSCULAR | Status: DC | PRN
  Administered 2014-08-13 – 2014-08-15 (×4): 10 mL
  Administered 2014-08-17 – 2014-08-18 (×3): 20 mL
  Administered 2014-08-19 (×2): 10 mL
  Administered 2014-08-19: 30 mL
  Administered 2014-08-20: 20 mL

## 2014-08-13 NOTE — Procedures (Signed)
Successful placement of right brachial vein approach 36 cm dual lumen PICC line with tip at the superior caval-atrial junction.  The PICC line is ready for immediate use. No percutaneous window for gastrostomy tube placement secondary to pt's overlying colon.  Patient may be referred to surgery for gastrostomy tube placement as clinically indicated.

## 2014-08-13 NOTE — Progress Notes (Signed)
RT placed OG in pt in IR room 1 in order for Dr. Grace IsaacWatts to view for possible GT placement. Mitts in place bilaterally upon arrival here. Pt w/out IV access.  Held hand during procedure but pt gagged and was trying to reach for oral tube..Marland Kitchen

## 2014-08-13 NOTE — Progress Notes (Signed)
Barium enema given per Dr[facility]. Pt tolerating well.

## 2014-08-13 NOTE — H&P (Signed)
Agree 

## 2014-08-13 NOTE — Progress Notes (Signed)
Dr. Grace IsaacWatts to contact attending to see if they would like a piccline placed.  Bp elevated after oral tube placed.

## 2014-08-13 NOTE — Progress Notes (Addendum)
Patient ID: KIMBERY HARWOOD  female  UUV:[id number]    DOB: 29-Sep-1941    DOA: 08/06/2014  PCP: Alva Garnet., MD  Brief narrative:  Patient is a 73 year old with prior CVAs, hypertension, DM, hypothyroidism who presented to the hospital secondary to inability to speak. Upon further evaluation was found to have a new stroke. The patient has failed bedside swallow evaluation and after discussion with POA, palliative meeting, requested PEG tube placement as part of their goals of care.   Assessment/Plan: Principal Problem:   CVA (cerebral infarction) - MRI Acute LEFT corpus callosum infarct just superior and lateral to the genu - MRA no large vessel occlusion - Carotid Dopplers showed no significance stenosis, 2-D echo unremarkable - TEE in 03/2014 showed PFO, so DVT screening done which was negative. - Patient was on Plavix 75 mg daily prior to admission, switched to Aggrenox by neurology - Failed swallow evaluation, awaiting PEG tube placement-> IR unable to place, recommended surgery referral for gastrotomy tube - PT recommended skilled nursing facility  Active Problems:   DM type 2, uncontrolled, with renal complications - Continue CBGs every 4 hours, continue sliding scale insulin    CKD (chronic kidney disease), stage III - Currently stable    Hypothyroidism - Change to IV Synthroid    COPD (chronic obstructive pulmonary disease) - Currently stable   Dysphagia, pharyngoesophageal phase - Awaiting PEG tube placement, IR was unable to place PEG tube today, will d/w GI or CCS   DVT Prophylaxis:  Code Status:  Family Communication: called patient's son, Mr Uvaldo Rising, updated  Disposition: PEG tube placement pending, after PEG tube is functional, will transition to SNF  Consultants:  Neurology  Palliative care   Antibiotics:  None    Subjective: Aphasia, nonverbal, does not cooperate with exam however today she is much more alert and  awake  Objective: Weight change:  No intake or output data in the 24 hours ending 08/13/14 1253 Blood pressure 151/79, pulse 81, temperature 97.9 F (36.6 C), temperature source Oral, resp. rate 20, height 5\' 3"  (1.6 m), weight 76 kg (167 lb 8.8 oz), SpO2 100.00%.  Physical Exam: General: Alert and awake awake, NAD  CVS: S1-S2 clear, no murmur rubs or gallops Chest: CTAB Abdomen: soft nontender, nondistended, normal bowel sounds  Extremities: no cyanosis, clubbing or edema noted bilaterally Neuro: Does not cooperate with exam  Lab Results: Basic Metabolic Panel:  Recent Labs Lab 08/11/14 0004 08/13/14 0830  NA 148* 149*  K 5.5* 4.5  CL 111 114*  CO2 23 23  GLUCOSE 184* 181*  BUN 10 12  CREATININE 1.04 1.17*  CALCIUM 8.7 8.6   Liver Function Tests:  Recent Labs Lab 08/06/14 2230 08/07/14 0726  AST 21 20  ALT 16 16  ALKPHOS 67 68  BILITOT 0.2* <0.2*  PROT 5.9* 5.9*  ALBUMIN 2.3* 2.3*   No results found for this basename: LIPASE, AMYLASE,  in the last 168 hours No results found for this basename: AMMONIA,  in the last 168 hours CBC:  Recent Labs Lab 08/07/14 0726 08/13/14 0830  WBC 9.6 4.6  NEUTROABS 6.7  --   HGB 9.5* 9.8*  HCT 29.8* 31.3*  MCV 93.7 92.1  PLT 298 265   Cardiac Enzymes: No results found for this basename: CKTOTAL, CKMB, CKMBINDEX, TROPONINI,  in the last 168 hours BNP: No components found with this basename: POCBNP,  CBG:  Recent Labs Lab 08/12/14 2040 08/13/14 0019 08/13/14 0405 08/13/14 0839 08/13/14 1151  GLUCAP  149* 153* 162* 168* 178*     Micro Results: No results found for this or any previous visit (from the past 240 hour(s)).  Studies/Results: Ct Head Wo Contrast  08/06/2014   CLINICAL DATA:  Code stroke.  Facial droop.  EXAM: CT HEAD WITHOUT CONTRAST  TECHNIQUE: Contiguous axial images were obtained from the base of the skull through the vertex without intravenous contrast.  COMPARISON:  08/04/2014  FINDINGS:  Diffuse cerebral atrophy. Ventricular dilatation consistent with central atrophy. Low-attenuation changes throughout the deep white matter consistent with small vessel ischemia. Old lacunar infarcts in the right caudate lobe, left thalamus, and left cerebellum. No change since previous study. No mass effect or midline shift. No abnormal extra-axial fluid collections. Gray-white matter junctions are distinct. Basal cisterns are not effaced. No evidence of acute intracranial hemorrhage. No depressed skull fractures. Small retention cyst in the sphenoid sinus. Mastoid air cells are not opacified.  IMPRESSION: No acute intracranial abnormalities. Chronic atrophy and small vessel ischemic changes with multiple old lacunar infarcts.  These results were called by telephone at the time of interpretation on 08/06/2014 at 10:52 pm to Dr. Vanetta Mulders , who verbally acknowledged these results.   Electronically Signed   By: Burman Nieves M.D.   On: 08/06/2014 22:54   Ct Head Wo Contrast  08/04/2014   CLINICAL DATA:  Facial droop and left upper extremity paresis.  EXAM: CT HEAD WITHOUT CONTRAST  TECHNIQUE: Contiguous axial images were obtained from the base of the skull through the vertex without intravenous contrast.  COMPARISON:  CT scan of July 24, 2014.  FINDINGS: Bony calvarium appears intact. Mild diffuse cortical atrophy is noted. Moderate chronic ischemic white matter disease is noted. Stable calcifications are noted in the basal ganglia bilaterally. No mass effect or midline shift is noted. Ventricular size is within normal limits. There is no evidence of mass lesion, hemorrhage or acute infarction.  IMPRESSION: Mild diffuse cortical atrophy. Moderate chronic ischemic white matter disease. No acute intracranial abnormality seen.   Electronically Signed   By: Roque Lias M.D.   On: 08/04/2014 14:02   Ct Head Wo Contrast  07/24/2014   CLINICAL DATA:  Elevated blood glucose, hypertensive, mental status  change  EXAM: CT HEAD WITHOUT CONTRAST  TECHNIQUE: Contiguous axial images were obtained from the base of the skull through the vertex without intravenous contrast.  COMPARISON:  06/12/2014  FINDINGS: There is no evidence of mass effect, midline shift, or extra-axial fluid collections. There is no evidence of a space-occupying lesion or intracranial hemorrhage. There is no evidence of a cortical-based area of acute infarction. There is an old right basal ganglia and bilateral thalamic lacunar infarct. There is generalized cerebral atrophy. There is periventricular white matter low attenuation likely secondary to microangiopathy.  The ventricles and sulci are appropriate for the patient's age. The basal cisterns are patent.  Visualized portions of the orbits are unremarkable. The visualized portions of the paranasal sinuses and mastoid air cells are unremarkable. Cerebrovascular atherosclerotic calcifications are noted.  The osseous structures are unremarkable.  IMPRESSION: No acute intracranial pathology.   Electronically Signed   By: Elige Ko   On: 07/24/2014 11:06   Mr Maxine Glenn Head Wo Contrast  08/08/2014   CLINICAL DATA:  Sudden onset of inability to speak beginning yesterday. Symptoms have now resolved. Stroke risk factors include previous stroke, diabetes, hypertension, hyperlipidemia, and chronic renal disease.  EXAM: MRI HEAD WITHOUT CONTRAST  MRA HEAD WITHOUT CONTRAST  TECHNIQUE: Multiplanar, multiecho pulse sequences  of the brain and surrounding structures were obtained without intravenous contrast. Angiographic images of the head were obtained using MRA technique without contrast.  COMPARISON:  CT head 08/06/2014.  MR head 04/24/14  FINDINGS: MRI HEAD FINDINGS  Subcentimeter acute infarct in the LEFT corpus callosum just lateral and superior to the genu. This lies within the LEFT ACA territory. No other areas of acute infarction are identified. No hemorrhage, mass lesion, or extra-axial fluid.   Hydrocephalus ex vacuo. Extensive small vessel disease throughout the periventricular and subcortical white matter. Widespread remote basal ganglia, thalamic, and deep white matter infarcts some of which were acute on the previous study from June. Partial empty sella. Cervical spondylosis. No osseous lesions. BILATERAL cataract extraction. Chronic sinus and mastoid fluid.  MRA HEAD FINDINGS  Misregistration of the process images precludes accurate stenosis evaluation. There is gross patency of the internal carotid arteries and basilar artery with RIGHT vertebral dominant. Suspected moderate RIGHT cavernous ICA disease and mild LEFT cavernous ICA disease suspected mild disease of the distal LEFT vertebral. There is no proximal flow reducing lesion of the anterior, middle, or posterior cerebral arteries. Both distal anterior cerebral arteries appear patent.  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  Advanced atrophy, small vessel disease, and widespread remote deep nuclei and deep white matter infarcts.  Patient motion results in misregistration of the MRA exam. Gross patency of the intracranial vasculature, including both anterior cerebral arteries, is established however.   Electronically Signed   By: Davonna Belling M.D.   On: 08/08/2014 09:54   Mr Brain Wo Contrast  08/08/2014   CLINICAL DATA:  Sudden onset of inability to speak beginning yesterday. Symptoms have now resolved. Stroke risk factors include previous stroke, diabetes, hypertension, hyperlipidemia, and chronic renal disease.  EXAM: MRI HEAD WITHOUT CONTRAST  MRA HEAD WITHOUT CONTRAST  TECHNIQUE: Multiplanar, multiecho pulse sequences of the brain and surrounding structures were obtained without intravenous contrast. Angiographic images of the head were obtained using MRA technique without contrast.  COMPARISON:  CT head 08/06/2014.  MR head 04/24/14  FINDINGS: MRI HEAD FINDINGS  Subcentimeter acute infarct  in the LEFT corpus callosum just lateral and superior to the genu. This lies within the LEFT ACA territory. No other areas of acute infarction are identified. No hemorrhage, mass lesion, or extra-axial fluid.  Hydrocephalus ex vacuo. Extensive small vessel disease throughout the periventricular and subcortical white matter. Widespread remote basal ganglia, thalamic, and deep white matter infarcts some of which were acute on the previous study from June. Partial empty sella. Cervical spondylosis. No osseous lesions. BILATERAL cataract extraction. Chronic sinus and mastoid fluid.  MRA HEAD FINDINGS  Misregistration of the process images precludes accurate stenosis evaluation. There is gross patency of the internal carotid arteries and basilar artery with RIGHT vertebral dominant. Suspected moderate RIGHT cavernous ICA disease and mild LEFT cavernous ICA disease suspected mild disease of the distal LEFT vertebral. There is no proximal flow reducing lesion of the anterior, middle, or posterior cerebral arteries. Both distal anterior cerebral arteries appear patent.  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  Advanced atrophy, small vessel disease, and widespread remote deep nuclei and deep white matter infarcts.  Patient motion results in misregistration of the MRA exam. Gross patency of the intracranial vasculature, including both anterior cerebral arteries, is established however.   Electronically Signed   By: Davonna Belling M.D.   On: 08/08/2014 09:54   Dg  Chest Port 1 View  08/07/2014   CLINICAL DATA:  Short of breath. Labored in shallow breathing. Unresponsive.  EXAM: PORTABLE CHEST - 1 VIEW  COMPARISON:  07/29/2014  FINDINGS: Probable linear atelectasis in the left mid lung. Normal heart size and pulmonary vascularity. No focal airspace disease in the lungs. No blunting of costophrenic angles. No pneumothorax. Mediastinal contours appear intact.  IMPRESSION: No active  disease.   Electronically Signed   By: Burman Nieves M.D.   On: 08/07/2014 00:02   Dg Chest Port 1 View  07/29/2014   CLINICAL DATA:  Intubation.  EXAM: PORTABLE CHEST - 1 VIEW  COMPARISON:  07/28/2014.  FINDINGS: Endotracheal tube 1.9 cm above the carina. Left IJ line in NG tube in stable position. Cardiac monitor stable position. Heart size is stable. Pulmonary vascularity is normal. Bibasilar atelectasis and/or infiltrates are present. Small bilateral pleural effusions cannot be excluded. A mild component congestive heart failure cannot be excluded. No pneumothorax. No acute osseus abnormality.  IMPRESSION: 1. Interval reposition of endotracheal tube. Its tip is now 1.9 cm above the carina left IJ line, and NG tube in stable position. 2. Interval development of bibasilar infiltrates and possible tiny pleural effusions. Findings suggest mild CHF. Bibasilar pneumonia cannot be excluded.   Electronically Signed   By: Maisie Fus  Register   On: 07/29/2014 07:45   Dg Chest Portable 1 View  07/28/2014   CLINICAL DATA:  Endotracheal tube and orogastric tube placement  EXAM: PORTABLE CHEST - 1 VIEW  COMPARISON:  07/27/2014  FINDINGS: Endotracheal tube is seen with tip directly at the carina directed toward left main bronchus. Left internal jugular central line is seen with tip over the junction of the brachiocephalic vein and superior vena cava. There is no pneumothorax. NG tube crosses the gastroesophageal junction.  Right lung is clear. Partial consolidation left lower lobe most consistent with partial atelectasis, improved when compared to the prior study. When compared to prior study there is improved aeration in the left lower lobe.  IMPRESSION: Endotracheal tube at the carina. Consider retracting the tube 2-3 cm and repeating the image. Continued atelectasis left lower lobe, but improved when compared to prior study. These results were called by telephone at the time of interpretation on 07/28/2014 at 2:23 am  to Joy, the patient's charge nurse, who verbally acknowledged these results.   Electronically Signed   By: Esperanza Heir M.D.   On: 07/28/2014 02:23   Dg Chest Port 1 View  07/27/2014   CLINICAL DATA:  Pneumonitis.  EXAM: PORTABLE CHEST - 1 VIEW  COMPARISON:  07/26/2014.  FINDINGS: Endotracheal tube ends between the clavicular heads and carina. Left IJ catheter is likely stable in positioning, although there is new distortion from leftward rotation. Orogastric tube enters the stomach at least.  There is no change in heart size and mediastinal contours when accounting for rotation. Implantable cardiac monitor has a stable orientation.  Worsening basilar lung aeration. Hazy appearance favors atelectasis and possibly small pleural effusion. There is no edema or pneumothorax.  IMPRESSION: 1. Stable positioning of tubes and central line. 2. Significant improvement and left upper lobe aeration. Rapid clearing favors pneumonitis or atelectasis over pneumonia. 3. New bibasilar atelectasis.   Electronically Signed   By: Tiburcio Pea M.D.   On: 07/27/2014 06:25   Dg Chest Port 1 View  07/26/2014   CLINICAL DATA:  Ventilator dependent respiratory failure. Followup left upper lobe pneumonia.  EXAM: PORTABLE CHEST - 1 VIEW  COMPARISON:  Portable  chest x-rays yesterday, 07/24/2014, 06/13/2014.  FINDINGS: Endotracheal tube tip in satisfactory position projecting approximately 4-5 cm above the carina. Nasogastric tube courses below the diaphragm into the stomach. Left jugular sent venous catheter tip projects over the upper SVC, unchanged.  Cardiac silhouette upper normal in size to slightly enlarged but stable. Airspace consolidation in the left upper lobe, slightly worse than on yesterday's examination. No new pulmonary parenchymal abnormalities.  IMPRESSION: Support apparatus satisfactory. Slight worsening of left upper lobe pneumonia since yesterday. No new abnormalities.   Electronically Signed   By: Hulan Saas M.D.   On: 07/26/2014 09:10   Portable Chest Xray In Am  07/25/2014   CLINICAL DATA:  Follow-up endotracheal tube position.  EXAM: PORTABLE CHEST - 1 VIEW  COMPARISON:  07/24/2014  FINDINGS: Endotracheal tube remains in place with tip approximately 2-2.5 cm above the carina. Left jugular central venous catheter is unchanged with tip near the brachiocephalic vein confluence. Enteric tube courses into the left upper abdomen. Loop recorder is again seen. Cardiac silhouette is within normal limits for size. Left perihilar opacity has decreased from the prior study. Right lung remains clear. No definite pleural effusion or pneumothorax is identified.  IMPRESSION: 1. Endotracheal tube 2-2.5 cm above the carina. 2. Improved left upper lobe aeration.   Electronically Signed   By: Sebastian Ache   On: 07/25/2014 09:13   Portable Chest Xray  07/24/2014   CLINICAL DATA:  Interval intubation of the patient ; assess support tube and line. Placement  EXAM: PORTABLE CHEST - 1 VIEW  COMPARISON:  Portable chest x-ray of today's date.  FINDINGS: The endotracheal tube tip lies 2 cm above the crotch of the carina. The right internal jugular venous catheter tip lies at the level of the junction of the right and left brachiocephalic veins. The esophagogastric tube tip projects below the inferior margin of the image. A loop recorder is visible over the left atrial region. The cardiac silhouette is normal in size. The pulmonary vascularity is not engorged. There is confluent alveolar opacity in the left perihilar region that is more conspicuous than on the earlier study. The bony thorax exhibits no acute abnormality.  IMPRESSION: 1. The endotracheal tube tip lies 2.1 cm above the crotch of the carina. Withdrawal by 2-3 cm is recommended to avoid accidental mainstem bronchus intubation. 2. The left internal jugular venous catheter and the esophagogastric tube are in appropriate position radiographically. There is no  postprocedure pneumothorax or hemothorax. 3. There is increased alveolar opacity in the low left perihilar region consistent with pneumonia. 4. These results were called by telephone at the time of interpretation on 07/24/2014 at 7:21 pm to [facility], RN, who verbally acknowledged these results.   Electronically Signed   By: David  [place]   On: 07/24/2014 19:26   Dg Chest Port 1 View  07/24/2014   CLINICAL DATA:  Recent aspiration  EXAM: PORTABLE CHEST - 1 VIEW  COMPARISON:  06/13/2014  FINDINGS: The cardiac shadow is stable. Monitoring device is again seen. The right lung is clear. The left lung demonstrates patchy infiltrate in the upper lobe which may be related to the patient's recent aspiration.  IMPRESSION: Left upper lobe infiltrate. Followup films to resolution are recommended.   Electronically Signed   By: Alcide Clever M.D.   On: 07/24/2014 11:09   Dg Abd Portable 1v  07/30/2014   CLINICAL DATA:  Feeding tube placement  EXAM: PORTABLE ABDOMEN - 1 VIEW  COMPARISON:  Portable exam  1642 hr compared to 07/28/2014  FINDINGS: Feeding tube coiled at mid stomach with tip reflected to fundus.  Nonobstructive bowel gas pattern.  Minimal atelectasis and questionable fusions at lung bases.  Loop recorder projects over chest.  IMPRESSION: Feeding tube coiled at mid to proximal stomach.   Electronically Signed   By: Ulyses SouthwardMark  Boles M.D.   On: 07/30/2014 16:55   Dg Abd Portable 1v  07/28/2014   CLINICAL DATA:  Endotracheal tube and orogastric tube placement  EXAM: PORTABLE ABDOMEN - 1 VIEW  COMPARISON:  None.  FINDINGS: Orogastric tube crosses the gastroesophageal junction with tip in the left upper quadrant in the anticipated position of stomach. Endotracheal tube is also seen on this study with tip about 12 mm above the carinal.  IMPRESSION: Endotracheal tube and OG tube as described.   Electronically Signed   By: Esperanza Heiraymond  Rubner M.D.   On: 07/28/2014 02:24   Dg Swallowing Func-speech Pathology  08/01/2014    Riley NearingBonnie Caroline Deblois, CCC-SLP     08/01/2014  1:43 PM Objective Swallowing Evaluation: Modified Barium Swallowing Study   Patient Details  Name: Erin FullingBetty J Hoke MRN: [id number] Date of Birth: 1941/09/24  Today's Date: 08/01/2014 Time: 1300-1330 SLP Time Calculation (min): 30 min  Past Medical History:  Past Medical History  Diagnosis Date  . Hypertension   . Diabetes mellitus   . Chronic renal insufficiency   . Hyperlipidemia   . Osteopenia   . CVA (cerebral infarction)   . Thyroid disease    Past Surgical History:  Past Surgical History  Procedure Laterality Date  . Eye surgery    . Loop recorder implant  03-24-2014    MDT LinQ implanted by Dr Johney FrameAllred for cryptogenic stroke  . Tee without cardioversion N/A 03/23/2014    Procedure: TRANSESOPHAGEAL ECHOCARDIOGRAM (TEE);  Surgeon:  Lars MassonKatarina H Nelson, MD;  [facility];  Service:  Cardiovascular;  Laterality: N/A;   HPI:  [name] with COPD, CVA, CK D., Diabetes admitted 9/18  with acute respiratory distress , hypercarbic respiratory  failure, DKA with glucose of 802 and anion gap of 19 , requiring  mechanical ventilation. Chest x-ray showed left upper lobe  infiltrate. Was placed on BiPAP initially, but unresponsive on  arrival to the ICU, hence intubated. She had recent admit from  8/6 to 814 for hypercarbic failure that improved with BiPAP-dc'd  to NH , then home since 9/14. 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 Patient has been seen by SLP  during previous admissions and has been observed with a delayed  oral phase and suspected delayed swallow initiation with s/s of  aspiration with thin liquids via straw. Most frequent diet  recommendations for dysphagia 1 solids, thin liquids.      Assessment / Plan / Recommendation Clinical Impression  Dysphagia Diagnosis: Moderate oral phase dysphagia;Mild  pharyngeal phase dysphagia Clinical impression: Pt presents with a primary cognitive based,  oral dyspahgia with lingual pumping and  piecemeal transit of PO,  leading to multiple swallows per bolus. Swallow response is only  slightly delayed but mild base of tongue weakness results in  trace vallecular residuals and very trace penetration after the  swallow with nectar thick liquids. Pt senses penetrate and expels  with a soft throat clear. Given overt aspiration of thin at  bedside and pts AMS, thin liquids not tested though expect pt  will be able to upgrade as mentation improves. Pt to initiate dys  1 (puree) diet and  nectar thick liquids. SLP will follow for  tolerance.      Treatment Recommendation  Therapy as outlined in treatment plan below    Diet Recommendation Dysphagia 1 (Puree);Nectar-thick liquid   Liquid Administration via: Cup;Straw Medication Administration: Whole meds with puree Supervision: Staff to assist with self feeding Compensations: Slow rate;Small sips/bites Postural Changes and/or Swallow Maneuvers: Seated upright 90  degrees;Upright 30-60 min after meal    Other  Recommendations Oral Care Recommendations: Oral care BID Other Recommendations: Order thickener from pharmacy   Follow Up Recommendations  Skilled Nursing facility    Frequency and Duration min 2x/week  2 weeks   Pertinent Vitals/Pain NA    SLP Swallow Goals     General HPI: [name] with COPD, CVA, CK D., Diabetes  admitted 9/18 with acute respiratory distress , hypercarbic  respiratory failure, DKA with glucose of 802 and anion gap of 19  , requiring mechanical ventilation. Chest x-ray showed left upper  lobe infiltrate. Was placed on BiPAP initially, but unresponsive  on arrival to the ICU, hence intubated. She had recent admit from  8/6 to 814 for hypercarbic failure that improved with BiPAP-dc'd  to NH , then home since 9/14. 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 Patient has been seen by SLP  during previous admissions and has been observed with a delayed  oral phase and suspected delayed swallow initiation with s/s of   aspiration with thin liquids via straw. Most frequent diet  recommendations for dysphagia 1 solids, thin liquids.  Type of Study: Modified Barium Swallowing Study Reason for Referral: Objectively evaluate swallowing function Previous Swallow Assessment: see HPI Diet Prior to this Study: NPO Temperature Spikes Noted: No Respiratory Status: Room air History of Recent Intubation: Yes Length of Intubations (days): 2 days Date extubated: 07/29/14 Behavior/Cognition: Alert;Cooperative;Requires cueing Oral Cavity - Dentition: Edentulous Oral Motor / Sensory Function: Within functional limits Self-Feeding Abilities: Total assist Patient Positioning: Upright in chair Baseline Vocal Quality: Clear Volitional Cough: Weak Volitional Swallow: Able to elicit Pharyngeal Secretions: Not observed secondary MBS    Reason for Referral Objectively evaluate swallowing function   Oral Phase Oral Preparation/Oral Phase Oral Phase: Impaired Oral - Nectar Oral - Nectar Cup: Lingual pumping;Reduced posterior  propulsion;Piecemeal swallowing;Delayed oral transit Oral - Nectar Straw: Lingual pumping;Reduced posterior  propulsion;Piecemeal swallowing;Delayed oral transit Oral - Solids Oral - Puree: Lingual pumping;Reduced posterior  propulsion;Piecemeal swallowing;Delayed oral transit   Pharyngeal Phase Pharyngeal Phase Pharyngeal Phase: Impaired Pharyngeal - Nectar Pharyngeal - Nectar Cup: Reduced tongue base  retraction;Penetration/Aspiration after swallow;Pharyngeal  residue - valleculae Penetration/Aspiration details (nectar cup): Material enters  airway, CONTACTS cords then ejected out;Material does not enter  airway Pharyngeal - Nectar Straw: Reduced tongue base  retraction;Penetration/Aspiration after swallow;Pharyngeal  residue - valleculae Penetration/Aspiration details (nectar straw): Material does not  enter airway;Material enters airway, CONTACTS cords then ejected  out Pharyngeal - Solids Pharyngeal - Puree: Reduced tongue base  retraction;Pharyngeal  residue - valleculae Penetration/Aspiration details (puree): Material does not enter  airway  Cervical Esophageal Phase    GO    Cervical Esophageal Phase Cervical Esophageal Phase: Impaired Cervical Esophageal Phase - Comment Cervical Esophageal Comment: Appearance of slow transit, stasis  through GE junction        [employer], MA CCC-SLP [phone]  DeBlois, Riley Nearing 08/01/2014, 1:42 PM     Medications: Scheduled Meds: . antiseptic oral rinse  7 mL Mouth Rinse q12n4p  . atorvastatin  40 mg Oral Daily  .  budesonide-formoterol  2 puff Inhalation BID  . chlorhexidine  15 mL Mouth Rinse BID  . dipyridamole-aspirin  1 capsule Oral BID  . heparin  5,000 Units Subcutaneous 3 times per day  . insulin aspart  0-9 Units Subcutaneous TID WC  . levothyroxine  25 mcg Intravenous Daily  . lidocaine      . vancomycin  1,000 mg Intravenous On Call      LOS: 7 days   RAI,RIPUDEEP M.D. [facility] 08/13/2014, 12:53 PM Pager: [phone]  If 7PM-7AM, please contact night-coverage [email or web address] Password [email or web address]

## 2014-08-13 NOTE — Progress Notes (Signed)
Dr Grace IsaacWatts obtained telephone consent from son to place piccline.  2L picc placed to rt arm. Pt tolerated well. SBP remains above 200.  Will give prn apresoline.

## 2014-08-13 NOTE — Progress Notes (Signed)
PT Cancellation Note  Patient Details Name: [name] [name] MRN: [id number] DOB: Jul 29, 1941   Cancelled Treatment:    Reason Eval/Treat Not Completed: Patient at procedure or test/unavailable. Pt off floor for PEG placement. Will re-attempt at next available time.    Donnamarie PoagWest, Jerrel Tiberio CreightonN, [place]  [phone] 08/13/2014, 11:12 AM

## 2014-08-13 NOTE — Progress Notes (Addendum)
Dr. Grace IsaacWatts in to evaluate pt for GT post enema.  Updated on BP, apresoline. No new orders given.

## 2014-08-13 NOTE — Progress Notes (Signed)
Pt transported back to [facility] on [facility] w/ transporter.

## 2014-08-13 NOTE — Progress Notes (Signed)
OT Cancellation Note  Patient Details Name: [name] FullingBetty J [name] MRN: [id number] DOB: 02/27/1941   Cancelled Treatment:    Reason Eval/Treat Not Completed: Patient not medically ready;Patient at procedure or test/ unavailable (Peg placement)  Harolyn RutherfordJones, Jaliel Deavers B Pager: [phone]  08/13/2014, 9:39 AM

## 2014-08-13 NOTE — Progress Notes (Signed)
SLP Cancellation Note  Patient Details Name: Erin FullingBetty J Meyn MRN: [id number] DOB: [birth year]   Cancelled treatment:       Reason Eval/Treat Not Completed: Patient at procedure or test/unavailable   Rondo Spittler, Riley NearingBonnie Caroline 08/13/2014, 12:32 PM

## 2014-08-14 LAB — BASIC METABOLIC PANEL
ANION GAP: 14 (ref 5–15)
BUN: 12 mg/dL (ref 6–23)
CO2: 24 meq/L (ref 19–32)
Calcium: 8.4 mg/dL (ref 8.4–10.5)
Chloride: 113 mEq/L — ABNORMAL HIGH (ref 96–112)
Creatinine, Ser: 1.19 mg/dL — ABNORMAL HIGH (ref 0.50–1.10)
GFR calc Af Amer: 50 mL/min — ABNORMAL LOW (ref >90)
GFR calc non Af Amer: 44 mL/min — ABNORMAL LOW (ref >90)
GLUCOSE: 185 mg/dL — AB (ref 70–99)
Potassium: 4.3 mEq/L (ref 3.7–5.3)
SODIUM: 151 meq/L — AB (ref 137–147)

## 2014-08-14 LAB — GLUCOSE, CAPILLARY
GLUCOSE-CAPILLARY: 160 mg/dL — AB (ref 70–99)
GLUCOSE-CAPILLARY: 177 mg/dL — AB (ref 70–99)
GLUCOSE-CAPILLARY: 185 mg/dL — AB (ref 70–99)
Glucose-Capillary: 169 mg/dL — ABNORMAL HIGH (ref 70–99)
Glucose-Capillary: 165 mg/dL — ABNORMAL HIGH (ref 70–99)
Glucose-Capillary: 244 mg/dL — ABNORMAL HIGH (ref 70–99)

## 2014-08-14 MED ORDER — LABETALOL HCL 5 MG/ML IV SOLN
10.0000 mg | Freq: Once | INTRAVENOUS | Status: AC
  Administered 2014-08-14: 10 mg via INTRAVENOUS
  Filled 2014-08-14: qty 4

## 2014-08-14 MED ORDER — INSULIN GLARGINE 100 UNIT/ML ~~LOC~~ SOLN
5.0000 [IU] | Freq: Every day | SUBCUTANEOUS | Status: DC
  Administered 2014-08-14: 5 [IU] via SUBCUTANEOUS
  Filled 2014-08-14 (×2): qty 0.05

## 2014-08-14 MED ORDER — HYDRALAZINE HCL 20 MG/ML IJ SOLN
10.0000 mg | INTRAMUSCULAR | Status: DC | PRN
  Administered 2014-08-15: 10 mg via INTRAVENOUS
  Filled 2014-08-14 (×2): qty 1

## 2014-08-14 MED ORDER — DEXTROSE 5 % IV SOLN
INTRAVENOUS | Status: DC
  Administered 2014-08-14 (×2): via INTRAVENOUS
  Administered 2014-08-15: 1000 mL via INTRAVENOUS

## 2014-08-14 MED ORDER — HYDRALAZINE HCL 20 MG/ML IJ SOLN
10.0000 mg | Freq: Three times a day (TID) | INTRAMUSCULAR | Status: DC
  Administered 2014-08-14 – 2014-08-15 (×2): 10 mg via INTRAVENOUS
  Filled 2014-08-14: qty 1

## 2014-08-14 NOTE — Progress Notes (Signed)
[facility] was asked to see patient for consideration of PEG tube placement.  Patient with history of stroke and failed swallowing studies, with need of PEG tube prior to long-term placement.  Patient's case was reviewed in detail, including images from interventional radiology.  Our approach for PEG tube is similar to IR, in that we are directly placing a tube to enmesh the stomach with the ventral wall of the stomach.  We would encounter the same trouble as IR, notably that there would be a not insignificant risk of inadvertently puncturing the colon while trying to perform a PEG tube procedure.  I do not feel comfortable performing PEG tube placement.  I have also discussed with my partner, Dr. Matthias HughsBuccini (who is the hospital doctor next week and would be the one to do the procedure), who likewise also does not feel comfortable performing the procedure.  Accordingly, I feel that gastrostomy tube placement by surgery, facilitated by direct visualization, would be necessary for safe gastrostomy tube placement.  Will sign-off; thank you for the consult; please call with any questions.

## 2014-08-14 NOTE — Progress Notes (Signed)
Physical Therapy Treatment Patient Details Name: [name] FullingBetty J [name] MRN: [id number] DOB: [birth year] Today's Date: 08/14/2014    History of Present Illness 73 yo [facility] due to AMS and expressive aphasia. BP on arrival 212/75. 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 PMHx includes recent admission (07/2014) with respiratory failure, DM, and CVA.     PT Comments    Pt is progressing well with her mobility.  I found today that with repeated standing attempts she did better in consecutive attempts than on the first attempt. Pt continues to be a two person assist to get to the chair from the bed and functionally left seems weaker than right, so may have more success going to the right next time during stand pivot.  Pt continues to benefit from acute PT.  SNF level rehab appropriate at discharge.   Follow Up Recommendations  SNF     Equipment Recommendations  None recommended by PT    Recommendations for Other Services   NA     Precautions / Restrictions Precautions Precautions: Fall Precaution Comments: left sided weakness    Mobility  Bed Mobility Overal bed mobility: Needs Assistance;+2 for physical assistance Bed Mobility: Supine to Sit Rolling: +2 for physical assistance;Max assist         General bed mobility comments: Two person max assist to support trunk and progess legs to EOB.  Pt needed help initiating movement, but once movement direction established, pt began to try to help mostly with right upper extremity.   Transfers Overall transfer level: Needs assistance Equipment used: 2 person hand held assist Transfers: Sit to/from [employer]Stand;Stand Pivot Transfers Sit to Stand: +2 physical assistance;Mod assist Stand pivot transfers: +2 physical assistance;Mod assist       General transfer comment: Two person mod assist to stand from elevated bed to recliner chair on her left side (would have likely gone a bit better if we went to the right).  Pt  fearful and verbalizing fear of falling throughout transfer.  Pt stood again from recliner chair with two person mod assit and did better on the second stand indicating to me that repetition with her will decrease the level of assist needed and increase her overall stregth of task.   Ambulation/Gait             General Gait Details: unable at this time.        Modified Rankin (Stroke Patients Only) Modified Rankin (Stroke Patients Only) Pre-Morbid Rankin Score: Moderately severe disability Modified Rankin: Severe disability     Balance Overall balance assessment: Needs assistance Sitting-balance support: Feet supported;Bilateral upper extremity supported Sitting balance-Leahy Scale: Fair     Standing balance support: Single extremity supported;Bilateral upper extremity supported Standing balance-Leahy Scale: Poor Standing balance comment: pt needs external support to get to standing.  Bed pad used to help facilitate hip extension and left knee blocked by therapist.                     Cognition Arousal/Alertness: Awake/alert Behavior During Therapy: WFL for tasks assessed/performed Overall Cognitive Status: History of cognitive impairments - at baseline                 General Comments: Pt able to recall DOB and husbands name. Pt unable to recall patients spouse birthday. pt very echolic at times.        General Comments General comments (skin integrity, edema, etc.): O2 left out of nose as O2 sats  were 96% on RA after mobility.  HR in the 70s      Pertinent Vitals/Pain Pain Assessment: No/denies pain           PT Goals (current goals can now be found in the care plan section) Acute Rehab PT Goals Patient Stated Goal: none stated Progress towards PT goals: Progressing toward goals    Frequency  Min 2X/week (as she is from SNF and had significant pre morbid impairment)    PT Plan Current plan remains appropriate       End of Session  Equipment Utilized During Treatment: Gait belt Activity Tolerance: Patient limited by fatigue Patient left: in chair;with call bell/phone within reach;with family/visitor present (husband, [name] [facility]     Time: [id number] PT Time Calculation (min): 29 min  Charges:  $Therapeutic Activity: 23-37 mins                      [name], PT, DPT [phone]   08/14/2014, 5:44 PM

## 2014-08-14 NOTE — Progress Notes (Addendum)
CSW continues to follow pt to provide continued support and facilitate discharge needs when medically stable. [facility]. Palliative care involved. DNR form and FL-2 placed on chart for MD signature. MD notified.   Derenda FennelBashira Tenley Winward, MSW, LCSWA [phone] 08/14/2014 2:04 PM

## 2014-08-14 NOTE — Progress Notes (Signed)
Occupational Therapy Treatment Patient Details Name: [name] FullingBetty J [name] MRN: [id number] DOB: [birth year] Today's Date: 08/14/2014    History of present illness 73 yo [facility] due to AMS expressive aphasia. BP on arrival 212/75. 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 DM, CVA ( L parietal, L Basal ganglia, L cerebellar, l temporal , R caudate   OT comments  Pt completed oral care this session and limited by BP 199/63. Ot to continue to follow and spouse educated the reason session terminated. Pt reports no pain when asked. Pt remains awaiting PEG placement at this time. Ot to follow acutely for adl retraining with static balance.     Follow Up Recommendations  SNF;Supervision/Assistance - 24 hour    Equipment Recommendations  Wheelchair (measurements OT);Wheelchair cushion (measurements OT);Other (comment);Hospital bed    Recommendations for Other Services      Precautions / Restrictions Precautions Precautions: Fall Precaution Comments: watch BP       Mobility Bed Mobility               General bed mobility comments: in chair   Transfers                      Balance                                   ADL Overall ADL's : Needs assistance/impaired     Grooming: Oral care;Maximal assistance;Sitting Grooming Details (indicate cue type and reason): pt initiated and completing. pt noted to have large muscus on top of mouth. OT helping (A) patient with moisitening mouth to attempt to remove. RN notified pt will need additional attempts                               General ADL Comments: Pt in chair on arrival. pt unable to complete complete sit<>Stand due to BP 199/63. RN called and reports not to attempt transfer with patient at this time. Session focused on cognition and oral care      Vision                     Perception     Praxis       Cognition     Overall Cognitive Status: History of cognitive impairments - at baseline                  General Comments: Pt able to recall DOB and husbands name. Pt unable to recall patients spouse birthday. pt very echolic at times.     Extremity/Trunk Assessment               Exercises     Shoulder Instructions       General Comments      Pertinent Vitals/ Pain       Pain Assessment: No/denies pain  Home Living                                          Prior Functioning/Environment              Frequency Min 1X/week     Progress Toward Goals  OT Goals(current goals can now be  found in the care plan section)  Progress towards OT goals: Not progressing toward goals - comment (limited by BP)  Acute Rehab OT Goals Patient Stated Goal: did not state OT Goal Formulation: Patient unable to participate in goal setting Time For Goal Achievement: 08/24/14 Potential to Achieve Goals: Fair ADL Goals Pt Will Perform Grooming: with min assist;sitting Additional ADL Goal #1: Pt will static sit EOB with min (A) for 5 minutes Additional ADL Goal #2: Pt will complete bed mobility mod (A) with bed rails and hob elevated  Plan Discharge plan remains appropriate    Co-evaluation                 End of Session     Activity Tolerance Patient tolerated treatment well   Patient Left in chair;with call bell/phone within reach;with family/visitor present;with chair alarm set   Nurse Communication Mobility status;Precautions        Time: [id number] OT Time Calculation (min): 17 min  Charges: OT General Charges $OT Visit: 1 Procedure OT Treatments $Self Care/Home Management : 8-22 mins  Boone MasterJones, Lucero Ide B 08/14/2014, 3:46 PM Pager: [phone]

## 2014-08-14 NOTE — Progress Notes (Signed)
Patient ID: [name] [name]  female  ONG:[id number]    DOB: September 09, 1941    DOA: 08/06/2014  PCP: Alva Garnet., MD  Brief narrative:  Patient is a 73 year old with prior CVAs, hypertension, DM, hypothyroidism who presented to the hospital secondary to inability to speak. Upon further evaluation was found to have a new stroke. The patient has failed bedside swallow evaluation and after discussion with POA, palliative meeting, requested PEG tube placement as part of their goals of care.   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 - Patient was on Plavix 75 mg daily prior to admission, switched to Aggrenox by neurology - Failed swallow evaluation, awaiting PEG tube placement-> IR unable to place, d/w GI, Dr Dulce Sellar - PT recommended skilled nursing facility  Active Problems:  Hypernatremia - Placed on D5 drip    DM type 2, uncontrolled, with renal complications - Continue CBGs every 4 hours, continue sliding scale insulin , added basal insulin 5 units daily Lantus    CKD (chronic kidney disease), stage III - Currently stable    Hypothyroidism - Change to IV Synthroid    COPD (chronic obstructive pulmonary disease) - Currently stable   Dysphagia, pharyngoesophageal phase - Awaiting PEG tube placement, IR was unable to place PEG tube today, discussed with GI   DVT Prophylaxis:  Code Status:  Family Communication: Discussed with patient's husband at the bedside  Disposition: PEG tube placement pending, after PEG tube is functional, will transition to SNF  Consultants:  Neurology  Palliative care   Antibiotics:  None    Subjective: Aphasia, nonverbal,  does not cooperate with exam   Objective: Weight change:   Intake/Output Summary (Last 24 hours) at 08/14/14 1115 Last data filed at 08/13/14 1339  Gross per 24 hour  Intake      0 ml  Output      0 ml  Net      0 ml   Blood pressure 176/53, pulse 82, temperature 98.8 F (37.1 C), temperature source Oral, resp. rate 20, height 5\' 3"  (1.6 m), weight 76 kg (167 lb 8.8 oz), SpO2 99.00%.  Physical Exam: General: Alert and awake awake, NAD  CVS: S1-S2 clear, no murmur rubs or gallops Chest: CTAB Abdomen: soft nontender, nondistended, normal bowel sounds  Extremities: no cyanosis, clubbing or edema noted bilaterally Neuro: Does not cooperate with exam  Lab Results: Basic Metabolic Panel:  Recent Labs Lab 08/13/14 0830 08/14/14 0500  NA 149* 151*  K 4.5 4.3  CL 114* 113*  CO2 23 24  GLUCOSE 181* 185*  BUN 12 12  CREATININE 1.17* 1.19*  CALCIUM 8.6 8.4   Liver Function Tests: No results found for this basename: AST, ALT, ALKPHOS, BILITOT, PROT, ALBUMIN,  in the last 168 hours No results found for this basename: LIPASE, AMYLASE,  in the last 168 hours No results found for this basename: AMMONIA,  in the last 168 hours CBC:  Recent Labs Lab 08/13/14 0830  WBC 4.6  HGB 9.8*  HCT 31.3*  MCV 92.1  PLT 265   Cardiac Enzymes: No results found for this basename: CKTOTAL, CKMB, CKMBINDEX, TROPONINI,  in the last 168 hours BNP: No components found with this basename:  POCBNP,  CBG:  Recent Labs Lab 08/13/14 1616 08/13/14 1950 08/13/14 2351 08/14/14 0409 08/14/14 0750  GLUCAP 189* 184* 160* 169* 185*     Micro Results: No results found for this or any previous visit (from the past 240 hour(s)).  Studies/Results: Ct Head Wo Contrast  08/06/2014   CLINICAL DATA:  Code stroke.  Facial droop.  EXAM: CT HEAD WITHOUT CONTRAST  TECHNIQUE: Contiguous axial images were obtained from the base of the skull through the vertex without intravenous contrast.  COMPARISON:   08/04/2014  FINDINGS: Diffuse cerebral atrophy. Ventricular dilatation consistent with central atrophy. Low-attenuation changes throughout the deep white matter consistent with small vessel ischemia. Old lacunar infarcts in the right caudate lobe, left thalamus, and left cerebellum. No change since previous study. No mass effect or midline shift. No abnormal extra-axial fluid collections. Gray-white matter junctions are distinct. Basal cisterns are not effaced. No evidence of acute intracranial hemorrhage. No depressed skull fractures. Small retention cyst in the sphenoid sinus. Mastoid air cells are not opacified.  IMPRESSION: No acute intracranial abnormalities. Chronic atrophy and small vessel ischemic changes with multiple old lacunar infarcts.  These results were called by telephone at the time of interpretation on 08/06/2014 at 10:52 pm to Dr. Vanetta MuldersSCOTT ZACKOWSKI , who verbally acknowledged these results.   Electronically Signed   By: Burman NievesWilliam  Stevens M.D.   On: 08/06/2014 22:54   Ct Head Wo Contrast  08/04/2014   CLINICAL DATA:  Facial droop and left upper extremity paresis.  EXAM: CT HEAD WITHOUT CONTRAST  TECHNIQUE: Contiguous axial images were obtained from the base of the skull through the vertex without intravenous contrast.  COMPARISON:  CT scan of July 24, 2014.  FINDINGS: Bony calvarium appears intact. Mild diffuse cortical atrophy is noted. Moderate chronic ischemic white matter disease is noted. Stable calcifications are noted in the basal ganglia bilaterally. No mass effect or midline shift is noted. Ventricular size is within normal limits. There is no evidence of mass lesion, hemorrhage or acute infarction.  IMPRESSION: Mild diffuse cortical atrophy. Moderate chronic ischemic white matter disease. No acute intracranial abnormality seen.   Electronically Signed   By: Roque LiasJames  Green M.D.   On: 08/04/2014 14:02   Ct Head Wo Contrast  07/24/2014   CLINICAL DATA:  Elevated blood glucose,  hypertensive, mental status change  EXAM: CT HEAD WITHOUT CONTRAST  TECHNIQUE: Contiguous axial images were obtained from the base of the skull through the vertex without intravenous contrast.  COMPARISON:  06/12/2014  FINDINGS: There is no evidence of mass effect, midline shift, or extra-axial fluid collections. There is no evidence of a space-occupying lesion or intracranial hemorrhage. There is no evidence of a cortical-based area of acute infarction. There is an old right basal ganglia and bilateral thalamic lacunar infarct. There is generalized cerebral atrophy. There is periventricular white matter low attenuation likely secondary to microangiopathy.  The ventricles and sulci are appropriate for the patient's age. The basal cisterns are patent.  Visualized portions of the orbits are unremarkable. The visualized portions of the paranasal sinuses and mastoid air cells are unremarkable. Cerebrovascular atherosclerotic calcifications are noted.  The osseous structures are unremarkable.  IMPRESSION: No acute intracranial pathology.   Electronically Signed   By: Elige KoHetal  Patel   On: 07/24/2014 11:06   Mr Maxine GlennMra Head Wo Contrast  08/08/2014   CLINICAL DATA:  Sudden onset of inability to speak beginning yesterday. Symptoms have now resolved. Stroke risk factors include previous stroke, diabetes, hypertension, hyperlipidemia, and chronic  renal disease.  EXAM: MRI HEAD WITHOUT CONTRAST  MRA HEAD WITHOUT CONTRAST  TECHNIQUE: Multiplanar, multiecho pulse sequences of the brain and surrounding structures were obtained without intravenous contrast. Angiographic images of the head were obtained using MRA technique without contrast.  COMPARISON:  CT head 08/06/2014.  MR head 04/24/14  FINDINGS: MRI HEAD FINDINGS  Subcentimeter acute infarct in the LEFT corpus callosum just lateral and superior to the genu. This lies within the LEFT ACA territory. No other areas of acute infarction are identified. No hemorrhage, mass lesion, or  extra-axial fluid.  Hydrocephalus ex vacuo. Extensive small vessel disease throughout the periventricular and subcortical white matter. Widespread remote basal ganglia, thalamic, and deep white matter infarcts some of which were acute on the previous study from June. Partial empty sella. Cervical spondylosis. No osseous lesions. BILATERAL cataract extraction. Chronic sinus and mastoid fluid.  MRA HEAD FINDINGS  Misregistration of the process images precludes accurate stenosis evaluation. There is gross patency of the internal carotid arteries and basilar artery with RIGHT vertebral dominant. Suspected moderate RIGHT cavernous ICA disease and mild LEFT cavernous ICA disease suspected mild disease of the distal LEFT vertebral. There is no proximal flow reducing lesion of the anterior, middle, or posterior cerebral arteries. Both distal anterior cerebral arteries appear patent.  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  Advanced atrophy, small vessel disease, and widespread remote deep nuclei and deep white matter infarcts.  Patient motion results in misregistration of the MRA exam. Gross patency of the intracranial vasculature, including both anterior cerebral arteries, is established however.   Electronically Signed   By: Davonna Belling M.D.   On: 08/08/2014 09:54   Mr Brain Wo Contrast  08/08/2014   CLINICAL DATA:  Sudden onset of inability to speak beginning yesterday. Symptoms have now resolved. Stroke risk factors include previous stroke, diabetes, hypertension, hyperlipidemia, and chronic renal disease.  EXAM: MRI HEAD WITHOUT CONTRAST  MRA HEAD WITHOUT CONTRAST  TECHNIQUE: Multiplanar, multiecho pulse sequences of the brain and surrounding structures were obtained without intravenous contrast. Angiographic images of the head were obtained using MRA technique without contrast.  COMPARISON:  CT head 08/06/2014.  MR head 04/24/14  FINDINGS: MRI HEAD FINDINGS   Subcentimeter acute infarct in the LEFT corpus callosum just lateral and superior to the genu. This lies within the LEFT ACA territory. No other areas of acute infarction are identified. No hemorrhage, mass lesion, or extra-axial fluid.  Hydrocephalus ex vacuo. Extensive small vessel disease throughout the periventricular and subcortical white matter. Widespread remote basal ganglia, thalamic, and deep white matter infarcts some of which were acute on the previous study from June. Partial empty sella. Cervical spondylosis. No osseous lesions. BILATERAL cataract extraction. Chronic sinus and mastoid fluid.  MRA HEAD FINDINGS  Misregistration of the process images precludes accurate stenosis evaluation. There is gross patency of the internal carotid arteries and basilar artery with RIGHT vertebral dominant. Suspected moderate RIGHT cavernous ICA disease and mild LEFT cavernous ICA disease suspected mild disease of the distal LEFT vertebral. There is no proximal flow reducing lesion of the anterior, middle, or posterior cerebral arteries. Both distal anterior cerebral arteries appear patent.  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  Advanced atrophy, small vessel disease, and widespread remote deep nuclei and deep white matter infarcts.  Patient motion results in misregistration of the MRA exam. Gross patency of the intracranial vasculature, including both anterior cerebral arteries, is established however.  Electronically Signed   By: Davonna Belling M.D.   On: 08/08/2014 09:54   Dg Chest Port 1 View  08/07/2014   CLINICAL DATA:  Short of breath. Labored in shallow breathing. Unresponsive.  EXAM: PORTABLE CHEST - 1 VIEW  COMPARISON:  07/29/2014  FINDINGS: Probable linear atelectasis in the left mid lung. Normal heart size and pulmonary vascularity. No focal airspace disease in the lungs. No blunting of costophrenic angles. No pneumothorax. Mediastinal contours appear  intact.  IMPRESSION: No active disease.   Electronically Signed   By: Burman Nieves M.D.   On: 08/07/2014 00:02   Dg Chest Port 1 View  07/29/2014   CLINICAL DATA:  Intubation.  EXAM: PORTABLE CHEST - 1 VIEW  COMPARISON:  07/28/2014.  FINDINGS: Endotracheal tube 1.9 cm above the carina. Left IJ line in NG tube in stable position. Cardiac monitor stable position. Heart size is stable. Pulmonary vascularity is normal. Bibasilar atelectasis and/or infiltrates are present. Small bilateral pleural effusions cannot be excluded. A mild component congestive heart failure cannot be excluded. No pneumothorax. No acute osseus abnormality.  IMPRESSION: 1. Interval reposition of endotracheal tube. Its tip is now 1.9 cm above the carina left IJ line, and NG tube in stable position. 2. Interval development of bibasilar infiltrates and possible tiny pleural effusions. Findings suggest mild CHF. Bibasilar pneumonia cannot be excluded.   Electronically Signed   By: Maisie Fus  Register   On: 07/29/2014 07:45   Dg Chest Portable 1 View  07/28/2014   CLINICAL DATA:  Endotracheal tube and orogastric tube placement  EXAM: PORTABLE CHEST - 1 VIEW  COMPARISON:  07/27/2014  FINDINGS: Endotracheal tube is seen with tip directly at the carina directed toward left main bronchus. Left internal jugular central line is seen with tip over the junction of the brachiocephalic vein and superior vena cava. There is no pneumothorax. NG tube crosses the gastroesophageal junction.  Right lung is clear. Partial consolidation left lower lobe most consistent with partial atelectasis, improved when compared to the prior study. When compared to prior study there is improved aeration in the left lower lobe.  IMPRESSION: Endotracheal tube at the carina. Consider retracting the tube 2-3 cm and repeating the image. Continued atelectasis left lower lobe, but improved when compared to prior study. These results were called by telephone at the time of  interpretation on 07/28/2014 at 2:23 am to Joy, the patient's charge nurse, who verbally acknowledged these results.   Electronically Signed   By: Esperanza Heir M.D.   On: 07/28/2014 02:23   Dg Chest Port 1 View  07/27/2014   CLINICAL DATA:  Pneumonitis.  EXAM: PORTABLE CHEST - 1 VIEW  COMPARISON:  07/26/2014.  FINDINGS: Endotracheal tube ends between the clavicular heads and carina. Left IJ catheter is likely stable in positioning, although there is new distortion from leftward rotation. Orogastric tube enters the stomach at least.  There is no change in heart size and mediastinal contours when accounting for rotation. Implantable cardiac monitor has a stable orientation.  Worsening basilar lung aeration. Hazy appearance favors atelectasis and possibly small pleural effusion. There is no edema or pneumothorax.  IMPRESSION: 1. Stable positioning of tubes and central line. 2. Significant improvement and left upper lobe aeration. Rapid clearing favors pneumonitis or atelectasis over pneumonia. 3. New bibasilar atelectasis.   Electronically Signed   By: Tiburcio Pea M.D.   On: 07/27/2014 06:25   Dg Chest Port 1 View  07/26/2014   CLINICAL DATA:  Ventilator dependent respiratory  failure. Followup left upper lobe pneumonia.  EXAM: PORTABLE CHEST - 1 VIEW  COMPARISON:  Portable chest x-rays yesterday, 07/24/2014, 06/13/2014.  FINDINGS: Endotracheal tube tip in satisfactory position projecting approximately 4-5 cm above the carina. Nasogastric tube courses below the diaphragm into the stomach. Left jugular sent venous catheter tip projects over the upper SVC, unchanged.  Cardiac silhouette upper normal in size to slightly enlarged but stable. Airspace consolidation in the left upper lobe, slightly worse than on yesterday's examination. No new pulmonary parenchymal abnormalities.  IMPRESSION: Support apparatus satisfactory. Slight worsening of left upper lobe pneumonia since yesterday. No new abnormalities.    Electronically Signed   By: Hulan Saas M.D.   On: 07/26/2014 09:10   Portable Chest Xray In Am  07/25/2014   CLINICAL DATA:  Follow-up endotracheal tube position.  EXAM: PORTABLE CHEST - 1 VIEW  COMPARISON:  07/24/2014  FINDINGS: Endotracheal tube remains in place with tip approximately 2-2.5 cm above the carina. Left jugular central venous catheter is unchanged with tip near the brachiocephalic vein confluence. Enteric tube courses into the left upper abdomen. Loop recorder is again seen. Cardiac silhouette is within normal limits for size. Left perihilar opacity has decreased from the prior study. Right lung remains clear. No definite pleural effusion or pneumothorax is identified.  IMPRESSION: 1. Endotracheal tube 2-2.5 cm above the carina. 2. Improved left upper lobe aeration.   Electronically Signed   By: Sebastian Ache   On: 07/25/2014 09:13   Portable Chest Xray  07/24/2014   CLINICAL DATA:  Interval intubation of the patient ; assess support tube and line. Placement  EXAM: PORTABLE CHEST - 1 VIEW  COMPARISON:  Portable chest x-ray of today's date.  FINDINGS: The endotracheal tube tip lies 2 cm above the crotch of the carina. The right internal jugular venous catheter tip lies at the level of the junction of the right and left brachiocephalic veins. The esophagogastric tube tip projects below the inferior margin of the image. A loop recorder is visible over the left atrial region. The cardiac silhouette is normal in size. The pulmonary vascularity is not engorged. There is confluent alveolar opacity in the left perihilar region that is more conspicuous than on the earlier study. The bony thorax exhibits no acute abnormality.  IMPRESSION: 1. The endotracheal tube tip lies 2.1 cm above the crotch of the carina. Withdrawal by 2-3 cm is recommended to avoid accidental mainstem bronchus intubation. 2. The left internal jugular venous catheter and the esophagogastric tube are in appropriate position  radiographically. There is no postprocedure pneumothorax or hemothorax. 3. There is increased alveolar opacity in the low left perihilar region consistent with pneumonia. 4. These results were called by telephone at the time of interpretation on 07/24/2014 at 7:21 pm to [facility], RN, who verbally acknowledged these results.   Electronically Signed   By: David  [place]   On: 07/24/2014 19:26   Dg Chest Port 1 View  07/24/2014   CLINICAL DATA:  Recent aspiration  EXAM: PORTABLE CHEST - 1 VIEW  COMPARISON:  06/13/2014  FINDINGS: The cardiac shadow is stable. Monitoring device is again seen. The right lung is clear. The left lung demonstrates patchy infiltrate in the upper lobe which may be related to the patient's recent aspiration.  IMPRESSION: Left upper lobe infiltrate. Followup films to resolution are recommended.   Electronically Signed   By: Alcide Clever M.D.   On: 07/24/2014 11:09   Dg Abd Portable 1v  07/30/2014   CLINICAL  DATA:  Feeding tube placement  EXAM: PORTABLE ABDOMEN - 1 VIEW  COMPARISON:  Portable exam 1642 hr compared to 07/28/2014  FINDINGS: Feeding tube coiled at mid stomach with tip reflected to fundus.  Nonobstructive bowel gas pattern.  Minimal atelectasis and questionable fusions at lung bases.  Loop recorder projects over chest.  IMPRESSION: Feeding tube coiled at mid to proximal stomach.   Electronically Signed   By: Ulyses SouthwardMark  Boles M.D.   On: 07/30/2014 16:55   Dg Abd Portable 1v  07/28/2014   CLINICAL DATA:  Endotracheal tube and orogastric tube placement  EXAM: PORTABLE ABDOMEN - 1 VIEW  COMPARISON:  None.  FINDINGS: Orogastric tube crosses the gastroesophageal junction with tip in the left upper quadrant in the anticipated position of stomach. Endotracheal tube is also seen on this study with tip about 12 mm above the carinal.  IMPRESSION: Endotracheal tube and OG tube as described.   Electronically Signed   By: Esperanza Heiraymond  Rubner M.D.   On: 07/28/2014 02:24   Dg Swallowing  Func-speech Pathology  08/01/2014   Riley NearingBonnie Caroline Deblois, CCC-SLP     08/01/2014  1:43 PM Objective Swallowing Evaluation: Modified Barium Swallowing Study   Patient Details  Name: Erin FullingBetty J Dragon MRN: [id number] Date of Birth: 03-Mar-1941  Today's Date: 08/01/2014 Time: 1300-1330 SLP Time Calculation (min): 30 min  Past Medical History:  Past Medical History  Diagnosis Date  . Hypertension   . Diabetes mellitus   . Chronic renal insufficiency   . Hyperlipidemia   . Osteopenia   . CVA (cerebral infarction)   . Thyroid disease    Past Surgical History:  Past Surgical History  Procedure Laterality Date  . Eye surgery    . Loop recorder implant  03-24-2014    MDT LinQ implanted by Dr Johney FrameAllred for cryptogenic stroke  . Tee without cardioversion N/A 03/23/2014    Procedure: TRANSESOPHAGEAL ECHOCARDIOGRAM (TEE);  Surgeon:  Lars MassonKatarina H Nelson, MD;  [facility];  Service:  Cardiovascular;  Laterality: N/A;   HPI:  [name] with COPD, CVA, CK D., Diabetes admitted 9/18  with acute respiratory distress , hypercarbic respiratory  failure, DKA with glucose of 802 and anion gap of 19 , requiring  mechanical ventilation. Chest x-ray showed left upper lobe  infiltrate. Was placed on BiPAP initially, but unresponsive on  arrival to the ICU, hence intubated. She had recent admit from  8/6 to 814 for hypercarbic failure that improved with BiPAP-dc'd  to NH , then home since 9/14. 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 Patient has been seen by SLP  during previous admissions and has been observed with a delayed  oral phase and suspected delayed swallow initiation with s/s of  aspiration with thin liquids via straw. Most frequent diet  recommendations for dysphagia 1 solids, thin liquids.      Assessment / Plan / Recommendation Clinical Impression  Dysphagia Diagnosis: Moderate oral phase dysphagia;Mild  pharyngeal phase dysphagia Clinical impression: Pt presents with a primary cognitive based,  oral  dyspahgia with lingual pumping and piecemeal transit of PO,  leading to multiple swallows per bolus. Swallow response is only  slightly delayed but mild base of tongue weakness results in  trace vallecular residuals and very trace penetration after the  swallow with nectar thick liquids. Pt senses penetrate and expels  with a soft throat clear. Given overt aspiration of thin at  bedside and pts AMS, thin liquids not tested though expect pt  will be able to upgrade as mentation improves. Pt to initiate dys  1 (puree) diet and nectar thick liquids. SLP will follow for  tolerance.      Treatment Recommendation  Therapy as outlined in treatment plan below    Diet Recommendation Dysphagia 1 (Puree);Nectar-thick liquid   Liquid Administration via: Cup;Straw Medication Administration: Whole meds with puree Supervision: Staff to assist with self feeding Compensations: Slow rate;Small sips/bites Postural Changes and/or Swallow Maneuvers: Seated upright 90  degrees;Upright 30-60 min after meal    Other  Recommendations Oral Care Recommendations: Oral care BID Other Recommendations: Order thickener from pharmacy   Follow Up Recommendations  Skilled Nursing facility    Frequency and Duration min 2x/week  2 weeks   Pertinent Vitals/Pain NA    SLP Swallow Goals     General HPI: [name] with COPD, CVA, CK D., Diabetes  admitted 9/18 with acute respiratory distress , hypercarbic  respiratory failure, DKA with glucose of 802 and anion gap of 19  , requiring mechanical ventilation. Chest x-ray showed left upper  lobe infiltrate. Was placed on BiPAP initially, but unresponsive  on arrival to the ICU, hence intubated. She had recent admit from  8/6 to 814 for hypercarbic failure that improved with BiPAP-dc'd  to NH , then home since 9/14. 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 Patient has been seen by SLP  during previous admissions and has been observed with a delayed  oral phase and suspected delayed  swallow initiation with s/s of  aspiration with thin liquids via straw. Most frequent diet  recommendations for dysphagia 1 solids, thin liquids.  Type of Study: Modified Barium Swallowing Study Reason for Referral: Objectively evaluate swallowing function Previous Swallow Assessment: see HPI Diet Prior to this Study: NPO Temperature Spikes Noted: No Respiratory Status: Room air History of Recent Intubation: Yes Length of Intubations (days): 2 days Date extubated: 07/29/14 Behavior/Cognition: Alert;Cooperative;Requires cueing Oral Cavity - Dentition: Edentulous Oral Motor / Sensory Function: Within functional limits Self-Feeding Abilities: Total assist Patient Positioning: Upright in chair Baseline Vocal Quality: Clear Volitional Cough: Weak Volitional Swallow: Able to elicit Pharyngeal Secretions: Not observed secondary MBS    Reason for Referral Objectively evaluate swallowing function   Oral Phase Oral Preparation/Oral Phase Oral Phase: Impaired Oral - Nectar Oral - Nectar Cup: Lingual pumping;Reduced posterior  propulsion;Piecemeal swallowing;Delayed oral transit Oral - Nectar Straw: Lingual pumping;Reduced posterior  propulsion;Piecemeal swallowing;Delayed oral transit Oral - Solids Oral - Puree: Lingual pumping;Reduced posterior  propulsion;Piecemeal swallowing;Delayed oral transit   Pharyngeal Phase Pharyngeal Phase Pharyngeal Phase: Impaired Pharyngeal - Nectar Pharyngeal - Nectar Cup: Reduced tongue base  retraction;Penetration/Aspiration after swallow;Pharyngeal  residue - valleculae Penetration/Aspiration details (nectar cup): Material enters  airway, CONTACTS cords then ejected out;Material does not enter  airway Pharyngeal - Nectar Straw: Reduced tongue base  retraction;Penetration/Aspiration after swallow;Pharyngeal  residue - valleculae Penetration/Aspiration details (nectar straw): Material does not  enter airway;Material enters airway, CONTACTS cords then ejected  out Pharyngeal - Solids Pharyngeal  - Puree: Reduced tongue base retraction;Pharyngeal  residue - valleculae Penetration/Aspiration details (puree): Material does not enter  airway  Cervical Esophageal Phase    GO    Cervical Esophageal Phase Cervical Esophageal Phase: Impaired Cervical Esophageal Phase - Comment Cervical Esophageal Comment: Appearance of slow transit, stasis  through GE junction        Harlon Ditty, MA CCC-SLP [phone]  [place]Blois, Riley Nearing 08/01/2014, 1:42 PM     Medications: Scheduled Meds: . antiseptic oral  rinse  7 mL Mouth Rinse q12n4p  . atorvastatin  40 mg Oral Daily  . budesonide-formoterol  2 puff Inhalation BID  . chlorhexidine  15 mL Mouth Rinse BID  . dipyridamole-aspirin  1 capsule Oral BID  . heparin  5,000 Units Subcutaneous 3 times per day  . insulin aspart  0-9 Units Subcutaneous TID WC  . insulin glargine  5 Units Subcutaneous Daily  . levothyroxine  25 mcg Intravenous Daily      LOS: 8 days   RAI,RIPUDEEP M.D. [facility] 08/14/2014, 11:15 AM Pager: [phone]  If 7PM-7AM, please contact night-coverage [email or web address] Password [email or web address]

## 2014-08-14 NOTE — Progress Notes (Signed)
Inpatient Diabetes Program Recommendations  AACE/ADA: New Consensus Statement on Inpatient Glycemic Control (2013)  Target Ranges:  Prepandial:   less than 140 mg/dL      Peak postprandial:   less than 180 mg/dL (1-2 hours)      Critically ill patients:  140 - 180 mg/dL   Reason for Assessment: elevated blood sugars  Diabetes history: Type 2 Outpatient Diabetes medications: Lantus 13 units q hs and Novolog 0-15 units ac meals Current orders for Inpatient glycemic control: Novolog correction 0-9 units 3x/day with meals  May want to consider ordering low dose basal insulin 5-10 units daily.   Susette RacerJulie Doloris Servantes, RN, BA, MHA, CDE Diabetes Coordinator Inpatient Diabetes Program  [phone] (Team Pager) [phone] Patrcia Dolly([place] Office) 08/14/2014 11:05 AM

## 2014-08-15 ENCOUNTER — Encounter (HOSPITAL_COMMUNITY): Payer: Self-pay | Admitting: General Surgery

## 2014-08-15 ENCOUNTER — Inpatient Hospital Stay (HOSPITAL_COMMUNITY): Payer: Medicare HMO

## 2014-08-15 LAB — GLUCOSE, CAPILLARY
GLUCOSE-CAPILLARY: 180 mg/dL — AB (ref 70–99)
GLUCOSE-CAPILLARY: 225 mg/dL — AB (ref 70–99)
GLUCOSE-CAPILLARY: 228 mg/dL — AB (ref 70–99)
Glucose-Capillary: 203 mg/dL — ABNORMAL HIGH (ref 70–99)
Glucose-Capillary: 195 mg/dL — ABNORMAL HIGH (ref 70–99)
Glucose-Capillary: 166 mg/dL — ABNORMAL HIGH (ref 70–99)

## 2014-08-15 LAB — BASIC METABOLIC PANEL
ANION GAP: 11 (ref 5–15)
BUN: 10 mg/dL (ref 6–23)
CHLORIDE: 110 meq/L (ref 96–112)
CO2: 26 mEq/L (ref 19–32)
Calcium: 8.5 mg/dL (ref 8.4–10.5)
Creatinine, Ser: 1.04 mg/dL (ref 0.50–1.10)
GFR calc non Af Amer: 51 mL/min — ABNORMAL LOW (ref >90)
GFR, EST AFRICAN AMERICAN: 59 mL/min — AB (ref >90)
Glucose, Bld: 229 mg/dL — ABNORMAL HIGH (ref 70–99)
Potassium: 3.8 mEq/L (ref 3.7–5.3)
Sodium: 147 mEq/L (ref 137–147)

## 2014-08-15 MED ORDER — PRO-STAT SUGAR FREE PO LIQD
30.0000 mL | Freq: Two times a day (BID) | ORAL | Status: DC
  Administered 2014-08-15 – 2014-08-17 (×5): 30 mL via ORAL
  Filled 2014-08-15 (×7): qty 30

## 2014-08-15 MED ORDER — INSULIN GLARGINE 100 UNIT/ML ~~LOC~~ SOLN: 8.0000 [IU] | Freq: Every day | SUBCUTANEOUS | Status: DC

## 2014-08-15 MED ORDER — INSULIN ASPART 100 UNIT/ML ~~LOC~~ SOLN
0.0000 [IU] | SUBCUTANEOUS | Status: DC
  Administered 2014-08-15 (×2): 2 [IU] via SUBCUTANEOUS
  Administered 2014-08-15: 3 [IU] via SUBCUTANEOUS
  Administered 2014-08-16: 2 [IU] via SUBCUTANEOUS
  Administered 2014-08-16: 3 [IU] via SUBCUTANEOUS
  Administered 2014-08-16 (×2): 5 [IU] via SUBCUTANEOUS
  Administered 2014-08-16: 3 [IU] via SUBCUTANEOUS
  Administered 2014-08-16: 5 [IU] via SUBCUTANEOUS
  Administered 2014-08-17: 2 [IU] via SUBCUTANEOUS
  Administered 2014-08-17: 5 [IU] via SUBCUTANEOUS
  Administered 2014-08-17: 2 [IU] via SUBCUTANEOUS
  Administered 2014-08-17: 1 [IU] via SUBCUTANEOUS
  Administered 2014-08-17 (×2): 3 [IU] via SUBCUTANEOUS
  Administered 2014-08-18: 1 [IU] via SUBCUTANEOUS

## 2014-08-15 MED ORDER — LABETALOL HCL 5 MG/ML IV SOLN
10.0000 mg | Freq: Once | INTRAVENOUS | Status: AC
  Administered 2014-08-15: 10 mg via INTRAVENOUS
  Filled 2014-08-15: qty 4

## 2014-08-15 MED ORDER — HYDRALAZINE HCL 20 MG/ML IJ SOLN
10.0000 mg | Freq: Once | INTRAMUSCULAR | Status: AC
  Administered 2014-08-15: 10 mg via INTRAVENOUS
  Filled 2014-08-15: qty 1

## 2014-08-15 MED ORDER — CLONIDINE HCL 0.3 MG/24HR TD PTWK
0.3000 mg | MEDICATED_PATCH | TRANSDERMAL | Status: DC
  Administered 2014-08-15: 0.3 mg via TRANSDERMAL
  Filled 2014-08-15: qty 1

## 2014-08-15 MED ORDER — ASPIRIN 300 MG RE SUPP
300.0000 mg | Freq: Every day | RECTAL | Status: DC
  Administered 2014-08-15: 300 mg via RECTAL
  Filled 2014-08-15: qty 1

## 2014-08-15 MED ORDER — INSULIN GLARGINE 100 UNIT/ML ~~LOC~~ SOLN
8.0000 [IU] | Freq: Every day | SUBCUTANEOUS | Status: DC
  Administered 2014-08-15 – 2014-08-17 (×3): 8 [IU] via SUBCUTANEOUS
  Filled 2014-08-15 (×3): qty 0.08

## 2014-08-15 MED ORDER — CLONIDINE HCL 0.3 MG/24HR TD PTWK: 0.3000 mg | MEDICATED_PATCH | TRANSDERMAL | Status: DC

## 2014-08-15 MED ORDER — HYDRALAZINE HCL 20 MG/ML IJ SOLN
20.0000 mg | Freq: Three times a day (TID) | INTRAMUSCULAR | Status: DC
  Administered 2014-08-15 – 2014-08-17 (×9): 20 mg via INTRAVENOUS
  Filled 2014-08-15 (×9): qty 1

## 2014-08-15 MED ORDER — ISOSORBIDE DINITRATE 10 MG PO TABS
10.0000 mg | ORAL_TABLET | Freq: Three times a day (TID) | ORAL | Status: DC
  Administered 2014-08-15 – 2014-08-20 (×14): 10 mg via ORAL
  Filled 2014-08-15 (×16): qty 1

## 2014-08-15 MED ORDER — JEVITY 1.2 CAL PO LIQD
1000.0000 mL | ORAL | Status: DC
  Administered 2014-08-15: 19:00:00
  Filled 2014-08-15 (×7): qty 1000

## 2014-08-15 NOTE — Progress Notes (Signed)
Patient ID: [name] [name]  female  ZOX:[id number]    DOB: [birth year]    DOA: 08/06/2014  PCP: Alva Garnet., MD  Brief narrative:  Patient is a 73 year old with prior CVAs, hypertension, DM, hypothyroidism who presented to the hospital secondary to inability to speak. Upon further evaluation was found to have a new stroke. The patient has failed bedside swallow evaluation and after discussion with POA, palliative meeting, requested PEG tube placement as part of their goals of care.   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 - Patient was on Plavix 75 mg daily prior to admission, switched to Aggrenox by neurology - Failed swallow evaluation, awaiting PEG tube placement-> IR and GI unable to place, surgery consult called  - PT recommended skilled nursing facility  Active Problems:  Hypernatremia improving - Placed on D5 drip    DM type 2, uncontrolled, with renal complications - Continue CBGs every 4 hours, continue sliding scale insulin , inc Lantus to 8 units    CKD (chronic kidney disease), stage III - Currently stable    Hypothyroidism - Change to IV Synthroid    COPD (chronic obstructive pulmonary disease) - Currently stable   Dysphagia, pharyngoesophageal phase - Awaiting PEG tube placement   DVT Prophylaxis:  Code Status:  Family Communication: Discussed with patient's husband at the bedside  Disposition: PEG tube placement pending, after PEG tube is functional, will transition to SNF  Consultants:  Neurology  Palliative care   Antibiotics:  None    Subjective: Aphasia, nonverbal somewhat more alert and awake  today  Objective: Weight change:   Intake/Output Summary (Last 24 hours) at 08/15/14 1052 Last data filed at 08/14/14 1738  Gross per 24 hour  Intake     10 ml  Output      0 ml  Net     10 ml   Blood pressure 147/44, pulse 90, temperature 98.2 F (36.8 C), temperature source Oral, resp. rate 18, height 5\' 3"  (1.6 m), weight 76 kg (167 lb 8.8 oz), SpO2 96.00%.  Physical Exam: General: Alert and awake awake, NAD , aphasia, does not follow commands CVS: S1-S2 clear, no murmur rubs or gallops Chest: CTAB Abdomen: soft nontender, nondistended, normal bowel sounds  Extremities: no cyanosis, clubbing or edema noted bilaterally Neuro: Does not cooperate with exam  Lab Results: Basic Metabolic Panel:  Recent Labs Lab 08/14/14 0500 08/15/14 0442  NA 151* 147  K 4.3 3.8  CL 113* 110  CO2 24 26  GLUCOSE 185* 229*  BUN 12 10  CREATININE 1.19* 1.04  CALCIUM 8.4 8.5   Liver Function Tests: No results found for this basename: AST, ALT, ALKPHOS, BILITOT, PROT, ALBUMIN,  in the last 168 hours No results found for this basename: LIPASE, AMYLASE,  in the last 168 hours No results found for this basename: AMMONIA,  in the last 168 hours CBC:  Recent Labs Lab 08/13/14 0830  WBC 4.6  HGB 9.8*  HCT 31.3*  MCV 92.1  PLT 265   Cardiac Enzymes: No results found for this basename: CKTOTAL, CKMB, CKMBINDEX, TROPONINI,  in the last 168 hours BNP: No components found with this basename: POCBNP,  CBG:  Recent Labs Lab  08/14/14 1622 08/14/14 2034 08/15/14 0054 08/15/14 0404 08/15/14 0740  GLUCAP 244* 177* 180* 203* 225*     Micro Results: No results found for this or any previous visit (from the past 240 hour(s)).  Studies/Results: Ct Head Wo Contrast  08/06/2014   CLINICAL DATA:  Code stroke.  Facial droop.  EXAM: CT HEAD WITHOUT CONTRAST  TECHNIQUE: Contiguous axial images were obtained from the base of the skull through the vertex without intravenous contrast.   COMPARISON:  08/04/2014  FINDINGS: Diffuse cerebral atrophy. Ventricular dilatation consistent with central atrophy. Low-attenuation changes throughout the deep white matter consistent with small vessel ischemia. Old lacunar infarcts in the right caudate lobe, left thalamus, and left cerebellum. No change since previous study. No mass effect or midline shift. No abnormal extra-axial fluid collections. Gray-white matter junctions are distinct. Basal cisterns are not effaced. No evidence of acute intracranial hemorrhage. No depressed skull fractures. Small retention cyst in the sphenoid sinus. Mastoid air cells are not opacified.  IMPRESSION: No acute intracranial abnormalities. Chronic atrophy and small vessel ischemic changes with multiple old lacunar infarcts.  These results were called by telephone at the time of interpretation on 08/06/2014 at 10:52 pm to Dr. Vanetta Mulders , who verbally acknowledged these results.   Electronically Signed   By: Burman Nieves M.D.   On: 08/06/2014 22:54   Ct Head Wo Contrast  08/04/2014   CLINICAL DATA:  Facial droop and left upper extremity paresis.  EXAM: CT HEAD WITHOUT CONTRAST  TECHNIQUE: Contiguous axial images were obtained from the base of the skull through the vertex without intravenous contrast.  COMPARISON:  CT scan of July 24, 2014.  FINDINGS: Bony calvarium appears intact. Mild diffuse cortical atrophy is noted. Moderate chronic ischemic white matter disease is noted. Stable calcifications are noted in the basal ganglia bilaterally. No mass effect or midline shift is noted. Ventricular size is within normal limits. There is no evidence of mass lesion, hemorrhage or acute infarction.  IMPRESSION: Mild diffuse cortical atrophy. Moderate chronic ischemic white matter disease. No acute intracranial abnormality seen.   Electronically Signed   By: Roque Lias M.D.   On: 08/04/2014 14:02   Ct Head Wo Contrast  07/24/2014   CLINICAL DATA:  Elevated blood  glucose, hypertensive, mental status change  EXAM: CT HEAD WITHOUT CONTRAST  TECHNIQUE: Contiguous axial images were obtained from the base of the skull through the vertex without intravenous contrast.  COMPARISON:  06/12/2014  FINDINGS: There is no evidence of mass effect, midline shift, or extra-axial fluid collections. There is no evidence of a space-occupying lesion or intracranial hemorrhage. There is no evidence of a cortical-based area of acute infarction. There is an old right basal ganglia and bilateral thalamic lacunar infarct. There is generalized cerebral atrophy. There is periventricular white matter low attenuation likely secondary to microangiopathy.  The ventricles and sulci are appropriate for the patient's age. The basal cisterns are patent.  Visualized portions of the orbits are unremarkable. The visualized portions of the paranasal sinuses and mastoid air cells are unremarkable. Cerebrovascular atherosclerotic calcifications are noted.  The osseous structures are unremarkable.  IMPRESSION: No acute intracranial pathology.   Electronically Signed   By: Elige Ko   On: 07/24/2014 11:06   Mr Maxine Glenn Head Wo Contrast  08/08/2014   CLINICAL DATA:  Sudden onset of inability to speak beginning yesterday. Symptoms have now resolved. Stroke risk factors include previous stroke, diabetes, hypertension, hyperlipidemia, and chronic renal disease.  EXAM: MRI HEAD WITHOUT  CONTRAST  MRA HEAD WITHOUT CONTRAST  TECHNIQUE: Multiplanar, multiecho pulse sequences of the brain and surrounding structures were obtained without intravenous contrast. Angiographic images of the head were obtained using MRA technique without contrast.  COMPARISON:  CT head 08/06/2014.  MR head 04/24/14  FINDINGS: MRI HEAD FINDINGS  Subcentimeter acute infarct in the LEFT corpus callosum just lateral and superior to the genu. This lies within the LEFT ACA territory. No other areas of acute infarction are identified. No hemorrhage, mass  lesion, or extra-axial fluid.  Hydrocephalus ex vacuo. Extensive small vessel disease throughout the periventricular and subcortical white matter. Widespread remote basal ganglia, thalamic, and deep white matter infarcts some of which were acute on the previous study from June. Partial empty sella. Cervical spondylosis. No osseous lesions. BILATERAL cataract extraction. Chronic sinus and mastoid fluid.  MRA HEAD FINDINGS  Misregistration of the process images precludes accurate stenosis evaluation. There is gross patency of the internal carotid arteries and basilar artery with RIGHT vertebral dominant. Suspected moderate RIGHT cavernous ICA disease and mild LEFT cavernous ICA disease suspected mild disease of the distal LEFT vertebral. There is no proximal flow reducing lesion of the anterior, middle, or posterior cerebral arteries. Both distal anterior cerebral arteries appear patent.  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  Advanced atrophy, small vessel disease, and widespread remote deep nuclei and deep white matter infarcts.  Patient motion results in misregistration of the MRA exam. Gross patency of the intracranial vasculature, including both anterior cerebral arteries, is established however.   Electronically Signed   By: Davonna BellingJohn  Curnes M.D.   On: 08/08/2014 09:54   Mr Brain Wo Contrast  08/08/2014   CLINICAL DATA:  Sudden onset of inability to speak beginning yesterday. Symptoms have now resolved. Stroke risk factors include previous stroke, diabetes, hypertension, hyperlipidemia, and chronic renal disease.  EXAM: MRI HEAD WITHOUT CONTRAST  MRA HEAD WITHOUT CONTRAST  TECHNIQUE: Multiplanar, multiecho pulse sequences of the brain and surrounding structures were obtained without intravenous contrast. Angiographic images of the head were obtained using MRA technique without contrast.  COMPARISON:  CT head 08/06/2014.  MR head 04/24/14  FINDINGS: MRI HEAD FINDINGS   Subcentimeter acute infarct in the LEFT corpus callosum just lateral and superior to the genu. This lies within the LEFT ACA territory. No other areas of acute infarction are identified. No hemorrhage, mass lesion, or extra-axial fluid.  Hydrocephalus ex vacuo. Extensive small vessel disease throughout the periventricular and subcortical white matter. Widespread remote basal ganglia, thalamic, and deep white matter infarcts some of which were acute on the previous study from June. Partial empty sella. Cervical spondylosis. No osseous lesions. BILATERAL cataract extraction. Chronic sinus and mastoid fluid.  MRA HEAD FINDINGS  Misregistration of the process images precludes accurate stenosis evaluation. There is gross patency of the internal carotid arteries and basilar artery with RIGHT vertebral dominant. Suspected moderate RIGHT cavernous ICA disease and mild LEFT cavernous ICA disease suspected mild disease of the distal LEFT vertebral. There is no proximal flow reducing lesion of the anterior, middle, or posterior cerebral arteries. Both distal anterior cerebral arteries appear patent.  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  Advanced atrophy, small vessel disease, and widespread remote deep nuclei and deep white matter infarcts.  Patient motion results in misregistration of the MRA exam. Gross patency of the intracranial vasculature, including both anterior cerebral arteries, is established however.   Electronically Signed   By:  Davonna Belling M.D.   On: 08/08/2014 09:54   Dg Chest Port 1 View  08/07/2014   CLINICAL DATA:  Short of breath. Labored in shallow breathing. Unresponsive.  EXAM: PORTABLE CHEST - 1 VIEW  COMPARISON:  07/29/2014  FINDINGS: Probable linear atelectasis in the left mid lung. Normal heart size and pulmonary vascularity. No focal airspace disease in the lungs. No blunting of costophrenic angles. No pneumothorax. Mediastinal contours appear  intact.  IMPRESSION: No active disease.   Electronically Signed   By: Burman Nieves M.D.   On: 08/07/2014 00:02   Dg Chest Port 1 View  07/29/2014   CLINICAL DATA:  Intubation.  EXAM: PORTABLE CHEST - 1 VIEW  COMPARISON:  07/28/2014.  FINDINGS: Endotracheal tube 1.9 cm above the carina. Left IJ line in NG tube in stable position. Cardiac monitor stable position. Heart size is stable. Pulmonary vascularity is normal. Bibasilar atelectasis and/or infiltrates are present. Small bilateral pleural effusions cannot be excluded. A mild component congestive heart failure cannot be excluded. No pneumothorax. No acute osseus abnormality.  IMPRESSION: 1. Interval reposition of endotracheal tube. Its tip is now 1.9 cm above the carina left IJ line, and NG tube in stable position. 2. Interval development of bibasilar infiltrates and possible tiny pleural effusions. Findings suggest mild CHF. Bibasilar pneumonia cannot be excluded.   Electronically Signed   By: Maisie Fus  Register   On: 07/29/2014 07:45   Dg Chest Portable 1 View  07/28/2014   CLINICAL DATA:  Endotracheal tube and orogastric tube placement  EXAM: PORTABLE CHEST - 1 VIEW  COMPARISON:  07/27/2014  FINDINGS: Endotracheal tube is seen with tip directly at the carina directed toward left main bronchus. Left internal jugular central line is seen with tip over the junction of the brachiocephalic vein and superior vena cava. There is no pneumothorax. NG tube crosses the gastroesophageal junction.  Right lung is clear. Partial consolidation left lower lobe most consistent with partial atelectasis, improved when compared to the prior study. When compared to prior study there is improved aeration in the left lower lobe.  IMPRESSION: Endotracheal tube at the carina. Consider retracting the tube 2-3 cm and repeating the image. Continued atelectasis left lower lobe, but improved when compared to prior study. These results were called by telephone at the time of  interpretation on 07/28/2014 at 2:23 am to Joy, the patient's charge nurse, who verbally acknowledged these results.   Electronically Signed   By: Esperanza Heir M.D.   On: 07/28/2014 02:23   Dg Chest Port 1 View  07/27/2014   CLINICAL DATA:  Pneumonitis.  EXAM: PORTABLE CHEST - 1 VIEW  COMPARISON:  07/26/2014.  FINDINGS: Endotracheal tube ends between the clavicular heads and carina. Left IJ catheter is likely stable in positioning, although there is new distortion from leftward rotation. Orogastric tube enters the stomach at least.  There is no change in heart size and mediastinal contours when accounting for rotation. Implantable cardiac monitor has a stable orientation.  Worsening basilar lung aeration. Hazy appearance favors atelectasis and possibly small pleural effusion. There is no edema or pneumothorax.  IMPRESSION: 1. Stable positioning of tubes and central line. 2. Significant improvement and left upper lobe aeration. Rapid clearing favors pneumonitis or atelectasis over pneumonia. 3. New bibasilar atelectasis.   Electronically Signed   By: Tiburcio Pea M.D.   On: 07/27/2014 06:25   Dg Chest Port 1 View  07/26/2014   CLINICAL DATA:  Ventilator dependent respiratory failure. Followup left upper lobe  pneumonia.  EXAM: PORTABLE CHEST - 1 VIEW  COMPARISON:  Portable chest x-rays yesterday, 07/24/2014, 06/13/2014.  FINDINGS: Endotracheal tube tip in satisfactory position projecting approximately 4-5 cm above the carina. Nasogastric tube courses below the diaphragm into the stomach. Left jugular sent venous catheter tip projects over the upper SVC, unchanged.  Cardiac silhouette upper normal in size to slightly enlarged but stable. Airspace consolidation in the left upper lobe, slightly worse than on yesterday's examination. No new pulmonary parenchymal abnormalities.  IMPRESSION: Support apparatus satisfactory. Slight worsening of left upper lobe pneumonia since yesterday. No new abnormalities.    Electronically Signed   By: Hulan Saas M.D.   On: 07/26/2014 09:10   Portable Chest Xray In Am  07/25/2014   CLINICAL DATA:  Follow-up endotracheal tube position.  EXAM: PORTABLE CHEST - 1 VIEW  COMPARISON:  07/24/2014  FINDINGS: Endotracheal tube remains in place with tip approximately 2-2.5 cm above the carina. Left jugular central venous catheter is unchanged with tip near the brachiocephalic vein confluence. Enteric tube courses into the left upper abdomen. Loop recorder is again seen. Cardiac silhouette is within normal limits for size. Left perihilar opacity has decreased from the prior study. Right lung remains clear. No definite pleural effusion or pneumothorax is identified.  IMPRESSION: 1. Endotracheal tube 2-2.5 cm above the carina. 2. Improved left upper lobe aeration.   Electronically Signed   By: Sebastian Ache   On: 07/25/2014 09:13   Portable Chest Xray  07/24/2014   CLINICAL DATA:  Interval intubation of the patient ; assess support tube and line. Placement  EXAM: PORTABLE CHEST - 1 VIEW  COMPARISON:  Portable chest x-ray of today's date.  FINDINGS: The endotracheal tube tip lies 2 cm above the crotch of the carina. The right internal jugular venous catheter tip lies at the level of the junction of the right and left brachiocephalic veins. The esophagogastric tube tip projects below the inferior margin of the image. A loop recorder is visible over the left atrial region. The cardiac silhouette is normal in size. The pulmonary vascularity is not engorged. There is confluent alveolar opacity in the left perihilar region that is more conspicuous than on the earlier study. The bony thorax exhibits no acute abnormality.  IMPRESSION: 1. The endotracheal tube tip lies 2.1 cm above the crotch of the carina. Withdrawal by 2-3 cm is recommended to avoid accidental mainstem bronchus intubation. 2. The left internal jugular venous catheter and the esophagogastric tube are in appropriate position  radiographically. There is no postprocedure pneumothorax or hemothorax. 3. There is increased alveolar opacity in the low left perihilar region consistent with pneumonia. 4. These results were called by telephone at the time of interpretation on 07/24/2014 at 7:21 pm to [facility], RN, who verbally acknowledged these results.   Electronically Signed   By: David  [place]   On: 07/24/2014 19:26   Dg Chest Port 1 View  07/24/2014   CLINICAL DATA:  Recent aspiration  EXAM: PORTABLE CHEST - 1 VIEW  COMPARISON:  06/13/2014  FINDINGS: The cardiac shadow is stable. Monitoring device is again seen. The right lung is clear. The left lung demonstrates patchy infiltrate in the upper lobe which may be related to the patient's recent aspiration.  IMPRESSION: Left upper lobe infiltrate. Followup films to resolution are recommended.   Electronically Signed   By: Alcide Clever M.D.   On: 07/24/2014 11:09   Dg Abd Portable 1v  07/30/2014   CLINICAL DATA:  Feeding tube placement  EXAM: PORTABLE ABDOMEN - 1 VIEW  COMPARISON:  Portable exam 1642 hr compared to 07/28/2014  FINDINGS: Feeding tube coiled at mid stomach with tip reflected to fundus.  Nonobstructive bowel gas pattern.  Minimal atelectasis and questionable fusions at lung bases.  Loop recorder projects over chest.  IMPRESSION: Feeding tube coiled at mid to proximal stomach.   Electronically Signed   By: Ulyses Southward M.D.   On: 07/30/2014 16:55   Dg Abd Portable 1v  07/28/2014   CLINICAL DATA:  Endotracheal tube and orogastric tube placement  EXAM: PORTABLE ABDOMEN - 1 VIEW  COMPARISON:  None.  FINDINGS: Orogastric tube crosses the gastroesophageal junction with tip in the left upper quadrant in the anticipated position of stomach. Endotracheal tube is also seen on this study with tip about 12 mm above the carinal.  IMPRESSION: Endotracheal tube and OG tube as described.   Electronically Signed   By: Esperanza Heir M.D.   On: 07/28/2014 02:24   Dg Swallowing  Func-speech Pathology  08/01/2014   Riley Nearing Deblois, CCC-SLP     08/01/2014  1:43 PM Objective Swallowing Evaluation: Modified Barium Swallowing Study   Patient Details  Name: SHILPA BUSHEE MRN: [id number] Date of Birth: [birth year]  Today's Date: 08/01/2014 Time: 1300-1330 SLP Time Calculation (min): 30 min  Past Medical History:  Past Medical History  Diagnosis Date  . Hypertension   . Diabetes mellitus   . Chronic renal insufficiency   . Hyperlipidemia   . Osteopenia   . CVA (cerebral infarction)   . Thyroid disease    Past Surgical History:  Past Surgical History  Procedure Laterality Date  . Eye surgery    . Loop recorder implant  03-24-2014    MDT LinQ implanted by Dr Johney Frame for cryptogenic stroke  . Tee without cardioversion N/A 03/23/2014    Procedure: TRANSESOPHAGEAL ECHOCARDIOGRAM (TEE);  Surgeon:  Lars Masson, MD;  Location: [facility] ENDOSCOPY;  Service:  Cardiovascular;  Laterality: N/A;   HPI:  [name] with COPD, CVA, CK D., Diabetes admitted 9/18  with acute respiratory distress , hypercarbic respiratory  failure, DKA with glucose of 802 and anion gap of 19 , requiring  mechanical ventilation. Chest x-ray showed left upper lobe  infiltrate. Was placed on BiPAP initially, but unresponsive on  arrival to the ICU, hence intubated. She had recent admit from  8/6 to 814 for hypercarbic failure that improved with BiPAP-dc'd  to NH , then home since 9/14. 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 Patient has been seen by SLP  during previous admissions and has been observed with a delayed  oral phase and suspected delayed swallow initiation with s/s of  aspiration with thin liquids via straw. Most frequent diet  recommendations for dysphagia 1 solids, thin liquids.      Assessment / Plan / Recommendation Clinical Impression  Dysphagia Diagnosis: Moderate oral phase dysphagia;Mild  pharyngeal phase dysphagia Clinical impression: Pt presents with a primary cognitive based,  oral  dyspahgia with lingual pumping and piecemeal transit of PO,  leading to multiple swallows per bolus. Swallow response is only  slightly delayed but mild base of tongue weakness results in  trace vallecular residuals and very trace penetration after the  swallow with nectar thick liquids. Pt senses penetrate and expels  with a soft throat clear. Given overt aspiration of thin at  bedside and pts AMS, thin liquids not tested though expect pt  will be able to upgrade as  mentation improves. Pt to initiate dys  1 (puree) diet and nectar thick liquids. SLP will follow for  tolerance.      Treatment Recommendation  Therapy as outlined in treatment plan below    Diet Recommendation Dysphagia 1 (Puree);Nectar-thick liquid   Liquid Administration via: Cup;Straw Medication Administration: Whole meds with puree Supervision: Staff to assist with self feeding Compensations: Slow rate;Small sips/bites Postural Changes and/or Swallow Maneuvers: Seated upright 90  degrees;Upright 30-60 min after meal    Other  Recommendations Oral Care Recommendations: Oral care BID Other Recommendations: Order thickener from pharmacy   Follow Up Recommendations  Skilled Nursing facility    Frequency and Duration min 2x/week  2 weeks   Pertinent Vitals/Pain NA    SLP Swallow Goals     General HPI: [name] with COPD, CVA, CK D., Diabetes  admitted 9/18 with acute respiratory distress , hypercarbic  respiratory failure, DKA with glucose of 802 and anion gap of 19  , requiring mechanical ventilation. Chest x-ray showed left upper  lobe infiltrate. Was placed on BiPAP initially, but unresponsive  on arrival to the ICU, hence intubated. She had recent admit from  8/6 to 814 for hypercarbic failure that improved with BiPAP-dc'd  to NH , then home since 9/14. 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 Patient has been seen by SLP  during previous admissions and has been observed with a delayed  oral phase and suspected delayed  swallow initiation with s/s of  aspiration with thin liquids via straw. Most frequent diet  recommendations for dysphagia 1 solids, thin liquids.  Type of Study: Modified Barium Swallowing Study Reason for Referral: Objectively evaluate swallowing function Previous Swallow Assessment: see HPI Diet Prior to this Study: NPO Temperature Spikes Noted: No Respiratory Status: Room air History of Recent Intubation: Yes Length of Intubations (days): 2 days Date extubated: 07/29/14 Behavior/Cognition: Alert;Cooperative;Requires cueing Oral Cavity - Dentition: Edentulous Oral Motor / Sensory Function: Within functional limits Self-Feeding Abilities: Total assist Patient Positioning: Upright in chair Baseline Vocal Quality: Clear Volitional Cough: Weak Volitional Swallow: Able to elicit Pharyngeal Secretions: Not observed secondary MBS    Reason for Referral Objectively evaluate swallowing function   Oral Phase Oral Preparation/Oral Phase Oral Phase: Impaired Oral - Nectar Oral - Nectar Cup: Lingual pumping;Reduced posterior  propulsion;Piecemeal swallowing;Delayed oral transit Oral - Nectar Straw: Lingual pumping;Reduced posterior  propulsion;Piecemeal swallowing;Delayed oral transit Oral - Solids Oral - Puree: Lingual pumping;Reduced posterior  propulsion;Piecemeal swallowing;Delayed oral transit   Pharyngeal Phase Pharyngeal Phase Pharyngeal Phase: Impaired Pharyngeal - Nectar Pharyngeal - Nectar Cup: Reduced tongue base  retraction;Penetration/Aspiration after swallow;Pharyngeal  residue - valleculae Penetration/Aspiration details (nectar cup): Material enters  airway, CONTACTS cords then ejected out;Material does not enter  airway Pharyngeal - Nectar Straw: Reduced tongue base  retraction;Penetration/Aspiration after swallow;Pharyngeal  residue - valleculae Penetration/Aspiration details (nectar straw): Material does not  enter airway;Material enters airway, CONTACTS cords then ejected  out Pharyngeal - Solids Pharyngeal  - Puree: Reduced tongue base retraction;Pharyngeal  residue - valleculae Penetration/Aspiration details (puree): Material does not enter  airway  Cervical Esophageal Phase    GO    Cervical Esophageal Phase Cervical Esophageal Phase: Impaired Cervical Esophageal Phase - Comment Cervical Esophageal Comment: Appearance of slow transit, stasis  through GE junction        [employer], MA CCC-SLP [phone]  DeBlois, Riley Nearing 08/01/2014, 1:42 PM     Medications: Scheduled Meds: . antiseptic oral rinse  7 mL Mouth Rinse  [id number]  . atorvastatin  40 mg Oral Daily  . budesonide-formoterol  2 puff Inhalation BID  . chlorhexidine  15 mL Mouth Rinse BID  . cloNIDine  0.3 mg Transdermal Weekly  . dipyridamole-aspirin  1 capsule Oral BID  . heparin  5,000 Units Subcutaneous 3 times per day  . hydrALAZINE  20 mg Intravenous Q8H  . insulin aspart  0-9 Units Subcutaneous TID WC  . insulin glargine  5 Units Subcutaneous Daily  . levothyroxine  25 mcg Intravenous Daily      LOS: 9 days   Branch Pacitti M.D. [facility] 08/15/2014, 10:52 AM Pager: [phone]  If 7PM-7AM, please contact night-coverage [email or web address] Password [email or web address]

## 2014-08-15 NOTE — Progress Notes (Signed)
Patient BP at 2108 was 207/74 schedule it continues to run high at 0400 the manual reading was 200/60 I have given her 10 mg of Hydralazine twice and does not have any effect will page [facility] at this time.

## 2014-08-15 NOTE — Consult Note (Signed)
[name] [name] 1941-07-31  [id number].   Requesting MD: Dr. Estill Cotta Chief Complaint/Reason for Consult: dysphagia, placement of open g tube HPI: This is a 73 yo black female with multiple medical problems who has had several prior CVAs. She was recently admitted per her chart for respiratory failure that required intubation.  She improved after this and was readmitted several days ago due to a new CVA.  She now is minimally communicative and weak.  She now has dysphagia.  She had a palliative care meeting which made her a DNR/DNI.  She is supposed to be DC to a SNF with hospice referral.  GI and IR were unable to place a PEG which was requested by the family.  They were both unable to do their procedure due to an overlying colon.  We have been asked to see her for open g-tube placement  ROS : unable to obtain secondary to minimal communication and inability to engage in conversation.  Family History  Problem Relation Age of Onset  . Diabetes Mother   . Hypertension Mother     Past Medical History  Diagnosis Date  . Hypertension   . Diabetes mellitus   . Chronic renal insufficiency   . Hyperlipidemia   . Osteopenia   . CVA (cerebral infarction)   . Thyroid disease     Past Surgical History  Procedure Laterality Date  . Eye surgery    . 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 Tee without cardioversion N/A 03/23/2014    Procedure: TRANSESOPHAGEAL ECHOCARDIOGRAM (TEE);  Surgeon: Dorothy Spark, MD;  Location: [facility];  Service: Cardiovascular;  Laterality: N/A;    Social History:  reports that she quit smoking about 2 months ago. Her smoking use included Cigarettes. She has a 30 pack-year smoking history. She has never used smokeless tobacco. She reports that she drinks alcohol. She reports that she does not use illicit drugs.  Allergies:  Allergies  Allergen Reactions  . Erythromycin Shortness Of Breath  . Iodine  Hives  . Penicillins Hives  . Latex Hives and Itching    Medications Prior to Admission  Medication Sig Dispense Refill  . acetaminophen (TYLENOL) 325 MG tablet Take 650 mg by mouth every 6 (six) hours as needed for mild pain.      Marland Kitchen albuterol (PROVENTIL) (2.5 MG/3ML) 0.083% nebulizer solution Take 2.5 mg by nebulization every 6 (six) hours as needed for wheezing or shortness of breath.      Marland Kitchen atorvastatin (LIPITOR) 40 MG tablet Take 40 mg by mouth daily.      . budesonide-formoterol (SYMBICORT) 80-4.5 MCG/ACT inhaler Inhale 2 puffs into the lungs 2 (two) times daily.      . carvedilol (COREG) 25 MG tablet Take 25 mg by mouth 2 (two) times daily with a meal.      . cloNIDine (CATAPRES - DOSED IN MG/24 HR) 0.3 mg/24hr patch Place 0.3 mg onto the skin once a week.      . cloNIDine (CATAPRES) 0.1 MG tablet Take 0.1 mg by mouth 3 (three) times daily.      . clopidogrel (PLAVIX) 75 MG tablet Take 75 mg by mouth daily.      . hydrALAZINE (APRESOLINE) 100 MG tablet Take 100 mg by mouth 3 (three) times daily.      . insulin aspart (NOVOLOG) 100 UNIT/ML injection Inject 0-15 Units into the skin 3 (three) times daily before meals. *70-120=0  units, 121-150=3 units, 151-200=4 units, 201-250=7 units, 251-300=11 units, 301-350=15 units, greater than 400 call MD*      . insulin glargine (LANTUS) 100 UNIT/ML injection Inject 13 Units into the skin at bedtime.      . isosorbide dinitrate (ISORDIL) 10 MG tablet Take 10 mg by mouth 3 (three) times daily.      Marland Kitchen levothyroxine (SYNTHROID, LEVOTHROID) 50 MCG tablet Take 50 mcg by mouth daily before breakfast.        Blood pressure 190/64, pulse 89, temperature 98.6 F (37 C), temperature source Oral, resp. rate 20, height _0  (1.6 m), weight 167 lb 8.8 oz (76 kg), SpO2 95.00%. Physical Exam: General: obese black female who is laying in bed in NAD with a blank stare on her face HEENT: head is normocephalic, atraumatic.  Sclera are noninjected.  PERRL.  Ears and  nose without any masses or lesions.  Mouth is pink. Right sided facial droop  Heart: regular, rate, and rhythm.  Normal s1,s2. No obvious murmurs, gallops, or rubs noted.  Palpable radial and pedal pulses bilaterally Lungs: CTAB, no wheezes, rhonchi, or rales noted.  Respiratory effort nonlabored Abd: soft, NT, ND, +BS, no masses, hernias, or organomegaly Psych: alert, but with a blank stare, only able to answer a few basic questions.      Results for orders placed during the hospital encounter of 08/06/14 (from the past 48 hour(s))  BASIC METABOLIC PANEL     Status: Abnormal   Collection Time    08/13/14  8:30 AM      Result Value Ref Range   Sodium 149 (*) 137 - 147 mEq/L   Potassium 4.5  3.7 - 5.3 mEq/L   Chloride 114 (*) 96 - 112 mEq/L   CO2 23  19 - 32 mEq/L   Glucose, Bld 181 (*) 70 - 99 mg/dL   BUN 12  6 - 23 mg/dL   Creatinine, Ser 1.17 (*) 0.50 - 1.10 mg/dL   Calcium 8.6  8.4 - 10.5 mg/dL   GFR calc non Af Amer 44 (*) >90 mL/min   GFR calc Af Amer 51 (*) >90 mL/min   Comment: (NOTE)     The eGFR has been calculated using the CKD EPI equation.     This calculation has not been validated in all clinical situations.     eGFR's persistently <90 mL/min signify possible Chronic Kidney     Disease.   Anion gap 12  5 - 15  CBC     Status: Abnormal   Collection Time    08/13/14  8:30 AM      Result Value Ref Range   WBC 4.6  4.0 - 10.5 K/uL   RBC 3.40 (*) 3.87 - 5.11 MIL/uL   Hemoglobin 9.8 (*) 12.0 - 15.0 g/dL   HCT 31.3 (*) 36.0 - 46.0 %   MCV 92.1  78.0 - 100.0 fL   MCH 28.8  26.0 - 34.0 pg   MCHC 31.3  30.0 - 36.0 g/dL   RDW 13.4  11.5 - 15.5 %   Platelets 265  150 - 400 K/uL  GLUCOSE, CAPILLARY     Status: Abnormal   Collection Time    08/13/14  8:39 AM      Result Value Ref Range   Glucose-Capillary 168 (*) 70 - 99 mg/dL  GLUCOSE, CAPILLARY     Status: Abnormal   Collection Time    08/13/14 11:51 AM      Result Value Ref  Range   Glucose-Capillary 178 (*) 70 -  99 mg/dL  GLUCOSE, CAPILLARY     Status: Abnormal   Collection Time    08/13/14  4:16 PM      Result Value Ref Range   Glucose-Capillary 189 (*) 70 - 99 mg/dL  GLUCOSE, CAPILLARY     Status: Abnormal   Collection Time    08/13/14  7:50 PM      Result Value Ref Range   Glucose-Capillary 184 (*) 70 - 99 mg/dL  GLUCOSE, CAPILLARY     Status: Abnormal   Collection Time    08/13/14 11:51 PM      Result Value Ref Range   Glucose-Capillary 160 (*) 70 - 99 mg/dL  GLUCOSE, CAPILLARY     Status: Abnormal   Collection Time    08/14/14  4:09 AM      Result Value Ref Range   Glucose-Capillary 169 (*) 70 - 99 mg/dL   Comment 1 Notify RN    BASIC METABOLIC PANEL     Status: Abnormal   Collection Time    08/14/14  5:00 AM      Result Value Ref Range   Sodium 151 (*) 137 - 147 mEq/L   Potassium 4.3  3.7 - 5.3 mEq/L   Chloride 113 (*) 96 - 112 mEq/L   CO2 24  19 - 32 mEq/L   Glucose, Bld 185 (*) 70 - 99 mg/dL   BUN 12  6 - 23 mg/dL   Creatinine, Ser 1.19 (*) 0.50 - 1.10 mg/dL   Calcium 8.4  8.4 - 10.5 mg/dL   GFR calc non Af Amer 44 (*) >90 mL/min   GFR calc Af Amer 50 (*) >90 mL/min   Comment: (NOTE)     The eGFR has been calculated using the CKD EPI equation.     This calculation has not been validated in all clinical situations.     eGFR's persistently <90 mL/min signify possible Chronic Kidney     Disease.   Anion gap 14  5 - 15  GLUCOSE, CAPILLARY     Status: Abnormal   Collection Time    08/14/14  7:50 AM      Result Value Ref Range   Glucose-Capillary 185 (*) 70 - 99 mg/dL   Comment 1 Notify RN    GLUCOSE, CAPILLARY     Status: Abnormal   Collection Time    08/14/14 11:17 AM      Result Value Ref Range   Glucose-Capillary 165 (*) 70 - 99 mg/dL   Comment 1 Notify RN    GLUCOSE, CAPILLARY     Status: Abnormal   Collection Time    08/14/14  4:22 PM      Result Value Ref Range   Glucose-Capillary 244 (*) 70 - 99 mg/dL   Comment 1 Notify RN    GLUCOSE, CAPILLARY      Status: Abnormal   Collection Time    08/14/14  8:34 PM      Result Value Ref Range   Glucose-Capillary 177 (*) 70 - 99 mg/dL   Comment 1 Documented in Chart     Comment 2 Notify RN    GLUCOSE, CAPILLARY     Status: Abnormal   Collection Time    08/15/14 12:54 AM      Result Value Ref Range   Glucose-Capillary 180 (*) 70 - 99 mg/dL  GLUCOSE, CAPILLARY     Status: Abnormal   Collection Time    08/15/14  4:04 AM      Result Value Ref Range   Glucose-Capillary 203 (*) 70 - 99 mg/dL  BASIC METABOLIC PANEL     Status: Abnormal   Collection Time    08/15/14  4:42 AM      Result Value Ref Range   Sodium 147  137 - 147 mEq/L   Potassium 3.8  3.7 - 5.3 mEq/L   Chloride 110  96 - 112 mEq/L   CO2 26  19 - 32 mEq/L   Glucose, Bld 229 (*) 70 - 99 mg/dL   BUN 10  6 - 23 mg/dL   Creatinine, Ser 1.04  0.50 - 1.10 mg/dL   Calcium 8.5  8.4 - 10.5 mg/dL   GFR calc non Af Amer 51 (*) >90 mL/min   GFR calc Af Amer 59 (*) >90 mL/min   Comment: (NOTE)     The eGFR has been calculated using the CKD EPI equation.     This calculation has not been validated in all clinical situations.     eGFR's persistently <90 mL/min signify possible Chronic Kidney     Disease.   Anion gap 11  5 - 15  GLUCOSE, CAPILLARY     Status: Abnormal   Collection Time    08/15/14  7:40 AM      Result Value Ref Range   Glucose-Capillary 225 (*) 70 - 99 mg/dL   Comment 1 Notify RN     Ir Fluoro Guide Cv Line Right  08/13/2014   INDICATION: Poor venous access. In need of intravenous access for medication administration and blood draws. 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 Request made for placement of a percutaneous gastrostomy tube.  EXAM: 1. ULTRASOUND AND FLUOROSCOPIC GUIDED PICC LINE INSERTION 2. FLUOROSCOPIC GUIDED RECTAL ADMINISTRATION OF BARIUM TO OPACIFY THE TRANSVERSE COLON.  COMPARISON:  CT abdomen pelvis - 08/12/2014  MEDICATIONS: None.  CONTRAST:  None  FLUOROSCOPY TIME:  3 min, 18 seconds   COMPLICATIONS: None immediate  TECHNIQUE: The procedure, risks, benefits, and alternatives were explained to the patient's son and informed written consent was obtained. A timeout was performed prior to the initiation of the procedure.  The right upper extremity was prepped with chlorhexidine in a sterile fashion, and a sterile drape was applied covering the operative field. Maximum barrier sterile technique with sterile gowns and gloves were used for the procedure. A timeout was performed prior to the initiation of the procedure. Local anesthesia was provided with 1% lidocaine.  Under direct ultrasound guidance, the right basilic vein was accessed with a micropuncture kit after the overlying soft tissues were anesthetized with 1% lidocaine. An ultrasound image was saved for documentation purposes. A guidewire was advanced to the level of the superior caval-atrial junction for measurement purposes and the PICC line was cut to length. A peel-away sheath was placed and a 36 cm, 5 [place], dual lumen was inserted to level of the superior caval-atrial junction. A post procedure spot fluoroscopic was obtained. The catheter easily aspirated and flushed and was sutured in place. A dressing was placed. The patient tolerated the procedure well without immediate post procedural complication.  At this point, attention was paid towards attempted percutaneous gastrostomy tube placement.  Multiple spot fluoroscopic images were obtained of the right upper abdominal quadrant appears to demonstrate the colon overlying the ventral aspect of the stomach as was demonstrated on preceding abdominal CT.  As such, barium was administered via the rectum with adequate opacification of the transverse  colon, confirming this finding. As such, attempted percutaneous gastrostomy tube placement was not performed  FINDINGS: After catheter placement, the PICC tip lies within the superior cavoatrial junction. The catheter aspirates and flushes  normally and is ready for immediate use.  The splenic flexure of the colon overlies the ventral wall of the stomach without a safe percutaneous window for percutaneous gastrostomy tube placement.  IMPRESSION: 1. Successful ultrasound and fluoroscopic guided placement of a right basilic vein approach, cm, 5 French, dual lumen PICC with tip at the superior caval-atrial junction. The PICC line is ready for immediate use. 2. No safe percutaneous window for gastrostomy tube placement.  PLAN: This patient could be referred to surgery for a surgical placed gastrostomy tube as clinically indicated.   Electronically Signed   By: Sandi Mariscal M.D.   On: 08/13/2014 11:09   Ir Fluoro Rm 30-60 Min  08/13/2014   INDICATION: Poor venous access. In need of intravenous access for medication administration and blood draws. 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 Request made for placement of a percutaneous gastrostomy tube.  EXAM: 1. ULTRASOUND AND FLUOROSCOPIC GUIDED PICC LINE INSERTION 2. FLUOROSCOPIC GUIDED RECTAL ADMINISTRATION OF BARIUM TO OPACIFY THE TRANSVERSE COLON.  COMPARISON:  CT abdomen pelvis - 08/12/2014  MEDICATIONS: None.  CONTRAST:  None  FLUOROSCOPY TIME:  3 min, 18 seconds  COMPLICATIONS: None immediate  TECHNIQUE: The procedure, risks, benefits, and alternatives were explained to the patient's son and informed written consent was obtained. A timeout was performed prior to the initiation of the procedure.  The right upper extremity was prepped with chlorhexidine in a sterile fashion, and a sterile drape was applied covering the operative field. Maximum barrier sterile technique with sterile gowns and gloves were used for the procedure. A timeout was performed prior to the initiation of the procedure. Local anesthesia was provided with 1% lidocaine.  Under direct ultrasound guidance, the right basilic vein was accessed with a micropuncture kit after the overlying soft tissues were anesthetized with 1%  lidocaine. An ultrasound image was saved for documentation purposes. A guidewire was advanced to the level of the superior caval-atrial junction for measurement purposes and the PICC line was cut to length. A peel-away sheath was placed and a 36 cm, 5 [place], dual lumen was inserted to level of the superior caval-atrial junction. A post procedure spot fluoroscopic was obtained. The catheter easily aspirated and flushed and was sutured in place. A dressing was placed. The patient tolerated the procedure well without immediate post procedural complication.  At this point, attention was paid towards attempted percutaneous gastrostomy tube placement.  Multiple spot fluoroscopic images were obtained of the right upper abdominal quadrant appears to demonstrate the colon overlying the ventral aspect of the stomach as was demonstrated on preceding abdominal CT.  As such, barium was administered via the rectum with adequate opacification of the transverse colon, confirming this finding. As such, attempted percutaneous gastrostomy tube placement was not performed  FINDINGS: After catheter placement, the PICC tip lies within the superior cavoatrial junction. The catheter aspirates and flushes normally and is ready for immediate use.  The splenic flexure of the colon overlies the ventral wall of the stomach without a safe percutaneous window for percutaneous gastrostomy tube placement.  IMPRESSION: 1. Successful ultrasound and fluoroscopic guided placement of a right basilic vein approach, cm, 5 French, dual lumen PICC with tip at the superior caval-atrial junction. The PICC line is ready for immediate use. 2. No safe percutaneous window for  gastrostomy tube placement.  PLAN: This patient could be referred to surgery for a surgical placed gastrostomy tube as clinically indicated.   Electronically Signed   By: Sandi Mariscal M.D.   On: 08/13/2014 11:09   Ir US Guide Vasc Access Right  08/14/2014   INDICATION: Poor venous access.  In need of intravenous access for medication administration and blood draws. 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 Request made for placement of a percutaneous gastrostomy tube.  EXAM: 1. ULTRASOUND AND FLUOROSCOPIC GUIDED PICC LINE INSERTION 2. FLUOROSCOPIC GUIDED RECTAL ADMINISTRATION OF BARIUM TO OPACIFY THE TRANSVERSE COLON.  COMPARISON:  CT abdomen pelvis - 08/12/2014  MEDICATIONS: None.  CONTRAST:  None  FLUOROSCOPY TIME:  3 min, 18 seconds  COMPLICATIONS: None immediate  TECHNIQUE: The procedure, risks, benefits, and alternatives were explained to the patient's son and informed written consent was obtained. A timeout was performed prior to the initiation of the procedure.  The right upper extremity was prepped with chlorhexidine in a sterile fashion, and a sterile drape was applied covering the operative field. Maximum barrier sterile technique with sterile gowns and gloves were used for the procedure. A timeout was performed prior to the initiation of the procedure. Local anesthesia was provided with 1% lidocaine.  Under direct ultrasound guidance, the right basilic vein was accessed with a micropuncture kit after the overlying soft tissues were anesthetized with 1% lidocaine. An ultrasound image was saved for documentation purposes. A guidewire was advanced to the level of the superior caval-atrial junction for measurement purposes and the PICC line was cut to length. A peel-away sheath was placed and a 36 cm, 5 [place], dual lumen was inserted to level of the superior caval-atrial junction. A post procedure spot fluoroscopic was obtained. The catheter easily aspirated and flushed and was sutured in place. A dressing was placed. The patient tolerated the procedure well without immediate post procedural complication.  At this point, attention was paid towards attempted percutaneous gastrostomy tube placement.  Multiple spot fluoroscopic images were obtained of the right upper abdominal quadrant  appears to demonstrate the colon overlying the ventral aspect of the stomach as was demonstrated on preceding abdominal CT.  As such, barium was administered via the rectum with adequate opacification of the transverse colon, confirming this finding. As such, attempted percutaneous gastrostomy tube placement was not performed  FINDINGS: After catheter placement, the PICC tip lies within the superior cavoatrial junction. The catheter aspirates and flushes normally and is ready for immediate use.  The splenic flexure of the colon overlies the ventral wall of the stomach without a safe percutaneous window for percutaneous gastrostomy tube placement.  IMPRESSION: 1. Successful ultrasound and fluoroscopic guided placement of a right basilic vein approach, cm, 5 French, dual lumen PICC with tip at the superior caval-atrial junction. The PICC line is ready for immediate use. 2. No safe percutaneous window for gastrostomy tube placement.  PLAN: This patient could be referred to surgery for a surgical placed gastrostomy tube as clinically indicated.   Electronically Signed   By: Sandi Mariscal M.D.   On: 08/13/2014 11:09       Assessment/Plan 1. Multiple CVAs 2. Dysphagia Patient Active Problem List   Diagnosis Date Noted  . CVA (cerebral infarction) 08/12/2014  . Palliative care encounter 08/11/2014  . DNR (do not resuscitate) discussion 08/11/2014  . Dysphagia, pharyngoesophageal phase 08/11/2014  . Aphasia 08/07/2014  . COPD (chronic obstructive pulmonary disease) 07/24/2014  . Type II or unspecified type diabetes mellitus  with renal manifestations, not stated as uncontrolled 07/17/2014  . Unspecified late effects of cerebrovascular disease 07/17/2014  . Diabetic neuropathy 07/17/2014  . Gastroesophageal reflux disease without esophagitis 07/16/2014  . Edema 07/14/2014  . Hypothyroidism 06/22/2014  . 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 Urinary retention 06/18/2014  . Acute  respiratory failure with hypoxia and hypercapnia 06/17/2014  . COPD with acute exacerbation 06/17/2014  . Type II or unspecified type diabetes mellitus with unspecified complication, uncontrolled 06/15/2014  . Respiratory acidosis 06/12/2014  . Encephalopathy acute 06/12/2014  . Dysphagia, unspecified(787.20) 05/05/2014  . Cough 05/05/2014  . Anemia 05/05/2014  . Acute CVA (cerebrovascular accident) 04/25/2014  . Acute on chronic renal failure 04/24/2014  . Metabolic encephalopathy 61/54/8845  . Dehydration 04/24/2014  . Patient non adherence 04/24/2014  . CKD (chronic kidney disease), stage III 03/21/2014  . History of CVA (cerebrovascular accident) 01/10/2014  . DM type 2, uncontrolled, with renal complications 73/34/4830  . Hypertension   . Hyperlipidemia   . Peripheral vascular disease, unspecified 05/13/2013   Plan: 1. I called the son who is the patient's HCPOA.  I discussed with him that is sounds like the plan is for SNF with hospice referral.  I also discussed with him that we would have to do an open g-tube which means general anesthesia, intubation, etc.  This comes with more significant risks that a PEG tube, such as worsening of her CVA, MI, etc.  I also explained that this is a surgery that would require an incision on her abdomen.  I told him with the plans being after discharge for referral to hospice, he needed to think about whether this is something that he would still like to pursue given his mother's current state of health and her health history.  He said he would think about this and let us know.  If he so chooses to proceed, we would not be able to proceed until early this coming week.  We will check back in with the patient's son after he makes his decision.  Pryor Guettler E 08/15/2014, 8:28 AM Pager: [phone]

## 2014-08-15 NOTE — Consult Note (Signed)
Agree with above. Family decision pending.  Wilmon ArmsMatthew K. Corliss Skainssuei, MD, [facility]  General/ Trauma Surgery  08/15/2014 8:46 AM

## 2014-08-15 NOTE — Progress Notes (Signed)
Orders for placement of feeding tube per MD, using aseptic technique placed small bore feeding tube down right nares, pt. Tolerated without distress, awaiting abdominal x-ray to verify placement.

## 2014-08-16 LAB — GLUCOSE, CAPILLARY
GLUCOSE-CAPILLARY: 218 mg/dL — AB (ref 70–99)
GLUCOSE-CAPILLARY: 255 mg/dL — AB (ref 70–99)
Glucose-Capillary: 194 mg/dL — ABNORMAL HIGH (ref 70–99)
Glucose-Capillary: 243 mg/dL — ABNORMAL HIGH (ref 70–99)
Glucose-Capillary: 262 mg/dL — ABNORMAL HIGH (ref 70–99)
Glucose-Capillary: 282 mg/dL — ABNORMAL HIGH (ref 70–99)

## 2014-08-16 MED ORDER — STROKE: EARLY STAGES OF RECOVERY BOOK
Freq: Once | Status: AC
  Administered 2014-08-16: 05:00:00
  Filled 2014-08-16: qty 1

## 2014-08-16 MED ORDER — LORAZEPAM 2 MG/ML IJ SOLN: 0.5000 mg | Freq: Four times a day (QID) | INTRAMUSCULAR | Status: DC | PRN

## 2014-08-16 NOTE — Plan of Care (Signed)
Problem: Progression Outcomes Goal: Tolerating diet/TF at goal rate Outcome: Completed/Met Date Met:  08/16/14 Pt. Tolerating feed well goal rate at 55 ml/hr.     

## 2014-08-16 NOTE — Progress Notes (Signed)
Patient ID: [name] [name]  female  ZOX:[id number]    DOB: January 25, 1941    DOA: 08/06/2014  PCP: Alva GarnetSHELTON,KIMBERLY R., MD  Brief narrative:  Patient is a 73 year old with prior CVAs, hypertension, DM, hypothyroidism who presented to the hospital secondary to inability to speak. Upon further evaluation was found to have a new stroke. The patient has failed bedside swallow evaluation and after discussion with POA, palliative meeting, requested PEG tube placement as part of their goals of care.   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 - Patient was on Plavix 75 mg daily prior to admission, HOLD aggrenox - Failed swallow evaluation, awaiting PEG tube placement-> IR and GI unable to place, surgery to do open PEG tube. Patient had not received Aggrenox until the first dose yesterday after panda tube was placed. Will HOLD Aggrenox on aspirin for PEG tube placement. - PT recommended skilled nursing facility  Active Problems:  Hypernatremia improving - Tolerating tube feeds, discontinue IV fluids    DM type 2, uncontrolled, with renal complications - Continue CBGs every 4 hours, continue sliding scale insulin , inc Lantus to 8 units    CKD (chronic kidney disease), stage III - Currently stable    Hypothyroidism - Change to IV Synthroid    COPD (chronic obstructive pulmonary disease) - Currently stable   Dysphagia, pharyngoesophageal phase - Awaiting PEG tube placement   DVT Prophylaxis:  Code Status:  Family Communication: Discussed with patient's husband at the bedside  Disposition: PEG tube placement pending, after PEG tube is functional, will transition to  SNF  Consultants:  Neurology  Palliative care   Antibiotics:  None    Subjective: Aphasia, nonverbal. However , per nursing, she is intermittently awake and agitated   Objective: Weight change:   Intake/Output Summary (Last 24 hours) at 75/11/15 0941 Last data filed at 75/11/15 0656  Gross per 24 hour  Intake 361.66 ml  Output     55 ml  Net 306.66 ml   Blood pressure 153/60, pulse 94, temperature 98.6 F (37 C), temperature source Axillary, resp. rate 20, height 5\' 3"  (1.6 m), weight 76 kg (167 lb 8.8 oz), SpO2 98.00%.  Physical Exam: General: Alert and awake awake, NAD , aphasia, does not follow commands CVS: S1-S2 clear, no murmur rubs or gallops Chest: CTAB Abdomen: soft nontender, nondistended, normal bowel sounds  Extremities: no cyanosis, clubbing or edema noted bilaterally Neuro: Does not cooperate with exam  Lab Results: Basic Metabolic Panel:  Recent Labs Lab 08/14/14 0500 08/15/14 0442  NA 151* 147  K 4.3 3.8  CL 113* 110  CO2 24 26  GLUCOSE 185* 229*  BUN 12 10  CREATININE 1.19* 1.04  CALCIUM 8.4 8.5   Liver Function Tests: No results found for this basename: AST, ALT, ALKPHOS, BILITOT, PROT, ALBUMIN,  in the last 168 hours No results found for this basename: LIPASE, AMYLASE,  in the last 168 hours No results found for this basename: AMMONIA,  in the last 168 hours CBC:  Recent Labs Lab 08/13/14 0830  WBC 4.6  HGB 9.8*  HCT 31.3*  MCV 92.1  PLT 265   Cardiac Enzymes: No results found for this basename: CKTOTAL, CKMB, CKMBINDEX,  TROPONINI,  in the last 168 hours BNP: No components found with this basename: POCBNP,  CBG:  Recent Labs Lab 08/15/14 1613 08/15/14 2025 08/16/14 0023 08/16/14 0434 08/16/14 0757  GLUCAP 195* 166* 194* 218* 255*     Micro Results: No results found for this or any previous visit (from the past 240 hour(s)).  Studies/Results: Ct Head Wo Contrast  08/06/2014   CLINICAL DATA:  Code stroke.   Facial droop.  EXAM: CT HEAD WITHOUT CONTRAST  TECHNIQUE: Contiguous axial images were obtained from the base of the skull through the vertex without intravenous contrast.  COMPARISON:  08/04/2014  FINDINGS: Diffuse cerebral atrophy. Ventricular dilatation consistent with central atrophy. Low-attenuation changes throughout the deep white matter consistent with small vessel ischemia. Old lacunar infarcts in the right caudate lobe, left thalamus, and left cerebellum. No change since previous study. No mass effect or midline shift. No abnormal extra-axial fluid collections. Gray-white matter junctions are distinct. Basal cisterns are not effaced. No evidence of acute intracranial hemorrhage. No depressed skull fractures. Small retention cyst in the sphenoid sinus. Mastoid air cells are not opacified.  IMPRESSION: No acute intracranial abnormalities. Chronic atrophy and small vessel ischemic changes with multiple old lacunar infarcts.  These results were called by telephone at the time of interpretation on 08/06/2014 at 10:52 pm to Dr. Vanetta MuldersSCOTT ZACKOWSKI , who verbally acknowledged these results.   Electronically Signed   By: Burman NievesWilliam  Stevens M.D.   On: 08/06/2014 22:54   Ct Head Wo Contrast  08/04/2014   CLINICAL DATA:  Facial droop and left upper extremity paresis.  EXAM: CT HEAD WITHOUT CONTRAST  TECHNIQUE: Contiguous axial images were obtained from the base of the skull through the vertex without intravenous contrast.  COMPARISON:  CT scan of July 24, 2014.  FINDINGS: Bony calvarium appears intact. Mild diffuse cortical atrophy is noted. Moderate chronic ischemic white matter disease is noted. Stable calcifications are noted in the basal ganglia bilaterally. No mass effect or midline shift is noted. Ventricular size is within normal limits. There is no evidence of mass lesion, hemorrhage or acute infarction.  IMPRESSION: Mild diffuse cortical atrophy. Moderate chronic ischemic white matter disease. No acute  intracranial abnormality seen.   Electronically Signed   By: Roque LiasJames  Green M.D.   On: 08/04/2014 14:02   Ct Head Wo Contrast  07/24/2014   CLINICAL DATA:  Elevated blood glucose, hypertensive, mental status change  EXAM: CT HEAD WITHOUT CONTRAST  TECHNIQUE: Contiguous axial images were obtained from the base of the skull through the vertex without intravenous contrast.  COMPARISON:  06/12/2014  FINDINGS: There is no evidence of mass effect, midline shift, or extra-axial fluid collections. There is no evidence of a space-occupying lesion or intracranial hemorrhage. There is no evidence of a cortical-based area of acute infarction. There is an old right basal ganglia and bilateral thalamic lacunar infarct. There is generalized cerebral atrophy. There is periventricular white matter low attenuation likely secondary to microangiopathy.  The ventricles and sulci are appropriate for the patient's age. The basal cisterns are patent.  Visualized portions of the orbits are unremarkable. The visualized portions of the paranasal sinuses and mastoid air cells are unremarkable. Cerebrovascular atherosclerotic calcifications are noted.  The osseous structures are unremarkable.  IMPRESSION: No acute intracranial pathology.   Electronically Signed   By: Elige KoHetal  Patel   On: 07/24/2014 11:06   Mr Maxine GlennMra Head Wo Contrast  08/08/2014   CLINICAL DATA:  Sudden onset of inability to speak beginning yesterday. Symptoms  have now resolved. Stroke risk factors include previous stroke, diabetes, hypertension, hyperlipidemia, and chronic renal disease.  EXAM: MRI HEAD WITHOUT CONTRAST  MRA HEAD WITHOUT CONTRAST  TECHNIQUE: Multiplanar, multiecho pulse sequences of the brain and surrounding structures were obtained without intravenous contrast. Angiographic images of the head were obtained using MRA technique without contrast.  COMPARISON:  CT head 08/06/2014.  MR head 04/24/14  FINDINGS: MRI HEAD FINDINGS  Subcentimeter acute infarct in the  LEFT corpus callosum just lateral and superior to the genu. This lies within the LEFT ACA territory. No other areas of acute infarction are identified. No hemorrhage, mass lesion, or extra-axial fluid.  Hydrocephalus ex vacuo. Extensive small vessel disease throughout the periventricular and subcortical white matter. Widespread remote basal ganglia, thalamic, and deep white matter infarcts some of which were acute on the previous study from June. Partial empty sella. Cervical spondylosis. No osseous lesions. BILATERAL cataract extraction. Chronic sinus and mastoid fluid.  MRA HEAD FINDINGS  Misregistration of the process images precludes accurate stenosis evaluation. There is gross patency of the internal carotid arteries and basilar artery with RIGHT vertebral dominant. Suspected moderate RIGHT cavernous ICA disease and mild LEFT cavernous ICA disease suspected mild disease of the distal LEFT vertebral. There is no proximal flow reducing lesion of the anterior, middle, or posterior cerebral arteries. Both distal anterior cerebral arteries appear patent.  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  Advanced atrophy, small vessel disease, and widespread remote deep nuclei and deep white matter infarcts.  Patient motion results in misregistration of the MRA exam. Gross patency of the intracranial vasculature, including both anterior cerebral arteries, is established however.   Electronically Signed   By: Davonna Belling M.D.   On: 08/08/2014 09:54   Mr Brain Wo Contrast  08/08/2014   CLINICAL DATA:  Sudden onset of inability to speak beginning yesterday. Symptoms have now resolved. Stroke risk factors include previous stroke, diabetes, hypertension, hyperlipidemia, and chronic renal disease.  EXAM: MRI HEAD WITHOUT CONTRAST  MRA HEAD WITHOUT CONTRAST  TECHNIQUE: Multiplanar, multiecho pulse sequences of the brain and surrounding structures were obtained without intravenous  contrast. Angiographic images of the head were obtained using MRA technique without contrast.  COMPARISON:  CT head 08/06/2014.  MR head 04/24/14  FINDINGS: MRI HEAD FINDINGS  Subcentimeter acute infarct in the LEFT corpus callosum just lateral and superior to the genu. This lies within the LEFT ACA territory. No other areas of acute infarction are identified. No hemorrhage, mass lesion, or extra-axial fluid.  Hydrocephalus ex vacuo. Extensive small vessel disease throughout the periventricular and subcortical white matter. Widespread remote basal ganglia, thalamic, and deep white matter infarcts some of which were acute on the previous study from June. Partial empty sella. Cervical spondylosis. No osseous lesions. BILATERAL cataract extraction. Chronic sinus and mastoid fluid.  MRA HEAD FINDINGS  Misregistration of the process images precludes accurate stenosis evaluation. There is gross patency of the internal carotid arteries and basilar artery with RIGHT vertebral dominant. Suspected moderate RIGHT cavernous ICA disease and mild LEFT cavernous ICA disease suspected mild disease of the distal LEFT vertebral. There is no proximal flow reducing lesion of the anterior, middle, or posterior cerebral arteries. Both distal anterior cerebral arteries appear patent.  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  Advanced atrophy, small vessel disease, and widespread remote deep nuclei and deep white matter infarcts.  Patient motion results in misregistration of the MRA exam.  Gross patency of the intracranial vasculature, including both anterior cerebral arteries, is established however.   Electronically Signed   By: Davonna Belling M.D.   On: 08/08/2014 09:54   Dg Chest Port 1 View  08/07/2014   CLINICAL DATA:  Short of breath. Labored in shallow breathing. Unresponsive.  EXAM: PORTABLE CHEST - 1 VIEW  COMPARISON:  07/29/2014  FINDINGS: Probable linear atelectasis in the left mid  lung. Normal heart size and pulmonary vascularity. No focal airspace disease in the lungs. No blunting of costophrenic angles. No pneumothorax. Mediastinal contours appear intact.  IMPRESSION: No active disease.   Electronically Signed   By: Burman Nieves M.D.   On: 08/07/2014 00:02   Dg Chest Port 1 View  07/29/2014   CLINICAL DATA:  Intubation.  EXAM: PORTABLE CHEST - 1 VIEW  COMPARISON:  07/28/2014.  FINDINGS: Endotracheal tube 1.9 cm above the carina. Left IJ line in NG tube in stable position. Cardiac monitor stable position. Heart size is stable. Pulmonary vascularity is normal. Bibasilar atelectasis and/or infiltrates are present. Small bilateral pleural effusions cannot be excluded. A mild component congestive heart failure cannot be excluded. No pneumothorax. No acute osseus abnormality.  IMPRESSION: 1. Interval reposition of endotracheal tube. Its tip is now 1.9 cm above the carina left IJ line, and NG tube in stable position. 2. Interval development of bibasilar infiltrates and possible tiny pleural effusions. Findings suggest mild CHF. Bibasilar pneumonia cannot be excluded.   Electronically Signed   By: Maisie Fus  Register   On: 07/29/2014 07:45   Dg Chest Portable 1 View  07/28/2014   CLINICAL DATA:  Endotracheal tube and orogastric tube placement  EXAM: PORTABLE CHEST - 1 VIEW  COMPARISON:  07/27/2014  FINDINGS: Endotracheal tube is seen with tip directly at the carina directed toward left main bronchus. Left internal jugular central line is seen with tip over the junction of the brachiocephalic vein and superior vena cava. There is no pneumothorax. NG tube crosses the gastroesophageal junction.  Right lung is clear. Partial consolidation left lower lobe most consistent with partial atelectasis, improved when compared to the prior study. When compared to prior study there is improved aeration in the left lower lobe.  IMPRESSION: Endotracheal tube at the carina. Consider retracting the tube 2-3  cm and repeating the image. Continued atelectasis left lower lobe, but improved when compared to prior study. These results were called by telephone at the time of interpretation on 07/28/2014 at 2:23 am to Joy, the patient's charge nurse, who verbally acknowledged these results.   Electronically Signed   By: Esperanza Heir M.D.   On: 07/28/2014 02:23   Dg Chest Port 1 View  07/27/2014   CLINICAL DATA:  Pneumonitis.  EXAM: PORTABLE CHEST - 1 VIEW  COMPARISON:  07/26/2014.  FINDINGS: Endotracheal tube ends between the clavicular heads and carina. Left IJ catheter is likely stable in positioning, although there is new distortion from leftward rotation. Orogastric tube enters the stomach at least.  There is no change in heart size and mediastinal contours when accounting for rotation. Implantable cardiac monitor has a stable orientation.  Worsening basilar lung aeration. Hazy appearance favors atelectasis and possibly small pleural effusion. There is no edema or pneumothorax.  IMPRESSION: 1. Stable positioning of tubes and central line. 2. Significant improvement and left upper lobe aeration. Rapid clearing favors pneumonitis or atelectasis over pneumonia. 3. New bibasilar atelectasis.   Electronically Signed   By: Tiburcio Pea M.D.   On: 07/27/2014 06:25  Dg Chest Port 1 View  07/26/2014   CLINICAL DATA:  Ventilator dependent respiratory failure. Followup left upper lobe pneumonia.  EXAM: PORTABLE CHEST - 1 VIEW  COMPARISON:  Portable chest x-rays yesterday, 07/24/2014, 06/13/2014.  FINDINGS: Endotracheal tube tip in satisfactory position projecting approximately 4-5 cm above the carina. Nasogastric tube courses below the diaphragm into the stomach. Left jugular sent venous catheter tip projects over the upper SVC, unchanged.  Cardiac silhouette upper normal in size to slightly enlarged but stable. Airspace consolidation in the left upper lobe, slightly worse than on yesterday's examination. No new pulmonary  parenchymal abnormalities.  IMPRESSION: Support apparatus satisfactory. Slight worsening of left upper lobe pneumonia since yesterday. No new abnormalities.   Electronically Signed   By: Hulan Saas M.D.   On: 07/26/2014 09:10   Portable Chest Xray In Am  07/25/2014   CLINICAL DATA:  Follow-up endotracheal tube position.  EXAM: PORTABLE CHEST - 1 VIEW  COMPARISON:  07/24/2014  FINDINGS: Endotracheal tube remains in place with tip approximately 2-2.5 cm above the carina. Left jugular central venous catheter is unchanged with tip near the brachiocephalic vein confluence. Enteric tube courses into the left upper abdomen. Loop recorder is again seen. Cardiac silhouette is within normal limits for size. Left perihilar opacity has decreased from the prior study. Right lung remains clear. No definite pleural effusion or pneumothorax is identified.  IMPRESSION: 1. Endotracheal tube 2-2.5 cm above the carina. 2. Improved left upper lobe aeration.   Electronically Signed   By: Sebastian Ache   On: 07/25/2014 09:13   Portable Chest Xray  07/24/2014   CLINICAL DATA:  Interval intubation of the patient ; assess support tube and line. Placement  EXAM: PORTABLE CHEST - 1 VIEW  COMPARISON:  Portable chest x-ray of today's date.  FINDINGS: The endotracheal tube tip lies 2 cm above the crotch of the carina. The right internal jugular venous catheter tip lies at the level of the junction of the right and left brachiocephalic veins. The esophagogastric tube tip projects below the inferior margin of the image. A loop recorder is visible over the left atrial region. The cardiac silhouette is normal in size. The pulmonary vascularity is not engorged. There is confluent alveolar opacity in the left perihilar region that is more conspicuous than on the earlier study. The bony thorax exhibits no acute abnormality.  IMPRESSION: 1. The endotracheal tube tip lies 2.1 cm above the crotch of the carina. Withdrawal by 2-3 cm is  recommended to avoid accidental mainstem bronchus intubation. 2. The left internal jugular venous catheter and the esophagogastric tube are in appropriate position radiographically. There is no postprocedure pneumothorax or hemothorax. 3. There is increased alveolar opacity in the low left perihilar region consistent with pneumonia. 4. These results were called by telephone at the time of interpretation on 07/24/2014 at 7:21 pm to [facility], RN, who verbally acknowledged these results.   Electronically Signed   By: David  [place]   On: 07/24/2014 19:26   Dg Chest Port 1 View  07/24/2014   CLINICAL DATA:  Recent aspiration  EXAM: PORTABLE CHEST - 1 VIEW  COMPARISON:  06/13/2014  FINDINGS: The cardiac shadow is stable. Monitoring device is again seen. The right lung is clear. The left lung demonstrates patchy infiltrate in the upper lobe which may be related to the patient's recent aspiration.  IMPRESSION: Left upper lobe infiltrate. Followup films to resolution are recommended.   Electronically Signed   By: Eulah Pont.D.  On: 07/24/2014 11:09   Dg Abd Portable 1v  07/30/2014   CLINICAL DATA:  Feeding tube placement  EXAM: PORTABLE ABDOMEN - 1 VIEW  COMPARISON:  Portable exam 1642 hr compared to 07/28/2014  FINDINGS: Feeding tube coiled at mid stomach with tip reflected to fundus.  Nonobstructive bowel gas pattern.  Minimal atelectasis and questionable fusions at lung bases.  Loop recorder projects over chest.  IMPRESSION: Feeding tube coiled at mid to proximal stomach.   Electronically Signed   By: Ulyses Southward M.D.   On: 07/30/2014 16:55   Dg Abd Portable 1v  07/28/2014   CLINICAL DATA:  Endotracheal tube and orogastric tube placement  EXAM: PORTABLE ABDOMEN - 1 VIEW  COMPARISON:  None.  FINDINGS: Orogastric tube crosses the gastroesophageal junction with tip in the left upper quadrant in the anticipated position of stomach. Endotracheal tube is also seen on this study with tip about 12 mm above the  carinal.  IMPRESSION: Endotracheal tube and OG tube as described.   Electronically Signed   By: Esperanza Heir M.D.   On: 07/28/2014 02:24   Dg Swallowing Func-speech Pathology  08/01/2014   Riley Nearing Deblois, CCC-SLP     08/01/2014  1:43 PM Objective Swallowing Evaluation: Modified Barium Swallowing Study   Patient Details  Name: ETOLA MULL MRN: [id number] Date of Birth: 10/25/1941  Today's Date: 08/01/2014 Time: 1300-1330 SLP Time Calculation (min): 30 min  Past Medical History:  Past Medical History  Diagnosis Date  . Hypertension   . Diabetes mellitus   . Chronic renal insufficiency   . Hyperlipidemia   . Osteopenia   . CVA (cerebral infarction)   . Thyroid disease    Past Surgical History:  Past Surgical History  Procedure Laterality Date  . Eye surgery    . Loop recorder implant  03-24-2014    MDT LinQ implanted by Dr Johney Frame for cryptogenic stroke  . Tee without cardioversion N/A 03/23/2014    Procedure: TRANSESOPHAGEAL ECHOCARDIOGRAM (TEE);  Surgeon:  Lars Masson, MD;  Location: [facility] ENDOSCOPY;  Service:  Cardiovascular;  Laterality: N/A;   HPI:  [name] with COPD, CVA, CK D., Diabetes admitted 9/18  with acute respiratory distress , hypercarbic respiratory  failure, DKA with glucose of 802 and anion gap of 19 , requiring  mechanical ventilation. Chest x-ray showed left upper lobe  infiltrate. Was placed on BiPAP initially, but unresponsive on  arrival to the ICU, hence intubated. She had recent admit from  8/6 to 814 for hypercarbic failure that improved with BiPAP-dc'd  to NH , then home since 9/14. 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 Patient has been seen by SLP  during previous admissions and has been observed with a delayed  oral phase and suspected delayed swallow initiation with s/s of  aspiration with thin liquids via straw. Most frequent diet  recommendations for dysphagia 1 solids, thin liquids.      Assessment / Plan / Recommendation Clinical Impression   Dysphagia Diagnosis: Moderate oral phase dysphagia;Mild  pharyngeal phase dysphagia Clinical impression: Pt presents with a primary cognitive based,  oral dyspahgia with lingual pumping and piecemeal transit of PO,  leading to multiple swallows per bolus. Swallow response is only  slightly delayed but mild base of tongue weakness results in  trace vallecular residuals and very trace penetration after the  swallow with nectar thick liquids. Pt senses penetrate and expels  with a soft throat clear. Given overt aspiration of thin  at  bedside and pts AMS, thin liquids not tested though expect pt  will be able to upgrade as mentation improves. Pt to initiate dys  1 (puree) diet and nectar thick liquids. SLP will follow for  tolerance.      Treatment Recommendation  Therapy as outlined in treatment plan below    Diet Recommendation Dysphagia 1 (Puree);Nectar-thick liquid   Liquid Administration via: Cup;Straw Medication Administration: Whole meds with puree Supervision: Staff to assist with self feeding Compensations: Slow rate;Small sips/bites Postural Changes and/or Swallow Maneuvers: Seated upright 90  degrees;Upright 30-60 min after meal    Other  Recommendations Oral Care Recommendations: Oral care BID Other Recommendations: Order thickener from pharmacy   Follow Up Recommendations  Skilled Nursing facility    Frequency and Duration min 2x/week  2 weeks   Pertinent Vitals/Pain NA    SLP Swallow Goals     General HPI: [name] with COPD, CVA, CK D., Diabetes  admitted 9/18 with acute respiratory distress , hypercarbic  respiratory failure, DKA with glucose of 802 and anion gap of 19  , requiring mechanical ventilation. Chest x-ray showed left upper  lobe infiltrate. Was placed on BiPAP initially, but unresponsive  on arrival to the ICU, hence intubated. She had recent admit from  8/6 to 814 for hypercarbic failure that improved with BiPAP-dc'd  to NH , then home since 9/14. H/o of CVA in June 2015 with   residual left-sided hemiparesis. Patient has been seen by SLP  during previous admissions and has been observed with a delayed  oral phase and suspected delayed swallow initiation with s/s of  aspiration with thin liquids via straw. Most frequent diet  recommendations for dysphagia 1 solids, thin liquids.  Type of Study: Modified Barium Swallowing Study Reason for Referral: Objectively evaluate swallowing function Previous Swallow Assessment: see HPI Diet Prior to this Study: NPO Temperature Spikes Noted: No Respiratory Status: Room air History of Recent Intubation: Yes Length of Intubations (days): 2 days Date extubated: 07/29/14 Behavior/Cognition: Alert;Cooperative;Requires cueing Oral Cavity - Dentition: Edentulous Oral Motor / Sensory Function: Within functional limits Self-Feeding Abilities: Total assist Patient Positioning: Upright in chair Baseline Vocal Quality: Clear Volitional Cough: Weak Volitional Swallow: Able to elicit Pharyngeal Secretions: Not observed secondary MBS    Reason for Referral Objectively evaluate swallowing function   Oral Phase Oral Preparation/Oral Phase Oral Phase: Impaired Oral - Nectar Oral - Nectar Cup: Lingual pumping;Reduced posterior  propulsion;Piecemeal swallowing;Delayed oral transit Oral - Nectar Straw: Lingual pumping;Reduced posterior  propulsion;Piecemeal swallowing;Delayed oral transit Oral - Solids Oral - Puree: Lingual pumping;Reduced posterior  propulsion;Piecemeal swallowing;Delayed oral transit   Pharyngeal Phase Pharyngeal Phase Pharyngeal Phase: Impaired Pharyngeal - Nectar Pharyngeal - Nectar Cup: Reduced tongue base  retraction;Penetration/Aspiration after swallow;Pharyngeal  residue - valleculae Penetration/Aspiration details (nectar cup): Material enters  airway, CONTACTS cords then ejected out;Material does not enter  airway Pharyngeal - Nectar Straw: Reduced tongue base  retraction;Penetration/Aspiration after swallow;Pharyngeal  residue - valleculae  Penetration/Aspiration details (nectar straw): Material does not  enter airway;Material enters airway, CONTACTS cords then ejected  out Pharyngeal - Solids Pharyngeal - Puree: Reduced tongue base retraction;Pharyngeal  residue - valleculae Penetration/Aspiration details (puree): Material does not enter  airway  Cervical Esophageal Phase    GO    Cervical Esophageal Phase Cervical Esophageal Phase: Impaired Cervical Esophageal Phase - Comment Cervical Esophageal Comment: Appearance of slow transit, stasis  through GE junction        [employer], MA CCC-SLP [phone]  [place], Kendal Hymen  [place] Citizen 08/01/2014, 1:42 PM     Medications: Scheduled Meds: . antiseptic oral rinse  7 mL Mouth Rinse q12n4p  . atorvastatin  40 mg Oral Daily  . budesonide-formoterol  2 puff Inhalation BID  . chlorhexidine  15 mL Mouth Rinse BID  . cloNIDine  0.3 mg Transdermal Weekly  . feeding supplement (PRO-STAT SUGAR FREE 64)  30 mL Oral BID  . heparin  5,000 Units Subcutaneous 3 times per day  . hydrALAZINE  20 mg Intravenous Q8H  . insulin aspart  0-9 Units Subcutaneous 6 times per day  . insulin glargine  8 Units Subcutaneous Daily  . isosorbide dinitrate  10 mg Oral TID  . levothyroxine  25 mcg Intravenous Daily      LOS: 10 days   Atha Mcbain M.D. [facility] 08/16/2014, 9:41 AM Pager: [phone]  If 7PM-7AM, please contact night-coverage [email or web address] Password [email or web address]

## 2014-08-17 LAB — GLUCOSE, CAPILLARY
GLUCOSE-CAPILLARY: 158 mg/dL — AB (ref 70–99)
GLUCOSE-CAPILLARY: 235 mg/dL — AB (ref 70–99)
Glucose-Capillary: 118 mg/dL — ABNORMAL HIGH (ref 70–99)
Glucose-Capillary: 129 mg/dL — ABNORMAL HIGH (ref 70–99)
Glucose-Capillary: 171 mg/dL — ABNORMAL HIGH (ref 70–99)
Glucose-Capillary: 246 mg/dL — ABNORMAL HIGH (ref 70–99)
Glucose-Capillary: 262 mg/dL — ABNORMAL HIGH (ref 70–99)

## 2014-08-17 MED ORDER — INSULIN GLARGINE 100 UNIT/ML ~~LOC~~ SOLN
12.0000 [IU] | Freq: Every day | SUBCUTANEOUS | Status: DC
  Administered 2014-08-18 – 2014-08-20 (×3): 12 [IU] via SUBCUTANEOUS
  Filled 2014-08-17 (×3): qty 0.12

## 2014-08-17 NOTE — Progress Notes (Signed)
UR complete.  Deziya Amero RN, MSN 

## 2014-08-17 NOTE — Progress Notes (Signed)
Physical Therapy Treatment Patient Details Name: [name] [name] MRN: [id number] DOB: 1941-07-02 Today's Date: 08/17/2014    History of Present Illness 73 yo [facility] due to AMS and expressive aphasia. BP on arrival 212/75. 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 PMHx includes recent admission (07/2014) with respiratory failure, DM, and CVA.     PT Comments    Pt with significantly more pushing today compared to last session, unable to stand as well assisted and was not strong or safe enough to transfer OOB to the recliner chair today.  Pt continues to be appropriate for SNF level therapy at discharge and PT will continue to follow acutely.    Follow Up Recommendations  SNF     Equipment Recommendations  None recommended by PT    Recommendations for Other Services   NA     Precautions / Restrictions Precautions Precautions: Fall Precaution Comments: left sided weakness, pusher with very strong grasp bil (R>L)    Mobility  Bed Mobility Overal bed mobility: Needs Assistance;+2 for physical assistance Bed Mobility: Supine to Sit;Sit to Supine Rolling: +2 for physical assistance;Total assist   Supine to sit: +2 for physical assistance;Total assist Sit to supine: +2 for physical assistance;Total assist   General bed mobility comments: Two person physical assist to support trunk and move legs EOB.  Pt given extra time and tactile cues to try to initiate move EOB and she was unable. Two person assist to assist with trunk and legs to get back to supine. Pt clutching bed rail very hard and grip had to be released in order to get her back into the bed.   Transfers Overall transfer level: Needs assistance Equipment used: 2 person hand held assist Transfers: Sit to/from Stand Sit to Stand: +2 physical assistance;Total assist Stand pivot transfers: +2 physical assistance;Total assist Squat pivot transfers:  (unable to today)     General transfer  comment: Two person total assit to attempt standing EOB (second attempt not any better than first attempt.  Bil legs blocked and pt's trunk supported at the hips first time with hands second time with bed pad.  Pt flexed over and if given the opportunity will push posteriorly in resistance with bil hands.       Modified Rankin (Stroke Patients Only) Modified Rankin (Stroke Patients Only) Pre-Morbid Rankin Score: Moderately severe disability Modified Rankin: Severe disability     Balance Overall balance assessment: Needs assistance Sitting-balance support: Bilateral upper extremity supported;Single extremity supported;No upper extremity supported;Feet supported Sitting balance-Leahy Scale: Zero Sitting balance - Comments: unable to sit EOB without R posterior lean.  Pt pushing hard with her right arm in particular, so worked on leaning far onto her left elbow and staying there for a few minutes and that would help decrease her right pushing/lean until we went to move or she got scared.  Pt wanting to grasp to bed rail or even therapist's arms with difficulty getting her to release her grasp.  Significant right gaze preference in sitting and in bed, unable to even try to range her head to get to neutral, pt resisting movement.  Postural control: Right lateral lean;Posterior lean Standing balance support: Bilateral upper extremity supported;No upper extremity supported Standing balance-Leahy Scale: Zero Standing balance comment: Unable to stand without two person total assit.                     Cognition Arousal/Alertness: Awake/alert Behavior During Therapy: WFL for tasks assessed/performed  Overall Cognitive Status: History of cognitive impairments - at baseline Area of Impairment: Orientation;Attention;Memory;Following commands;Safety/judgement;Awareness;Problem solving Orientation Level: Disoriented to;Person;Place;Time;Situation Current Attention Level: Focused Memory: Decreased  short-term memory Following Commands: Follows one step commands inconsistently Safety/Judgement: Decreased awareness of safety;Decreased awareness of deficits Awareness: Intellectual (if that) Problem Solving: Slow processing;Decreased initiation;Requires verbal cues;Difficulty sequencing;Requires tactile cues General Comments: Pt not speaking much during my session, decreased attention, significant awareness issues and more difficulty mobilizing today.        General Comments General comments (skin integrity, edema, etc.): Mitten restraints re applied at end of session and therapist positioned her on her left side for comfort and pressure relief.       Pertinent Vitals/Pain Pain Assessment: No/denies pain           PT Goals (current goals can now be found in the care plan section) Acute Rehab PT Goals Patient Stated Goal: none stated Progress towards PT goals: Not progressing toward goals - comment (pt with significantly more pushing today than last session)    Frequency  Min 2X/week (due to from SNF with significant premorbid impairments)    PT Plan Current plan remains appropriate       End of Session Equipment Utilized During Treatment: Gait belt Activity Tolerance: Other (comment) (limited by what seemed to be fear of falling) Patient left: in bed;with call bell/phone within reach;with bed alarm set;with nursing/sitter in room (RN tech in room)     Time: [id number] PT Time Calculation (min): 28 min  Charges:  $Therapeutic Activity: 8-22 mins $Neuromuscular Re-education: 8-22 mins                        Aulden Calise B. Ifeanyichukwu Wickham, PT, DPT [phone]    08/17/2014, 4:25 PM

## 2014-08-17 NOTE — Progress Notes (Signed)
Patient has been evaluated. I agree with the above note.  Angelia MouldHaywood M. Derrell LollingIngram, M.D., [facility] General and Minimally invasive Surgery Breast and Colorectal Surgery Office:   [phone] Pager:   [phone]

## 2014-08-17 NOTE — Progress Notes (Addendum)
Speech Language Pathology Treatment: Dysphagia;Cognitive-Linquistic  Patient Details Name: [name] [name] MRN: [id number] DOB: 1941-03-11 Today's Date: 08/17/2014 Time: [id number] SLP Time Calculation (min): 22 min  Assessment / Plan / Recommendation Clinical Impression  Pt demonstrates significant progress with arousal, functional communication and swallowing. Pt easily awakened from sleep, making appropriate requests at phrase level with min question cues. SLP provided moderate verbal and tactile cues for swallowing, total assist for positioning and feeding, resulting in relatively good tolerance of trials of yogurt and nectar thick liquids via teaspoon. Timing of oral transit and swallow trigger mildly delayed but automatic. No signs of aspiration observed though risk is present. Pt did have multiple swallows prior to and after PO with complaint of pain with swallow. She reported that the food was good, but she didn't want any more due to throat pain. Pain likely related to panda tube which may be coiled in the pharynx - attempted to view with flashlight, but could not. Recommend pt attempt a diet to help family determine if they may wish to continue with plan for d/c to SNF with hospice with diet rather than PEG tube placement. Suggest Dys 1/honey thick liquids via teaspoon if family agrees to risk of aspiraiton with POs. Awaiting response from MD.    HPI HPI: [name] with COPD, CVA, CK D., Diabetes admitted from SNF after refusing her medication, was nonverbal, brought in as a code stroke.  [facility] to [facility] distress.  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     Pertinent Vitals    SLP Plan  Continue with current plan of care    Recommendations Diet recommendations: Dysphagia 1 (puree);Nectar-thick liquid Liquids provided via:  Teaspoon Medication Administration: Via alternative means Supervision: Staff to assist with self feeding;Full supervision/cueing for compensatory strategies Compensations: Slow rate;Small sips/bites              Oral Care Recommendations: Oral care Q4 per protocol Follow up Recommendations: Skilled Nursing facility Plan: Continue with current plan of care    GO    [name] Anthonys HospitalBonnie Takyia Sindt, MA CCC-SLP [phone]  Claudine [name], [name] [name]

## 2014-08-17 NOTE — Progress Notes (Signed)
Inpatient Diabetes Program Recommendations  AACE/ADA: New Consensus Statement on Inpatient Glycemic Control (2013)  Target Ranges:  Prepandial:   less than 140 mg/dL      Peak postprandial:   less than 180 mg/dL (1-2 hours)      Critically ill patients:  140 - 180 mg/dL   Inpatient Diabetes Program Recommendations Insulin - Basal: Increase Lantus to 15 units  Thank you  Irja Wheless BSN, RN,CDE Inpatient Diabetes Coordinator [phone] (team pager)    

## 2014-08-17 NOTE — Progress Notes (Signed)
Called by SLP today.  Repeat swallow today shows improvements.  A dysphagia 1 diet was recommended.  I have spoken to Dr. Isidoro Donningai who is going to initiate this.  We will hold off on a G-tube.   We will sign off.  [name] [name] E 9:44 AM 08/17/2014

## 2014-08-17 NOTE — Progress Notes (Signed)
Patient ID: [name] [name]  female  OZH:[id number]    DOB: 01-Jun-1941    DOA: 08/06/2014  PCP: Alva Garnet., MD  Brief narrative:  Patient is a 73 year old with prior CVAs, hypertension, DM, hypothyroidism who presented to the hospital secondary to inability to speak. Upon further evaluation was found to have a new stroke. The patient has failed bedside swallow evaluation and after discussion with POA, palliative meeting, requested PEG tube placement as part of their goals of care.   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 - Patient was on Plavix 75 mg daily prior to admission, will restart Aggrenox - Failed swallow evaluation initially, family requested a PEG tube, IR and GI unable to place G tube, surgery was planning to do open PEG tube however patient more alert today and speech therapy advanced diet to dysphagia 1 - Will attempt dysphagia 1 diet, if patient is tolerating diet, discontinue panda tube in the evening or in a.m.   - PT recommended skilled nursing facility  Active Problems:  Hypernatremia improving -Currently tolerating tube feeds however attempting to dysphagia 1 diet today, BMET any    DM type 2, uncontrolled, with renal complications - Continue CBGs every 4 hours, continue sliding scale insulin , inc Lantus to 8 units    CKD (chronic kidney disease), stage III - Currently stable    Hypothyroidism - Continue IV Synthroid    COPD (chronic obstructive pulmonary disease) - Currently stable   Dysphagia, pharyngoesophageal phase Advanced to dysphagia 1 diet   DVT Prophylaxis:  Code  Status:  Family Communication: No family member at the bedside  Disposition: Transition to skilled nursing facility in 24-48hrs if tolerating diet  Consultants:  Neurology  Palliative care  general surgery  GI   Antibiotics:  None    Subjective: Aphasia, nonverbal intermittently alert and awake  Objective: Weight change:  No intake or output data in the 24 hours ending 08/17/14 1042 Blood pressure 157/57, pulse 100, temperature 98.4 F (36.9 C), temperature source Axillary, resp. rate 18, height 5\' 3"  (1.6 m), weight 76 kg (167 lb 8.8 oz), SpO2 98.00%.  Physical Exam: General: Alert and awake NAD , aphasia CVS: S1-S2 clear, no murmur rubs or gallops Chest: CTAB Abdomen: soft NT, ND, NBS Extremities: no c/C/E Neuro: Does not cooperate with exam  Lab Results: Basic Metabolic Panel:  Recent Labs Lab 08/14/14 0500 08/15/14 0442  NA 151* 147  K 4.3 3.8  CL 113* 110  CO2 24 26  GLUCOSE 185* 229*  BUN 12 10  CREATININE 1.19* 1.04  CALCIUM 8.4 8.5   Liver Function Tests: No results found for this basename: AST, ALT, ALKPHOS, BILITOT, PROT, ALBUMIN,  in the last 168 hours No results found for this basename: LIPASE, AMYLASE,  in the last 168 hours No results found for this basename: AMMONIA,  in the last 168 hours CBC:  Recent Labs Lab 08/13/14 0830  WBC 4.6  HGB 9.8*  HCT 31.3*  MCV 92.1  PLT 265   Cardiac Enzymes: No results found for this basename: CKTOTAL, CKMB, CKMBINDEX, TROPONINI,  in the last 168 hours BNP: No components found with this basename: POCBNP,  CBG:  Recent Labs Lab 08/16/14 1645 08/16/14 2028 08/17/14 0004 08/17/14 0407 08/17/14 0806  GLUCAP 282* 243* 246* 262* 158*     Micro Results: No results found for this or any previous visit (from the past 240 hour(s)).  Studies/Results: Ct Head Wo Contrast  08/06/2014   CLINICAL DATA:  Code stroke.  Facial droop.  EXAM: CT HEAD WITHOUT CONTRAST  TECHNIQUE: Contiguous axial  images were obtained from the base of the skull through the vertex without intravenous contrast.  COMPARISON:  08/04/2014  FINDINGS: Diffuse cerebral atrophy. Ventricular dilatation consistent with central atrophy. Low-attenuation changes throughout the deep white matter consistent with small vessel ischemia. Old lacunar infarcts in the right caudate lobe, left thalamus, and left cerebellum. No change since previous study. No mass effect or midline shift. No abnormal extra-axial fluid collections. Gray-white matter junctions are distinct. Basal cisterns are not effaced. No evidence of acute intracranial hemorrhage. No depressed skull fractures. Small retention cyst in the sphenoid sinus. Mastoid air cells are not opacified.  IMPRESSION: No acute intracranial abnormalities. Chronic atrophy and small vessel ischemic changes with multiple old lacunar infarcts.  These results were called by telephone at the time of interpretation on 08/06/2014 at 10:52 pm to Dr. Vanetta Mulders , who verbally acknowledged these results.   Electronically Signed   By: Burman Nieves M.D.   On: 08/06/2014 22:54   Ct Head Wo Contrast  08/04/2014   CLINICAL DATA:  Facial droop and left upper extremity paresis.  EXAM: CT HEAD WITHOUT CONTRAST  TECHNIQUE: Contiguous axial images were obtained from the base of the skull through the vertex without intravenous contrast.  COMPARISON:  CT scan of July 24, 2014.  FINDINGS: Bony calvarium appears intact. Mild diffuse cortical atrophy is noted. Moderate chronic ischemic white matter disease is noted. Stable calcifications are noted in the basal ganglia bilaterally. No mass effect or midline shift is noted. Ventricular size is within normal limits. There is no evidence of mass lesion, hemorrhage or acute infarction.  IMPRESSION: Mild diffuse cortical atrophy. Moderate chronic ischemic white matter disease. No acute intracranial abnormality seen.   Electronically Signed   By: Roque Lias M.D.    On: 08/04/2014 14:02   Ct Head Wo Contrast  07/24/2014   CLINICAL DATA:  Elevated blood glucose, hypertensive, mental status change  EXAM: CT HEAD WITHOUT CONTRAST  TECHNIQUE: Contiguous axial images were obtained from the base of the skull through the vertex without intravenous contrast.  COMPARISON:  06/12/2014  FINDINGS: There is no evidence of mass effect, midline shift, or extra-axial fluid collections. There is no evidence of a space-occupying lesion or intracranial hemorrhage. There is no evidence of a cortical-based area of acute infarction. There is an old right basal ganglia and bilateral thalamic lacunar infarct. There is generalized cerebral atrophy. There is periventricular white matter low attenuation likely secondary to microangiopathy.  The ventricles and sulci are appropriate for the patient's age. The basal cisterns are patent.  Visualized portions of the orbits are unremarkable. The visualized portions of the paranasal sinuses and mastoid air cells are unremarkable. Cerebrovascular atherosclerotic calcifications are noted.  The osseous structures are unremarkable.  IMPRESSION: No acute intracranial pathology.   Electronically Signed   By: Elige Ko   On: 07/24/2014 11:06   Mr Maxine Glenn Head Wo Contrast  08/08/2014   CLINICAL DATA:  Sudden onset of inability to speak beginning yesterday. Symptoms have now resolved. Stroke risk factors include previous stroke, diabetes, hypertension, hyperlipidemia, and chronic renal disease.  EXAM: MRI HEAD WITHOUT CONTRAST  MRA HEAD WITHOUT CONTRAST  TECHNIQUE: Multiplanar, multiecho pulse sequences of the brain and surrounding structures were obtained without intravenous contrast. Angiographic images of the head were obtained using MRA technique without contrast.  COMPARISON:  CT head 08/06/2014.  MR head 04/24/14  FINDINGS: MRI HEAD FINDINGS  Subcentimeter acute infarct in the LEFT corpus callosum just lateral and superior to the genu. This lies within the  LEFT ACA territory. No other areas of acute infarction are identified. No hemorrhage, mass lesion, or extra-axial fluid.  Hydrocephalus ex vacuo. Extensive small vessel disease throughout the periventricular and subcortical white matter. Widespread remote basal ganglia, thalamic, and deep white matter infarcts some of which were acute on the previous study from June. Partial empty sella. Cervical spondylosis. No osseous lesions. BILATERAL cataract extraction. Chronic sinus and mastoid fluid.  MRA HEAD FINDINGS  Misregistration of the process images precludes accurate stenosis evaluation. There is gross patency of the internal carotid arteries and basilar artery with RIGHT vertebral dominant. Suspected moderate RIGHT cavernous ICA disease and mild LEFT cavernous ICA disease suspected mild disease of the distal LEFT vertebral. There is no proximal flow reducing lesion of the anterior, middle, or posterior cerebral arteries. Both distal anterior cerebral arteries appear patent.  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  Advanced atrophy, small vessel disease, and widespread remote deep nuclei and deep white matter infarcts.  Patient motion results in misregistration of the MRA exam. Gross patency of the intracranial vasculature, including both anterior cerebral arteries, is established however.   Electronically Signed   By: Davonna BellingJohn  Curnes M.D.   On: 08/08/2014 09:54   Mr Brain Wo Contrast  08/08/2014   CLINICAL DATA:  Sudden onset of inability to speak beginning yesterday. Symptoms have now resolved. Stroke risk factors include previous stroke, diabetes, hypertension, hyperlipidemia, and chronic renal disease.  EXAM: MRI HEAD WITHOUT CONTRAST  MRA HEAD WITHOUT CONTRAST  TECHNIQUE: Multiplanar, multiecho pulse sequences of the brain and surrounding structures were obtained without intravenous contrast. Angiographic images of the head were obtained using MRA technique without  contrast.  COMPARISON:  CT head 08/06/2014.  MR head 04/24/14  FINDINGS: MRI HEAD FINDINGS  Subcentimeter acute infarct in the LEFT corpus callosum just lateral and superior to the genu. This lies within the LEFT ACA territory. No other areas of acute infarction are identified. No hemorrhage, mass lesion, or extra-axial fluid.  Hydrocephalus ex vacuo. Extensive small vessel disease throughout the periventricular and subcortical white matter. Widespread remote basal ganglia, thalamic, and deep white matter infarcts some of which were acute on the previous study from June. Partial empty sella. Cervical spondylosis. No osseous lesions. BILATERAL cataract extraction. Chronic sinus and mastoid fluid.  MRA HEAD FINDINGS  Misregistration of the process images precludes accurate stenosis evaluation. There is gross patency of the internal carotid arteries and basilar artery with RIGHT vertebral dominant. Suspected moderate RIGHT cavernous ICA disease and mild LEFT cavernous ICA disease suspected mild disease of the distal LEFT vertebral. There is no proximal flow reducing lesion of the anterior, middle, or posterior cerebral arteries. Both distal anterior cerebral arteries appear patent.  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  Advanced atrophy, small vessel disease, and widespread remote deep nuclei and deep white matter infarcts.  Patient motion results in misregistration of the MRA exam. Gross patency of the intracranial vasculature, including both anterior cerebral arteries, is established however.   Electronically  Signed   By: Davonna Belling M.D.   On: 08/08/2014 09:54   Dg Chest Port 1 View  08/07/2014   CLINICAL DATA:  Short of breath. Labored in shallow breathing. Unresponsive.  EXAM: PORTABLE CHEST - 1 VIEW  COMPARISON:  07/29/2014  FINDINGS: Probable linear atelectasis in the left mid lung. Normal heart size and pulmonary vascularity. No focal airspace disease in the  lungs. No blunting of costophrenic angles. No pneumothorax. Mediastinal contours appear intact.  IMPRESSION: No active disease.   Electronically Signed   By: Burman Nieves M.D.   On: 08/07/2014 00:02   Dg Chest Port 1 View  07/29/2014   CLINICAL DATA:  Intubation.  EXAM: PORTABLE CHEST - 1 VIEW  COMPARISON:  07/28/2014.  FINDINGS: Endotracheal tube 1.9 cm above the carina. Left IJ line in NG tube in stable position. Cardiac monitor stable position. Heart size is stable. Pulmonary vascularity is normal. Bibasilar atelectasis and/or infiltrates are present. Small bilateral pleural effusions cannot be excluded. A mild component congestive heart failure cannot be excluded. No pneumothorax. No acute osseus abnormality.  IMPRESSION: 1. Interval reposition of endotracheal tube. Its tip is now 1.9 cm above the carina left IJ line, and NG tube in stable position. 2. Interval development of bibasilar infiltrates and possible tiny pleural effusions. Findings suggest mild CHF. Bibasilar pneumonia cannot be excluded.   Electronically Signed   By: Maisie Fus  Register   On: 07/29/2014 07:45   Dg Chest Portable 1 View  07/28/2014   CLINICAL DATA:  Endotracheal tube and orogastric tube placement  EXAM: PORTABLE CHEST - 1 VIEW  COMPARISON:  07/27/2014  FINDINGS: Endotracheal tube is seen with tip directly at the carina directed toward left main bronchus. Left internal jugular central line is seen with tip over the junction of the brachiocephalic vein and superior vena cava. There is no pneumothorax. NG tube crosses the gastroesophageal junction.  Right lung is clear. Partial consolidation left lower lobe most consistent with partial atelectasis, improved when compared to the prior study. When compared to prior study there is improved aeration in the left lower lobe.  IMPRESSION: Endotracheal tube at the carina. Consider retracting the tube 2-3 cm and repeating the image. Continued atelectasis left lower lobe, but improved when  compared to prior study. These results were called by telephone at the time of interpretation on 07/28/2014 at 2:23 am to Joy, the patient's charge nurse, who verbally acknowledged these results.   Electronically Signed   By: Esperanza Heir M.D.   On: 07/28/2014 02:23   Dg Chest Port 1 View  07/27/2014   CLINICAL DATA:  Pneumonitis.  EXAM: PORTABLE CHEST - 1 VIEW  COMPARISON:  07/26/2014.  FINDINGS: Endotracheal tube ends between the clavicular heads and carina. Left IJ catheter is likely stable in positioning, although there is new distortion from leftward rotation. Orogastric tube enters the stomach at least.  There is no change in heart size and mediastinal contours when accounting for rotation. Implantable cardiac monitor has a stable orientation.  Worsening basilar lung aeration. Hazy appearance favors atelectasis and possibly small pleural effusion. There is no edema or pneumothorax.  IMPRESSION: 1. Stable positioning of tubes and central line. 2. Significant improvement and left upper lobe aeration. Rapid clearing favors pneumonitis or atelectasis over pneumonia. 3. New bibasilar atelectasis.   Electronically Signed   By: Tiburcio Pea M.D.   On: 07/27/2014 06:25   Dg Chest Port 1 View  07/26/2014   CLINICAL DATA:  Ventilator dependent respiratory failure.  Followup left upper lobe pneumonia.  EXAM: PORTABLE CHEST - 1 VIEW  COMPARISON:  Portable chest x-rays yesterday, 07/24/2014, 06/13/2014.  FINDINGS: Endotracheal tube tip in satisfactory position projecting approximately 4-5 cm above the carina. Nasogastric tube courses below the diaphragm into the stomach. Left jugular sent venous catheter tip projects over the upper SVC, unchanged.  Cardiac silhouette upper normal in size to slightly enlarged but stable. Airspace consolidation in the left upper lobe, slightly worse than on yesterday's examination. No new pulmonary parenchymal abnormalities.  IMPRESSION: Support apparatus satisfactory. Slight  worsening of left upper lobe pneumonia since yesterday. No new abnormalities.   Electronically Signed   By: Hulan Saashomas  Lawrence M.D.   On: 07/26/2014 09:10   Portable Chest Xray In Am  07/25/2014   CLINICAL DATA:  Follow-up endotracheal tube position.  EXAM: PORTABLE CHEST - 1 VIEW  COMPARISON:  07/24/2014  FINDINGS: Endotracheal tube remains in place with tip approximately 2-2.5 cm above the carina. Left jugular central venous catheter is unchanged with tip near the brachiocephalic vein confluence. Enteric tube courses into the left upper abdomen. Loop recorder is again seen. Cardiac silhouette is within normal limits for size. Left perihilar opacity has decreased from the prior study. Right lung remains clear. No definite pleural effusion or pneumothorax is identified.  IMPRESSION: 1. Endotracheal tube 2-2.5 cm above the carina. 2. Improved left upper lobe aeration.   Electronically Signed   By: Sebastian AcheAllen  Grady   On: 07/25/2014 09:13   Portable Chest Xray  07/24/2014   CLINICAL DATA:  Interval intubation of the patient ; assess support tube and line. Placement  EXAM: PORTABLE CHEST - 1 VIEW  COMPARISON:  Portable chest x-ray of today's date.  FINDINGS: The endotracheal tube tip lies 2 cm above the crotch of the carina. The right internal jugular venous catheter tip lies at the level of the junction of the right and left brachiocephalic veins. The esophagogastric tube tip projects below the inferior margin of the image. A loop recorder is visible over the left atrial region. The cardiac silhouette is normal in size. The pulmonary vascularity is not engorged. There is confluent alveolar opacity in the left perihilar region that is more conspicuous than on the earlier study. The bony thorax exhibits no acute abnormality.  IMPRESSION: 1. The endotracheal tube tip lies 2.1 cm above the crotch of the carina. Withdrawal by 2-3 cm is recommended to avoid accidental mainstem bronchus intubation. 2. The left internal  jugular venous catheter and the esophagogastric tube are in appropriate position radiographically. There is no postprocedure pneumothorax or hemothorax. 3. There is increased alveolar opacity in the low left perihilar region consistent with pneumonia. 4. These results were called by telephone at the time of interpretation on 07/24/2014 at 7:21 pm to [facility] Woiods, RN, who verbally acknowledged these results.   Electronically Signed   By: David  Swazil[place]   On: 07/24/2014 19:26   Dg Chest Port 1 View  07/24/2014   CLINICAL DATA:  Recent aspiration  EXAM: PORTABLE CHEST - 1 VIEW  COMPARISON:  06/13/2014  FINDINGS: The cardiac shadow is stable. Monitoring device is again seen. The right lung is clear. The left lung demonstrates patchy infiltrate in the upper lobe which may be related to the patient's recent aspiration.  IMPRESSION: Left upper lobe infiltrate. Followup films to resolution are recommended.   Electronically Signed   By: Alcide CleverMark  Lukens M.D.   On: 07/24/2014 11:09   Dg Abd Portable 1v  07/30/2014   CLINICAL DATA:  Feeding tube placement  EXAM: PORTABLE ABDOMEN - 1 VIEW  COMPARISON:  Portable exam 1642 hr compared to 07/28/2014  FINDINGS: Feeding tube coiled at mid stomach with tip reflected to fundus.  Nonobstructive bowel gas pattern.  Minimal atelectasis and questionable fusions at lung bases.  Loop recorder projects over chest.  IMPRESSION: Feeding tube coiled at mid to proximal stomach.   Electronically Signed   By: Ulyses Southward M.D.   On: 07/30/2014 16:55   Dg Abd Portable 1v  07/28/2014   CLINICAL DATA:  Endotracheal tube and orogastric tube placement  EXAM: PORTABLE ABDOMEN - 1 VIEW  COMPARISON:  None.  FINDINGS: Orogastric tube crosses the gastroesophageal junction with tip in the left upper quadrant in the anticipated position of stomach. Endotracheal tube is also seen on this study with tip about 12 mm above the carinal.  IMPRESSION: Endotracheal tube and OG tube as described.   Electronically  Signed   By: Esperanza Heir M.D.   On: 07/28/2014 02:24   Dg Swallowing Func-speech Pathology  08/01/2014   Riley Nearing Deblois, CCC-SLP     08/01/2014  1:43 PM Objective Swallowing Evaluation: Modified Barium Swallowing Study   Patient Details  Name: ANELI ZARA MRN: [id number] Date of Birth: 09/19/1941  Today's Date: 08/01/2014 Time: 1300-1330 SLP Time Calculation (min): 30 min  Past Medical History:  Past Medical History  Diagnosis Date  . Hypertension   . Diabetes mellitus   . Chronic renal insufficiency   . Hyperlipidemia   . Osteopenia   . CVA (cerebral infarction)   . Thyroid disease    Past Surgical History:  Past Surgical History  Procedure Laterality Date  . Eye surgery    . Loop recorder implant  03-24-2014    MDT LinQ implanted by Dr Johney Frame for cryptogenic stroke  . Tee without cardioversion N/A 03/23/2014    Procedure: TRANSESOPHAGEAL ECHOCARDIOGRAM (TEE);  Surgeon:  Lars Masson, MD;  Location: [facility] ENDOSCOPY;  Service:  Cardiovascular;  Laterality: N/A;   HPI:  [name] with COPD, CVA, CK D., Diabetes admitted 9/18  with acute respiratory distress , hypercarbic respiratory  failure, DKA with glucose of 802 and anion gap of 19 , requiring  mechanical ventilation. Chest x-ray showed left upper lobe  infiltrate. Was placed on BiPAP initially, but unresponsive on  arrival to the ICU, hence intubated. She had recent admit from  8/6 to 814 for hypercarbic failure that improved with BiPAP-dc'd  to NH , then home since 9/14. 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 Patient has been seen by SLP  during previous admissions and has been observed with a delayed  oral phase and suspected delayed swallow initiation with s/s of  aspiration with thin liquids via straw. Most frequent diet  recommendations for dysphagia 1 solids, thin liquids.      Assessment / Plan / Recommendation Clinical Impression  Dysphagia Diagnosis: Moderate oral phase dysphagia;Mild  pharyngeal phase  dysphagia Clinical impression: Pt presents with a primary cognitive based,  oral dyspahgia with lingual pumping and piecemeal transit of PO,  leading to multiple swallows per bolus. Swallow response is only  slightly delayed but mild base of tongue weakness results in  trace vallecular residuals and very trace penetration after the  swallow with nectar thick liquids. Pt senses penetrate and expels  with a soft throat clear. Given overt aspiration of thin at  bedside and pts AMS, thin liquids not tested though expect pt  will be  able to upgrade as mentation improves. Pt to initiate dys  1 (puree) diet and nectar thick liquids. SLP will follow for  tolerance.      Treatment Recommendation  Therapy as outlined in treatment plan below    Diet Recommendation Dysphagia 1 (Puree);Nectar-thick liquid   Liquid Administration via: Cup;Straw Medication Administration: Whole meds with puree Supervision: Staff to assist with self feeding Compensations: Slow rate;Small sips/bites Postural Changes and/or Swallow Maneuvers: Seated upright 90  degrees;Upright 30-60 min after meal    Other  Recommendations Oral Care Recommendations: Oral care BID Other Recommendations: Order thickener from pharmacy   Follow Up Recommendations  Skilled Nursing facility    Frequency and Duration min 2x/week  2 weeks   Pertinent Vitals/Pain NA    SLP Swallow Goals     General HPI: [name] with COPD, CVA, CK D., Diabetes  admitted 9/18 with acute respiratory distress , hypercarbic  respiratory failure, DKA with glucose of 802 and anion gap of 19  , requiring mechanical ventilation. Chest x-ray showed left upper  lobe infiltrate. Was placed on BiPAP initially, but unresponsive  on arrival to the ICU, hence intubated. She had recent admit from  8/6 to 814 for hypercarbic failure that improved with BiPAP-dc'd  to NH , then home since 9/14. 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 Patient has been seen by SLP  during previous  admissions and has been observed with a delayed  oral phase and suspected delayed swallow initiation with s/s of  aspiration with thin liquids via straw. Most frequent diet  recommendations for dysphagia 1 solids, thin liquids.  Type of Study: Modified Barium Swallowing Study Reason for Referral: Objectively evaluate swallowing function Previous Swallow Assessment: see HPI Diet Prior to this Study: NPO Temperature Spikes Noted: No Respiratory Status: Room air History of Recent Intubation: Yes Length of Intubations (days): 2 days Date extubated: 07/29/14 Behavior/Cognition: Alert;Cooperative;Requires cueing Oral Cavity - Dentition: Edentulous Oral Motor / Sensory Function: Within functional limits Self-Feeding Abilities: Total assist Patient Positioning: Upright in chair Baseline Vocal Quality: Clear Volitional Cough: Weak Volitional Swallow: Able to elicit Pharyngeal Secretions: Not observed secondary MBS    Reason for Referral Objectively evaluate swallowing function   Oral Phase Oral Preparation/Oral Phase Oral Phase: Impaired Oral - Nectar Oral - Nectar Cup: Lingual pumping;Reduced posterior  propulsion;Piecemeal swallowing;Delayed oral transit Oral - Nectar Straw: Lingual pumping;Reduced posterior  propulsion;Piecemeal swallowing;Delayed oral transit Oral - Solids Oral - Puree: Lingual pumping;Reduced posterior  propulsion;Piecemeal swallowing;Delayed oral transit   Pharyngeal Phase Pharyngeal Phase Pharyngeal Phase: Impaired Pharyngeal - Nectar Pharyngeal - Nectar Cup: Reduced tongue base  retraction;Penetration/Aspiration after swallow;Pharyngeal  residue - valleculae Penetration/Aspiration details (nectar cup): Material enters  airway, CONTACTS cords then ejected out;Material does not enter  airway Pharyngeal - Nectar Straw: Reduced tongue base  retraction;Penetration/Aspiration after swallow;Pharyngeal  residue - valleculae Penetration/Aspiration details (nectar straw): Material does not  enter  airway;Material enters airway, CONTACTS cords then ejected  out Pharyngeal - Solids Pharyngeal - Puree: Reduced tongue base retraction;Pharyngeal  residue - valleculae Penetration/Aspiration details (puree): Material does not enter  airway  Cervical Esophageal Phase    GO    Cervical Esophageal Phase Cervical Esophageal Phase: Impaired Cervical Esophageal Phase - Comment Cervical Esophageal Comment: Appearance of slow transit, stasis  through GE junction        [facility], MA CCC-SLP [phone]  DeBlois, Riley Nearing 08/01/2014, 1:42 PM     Medications: Scheduled Meds: . antiseptic oral rinse  7 mL Mouth Rinse q12n4p  . atorvastatin  40 mg Oral Daily  . budesonide-formoterol  2 puff Inhalation BID  . chlorhexidine  15 mL Mouth Rinse BID  . cloNIDine  0.3 mg Transdermal Weekly  . feeding supplement (PRO-STAT SUGAR FREE 64)  30 mL Oral BID  . heparin  5,000 Units Subcutaneous 3 times per day  . hydrALAZINE  20 mg Intravenous Q8H  . insulin aspart  0-9 Units Subcutaneous 6 times per day  . insulin glargine  8 Units Subcutaneous Daily  . isosorbide dinitrate  10 mg Oral TID  . levothyroxine  25 mcg Intravenous Daily      LOS: 11 days   RAI,RIPUDEEP M.D. [facility] 08/17/2014, 10:42 AM Pager: [phone]  If 7PM-7AM, please contact night-coverage [email or web address] Password [email or web address]

## 2014-08-18 LAB — GLUCOSE, CAPILLARY
GLUCOSE-CAPILLARY: 228 mg/dL — AB (ref 70–99)
Glucose-Capillary: 128 mg/dL — ABNORMAL HIGH (ref 70–99)
Glucose-Capillary: 132 mg/dL — ABNORMAL HIGH (ref 70–99)
Glucose-Capillary: 228 mg/dL — ABNORMAL HIGH (ref 70–99)
Glucose-Capillary: 255 mg/dL — ABNORMAL HIGH (ref 70–99)

## 2014-08-18 LAB — BASIC METABOLIC PANEL
Anion gap: 8 (ref 5–15)
BUN: 18 mg/dL (ref 6–23)
CO2: 29 mEq/L (ref 19–32)
Calcium: 8.3 mg/dL — ABNORMAL LOW (ref 8.4–10.5)
Chloride: 110 mEq/L (ref 96–112)
Creatinine, Ser: 1.08 mg/dL (ref 0.50–1.10)
GFR calc Af Amer: 57 mL/min — ABNORMAL LOW (ref >90)
GFR, EST NON AFRICAN AMERICAN: 49 mL/min — AB (ref >90)
Glucose, Bld: 141 mg/dL — ABNORMAL HIGH (ref 70–99)
Potassium: 3.7 mEq/L (ref 3.7–5.3)
Sodium: 147 mEq/L (ref 137–147)

## 2014-08-18 MED ORDER — ASPIRIN-DIPYRIDAMOLE ER 25-200 MG PO CP12
1.0000 | ORAL_CAPSULE | Freq: Two times a day (BID) | ORAL | Status: DC
  Administered 2014-08-18 – 2014-08-20 (×5): 1 via ORAL
  Filled 2014-08-18 (×5): qty 1

## 2014-08-18 MED ORDER — ENSURE PUDDING PO PUDG
1.0000 | Freq: Two times a day (BID) | ORAL | Status: DC
  Administered 2014-08-19 – 2014-08-20 (×4): 1 via ORAL

## 2014-08-18 MED ORDER — INSULIN ASPART 100 UNIT/ML ~~LOC~~ SOLN
0.0000 [IU] | Freq: Three times a day (TID) | SUBCUTANEOUS | Status: DC
  Administered 2014-08-18: 5 [IU] via SUBCUTANEOUS
  Administered 2014-08-19 (×2): 3 [IU] via SUBCUTANEOUS
  Administered 2014-08-20: 2 [IU] via SUBCUTANEOUS
  Administered 2014-08-20: 3 [IU] via SUBCUTANEOUS

## 2014-08-18 MED ORDER — LEVOTHYROXINE SODIUM 50 MCG PO TABS
50.0000 ug | ORAL_TABLET | Freq: Every day | ORAL | Status: DC
  Administered 2014-08-18 – 2014-08-20 (×3): 50 ug via ORAL
  Filled 2014-08-18 (×3): qty 1

## 2014-08-18 NOTE — Progress Notes (Signed)
Patient ID: [name] [name]  female  ZOX:[id number]    DOB: 08/18/1941    DOA: 08/06/2014  PCP: Alva Garnet., MD  Brief narrative:  Patient is a 73 year old with prior CVAs, hypertension, DM, hypothyroidism who presented to the hospital secondary to inability to speak. Upon further evaluation was found to have large acute left corpus callosum infarct. The patient has failed bedside swallow evaluation and after discussion with POA, palliative meeting, requested PEG tube placement as part of their goals of care. Interventional radiology was unable to place a PEG tube on 10/8. Gastroenterology also declined to place PEG tube due to high risk of an underwent inadvertently puncturing the colon while doing the procedure. Hence surgery was consulted who was contemplating putting an open PEG tube. However on 10/12 patient became more alert and awake, patient was evaluated by speech therapy and placed on dysphagia 1 diet. Surgery signed off and recommended to advance diet and there is no need of PEG tube. PANDA tube will be removed today.   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 - Patient was on Plavix 75 mg daily prior to admission, restarted Aggrenox (recommended by neurology) - Failed swallow evaluation initially, family requested a PEG tube, IR and GI unable to place G tube, surgery was planning to do open PEG tube however patient now more alert, communicative and following commands. - DC panda tube, advance diet as tolerated - PT recommended skilled nursing facility  Active Problems:  Hypernatremia improving    DM type 2,  uncontrolled, with renal complications - Blood sugars controlled, changed CBGs to q. AC and HS.     CKD (chronic kidney disease), stage III - Currently stable    Hypothyroidism - Continue IV Synthroid    COPD (chronic obstructive pulmonary disease) - Currently stable   Dysphagia, pharyngoesophageal phase - Diet advance to dysphagia 1, hopefully if this can be evaluated today to upgrade diet given her improved mental status   DVT Prophylaxis:  Code Status:  Family Communication: Discussed in detail with patient's son, Mr Uvaldo Rising on phone.    Disposition: Transition to skilled nursing facility hopefully tomorrow if tolerating diet  Consultants:  Neurology  Palliative care  general surgery  GI   Antibiotics:  None    Subjective: Much more alert and awake today, following commands, appropriately responded to questions  Objective: Weight change:   Intake/Output Summary (Last 24 hours) at 08/18/14 1043 Last data filed at 08/18/14 0816  Gross per 24 hour  Intake    240 ml  Output      0 ml  Net    240 ml   Blood pressure 112/42, pulse 103, temperature 98.1 F (36.7 C), temperature source Axillary, resp. rate 20, height 5\' 3"  (1.6 m), weight 76 kg (167 lb 8.8 oz), SpO2 100.00%.  Physical Exam: General: Alert and awake NAD  CVS: S1-S2 clear, no murmur rubs or gallops Chest: CTAB Abdomen: soft NT, ND, NBS Extremities: no c/C/E   Lab Results: Basic Metabolic Panel:  Recent Labs Lab 08/15/14 0442 08/18/14 0608  NA 147 147  K 3.8 3.7  CL 110 110  CO2 26 29  GLUCOSE 229*  141*  BUN 10 18  CREATININE 1.04 1.08  CALCIUM 8.5 8.3*   Liver Function Tests: No results found for this basename: AST, ALT, ALKPHOS, BILITOT, PROT, ALBUMIN,  in the last 168 hours No results found for this basename: LIPASE, AMYLASE,  in the last 168 hours No results found for this basename: AMMONIA,  in the last 168 hours CBC:  Recent Labs Lab 08/13/14 0830  WBC 4.6  HGB  9.8*  HCT 31.3*  MCV 92.1  PLT 265   Cardiac Enzymes: No results found for this basename: CKTOTAL, CKMB, CKMBINDEX, TROPONINI,  in the last 168 hours BNP: No components found with this basename: POCBNP,  CBG:  Recent Labs Lab 08/17/14 1600 08/17/14 1958 08/17/14 2347 08/18/14 0459 08/18/14 0826  GLUCAP 171* 129* 118* 128* 132*     Micro Results: No results found for this or any previous visit (from the past 240 hour(s)).  Studies/Results: Ct Head Wo Contrast  08/06/2014   CLINICAL DATA:  Code stroke.  Facial droop.  EXAM: CT HEAD WITHOUT CONTRAST  TECHNIQUE: Contiguous axial images were obtained from the base of the skull through the vertex without intravenous contrast.  COMPARISON:  08/04/2014  FINDINGS: Diffuse cerebral atrophy. Ventricular dilatation consistent with central atrophy. Low-attenuation changes throughout the deep white matter consistent with small vessel ischemia. Old lacunar infarcts in the right caudate lobe, left thalamus, and left cerebellum. No change since previous study. No mass effect or midline shift. No abnormal extra-axial fluid collections. Gray-white matter junctions are distinct. Basal cisterns are not effaced. No evidence of acute intracranial hemorrhage. No depressed skull fractures. Small retention cyst in the sphenoid sinus. Mastoid air cells are not opacified.  IMPRESSION: No acute intracranial abnormalities. Chronic atrophy and small vessel ischemic changes with multiple old lacunar infarcts.  These results were called by telephone at the time of interpretation on 08/06/2014 at 10:52 pm to Dr. Vanetta Mulders , who verbally acknowledged these results.   Electronically Signed   By: Burman Nieves M.D.   On: 08/06/2014 22:54   Ct Head Wo Contrast  08/04/2014   CLINICAL DATA:  Facial droop and left upper extremity paresis.  EXAM: CT HEAD WITHOUT CONTRAST  TECHNIQUE: Contiguous axial images were obtained from the base of the skull through the vertex  without intravenous contrast.  COMPARISON:  CT scan of July 24, 2014.  FINDINGS: Bony calvarium appears intact. Mild diffuse cortical atrophy is noted. Moderate chronic ischemic white matter disease is noted. Stable calcifications are noted in the basal ganglia bilaterally. No mass effect or midline shift is noted. Ventricular size is within normal limits. There is no evidence of mass lesion, hemorrhage or acute infarction.  IMPRESSION: Mild diffuse cortical atrophy. Moderate chronic ischemic white matter disease. No acute intracranial abnormality seen.   Electronically Signed   By: Roque Lias M.D.   On: 08/04/2014 14:02   Ct Head Wo Contrast  07/24/2014   CLINICAL DATA:  Elevated blood glucose, hypertensive, mental status change  EXAM: CT HEAD WITHOUT CONTRAST  TECHNIQUE: Contiguous axial images were obtained from the base of the skull through the vertex without intravenous contrast.  COMPARISON:  06/12/2014  FINDINGS: There is no evidence of mass effect, midline shift, or extra-axial fluid collections. There is no evidence of a space-occupying lesion or intracranial hemorrhage. There is no evidence of a cortical-based area of acute infarction. There is an old right basal ganglia and bilateral thalamic lacunar infarct. There is generalized cerebral atrophy. There is periventricular white matter  low attenuation likely secondary to microangiopathy.  The ventricles and sulci are appropriate for the patient's age. The basal cisterns are patent.  Visualized portions of the orbits are unremarkable. The visualized portions of the paranasal sinuses and mastoid air cells are unremarkable. Cerebrovascular atherosclerotic calcifications are noted.  The osseous structures are unremarkable.  IMPRESSION: No acute intracranial pathology.   Electronically Signed   By: Elige KoHetal  Patel   On: 07/24/2014 11:06   Mr Maxine GlennMra Head Wo Contrast  08/08/2014   CLINICAL DATA:  Sudden onset of inability to speak beginning yesterday.  Symptoms have now resolved. Stroke risk factors include previous stroke, diabetes, hypertension, hyperlipidemia, and chronic renal disease.  EXAM: MRI HEAD WITHOUT CONTRAST  MRA HEAD WITHOUT CONTRAST  TECHNIQUE: Multiplanar, multiecho pulse sequences of the brain and surrounding structures were obtained without intravenous contrast. Angiographic images of the head were obtained using MRA technique without contrast.  COMPARISON:  CT head 08/06/2014.  MR head 04/24/14  FINDINGS: MRI HEAD FINDINGS  Subcentimeter acute infarct in the LEFT corpus callosum just lateral and superior to the genu. This lies within the LEFT ACA territory. No other areas of acute infarction are identified. No hemorrhage, mass lesion, or extra-axial fluid.  Hydrocephalus ex vacuo. Extensive small vessel disease throughout the periventricular and subcortical white matter. Widespread remote basal ganglia, thalamic, and deep white matter infarcts some of which were acute on the previous study from June. Partial empty sella. Cervical spondylosis. No osseous lesions. BILATERAL cataract extraction. Chronic sinus and mastoid fluid.  MRA HEAD FINDINGS  Misregistration of the process images precludes accurate stenosis evaluation. There is gross patency of the internal carotid arteries and basilar artery with RIGHT vertebral dominant. Suspected moderate RIGHT cavernous ICA disease and mild LEFT cavernous ICA disease suspected mild disease of the distal LEFT vertebral. There is no proximal flow reducing lesion of the anterior, middle, or posterior cerebral arteries. Both distal anterior cerebral arteries appear patent.  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  Advanced atrophy, small vessel disease, and widespread remote deep nuclei and deep white matter infarcts.  Patient motion results in misregistration of the MRA exam. Gross patency of the intracranial vasculature, including both anterior cerebral  arteries, is established however.   Electronically Signed   By: Davonna BellingJohn  Curnes M.D.   On: 08/08/2014 09:54   Mr Brain Wo Contrast  08/08/2014   CLINICAL DATA:  Sudden onset of inability to speak beginning yesterday. Symptoms have now resolved. Stroke risk factors include previous stroke, diabetes, hypertension, hyperlipidemia, and chronic renal disease.  EXAM: MRI HEAD WITHOUT CONTRAST  MRA HEAD WITHOUT CONTRAST  TECHNIQUE: Multiplanar, multiecho pulse sequences of the brain and surrounding structures were obtained without intravenous contrast. Angiographic images of the head were obtained using MRA technique without contrast.  COMPARISON:  CT head 08/06/2014.  MR head 04/24/14  FINDINGS: MRI HEAD FINDINGS  Subcentimeter acute infarct in the LEFT corpus callosum just lateral and superior to the genu. This lies within the LEFT ACA territory. No other areas of acute infarction are identified. No hemorrhage, mass lesion, or extra-axial fluid.  Hydrocephalus ex vacuo. Extensive small vessel disease throughout the periventricular and subcortical white matter. Widespread remote basal ganglia, thalamic, and deep white matter infarcts some of which were acute on the previous study from June. Partial empty sella. Cervical spondylosis. No osseous lesions. BILATERAL cataract extraction. Chronic sinus and mastoid fluid.  MRA HEAD FINDINGS  Misregistration of the process images precludes accurate stenosis evaluation.  There is gross patency of the internal carotid arteries and basilar artery with RIGHT vertebral dominant. Suspected moderate RIGHT cavernous ICA disease and mild LEFT cavernous ICA disease suspected mild disease of the distal LEFT vertebral. There is no proximal flow reducing lesion of the anterior, middle, or posterior cerebral arteries. Both distal anterior cerebral arteries appear patent.  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  Advanced atrophy, small vessel  disease, and widespread remote deep nuclei and deep white matter infarcts.  Patient motion results in misregistration of the MRA exam. Gross patency of the intracranial vasculature, including both anterior cerebral arteries, is established however.   Electronically Signed   By: Davonna Belling M.D.   On: 08/08/2014 09:54   Dg Chest Port 1 View  08/07/2014   CLINICAL DATA:  Short of breath. Labored in shallow breathing. Unresponsive.  EXAM: PORTABLE CHEST - 1 VIEW  COMPARISON:  07/29/2014  FINDINGS: Probable linear atelectasis in the left mid lung. Normal heart size and pulmonary vascularity. No focal airspace disease in the lungs. No blunting of costophrenic angles. No pneumothorax. Mediastinal contours appear intact.  IMPRESSION: No active disease.   Electronically Signed   By: Burman Nieves M.D.   On: 08/07/2014 00:02   Dg Chest Port 1 View  07/29/2014   CLINICAL DATA:  Intubation.  EXAM: PORTABLE CHEST - 1 VIEW  COMPARISON:  07/28/2014.  FINDINGS: Endotracheal tube 1.9 cm above the carina. Left IJ line in NG tube in stable position. Cardiac monitor stable position. Heart size is stable. Pulmonary vascularity is normal. Bibasilar atelectasis and/or infiltrates are present. Small bilateral pleural effusions cannot be excluded. A mild component congestive heart failure cannot be excluded. No pneumothorax. No acute osseus abnormality.  IMPRESSION: 1. Interval reposition of endotracheal tube. Its tip is now 1.9 cm above the carina left IJ line, and NG tube in stable position. 2. Interval development of bibasilar infiltrates and possible tiny pleural effusions. Findings suggest mild CHF. Bibasilar pneumonia cannot be excluded.   Electronically Signed   By: Maisie Fus  Register   On: 07/29/2014 07:45   Dg Chest Portable 1 View  07/28/2014   CLINICAL DATA:  Endotracheal tube and orogastric tube placement  EXAM: PORTABLE CHEST - 1 VIEW  COMPARISON:  07/27/2014  FINDINGS: Endotracheal tube is seen with tip directly at  the carina directed toward left main bronchus. Left internal jugular central line is seen with tip over the junction of the brachiocephalic vein and superior vena cava. There is no pneumothorax. NG tube crosses the gastroesophageal junction.  Right lung is clear. Partial consolidation left lower lobe most consistent with partial atelectasis, improved when compared to the prior study. When compared to prior study there is improved aeration in the left lower lobe.  IMPRESSION: Endotracheal tube at the carina. Consider retracting the tube 2-3 cm and repeating the image. Continued atelectasis left lower lobe, but improved when compared to prior study. These results were called by telephone at the time of interpretation on 07/28/2014 at 2:23 am to Joy, the patient's charge nurse, who verbally acknowledged these results.   Electronically Signed   By: Esperanza Heir M.D.   On: 07/28/2014 02:23   Dg Chest Port 1 View  07/27/2014   CLINICAL DATA:  Pneumonitis.  EXAM: PORTABLE CHEST - 1 VIEW  COMPARISON:  07/26/2014.  FINDINGS: Endotracheal tube ends between the clavicular heads and carina. Left IJ catheter is likely stable in positioning, although there is new distortion from  leftward rotation. Orogastric tube enters the stomach at least.  There is no change in heart size and mediastinal contours when accounting for rotation. Implantable cardiac monitor has a stable orientation.  Worsening basilar lung aeration. Hazy appearance favors atelectasis and possibly small pleural effusion. There is no edema or pneumothorax.  IMPRESSION: 1. Stable positioning of tubes and central line. 2. Significant improvement and left upper lobe aeration. Rapid clearing favors pneumonitis or atelectasis over pneumonia. 3. New bibasilar atelectasis.   Electronically Signed   By: Tiburcio Pea M.D.   On: 07/27/2014 06:25   Dg Chest Port 1 View  07/26/2014   CLINICAL DATA:  Ventilator dependent respiratory failure. Followup left upper lobe  pneumonia.  EXAM: PORTABLE CHEST - 1 VIEW  COMPARISON:  Portable chest x-rays yesterday, 07/24/2014, 06/13/2014.  FINDINGS: Endotracheal tube tip in satisfactory position projecting approximately 4-5 cm above the carina. Nasogastric tube courses below the diaphragm into the stomach. Left jugular sent venous catheter tip projects over the upper SVC, unchanged.  Cardiac silhouette upper normal in size to slightly enlarged but stable. Airspace consolidation in the left upper lobe, slightly worse than on yesterday's examination. No new pulmonary parenchymal abnormalities.  IMPRESSION: Support apparatus satisfactory. Slight worsening of left upper lobe pneumonia since yesterday. No new abnormalities.   Electronically Signed   By: Hulan Saas M.D.   On: 07/26/2014 09:10   Portable Chest Xray In Am  07/25/2014   CLINICAL DATA:  Follow-up endotracheal tube position.  EXAM: PORTABLE CHEST - 1 VIEW  COMPARISON:  07/24/2014  FINDINGS: Endotracheal tube remains in place with tip approximately 2-2.5 cm above the carina. Left jugular central venous catheter is unchanged with tip near the brachiocephalic vein confluence. Enteric tube courses into the left upper abdomen. Loop recorder is again seen. Cardiac silhouette is within normal limits for size. Left perihilar opacity has decreased from the prior study. Right lung remains clear. No definite pleural effusion or pneumothorax is identified.  IMPRESSION: 1. Endotracheal tube 2-2.5 cm above the carina. 2. Improved left upper lobe aeration.   Electronically Signed   By: Sebastian Ache   On: 07/25/2014 09:13   Portable Chest Xray  07/24/2014   CLINICAL DATA:  Interval intubation of the patient ; assess support tube and line. Placement  EXAM: PORTABLE CHEST - 1 VIEW  COMPARISON:  Portable chest x-ray of today's date.  FINDINGS: The endotracheal tube tip lies 2 cm above the crotch of the carina. The right internal jugular venous catheter tip lies at the level of the junction  of the right and left brachiocephalic veins. The esophagogastric tube tip projects below the inferior margin of the image. A loop recorder is visible over the left atrial region. The cardiac silhouette is normal in size. The pulmonary vascularity is not engorged. There is confluent alveolar opacity in the left perihilar region that is more conspicuous than on the earlier study. The bony thorax exhibits no acute abnormality.  IMPRESSION: 1. The endotracheal tube tip lies 2.1 cm above the crotch of the carina. Withdrawal by 2-3 cm is recommended to avoid accidental mainstem bronchus intubation. 2. The left internal jugular venous catheter and the esophagogastric tube are in appropriate position radiographically. There is no postprocedure pneumothorax or hemothorax. 3. There is increased alveolar opacity in the low left perihilar region consistent with pneumonia. 4. These results were called by telephone at the time of interpretation on 07/24/2014 at 7:21 pm to [facility], RN, who verbally acknowledged these results.   Electronically  Signed   By: David  SwazilandJordan   On: 07/24/2014 19:26   Dg Chest Port 1 View  07/24/2014   CLINICAL DATA:  Recent aspiration  EXAM: PORTABLE CHEST - 1 VIEW  COMPARISON:  06/13/2014  FINDINGS: The cardiac shadow is stable. Monitoring device is again seen. The right lung is clear. The left lung demonstrates patchy infiltrate in the upper lobe which may be related to the patient's recent aspiration.  IMPRESSION: Left upper lobe infiltrate. Followup films to resolution are recommended.   Electronically Signed   By: Alcide CleverMark  Lukens M.D.   On: 07/24/2014 11:09   Dg Abd Portable 1v  07/30/2014   CLINICAL DATA:  Feeding tube placement  EXAM: PORTABLE ABDOMEN - 1 VIEW  COMPARISON:  Portable exam 1642 hr compared to 07/28/2014  FINDINGS: Feeding tube coiled at mid stomach with tip reflected to fundus.  Nonobstructive bowel gas pattern.  Minimal atelectasis and questionable fusions at lung bases.   Loop recorder projects over chest.  IMPRESSION: Feeding tube coiled at mid to proximal stomach.   Electronically Signed   By: Ulyses SouthwardMark  Boles M.D.   On: 07/30/2014 16:55   Dg Abd Portable 1v  07/28/2014   CLINICAL DATA:  Endotracheal tube and orogastric tube placement  EXAM: PORTABLE ABDOMEN - 1 VIEW  COMPARISON:  None.  FINDINGS: Orogastric tube crosses the gastroesophageal junction with tip in the left upper quadrant in the anticipated position of stomach. Endotracheal tube is also seen on this study with tip about 12 mm above the carinal.  IMPRESSION: Endotracheal tube and OG tube as described.   Electronically Signed   By: Esperanza Heiraymond  Rubner M.D.   On: 07/28/2014 02:24   Dg Swallowing Func-speech Pathology  08/01/2014   Riley NearingBonnie Caroline Deblois, CCC-SLP     08/01/2014  1:43 PM Objective Swallowing Evaluation: Modified Barium Swallowing Study   Patient Details  Name: [name] FullingBetty J Queener MRN: [id number] Date of Birth: [birth year]  Today's Date: 08/01/2014 Time: 1300-1330 SLP Time Calculation (min): 30 min  Past Medical History:  Past Medical History  Diagnosis Date  . Hypertension   . Diabetes mellitus   . Chronic renal insufficiency   . Hyperlipidemia   . Osteopenia   . CVA (cerebral infarction)   . Thyroid disease    Past Surgical History:  Past Surgical History  Procedure Laterality Date  . Eye surgery    . Loop recorder implant  03-24-2014    MDT LinQ implanted by Dr Johney FrameAllred for cryptogenic stroke  . Tee without cardioversion N/A 03/23/2014    Procedure: TRANSESOPHAGEAL ECHOCARDIOGRAM (TEE);  Surgeon:  Lars MassonKatarina H Nelson, MD;  [facility];  Service:  Cardiovascular;  Laterality: N/A;   HPI:  [name] with COPD, CVA, CK D., Diabetes admitted 9/18  with acute respiratory distress , hypercarbic respiratory  failure, DKA with glucose of 802 and anion gap of 19 , requiring  mechanical ventilation. Chest x-ray showed left upper lobe  infiltrate. Was placed on BiPAP initially, but unresponsive on  arrival to the  ICU, hence intubated. She had recent admit from  8/6 to 814 for hypercarbic failure that improved with BiPAP-dc'd  to NH , then home since 9/14. 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 Patient has been seen by SLP  during previous admissions and has been observed with a delayed  oral phase and suspected delayed swallow initiation with s/s of  aspiration with thin liquids via straw. Most frequent diet  recommendations for dysphagia 1  solids, thin liquids.      Assessment / Plan / Recommendation Clinical Impression  Dysphagia Diagnosis: Moderate oral phase dysphagia;Mild  pharyngeal phase dysphagia Clinical impression: Pt presents with a primary cognitive based,  oral dyspahgia with lingual pumping and piecemeal transit of PO,  leading to multiple swallows per bolus. Swallow response is only  slightly delayed but mild base of tongue weakness results in  trace vallecular residuals and very trace penetration after the  swallow with nectar thick liquids. Pt senses penetrate and expels  with a soft throat clear. Given overt aspiration of thin at  bedside and pts AMS, thin liquids not tested though expect pt  will be able to upgrade as mentation improves. Pt to initiate dys  1 (puree) diet and nectar thick liquids. SLP will follow for  tolerance.      Treatment Recommendation  Therapy as outlined in treatment plan below    Diet Recommendation Dysphagia 1 (Puree);Nectar-thick liquid   Liquid Administration via: Cup;Straw Medication Administration: Whole meds with puree Supervision: Staff to assist with self feeding Compensations: Slow rate;Small sips/bites Postural Changes and/or Swallow Maneuvers: Seated upright 90  degrees;Upright 30-60 min after meal    Other  Recommendations Oral Care Recommendations: Oral care BID Other Recommendations: Order thickener from pharmacy   Follow Up Recommendations  Skilled Nursing facility    Frequency and Duration min 2x/week  2 weeks   Pertinent Vitals/Pain NA     SLP Swallow Goals     General HPI: [name] with COPD, CVA, CK D., Diabetes  admitted 9/18 with acute respiratory distress , hypercarbic  respiratory failure, DKA with glucose of 802 and anion gap of 19  , requiring mechanical ventilation. Chest x-ray showed left upper  lobe infiltrate. Was placed on BiPAP initially, but unresponsive  on arrival to the ICU, hence intubated. She had recent admit from  8/6 to 814 for hypercarbic failure that improved with BiPAP-dc'd  to NH , then home since 9/14. 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 Patient has been seen by SLP  during previous admissions and has been observed with a delayed  oral phase and suspected delayed swallow initiation with s/s of  aspiration with thin liquids via straw. Most frequent diet  recommendations for dysphagia 1 solids, thin liquids.  Type of Study: Modified Barium Swallowing Study Reason for Referral: Objectively evaluate swallowing function Previous Swallow Assessment: see HPI Diet Prior to this Study: NPO Temperature Spikes Noted: No Respiratory Status: Room air History of Recent Intubation: Yes Length of Intubations (days): 2 days Date extubated: 07/29/14 Behavior/Cognition: Alert;Cooperative;Requires cueing Oral Cavity - Dentition: Edentulous Oral Motor / Sensory Function: Within functional limits Self-Feeding Abilities: Total assist Patient Positioning: Upright in chair Baseline Vocal Quality: Clear Volitional Cough: Weak Volitional Swallow: Able to elicit Pharyngeal Secretions: Not observed secondary MBS    Reason for Referral Objectively evaluate swallowing function   Oral Phase Oral Preparation/Oral Phase Oral Phase: Impaired Oral - Nectar Oral - Nectar Cup: Lingual pumping;Reduced posterior  propulsion;Piecemeal swallowing;Delayed oral transit Oral - Nectar Straw: Lingual pumping;Reduced posterior  propulsion;Piecemeal swallowing;Delayed oral transit Oral - Solids Oral - Puree: Lingual pumping;Reduced  posterior  propulsion;Piecemeal swallowing;Delayed oral transit   Pharyngeal Phase Pharyngeal Phase Pharyngeal Phase: Impaired Pharyngeal - Nectar Pharyngeal - Nectar Cup: Reduced tongue base  retraction;Penetration/Aspiration after swallow;Pharyngeal  residue - valleculae Penetration/Aspiration details (nectar cup): Material enters  airway, CONTACTS cords then ejected out;Material does not enter  airway Pharyngeal - Nectar Straw: Reduced tongue  base  retraction;Penetration/Aspiration after swallow;Pharyngeal  residue - valleculae Penetration/Aspiration details (nectar straw): Material does not  enter airway;Material enters airway, CONTACTS cords then ejected  out Pharyngeal - Solids Pharyngeal - Puree: Reduced tongue base retraction;Pharyngeal  residue - valleculae Penetration/Aspiration details (puree): Material does not enter  airway  Cervical Esophageal Phase    GO    Cervical Esophageal Phase Cervical Esophageal Phase: Impaired Cervical Esophageal Phase - Comment Cervical Esophageal Comment: Appearance of slow transit, stasis  through GE junction        [employer], MA CCC-SLP [phone]  DeBlois, Riley Nearing 08/01/2014, 1:42 PM     Medications: Scheduled Meds: . antiseptic oral rinse  7 mL Mouth Rinse q12n4p  . atorvastatin  40 mg Oral Daily  . budesonide-formoterol  2 puff Inhalation BID  . chlorhexidine  15 mL Mouth Rinse BID  . cloNIDine  0.3 mg Transdermal Weekly  . feeding supplement (PRO-STAT SUGAR FREE 64)  30 mL Oral BID  . heparin  5,000 Units Subcutaneous 3 times per day  . hydrALAZINE  20 mg Intravenous Q8H  . insulin aspart  0-9 Units Subcutaneous 6 times per day  . insulin glargine  12 Units Subcutaneous Daily  . isosorbide dinitrate  10 mg Oral TID  . levothyroxine  25 mcg Intravenous Daily      LOS: 12 days   RAI,RIPUDEEP M.D. [facility] 08/18/2014, 10:43 AM Pager: [phone]  If 7PM-7AM, please contact night-coverage [email or web address] Password [email or web address]

## 2014-08-18 NOTE — Progress Notes (Signed)
Occupational Therapy Treatment Patient Details Name: Erin FullingBetty J Mavis MRN: [id number] DOB: [birth year] Today's Date: 08/18/2014    History of present illness 73 yo female [facility] due to [facility] and expressive aphasia. BP on arrival 212/75. 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 PMHx includes recent admission (07/2014) with respiratory failure, DM, and CVA.    OT comments  Pt demonstrates sit<>stand transfer to chair, incr verbalizations, and grooming task. Pt has progressed compared to previous sessions. Recommending return to SNF. Ot to follow acutely for adl retraining and static sitting balance   Follow Up Recommendations  SNF;Supervision/Assistance - 24 hour    Equipment Recommendations  Hospital bed;Wheelchair cushion (measurements OT);Wheelchair (measurements OT);3 in 1 bedside comode    Recommendations for Other Services      Precautions / Restrictions Precautions Precautions: Fall Precaution Comments: left sided weakness, pusher with very strong grasp bil (R>L) watch BP       Mobility Bed Mobility Overal bed mobility: Needs Assistance Bed Mobility: Rolling;Sidelying to Sit;Supine to Sit Rolling: Mod assist Sidelying to sit: Mod assist Supine to sit: +2 for physical assistance;Min assist     General bed mobility comments: cues to sequence tacitle hand over hand and pt progressing through motions. pt pushign up with LUE  Transfers Overall transfer level: Needs assistance Equipment used: 2 person hand held assist Transfers: Sit to/from [employer]Stand;Stand Pivot Transfers Sit to Stand: +2 physical assistance;Mod assist Stand pivot transfers: +2 physical assistance;Max assist       General transfer comment: Pt requires (A) for weight shift and advancement of LLE to pivot L    Balance Overall balance assessment: Needs assistance Sitting-balance support: Bilateral upper extremity supported;Feet supported Sitting balance-Leahy Scale: Fair   Postural  control: Right lateral lean Standing balance support: Bilateral upper extremity supported;During functional activity Standing balance-Leahy Scale: Poor                     ADL Overall ADL's : Needs assistance/impaired     Grooming: Wash/dry face;Minimal assistance;Sitting Grooming Details (indicate cue type and reason): cues for sequence and to wipe eye area. pt able to complete with mIN (A) at elbow                 Toilet Transfer: +2 for physical assistance;Maximal assistance;BSC;Stand-pivot             General ADL Comments: Pt supine on arrival and reports that she is ready to get OOB. pt sitting EOB wtih R lateral lean. pt provide tactile input at R side of head for neutral head alignment with pt sitting min (A) level. Pt with incr posture with cervical alignment. pt transfered to chair total +2 (A) with facilitation of weight shift and (A) to advance L LE. pt positioned in chair. BP monitored in supnie sit stand. tech placing values in chart. pt with decr in BP this session. Pt sitting in chair and reports "YES THANK YOU JESUS" when asked if comfortable. Pt oriented to self place and dob      Vision                     Perception     Praxis      Cognition   Behavior During Therapy: [facility] for tasks assessed/performed Overall Cognitive Status: History of cognitive impairments - at baseline                       Extremity/Trunk Assessment  Exercises     Shoulder Instructions       General Comments      Pertinent Vitals/ Pain       Pain Assessment: No/denies pain  Home Living                                          Prior Functioning/Environment              Frequency Min 1X/week     Progress Toward Goals  OT Goals(current goals can now be found in the care plan section)  Progress towards OT goals: Progressing toward goals  Acute Rehab OT Goals Patient Stated Goal: sit in chair OT  Goal Formulation: Patient unable to participate in goal setting Time For Goal Achievement: 08/24/14 Potential to Achieve Goals: Good ADL Goals Pt Will Perform Grooming: sitting;with min guard assist Additional ADL Goal #1: Pt will static sit EOB with min (A) for 5 minutes Additional ADL Goal #2: Pt will complete bed mobility min (A) with bed rails and hob elevated  Plan Discharge plan remains appropriate;Other (comment) (goals updated)    Co-evaluation                 End of Session Equipment Utilized During Treatment: Gait belt   Activity Tolerance Patient tolerated treatment well   Patient Left in chair;with call bell/phone within reach;with chair alarm set;with family/visitor present   Nurse Communication Mobility status;Precautions        Time: [id number] OT Time Calculation (min): 17 min  Charges: OT General Charges $OT Visit: 1 Procedure OT Treatments $Therapeutic Activity: 8-22 mins  Harolyn RutherfordJones, Obaloluwa Delatte B 08/18/2014, 5:23 PM Pager: [phone]

## 2014-08-18 NOTE — Progress Notes (Signed)
NUTRITION FOLLOW UP  Intervention:   Recommend discontinuing NGT/TF's Provide Ensure Pudding BID in between meals; each supplement provides 170 kcal and 4 grams of protein Provide Magic Cup ice cream BID with lunch and dinner, each supplement provides 290 kcal and 9 grams of protein Supplements combined will provide 920 kcal and 26 grams of protein.   If PO intake of meals is consistently less than 50%, will add Pro-stat supplement for additional protein  Nutrition Dx:   Inadequate oral intake related to inability to eat, lethargy as evidenced by NPO status; ongoing  Goal:   Pt to meet >/= 90% of their estimated nutrition needs; unmet  Monitor:   TF initiation, PO diet advancement, weight, labs, I/O's  Assessment:   73 y.o. female with PMH of CVA, hypertension, diabetes mellitus, hypothyroidism, recent admission for respiratory failure requiring intubation; presented with complaints of inability to speak and refusing to take her medication.  GI and IR were unable to place a PEG which was requested by the family. Small bore feeding tube was placed in right nares on 10/10 per nursing notes. Pt received Jevity 1.2 @ 50 ml/hr until 10/11. Pt was re-assessed by SLP on 10/12 and diet was able to be advanced to Dysphagia 1 with nectar-thick liquids. Per nursing notes pt consumed 35% of her breakfast this morning. Pt has NGT in place but, TF are turned off. Per RN pt likes Ensure Pudding and Magic Cup ice cream; pt needs to be fed.  Per bed scale today, pt weighs 170 lbs.   Labs reviewed. Elevated glucose, low calcium, decreased GFR +3.2 L fluid balance since admission   Height: Ht Readings from Last 1 Encounters:  08/07/14 5\' 3"  (1.6 m)    Weight Status:   Wt Readings from Last 1 Encounters:  08/08/14 167 lb 8.8 oz (76 kg)    Re-estimated needs:  Kcal: 1600-1800  Protein: 90-100 gm  Fluid: 1.7-1.9 L   Skin: stage II pressure ulcer to mid-sacrum; +1 generalized edema, non-pitting  RUE, LUE, RLE, and LLE edema  Diet Order: Dysphagia 1, nectar-thick liquids   Intake/Output Summary (Last 24 hours) at 08/18/14 1036 Last data filed at 08/18/14 0816  Gross per 24 hour  Intake    240 ml  Output      0 ml  Net    240 ml    Last BM: 10/10   Labs:   Recent Labs Lab 08/14/14 0500 08/15/14 0442 08/18/14 0608  NA 151* 147 147  K 4.3 3.8 3.7  CL 113* 110 110  CO2 24 26 29   BUN 12 10 18   CREATININE 1.19* 1.04 1.08  CALCIUM 8.4 8.5 8.3*  GLUCOSE 185* 229* 141*    CBG (last 3)   Recent Labs  08/17/14 2347 08/18/14 0459 08/18/14 0826  GLUCAP 118* 128* 132*    Scheduled Meds: . antiseptic oral rinse  7 mL Mouth Rinse q12n4p  . atorvastatin  40 mg Oral Daily  . budesonide-formoterol  2 puff Inhalation BID  . chlorhexidine  15 mL Mouth Rinse BID  . cloNIDine  0.3 mg Transdermal Weekly  . feeding supplement (PRO-STAT SUGAR FREE 64)  30 mL Oral BID  . heparin  5,000 Units Subcutaneous 3 times per day  . hydrALAZINE  20 mg Intravenous Q8H  . insulin aspart  0-9 Units Subcutaneous 6 times per day  . insulin glargine  12 Units Subcutaneous Daily  . isosorbide dinitrate  10 mg Oral TID  . levothyroxine  25  mcg Intravenous Daily    Continuous Infusions: . feeding supplement (JEVITY 1.2 CAL) 1,000 mL (08/16/14 0656)    Ian Malkineanne Barnett RD, LDN Inpatient Clinical Dietitian Pager: [phone] After Hours Pager: [phone]

## 2014-08-18 NOTE — Progress Notes (Signed)
Speech Language Pathology Treatment: Dysphagia  Patient Details Name: [name] FullingBetty J [name] MRN: [id number] DOB: [birth year] Today's Date: 08/18/2014 Time: [id number] SLP Time Calculation (min): 20 min  Assessment / Plan / Recommendation Clinical Impression  Pt again demonstrated consistent arousal, willingness to accept PO, stating "im hungry," repeatedly. Pt was able to to consistently verbalize wants and need with min question cues without evidence of aphasia. SLP provided moderate verbal and tactile cues to discourage lingual pumping with purees, otherwise pts intake was automatic and functional. Timing of swallow mildly delayed, but no overt signs of aspiration observed with honey or puree. Given severity of pts impairments and probability of waxing and waning function, risk of aspiration and limited nutritional  support still expected. Recommend continuing diet with goal of comfort with known risk without alternate nutrition.    HPI HPI: [name] with COPD, CVA, CK D., Diabetes admitted from SNF after refusing her medication, was nonverbal, brought in as a code stroke.  [facility] due to respiratory distress.  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     Pertinent Vitals Pain Assessment: No/denies pain  SLP Plan  Continue with current plan of care    Recommendations Diet recommendations: Dysphagia 1 (puree);Honey-thick liquid Liquids provided via: Teaspoon Medication Administration: Via alternative means Supervision: Staff to assist with self feeding;Full supervision/cueing for compensatory strategies Compensations: Slow rate;Small sips/bites Postural Changes and/or Swallow Maneuvers: Seated upright 90 degrees;Upright 30-60 min after meal              Oral Care Recommendations: Oral care BID Follow up Recommendations: Skilled Nursing facility Plan:  Continue with current plan of care    GO    [facility] CenterBonnie Mahina Salatino, MA CCC-SLP [phone]  Claudine MoutonDeBlois, Kazim Corrales Caroline 08/18/2014, 12:18 PM

## 2014-08-19 DIAGNOSIS — E785 Hyperlipidemia, unspecified: Secondary | ICD-10-CM

## 2014-08-19 LAB — BASIC METABOLIC PANEL
Anion gap: 10 (ref 5–15)
BUN: 18 mg/dL (ref 6–23)
CHLORIDE: 110 meq/L (ref 96–112)
CO2: 28 mEq/L (ref 19–32)
Calcium: 8.5 mg/dL (ref 8.4–10.5)
Creatinine, Ser: 1.14 mg/dL — ABNORMAL HIGH (ref 0.50–1.10)
GFR calc non Af Amer: 46 mL/min — ABNORMAL LOW (ref >90)
GFR, EST AFRICAN AMERICAN: 53 mL/min — AB (ref >90)
Glucose, Bld: 207 mg/dL — ABNORMAL HIGH (ref 70–99)
POTASSIUM: 3.6 meq/L — AB (ref 3.7–5.3)
Sodium: 148 mEq/L — ABNORMAL HIGH (ref 137–147)

## 2014-08-19 LAB — GLUCOSE, CAPILLARY
GLUCOSE-CAPILLARY: 195 mg/dL — AB (ref 70–99)
Glucose-Capillary: 207 mg/dL — ABNORMAL HIGH (ref 70–99)
Glucose-Capillary: 228 mg/dL — ABNORMAL HIGH (ref 70–99)
Glucose-Capillary: 118 mg/dL — ABNORMAL HIGH (ref 70–99)
Glucose-Capillary: 135 mg/dL — ABNORMAL HIGH (ref 70–99)

## 2014-08-19 LAB — CBC
HCT: 27.4 % — ABNORMAL LOW (ref 36.0–46.0)
HEMOGLOBIN: 8.9 g/dL — AB (ref 12.0–15.0)
MCH: 29.1 pg (ref 26.0–34.0)
MCHC: 32.5 g/dL (ref 30.0–36.0)
MCV: 89.5 fL (ref 78.0–100.0)
PLATELETS: 219 10*3/uL (ref 150–400)
RBC: 3.06 MIL/uL — ABNORMAL LOW (ref 3.87–5.11)
RDW: 13.5 % (ref 11.5–15.5)
WBC: 4.7 10*3/uL (ref 4.0–10.5)

## 2014-08-19 NOTE — Progress Notes (Signed)
Patient ID: [name] [name]  female  ZOX:[id number]    DOB: Jul 12, 1941    DOA: 08/06/2014  PCP: Alva Garnet., MD  Brief narrative:  Patient is a 73 year old with prior CVAs, hypertension, DM, hypothyroidism who presented to the hospital secondary to inability to speak. Upon further evaluation was found to have large acute left corpus callosum infarct. The patient has failed bedside swallow evaluation and after discussion with POA, palliative meeting, requested PEG tube placement as part of their goals of care. Interventional radiology was unable to place a PEG tube on 10/8. Gastroenterology also declined to place PEG tube due to high risk of an underwent inadvertently puncturing the colon while doing the procedure. Hence surgery was consulted who was contemplating putting an open PEG tube. However on 10/12 patient became more alert and awake, patient was evaluated by speech therapy and placed on dysphagia 1 diet. Surgery signed off and recommended to advance diet and there is no need of PEG tube. PANDA tube will be removed today.   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 - Patient was on Plavix 75 mg daily prior to admission, restarted Aggrenox (recommended by neurology) - Failed swallow evaluation initially, family requested a PEG tube, IR and GI unable to place G tube, surgery was planning to do open PEG tube however patient now more alert, communicative and following commands. - Case discussed with SLP, she is tolerating Dysphagia 1 diet -Anticipate discharge to SNF in the next 24 hours Active Problems:    DM type 2,  uncontrolled, with renal complications - Blood sugars controlled, changed CBGs to q. AC and HS.     CKD (chronic kidney disease), stage III - Currently stable    Hypothyroidism - Continue IV Synthroid    COPD (chronic obstructive pulmonary disease) - Currently stable   Dysphagia, pharyngoesophageal phase - Diet advance to dysphagia 1, hopefully if this can be evaluated today to upgrade diet given her improved mental status   DVT Prophylaxis:  Code Status:  Family Communication:  Spoke with her son over telephone  Disposition: Anticipate discharge to SNF in the next 24 hours  Consultants:  Neurology  Palliative care  general surgery  GI   Subjective: Much more alert and awake today, following commands, appropriately responded to questions  Objective: Weight change:   Intake/Output Summary (Last 24 hours) at 08/19/14 1621 Last data filed at 08/18/14 1850  Gross per 24 hour  Intake    120 ml  Output      0 ml  Net    120 ml   Blood pressure 126/55, pulse 79, temperature 98.5 F (36.9 C), temperature source Oral, resp. rate 16, height 5\' 3"  (1.6 m), weight 76 kg (167 lb 8.8 oz), SpO2 95.00%.  Physical Exam: General: Alert and awake NAD  CVS: S1-S2 clear, no murmur rubs or gallops Chest: CTAB Abdomen: soft NT, ND, NBS Extremities: no c/C/E   Lab Results: Basic Metabolic Panel:  Recent Labs Lab 08/18/14 0608 08/19/14 0435  NA 147 148*  K 3.7 3.6*  CL 110 110  CO2 29 28  GLUCOSE 141* 207*  BUN 18 18  CREATININE 1.08 1.14*  CALCIUM 8.3* 8.5   Liver Function Tests: No results found for this basename: AST, ALT, ALKPHOS, BILITOT, PROT, ALBUMIN,  in the last 168 hours No results found for this basename: LIPASE, AMYLASE,  in the last 168 hours No results found for this basename: AMMONIA,  in the last 168 hours CBC:  Recent Labs Lab 08/13/14 0830 08/19/14 0435  WBC 4.6 4.7  HGB 9.8* 8.9*  HCT 31.3* 27.4*  MCV 92.1 89.5  PLT 265 219    Cardiac Enzymes: No results found for this basename: CKTOTAL, CKMB, CKMBINDEX, TROPONINI,  in the last 168 hours BNP: No components found with this basename: POCBNP,  CBG:  Recent Labs Lab 08/18/14 1637 08/18/14 2004 08/19/14 0013 08/19/14 0406 08/19/14 1106  GLUCAP 255* 228* 195* 207* 228*     Micro Results: No results found for this or any previous visit (from the past 240 hour(s)).  Studies/Results: Ct Head Wo Contrast  08/06/2014   CLINICAL DATA:  Code stroke.  Facial droop.  EXAM: CT HEAD WITHOUT CONTRAST  TECHNIQUE: Contiguous axial images were obtained from the base of the skull through the vertex without intravenous contrast.  COMPARISON:  08/04/2014  FINDINGS: Diffuse cerebral atrophy. Ventricular dilatation consistent with central atrophy. Low-attenuation changes throughout the deep white matter consistent with small vessel ischemia. Old lacunar infarcts in the right caudate lobe, left thalamus, and left cerebellum. No change since previous study. No mass effect or midline shift. No abnormal extra-axial fluid collections. Gray-white matter junctions are distinct. Basal cisterns are not effaced. No evidence of acute intracranial hemorrhage. No depressed skull fractures. Small retention cyst in the sphenoid sinus. Mastoid air cells are not opacified.  IMPRESSION: No acute intracranial abnormalities. Chronic atrophy and small vessel ischemic changes with multiple old lacunar infarcts.  These results were called by telephone at the time of interpretation on 08/06/2014 at 10:52 pm to Dr. Vanetta Mulders , who verbally acknowledged these results.   Electronically Signed   By: Burman Nieves M.D.   On: 08/06/2014 22:54   Ct Head Wo Contrast  08/04/2014   CLINICAL DATA:  Facial droop and left upper extremity paresis.  EXAM: CT HEAD WITHOUT CONTRAST  TECHNIQUE: Contiguous axial images were obtained from the base of the skull through the vertex without intravenous contrast.  COMPARISON:   CT scan of July 24, 2014.  FINDINGS: Bony calvarium appears intact. Mild diffuse cortical atrophy is noted. Moderate chronic ischemic white matter disease is noted. Stable calcifications are noted in the basal ganglia bilaterally. No mass effect or midline shift is noted. Ventricular size is within normal limits. There is no evidence of mass lesion, hemorrhage or acute infarction.  IMPRESSION: Mild diffuse cortical atrophy. Moderate chronic ischemic white matter disease. No acute intracranial abnormality seen.   Electronically Signed   By: Roque Lias M.D.   On: 08/04/2014 14:02   Ct Head Wo Contrast  07/24/2014   CLINICAL DATA:  Elevated blood glucose, hypertensive, mental status change  EXAM: CT HEAD WITHOUT CONTRAST  TECHNIQUE: Contiguous axial images were obtained from the base of the skull through the vertex without intravenous contrast.  COMPARISON:  06/12/2014  FINDINGS: There is no evidence of mass effect, midline shift, or extra-axial fluid collections. There is no evidence of a space-occupying lesion or intracranial hemorrhage. There is no evidence of a cortical-based area of acute infarction. There is an old right basal ganglia and bilateral thalamic lacunar infarct. There is generalized cerebral atrophy. There is periventricular white matter low attenuation likely  secondary to microangiopathy.  The ventricles and sulci are appropriate for the patient's age. The basal cisterns are patent.  Visualized portions of the orbits are unremarkable. The visualized portions of the paranasal sinuses and mastoid air cells are unremarkable. Cerebrovascular atherosclerotic calcifications are noted.  The osseous structures are unremarkable.  IMPRESSION: No acute intracranial pathology.   Electronically Signed   By: Elige KoHetal  Patel   On: 07/24/2014 11:06   Mr Maxine GlennMra Head Wo Contrast  08/08/2014   CLINICAL DATA:  Sudden onset of inability to speak beginning yesterday. Symptoms have now resolved. Stroke risk factors  include previous stroke, diabetes, hypertension, hyperlipidemia, and chronic renal disease.  EXAM: MRI HEAD WITHOUT CONTRAST  MRA HEAD WITHOUT CONTRAST  TECHNIQUE: Multiplanar, multiecho pulse sequences of the brain and surrounding structures were obtained without intravenous contrast. Angiographic images of the head were obtained using MRA technique without contrast.  COMPARISON:  CT head 08/06/2014.  MR head 04/24/14  FINDINGS: MRI HEAD FINDINGS  Subcentimeter acute infarct in the LEFT corpus callosum just lateral and superior to the genu. This lies within the LEFT ACA territory. No other areas of acute infarction are identified. No hemorrhage, mass lesion, or extra-axial fluid.  Hydrocephalus ex vacuo. Extensive small vessel disease throughout the periventricular and subcortical white matter. Widespread remote basal ganglia, thalamic, and deep white matter infarcts some of which were acute on the previous study from June. Partial empty sella. Cervical spondylosis. No osseous lesions. BILATERAL cataract extraction. Chronic sinus and mastoid fluid.  MRA HEAD FINDINGS  Misregistration of the process images precludes accurate stenosis evaluation. There is gross patency of the internal carotid arteries and basilar artery with RIGHT vertebral dominant. Suspected moderate RIGHT cavernous ICA disease and mild LEFT cavernous ICA disease suspected mild disease of the distal LEFT vertebral. There is no proximal flow reducing lesion of the anterior, middle, or posterior cerebral arteries. Both distal anterior cerebral arteries appear patent.  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  Advanced atrophy, small vessel disease, and widespread remote deep nuclei and deep white matter infarcts.  Patient motion results in misregistration of the MRA exam. Gross patency of the intracranial vasculature, including both anterior cerebral arteries, is established however.   Electronically  Signed   By: Davonna BellingJohn  Curnes M.D.   On: 08/08/2014 09:54   Mr Brain Wo Contrast  08/08/2014   CLINICAL DATA:  Sudden onset of inability to speak beginning yesterday. Symptoms have now resolved. Stroke risk factors include previous stroke, diabetes, hypertension, hyperlipidemia, and chronic renal disease.  EXAM: MRI HEAD WITHOUT CONTRAST  MRA HEAD WITHOUT CONTRAST  TECHNIQUE: Multiplanar, multiecho pulse sequences of the brain and surrounding structures were obtained without intravenous contrast. Angiographic images of the head were obtained using MRA technique without contrast.  COMPARISON:  CT head 08/06/2014.  MR head 04/24/14  FINDINGS: MRI HEAD FINDINGS  Subcentimeter acute infarct in the LEFT corpus callosum just lateral and superior to the genu. This lies within the LEFT ACA territory. No other areas of acute infarction are identified. No hemorrhage, mass lesion, or extra-axial fluid.  Hydrocephalus ex vacuo. Extensive small vessel disease throughout the periventricular and subcortical white matter. Widespread remote basal ganglia, thalamic, and deep white matter infarcts some of which were acute on the previous study from June. Partial empty sella. Cervical spondylosis. No osseous lesions. BILATERAL cataract extraction. Chronic sinus and mastoid fluid.  MRA HEAD FINDINGS  Misregistration of the process images precludes accurate stenosis evaluation. There is gross  patency of the internal carotid arteries and basilar artery with RIGHT vertebral dominant. Suspected moderate RIGHT cavernous ICA disease and mild LEFT cavernous ICA disease suspected mild disease of the distal LEFT vertebral. There is no proximal flow reducing lesion of the anterior, middle, or posterior cerebral arteries. Both distal anterior cerebral arteries appear patent.  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  Advanced atrophy, small vessel disease, and widespread remote deep nuclei and deep white  matter infarcts.  Patient motion results in misregistration of the MRA exam. Gross patency of the intracranial vasculature, including both anterior cerebral arteries, is established however.   Electronically Signed   By: Davonna Belling M.D.   On: 08/08/2014 09:54   Dg Chest Port 1 View  08/07/2014   CLINICAL DATA:  Short of breath. Labored in shallow breathing. Unresponsive.  EXAM: PORTABLE CHEST - 1 VIEW  COMPARISON:  07/29/2014  FINDINGS: Probable linear atelectasis in the left mid lung. Normal heart size and pulmonary vascularity. No focal airspace disease in the lungs. No blunting of costophrenic angles. No pneumothorax. Mediastinal contours appear intact.  IMPRESSION: No active disease.   Electronically Signed   By: Burman Nieves M.D.   On: 08/07/2014 00:02   Dg Chest Port 1 View  07/29/2014   CLINICAL DATA:  Intubation.  EXAM: PORTABLE CHEST - 1 VIEW  COMPARISON:  07/28/2014.  FINDINGS: Endotracheal tube 1.9 cm above the carina. Left IJ line in NG tube in stable position. Cardiac monitor stable position. Heart size is stable. Pulmonary vascularity is normal. Bibasilar atelectasis and/or infiltrates are present. Small bilateral pleural effusions cannot be excluded. A mild component congestive heart failure cannot be excluded. No pneumothorax. No acute osseus abnormality.  IMPRESSION: 1. Interval reposition of endotracheal tube. Its tip is now 1.9 cm above the carina left IJ line, and NG tube in stable position. 2. Interval development of bibasilar infiltrates and possible tiny pleural effusions. Findings suggest mild CHF. Bibasilar pneumonia cannot be excluded.   Electronically Signed   By: Maisie Fus  Register   On: 07/29/2014 07:45   Dg Chest Portable 1 View  07/28/2014   CLINICAL DATA:  Endotracheal tube and orogastric tube placement  EXAM: PORTABLE CHEST - 1 VIEW  COMPARISON:  07/27/2014  FINDINGS: Endotracheal tube is seen with tip directly at the carina directed toward left main bronchus. Left  internal jugular central line is seen with tip over the junction of the brachiocephalic vein and superior vena cava. There is no pneumothorax. NG tube crosses the gastroesophageal junction.  Right lung is clear. Partial consolidation left lower lobe most consistent with partial atelectasis, improved when compared to the prior study. When compared to prior study there is improved aeration in the left lower lobe.  IMPRESSION: Endotracheal tube at the carina. Consider retracting the tube 2-3 cm and repeating the image. Continued atelectasis left lower lobe, but improved when compared to prior study. These results were called by telephone at the time of interpretation on 07/28/2014 at 2:23 am to Joy, the patient's charge nurse, who verbally acknowledged these results.   Electronically Signed   By: Esperanza Heir M.D.   On: 07/28/2014 02:23   Dg Chest Port 1 View  07/27/2014   CLINICAL DATA:  Pneumonitis.  EXAM: PORTABLE CHEST - 1 VIEW  COMPARISON:  07/26/2014.  FINDINGS: Endotracheal tube ends between the clavicular heads and carina. Left IJ catheter is likely stable in positioning, although there is new distortion from leftward rotation. Orogastric  tube enters the stomach at least.  There is no change in heart size and mediastinal contours when accounting for rotation. Implantable cardiac monitor has a stable orientation.  Worsening basilar lung aeration. Hazy appearance favors atelectasis and possibly small pleural effusion. There is no edema or pneumothorax.  IMPRESSION: 1. Stable positioning of tubes and central line. 2. Significant improvement and left upper lobe aeration. Rapid clearing favors pneumonitis or atelectasis over pneumonia. 3. New bibasilar atelectasis.   Electronically Signed   By: Tiburcio Pea M.D.   On: 07/27/2014 06:25   Dg Chest Port 1 View  07/26/2014   CLINICAL DATA:  Ventilator dependent respiratory failure. Followup left upper lobe pneumonia.  EXAM: PORTABLE CHEST - 1 VIEW   COMPARISON:  Portable chest x-rays yesterday, 07/24/2014, 06/13/2014.  FINDINGS: Endotracheal tube tip in satisfactory position projecting approximately 4-5 cm above the carina. Nasogastric tube courses below the diaphragm into the stomach. Left jugular sent venous catheter tip projects over the upper SVC, unchanged.  Cardiac silhouette upper normal in size to slightly enlarged but stable. Airspace consolidation in the left upper lobe, slightly worse than on yesterday's examination. No new pulmonary parenchymal abnormalities.  IMPRESSION: Support apparatus satisfactory. Slight worsening of left upper lobe pneumonia since yesterday. No new abnormalities.   Electronically Signed   By: Hulan Saas M.D.   On: 07/26/2014 09:10   Portable Chest Xray In Am  07/25/2014   CLINICAL DATA:  Follow-up endotracheal tube position.  EXAM: PORTABLE CHEST - 1 VIEW  COMPARISON:  07/24/2014  FINDINGS: Endotracheal tube remains in place with tip approximately 2-2.5 cm above the carina. Left jugular central venous catheter is unchanged with tip near the brachiocephalic vein confluence. Enteric tube courses into the left upper abdomen. Loop recorder is again seen. Cardiac silhouette is within normal limits for size. Left perihilar opacity has decreased from the prior study. Right lung remains clear. No definite pleural effusion or pneumothorax is identified.  IMPRESSION: 1. Endotracheal tube 2-2.5 cm above the carina. 2. Improved left upper lobe aeration.   Electronically Signed   By: Sebastian Ache   On: 07/25/2014 09:13   Portable Chest Xray  07/24/2014   CLINICAL DATA:  Interval intubation of the patient ; assess support tube and line. Placement  EXAM: PORTABLE CHEST - 1 VIEW  COMPARISON:  Portable chest x-ray of today's date.  FINDINGS: The endotracheal tube tip lies 2 cm above the crotch of the carina. The right internal jugular venous catheter tip lies at the level of the junction of the right and left brachiocephalic  veins. The esophagogastric tube tip projects below the inferior margin of the image. A loop recorder is visible over the left atrial region. The cardiac silhouette is normal in size. The pulmonary vascularity is not engorged. There is confluent alveolar opacity in the left perihilar region that is more conspicuous than on the earlier study. The bony thorax exhibits no acute abnormality.  IMPRESSION: 1. The endotracheal tube tip lies 2.1 cm above the crotch of the carina. Withdrawal by 2-3 cm is recommended to avoid accidental mainstem bronchus intubation. 2. The left internal jugular venous catheter and the esophagogastric tube are in appropriate position radiographically. There is no postprocedure pneumothorax or hemothorax. 3. There is increased alveolar opacity in the low left perihilar region consistent with pneumonia. 4. These results were called by telephone at the time of interpretation on 07/24/2014 at 7:21 pm to [facility], RN, who verbally acknowledged these results.   Electronically Signed  By: David  [place]   On: 07/24/2014 19:26   Dg Chest Port 1 View  07/24/2014   CLINICAL DATA:  Recent aspiration  EXAM: PORTABLE CHEST - 1 VIEW  COMPARISON:  06/13/2014  FINDINGS: The cardiac shadow is stable. Monitoring device is again seen. The right lung is clear. The left lung demonstrates patchy infiltrate in the upper lobe which may be related to the patient's recent aspiration.  IMPRESSION: Left upper lobe infiltrate. Followup films to resolution are recommended.   Electronically Signed   By: Alcide Clever M.D.   On: 07/24/2014 11:09   Dg Abd Portable 1v  07/30/2014   CLINICAL DATA:  Feeding tube placement  EXAM: PORTABLE ABDOMEN - 1 VIEW  COMPARISON:  Portable exam 1642 hr compared to 07/28/2014  FINDINGS: Feeding tube coiled at mid stomach with tip reflected to fundus.  Nonobstructive bowel gas pattern.  Minimal atelectasis and questionable fusions at lung bases.  Loop recorder projects over chest.   IMPRESSION: Feeding tube coiled at mid to proximal stomach.   Electronically Signed   By: Ulyses Southward M.D.   On: 07/30/2014 16:55   Dg Abd Portable 1v  07/28/2014   CLINICAL DATA:  Endotracheal tube and orogastric tube placement  EXAM: PORTABLE ABDOMEN - 1 VIEW  COMPARISON:  None.  FINDINGS: Orogastric tube crosses the gastroesophageal junction with tip in the left upper quadrant in the anticipated position of stomach. Endotracheal tube is also seen on this study with tip about 12 mm above the carinal.  IMPRESSION: Endotracheal tube and OG tube as described.   Electronically Signed   By: Esperanza Heir M.D.   On: 07/28/2014 02:24   Dg Swallowing Func-speech Pathology  08/01/2014   Riley Nearing Deblois, CCC-SLP     08/01/2014  1:43 PM Objective Swallowing Evaluation: Modified Barium Swallowing Study   Patient Details  Name: HANNAN TETZLAFF MRN: [id number] Date of Birth: [birth year]  Today's Date: 08/01/2014 Time: 1300-1330 SLP Time Calculation (min): 30 min  Past Medical History:  Past Medical History  Diagnosis Date  . Hypertension   . Diabetes mellitus   . Chronic renal insufficiency   . Hyperlipidemia   . Osteopenia   . CVA (cerebral infarction)   . Thyroid disease    Past Surgical History:  Past Surgical History  Procedure Laterality Date  . Eye surgery    . Loop recorder implant  03-24-2014    MDT LinQ implanted by Dr Johney Frame for cryptogenic stroke  . Tee without cardioversion N/A 03/23/2014    Procedure: TRANSESOPHAGEAL ECHOCARDIOGRAM (TEE);  Surgeon:  Lars Masson, MD;  Location: [facility] ENDOSCOPY;  Service:  Cardiovascular;  Laterality: N/A;   HPI:  [name] with COPD, CVA, CK D., Diabetes admitted 9/18  with acute respiratory distress , hypercarbic respiratory  failure, DKA with glucose of 802 and anion gap of 19 , requiring  mechanical ventilation. Chest x-ray showed left upper lobe  infiltrate. Was placed on BiPAP initially, but unresponsive on  arrival to the ICU, hence intubated. She had recent  admit from  8/6 to 814 for hypercarbic failure that improved with BiPAP-dc'd  to NH , then home since 9/14. 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 Patient has been seen by SLP  during previous admissions and has been observed with a delayed  oral phase and suspected delayed swallow initiation with s/s of  aspiration with thin liquids via straw. Most frequent diet  recommendations for dysphagia 1 solids, thin liquids.  Assessment / Plan / Recommendation Clinical Impression  Dysphagia Diagnosis: Moderate oral phase dysphagia;Mild  pharyngeal phase dysphagia Clinical impression: Pt presents with a primary cognitive based,  oral dyspahgia with lingual pumping and piecemeal transit of PO,  leading to multiple swallows per bolus. Swallow response is only  slightly delayed but mild base of tongue weakness results in  trace vallecular residuals and very trace penetration after the  swallow with nectar thick liquids. Pt senses penetrate and expels  with a soft throat clear. Given overt aspiration of thin at  bedside and pts AMS, thin liquids not tested though expect pt  will be able to upgrade as mentation improves. Pt to initiate dys  1 (puree) diet and nectar thick liquids. SLP will follow for  tolerance.      Treatment Recommendation  Therapy as outlined in treatment plan below    Diet Recommendation Dysphagia 1 (Puree);Nectar-thick liquid   Liquid Administration via: Cup;Straw Medication Administration: Whole meds with puree Supervision: Staff to assist with self feeding Compensations: Slow rate;Small sips/bites Postural Changes and/or Swallow Maneuvers: Seated upright 90  degrees;Upright 30-60 min after meal    Other  Recommendations Oral Care Recommendations: Oral care BID Other Recommendations: Order thickener from pharmacy   Follow Up Recommendations  Skilled Nursing facility    Frequency and Duration min 2x/week  2 weeks   Pertinent Vitals/Pain NA    SLP Swallow Goals     General HPI:  [name] with COPD, CVA, CK D., Diabetes  admitted 9/18 with acute respiratory distress , hypercarbic  respiratory failure, DKA with glucose of 802 and anion gap of 19  , requiring mechanical ventilation. Chest x-ray showed left upper  lobe infiltrate. Was placed on BiPAP initially, but unresponsive  on arrival to the ICU, hence intubated. She had recent admit from  8/6 to 814 for hypercarbic failure that improved with BiPAP-dc'd  to NH , then home since 9/14. 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 Patient has been seen by SLP  during previous admissions and has been observed with a delayed  oral phase and suspected delayed swallow initiation with s/s of  aspiration with thin liquids via straw. Most frequent diet  recommendations for dysphagia 1 solids, thin liquids.  Type of Study: Modified Barium Swallowing Study Reason for Referral: Objectively evaluate swallowing function Previous Swallow Assessment: see HPI Diet Prior to this Study: NPO Temperature Spikes Noted: No Respiratory Status: Room air History of Recent Intubation: Yes Length of Intubations (days): 2 days Date extubated: 07/29/14 Behavior/Cognition: Alert;Cooperative;Requires cueing Oral Cavity - Dentition: Edentulous Oral Motor / Sensory Function: Within functional limits Self-Feeding Abilities: Total assist Patient Positioning: Upright in chair Baseline Vocal Quality: Clear Volitional Cough: Weak Volitional Swallow: Able to elicit Pharyngeal Secretions: Not observed secondary MBS    Reason for Referral Objectively evaluate swallowing function   Oral Phase Oral Preparation/Oral Phase Oral Phase: Impaired Oral - Nectar Oral - Nectar Cup: Lingual pumping;Reduced posterior  propulsion;Piecemeal swallowing;Delayed oral transit Oral - Nectar Straw: Lingual pumping;Reduced posterior  propulsion;Piecemeal swallowing;Delayed oral transit Oral - Solids Oral - Puree: Lingual pumping;Reduced posterior  propulsion;Piecemeal  swallowing;Delayed oral transit   Pharyngeal Phase Pharyngeal Phase Pharyngeal Phase: Impaired Pharyngeal - Nectar Pharyngeal - Nectar Cup: Reduced tongue base  retraction;Penetration/Aspiration after swallow;Pharyngeal  residue - valleculae Penetration/Aspiration details (nectar cup): Material enters  airway, CONTACTS cords then ejected out;Material does not enter  airway Pharyngeal - Nectar Straw: Reduced tongue base  retraction;Penetration/Aspiration after swallow;Pharyngeal  residue -  valleculae Penetration/Aspiration details (nectar straw): Material does not  enter airway;Material enters airway, CONTACTS cords then ejected  out Pharyngeal - Solids Pharyngeal - Puree: Reduced tongue base retraction;Pharyngeal  residue - valleculae Penetration/Aspiration details (puree): Material does not enter  airway  Cervical Esophageal Phase    GO    Cervical Esophageal Phase Cervical Esophageal Phase: Impaired Cervical Esophageal Phase - Comment Cervical Esophageal Comment: Appearance of slow transit, stasis  through GE junction        [employer], MA CCC-SLP [phone]  DeBlois, Riley Nearing 08/01/2014, 1:42 PM     Medications: Scheduled Meds: . antiseptic oral rinse  7 mL Mouth Rinse q12n4p  . atorvastatin  40 mg Oral Daily  . budesonide-formoterol  2 puff Inhalation BID  . chlorhexidine  15 mL Mouth Rinse BID  . cloNIDine  0.3 mg Transdermal Weekly  . dipyridamole-aspirin  1 capsule Oral BID  . feeding supplement (ENSURE)  1 Container Oral BID BM  . heparin  5,000 Units Subcutaneous 3 times per day  . insulin aspart  0-9 Units Subcutaneous TID WC  . insulin glargine  12 Units Subcutaneous Daily  . isosorbide dinitrate  10 mg Oral TID  . levothyroxine  50 mcg Oral QAC breakfast    Time Spent: 25 min   LOS: 13 days   Jeralyn Bennett M.D. [facility] 08/19/2014, 4:21 PM Pager: [phone]  If 7PM-7AM, please contact night-coverage [email or web address] Password [email or web address]

## 2014-08-19 NOTE — Progress Notes (Signed)
Speech Language Pathology Treatment: Dysphagia  Patient Details Name: [name] [name] MRN: [id number] DOB: 15-Jul-1941 Today's Date: 08/19/2014 Time: [id number] SLP Time Calculation (min): 8 min  Assessment / Plan / Recommendation Clinical Impression  SLP observed pt with nectar thick liquids given diet order written for nectar originally rather than honey. Pt has been tolerating liquids well despite having a thinner texture than recommended. Observed with this texture today with no evidence of aspiration. Pt continues to have an oral dysphagia causing pumping and holding with puddings and purees, particularly with meds. Despite this she is consuming meals and tolerating. She will need f/u with SLP at next level of care.    HPI HPI: [name] with COPD, CVA, CK D., Diabetes admitted from SNF after refusing her medication, was nonverbal, brought in as a code stroke.  [facility] 08[facility]/14 due to respiratory distress.  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     Pertinent Vitals Pain Assessment: No/denies pain  SLP Plan  Continue with current plan of care    Recommendations Diet recommendations: Dysphagia 1 (puree);Nectar-thick liquid Liquids provided via: Teaspoon Medication Administration: Via alternative means Supervision: Staff to assist with self feeding;Full supervision/cueing for compensatory strategies Compensations: Slow rate;Small sips/bites Postural Changes and/or Swallow Maneuvers: Seated upright 90 degrees;Upright 30-60 min after meal              Oral Care Recommendations: Oral care BID Follow up Recommendations: Skilled Nursing facility Plan: Continue with current plan of care    GO    [facility] Endoscopy CenterBonnie Shirl Weir, MA CCC-SLP [phone]  Claudine MoutonDeBlois, Kandi Brusseau Caroline 08/19/2014, 11:06 AM

## 2014-08-19 NOTE — Progress Notes (Signed)
Inpatient Diabetes Program Recommendations  AACE/ADA: New Consensus Statement on Inpatient Glycemic Control (2013)  Target Ranges:  Prepandial:   less than 140 mg/dL      Peak postprandial:   less than 180 mg/dL (1-2 hours)      Critically ill patients:  140 - 180 mg/dL   Reason for Assessment:  Results for Erin FullingVANS, Deanie J (MRN [id number]) as of 08/19/2014 10:36  Ref. Range 08/18/2014 16:37 08/18/2014 20:04 08/19/2014 00:13 08/19/2014 04:06  Glucose-Capillary Latest Range: 70-99 mg/dL 409255 (H) 811228 (H) 914195 (H) 207 (H)     Inpatient Diabetes Program Recommendations Insulin - Basal: Increase Lantus to 15 units   Note: May also consider increasing Novolog correction to moderate tid with meals.  Thanks, Beryl MeagerJenny Tzipora Mcinroy, RN, BC-ADM Inpatient Diabetes Coordinator Pager [phone]

## 2014-08-20 DIAGNOSIS — G9341 Metabolic encephalopathy: Secondary | ICD-10-CM

## 2014-08-20 LAB — GLUCOSE, CAPILLARY
Glucose-Capillary: 158 mg/dL — ABNORMAL HIGH (ref 70–99)
Glucose-Capillary: 173 mg/dL — ABNORMAL HIGH (ref 70–99)
Glucose-Capillary: 177 mg/dL — ABNORMAL HIGH (ref 70–99)
Glucose-Capillary: 210 mg/dL — ABNORMAL HIGH (ref 70–99)

## 2014-08-20 LAB — BASIC METABOLIC PANEL
Anion gap: 9 (ref 5–15)
BUN: 14 mg/dL (ref 6–23)
CO2: 28 mEq/L (ref 19–32)
CREATININE: 1.14 mg/dL — AB (ref 0.50–1.10)
Calcium: 8.7 mg/dL (ref 8.4–10.5)
Chloride: 112 mEq/L (ref 96–112)
GFR calc Af Amer: 53 mL/min — ABNORMAL LOW (ref >90)
GFR, EST NON AFRICAN AMERICAN: 46 mL/min — AB (ref >90)
GLUCOSE: 172 mg/dL — AB (ref 70–99)
POTASSIUM: 3.7 meq/L (ref 3.7–5.3)
Sodium: 149 mEq/L — ABNORMAL HIGH (ref 137–147)

## 2014-08-20 MED ORDER — ASPIRIN-DIPYRIDAMOLE ER 25-200 MG PO CP12: 1.0000 | ORAL_CAPSULE | Freq: Two times a day (BID) | ORAL | Status: AC

## 2014-08-20 MED ORDER — SENNOSIDES-DOCUSATE SODIUM 8.6-50 MG PO TABS: 1.0000 | ORAL_TABLET | Freq: Every evening | ORAL | Status: AC | PRN

## 2014-08-20 NOTE — Discharge Summary (Signed)
Physician Discharge Summary  [name] [name]:[id number] DOB: [birth year] DOA: 08/06/2014  PCP: Salena Saner., MD  Admit date: 08/06/2014 Discharge date: 08/20/2014  Time spent: 35 minutes  Recommendations for Outpatient Follow-up:  1. Please follow up on CBG's and blood pressures 2. Repeat BMP and CBC in 1 week 3. Patient to be discharged to [facility]  Discharge Diagnoses:  Principal Problem:   CVA (cerebral infarction) Active Problems:   History of CVA (cerebrovascular accident)   DM type 2, uncontrolled, with renal complications   CKD (chronic kidney disease), stage III   Acute CVA (cerebrovascular accident)   Hypothyroidism   Diabetic neuropathy   COPD (chronic obstructive pulmonary disease)   Aphasia   Palliative care encounter   DNR (do not resuscitate) discussion   Dysphagia, pharyngoesophageal phase   Discharge Condition: Stable  Diet recommendation: Dysphagia 1 (puree); honey thick liquid with direct supervision  Filed Weights   08/06/14 2248 08/07/14 0217 08/08/14 0358  Weight: 77.111 kg (170 lb) 75.6 kg (166 lb 10.7 oz) 76 kg (167 lb 8.8 oz)    History of present illness:  [name] [name] is a 73 y.o. female with Past medical history of CVA, hypertension, diabetes mellitus, hypothyroidism, recent admission for respiratory failure requiring intubation.  The patient is presenting with complaints of inability to speak which was noticed at around 5:30 PM. The history is obtained from ED documentation since the patient is unable to provide any significant history. The patient's primary complaint was that she has been getting too much of medications.  Patient was refusing her medication and was nonverbal and therefore she was brought in as a code stroke. Neurology evaluated the patient and canceled the cord stroke and recommended medicine admission.  The patient is coming from [facility] . And at her baseline dependent for most of her ADL.  Hospital  Course:  Patient is a 74 year old with prior CVAs, hypertension, DM, hypothyroidism who presented to the hospital secondary to inability to speak. She is a current resident at [facility]. MRI of brain performed on 08/08/2014  showed a large acute left corpus callosum infarct. The patient has failed bedside swallow evaluation and after discussion with POA, palliative meeting, requested PEG tube placement as part of their goals of care. Interventional radiology was unable to place a PEG tube on 10/8. Gastroenterology also declined to place PEG tube due to high risk of an underwent inadvertently puncturing the colon while doing the procedure. Hence surgery was consulted who was contemplating putting an open PEG tube. However on 10/12 patient became more alert and awake, patient was evaluated by speech therapy and placed on dysphagia 1 diet. Surgery signed off and recommended to advance diet and there is no need of PEG tube.  CVA (cerebral infarction)- aphasic intermittently getting more alert, today able to follow cues with speech therapy  - MRI Acute LEFT corpus callosum infarct just superior and lateral to the genu  - MRA no large vessel occlusion  - Carotid Dopplers showed no significance stenosis, 2-D echo unremarkable  - TEE in 03/2014 showed PFO, so DVT screening done which was negative.  - Patient was on Plavix 75 mg daily prior to admission, restarted Aggrenox (recommended by neurology)  - Failed swallow evaluation initially, family requested a PEG tube, IR and GI unable to place G tube, surgery was planning to do open PEG tube however patient now more alert, communicative and following commands.  - Case discussed with SLP, she is tolerating  Dysphagia 1 diet  -Discharged back to [facility] on 08/20/2014 Active Problems:  DM type 2, uncontrolled, with renal complications  - Blood sugars controlled, changed CBGs to q. AC and HS.  -Discharged on her home dose of lantus at 13 units [place] q  daily CKD (chronic kidney disease), stage III  - Stable, and creatinine 1.14 on 08/20/2014 Hypothyroidism  - Will discharge on her home dose of Synthroid 50 mcg by mouth daily  COPD (chronic obstructive pulmonary disease)  - Currently stable  Dysphagia, pharyngoesophageal phase  - Case discussed with speech therapy, patient tolerating dysphagia 1 diet fairly well.    Consultations: Neurology  Palliative care  General Surgery  GI   Discharge Exam: Filed Vitals:   08/20/14 1009  BP: 158/54  Pulse: 86  Temp: 98.1 F (36.7 C)  Resp: 18    General: Alert and awake NAD, following commands CVS: S1-S2 clear, no murmur rubs or gallops  Chest: CTAB  Abdomen: soft NT, ND, NBS  Extremities: no c/C/E   Discharge Instructions You were cared for by a hospitalist during your hospital stay. If you have any questions about your discharge medications or the care you received while you were in the hospital after you are discharged, you can call the unit and asked to speak with the hospitalist on call if the hospitalist that took care of you is not available. Once you are discharged, your primary care physician will handle any further medical issues. Please note that NO REFILLS for any discharge medications will be authorized once you are discharged, as it is imperative that you return to your primary care physician (or establish a relationship with a primary care physician if you do not have one) for your aftercare needs so that they can reassess your need for medications and monitor your lab values.  Discharge Instructions   Call MD for:  difficulty breathing, headache or visual disturbances    Complete by:  As directed      Call MD for:  extreme fatigue    Complete by:  As directed      Call MD for:  hives    Complete by:  As directed      Call MD for:  persistant dizziness or light-headedness    Complete by:  As directed      Call MD for:  persistant nausea and vomiting    Complete by:   As directed      Call MD for:  redness, tenderness, or signs of infection (pain, swelling, redness, odor or green/yellow discharge around incision site)    Complete by:  As directed      Call MD for:  severe uncontrolled pain    Complete by:  As directed      Call MD for:  temperature >100.4    Complete by:  As directed      Diet - low sodium heart healthy    Complete by:  As directed      Increase activity slowly    Complete by:  As directed           Current Discharge Medication List    START taking these medications   Details  dipyridamole-aspirin (AGGRENOX) 200-25 MG per 12 hr capsule Take 1 capsule by mouth 2 (two) times daily. Qty: 60 capsule, Refills: 1    senna-docusate (SENOKOT-S) 8.6-50 MG per tablet Take 1 tablet by mouth at bedtime as needed for mild constipation. Qty: 30 tablet, Refills: 1  CONTINUE these medications which have NOT CHANGED   Details  acetaminophen (TYLENOL) 325 MG tablet Take 650 mg by mouth every 6 (six) hours as needed for mild pain.    albuterol (PROVENTIL) (2.5 MG/3ML) 0.083% nebulizer solution Take 2.5 mg by nebulization every 6 (six) hours as needed for wheezing or shortness of breath.    atorvastatin (LIPITOR) 40 MG tablet Take 40 mg by mouth daily.    budesonide-formoterol (SYMBICORT) 80-4.5 MCG/ACT inhaler Inhale 2 puffs into the lungs 2 (two) times daily.    carvedilol (COREG) 25 MG tablet Take 25 mg by mouth 2 (two) times daily with a meal.    cloNIDine (CATAPRES - DOSED IN MG/24 HR) 0.3 mg/24hr patch Place 0.3 mg onto the skin once a week.    hydrALAZINE (APRESOLINE) 100 MG tablet Take 100 mg by mouth 3 (three) times daily.    insulin aspart (NOVOLOG) 100 UNIT/ML injection Inject 0-15 Units into the skin 3 (three) times daily before meals. *70-120=0 units, 121-150=3 units, 151-200=4 units, 201-250=7 units, 251-300=11 units, 301-350=15 units, greater than 400 call MD*    insulin glargine (LANTUS) 100 UNIT/ML injection Inject 13  Units into the skin at bedtime.    isosorbide dinitrate (ISORDIL) 10 MG tablet Take 10 mg by mouth 3 (three) times daily.    levothyroxine (SYNTHROID, LEVOTHROID) 50 MCG tablet Take 50 mcg by mouth daily before breakfast.      STOP taking these medications     cloNIDine (CATAPRES) 0.1 MG tablet      clopidogrel (PLAVIX) 75 MG tablet        Allergies  Allergen Reactions  . Erythromycin Shortness Of Breath  . Iodine Hives  . Penicillins Hives  . Latex Hives and Itching      The results of significant diagnostics from this hospitalization (including imaging, microbiology, ancillary and laboratory) are listed below for reference.    Significant Diagnostic Studies: Ct Abdomen Wo Contrast  2014-09-01   CLINICAL DATA:  Evaluate anatomy prior to potential percutaneous gastrostomy tube placement.  EXAM: CT ABDOMEN WITHOUT CONTRAST  TECHNIQUE: Multidetector CT imaging of the abdomen was performed following the standard protocol without IV contrast.  COMPARISON:  10/27/2010; abdominal radiograph - 07/30/2014  FINDINGS: The lack of intravenous contrast limits the ability to evaluate solid abdominal organs.  A minimal amount of enteric contrast is seen within the colon. The splenic flexure of the colon is interposed between the anterior abdominal wall and the ventral wall of the stomach (representative coronal image 12, series 206, axial image 29, series 21).  The bowel is normal in course and caliber without wall thickening. Normal noncontrast appearance of the appendix. No pneumoperitoneum, pneumatosis or portal venous gas.  Normal hepatic contour. There are multiple radiopaque gallstones lying dependently within the gallbladder.  Normal noncontrast appearance of the bilateral kidneys. No radiopaque gallstones. There is a minimal amount of grossly symmetric likely age and body habitus related perinephric stranding. No urinary obstruction. There is mild diffuse thickening of the left adrenal gland  without discrete nodule. Normal noncontrast appearance of the right adrenal gland, pancreas and spleen.  Atherosclerotic plaque within a normal caliber abdominal aorta. No bulky retroperitoneal or mesenteric lymphadenopathy on this noncontrast examination.  Limited visualization of the lower thorax demonstrates trace bilateral effusions with associated bibasilar opacities, likely atelectasis.  Borderline cardiomegaly. Coronary artery calcifications. No pericardial effusion.  No acute or aggressive osseus abnormalities. There is mild multilevel DDD within the lower lumbar spine thoracic spine. There is mild (approximately 7  mm) of anterolisthesis of L4 upon L5 without associated pars defect. Bilateral facet degenerative change in the lower lumbar spine.  There is diffuse body wall anasarca.  IMPRESSION: 1. The transverse colon overlies the stomach and as such, would recommend either enteric administration of barium versus a retrograde enema prior to consideration of attempted percutaneous gastrostomy tube placement. 2. Small bilateral effusions with body wall anasarca - constellation of findings worrisome for pulmonary edema.   Electronically Signed   By: Sandi Mariscal M.D.   On: 08/12/2014 21:01   Ct Head Wo Contrast  08/06/2014   CLINICAL DATA:  Code stroke.  Facial droop.  EXAM: CT HEAD WITHOUT CONTRAST  TECHNIQUE: Contiguous axial images were obtained from the base of the skull through the vertex without intravenous contrast.  COMPARISON:  08/04/2014  FINDINGS: Diffuse cerebral atrophy. Ventricular dilatation consistent with central atrophy. Low-attenuation changes throughout the deep white matter consistent with small vessel ischemia. Old lacunar infarcts in the right caudate lobe, left thalamus, and left cerebellum. No change since previous study. No mass effect or midline shift. No abnormal extra-axial fluid collections. Gray-white matter junctions are distinct. Basal cisterns are not effaced. No evidence of  acute intracranial hemorrhage. No depressed skull fractures. Small retention cyst in the sphenoid sinus. Mastoid air cells are not opacified.  IMPRESSION: No acute intracranial abnormalities. Chronic atrophy and small vessel ischemic changes with multiple old lacunar infarcts.  These results were called by telephone at the time of interpretation on 08/06/2014 at 10:52 pm to Dr. Fredia Sorrow , who verbally acknowledged these results.   Electronically Signed   By: Lucienne Capers M.D.   On: 08/06/2014 22:54   Ct Head Wo Contrast  08/04/2014   CLINICAL DATA:  Facial droop and left upper extremity paresis.  EXAM: CT HEAD WITHOUT CONTRAST  TECHNIQUE: Contiguous axial images were obtained from the base of the skull through the vertex without intravenous contrast.  COMPARISON:  CT scan of July 24, 2014.  FINDINGS: Bony calvarium appears intact. Mild diffuse cortical atrophy is noted. Moderate chronic ischemic white matter disease is noted. Stable calcifications are noted in the basal ganglia bilaterally. No mass effect or midline shift is noted. Ventricular size is within normal limits. There is no evidence of mass lesion, hemorrhage or acute infarction.  IMPRESSION: Mild diffuse cortical atrophy. Moderate chronic ischemic white matter disease. No acute intracranial abnormality seen.   Electronically Signed   By: Sabino Dick M.D.   On: 08/04/2014 14:02   Ct Head Wo Contrast  07/24/2014   CLINICAL DATA:  Elevated blood glucose, hypertensive, mental status change  EXAM: CT HEAD WITHOUT CONTRAST  TECHNIQUE: Contiguous axial images were obtained from the base of the skull through the vertex without intravenous contrast.  COMPARISON:  06/12/2014  FINDINGS: There is no evidence of mass effect, midline shift, or extra-axial fluid collections. There is no evidence of a space-occupying lesion or intracranial hemorrhage. There is no evidence of a cortical-based area of acute infarction. There is an old right basal  ganglia and bilateral thalamic lacunar infarct. There is generalized cerebral atrophy. There is periventricular white matter low attenuation likely secondary to microangiopathy.  The ventricles and sulci are appropriate for the patient's age. The basal cisterns are patent.  Visualized portions of the orbits are unremarkable. The visualized portions of the paranasal sinuses and mastoid air cells are unremarkable. Cerebrovascular atherosclerotic calcifications are noted.  The osseous structures are unremarkable.  IMPRESSION: No acute intracranial pathology.   Electronically Signed  By: Kathreen Devoid   On: 07/24/2014 11:06   Mr Jodene Nam Head Wo Contrast  08/08/2014   CLINICAL DATA:  Sudden onset of inability to speak beginning yesterday. Symptoms have now resolved. Stroke risk factors include previous stroke, diabetes, hypertension, hyperlipidemia, and chronic renal disease.  EXAM: MRI HEAD WITHOUT CONTRAST  MRA HEAD WITHOUT CONTRAST  TECHNIQUE: Multiplanar, multiecho pulse sequences of the brain and surrounding structures were obtained without intravenous contrast. Angiographic images of the head were obtained using MRA technique without contrast.  COMPARISON:  CT head 08/06/2014.  MR head 04/24/14  FINDINGS: MRI HEAD FINDINGS  Subcentimeter acute infarct in the LEFT corpus callosum just lateral and superior to the genu. This lies within the LEFT ACA territory. No other areas of acute infarction are identified. No hemorrhage, mass lesion, or extra-axial fluid.  Hydrocephalus ex vacuo. Extensive small vessel disease throughout the periventricular and subcortical white matter. Widespread remote basal ganglia, thalamic, and deep white matter infarcts some of which were acute on the previous study from June. Partial empty sella. Cervical spondylosis. No osseous lesions. BILATERAL cataract extraction. Chronic sinus and mastoid fluid.  MRA HEAD FINDINGS  Misregistration of the process images precludes accurate stenosis  evaluation. There is gross patency of the internal carotid arteries and basilar artery with RIGHT vertebral dominant. Suspected moderate RIGHT cavernous ICA disease and mild LEFT cavernous ICA disease suspected mild disease of the distal LEFT vertebral. There is no proximal flow reducing lesion of the anterior, middle, or posterior cerebral arteries. Both distal anterior cerebral arteries appear patent.  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  Advanced atrophy, small vessel disease, and widespread remote deep nuclei and deep white matter infarcts.  Patient motion results in misregistration of the MRA exam. Gross patency of the intracranial vasculature, including both anterior cerebral arteries, is established however.   Electronically Signed   By: Rolla Flatten M.D.   On: 08/08/2014 09:54   Mr Brain Wo Contrast  08/08/2014   CLINICAL DATA:  Sudden onset of inability to speak beginning yesterday. Symptoms have now resolved. Stroke risk factors include previous stroke, diabetes, hypertension, hyperlipidemia, and chronic renal disease.  EXAM: MRI HEAD WITHOUT CONTRAST  MRA HEAD WITHOUT CONTRAST  TECHNIQUE: Multiplanar, multiecho pulse sequences of the brain and surrounding structures were obtained without intravenous contrast. Angiographic images of the head were obtained using MRA technique without contrast.  COMPARISON:  CT head 08/06/2014.  MR head 04/24/14  FINDINGS: MRI HEAD FINDINGS  Subcentimeter acute infarct in the LEFT corpus callosum just lateral and superior to the genu. This lies within the LEFT ACA territory. No other areas of acute infarction are identified. No hemorrhage, mass lesion, or extra-axial fluid.  Hydrocephalus ex vacuo. Extensive small vessel disease throughout the periventricular and subcortical white matter. Widespread remote basal ganglia, thalamic, and deep white matter infarcts some of which were acute on the previous study from June.  Partial empty sella. Cervical spondylosis. No osseous lesions. BILATERAL cataract extraction. Chronic sinus and mastoid fluid.  MRA HEAD FINDINGS  Misregistration of the process images precludes accurate stenosis evaluation. There is gross patency of the internal carotid arteries and basilar artery with RIGHT vertebral dominant. Suspected moderate RIGHT cavernous ICA disease and mild LEFT cavernous ICA disease suspected mild disease of the distal LEFT vertebral. There is no proximal flow reducing lesion of the anterior, middle, or posterior cerebral arteries. Both distal anterior cerebral arteries appear patent.  IMPRESSION: Acute LEFT corpus callosum infarct just  superior and lateral to the genu.  Advanced atrophy, small vessel disease, and widespread remote deep nuclei and deep white matter infarcts.  Patient motion results in misregistration of the MRA exam. Gross patency of the intracranial vasculature, including both anterior cerebral arteries, is established however.   Electronically Signed   By: Rolla Flatten M.D.   On: 08/08/2014 09:54   Ir Fluoro Guide Cv Line Right  08/13/2014   INDICATION: Poor venous access. In need of intravenous access for medication administration and blood draws. 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 Request made for placement of a percutaneous gastrostomy tube.  EXAM: 1. ULTRASOUND AND FLUOROSCOPIC GUIDED PICC LINE INSERTION 2. FLUOROSCOPIC GUIDED RECTAL ADMINISTRATION OF BARIUM TO OPACIFY THE TRANSVERSE COLON.  COMPARISON:  CT abdomen pelvis - 08/12/2014  MEDICATIONS: None.  CONTRAST:  None  FLUOROSCOPY TIME:  3 min, 18 seconds  COMPLICATIONS: None immediate  TECHNIQUE: The procedure, risks, benefits, and alternatives were explained to the patient's son and informed written consent was obtained. A timeout was performed prior to the initiation of the procedure.  The right upper extremity was prepped with chlorhexidine in a sterile fashion, and a sterile drape was  applied covering the operative field. Maximum barrier sterile technique with sterile gowns and gloves were used for the procedure. A timeout was performed prior to the initiation of the procedure. Local anesthesia was provided with 1% lidocaine.  Under direct ultrasound guidance, the right basilic vein was accessed with a micropuncture kit after the overlying soft tissues were anesthetized with 1% lidocaine. An ultrasound image was saved for documentation purposes. A guidewire was advanced to the level of the superior caval-atrial junction for measurement purposes and the PICC line was cut to length. A peel-away sheath was placed and a 36 cm, 5 [place], dual lumen was inserted to level of the superior caval-atrial junction. A post procedure spot fluoroscopic was obtained. The catheter easily aspirated and flushed and was sutured in place. A dressing was placed. The patient tolerated the procedure well without immediate post procedural complication.  At this point, attention was paid towards attempted percutaneous gastrostomy tube placement.  Multiple spot fluoroscopic images were obtained of the right upper abdominal quadrant appears to demonstrate the colon overlying the ventral aspect of the stomach as was demonstrated on preceding abdominal CT.  As such, barium was administered via the rectum with adequate opacification of the transverse colon, confirming this finding. As such, attempted percutaneous gastrostomy tube placement was not performed  FINDINGS: After catheter placement, the PICC tip lies within the superior cavoatrial junction. The catheter aspirates and flushes normally and is ready for immediate use.  The splenic flexure of the colon overlies the ventral wall of the stomach without a safe percutaneous window for percutaneous gastrostomy tube placement.  IMPRESSION: 1. Successful ultrasound and fluoroscopic guided placement of a right basilic vein approach, cm, 5 French, dual lumen PICC with tip at the  superior caval-atrial junction. The PICC line is ready for immediate use. 2. No safe percutaneous window for gastrostomy tube placement.  PLAN: This patient could be referred to surgery for a surgical placed gastrostomy tube as clinically indicated.   Electronically Signed   By: Sandi Mariscal M.D.   On: 08/13/2014 11:09   Ir Fluoro Rm 30-60 Min  08/13/2014   INDICATION: Poor venous access. In need of intravenous access for medication administration and blood draws. 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 Request made for placement of a percutaneous gastrostomy tube.  EXAM: 1. ULTRASOUND AND FLUOROSCOPIC GUIDED PICC LINE INSERTION 2. FLUOROSCOPIC GUIDED RECTAL ADMINISTRATION OF BARIUM TO OPACIFY THE TRANSVERSE COLON.  COMPARISON:  CT abdomen pelvis - 08/12/2014  MEDICATIONS: None.  CONTRAST:  None  FLUOROSCOPY TIME:  3 min, 18 seconds  COMPLICATIONS: None immediate  TECHNIQUE: The procedure, risks, benefits, and alternatives were explained to the patient's son and informed written consent was obtained. A timeout was performed prior to the initiation of the procedure.  The right upper extremity was prepped with chlorhexidine in a sterile fashion, and a sterile drape was applied covering the operative field. Maximum barrier sterile technique with sterile gowns and gloves were used for the procedure. A timeout was performed prior to the initiation of the procedure. Local anesthesia was provided with 1% lidocaine.  Under direct ultrasound guidance, the right basilic vein was accessed with a micropuncture kit after the overlying soft tissues were anesthetized with 1% lidocaine. An ultrasound image was saved for documentation purposes. A guidewire was advanced to the level of the superior caval-atrial junction for measurement purposes and the PICC line was cut to length. A peel-away sheath was placed and a 36 cm, 5 [place], dual lumen was inserted to level of the superior caval-atrial junction. A post procedure  spot fluoroscopic was obtained. The catheter easily aspirated and flushed and was sutured in place. A dressing was placed. The patient tolerated the procedure well without immediate post procedural complication.  At this point, attention was paid towards attempted percutaneous gastrostomy tube placement.  Multiple spot fluoroscopic images were obtained of the right upper abdominal quadrant appears to demonstrate the colon overlying the ventral aspect of the stomach as was demonstrated on preceding abdominal CT.  As such, barium was administered via the rectum with adequate opacification of the transverse colon, confirming this finding. As such, attempted percutaneous gastrostomy tube placement was not performed  FINDINGS: After catheter placement, the PICC tip lies within the superior cavoatrial junction. The catheter aspirates and flushes normally and is ready for immediate use.  The splenic flexure of the colon overlies the ventral wall of the stomach without a safe percutaneous window for percutaneous gastrostomy tube placement.  IMPRESSION: 1. Successful ultrasound and fluoroscopic guided placement of a right basilic vein approach, cm, 5 French, dual lumen PICC with tip at the superior caval-atrial junction. The PICC line is ready for immediate use. 2. No safe percutaneous window for gastrostomy tube placement.  PLAN: This patient could be referred to surgery for a surgical placed gastrostomy tube as clinically indicated.   Electronically Signed   By: Sandi Mariscal M.D.   On: 08/13/2014 11:09   Ir US Guide Vasc Access Right  08/14/2014   INDICATION: Poor venous access. In need of intravenous access for medication administration and blood draws. 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 Request made for placement of a percutaneous gastrostomy tube.  EXAM: 1. ULTRASOUND AND FLUOROSCOPIC GUIDED PICC LINE INSERTION 2. FLUOROSCOPIC GUIDED RECTAL ADMINISTRATION OF BARIUM TO OPACIFY THE TRANSVERSE COLON.   COMPARISON:  CT abdomen pelvis - 08/12/2014  MEDICATIONS: None.  CONTRAST:  None  FLUOROSCOPY TIME:  3 min, 18 seconds  COMPLICATIONS: None immediate  TECHNIQUE: The procedure, risks, benefits, and alternatives were explained to the patient's son and informed written consent was obtained. A timeout was performed prior to the initiation of the procedure.  The right upper extremity was prepped with chlorhexidine in a sterile fashion, and a sterile drape was applied covering the operative field. Maximum barrier sterile  technique with sterile gowns and gloves were used for the procedure. A timeout was performed prior to the initiation of the procedure. Local anesthesia was provided with 1% lidocaine.  Under direct ultrasound guidance, the right basilic vein was accessed with a micropuncture kit after the overlying soft tissues were anesthetized with 1% lidocaine. An ultrasound image was saved for documentation purposes. A guidewire was advanced to the level of the superior caval-atrial junction for measurement purposes and the PICC line was cut to length. A peel-away sheath was placed and a 36 cm, 5 [place], dual lumen was inserted to level of the superior caval-atrial junction. A post procedure spot fluoroscopic was obtained. The catheter easily aspirated and flushed and was sutured in place. A dressing was placed. The patient tolerated the procedure well without immediate post procedural complication.  At this point, attention was paid towards attempted percutaneous gastrostomy tube placement.  Multiple spot fluoroscopic images were obtained of the right upper abdominal quadrant appears to demonstrate the colon overlying the ventral aspect of the stomach as was demonstrated on preceding abdominal CT.  As such, barium was administered via the rectum with adequate opacification of the transverse colon, confirming this finding. As such, attempted percutaneous gastrostomy tube placement was not performed  FINDINGS: After  catheter placement, the PICC tip lies within the superior cavoatrial junction. The catheter aspirates and flushes normally and is ready for immediate use.  The splenic flexure of the colon overlies the ventral wall of the stomach without a safe percutaneous window for percutaneous gastrostomy tube placement.  IMPRESSION: 1. Successful ultrasound and fluoroscopic guided placement of a right basilic vein approach, cm, 5 French, dual lumen PICC with tip at the superior caval-atrial junction. The PICC line is ready for immediate use. 2. No safe percutaneous window for gastrostomy tube placement.  PLAN: This patient could be referred to surgery for a surgical placed gastrostomy tube as clinically indicated.   Electronically Signed   By: Sandi Mariscal M.D.   On: 08/13/2014 11:09   Dg Chest Port 1 View  08/07/2014   CLINICAL DATA:  Short of breath. Labored in shallow breathing. Unresponsive.  EXAM: PORTABLE CHEST - 1 VIEW  COMPARISON:  07/29/2014  FINDINGS: Probable linear atelectasis in the left mid lung. Normal heart size and pulmonary vascularity. No focal airspace disease in the lungs. No blunting of costophrenic angles. No pneumothorax. Mediastinal contours appear intact.  IMPRESSION: No active disease.   Electronically Signed   By: Lucienne Capers M.D.   On: 08/07/2014 00:02   Dg Chest Port 1 View  07/29/2014   CLINICAL DATA:  Intubation.  EXAM: PORTABLE CHEST - 1 VIEW  COMPARISON:  07/28/2014.  FINDINGS: Endotracheal tube 1.9 cm above the carina. Left IJ line in NG tube in stable position. Cardiac monitor stable position. Heart size is stable. Pulmonary vascularity is normal. Bibasilar atelectasis and/or infiltrates are present. Small bilateral pleural effusions cannot be excluded. A mild component congestive heart failure cannot be excluded. No pneumothorax. No acute osseus abnormality.  IMPRESSION: 1. Interval reposition of endotracheal tube. Its tip is now 1.9 cm above the carina left IJ line, and NG tube in  stable position. 2. Interval development of bibasilar infiltrates and possible tiny pleural effusions. Findings suggest mild CHF. Bibasilar pneumonia cannot be excluded.   Electronically Signed   By: Marcello Moores  Register   On: 07/29/2014 07:45   Dg Chest Portable 1 View  07/28/2014   CLINICAL DATA:  Endotracheal tube and orogastric tube placement  EXAM:  PORTABLE CHEST - 1 VIEW  COMPARISON:  07/27/2014  FINDINGS: Endotracheal tube is seen with tip directly at the carina directed toward left main bronchus. Left internal jugular central line is seen with tip over the junction of the brachiocephalic vein and superior vena cava. There is no pneumothorax. NG tube crosses the gastroesophageal junction.  Right lung is clear. Partial consolidation left lower lobe most consistent with partial atelectasis, improved when compared to the prior study. When compared to prior study there is improved aeration in the left lower lobe.  IMPRESSION: Endotracheal tube at the carina. Consider retracting the tube 2-3 cm and repeating the image. Continued atelectasis left lower lobe, but improved when compared to prior study. These results were called by telephone at the time of interpretation on 07/28/2014 at 2:23 am to [place], the patient's charge nurse, who verbally acknowledged these results.   Electronically Signed   By: Skipper Cliche M.D.   On: 07/28/2014 02:23   Dg Chest Port 1 View  07/27/2014   CLINICAL DATA:  Pneumonitis.  EXAM: PORTABLE CHEST - 1 VIEW  COMPARISON:  07/26/2014.  FINDINGS: Endotracheal tube ends between the clavicular heads and carina. Left IJ catheter is likely stable in positioning, although there is new distortion from leftward rotation. Orogastric tube enters the stomach at least.  There is no change in heart size and mediastinal contours when accounting for rotation. Implantable cardiac monitor has a stable orientation.  Worsening basilar lung aeration. Hazy appearance favors atelectasis and possibly small  pleural effusion. There is no edema or pneumothorax.  IMPRESSION: 1. Stable positioning of tubes and central line. 2. Significant improvement and left upper lobe aeration. Rapid clearing favors pneumonitis or atelectasis over pneumonia. 3. New bibasilar atelectasis.   Electronically Signed   By: Jorje Guild M.D.   On: 07/27/2014 06:25   Dg Chest Port 1 View  07/26/2014   CLINICAL DATA:  Ventilator dependent respiratory failure. Followup left upper lobe pneumonia.  EXAM: PORTABLE CHEST - 1 VIEW  COMPARISON:  Portable chest x-rays yesterday, 07/24/2014, 06/13/2014.  FINDINGS: Endotracheal tube tip in satisfactory position projecting approximately 4-5 cm above the carina. Nasogastric tube courses below the diaphragm into the stomach. Left jugular sent venous catheter tip projects over the upper SVC, unchanged.  Cardiac silhouette upper normal in size to slightly enlarged but stable. Airspace consolidation in the left upper lobe, slightly worse than on yesterday's examination. No new pulmonary parenchymal abnormalities.  IMPRESSION: Support apparatus satisfactory. Slight worsening of left upper lobe pneumonia since yesterday. No new abnormalities.   Electronically Signed   By: Evangeline Dakin M.D.   On: 07/26/2014 09:10   Portable Chest Xray In Am  07/25/2014   CLINICAL DATA:  Follow-up endotracheal tube position.  EXAM: PORTABLE CHEST - 1 VIEW  COMPARISON:  07/24/2014  FINDINGS: Endotracheal tube remains in place with tip approximately 2-2.5 cm above the carina. Left jugular central venous catheter is unchanged with tip near the brachiocephalic vein confluence. Enteric tube courses into the left upper abdomen. Loop recorder is again seen. Cardiac silhouette is within normal limits for size. Left perihilar opacity has decreased from the prior study. Right lung remains clear. No definite pleural effusion or pneumothorax is identified.  IMPRESSION: 1. Endotracheal tube 2-2.5 cm above the carina. 2. Improved  left upper lobe aeration.   Electronically Signed   By: [name]   On: 07/25/2014 09:13   Portable Chest Xray  07/24/2014   CLINICAL DATA:  Interval intubation of the patient ;  assess support tube and line. Placement  EXAM: PORTABLE CHEST - 1 VIEW  COMPARISON:  Portable chest x-ray of today's date.  FINDINGS: The endotracheal tube tip lies 2 cm above the crotch of the carina. The right internal jugular venous catheter tip lies at the level of the junction of the right and left brachiocephalic veins. The esophagogastric tube tip projects below the inferior margin of the image. A loop recorder is visible over the left atrial region. The cardiac silhouette is normal in size. The pulmonary vascularity is not engorged. There is confluent alveolar opacity in the left perihilar region that is more conspicuous than on the earlier study. The bony thorax exhibits no acute abnormality.  IMPRESSION: 1. The endotracheal tube tip lies 2.1 cm above the crotch of the carina. Withdrawal by 2-3 cm is recommended to avoid accidental mainstem bronchus intubation. 2. The left internal jugular venous catheter and the esophagogastric tube are in appropriate position radiographically. There is no postprocedure pneumothorax or hemothorax. 3. There is increased alveolar opacity in the low left perihilar region consistent with pneumonia. 4. These results were called by telephone at the time of interpretation on 07/24/2014 at 7:21 pm to [facility], RN, who verbally acknowledged these results.   Electronically Signed   By: David  [place]   On: 07/24/2014 19:26   Dg Chest Port 1 View  07/24/2014   CLINICAL DATA:  Recent aspiration  EXAM: PORTABLE CHEST - 1 VIEW  COMPARISON:  06/13/2014  FINDINGS: The cardiac shadow is stable. Monitoring device is again seen. The right lung is clear. The left lung demonstrates patchy infiltrate in the upper lobe which may be related to the patient's recent aspiration.  IMPRESSION: Left upper lobe  infiltrate. Followup films to resolution are recommended.   Electronically Signed   By: Inez Catalina M.D.   On: 07/24/2014 11:09   Dg Abd Portable 1v  08/15/2014   CLINICAL DATA:  Feeding tube placement.  EXAM: PORTABLE ABDOMEN - 1 VIEW  COMPARISON:  None.  FINDINGS: Feeding tube is seen with tip overlying proximal stomach. No evidence of dilated bowel loops. Residual contrast noted within the colon.  IMPRESSION: Feeding tube tip overlies the proximal stomach.   Electronically Signed   By: Earle Gell M.D.   On: 08/15/2014 16:20   Dg Abd Portable 1v  07/30/2014   CLINICAL DATA:  Feeding tube placement  EXAM: PORTABLE ABDOMEN - 1 VIEW  COMPARISON:  Portable exam 1642 hr compared to 07/28/2014  FINDINGS: Feeding tube coiled at mid stomach with tip reflected to fundus.  Nonobstructive bowel gas pattern.  Minimal atelectasis and questionable fusions at lung bases.  Loop recorder projects over chest.  IMPRESSION: Feeding tube coiled at mid to proximal stomach.   Electronically Signed   By: Lavonia Dana M.D.   On: 07/30/2014 16:55   Dg Abd Portable 1v  07/28/2014   CLINICAL DATA:  Endotracheal tube and orogastric tube placement  EXAM: PORTABLE ABDOMEN - 1 VIEW  COMPARISON:  None.  FINDINGS: Orogastric tube crosses the gastroesophageal junction with tip in the left upper quadrant in the anticipated position of stomach. Endotracheal tube is also seen on this study with tip about 12 mm above the carinal.  IMPRESSION: Endotracheal tube and OG tube as described.   Electronically Signed   By: Skipper Cliche M.D.   On: 07/28/2014 02:24   Dg Swallowing Func-speech Pathology  08/01/2014   Katherene Ponto [place], CCC-SLP     08/01/2014  1:43 PM Objective  Swallowing Evaluation: Modified Barium Swallowing Study   Patient Details  Name: [name] [name] MRN: [id number] Date of Birth: February 18, 1941  Today's Date: 08/01/2014 Time: 1300-1330 SLP Time Calculation (min): 30 min  Past Medical History:  Past Medical History  Diagnosis  Date  . Hypertension   . Diabetes mellitus   . Chronic renal insufficiency   . Hyperlipidemia   . Osteopenia   . CVA (cerebral infarction)   . Thyroid disease    Past Surgical History:  Past Surgical History  Procedure Laterality Date  . Eye surgery    . 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 Tee without cardioversion N/A 03/23/2014    Procedure: TRANSESOPHAGEAL ECHOCARDIOGRAM (TEE);  Surgeon:  Dorothy Spark, MD;  Location: [facility];  Service:  Cardiovascular;  Laterality: N/A;   HPI:  [name] with COPD, CVA, CK D., Diabetes admitted 9/18  with acute respiratory distress , hypercarbic respiratory  failure, DKA with glucose of 802 and anion gap of 19 , requiring  mechanical ventilation. Chest x-ray showed left upper lobe  infiltrate. Was placed on BiPAP initially, but unresponsive on  arrival to the ICU, hence intubated. She had recent admit from  8/6 to 814 for hypercarbic failure that improved with BiPAP-dc'd  to NH , then home since 9/14. 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 Patient has been seen by SLP  during previous admissions and has been observed with a delayed  oral phase and suspected delayed swallow initiation with s/s of  aspiration with thin liquids via straw. Most frequent diet  recommendations for dysphagia 1 solids, thin liquids.      Assessment / Plan / Recommendation Clinical Impression  Dysphagia Diagnosis: Moderate oral phase dysphagia;Mild  pharyngeal phase dysphagia Clinical impression: Pt presents with a primary cognitive based,  oral dyspahgia with lingual pumping and piecemeal transit of PO,  leading to multiple swallows per bolus. Swallow response is only  slightly delayed but mild base of tongue weakness results in  trace vallecular residuals and very trace penetration after the  swallow with nectar thick liquids. Pt senses penetrate and expels  with a soft throat clear. Given overt aspiration  of thin at  bedside and pts AMS, thin liquids not tested though expect pt  will be able to upgrade as mentation improves. Pt to initiate dys  1 (puree) diet and nectar thick liquids. SLP will follow for  tolerance.      Treatment Recommendation  Therapy as outlined in treatment plan below    Diet Recommendation Dysphagia 1 (Puree);Nectar-thick liquid   Liquid Administration via: Cup;Straw Medication Administration: Whole meds with puree Supervision: Staff to assist with self feeding Compensations: Slow rate;Small sips/bites Postural Changes and/or Swallow Maneuvers: Seated upright 90  degrees;Upright 30-60 min after meal    Other  Recommendations Oral Care Recommendations: Oral care BID Other Recommendations: Order thickener from pharmacy   Follow Up Recommendations  Skilled Nursing facility    Frequency and Duration min 2x/week  2 weeks   Pertinent Vitals/Pain NA    SLP Swallow Goals     General HPI: [name] with COPD, CVA, CK D., Diabetes  admitted 9/18 with acute respiratory distress , hypercarbic  respiratory failure, DKA with glucose of 802 and anion gap of 19  , requiring mechanical ventilation. Chest x-ray showed left upper  lobe infiltrate. Was placed on BiPAP initially, but unresponsive  on arrival to  the ICU, hence intubated. She had recent admit from  8/6 to 814 for hypercarbic failure that improved with BiPAP-dc'd  to NH , then home since 9/14. 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 Patient has been seen by SLP  during previous admissions and has been observed with a delayed  oral phase and suspected delayed swallow initiation with s/s of  aspiration with thin liquids via straw. Most frequent diet  recommendations for dysphagia 1 solids, thin liquids.  Type of Study: Modified Barium Swallowing Study Reason for Referral: Objectively evaluate swallowing function Previous Swallow Assessment: see HPI Diet Prior to this Study: NPO Temperature Spikes Noted: No Respiratory  Status: Room air History of Recent Intubation: Yes Length of Intubations (days): 2 days Date extubated: 07/29/14 Behavior/Cognition: Alert;Cooperative;Requires cueing Oral Cavity - Dentition: Edentulous Oral Motor / Sensory Function: Within functional limits Self-Feeding Abilities: Total assist Patient Positioning: Upright in chair Baseline Vocal Quality: Clear Volitional Cough: Weak Volitional Swallow: Able to elicit Pharyngeal Secretions: Not observed secondary MBS    Reason for Referral Objectively evaluate swallowing function   Oral Phase Oral Preparation/Oral Phase Oral Phase: Impaired Oral - Nectar Oral - Nectar Cup: Lingual pumping;Reduced posterior  propulsion;Piecemeal swallowing;Delayed oral transit Oral - Nectar Straw: Lingual pumping;Reduced posterior  propulsion;Piecemeal swallowing;Delayed oral transit Oral - Solids Oral - Puree: Lingual pumping;Reduced posterior  propulsion;Piecemeal swallowing;Delayed oral transit   Pharyngeal Phase Pharyngeal Phase Pharyngeal Phase: Impaired Pharyngeal - Nectar Pharyngeal - Nectar Cup: Reduced tongue base  retraction;Penetration/Aspiration after swallow;Pharyngeal  residue - valleculae Penetration/Aspiration details (nectar cup): Material enters  airway, CONTACTS cords then ejected out;Material does not enter  airway Pharyngeal - Nectar Straw: Reduced tongue base  retraction;Penetration/Aspiration after swallow;Pharyngeal  residue - valleculae Penetration/Aspiration details (nectar straw): Material does not  enter airway;Material enters airway, CONTACTS cords then ejected  out Pharyngeal - Solids Pharyngeal - Puree: Reduced tongue base retraction;Pharyngeal  residue - valleculae Penetration/Aspiration details (puree): Material does not enter  airway  Cervical Esophageal Phase    GO    Cervical Esophageal Phase Cervical Esophageal Phase: Impaired Cervical Esophageal Phase - Comment Cervical Esophageal Comment: Appearance of slow transit, stasis  through GE junction         [name], MA CCC-SLP [phone]  [place], Katherene Ponto 08/01/2014, 1:42 PM     Microbiology: No results found for this or any previous visit (from the past 240 hour(s)).   Labs: Basic Metabolic Panel:  Recent Labs Lab 08/14/14 0500 08/15/14 0442 08/18/14 0608 08/19/14 0435 08/20/14 0555  NA 151* 147 147 148* 149*  K 4.3 3.8 3.7 3.6* 3.7  CL 113* 110 110 110 112  CO2 '24 26 29 28 28  ' GLUCOSE 185* 229* 141* 207* 172*  BUN '12 10 18 18 14  ' CREATININE 1.19* 1.04 1.08 1.14* 1.14*  CALCIUM 8.4 8.5 8.3* 8.5 8.7   Liver Function Tests: No results found for this basename: AST, ALT, ALKPHOS, BILITOT, PROT, ALBUMIN,  in the last 168 hours No results found for this basename: LIPASE, AMYLASE,  in the last 168 hours No results found for this basename: AMMONIA,  in the last 168 hours CBC:  Recent Labs Lab 08/19/14 0435  WBC 4.7  HGB 8.9*  HCT 27.4*  MCV 89.5  PLT 219   Cardiac Enzymes: No results found for this basename: CKTOTAL, CKMB, CKMBINDEX, TROPONINI,  in the last 168 hours BNP: BNP (last 3 results)  Recent Labs  06/09/14 1344 06/12/14 1330 07/24/14 1000  PROBNP 1271.0* 1666.0*  6064.0*   CBG:  Recent Labs Lab 08/19/14 1631 08/19/14 2005 08/20/14 0003 08/20/14 0406 08/20/14 0840  GLUCAP 118* 135* 177* 158* 173*       Signed:  Kelvin Cellar  [facility] 08/20/2014, 11:12 AM

## 2014-08-20 NOTE — Progress Notes (Addendum)
[facility] scheduled to [facility] today [facility] a [facility] for several years. [facility] notified of patient's return as well as patient's son (Less). Clinical Social Worker has prepared discharge packet, notified nurse to give report to receiving facility and arranged [facility] transportation for 2PM. No further intervention needed. CSW signing off.   Derenda FennelBashira Vaughn Beaumier, MSW, LCSWA [phone] 08/20/2014 12:57 PM

## 2014-08-20 NOTE — Progress Notes (Signed)
   This NP attempted to contact son Mauri PoleLessley McNeil to f/u on further documentation and clarification of GOCs.  Left message X2 and contact number.  Recommend hospice referral on discharge back to SNF.  Lorinda CreedMary Zehra Rucci NP  Palliative Medicine Team Team Phone # [phone] Pager [phone]

## 2014-08-24 ENCOUNTER — Non-Acute Institutional Stay (SKILLED_NURSING_FACILITY): Payer: Commercial Managed Care - HMO | Admitting: Internal Medicine

## 2014-08-24 DIAGNOSIS — E039 Hypothyroidism, unspecified: Secondary | ICD-10-CM

## 2014-08-24 DIAGNOSIS — I639 Cerebral infarction, unspecified: Secondary | ICD-10-CM

## 2014-08-24 DIAGNOSIS — IMO0002 Reserved for concepts with insufficient information to code with codable children: Secondary | ICD-10-CM

## 2014-08-24 DIAGNOSIS — E1129 Type 2 diabetes mellitus with other diabetic kidney complication: Secondary | ICD-10-CM

## 2014-08-24 DIAGNOSIS — E1165 Type 2 diabetes mellitus with hyperglycemia: Secondary | ICD-10-CM

## 2014-08-24 DIAGNOSIS — E785 Hyperlipidemia, unspecified: Secondary | ICD-10-CM

## 2014-08-24 NOTE — Progress Notes (Signed)
HISTORY & PHYSICAL  DATE: 08/24/2014   FACILITY: [facility]  LEVEL OF CARE: SNF (31)  ALLERGIES:  Allergies  Allergen Reactions  . Erythromycin Shortness Of Breath  . Iodine Hives  . Penicillins Hives  . Latex Hives and Itching    CHIEF COMPLAINT:  Manage CVA, diabetes mellitus and hypothyroidism  HISTORY OF PRESENT ILLNESS: [name] was hospitalized secondary to an acute CVA. after hospitalization patient is readmitted back to the facility for short-term rehabilitation.  CVA: The patient's CVA remains stable.  Patient denies new neurologic symptoms such as numbness, tingling, weakness, speech difficulties or visual disturbances.  No complications reported from the medications currently being used. Patient presented with inability to speak and was diagnosed with a large acute left corpus callosum infarct. She is currently on a dysphagia 1 diet.  DM:pt's DM remains stable.  Pt denies polyuria, polydipsia, polyphagia, changes in vision or hypoglycemic episodes.  No complications noted from the medication presently being used.  Last hemoglobin A1c is 9 in 10-15.  HYPOTHYROIDISM: The hypothyroidism remains stable. No complications noted from the medications presently being used.  The patient denies fatigue or constipation.  Last TSH is 2.65 in 9-15  PAST MEDICAL HISTORY :  Past Medical History  Diagnosis Date  . Hypertension   . Diabetes mellitus   . Chronic renal insufficiency   . Hyperlipidemia   . Osteopenia   . CVA (cerebral infarction)   . Thyroid disease     PAST SURGICAL HISTORY: Past Surgical History  Procedure Laterality Date  . Eye surgery    . Loop recorder implant  03-24-2014    MDT LinQ implanted by Dr Johney Frame for cryptogenic stroke  . Tee without cardioversion N/A 03/23/2014    Procedure: TRANSESOPHAGEAL ECHOCARDIOGRAM (TEE);  Surgeon: Lars Masson, MD;  Location: [facility] ENDOSCOPY;  Service:  Cardiovascular;  Laterality: N/A;    SOCIAL HISTORY:  reports that she quit smoking about 2 months ago. Her smoking use included Cigarettes. She has a 30 pack-year smoking history. She has never used smokeless tobacco. She reports that she drinks alcohol. She reports that she does not use illicit drugs.  FAMILY HISTORY:  Family History  Problem Relation Age of Onset  . Diabetes Mother   . Hypertension Mother     CURRENT MEDICATIONS: Reviewed per MAR/see medication list  REVIEW OF SYSTEMS:  See HPI otherwise 14 point ROS is negative.  PHYSICAL EXAMINATION  VS:  See VS section  GENERAL: no acute distress, moderately obese body habitus EYES: conjunctivae normal, sclerae normal, normal eye lids MOUTH/THROAT: lips without lesions,no lesions in the mouth,tongue is without lesions,uvula elevates in midline NECK: supple, trachea midline, no neck masses, no thyroid tenderness, no thyromegaly LYMPHATICS: no LAN in the neck, no supraclavicular LAN RESPIRATORY: breathing is even & unlabored, BS CTAB CARDIAC: RRR, no murmur,no extra heart sounds, +2 bilateral lower extremity edema GI:  ABDOMEN: abdomen soft, normal BS, no masses, no tenderness  LIVER/SPLEEN: no hepatomegaly, no splenomegaly MUSCULOSKELETAL: HEAD: normal to inspection  EXTREMITIES: LEFT UPPER EXTREMITY: Unable to assess RIGHT UPPER EXTREMITY:  Moderate range of motion, decreased strength & tone LEFT LOWER EXTREMITY:  Minimal range of motion, decreased strength & tone RIGHT LOWER EXTREMITY:  Minimal range of motion, decreased strength & tone PSYCHIATRIC: the patient is alert & oriented to person, affect & behavior appropriate  LABS/RADIOLOGY:  Labs reviewed: Basic Metabolic Panel:  Recent Labs  16/10/96 0315  07/30/14 0430  07/31/14 0400  08/03/14 1010  08/18/14 0608 08/19/14 0435 08/20/14 0555  NA 146  < >  --   < > 148*  < >  --   < > 147 148* 149*  K 3.2*  < >  --   < > 3.6*  < >  --   < > 3.7 3.6* 3.7  CL  105  < >  --   < > 106  < >  --   < > 110 110 112  CO2 23  < >  --   < > 28  < >  --   < > 29 28 28   GLUCOSE 89  < >  --   < > 84  < >  --   < > 141* 207* 172*  BUN 26*  < >  --   < > 31*  < >  --   < > 18 18 14   CREATININE 1.58*  < >  --   < > 1.27*  < >  --   < > 1.08 1.14* 1.14*  CALCIUM 8.6  < >  --   < > 8.6  < >  --   < > 8.3* 8.5 8.7  MG 1.4*  < > 2.0  --  2.0  --  2.0  --   --   --   --   PHOS 3.2  --   --   --   --   --   --   --   --   --   --   < > = values in this interval not displayed. Liver Function Tests:  Recent Labs  08/04/14 0330 08/06/14 2230 08/07/14 0726  AST 40* 21 20  ALT 29 16 16   ALKPHOS 71 67 68  BILITOT <0.2* 0.2* <0.2*  PROT 5.8* 5.9* 5.9*  ALBUMIN 2.3* 2.3* 2.3*    Recent Labs  08/03/14 1010  AMMONIA 21   CBC:  Recent Labs  07/24/14 1000  08/06/14 2230  08/07/14 0726 08/13/14 0830 08/19/14 0435  WBC 9.1  < > 9.4  --  9.6 4.6 4.7  NEUTROABS 8.1*  --  7.0  --  6.7  --   --   HGB 10.5*  < > 9.1*  < > 9.5* 9.8* 8.9*  HCT 33.3*  < > 28.0*  < > 29.8* 31.3* 27.4*  MCV 93.5  < > 90.6  --  93.7 92.1 89.5  PLT 237  < > 290  --  298 265 219  < > = values in this interval not displayed.  Lipid Panel:  Recent Labs  04/25/14 0315 08/02/14 0300 08/07/14 0324  HDL 37* 62 57   Cardiac Enzymes:  Recent Labs  05/01/14 1250 07/24/14 1000 07/24/14 1950  TROPONINI <0.30 <0.30 <0.30   CBG:  Recent Labs  08/20/14 0406 08/20/14 0840 08/20/14 1151  GLUCAP 158* 173* 210*    Renal Artery Duplex Evaluation  Patient:    [name] [name], Covin MR #:       [id number] Study Date: 08/03/2014 Gender:     F Age:        73 Height: Weight: BSA: Pt. Status: Room:       2H22C   ATTENDING    Jetty Duhamel T  SONOGRAPHER  Farrel Demark, RVT, RDMS  ADMITTING    Prentice Docker  Reports also to:  ------------------------------------------------------------------- History and  indications:  Indications  Uncontrolled hypertension.  History  Diagnostic evaluation.  ------------------------------------------------------------------- Study information:  Study status:  Routine.  Procedure:  A vascular evaluation was performed with the patient in the supine position. Image quality was poor. The study was technically limited due to body habitus, movement, and poor patient cooperation.    Renal artery duplex evaluation.     Renal artery duplex evaluation following standard established protocol. Color and pulsed wave Doppler interrogation of intrarenal and extrarenal vasculature.[name] [name]:  Patient birthdate: 1941/05/08.  Age:  Patient is 73 yr old.  Sex:  Gender: female.  Study date:  Study date: 08/03/2014. Study time: 01:18 PM.  Location:  Bedside.  Patient status:  Inpatient.  Arterial flow:  +--------------+--------+-------+---------+-------------+---------+ Location      V sys   V ed   DimensionComment      Resistive +--------------+--------+-------+---------+-------------+---------+ Right renal - 145 cm/s23 cm/s------------------------------- proximal                                                     +--------------+--------+-------+---------+-------------+---------+ Right renal - 65 cm/s 10 cm/s------------------------------- mid                                                          +--------------+--------+-------+---------+-------------+---------+ Right renal - 74 cm/s 11 cm/s------------------------------- distal                                                       +--------------+--------+-------+---------+-------------+---------+ Left renal -  206 cm/s23 cm/s------------------------------- proximal                                                     +--------------+--------+-------+---------+-------------+---------+ Left renal -  66 cm/s 15  cm/s------------------------------- distal                                                       +--------------+--------+-------+---------+-------------+---------+ Celiac -      144 cm/s18 cm/s------------------------------- proximal                                                     +--------------+--------+-------+---------+-------------+---------+ Superior      247 cm/s27 cm/s------------------------------- mesenteric -                                                 proximal                                                     +--------------+--------+-------+---------+-------------+---------+  Right arcuate 39 cm/s 10 cm/s----------------------0.75      - superior                                                   +--------------+--------+-------+---------+-------------+---------+ Right arcuate 23 cm/s 7 cm/s ----------------------0.68      - mid                                                        +--------------+--------+-------+---------+-------------+---------+ Right arcuate 40 cm/s 11 cm/s----------------------0.73      - inferior                                                   +--------------+--------+-------+---------+-------------+---------+ Right         29 cm/s 5 cm/s ------------------------------- segmental -                                                  mid                                                          +--------------+--------+-------+---------+-------------+---------+ L arcuate -   39 cm/s 10 cm/s----------------------0.73      superior                                                     +--------------+--------+-------+---------+-------------+---------+ L arcuate -   18 cm/s 6 cm/s ----------------------0.68      mid                                                           +--------------+--------+-------+---------+-------------+---------+ L arcuate -   22 cm/s 5 cm/s ----------------------0.78      inferior                                                     +--------------+--------+-------+---------+-------------+---------+ L segmental - 44 cm/s 9 cm/s ------------------------------- mid                                                          +--------------+--------+-------+---------+-------------+---------+  Aorta -       ---------------2.24 cm  ---------------------- proximal                                                     +--------------+--------+-------+---------+-------------+---------+ Aorta - mid   ---------------1.75 cm  ---------------------- +--------------+--------+-------+---------+-------------+---------+ Aorta - distal---------------1.79 cm  ---------------------- +--------------+--------+-------+---------+-------------+---------+ Right kidney  ---------------9.91 cm  A lateral mid---------                                       pole cyst                                                    measuring                                                    1.17 cm and a                                                mid pole cyst                                                that is                                                      exophytic,                                                   measuring 0.8                                                cm.                    +--------------+--------+-------+---------+-------------+---------+ Left kidney   ---------------8.59 cm  ---------------------- +--------------+--------+-------+---------+-------------+---------+  ------------------------------------------------------------------- Summary:  - Technically difficult study. - No evidence of renal artery  stenosis noted bilaterally. - Bilateral abnormal intrarenal resistive indices. Etiology   unknown. Carotid Duplex Study  Patient:    Chavy, [name] [name] MR #:       [id number] Study Date: 08/07/2014 Gender:     F Age:        73 Height: Weight: BSA: Pt. Status: Room:       4N10C  SONOGRAPHER  Thereasa Parkin, RVT  ATTENDING    [place], [place] [address] [id number]  Ann Lions [id number]  Reports also to:  ------------------------------------------------------------------- History and indications:  Indications  784.3 Aphasia.  History  Diagnostic evaluation. Aphasia. History of CVA.  ------------------------------------------------------------------- Study information:  Study status:  Routine.  Procedure:  The right CCA, right ECA, right ICA, right vertebral, left CCA, left ECA, left ICA, and left vertebral arteries were examined. A vascular evaluation was performed with the patient in the supine position.    Carotid duplex study.     Carotid Duplex exam including 2D imaging, color and spectral Doppler were performed on the extracranial carotid and vertebral arteries using standard established protocols. Birthdate:  Patient birthdate: September 20, 1941.  Age:  Patient is 73 yr old.  Sex:  Gender: female.  Study date:  Study date: 08/07/2014. Study time: 09:43 AM.  Location:  Bedside.  Patient status: Inpatient.  Arterial flow:  +---------------+---------+--------+--------------+---------------+ Location       V sys    V ed    Flow analysis Comment         +---------------+---------+--------+--------------+---------------+ Right CCA -    89 cm/s  9 cm/s  ----------------------------- proximal                                                      +---------------+---------+--------+--------------+---------------+ Right CCA -    -77 cm/s -13 cm/s----------------------------- distal                                                         +---------------+---------+--------+--------------+---------------+ Right ECA      -178 cm/s0 cm/s  ----------------------------- +---------------+---------+--------+--------------+---------------+ Right ICA -    -61 cm/s -11 cm/s--------------Mild            proximal                                      heterogeneous                                                 plaque.         +---------------+---------+--------+--------------+---------------+ Right ICA -    -74 cm/s -14 cm/s----------------------------- distal                                                        +---------------+---------+--------+--------------+---------------+ Right vertebral62 cm/s  10 cm/s Antegrade flow--------------- +---------------+---------+--------+--------------+---------------+ Left CCA -     110 cm/s 9 cm/s  ----------------------------- proximal                                                      +---------------+---------+--------+--------------+---------------+  Left CCA -     54 cm/s  11 cm/s ----------------------------- distal                                                        +---------------+---------+--------+--------------+---------------+ Left ECA       -185 cm/s0 cm/s  ----------------------------- +---------------+---------+--------+--------------+---------------+ Left ICA -     -156 cm/s-23 cm/s--------------Calcific        proximal                                      plaque.         +---------------+---------+--------+--------------+---------------+ Left ICA -     -120 cm/s-38 cm/s----------------------------- distal                                                        +---------------+---------+--------+--------------+---------------+ Left vertebral 70 cm/s  9 cm/s  Antegrade  flow--------------- +---------------+---------+--------+--------------+---------------+  ------------------------------------------------------------------- Summary:  - The vertebral arteries appear patent with antegrade flow. - Findings consistent with 1-39 percent stenosis involving the   right internal carotid artery and the left internal carotid   artery. - Acoustic shadowing may obscure higher velocities in the left   internal carotid artery.  Bilateral Lower Extremity Venous Duplex Evaluation  Patient:    Aviyanna, [name] [name] MR #:       [id number] Study Date: 08/07/2014 Gender:     F Age:        73 Height: Weight: BSA: Pt. Status: Room:       2C07C   SONOGRAPHER  Thereasa Parkin, RVT  ATTENDING    Penny Pia [address] [id number]  Rosaura Carpenter [id number]  Reports also to:  ------------------------------------------------------------------- History and indications:  History  Diagnostic evaluation. Aphasia. Patient has PFO.  ------------------------------------------------------------------- Study information:  Study status:  Routine.  Procedure:  A vascular evaluation was performed with the patient in the supine position. The right common femoral, right femoral, right profunda femoral, right popliteal, right peroneal, right posterior tibial, left common femoral, left femoral, left profunda femoral, left popliteal, left peroneal, and left posterior tibial veins were studied.    Bilateral lower extremity venous duplex evaluation.     Doppler flow study including B-mode compression maneuvers of all visualized segments, color flow Doppler and selected views of pulsed wave Doppler. Birthdate:  Patient birthdate: [birth year].  Age:  Patient is 73 yr old.  Sex:  Gender: female.  Study date:  Study date: 08/07/2014. Study time: 09:55 AM.  Location:  Bedside.  Patient status: Inpatient.  Venous  flow:  +--------------------------+-------+------------------------------+ Location                  OverallFlow properties                +--------------------------+-------+------------------------------+ Right common femoral      Patent Phasic; spontaneous;  compressible                   +--------------------------+-------+------------------------------+ Right femoral             Patent Compressible                   +--------------------------+-------+------------------------------+ Right profunda femoral    Patent Compressible                   +--------------------------+-------+------------------------------+ Right popliteal           Patent Phasic; spontaneous;                                            compressible                   +--------------------------+-------+------------------------------+ Right posterior tibial    Patent Compressible                   +--------------------------+-------+------------------------------+ Right peroneal            Patent Compressible                   +--------------------------+-------+------------------------------+ Right saphenofemoral      Patent Compressible                   junction                                                        +--------------------------+-------+------------------------------+ Left common femoral       Patent Phasic; spontaneous;                                            compressible                   +--------------------------+-------+------------------------------+ Left femoral              Patent Compressible                   +--------------------------+-------+------------------------------+ Left profunda femoral     Patent Compressible                   +--------------------------+-------+------------------------------+ Left popliteal            Patent Phasic; spontaneous;                                             compressible                   +--------------------------+-------+------------------------------+ Left posterior tibial     Patent Compressible                   +--------------------------+-------+------------------------------+ Left peroneal             Patent Compressible                   +--------------------------+-------+------------------------------+ Left saphenofemoral       Patent Compressible  junction                                                        +--------------------------+-------+------------------------------+  ------------------------------------------------------------------- Summary:  - No evidence of deep vein thrombosis involving the right lower   extremity. - No evidence of deep vein thrombosis involving the left lower   extremity.  Upper Extremity Venous Duplex Study  Patient:    [name] [name], [name] [name] MR #:       [id number] Study Date: 08/07/2014 Gender:     F Age:        73 Height: Weight: BSA: Pt. Status: Room:       2C07C   SONOGRAPHER  Thereasa Parkin, RVT  ATTENDING    Penny Pia [address] [id number]  Rosaura Carpenter [id number]  Reports also to:  ------------------------------------------------------------------- History and indications:  History  Diagnostic evaluation. Aphasia. Patient has PFO.  ------------------------------------------------------------------- Study information:  Study status:  Routine.  Procedure:  The right internal jugular, right subclavian, right axillary, right basilic, right cephalic, right brachial, right radial, right ulnar, left axillary, left basilic, left cephalic, left brachial veins were studied. A vascular evaluation was performed with the patient in the supine position.    Bilateral upper extremity venous duplex study. Doppler flow study including B-mode compression maneuvers of all visualized segments, color  flow Doppler and selected views of pulsed wave Doppler.  Birthdate:  Patient birthdate: 18-Jun-1941. Age:  Patient is 73 yr old.  Sex:  Gender: female.  Study date: Study date: 08/07/2014. Study time: 10:05 AM.  Location:  Bedside. Patient status:  Inpatient.  Venous flow:  +----------------------+-------+---------------------------------+ Location              OverallFlow properties                   +----------------------+-------+---------------------------------+ Right internal jugularPatent Phasic; spontaneous; compressible +----------------------+-------+---------------------------------+ Right subclavian      Patent Phasic; spontaneous; compressible +----------------------+-------+---------------------------------+ Right axillary        Patent Phasic; spontaneous; compressible +----------------------+-------+---------------------------------+ Right brachial        Patent Compressible; normal augmentation +----------------------+-------+---------------------------------+ Right cephalic        Patent Compressible                      +----------------------+-------+---------------------------------+ Right basilic         Patent Compressible                      +----------------------+-------+---------------------------------+ Left internal jugular Patent Phasic; spontaneous; compressible +----------------------+-------+---------------------------------+ Left subclavian       Patent Phasic; spontaneous; compressible +----------------------+-------+---------------------------------+ Left axillary         Patent Phasic; spontaneous; compressible +----------------------+-------+---------------------------------+ Left brachial         Patent Compressible; normal augmentation +----------------------+-------+---------------------------------+ Left cephalic         Patent Compressible                       +----------------------+-------+---------------------------------+ Left basilic          Patent Compressible                      +----------------------+-------+---------------------------------+  ------------------------------------------------------------------- Summary: No evidence of deep vein or superficial thrombosis involving  the right upper extremity and left upper extremity.  Transthoracic Echocardiography  Patient:    [name] [name], [name] [name] MR #:       [id number] Study Date: 08/07/2014 Gender:     F Age:        73 Height:     160 cm Weight:     75.3 kg BSA:        1.85 m^2 Pt. Status: Room:       [facility]   SONOGRAPHER  Silvano Bilis, RCS  ATTENDING    [place], [place] [address], [place]el Petrin [id number]  Lorain Childes [id number]  PERFORMING   [facility], Inpatient  cc:  ------------------------------------------------------------------- LV EF: 65% -   70%  ------------------------------------------------------------------- Indications:      TIA 435.9.  ------------------------------------------------------------------- History:   PMH:   Stroke.  Chronic obstructive pulmonary disease. Risk factors:  Former tobacco use. Hypertension. Diabetes mellitus. Dyslipidemia.  ------------------------------------------------------------------- Study Conclusions  - Left ventricle: The cavity size was normal. Wall thickness was   normal. Systolic function was vigorous. The estimated ejection   fraction was in the range of 65% to 70%. Doppler parameters are   consistent with abnormal left ventricular relaxation (grade 1   diastolic dysfunction). Doppler parameters are consistent with   elevated mean left atrial filling pressure. - Tricuspid valve: There was mild-moderate regurgitation. - Pulmonary arteries: PA peak pressure: 53 mm Hg (S). - Pericardium, extracardiac: A trivial pericardial effusion was   identified.  Transthoracic echocardiography.  M-mode,  complete 2D, spectral Doppler, and color Doppler.  Birthdate:  Patient birthdate: 01-24-1941.  Age:  Patient is 73 yr old.  Sex:  Gender: female. BMI: 29.4 kg/m^2.  Blood pressure:     160/50  Patient status: Inpatient.  Study date:  Study date: 08/07/2014. Study time: 11:57 AM.  Location:  ICU/CCU  -------------------------------------------------------------------  ------------------------------------------------------------------- Left ventricle:  The cavity size was normal. Wall thickness was normal. Systolic function was vigorous. The estimated ejection fraction was in the range of 65% to 70%. Doppler parameters are consistent with abnormal left ventricular relaxation (grade 1 diastolic dysfunction). Doppler parameters are consistent with elevated mean left atrial filling pressure.  ------------------------------------------------------------------- Aortic valve:   Mildly thickened, mildly calcified leaflets. Doppler:  There was no regurgitation.  ------------------------------------------------------------------- Mitral valve:   Mildly thickened leaflets .  Doppler:  There was no significant regurgitation.    Peak gradient (D): 4 mm Hg.  ------------------------------------------------------------------- Left atrium:  The atrium was normal in size.  ------------------------------------------------------------------- Right ventricle:  The cavity size was normal. Wall thickness was normal. Systolic function was normal.  ------------------------------------------------------------------- Tricuspid valve:   Doppler:  There was mild-moderate regurgitation.   ------------------------------------------------------------------- Right atrium:  The atrium was normal in size.  ------------------------------------------------------------------- Pericardium:  A trivial pericardial effusion was  identified.  ------------------------------------------------------------------- Measurements   Left ventricle                         Value        Reference  LV ID, ED, PLAX chordal        (L)     37.3  mm     43 - 52  LV ID, ES, PLAX chordal                23.4  mm     23 - 38  LV fx shortening, PLAX chordal         37    %      >=  29  LV PW thickness, ED                    10.7  mm     ---------  IVS/LV PW ratio, ED                    0.96         <=1.3  LV e&', lateral                         5.26  cm/s   ---------  LV E/e&', lateral                       18.48        ---------  LV e&', medial                          5.15  cm/s   ---------  LV E/e&', medial                        18.87        ---------  LV e&', average                         5.21  cm/s   ---------  LV E/e&', average                       18.67        ---------    Ventricular septum                     Value        Reference  IVS thickness, ED                      10.3  mm     ---------    Aorta                                  Value        Reference  Aortic root ID, ED                     25    mm     ---------    Left atrium                            Value        Reference  LA ID, A-P, ES                         27    mm     ---------  LA ID/bsa, A-P                         1.46  cm/m^2 <=2.2  LA volume, ES, 1-p A4C                 47    ml     ---------  LA volume/bsa, ES, 1-p A4C             25.4  ml/m^2 ---------  LA volume, ES, 1-p A2C  35    ml     ---------  LA volume/bsa, ES, 1-p A2C             18.9  ml/m^2 ---------    Mitral valve                           Value        Reference  Mitral E-wave peak velocity            97.2  cm/s   ---------  Mitral A-wave peak velocity            116   cm/s   ---------  Mitral deceleration time               151   ms     150 - 230  Mitral peak gradient, D                4     mm Hg  ---------  Mitral E/A ratio, peak                 0.8           ---------    Pulmonary arteries                     Value        Reference  PA pressure, S, DP             (H)     53    mm Hg  <=30    Tricuspid valve                        Value        Reference  Tricuspid regurg peak velocity         335   cm/s   ---------  Tricuspid peak RV-RA gradient          45    mm Hg  ---------    Systemic veins                         Value        Reference  Estimated CVP                          8     mm Hg  ---------    Right ventricle                        Value        Reference  RV pressure, S, DP             (H)     53    mm Hg  <=30  CT HEAD WITHOUT CONTRAST   TECHNIQUE: Contiguous axial images were obtained from the base of the skull through the vertex without intravenous contrast.   COMPARISON:  CT scan of July 24, 2014.   FINDINGS: Bony calvarium appears intact. Mild diffuse cortical atrophy is noted. Moderate chronic ischemic white matter disease is noted. Stable calcifications are noted in the basal ganglia bilaterally. No mass effect or midline shift is noted. Ventricular size is within normal limits. There is no evidence of mass lesion, hemorrhage or acute infarction.   IMPRESSION: Mild diffuse cortical atrophy. Moderate chronic ischemic white matter disease. No acute intracranial abnormality  seen.   CT HEAD WITHOUT CONTRAST   TECHNIQUE: Contiguous axial images were obtained from the base of the skull through the vertex without intravenous contrast.   COMPARISON:  08/04/2014   FINDINGS: Diffuse cerebral atrophy. Ventricular dilatation consistent with central atrophy. Low-attenuation changes throughout the deep white matter consistent with small vessel ischemia. Old lacunar infarcts in the right caudate lobe, left thalamus, and left cerebellum. No change since previous study. No mass effect or midline shift. No abnormal extra-axial fluid collections. Gray-white matter junctions are distinct. Basal cisterns are not  effaced. No evidence of acute intracranial hemorrhage. No depressed skull fractures. Small retention cyst in the sphenoid sinus. Mastoid air cells are not opacified.   IMPRESSION: No acute intracranial abnormalities. Chronic atrophy and small vessel ischemic changes with multiple old lacunar infarcts.   PORTABLE CHEST - 1 VIEW   COMPARISON:  07/29/2014   FINDINGS: Probable linear atelectasis in the left mid lung. Normal heart size and pulmonary vascularity. No focal airspace disease in the lungs. No blunting of costophrenic angles. No pneumothorax. Mediastinal contours appear intact.   IMPRESSION: No active disease.   MRI HEAD WITHOUT CONTRAST   MRA HEAD WITHOUT CONTRAST   TECHNIQUE: Multiplanar, multiecho pulse sequences of the brain and surrounding structures were obtained without intravenous contrast. Angiographic images of the head were obtained using MRA technique without contrast.   COMPARISON:  CT head 08/06/2014.  MR head 04/24/14   FINDINGS: MRI HEAD FINDINGS   Subcentimeter acute infarct in the LEFT corpus callosum just lateral and superior to the genu. This lies within the LEFT ACA territory. No other areas of acute infarction are identified. No hemorrhage, mass lesion, or extra-axial fluid.   Hydrocephalus ex vacuo. Extensive small vessel disease throughout the periventricular and subcortical white matter. Widespread remote basal ganglia, thalamic, and deep white matter infarcts some of which were acute on the previous study from June. Partial empty sella. Cervical spondylosis. No osseous lesions. BILATERAL cataract extraction. Chronic sinus and mastoid fluid.   MRA HEAD FINDINGS   Misregistration of the process images precludes accurate stenosis evaluation. There is gross patency of the internal carotid arteries and basilar artery with RIGHT vertebral dominant. Suspected moderate RIGHT cavernous ICA disease and mild LEFT cavernous ICA  disease suspected mild disease of the distal LEFT vertebral. There is no proximal flow reducing lesion of the anterior, middle, or posterior cerebral arteries. Both distal anterior cerebral arteries appear patent.   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   Advanced atrophy, small vessel disease, and widespread remote deep nuclei and deep white matter infarcts.   Patient motion results in misregistration of the MRA exam. Gross patency of the intracranial vasculature, including both anterior cerebral arteries, is established however.   CT ABDOMEN WITHOUT CONTRAST   TECHNIQUE: Multidetector CT imaging of the abdomen was performed following the standard protocol without IV contrast.   COMPARISON:  10/27/2010; abdominal radiograph - 07/30/2014   FINDINGS: The lack of intravenous contrast limits the ability to evaluate solid abdominal organs.   A minimal amount of enteric contrast is seen within the colon. The splenic flexure of the colon is interposed between the anterior abdominal wall and the ventral wall of the stomach (representative coronal image 12, series 206, axial image 29, series 21).   The bowel is normal in course and caliber without wall thickening. Normal noncontrast appearance of the appendix. No pneumoperitoneum, pneumatosis or portal venous gas.   Normal hepatic contour. There are  multiple radiopaque gallstones lying dependently within the gallbladder.   Normal noncontrast appearance of the bilateral kidneys. No radiopaque gallstones. There is a minimal amount of grossly symmetric likely age and body habitus related perinephric stranding. No urinary obstruction. There is mild diffuse thickening of the left adrenal gland without discrete nodule. Normal noncontrast appearance of the right adrenal gland, pancreas and spleen.   Atherosclerotic plaque within a normal caliber abdominal aorta. No bulky  retroperitoneal or mesenteric lymphadenopathy on this noncontrast examination.   Limited visualization of the lower thorax demonstrates trace bilateral effusions with associated bibasilar opacities, likely atelectasis.   Borderline cardiomegaly. Coronary artery calcifications. No pericardial effusion.   No acute or aggressive osseus abnormalities. There is mild multilevel DDD within the lower lumbar spine thoracic spine. There is mild (approximately 7 mm) of anterolisthesis of L4 upon L5 without associated pars defect. Bilateral facet degenerative change in the lower lumbar spine.   There is diffuse body wall anasarca.   IMPRESSION: 1. The transverse colon overlies the stomach and as such, would recommend either enteric administration of barium versus a retrograde enema prior to consideration of attempted percutaneous gastrostomy tube placement. 2. Small bilateral effusions with body wall anasarca - constellation of findings worrisome for pulmonary edema.    ASSESSMENT/PLAN:  CVA-continue Aggrenox Diabetes mellitus-continue Lantus. monitor CBGs. Hypothyroidism-well-controlled Hyperlipidemia-continue Lipitor Chronic kidney disease stage III-recheck renal functions COPD-compensated Renovascular hypertension-blood pressure borderline. will monitor. Check CBC and BMP  I have reviewed patient's medical records received at admission/from hospitalization.  CPT CODE: 16109  Angela Cox, MD [facility] [phone]

## 2014-08-26 ENCOUNTER — Encounter (HOSPITAL_COMMUNITY): Payer: Self-pay | Admitting: Emergency Medicine

## 2014-08-26 ENCOUNTER — Inpatient Hospital Stay (HOSPITAL_COMMUNITY)
Admission: EM | Admit: 2014-08-26 | Discharge: 2014-08-29 | DRG: 179 | Disposition: A | Discharge status: Skilled Nursing Facility | Payer: Medicare HMO | Attending: Internal Medicine | Admitting: Internal Medicine

## 2014-08-26 ENCOUNTER — Non-Acute Institutional Stay (SKILLED_NURSING_FACILITY): Payer: Commercial Managed Care - HMO | Admitting: Internal Medicine

## 2014-08-26 DIAGNOSIS — E039 Hypothyroidism, unspecified: Secondary | ICD-10-CM | POA: Diagnosis present

## 2014-08-26 DIAGNOSIS — I10 Essential (primary) hypertension: Secondary | ICD-10-CM | POA: Diagnosis present

## 2014-08-26 DIAGNOSIS — Y95 Nosocomial condition: Secondary | ICD-10-CM | POA: Diagnosis present

## 2014-08-26 DIAGNOSIS — E860 Dehydration: Secondary | ICD-10-CM | POA: Diagnosis present

## 2014-08-26 DIAGNOSIS — R1313 Dysphagia, pharyngeal phase: Secondary | ICD-10-CM | POA: Diagnosis present

## 2014-08-26 DIAGNOSIS — D649 Anemia, unspecified: Secondary | ICD-10-CM | POA: Diagnosis present

## 2014-08-26 DIAGNOSIS — J690 Pneumonitis due to inhalation of food and vomit: Principal | ICD-10-CM | POA: Diagnosis present

## 2014-08-26 DIAGNOSIS — E1122 Type 2 diabetes mellitus with diabetic chronic kidney disease: Secondary | ICD-10-CM | POA: Diagnosis present

## 2014-08-26 DIAGNOSIS — J9601 Acute respiratory failure with hypoxia: Secondary | ICD-10-CM

## 2014-08-26 DIAGNOSIS — I69320 Aphasia following cerebral infarction: Secondary | ICD-10-CM

## 2014-08-26 DIAGNOSIS — Z8673 Personal history of transient ischemic attack (TIA), and cerebral infarction without residual deficits: Secondary | ICD-10-CM

## 2014-08-26 DIAGNOSIS — R0600 Dyspnea, unspecified: Secondary | ICD-10-CM

## 2014-08-26 DIAGNOSIS — E785 Hyperlipidemia, unspecified: Secondary | ICD-10-CM | POA: Diagnosis present

## 2014-08-26 DIAGNOSIS — E870 Hyperosmolality and hypernatremia: Secondary | ICD-10-CM

## 2014-08-26 DIAGNOSIS — D631 Anemia in chronic kidney disease: Secondary | ICD-10-CM

## 2014-08-26 DIAGNOSIS — Z87891 Personal history of nicotine dependence: Secondary | ICD-10-CM

## 2014-08-26 DIAGNOSIS — I129 Hypertensive chronic kidney disease with stage 1 through stage 4 chronic kidney disease, or unspecified chronic kidney disease: Secondary | ICD-10-CM | POA: Diagnosis present

## 2014-08-26 DIAGNOSIS — N183 Chronic kidney disease, stage 3 unspecified: Secondary | ICD-10-CM

## 2014-08-26 DIAGNOSIS — Z794 Long term (current) use of insulin: Secondary | ICD-10-CM

## 2014-08-26 DIAGNOSIS — N189 Chronic kidney disease, unspecified: Secondary | ICD-10-CM

## 2014-08-26 DIAGNOSIS — R0602 Shortness of breath: Secondary | ICD-10-CM | POA: Diagnosis not present

## 2014-08-26 DIAGNOSIS — J189 Pneumonia, unspecified organism: Secondary | ICD-10-CM | POA: Diagnosis present

## 2014-08-26 DIAGNOSIS — Z79899 Other long term (current) drug therapy: Secondary | ICD-10-CM

## 2014-08-26 DIAGNOSIS — E11649 Type 2 diabetes mellitus with hypoglycemia without coma: Secondary | ICD-10-CM | POA: Diagnosis present

## 2014-08-26 DIAGNOSIS — E1129 Type 2 diabetes mellitus with other diabetic kidney complication: Secondary | ICD-10-CM

## 2014-08-26 DIAGNOSIS — I2510 Atherosclerotic heart disease of native coronary artery without angina pectoris: Secondary | ICD-10-CM | POA: Diagnosis present

## 2014-08-26 DIAGNOSIS — F05 Delirium due to known physiological condition: Secondary | ICD-10-CM

## 2014-08-26 DIAGNOSIS — N289 Disorder of kidney and ureter, unspecified: Secondary | ICD-10-CM

## 2014-08-26 DIAGNOSIS — IMO0002 Reserved for concepts with insufficient information to code with codable children: Secondary | ICD-10-CM

## 2014-08-26 DIAGNOSIS — Z66 Do not resuscitate: Secondary | ICD-10-CM | POA: Diagnosis present

## 2014-08-26 DIAGNOSIS — J9602 Acute respiratory failure with hypercapnia: Secondary | ICD-10-CM

## 2014-08-26 DIAGNOSIS — E1165 Type 2 diabetes mellitus with hyperglycemia: Secondary | ICD-10-CM | POA: Diagnosis present

## 2014-08-26 HISTORY — DX: Shortness of breath: R06.02

## 2014-08-26 NOTE — ED Notes (Signed)
Per EMS[facility] well.  Pt unable to state what is wrong with her. EMS called out for respiratory distress; pt not reporting any shortness of breath with EMS.  Pt was being treated for dehydration at facility and was receiving 2L of NS when IV became infiltrated; Edema to right hand.

## 2014-08-26 NOTE — ED Notes (Signed)
Weeping noted to right hand where previous IV infiltrated at facility.

## 2014-08-27 ENCOUNTER — Inpatient Hospital Stay (HOSPITAL_COMMUNITY): Payer: Medicare HMO

## 2014-08-27 ENCOUNTER — Emergency Department (HOSPITAL_COMMUNITY): Payer: Medicare HMO

## 2014-08-27 ENCOUNTER — Encounter (HOSPITAL_COMMUNITY): Payer: Self-pay | Admitting: Internal Medicine

## 2014-08-27 DIAGNOSIS — D649 Anemia, unspecified: Secondary | ICD-10-CM | POA: Diagnosis present

## 2014-08-27 DIAGNOSIS — N183 Chronic kidney disease, stage 3 (moderate): Secondary | ICD-10-CM

## 2014-08-27 DIAGNOSIS — J9601 Acute respiratory failure with hypoxia: Secondary | ICD-10-CM

## 2014-08-27 DIAGNOSIS — I129 Hypertensive chronic kidney disease with stage 1 through stage 4 chronic kidney disease, or unspecified chronic kidney disease: Secondary | ICD-10-CM | POA: Diagnosis present

## 2014-08-27 DIAGNOSIS — Z794 Long term (current) use of insulin: Secondary | ICD-10-CM | POA: Diagnosis not present

## 2014-08-27 DIAGNOSIS — E039 Hypothyroidism, unspecified: Secondary | ICD-10-CM | POA: Diagnosis present

## 2014-08-27 DIAGNOSIS — E1122 Type 2 diabetes mellitus with diabetic chronic kidney disease: Secondary | ICD-10-CM | POA: Diagnosis present

## 2014-08-27 DIAGNOSIS — Z87891 Personal history of nicotine dependence: Secondary | ICD-10-CM | POA: Diagnosis not present

## 2014-08-27 DIAGNOSIS — J690 Pneumonitis due to inhalation of food and vomit: Secondary | ICD-10-CM | POA: Diagnosis present

## 2014-08-27 DIAGNOSIS — I2510 Atherosclerotic heart disease of native coronary artery without angina pectoris: Secondary | ICD-10-CM | POA: Diagnosis present

## 2014-08-27 DIAGNOSIS — R1313 Dysphagia, pharyngeal phase: Secondary | ICD-10-CM | POA: Diagnosis present

## 2014-08-27 DIAGNOSIS — Z66 Do not resuscitate: Secondary | ICD-10-CM | POA: Diagnosis present

## 2014-08-27 DIAGNOSIS — E1129 Type 2 diabetes mellitus with other diabetic kidney complication: Secondary | ICD-10-CM

## 2014-08-27 DIAGNOSIS — Z79899 Other long term (current) drug therapy: Secondary | ICD-10-CM | POA: Diagnosis not present

## 2014-08-27 DIAGNOSIS — Y95 Nosocomial condition: Secondary | ICD-10-CM | POA: Diagnosis present

## 2014-08-27 DIAGNOSIS — J189 Pneumonia, unspecified organism: Secondary | ICD-10-CM

## 2014-08-27 DIAGNOSIS — R0602 Shortness of breath: Secondary | ICD-10-CM | POA: Diagnosis present

## 2014-08-27 DIAGNOSIS — E785 Hyperlipidemia, unspecified: Secondary | ICD-10-CM | POA: Diagnosis present

## 2014-08-27 DIAGNOSIS — E1165 Type 2 diabetes mellitus with hyperglycemia: Secondary | ICD-10-CM

## 2014-08-27 DIAGNOSIS — E860 Dehydration: Secondary | ICD-10-CM | POA: Diagnosis present

## 2014-08-27 DIAGNOSIS — E11649 Type 2 diabetes mellitus with hypoglycemia without coma: Secondary | ICD-10-CM | POA: Diagnosis present

## 2014-08-27 DIAGNOSIS — Z8673 Personal history of transient ischemic attack (TIA), and cerebral infarction without residual deficits: Secondary | ICD-10-CM

## 2014-08-27 DIAGNOSIS — I69320 Aphasia following cerebral infarction: Secondary | ICD-10-CM | POA: Diagnosis not present

## 2014-08-27 LAB — CBC
HEMATOCRIT: 26.7 % — AB (ref 36.0–46.0)
Hemoglobin: 8.5 g/dL — ABNORMAL LOW (ref 12.0–15.0)
MCH: 29.8 pg (ref 26.0–34.0)
MCHC: 31.8 g/dL (ref 30.0–36.0)
MCV: 93.7 fL (ref 78.0–100.0)
Platelets: 214 10*3/uL (ref 150–400)
RBC: 2.85 MIL/uL — AB (ref 3.87–5.11)
RDW: 14.5 % (ref 11.5–15.5)
WBC: 4.3 10*3/uL (ref 4.0–10.5)

## 2014-08-27 LAB — URINALYSIS, ROUTINE W REFLEX MICROSCOPIC
Bilirubin Urine: NEGATIVE
GLUCOSE, UA: NEGATIVE mg/dL
HGB URINE DIPSTICK: NEGATIVE
Ketones, ur: NEGATIVE mg/dL
Nitrite: NEGATIVE
PH: 5 (ref 5.0–8.0)
Protein, ur: 100 mg/dL — AB
Specific Gravity, Urine: 1.02 (ref 1.005–1.030)
Urobilinogen, UA: 0.2 mg/dL (ref 0.0–1.0)

## 2014-08-27 LAB — I-STAT ARTERIAL BLOOD GAS, ED
Acid-Base Excess: 2 mmol/L (ref 0.0–2.0)
Bicarbonate: 27.1 mEq/L — ABNORMAL HIGH (ref 20.0–24.0)
O2 Saturation: 95 %
PCO2 ART: 46 mmHg — AB (ref 35.0–45.0)
PH ART: 7.378 (ref 7.350–7.450)
TCO2: 28 mmol/L (ref 0–100)
pO2, Arterial: 77 mmHg — ABNORMAL LOW (ref 80.0–100.0)

## 2014-08-27 LAB — CBC WITH DIFFERENTIAL/PLATELET
BASOS PCT: 0 % (ref 0–1)
Basophils Absolute: 0 10*3/uL (ref 0.0–0.1)
EOS ABS: 0 10*3/uL (ref 0.0–0.7)
EOS PCT: 0 % (ref 0–5)
HEMATOCRIT: 25.4 % — AB (ref 36.0–46.0)
Hemoglobin: 8.2 g/dL — ABNORMAL LOW (ref 12.0–15.0)
Lymphocytes Relative: 26 % (ref 12–46)
Lymphs Abs: 1.1 10*3/uL (ref 0.7–4.0)
MCH: 29.6 pg (ref 26.0–34.0)
MCHC: 32.3 g/dL (ref 30.0–36.0)
MCV: 91.7 fL (ref 78.0–100.0)
MONO ABS: 0.5 10*3/uL (ref 0.1–1.0)
MONOS PCT: 11 % (ref 3–12)
Neutro Abs: 2.7 10*3/uL (ref 1.7–7.7)
Neutrophils Relative %: 63 % (ref 43–77)
Platelets: 208 10*3/uL (ref 150–400)
RBC: 2.77 MIL/uL — ABNORMAL LOW (ref 3.87–5.11)
RDW: 14.5 % (ref 11.5–15.5)
WBC: 4.2 10*3/uL (ref 4.0–10.5)

## 2014-08-27 LAB — COMPREHENSIVE METABOLIC PANEL
ALBUMIN: 2.3 g/dL — AB (ref 3.5–5.2)
ALBUMIN: 2.4 g/dL — AB (ref 3.5–5.2)
ALK PHOS: 63 U/L (ref 39–117)
ALT: 10 U/L (ref 0–35)
ALT: 9 U/L (ref 0–35)
AST: 12 U/L (ref 0–37)
AST: 14 U/L (ref 0–37)
Alkaline Phosphatase: 71 U/L (ref 39–117)
Anion gap: 9 (ref 5–15)
Anion gap: 9 (ref 5–15)
BUN: 15 mg/dL (ref 6–23)
BUN: 15 mg/dL (ref 6–23)
CHLORIDE: 109 meq/L (ref 96–112)
CHLORIDE: 110 meq/L (ref 96–112)
CO2: 25 meq/L (ref 19–32)
CO2: 26 mEq/L (ref 19–32)
Calcium: 8.5 mg/dL (ref 8.4–10.5)
Calcium: 8.5 mg/dL (ref 8.4–10.5)
Creatinine, Ser: 1.51 mg/dL — ABNORMAL HIGH (ref 0.50–1.10)
Creatinine, Ser: 1.48 mg/dL — ABNORMAL HIGH (ref 0.50–1.10)
GFR calc Af Amer: 38 mL/min — ABNORMAL LOW (ref >90)
GFR calc Af Amer: 39 mL/min — ABNORMAL LOW (ref >90)
GFR calc non Af Amer: 33 mL/min — ABNORMAL LOW (ref >90)
GFR, EST NON AFRICAN AMERICAN: 33 mL/min — AB (ref >90)
GLUCOSE: 227 mg/dL — AB (ref 70–99)
Glucose, Bld: 209 mg/dL — ABNORMAL HIGH (ref 70–99)
POTASSIUM: 4.6 meq/L (ref 3.7–5.3)
Potassium: 4.2 mEq/L (ref 3.7–5.3)
SODIUM: 145 meq/L (ref 137–147)
Sodium: 143 mEq/L (ref 137–147)
Total Bilirubin: <0.2 mg/dL — ABNORMAL LOW (ref 0.3–1.2)
Total Protein: 5.8 g/dL — ABNORMAL LOW (ref 6.0–8.3)
Total Protein: 5.6 g/dL — ABNORMAL LOW (ref 6.0–8.3)

## 2014-08-27 LAB — GLUCOSE, CAPILLARY
Glucose-Capillary: 200 mg/dL — ABNORMAL HIGH (ref 70–99)
Glucose-Capillary: 165 mg/dL — ABNORMAL HIGH (ref 70–99)
Glucose-Capillary: 139 mg/dL — ABNORMAL HIGH (ref 70–99)
Glucose-Capillary: 157 mg/dL — ABNORMAL HIGH (ref 70–99)

## 2014-08-27 LAB — URINE MICROSCOPIC-ADD ON

## 2014-08-27 LAB — I-STAT TROPONIN, ED: Troponin i, poc: 0.07 ng/mL (ref 0.00–0.08)

## 2014-08-27 LAB — MRSA PCR SCREENING: MRSA by PCR: NEGATIVE

## 2014-08-27 LAB — I-STAT CG4 LACTIC ACID, ED: LACTIC ACID, VENOUS: 0.35 mmol/L — AB (ref 0.5–2.2)

## 2014-08-27 LAB — CBG MONITORING, ED: Glucose-Capillary: 198 mg/dL — ABNORMAL HIGH (ref 70–99)

## 2014-08-27 MED ORDER — ACETAMINOPHEN 325 MG PO TABS
650.0000 mg | ORAL_TABLET | Freq: Four times a day (QID) | ORAL | Status: DC | PRN
  Administered 2014-08-28 – 2014-08-29 (×2): 650 mg via ORAL
  Filled 2014-08-27 (×2): qty 2

## 2014-08-27 MED ORDER — ENOXAPARIN SODIUM 40 MG/0.4ML ~~LOC~~ SOLN
40.0000 mg | SUBCUTANEOUS | Status: DC
  Administered 2014-08-27 – 2014-08-29 (×3): 40 mg via SUBCUTANEOUS
  Filled 2014-08-27 (×3): qty 0.4

## 2014-08-27 MED ORDER — ENSURE PUDDING PO PUDG
1.0000 | Freq: Two times a day (BID) | ORAL | Status: DC
  Administered 2014-08-28 – 2014-08-29 (×4): 1 via ORAL

## 2014-08-27 MED ORDER — BUDESONIDE-FORMOTEROL FUMARATE 80-4.5 MCG/ACT IN AERO
2.0000 | INHALATION_SPRAY | Freq: Two times a day (BID) | RESPIRATORY_TRACT | Status: DC
  Administered 2014-08-27 – 2014-08-29 (×4): 2 via RESPIRATORY_TRACT
  Filled 2014-08-27: qty 6.9

## 2014-08-27 MED ORDER — IPRATROPIUM-ALBUTEROL 0.5-2.5 (3) MG/3ML IN SOLN: 3.0000 mL | RESPIRATORY_TRACT | Status: DC

## 2014-08-27 MED ORDER — ATORVASTATIN CALCIUM 40 MG PO TABS
40.0000 mg | ORAL_TABLET | Freq: Every day | ORAL | Status: DC
  Administered 2014-08-28 – 2014-08-29 (×2): 40 mg via ORAL
  Filled 2014-08-27 (×3): qty 1

## 2014-08-27 MED ORDER — IPRATROPIUM-ALBUTEROL 0.5-2.5 (3) MG/3ML IN SOLN
3.0000 mL | RESPIRATORY_TRACT | Status: DC
  Administered 2014-08-27 (×4): 3 mL via RESPIRATORY_TRACT
  Filled 2014-08-27 (×4): qty 3

## 2014-08-27 MED ORDER — HYDRALAZINE HCL 50 MG PO TABS
100.0000 mg | ORAL_TABLET | Freq: Three times a day (TID) | ORAL | Status: DC
  Filled 2014-08-27 (×3): qty 2

## 2014-08-27 MED ORDER — LEVOTHYROXINE SODIUM 100 MCG IV SOLR
26.0000 ug | INTRAVENOUS | Status: DC
  Administered 2014-08-27: 26 ug via INTRAVENOUS
  Filled 2014-08-27 (×2): qty 5

## 2014-08-27 MED ORDER — ASPIRIN-DIPYRIDAMOLE ER 25-200 MG PO CP12
1.0000 | ORAL_CAPSULE | Freq: Two times a day (BID) | ORAL | Status: DC
  Administered 2014-08-28 – 2014-08-29 (×3): 1 via ORAL
  Filled 2014-08-27 (×6): qty 1

## 2014-08-27 MED ORDER — INSULIN GLARGINE 100 UNIT/ML ~~LOC~~ SOLN
13.0000 [IU] | Freq: Every day | SUBCUTANEOUS | Status: DC
  Administered 2014-08-27: 13 [IU] via SUBCUTANEOUS
  Filled 2014-08-27 (×2): qty 0.13

## 2014-08-27 MED ORDER — ADULT MULTIVITAMIN W/MINERALS CH
1.0000 | ORAL_TABLET | Freq: Every day | ORAL | Status: DC
  Administered 2014-08-28 – 2014-08-29 (×2): 1 via ORAL
  Filled 2014-08-27 (×3): qty 1

## 2014-08-27 MED ORDER — METOPROLOL TARTRATE 1 MG/ML IV SOLN: 5.0000 mg | INTRAVENOUS | Status: DC | PRN

## 2014-08-27 MED ORDER — ACETAMINOPHEN 650 MG RE SUPP: 650.0000 mg | Freq: Four times a day (QID) | RECTAL | Status: DC | PRN

## 2014-08-27 MED ORDER — ALBUTEROL SULFATE (2.5 MG/3ML) 0.083% IN NEBU: 2.5000 mg | INHALATION_SOLUTION | Freq: Four times a day (QID) | RESPIRATORY_TRACT | Status: DC | PRN

## 2014-08-27 MED ORDER — SENNOSIDES-DOCUSATE SODIUM 8.6-50 MG PO TABS
1.0000 | ORAL_TABLET | Freq: Every evening | ORAL | Status: DC | PRN
  Filled 2014-08-27: qty 1

## 2014-08-27 MED ORDER — SODIUM CHLORIDE 0.9 % IV SOLN: 25.0000 ug | INTRAVENOUS | Status: DC

## 2014-08-27 MED ORDER — VANCOMYCIN HCL IN DEXTROSE 1-5 GM/200ML-% IV SOLN
1000.0000 mg | INTRAVENOUS | Status: DC
  Administered 2014-08-28 – 2014-08-29 (×2): 1000 mg via INTRAVENOUS
  Filled 2014-08-27 (×3): qty 200

## 2014-08-27 MED ORDER — LEVOTHYROXINE SODIUM 50 MCG PO TABS
50.0000 ug | ORAL_TABLET | Freq: Every day | ORAL | Status: DC
  Administered 2014-08-27: 50 ug via ORAL
  Filled 2014-08-27 (×2): qty 1

## 2014-08-27 MED ORDER — CARVEDILOL 25 MG PO TABS
25.0000 mg | ORAL_TABLET | Freq: Two times a day (BID) | ORAL | Status: DC
  Filled 2014-08-27 (×3): qty 1

## 2014-08-27 MED ORDER — ONDANSETRON HCL 4 MG/2ML IJ SOLN: 4.0000 mg | Freq: Four times a day (QID) | INTRAMUSCULAR | Status: DC | PRN

## 2014-08-27 MED ORDER — HYDRALAZINE HCL 20 MG/ML IJ SOLN: 10.0000 mg | Freq: Four times a day (QID) | INTRAMUSCULAR | Status: DC | PRN

## 2014-08-27 MED ORDER — SODIUM CHLORIDE 0.9 % IV SOLN
INTRAVENOUS | Status: DC
  Administered 2014-08-27 – 2014-08-28 (×2): via INTRAVENOUS

## 2014-08-27 MED ORDER — CLONIDINE HCL 0.3 MG/24HR TD PTWK
0.3000 mg | MEDICATED_PATCH | TRANSDERMAL | Status: DC
  Administered 2014-08-28: 0.3 mg via TRANSDERMAL
  Filled 2014-08-27: qty 1

## 2014-08-27 MED ORDER — RESOURCE THICKENUP CLEAR PO POWD
ORAL | Status: DC | PRN
  Filled 2014-08-27: qty 125

## 2014-08-27 MED ORDER — SODIUM CHLORIDE 0.9 % IJ SOLN
10.0000 mL | INTRAMUSCULAR | Status: DC | PRN
  Administered 2014-08-29: 10 mL

## 2014-08-27 MED ORDER — SODIUM CHLORIDE 0.9 % IJ SOLN
3.0000 mL | Freq: Two times a day (BID) | INTRAMUSCULAR | Status: DC
  Administered 2014-08-27 – 2014-08-28 (×3): 3 mL via INTRAVENOUS

## 2014-08-27 MED ORDER — DEXTROSE 5 % IV SOLN
1.0000 g | INTRAVENOUS | Status: DC
  Administered 2014-08-27 – 2014-08-29 (×3): 1 g via INTRAVENOUS
  Filled 2014-08-27 (×3): qty 1

## 2014-08-27 MED ORDER — DEXTROSE 5 % IV SOLN
2.0000 g | Freq: Once | INTRAVENOUS | Status: AC
  Administered 2014-08-27: 2 g via INTRAVENOUS
  Filled 2014-08-27: qty 2

## 2014-08-27 MED ORDER — ONDANSETRON HCL 4 MG PO TABS: 4.0000 mg | ORAL_TABLET | Freq: Four times a day (QID) | ORAL | Status: DC | PRN

## 2014-08-27 MED ORDER — VANCOMYCIN HCL IN DEXTROSE 1-5 GM/200ML-% IV SOLN
1000.0000 mg | Freq: Once | INTRAVENOUS | Status: AC
  Administered 2014-08-27: 1000 mg via INTRAVENOUS
  Filled 2014-08-27: qty 200

## 2014-08-27 MED ORDER — INSULIN ASPART 100 UNIT/ML ~~LOC~~ SOLN
0.0000 [IU] | Freq: Three times a day (TID) | SUBCUTANEOUS | Status: DC
Start: 2014-08-27 — End: 2014-08-29
  Administered 2014-08-27: 2 [IU] via SUBCUTANEOUS
  Administered 2014-08-28: 1 [IU] via SUBCUTANEOUS
  Administered 2014-08-29: 2 [IU] via SUBCUTANEOUS
  Administered 2014-08-29: 3 [IU] via SUBCUTANEOUS
  Administered 2014-08-29: 5 [IU] via SUBCUTANEOUS

## 2014-08-27 MED ORDER — ISOSORBIDE DINITRATE 10 MG PO TABS
10.0000 mg | ORAL_TABLET | Freq: Three times a day (TID) | ORAL | Status: DC
  Administered 2014-08-28 – 2014-08-29 (×5): 10 mg via ORAL
  Filled 2014-08-27 (×9): qty 1

## 2014-08-27 MED ORDER — IPRATROPIUM-ALBUTEROL 0.5-2.5 (3) MG/3ML IN SOLN
3.0000 mL | Freq: Once | RESPIRATORY_TRACT | Status: AC
  Administered 2014-08-27: 3 mL via RESPIRATORY_TRACT
  Filled 2014-08-27: qty 3

## 2014-08-27 NOTE — H&P (Signed)
[facility] History and Physical  [name] [name] ZOX:[id number] DOB: 07/08/1941 DOA: 08/26/2014  Referring physician: ER physician. PCP: [name] Garnet., MD  Chief Complaint: Shortness of breath.  HPI: [name] [name] is a 73 y.o. female who was recently admitted for stroke with aphasia, diabetes mellitus, chronic kidney disease, hypothyroidism, hypertension was brought to the ER from the nursing home after patient was found to be having mild confusion and short of breath. Patient states that patient has been suddenly feeling short of breath last night with productive cough. In the ER chest x-ray shows features concerning for pneumonia and has been started the antibiotics. In the ER patient was found to be mildly wheezing and was placed on nebulizer. Patient during her recent admission for stroke was having difficulty swallowing and initially PEG tube placement was planned but eventually patient mental status improved and patient was placed on dysphagia one diet. Patient denies any chest pain abdominal pain nausea vomiting or diarrhea.   Review of Systems: As presented in the history of presenting illness, rest negative.  Past Medical History  Diagnosis Date  . Hypertension   . Diabetes mellitus   . Chronic renal insufficiency   . Hyperlipidemia   . Osteopenia   . CVA (cerebral infarction)   . Thyroid disease    Past Surgical History  Procedure Laterality Date  . Eye surgery    . Loop recorder implant  03-24-2014    MDT LinQ implanted by Dr [name] [name] for cryptogenic stroke  . Tee without cardioversion N/A 03/23/2014    Procedure: TRANSESOPHAGEAL ECHOCARDIOGRAM (TEE);  Surgeon: [name] Masson, MD;  Location: [facility] ENDOSCOPY;  Service: Cardiovascular;  Laterality: N/A;   Social History:  reports that she quit smoking about 2 months ago. Her smoking use included Cigarettes. She has a 30 pack-year smoking history. She has never used smokeless tobacco. She reports that she drinks  alcohol. She reports that she does not use illicit drugs. Where does patient live nursing home. Can patient participate in ADLs? Not sure.  Allergies  Allergen Reactions  . Erythromycin Shortness Of Breath  . Iodine Hives  . Penicillins Hives  . Latex Hives and Itching    Family History:  Family History  Problem Relation Age of Onset  . Diabetes Mother   . Hypertension Mother       Prior to Admission medications   Medication Sig Start Date End Date Taking? Authorizing Provider  acetaminophen (TYLENOL) 325 MG tablet Take 650 mg by mouth every 6 (six) hours as needed for mild pain.   Yes Historical Provider, MD  albuterol (PROVENTIL) (2.5 MG/3ML) 0.083% nebulizer solution Take 2.5 mg by nebulization every 6 (six) hours as needed for wheezing or shortness of breath.   Yes Historical Provider, MD  atorvastatin (LIPITOR) 40 MG tablet Take 40 mg by mouth daily.   Yes Historical Provider, MD  budesonide-formoterol (SYMBICORT) 80-4.5 MCG/ACT inhaler Inhale 2 puffs into the lungs 2 (two) times daily.   Yes Historical Provider, MD  carvedilol (COREG) 25 MG tablet Take 25 mg by mouth 2 (two) times daily with a meal.   Yes Historical Provider, MD  cloNIDine (CATAPRES - DOSED IN MG/24 HR) 0.3 mg/24hr patch Place 0.3 mg onto the skin once a week. On friday   Yes Historical Provider, MD  dipyridamole-aspirin (AGGRENOX) 200-25 MG per 12 hr capsule Take 1 capsule by mouth 2 (two) times daily. 08/20/14  Yes [name] Bennett, MD  hydrALAZINE (APRESOLINE) 100 MG tablet Take 100 mg by mouth  3 (three) times daily.   Yes Historical Provider, MD  insulin aspart (NOVOLOG) 100 UNIT/ML injection Inject 0-15 Units into the skin 4 (four) times daily -  before meals and at bedtime. 70-200= 0 units; 201-250= 2 units; 251-300= 4 units; 301-350= 6 units; 351-400= 8 units; >400 give 10 units and recheck CBG in 2 hours. If CBG continues >400= call MD   Yes Historical Provider, MD  insulin glargine (LANTUS) 100 UNIT/ML  injection Inject 13 Units into the skin at bedtime.   Yes Historical Provider, MD  isosorbide dinitrate (ISORDIL) 10 MG tablet Take 10 mg by mouth 3 (three) times daily.   Yes Historical Provider, MD  levothyroxine (SYNTHROID, LEVOTHROID) 50 MCG tablet Take 50 mcg by mouth daily before breakfast.   Yes Historical Provider, MD  senna-docusate (SENOKOT-S) 8.6-50 MG per tablet Take 1 tablet by mouth at bedtime as needed for mild constipation. 08/20/14  Yes [name] BennettEzequiel Zamora, MD    Physical Exam: Filed Vitals:   08/27/14 0300 08/27/14 0315 08/27/14 0330 08/27/14 0410  BP: 142/50 143/63 140/51 142/51  Pulse: 71 70 71 78  Temp:    98.6 F (37 C)  TempSrc:    Axillary  Resp: 29 29 24 22   SpO2: 100% 100% 100% 100%     General:  Well-developed and nourished.  Eyes: Anicteric no pallor.  ENT: No discharge from ears eyes nose mouth.  Neck: No mass felt.  Cardiovascular: S1-S2 heard.  Respiratory: No rhonchi or crepitations.  Abdomen: Soft nontender bowel sounds present. No guarding or rigidity.  Skin: No rash.  Musculoskeletal: No edema.  Psychiatric: Appears normal.  Neurologic: Alert awake oriented to time place and person. Moves all extremities.  Labs on Admission:  Basic Metabolic Panel:  Recent Labs Lab 08/20/14 0555 08/27/14 0011  NA 149* 143  K 3.7 4.2  CL 112 109  CO2 28 25  GLUCOSE 172* 209*  BUN 14 15  CREATININE 1.14* 1.51*  CALCIUM 8.7 8.5   Liver Function Tests:  Recent Labs Lab 08/27/14 0011  AST 14  ALT 10  ALKPHOS 71  BILITOT <0.2*  PROT 5.8*  ALBUMIN 2.4*   No results found for this basename: LIPASE, AMYLASE,  in the last 168 hours No results found for this basename: AMMONIA,  in the last 168 hours CBC:  Recent Labs Lab 08/27/14 0011  WBC 4.3  HGB 8.5*  HCT 26.7*  MCV 93.7  PLT 214   Cardiac Enzymes: No results found for this basename: CKTOTAL, CKMB, CKMBINDEX, TROPONINI,  in the last 168 hours  BNP (last 3 results)  Recent  Labs  06/09/14 1344 06/12/14 1330 07/24/14 1000  PROBNP 1271.0* 1666.0* 6064.0*   CBG:  Recent Labs Lab 08/20/14 0840 08/20/14 1151 08/27/14 0002  GLUCAP 173* 210* 198*    Radiological Exams on Admission: Dg Chest Port 1 View  08/27/2014   CLINICAL DATA:  73 year old female with acute shortness of Breath. Initial encounter. Current history of recent infarct.  EXAM: PORTABLE CHEST - 1 VIEW  COMPARISON:  08/06/2014 and earlier.  FINDINGS: Portable AP semi upright view at 0014 hr. New retrocardiac opacity obscuring the left hemidiaphragm. Stable cardiac size and mediastinal contours. Cardiac event recorder. Mild veiling opacity also at the right lung base. No pneumothorax or pulmonary edema.  IMPRESSION: New left greater than right lower lobe opacity, that on the left compatible with pneumonia or aspiration in this setting.   Electronically Signed   By: Augusto GambleLee  Hall M.D.   On:  08/27/2014 00:24     Assessment/Plan Principal Problem:   HCAP (healthcare-associated pneumonia) Active Problems:   Hypertension   DM type 2, uncontrolled, with renal complications   Hypothyroidism   1. Pneumonia - aspiration was care associated pneumonia. At this time patient has been placed on vancomycin and cefepime. Continue dysphagia diet for now and I have requested speech therapist evaluation of patient's swallow. 2. COPD - patient has per the ER physician was initially wheezing in the ER but on my exam wheezing is improved. Patient is on nebulizer for now. 3. Diabetes mellitus type 2 - continue home medications. Patient is on Lantus. Closely follow CBGs. 4. Hypertension - continue home medications. 5. Recent CVA with aphasia - patient is able to speak but with difficulty. Continue Aggrenox. 6. Hypothyroidism - continue Synthroid. 7. Chronic kidney disease with mild worsening of renal function - closely follow metabolic panel. 8. Chronic anemia - follow CBC. 9. Hyperlipidemia - on statins.    Code  Status: DO NOT RESUSCITATE.  Family Communication: None.  Disposition Plan: Admit to inpatient.    Ajah Vanhoose N. [facility] Pager [phone].  If 7PM-7AM, please contact night-coverage [email or web address] Password [email or web address] 08/27/2014, 4:44 AM

## 2014-08-27 NOTE — Progress Notes (Signed)
PT extremely hard to arouse, Pt was unable to do symbicort. RT attempted to do with her, she wouldn't stay awake. Nebulizer treatment was done at this time. RT will continue to monitor.

## 2014-08-27 NOTE — Progress Notes (Signed)
[facility] Progress Note   [name] [name] XLK:[id number] DOB: 15-Aug-1941 DOA: 08/26/2014 PCP: Alva GarnetSHELTON,KIMBERLY R., MD  Brief narrative: [name] [name] is a 73 y.o. female with past medical history of recent CVA with a hazy, diabetes mellitus, CAD, hypothyroidism, hypertension he was brought to the hospital for confusion and shortness of breath. She is admitted for probable pneumonia with acute delirium.  Subjective: Patient is sleepy and poorly communicative.  Assessment/Plan: Principal Problem:   HCAP bilateral lower lower passageways noted on x-ray on admission -She's been placed on vancomycin and cefepime via IV-she has pulled out her IV and we're awaiting a PICC line to be placed before we can give her further dosages -Requiring 2 L of oxygen to keep her pulse ox above 90%  Active Problems: Lethargy/delirium -Possibly do to underlying acute infection-as she is barely eating, I will start her on slow IV fluids to help prevent dehydration -Try to switch her medications to IV form     Hypertension -Will switch Coreg to IV Lopressor as she is unable to take oral meds due to lethargy    DM type 2, uncontrolled, with renal complications -Continue Lantus and NovoLog    Hypothyroidism -Continue Synthroid   Code Status: DO NOT RESUSCITATE Family Communication: None Disposition Plan: Likely return to her nursing facility when stable DVT prophylaxis: Lovenox  Consultants: None  Procedures: None  Antibiotics: Anti-infectives   Start     Dose/Rate Route Frequency Ordered Stop   08/28/14 0400  vancomycin (VANCOCIN) IVPB 1000 mg/200 mL premix     1,000 mg 200 mL/hr over 60 Minutes Intravenous Every 24 hours 08/27/14 0504     08/27/14 0600  ceFEPIme (MAXIPIME) 1 g in dextrose 5 % 50 mL IVPB     1 g 100 mL/hr over 30 Minutes Intravenous Every 24 hours 08/27/14 0503     08/27/14 0245  vancomycin (VANCOCIN) IVPB 1000 mg/200 mL premix     1,000 mg 200 mL/hr over 60 Minutes  Intravenous  Once 08/27/14 0241 08/27/14 0430   08/27/14 0215  aztreonam (AZACTAM) 2 g in dextrose 5 % 50 mL IVPB     2 g 100 mL/hr over 30 Minutes Intravenous  Once 08/27/14 0212 08/27/14 0324         Objective: Filed Weights   08/27/14 0410  Weight: 75.8 kg (167 lb 1.7 oz)    Intake/Output Summary (Last 24 hours) at 08/27/14 1359 Last data filed at 08/27/14 1100  Gross per 24 hour  Intake    170 ml  Output      0 ml  Net    170 ml     Vitals Filed Vitals:   08/27/14 0410 08/27/14 0848 08/27/14 1225 08/27/14 1331  BP: 142/51   164/63  Pulse: 78   73  Temp: 98.6 F (37 C)   98.7 F (37.1 C)  TempSrc: Axillary   Axillary  Resp: 22   20  Height: 5\' 5"  (1.651 m)     Weight: 75.8 kg (167 lb 1.7 oz)     SpO2: 100% 99% 98% 95%    Exam: General: No acute respiratory distress-lethargic but opens eyes and tries to communicate-falls  asleep easily Lungs: Clear to auscultation bilaterally without wheezes or crackles Cardiovascular: Regular rate and rhythm without murmur gallop or rub normal S1 and S2 Abdomen: Nontender, nondistended, soft, bowel sounds positive, no rebound, no ascites, no appreciable mass Extremities: No significant cyanosis, clubbing, or edema bilateral lower extremities  Data Reviewed: Basic Metabolic  Panel:  Recent Labs Lab 08/27/14 0011 08/27/14 0609  NA 143 145  K 4.2 4.6  CL 109 110  CO2 25 26  GLUCOSE 209* 227*  BUN 15 15  CREATININE 1.51* 1.48*  CALCIUM 8.5 8.5   Liver Function Tests:  Recent Labs Lab 08/27/14 0011 08/27/14 0609  AST 14 12  ALT 10 9  ALKPHOS 71 63  BILITOT <0.2* <0.2*  PROT 5.8* 5.6*  ALBUMIN 2.4* 2.3*   No results found for this basename: LIPASE, AMYLASE,  in the last 168 hours No results found for this basename: AMMONIA,  in the last 168 hours CBC:  Recent Labs Lab 08/27/14 0011 08/27/14 0609  WBC 4.3 4.2  NEUTROABS  --  2.7  HGB 8.5* 8.2*  HCT 26.7* 25.4*  MCV 93.7 91.7  PLT 214 208   Cardiac  Enzymes: No results found for this basename: CKTOTAL, CKMB, CKMBINDEX, TROPONINI,  in the last 168 hours BNP (last 3 results)  Recent Labs  06/09/14 1344 06/12/14 1330 07/24/14 1000  PROBNP 1271.0* 1666.0* 6064.0*   CBG:  Recent Labs Lab 08/27/14 0002 08/27/14 0605 08/27/14 1134  GLUCAP 198* 200* 165*    Recent Results (from the past 240 hour(s))  MRSA PCR SCREENING     Status: None   Collection Time    08/27/14  4:03 AM      Result Value Ref Range Status   MRSA by PCR NEGATIVE  NEGATIVE Final   Comment:            The GeneXpert MRSA Assay (FDA     approved for NASAL specimens     only), is one component of a     comprehensive MRSA colonization     surveillance program. It is not     intended to diagnose MRSA     infection nor to guide or     monitor treatment for     MRSA infections.     Studies:  Recent x-ray studies have been reviewed in detail by the Attending Physician  Scheduled Meds:  Scheduled Meds: . atorvastatin  40 mg Oral Daily  . budesonide-formoterol  2 puff Inhalation BID  . carvedilol  25 mg Oral BID WC  . ceFEPime (MAXIPIME) IV  1 g Intravenous Q24H  . [START ON 08/28/2014] cloNIDine  0.3 mg Transdermal Weekly  . dipyridamole-aspirin  1 capsule Oral BID  . enoxaparin (LOVENOX) injection  40 mg Subcutaneous Q24H  . feeding supplement (ENSURE)  1 Container Oral BID BM  . hydrALAZINE  100 mg Oral TID  . insulin aspart  0-9 Units Subcutaneous TID WC  . insulin glargine  13 Units Subcutaneous QHS  . ipratropium-albuterol  3 mL Nebulization Q4H  . isosorbide dinitrate  10 mg Oral TID  . levothyroxine  50 mcg Oral QAC breakfast  . multivitamin with minerals  1 tablet Oral Daily  . sodium chloride  3 mL Intravenous Q12H  . [START ON 08/28/2014] vancomycin  1,000 mg Intravenous Q24H   Continuous Infusions:   Time spent on care of this patient: 35 minute   [place], MD 08/27/2014, 1:59 PM  LOS: 1 day   [facility] Office   [phone] Pager - Text Page per [email or web address]  If 7PM-7AM, please contact night-coverage [email or web address]

## 2014-08-27 NOTE — Progress Notes (Signed)
Unable to give patient PO meds during day shift. Patient has been asleep the majority of the day and very difficult to arouse. MD made aware.

## 2014-08-27 NOTE — ED Provider Notes (Signed)
CSN: [id number]     Arrival date & time 08/26/14  2341 History   First MD Initiated Contact with Patient 08/26/14 2354     Chief Complaint  Patient presents with  . Altered Mental Status     (Consider location/radiation/quality/duration/timing/severity/associated sxs/prior Treatment) HPI 73 year old female presents to emergency department from her nursing home via EMS with shortness of breath, not feeling well, and questionable altered mental status.  Pt recently admitted with CVA with aphasia on 10/1.  Per notes, she had recovered some speech after the stroke.  EMS was called out for respiratory distress.  Pt had been receiving IVF for dehydration, iv infiltrated.  Pt reported to EMS staff she did not feel well.  Pt will not verbalize to myself or nurse other than saying ok ok ok.  She has history of hypercarbic respiratory failure admission on 9/18.  Pt appears to be having difficulty breathing with audible wheezing, tachypnea. Past Medical History  Diagnosis Date  . Hypertension   . Diabetes mellitus   . Chronic renal insufficiency   . Hyperlipidemia   . Osteopenia   . CVA (cerebral infarction)   . Thyroid disease    Past Surgical History  Procedure Laterality Date  . Eye surgery    . Loop recorder implant  03-24-2014    MDT LinQ implanted by Dr Johney FrameAllred for cryptogenic stroke  . Tee without cardioversion N/A 03/23/2014    Procedure: TRANSESOPHAGEAL ECHOCARDIOGRAM (TEE);  Surgeon: Lars MassonKatarina H Nelson, MD;  [facility];  Service: Cardiovascular;  Laterality: N/A;   Family History  Problem Relation Age of Onset  . Diabetes Mother   . Hypertension Mother    History  Substance Use Topics  . Smoking status: Former Smoker -- 0.50 packs/day for 60 years    Types: Cigarettes    Quit date: 06/09/2014  . Smokeless tobacco: Never Used     Comment: pt states "sometimes I quit smoking for about 2 weeks then I start back"  . Alcohol Use: Yes     Comment: occasionally   OB  History   Grav Para Term Preterm Abortions TAB SAB Ect Mult Living                 Review of Systems  Unable to perform ROS: Patient nonverbal      Allergies  Erythromycin; Iodine; Penicillins; and Latex  Home Medications   Prior to Admission medications   Medication Sig Start Date End Date Taking? Authorizing Provider  acetaminophen (TYLENOL) 325 MG tablet Take 650 mg by mouth every 6 (six) hours as needed for mild pain.   Yes Historical Provider, MD  albuterol (PROVENTIL) (2.5 MG/3ML) 0.083% nebulizer solution Take 2.5 mg by nebulization every 6 (six) hours as needed for wheezing or shortness of breath.   Yes Historical Provider, MD  atorvastatin (LIPITOR) 40 MG tablet Take 40 mg by mouth daily.   Yes Historical Provider, MD  budesonide-formoterol (SYMBICORT) 80-4.5 MCG/ACT inhaler Inhale 2 puffs into the lungs 2 (two) times daily.   Yes Historical Provider, MD  carvedilol (COREG) 25 MG tablet Take 25 mg by mouth 2 (two) times daily with a meal.   Yes Historical Provider, MD  cloNIDine (CATAPRES - DOSED IN MG/24 HR) 0.3 mg/24hr patch Place 0.3 mg onto the skin once a week. On friday   Yes Historical Provider, MD  dipyridamole-aspirin (AGGRENOX) 200-25 MG per 12 hr capsule Take 1 capsule by mouth 2 (two) times daily. 08/20/14  Yes Jeralyn BennettEzequiel Zamora, MD  hydrALAZINE (  APRESOLINE) 100 MG tablet Take 100 mg by mouth 3 (three) times daily.   Yes Historical Provider, MD  insulin aspart (NOVOLOG) 100 UNIT/ML injection Inject 0-15 Units into the skin 4 (four) times daily -  before meals and at bedtime. 70-200= 0 units; 201-250= 2 units; 251-300= 4 units; 301-350= 6 units; 351-400= 8 units; >400 give 10 units and recheck CBG in 2 hours. If CBG continues >400= call MD   Yes Historical Provider, MD  insulin glargine (LANTUS) 100 UNIT/ML injection Inject 13 Units into the skin at bedtime.   Yes Historical Provider, MD  isosorbide dinitrate (ISORDIL) 10 MG tablet Take 10 mg by mouth 3 (three) times  daily.   Yes Historical Provider, MD  levothyroxine (SYNTHROID, LEVOTHROID) 50 MCG tablet Take 50 mcg by mouth daily before breakfast.   Yes Historical Provider, MD  senna-docusate (SENOKOT-S) 8.6-50 MG per tablet Take 1 tablet by mouth at bedtime as needed for mild constipation. 08/20/14  Yes Jeralyn BennettEzequiel Zamora, MD   BP 135/57  Pulse 71  Temp(Src) 98.6 F (37 C) (Oral)  Resp 28  SpO2 96% Physical Exam  Constitutional: She is oriented to person, place, and time. She appears well-developed and well-nourished.  Chronically ill appearing obese female in moderate respiratory distress, appears uncomfortable  HENT:  Head: Normocephalic and atraumatic.  Nose: Nose normal.  Mouth/Throat: Oropharynx is clear and moist.  Eyes: Conjunctivae and EOM are normal. Pupils are equal, round, and reactive to light.  Neck: Normal range of motion. Neck supple. No JVD present. No tracheal deviation present. No thyromegaly present.  Cardiovascular: Normal rate, regular rhythm, normal heart sounds and intact distal pulses.  Exam reveals no gallop and no friction rub.   No murmur heard. Pulmonary/Chest: Effort normal. No stridor. No respiratory distress. She has wheezes. She has rales. She exhibits no tenderness.  Increased respiratory effort  Abdominal: Soft. Bowel sounds are normal. She exhibits no distension and no mass. There is no tenderness. There is no rebound and no guarding.  Musculoskeletal: Normal range of motion. She exhibits edema (2+). She exhibits no tenderness.  Lymphadenopathy:    She has no cervical adenopathy.  Neurological: She is alert and oriented to person, place, and time. She displays normal reflexes. She exhibits normal muscle tone. Coordination normal.  Skin: Skin is warm and dry. No rash noted. No erythema. No pallor.  Psychiatric: She has a normal mood and affect. Her behavior is normal. Judgment and thought content normal.    ED Course  Procedures (including critical care  time) Labs Review Labs Reviewed  CBC - Abnormal; Notable for the following:    RBC 2.85 (*)    Hemoglobin 8.5 (*)    HCT 26.7 (*)    All other components within normal limits  COMPREHENSIVE METABOLIC PANEL - Abnormal; Notable for the following:    Glucose, Bld 209 (*)    Creatinine, Ser 1.51 (*)    Total Protein 5.8 (*)    Albumin 2.4 (*)    Total Bilirubin <0.2 (*)    GFR calc non Af Amer 33 (*)    GFR calc Af Amer 38 (*)    All other components within normal limits  CBG MONITORING, ED - Abnormal; Notable for the following:    Glucose-Capillary 198 (*)    All other components within normal limits  I-STAT CG4 LACTIC ACID, ED - Abnormal; Notable for the following:    Lactic Acid, Venous 0.35 (*)    All other components within normal limits  I-STAT ARTERIAL BLOOD GAS, ED - Abnormal; Notable for the following:    pCO2 arterial 46.0 (*)    pO2, Arterial 77.0 (*)    Bicarbonate 27.1 (*)    All other components within normal limits  CULTURE, BLOOD (ROUTINE X 2)  CULTURE, BLOOD (ROUTINE X 2)  URINALYSIS, ROUTINE W REFLEX MICROSCOPIC  I-STAT TROPOININ, ED    Imaging Review Dg Chest Port 1 View  08/27/2014   CLINICAL DATA:  73 year old female with acute shortness of Breath. Initial encounter. Current history of recent infarct.  EXAM: PORTABLE CHEST - 1 VIEW  COMPARISON:  08/06/2014 and earlier.  FINDINGS: Portable AP semi upright view at 0014 hr. New retrocardiac opacity obscuring the left hemidiaphragm. Stable cardiac size and mediastinal contours. Cardiac event recorder. Mild veiling opacity also at the right lung base. No pneumothorax or pulmonary edema.  IMPRESSION: New left greater than right lower lobe opacity, that on the left compatible with pneumonia or aspiration in this setting.   Electronically Signed   By: Augusto Gamble M.D.   On: 08/27/2014 00:24     EKG Interpretation   Date/Time:  Wednesday August 26 2014 23:50:48 EDT Ventricular Rate:  74 PR Interval:  139 QRS  Duration: 84 QT Interval:  413 QTC Calculation: 458 R Axis:   76 Text Interpretation:  Sinus rhythm Low voltage, precordial leads  Borderline T wave abnormalities Confirmed by Sinda Leedom  MD, Zailyn Rowser ([id number]) on  08/27/2014 12:29:42 AM      MDM   Final diagnoses:  Dyspnea  HCAP (healthcare-associated pneumonia)  Renal insufficiency  Dehydration    73 yo female with ill appearance, possible HCAP, COPD exacerbation.  Plan for labs, port chest, breathing treatment and close monitoring.    2:12 AM CXR concerning for pna, no fever or hypotension, but with some moderate respiratory distress.  Pt with increase in Cr from baseline as well.  Will give gentle iv hydration.  Pt is speaking a little more now, reports she is feeling better after breathing treatment but still feels unwell.  Tachypnea still present.  Will d/w hospitalist for admission, will start abx for HCAP.  Olivia Mackie, MD 08/27/14 [phone]

## 2014-08-27 NOTE — Progress Notes (Signed)
Per day shift RN patient has not voided all day. Last void charted 5:45 AM. Pt bladder scan greater than 300 cc. Pt disoriented x4 and unable to tell RN if needs to void. K kirby notified. And new order for I/O cath

## 2014-08-27 NOTE — Progress Notes (Signed)
Patient is sleeping and difficult to arouse. Unable to give morning medications at this time. Patient was alert earlier in the morning to eat breakfast. MD made aware via text page.

## 2014-08-27 NOTE — Progress Notes (Signed)
ANTIBIOTIC CONSULT NOTE - INITIAL  Pharmacy Consult for vancomycin and cefepime Indication: pneumonia  Allergies  Allergen Reactions  . Erythromycin Shortness Of Breath  . Iodine Hives  . Penicillins Hives  . Latex Hives and Itching    Patient Measurements:   Adjusted Body Weight: 75 kg actual  Vital Signs: Temp: 98.6 F (37 C) (10/22 0410) Temp Source: Axillary (10/22 0410) BP: 142/51 mmHg (10/22 0410) Pulse Rate: 78 (10/22 0410) Intake/Output from previous day: 10/21 0701 - 10/22 0700 In: 50 [IV Piggyback:50] Out: -  Intake/Output from this shift: Total I/O In: 50 [IV Piggyback:50] Out: -   Labs:  Recent Labs  08/27/14 0011  WBC 4.3  HGB 8.5*  PLT 214  CREATININE 1.51*   The CrCl is unknown because both a height and weight (above a minimum accepted value) are required for this calculation. No results found for this basename: VANCOTROUGH, VANCOPEAK, VANCORANDOM, GENTTROUGH, GENTPEAK, GENTRANDOM, TOBRATROUGH, TOBRAPEAK, TOBRARND, AMIKACINPEAK, AMIKACINTROU, AMIKACIN,  in the last 72 hours   Microbiology: No results found for this or any previous visit (from the past 720 hour(s)).  Medical History: Past Medical History  Diagnosis Date  . Hypertension   . Diabetes mellitus   . Chronic renal insufficiency   . Hyperlipidemia   . Osteopenia   . CVA (cerebral infarction)   . Thyroid disease     Medications:  Prescriptions prior to admission  Medication Sig Dispense Refill  . acetaminophen (TYLENOL) 325 MG tablet Take 650 mg by mouth every 6 (six) hours as needed for mild pain.      Marland Kitchen. albuterol (PROVENTIL) (2.5 MG/3ML) 0.083% nebulizer solution Take 2.5 mg by nebulization every 6 (six) hours as needed for wheezing or shortness of breath.      Marland Kitchen. atorvastatin (LIPITOR) 40 MG tablet Take 40 mg by mouth daily.      . budesonide-formoterol (SYMBICORT) 80-4.5 MCG/ACT inhaler Inhale 2 puffs into the lungs 2 (two) times daily.      . carvedilol (COREG) 25 MG tablet  Take 25 mg by mouth 2 (two) times daily with a meal.      . cloNIDine (CATAPRES - DOSED IN MG/24 HR) 0.3 mg/24hr patch Place 0.3 mg onto the skin once a week. On friday      . dipyridamole-aspirin (AGGRENOX) 200-25 MG per 12 hr capsule Take 1 capsule by mouth 2 (two) times daily.  60 capsule  1  . hydrALAZINE (APRESOLINE) 100 MG tablet Take 100 mg by mouth 3 (three) times daily.      . insulin aspart (NOVOLOG) 100 UNIT/ML injection Inject 0-15 Units into the skin 4 (four) times daily -  before meals and at bedtime. 70-200= 0 units; 201-250= 2 units; 251-300= 4 units; 301-350= 6 units; 351-400= 8 units; >400 give 10 units and recheck CBG in 2 hours. If CBG continues >400= call MD      . insulin glargine (LANTUS) 100 UNIT/ML injection Inject 13 Units into the skin at bedtime.      . isosorbide dinitrate (ISORDIL) 10 MG tablet Take 10 mg by mouth 3 (three) times daily.      Marland Kitchen. levothyroxine (SYNTHROID, LEVOTHROID) 50 MCG tablet Take 50 mcg by mouth daily before breakfast.      . senna-docusate (SENOKOT-S) 8.6-50 MG per tablet Take 1 tablet by mouth at bedtime as needed for mild constipation.  30 tablet  1   Assessment: 73 yo NH patient with SOB and AMS. CVA 10/1. Hx of hypercarbic RF. vanc 1 gm  received in ED est Crcl =38 ml/min   Goal of Therapy:  Vancomycin trough level 15-20 mcg/ml  Plan:  Continue 1gm vanc q24h  Cefepime 1gm q24h   Janice CoffinEarl, Lamount Bankson Jonathan 08/27/2014,4:56 AM

## 2014-08-27 NOTE — Evaluation (Signed)
Clinical/Bedside Swallow Evaluation Patient Details  Name: [name] [name] MRN: [id number] Date of Birth: 01/30/1941  Today's Date: 08/27/2014 Time: [id number] SLP Time Calculation (min): 12 min  Past Medical History:  Past Medical History  Diagnosis Date  . Hypertension   . Diabetes mellitus   . Chronic renal insufficiency   . Hyperlipidemia   . Osteopenia   . CVA (cerebral infarction)   . Thyroid disease   . Shortness of breath    Past Surgical History:  Past Surgical History  Procedure Laterality Date  . Eye surgery    . Loop recorder implant  03-24-2014    MDT LinQ implanted by Dr [name] [name] for cryptogenic stroke  . Tee without cardioversion N/A 03/23/2014    Procedure: TRANSESOPHAGEAL ECHOCARDIOGRAM (TEE);  Surgeon: [name] MassonKatarina H Nelson, MD;  [facility];  Service: Cardiovascular;  Laterality: N/A;   HPI:  73 y.o. female who was recently admitted for stroke with aphasia, diabetes mellitus, chronic kidney disease, hypothyroidism, hypertension, dysphagia admitted mild confusion and SOB.  Chest x-ray shows features concerning for pneumonia (CXR New left greater than right lower lobe opacity, that on the left compatible with pneumonia or aspiration in this setting). MBS 08/01/14 revealed laryngeal penetration with thin and Dys 1, nectar recommended. During her recent admission for stroke, PEG tube placement was planned but eventually patient mental status improved and patient was placed on dysphagia one diet, nectar.  Alertness fluctuated during previous admission.   Assessment / Plan / Recommendation Clinical Impression  Pt. opens eyes when requested and required constant tactile and verbal cues.  MInimal attempts at bolus manipulation; held puree bolus despite cueing per SLP who utilized toothette to remove po's.  RN reported, per tech, pt. awake during breakfast and able to consume meal with several coughs but no overt difficulty.  [name] [name] family desired PEG last admission but  GI was unable to place.  Recommend continue Dys 1 diet and downgrade liquids to nectar consistency.  She is at high aspiration risk and will likely have repeated pna's.  Pt. was followed by Palliative care team previous visit.  Follow swallow precautions: eat only when alert, check oral cavity for residue, sit upright, allow adequate time for swallow trigger.  Will follow briefly for continued education.      Aspiration Risk  Severe    Diet Recommendation Dysphagia 1 (Puree);Nectar-thick liquid   Liquid Administration via: Cup;No straw Medication Administration: Via alternative means Supervision: Staff to assist with self feeding;Full supervision/cueing for compensatory strategies Compensations: Slow rate;Small sips/bites;Check for pocketing Postural Changes and/or Swallow Maneuvers: Seated upright 90 degrees;Upright 30-60 min after meal    Other  Recommendations Oral Care Recommendations: Oral care BID Other Recommendations: Order thickener from pharmacy   Follow Up Recommendations  Skilled Nursing facility    Frequency and Duration min 1 x/week  2 weeks   Pertinent Vitals/Pain No pain         Swallow Study          Oral/Motor/Sensory Function Overall Oral Motor/Sensory Function: Impaired at baseline   Ice Chips Ice chips: Not tested   Thin Liquid Thin Liquid: Not tested    Nectar Thick Nectar Thick Liquid: Not tested   Honey Thick Honey Thick Liquid: Not tested   Puree Puree: Impaired   Solid   GO    Solid: Not tested       [name] [name], [name] [name] 08/27/2014,2:23 PM  [name] CoonsLisa [name] [name] FaceLitaker M.Ed [employer] Pager [phone]

## 2014-08-27 NOTE — Progress Notes (Signed)
UR complete.  Majd Tissue RN, MSN 

## 2014-08-27 NOTE — Progress Notes (Signed)
INITIAL NUTRITION ASSESSMENT  DOCUMENTATION CODES Per approved criteria  -Not Applicable   INTERVENTION: Provide Ensure Pudding BID Provide Magic Cup BID RD to continue to monitor PO intake  NUTRITION DIAGNOSIS: Predicted sub optimal intake related to dysphagia and mild confusion as evidenced by pt's chart and 45% meal completion.   Goal: Pt to meet >/= 90% of their estimated nutrition needs   Monitor:  PO intake, weight trend, labs, I/O's  Reason for Assessment: Malnutrition Screening Tool  73 y.o. female  Admitting Dx: HCAP (healthcare-associated pneumonia)  ASSESSMENT: 73 y.o. female who was recently admitted for stroke with aphasia, diabetes mellitus, chronic kidney disease, hypothyroidism, hypertension was brought to the ER from the nursing home after patient was found to be having mild confusion and short of breath.   Pt asleep at time of visit. No obvious signs of muscle or fat wasting. Per weight history below pt's weight has been fairly stable for the past month. Per nursing notes pt ate 45% of breakfast. During previous admission pt was on Dysphagia 1 diet with nectar-thick liquids and was eating Ensure Pudding and Magic Cup ice cream to supplement meals.   Height: Ht Readings from Last 1 Encounters:  08/27/14 5\' 5"  (1.651 m)    Weight: Wt Readings from Last 1 Encounters:  08/27/14 167 lb 1.7 oz (75.8 kg)    Ideal Body Weight: 125 lbs  % Ideal Body Weight: 134%  Wt Readings from Last 10 Encounters:  08/27/14 167 lb 1.7 oz (75.8 kg)  08/26/14 165 lb (74.844 kg)  08/24/14 165 lb (74.844 kg)  08/08/14 167 lb 8.8 oz (76 kg)  07/30/14 171 lb 1.2 oz (77.6 kg)  07/20/14 188 lb (85.276 kg)  07/16/14 188 lb (85.276 kg)  07/06/14 173 lb (78.472 kg)  06/14/14 182 lb 15.7 oz (83 kg)  05/21/14 161 lb (73.029 kg)    Usual Body Weight: unknown  % Usual Body Weight: NA  BMI:  Body mass index is 27.81 kg/(m^2).  Estimated Nutritional Needs: Kcal:  1700-1900 Protein: 90-100 grams Fluid: 1.7-1.9 L/day  Skin: stage 2 pressure ulcer on sacrum, stage 1 pressure ulcer on coccyx; non-pitting RUE, LUE, RLE, and LLE edema  Diet Order: Dysphagia 1, thin liquids  EDUCATION NEEDS: -No education needs identified at this time   Intake/Output Summary (Last 24 hours) at 08/27/14 1031 Last data filed at 08/27/14 0938  Gross per 24 hour  Intake    170 ml  Output      0 ml  Net    170 ml    Last BM: PTA   Labs:   Recent Labs Lab 08/27/14 0011 08/27/14 0609  NA 143 145  K 4.2 4.6  CL 109 110  CO2 25 26  BUN 15 15  CREATININE 1.51* 1.48*  CALCIUM 8.5 8.5  GLUCOSE 209* 227*    CBG (last 3)   Recent Labs  08/27/14 0002 08/27/14 0605  GLUCAP 198* 200*    Scheduled Meds: . atorvastatin  40 mg Oral Daily  . budesonide-formoterol  2 puff Inhalation BID  . carvedilol  25 mg Oral BID WC  . ceFEPime (MAXIPIME) IV  1 g Intravenous Q24H  . [START ON 08/28/2014] cloNIDine  0.3 mg Transdermal Weekly  . dipyridamole-aspirin  1 capsule Oral BID  . enoxaparin (LOVENOX) injection  40 mg Subcutaneous Q24H  . hydrALAZINE  100 mg Oral TID  . insulin aspart  0-9 Units Subcutaneous TID WC  . insulin glargine  13 Units Subcutaneous QHS  .  ipratropium-albuterol  3 mL Nebulization Q4H  . isosorbide dinitrate  10 mg Oral TID  . levothyroxine  50 mcg Oral QAC breakfast  . sodium chloride  3 mL Intravenous Q12H  . [START ON 08/28/2014] vancomycin  1,000 mg Intravenous Q24H    Continuous Infusions:   Past Medical History  Diagnosis Date  . Hypertension   . Diabetes mellitus   . Chronic renal insufficiency   . Hyperlipidemia   . Osteopenia   . CVA (cerebral infarction)   . Thyroid disease   . Shortness of breath     Past Surgical History  Procedure Laterality Date  . Eye surgery    . Loop recorder implant  03-24-2014    MDT LinQ implanted by Dr Johney FrameAllred for cryptogenic stroke  . Tee without cardioversion N/A 03/23/2014     Procedure: TRANSESOPHAGEAL ECHOCARDIOGRAM (TEE);  Surgeon: Lars MassonKatarina H Nelson, MD;  [facility];  Service: Cardiovascular;  Laterality: N/A;    Ian Malkineanne Barnett RD, LDN Inpatient Clinical Dietitian Pager: [phone] After Hours Pager: [phone]

## 2014-08-28 LAB — CBC
HEMATOCRIT: 25.4 % — AB (ref 36.0–46.0)
HEMOGLOBIN: 7.9 g/dL — AB (ref 12.0–15.0)
MCH: 28.5 pg (ref 26.0–34.0)
MCHC: 31.1 g/dL (ref 30.0–36.0)
MCV: 91.7 fL (ref 78.0–100.0)
Platelets: 219 10*3/uL (ref 150–400)
RBC: 2.77 MIL/uL — ABNORMAL LOW (ref 3.87–5.11)
RDW: 14.3 % (ref 11.5–15.5)
WBC: 3.7 10*3/uL — AB (ref 4.0–10.5)

## 2014-08-28 LAB — GLUCOSE, CAPILLARY
GLUCOSE-CAPILLARY: 43 mg/dL — AB (ref 70–99)
GLUCOSE-CAPILLARY: 91 mg/dL (ref 70–99)
GLUCOSE-CAPILLARY: 206 mg/dL — AB (ref 70–99)
Glucose-Capillary: 87 mg/dL (ref 70–99)
Glucose-Capillary: 40 mg/dL — CL (ref 70–99)
Glucose-Capillary: 128 mg/dL — ABNORMAL HIGH (ref 70–99)

## 2014-08-28 LAB — BASIC METABOLIC PANEL
Anion gap: 8 (ref 5–15)
BUN: 14 mg/dL (ref 6–23)
CHLORIDE: 112 meq/L (ref 96–112)
CO2: 28 mEq/L (ref 19–32)
Calcium: 8.4 mg/dL (ref 8.4–10.5)
Creatinine, Ser: 1.37 mg/dL — ABNORMAL HIGH (ref 0.50–1.10)
GFR calc Af Amer: 43 mL/min — ABNORMAL LOW (ref >90)
GFR calc non Af Amer: 37 mL/min — ABNORMAL LOW (ref >90)
GLUCOSE: 39 mg/dL — AB (ref 70–99)
Potassium: 4.1 mEq/L (ref 3.7–5.3)
Sodium: 148 mEq/L — ABNORMAL HIGH (ref 137–147)

## 2014-08-28 MED ORDER — DEXTROSE 50 % IV SOLN
INTRAVENOUS | Status: AC
  Administered 2014-08-28: 25 mL
  Filled 2014-08-28: qty 50

## 2014-08-28 MED ORDER — CARVEDILOL 25 MG PO TABS
25.0000 mg | ORAL_TABLET | Freq: Two times a day (BID) | ORAL | Status: DC
  Administered 2014-08-28 – 2014-08-29 (×3): 25 mg via ORAL
  Filled 2014-08-28 (×4): qty 1

## 2014-08-28 MED ORDER — LEVOTHYROXINE SODIUM 50 MCG PO TABS
50.0000 ug | ORAL_TABLET | Freq: Every day | ORAL | Status: DC
  Administered 2014-08-29: 50 ug via ORAL
  Filled 2014-08-28 (×2): qty 1

## 2014-08-28 MED ORDER — IPRATROPIUM-ALBUTEROL 0.5-2.5 (3) MG/3ML IN SOLN: 3.0000 mL | RESPIRATORY_TRACT | Status: DC | PRN

## 2014-08-28 MED ORDER — HYDRALAZINE HCL 50 MG PO TABS
100.0000 mg | ORAL_TABLET | Freq: Three times a day (TID) | ORAL | Status: DC
  Administered 2014-08-28 – 2014-08-29 (×4): 100 mg via ORAL
  Filled 2014-08-28 (×5): qty 2

## 2014-08-28 MED ORDER — INSULIN GLARGINE 100 UNIT/ML ~~LOC~~ SOLN
10.0000 [IU] | Freq: Every day | SUBCUTANEOUS | Status: DC
  Administered 2014-08-28: 10 [IU] via SUBCUTANEOUS
  Filled 2014-08-28 (×2): qty 0.1

## 2014-08-28 NOTE — Clinical Social Work Psychosocial (Signed)
     Clinical Social Work Department BRIEF PSYCHOSOCIAL ASSESSMENT 08/28/2014  Patient:  [name] [name],[name] [name]     Account Number:  [id number]     Admit date:  08/26/2014  Clinical Social Worker:  Manya SilvasROWDER,Tierra Divelbiss, LCSW  Date/Time:  08/28/2014 01:54 PM  Referred by:  Physician  Date Referred:  08/28/2014 Referred for  Other - See comment   Other Referral:   Return to SNF   Interview type:   Other interview type:    PSYCHOSOCIAL DATA Living Status:  FACILITY [facility] Level of care:  Skilled Nursing Facility Primary support name:  [name] [name]   [phone] Primary support relationship to patient:  CHILD, ADULT Degree of support available:   HCPOA- Son   Strong support  Patiient's Husband- [name] [name] supportive-  Son [name] [name] will let him know about patient's condition/dc etc    CURRENT CONCERNS Current Concerns  Other - See comment   Other Concerns:   Return to SNF    SOCIAL WORK ASSESSMENT / PLAN 73 year old female- [facility] [address] [facility]. [facility]  Per discussion with Dr. Butler Denmarkizwan- anticipate possible d/c over the weekend back to SNF. Ok per [employer]- [facility]. Discussed with patient's son [name] [name] who is HCPOA. He is agreeable to same.Fl2 completed and place on chart for MD's signature.   Assessment/plan status:  Psychosocial Support/Ongoing Assessment of Needs Other assessment/ plan:   Information/referral to community resources:   None at this time    PATIENTS/FAMILYS RESPONSE TO PLAN OF CARE: Patient is alert but confused; unable to respond or partiicpate in plan of care.  Patient's son [name] [name] is [facility]. Facility will accept back.  CSW will monitor and assist with d/c.

## 2014-08-28 NOTE — Progress Notes (Signed)
Speech Language Pathology Treatment: Dysphagia  Patient Details Name: [name] [name] MRN: [id number] DOB: Mar 25, 1941 Today's Date: 08/28/2014 Time: 1540-1550 SLP Time Calculation (min): 10 min  Assessment / Plan / Recommendation Clinical Impression  Pt more alert today, responsive, and communicative.  Consumed dys 1 materials, nectar-thick liquids with prolonged mastication, likely delayed swallow, but no overt s/s of aspiration.  Pt appears to be back to baseline with overall swallow function.  Recommend continuing this diet at D/C.  No further acute SLP needs identified - will sign off.    HPI HPI: 73 y.o. female who was recently admitted for stroke with aphasia, diabetes mellitus, chronic kidney disease, hypothyroidism, hypertension, dysphagia admitted mild confusion and SOB.  Chest x-ray shows features concerning for pneumonia (CXR New left greater than right lower lobe opacity, that on the left compatible with pneumonia or aspiration in this setting). MBS 08/01/14 revealed laryngeal penetration with thin and Dys 1, nectar recommended. During her recent admission for stroke, PEG tube placement was planned but eventually patient mental status improved and patient was placed on dysphagia one diet, nectar.  Alertness fluctuated during previous admission.   Pertinent Vitals Pain Assessment: No/denies pain  SLP Plan  All goals met    Recommendations Diet recommendations: Dysphagia 1 (puree);Nectar-thick liquid Liquids provided via: Cup Medication Administration: Crushed with puree Supervision: Trained caregiver to feed patient Compensations: Slow rate;Small sips/bites;Check for pocketing Postural Changes and/or Swallow Maneuvers: Seated upright 90 degrees;Upright 30-60 min after meal              Oral Care Recommendations: Oral care BID Follow up Recommendations: Skilled Nursing facility Plan: All goals met   Tytionna Cloyd L. Tivis Ringer, [place] CCC/SLP Pager [phone]      Juan Quam  Laurice 08/28/2014, 3:56 PM

## 2014-08-28 NOTE — Progress Notes (Signed)
Chaplain responded to spiritual care consult. Pt seemed confused as why her pastor was not present. Pt agreed that it was okay for me to pray with her. Page chaplain if needed.   08/28/14 1500  Clinical Encounter Type  Visited With Patient  Visit Type Spiritual support  Referral From Nurse  Spiritual Encounters  Spiritual Needs Prayer  Jiles HaroldStamey, Devean Skoczylas F, Chaplain 08/28/2014 3:45 PM

## 2014-08-28 NOTE — Progress Notes (Signed)
[facility] Progress Note   [name] [name] J Oberry JXB:[id number] DOB: 09-Jan-1941 DOA: 08/26/2014 PCP: Alva GarnetSHELTON,[name] R., MD  Brief narrative: [name] [name] is a 73 y.o. female with past medical history of recent CVA with a hazy, diabetes mellitus, CAD, hypothyroidism, hypertension he was brought to the hospital for confusion and shortness of breath. She is admitted for probable pneumonia with acute delirium.  Subjective: Patient is more alert today - able to eat breakfast- confused to day, month and year and does not know her age but aware she is in the hospital - she believes she is here for a stroke.   Assessment/Plan: Principal Problem:   HCAP bilateral lower lower passageways noted on x-ray on admission -continue vancomycin and cefepime  -she pulled out her IV-  PICC line  placed 10/22 -Requiring 2 L of oxygen to keep her pulse ox above 90%  Active Problems: Lethargy/delirium -Possibly do to underlying acute infection- improved today- will switch IV meds back to oral- will d/c IVF    Hypertension -cont home meds    DM type 2, uncontrolled, with renal complications -had some hypoglycemia early today prior to eating breakfast - decrease Lantus and cont current NovoLog scale  Anemia - follow - Hgb at 7.9 today- likely dilutional as I have had her on IVF   Hypothyroidism -Continue Synthroid  CKD 3 - stable   Code Status: DO NOT RESUSCITATE Family Communication: None Disposition Plan: Likely return to her nursing facility when stable DVT prophylaxis: Lovenox  Consultants: None  Procedures: None  Antibiotics: Anti-infectives   Start     Dose/Rate Route Frequency Ordered Stop   08/28/14 0400  vancomycin (VANCOCIN) IVPB 1000 mg/200 mL premix     1,000 mg 200 mL/hr over 60 Minutes Intravenous Every 24 hours 08/27/14 0504     08/27/14 0600  ceFEPIme (MAXIPIME) 1 g in dextrose 5 % 50 mL IVPB     1 g 100 mL/hr over 30 Minutes Intravenous Every 24 hours 08/27/14 0503      08/27/14 0245  vancomycin (VANCOCIN) IVPB 1000 mg/200 mL premix     1,000 mg 200 mL/hr over 60 Minutes Intravenous  Once 08/27/14 0241 08/27/14 0430   08/27/14 0215  aztreonam (AZACTAM) 2 g in dextrose 5 % 50 mL IVPB     2 g 100 mL/hr over 30 Minutes Intravenous  Once 08/27/14 0212 08/27/14 0324         Objective: Filed Weights   08/27/14 0410 08/28/14 0450 08/28/14 0607  Weight: 75.8 kg (167 lb 1.7 oz) 66 kg (145 lb 8.1 oz) 66 kg (145 lb 8.1 oz)    Intake/Output Summary (Last 24 hours) at 08/28/14 1336 Last data filed at 08/28/14 0908  Gross per 24 hour  Intake 1682.5 ml  Output    400 ml  Net 1282.5 ml     Vitals Filed Vitals:   08/28/14 0450 08/28/14 0607 08/28/14 0818 08/28/14 1149  BP: 144/57   128/64  Pulse: 66     Temp: 98.4 F (36.9 C)     TempSrc: Oral     Resp: 20     Height:      Weight: 66 kg (145 lb 8.1 oz) 66 kg (145 lb 8.1 oz)    SpO2: 100%  100%     Exam: General: No acute respiratory distress-alert, confused Lungs: Clear to auscultation bilaterally without wheezes or crackles Cardiovascular: Regular rate and rhythm without murmur gallop or rub normal S1 and S2 Abdomen: Nontender, nondistended, soft,  bowel sounds positive, no rebound, no ascites, no appreciable mass Extremities: No significant cyanosis, clubbing, or edema bilateral lower extremities  Data Reviewed: Basic Metabolic Panel:  Recent Labs Lab 08/27/14 0011 08/27/14 0609 08/28/14 0830  NA 143 145 148*  K 4.2 4.6 4.1  CL 109 110 112  CO2 25 26 28   GLUCOSE 209* 227* 39*  BUN 15 15 14   CREATININE 1.51* 1.48* 1.37*  CALCIUM 8.5 8.5 8.4   Liver Function Tests:  Recent Labs Lab 08/27/14 0011 08/27/14 0609  AST 14 12  ALT 10 9  ALKPHOS 71 63  BILITOT <0.2* <0.2*  PROT 5.8* 5.6*  ALBUMIN 2.4* 2.3*   No results found for this basename: LIPASE, AMYLASE,  in the last 168 hours No results found for this basename: AMMONIA,  in the last 168 hours CBC:  Recent Labs Lab  08/27/14 0011 08/27/14 0609 08/28/14 0830  WBC 4.3 4.2 3.7*  NEUTROABS  --  2.7  --   HGB 8.5* 8.2* 7.9*  HCT 26.7* 25.4* 25.4*  MCV 93.7 91.7 91.7  PLT 214 208 219   Cardiac Enzymes: No results found for this basename: CKTOTAL, CKMB, CKMBINDEX, TROPONINI,  in the last 168 hours BNP (last 3 results)  Recent Labs  06/09/14 1344 06/12/14 1330 07/24/14 1000  PROBNP 1271.0* 1666.0* 6064.0*   CBG:  Recent Labs Lab 08/27/14 2147 08/28/14 0601 08/28/14 0940 08/28/14 1004 08/28/14 1035  GLUCAP 157* 87 40* 43* 91    Recent Results (from the past 240 hour(s))  CULTURE, BLOOD (ROUTINE X 2)     Status: None   Collection Time    08/27/14 12:45 AM      Result Value Ref Range Status   Specimen Description BLOOD RIGHT ARM   Final   Special Requests     Final   Value: BOTTLES DRAWN AEROBIC AND ANAEROBIC 10CC BLUE 5CC RED   Culture  Setup Time     Final   Value: 08/27/2014 03:44     Performed at [facility]   Culture     Final   Value:        BLOOD CULTURE RECEIVED NO GROWTH TO DATE CULTURE WILL BE HELD FOR 5 DAYS BEFORE ISSUING A FINAL NEGATIVE REPORT     Performed at [facility]   Report Status PENDING   Incomplete  CULTURE, BLOOD (ROUTINE X 2)     Status: None   Collection Time    08/27/14 12:55 AM      Result Value Ref Range Status   Specimen Description BLOOD LEFT ARM   Final   Special Requests BOTTLES DRAWN AEROBIC ONLY 10CC   Final   Culture  Setup Time     Final   Value: 08/27/2014 03:44     Performed at [facility]   Culture     Final   Value:        BLOOD CULTURE RECEIVED NO GROWTH TO DATE CULTURE WILL BE HELD FOR 5 DAYS BEFORE ISSUING A FINAL NEGATIVE REPORT     Performed at [facility]   Report Status PENDING   Incomplete  MRSA PCR SCREENING     Status: None   Collection Time    08/27/14  4:03 AM      Result Value Ref Range Status   MRSA by PCR NEGATIVE  NEGATIVE Final   Comment:            The GeneXpert MRSA Assay (FDA  approved for NASAL specimens     only), is one component of a     comprehensive MRSA colonization     surveillance program. It is not     intended to diagnose MRSA     infection nor to guide or     monitor treatment for     MRSA infections.     Studies:  Recent x-ray studies have been reviewed in detail by the Attending Physician  Scheduled Meds:  Scheduled Meds: . atorvastatin  40 mg Oral Daily  . budesonide-formoterol  2 puff Inhalation BID  . ceFEPime (MAXIPIME) IV  1 g Intravenous Q24H  . cloNIDine  0.3 mg Transdermal Weekly  . dipyridamole-aspirin  1 capsule Oral BID  . enoxaparin (LOVENOX) injection  40 mg Subcutaneous Q24H  . feeding supplement (ENSURE)  1 Container Oral BID BM  . insulin aspart  0-9 Units Subcutaneous TID WC  . insulin glargine  13 Units Subcutaneous QHS  . isosorbide dinitrate  10 mg Oral TID  . levothyroxine  26 mcg Intravenous Q24H  . multivitamin with minerals  1 tablet Oral Daily  . sodium chloride  3 mL Intravenous Q12H  . vancomycin  1,000 mg Intravenous Q24H   Continuous Infusions: . sodium chloride 75 mL/hr at 08/28/14 0607    Time spent on care of this patient: 35 minute   Aleisa Howk, MD 08/28/2014, 1:36 PM  LOS: 2 days   [facility] Office  [phone] Pager - Text Page per [email or web address]  If 7PM-7AM, please contact night-coverage [email or web address]

## 2014-08-28 NOTE — Clinical Documentation Improvement (Signed)
Possible Clinical Conditions?    _______CKD Stage I - GFR > OR = 90 _______CKD Stage II - GFR 60-80 _______CKD Stage III - GFR 30-59 _______CKD Stage IV - GFR 15-29 _______CKD Stage V - GFR < 15 _______ESRD (End Stage Renal Disease) _______Other condition _______Cannot Clinically determine     Risk Factors:  CKD with mild worsening of renal function, closely follow metabolic panel per 10/22 progress notes.    Diagnostics:  Bun: 10/22: 15. 10/15: 14. Creat:  10/22: 1.51. 10/15: 1.14. GFR:  10/22: 38. 10/15: 53    Thank You, Marciano SequinWanda Mathews-Bethea,RN,BSN, Clinical Documentation Specialist:  [phone]  [facility]- Health Information Management

## 2014-08-28 NOTE — Evaluation (Signed)
Physical Therapy Evaluation Patient Details Name: [name] [name] MRN: [id number] DOB: Jan 21, 1941 Today's Date: 08/28/2014   History of Present Illness  Pt adm from SNF with HCAP. Pt with recent admissions for Lt CVA and respiratory failure.   Clinical Impression  Pt admitted with above. Pt currently with functional limitations due to the deficits listed below (see PT Problem List).  Pt will benefit from skilled PT to increase their independence and safety with mobility to allow discharge to the venue listed below.       Follow Up Recommendations SNF    Equipment Recommendations  None recommended by PT    Recommendations for Other Services       Precautions / Restrictions Precautions Precautions: Fall      Mobility  Bed Mobility Overal bed mobility: Needs Assistance Bed Mobility: Supine to Sit;Sit to Supine     Supine to sit: Mod assist Sit to supine: Min assist   General bed mobility comments: Assist to bring trunk up into sitting and assist to bring legs back up into bed.  Transfers Overall transfer level: Needs assistance Equipment used: Ambulation equipment used Antony Salmon(Stedy) Transfers: Sit to/from Stand Sit to Stand: Min assist         General transfer comment: Verbal cues for technique and assist to bring hips up. Stood with stedy x 2 for 1 minute each time  Ambulation/Gait                Stairs            Wheelchair Mobility    Modified Rankin (Stroke Patients Only)       Balance Overall balance assessment: Needs assistance Sitting-balance support: Feet unsupported;Single extremity supported Sitting balance-Leahy Scale: Poor Sitting balance - Comments: Sat x 20 minutes EOB with supervision and single extremity support. Pt able to perform activities with other extremity.   Standing balance support: Bilateral upper extremity supported Standing balance-Leahy Scale: Poor Standing balance comment: Stood with Stedy and min a to maintain static  standing.                             Pertinent Vitals/Pain Pain Assessment: No/denies pain    Home Living Family/patient expects to be discharged to:: Skilled nursing facility                      Prior Function Level of Independence: Needs assistance   Gait / Transfers Assistance Needed: Was transferring with +2 mod A during last admission  ADL's / Homemaking Assistance Needed: (A) with adls bathing/ dressing  Comments: Patient legally blind     Hand Dominance   Dominant Hand: Right    Extremity/Trunk Assessment   Upper Extremity Assessment: Generalized weakness           Lower Extremity Assessment: Generalized weakness         Communication   Communication: Expressive difficulties;HOH  Cognition Arousal/Alertness: Awake/alert Behavior During Therapy: WFL for tasks assessed/performed Overall Cognitive Status: History of cognitive impairments - at baseline   Orientation Level: Disoriented to;Place;Time;Situation Current Attention Level: Sustained Memory: Decreased short-term memory Following Commands: Follows one step commands consistently Safety/Judgement: Decreased awareness of safety;Decreased awareness of deficits   Problem Solving: Slow processing      General Comments      Exercises        Assessment/Plan    PT Assessment Patient needs continued PT services  PT Diagnosis Difficulty walking;Generalized weakness  PT Problem List Decreased strength;Decreased activity tolerance;Decreased balance;Decreased mobility;Decreased cognition;Decreased knowledge of use of DME;Decreased safety awareness;Decreased knowledge of precautions  PT Treatment Interventions DME instruction;Gait training;Functional mobility training;Therapeutic activities;Therapeutic exercise;Balance training;Neuromuscular re-education;Patient/family education   PT Goals (Current goals can be found in the Care Plan section) Acute Rehab PT Goals Patient Stated  Goal: Not stated PT Goal Formulation: With patient Time For Goal Achievement: 09/04/14 Potential to Achieve Goals: Fair    Frequency Min 2X/week   Barriers to discharge        Co-evaluation               End of Session Equipment Utilized During Treatment: Gait belt;Other (comment) Antony Salmon(Stedy) Activity Tolerance: Patient tolerated treatment well Patient left: in bed;with call bell/phone within reach;with bed alarm set Nurse Communication: Mobility status (tech)         Time: [id number] PT Time Calculation (min): 28 min   Charges:   PT Evaluation $Initial PT Evaluation Tier I: 1 Procedure PT Treatments $Gait Training: 8-22 mins $Therapeutic Activity: 8-22 mins   PT G Codes:          Sevag Shearn 08/28/2014, 2:26 PM  [facility] PT [phone]

## 2014-08-28 NOTE — Progress Notes (Signed)
Inpatient Diabetes Program Recommendations  AACE/ADA: New Consensus Statement on Inpatient Glycemic Control (2013)  Target Ranges:  Prepandial:   less than 140 mg/dL      Peak postprandial:   less than 180 mg/dL (1-2 hours)      Critically ill patients:  140 - 180 mg/dL  Results for [name] [name], [name] [name] (MRN [id number]) as of 08/28/2014 09:47  Ref. Range 08/28/2014 08:30  Glucose Latest Range: 70-99 mg/dL 39 (LL)   Inpatient Diabetes Program Recommendations Insulin - Basal: decrease Lantus to 5 units   Thank you  Piedad ClimesGina Parminder Cupples BSN, RN,CDE Inpatient Diabetes Coordinator [phone] (team pager)

## 2014-08-29 LAB — GLUCOSE, CAPILLARY
GLUCOSE-CAPILLARY: 191 mg/dL — AB (ref 70–99)
Glucose-Capillary: 263 mg/dL — ABNORMAL HIGH (ref 70–99)
Glucose-Capillary: 212 mg/dL — ABNORMAL HIGH (ref 70–99)

## 2014-08-29 MED ORDER — DEXTROSE 5 % IV SOLN: 1.0000 g | INTRAVENOUS | Status: AC

## 2014-08-29 MED ORDER — VANCOMYCIN HCL 1000 MG IV SOLR: 1000.0000 mg | INTRAVENOUS | Status: AC

## 2014-08-29 MED ORDER — ENSURE PUDDING PO PUDG: 1.0000 | Freq: Two times a day (BID) | ORAL | Status: AC

## 2014-08-29 MED ORDER — HEPARIN SOD (PORK) LOCK FLUSH 100 UNIT/ML IV SOLN
250.0000 [IU] | INTRAVENOUS | Status: AC | PRN
  Administered 2014-08-29: 250 [IU]

## 2014-08-29 NOTE — Discharge Summary (Signed)
Physician Discharge Summary  [name] [name]:[id number] DOB: 03/06/1941 DOA: 08/26/2014  PCP: Salena Saner., MD  Admit date: 08/26/2014 Discharge date: 08/29/2014  Time spent: >45 minutes  Recommendations for Outpatient Follow-up:  1. CBC in 1 wk to follow up on Hgb of 7.9 2. Re-assess need for O2 once pneumonia has been treated - currently requiring 2 L   Discharge Condition: stable Diet recommendation: heart healthy, diabetic diet- nectar thick liquids  Discharge Diagnoses:  Principal Problem:   HCAP (healthcare-associated pneumonia) Active Problems:   Hypertension   DM type 2, uncontrolled, with renal complications   Hypothyroidism   History of present illness:  [name] [name] is a 73 y.o. female with past medical history of recent CVA, diabetes mellitus, CAD, hypothyroidism, hypertension he was brought to the hospital for confusion, cough and shortness of breath. She is admitted for pneumonia with acute delirium. Please see H and P for further details of admission  Hospital Course:  Principal Problem:  HCAP bilateral lower lower lobes- noted on x-ray on admission  -continue vancomycin and cefepime x 7 days- last dose will be 10/27- can either d/c antibiotics completely at this time or switch to orals depending upon clinical evidence of improvement - question aspiration in relation to dysphagia from recent CVA- see below -she pulled out her IV- PICC line placed 10/22 - please remove after course of antibiotics completed -Requiring 2 L of oxygen to keep her pulse ox above 90%   Active Problems:  Lethargy/delirium  -Possibly do to underlying acute infection- improved -  Not sure of her baseline but she remains confused - there has not been any significant agitation after the first 24 hrs of admission  Dysphagia - has had this in relation to recent CVA and had a PEG which was removed and pt transitioned to a dysphagia diet - SLP evaluated- recommended to be on  Dysphagia 1 diet with nectar thick liquids which she appears to be tolerating without difficulty  Hypertension  -cont home meds   DM type 2, uncontrolled, with renal complications  -had some hypoglycemia yesterday and therefore Lantus was decreased- she was eating poorly at that time as well - as sugars are now increasing and her appetite has improved, will resume Lantus at home dose   Anemia  - Hgb at 7.9-likely dilutional- recheck as outpt in 1 wk  Hypothyroidism  -Continue Synthroid   CKD 3  - stable   Procedures:  none  Consultations:  none  Discharge Exam: Filed Weights   08/28/14 0450 08/28/14 0607 08/29/14 0349  Weight: 66 kg (145 lb 8.1 oz) 66 kg (145 lb 8.1 oz) 68.9 kg (151 lb 14.4 oz)   Filed Vitals:   08/29/14 1122  BP: 146/55  Pulse: 73  Temp:   Resp: 18    General: AAO x 3, no distress- confused to time- knows she is at [facility]" but does not know why Cardiovascular: RRR, no murmurs  Respiratory: clear to auscultation bilaterally GI: soft, non-tender, non-distended, bowel sound positive  Discharge Instructions You were cared for by a hospitalist during your hospital stay. If you have any questions about your discharge medications or the care you received while you were in the hospital after you are discharged, you can call the unit and asked to speak with the hospitalist on call if the hospitalist that took care of you is not available. Once you are discharged, your primary care physician will handle any further medical issues. Please note that NO  REFILLS for any discharge medications will be authorized once you are discharged, as it is imperative that you return to your primary care physician (or establish a relationship with a primary care physician if you do not have one) for your aftercare needs so that they can reassess your need for medications and monitor your lab values.     Medication List    ASK your doctor about these medications        acetaminophen 325 MG tablet  Commonly known as:  TYLENOL  Take 650 mg by mouth every 6 (six) hours as needed for mild pain.     albuterol (2.5 MG/3ML) 0.083% nebulizer solution  Commonly known as:  PROVENTIL  Take 2.5 mg by nebulization every 6 (six) hours as needed for wheezing or shortness of breath.     atorvastatin 40 MG tablet  Commonly known as:  LIPITOR  Take 40 mg by mouth daily.     budesonide-formoterol 80-4.5 MCG/ACT inhaler  Commonly known as:  SYMBICORT  Inhale 2 puffs into the lungs 2 (two) times daily.     carvedilol 25 MG tablet  Commonly known as:  COREG  Take 25 mg by mouth 2 (two) times daily with a meal.     cloNIDine 0.3 mg/24hr patch  Commonly known as:  CATAPRES - Dosed in mg/24 hr  Place 0.3 mg onto the skin once a week. On friday     dipyridamole-aspirin 200-25 MG per 12 hr capsule  Commonly known as:  AGGRENOX  Take 1 capsule by mouth 2 (two) times daily.     hydrALAZINE 100 MG tablet  Commonly known as:  APRESOLINE  Take 100 mg by mouth 3 (three) times daily.     insulin aspart 100 UNIT/ML injection  Commonly known as:  novoLOG  Inject 0-15 Units into the skin 4 (four) times daily -  before meals and at bedtime. 70-200= 0 units; 201-250= 2 units; 251-300= 4 units; 301-350= 6 units; 351-400= 8 units; >400 give 10 units and recheck CBG in 2 hours. If CBG continues >400= call MD     insulin glargine 100 UNIT/ML injection  Commonly known as:  LANTUS  Inject 13 Units into the skin at bedtime.     isosorbide dinitrate 10 MG tablet  Commonly known as:  ISORDIL  Take 10 mg by mouth 3 (three) times daily.     levothyroxine 50 MCG tablet  Commonly known as:  SYNTHROID, LEVOTHROID  Take 50 mcg by mouth daily before breakfast.     senna-docusate 8.6-50 MG per tablet  Commonly known as:  Senokot-S  Take 1 tablet by mouth at bedtime as needed for mild constipation.       Allergies  Allergen Reactions  . Erythromycin Shortness Of Breath  .  Iodine Hives  . Penicillins Hives  . Latex Hives and Itching      The results of significant diagnostics from this hospitalization (including imaging, microbiology, ancillary and laboratory) are listed below for reference.    Significant Diagnostic Studies: Ct Abdomen Wo Contrast  08-30-14   CLINICAL DATA:  Evaluate anatomy prior to potential percutaneous gastrostomy tube placement.  EXAM: CT ABDOMEN WITHOUT CONTRAST  TECHNIQUE: Multidetector CT imaging of the abdomen was performed following the standard protocol without IV contrast.  COMPARISON:  10/27/2010; abdominal radiograph - 07/30/2014  FINDINGS: The lack of intravenous contrast limits the ability to evaluate solid abdominal organs.  A minimal amount of enteric contrast is seen within the colon. The splenic flexure  of the colon is interposed between the anterior abdominal wall and the ventral wall of the stomach (representative coronal image 12, series 206, axial image 29, series 21).  The bowel is normal in course and caliber without wall thickening. Normal noncontrast appearance of the appendix. No pneumoperitoneum, pneumatosis or portal venous gas.  Normal hepatic contour. There are multiple radiopaque gallstones lying dependently within the gallbladder.  Normal noncontrast appearance of the bilateral kidneys. No radiopaque gallstones. There is a minimal amount of grossly symmetric likely age and body habitus related perinephric stranding. No urinary obstruction. There is mild diffuse thickening of the left adrenal gland without discrete nodule. Normal noncontrast appearance of the right adrenal gland, pancreas and spleen.  Atherosclerotic plaque within a normal caliber abdominal aorta. No bulky retroperitoneal or mesenteric lymphadenopathy on this noncontrast examination.  Limited visualization of the lower thorax demonstrates trace bilateral effusions with associated bibasilar opacities, likely atelectasis.  Borderline cardiomegaly. Coronary  artery calcifications. No pericardial effusion.  No acute or aggressive osseus abnormalities. There is mild multilevel DDD within the lower lumbar spine thoracic spine. There is mild (approximately 7 mm) of anterolisthesis of L4 upon L5 without associated pars defect. Bilateral facet degenerative change in the lower lumbar spine.  There is diffuse body wall anasarca.  IMPRESSION: 1. The transverse colon overlies the stomach and as such, would recommend either enteric administration of barium versus a retrograde enema prior to consideration of attempted percutaneous gastrostomy tube placement. 2. Small bilateral effusions with body wall anasarca - constellation of findings worrisome for pulmonary edema.   Electronically Signed   By: Sandi Mariscal M.D.   On: 08/12/2014 21:01   Ct Head Wo Contrast  08/06/2014   CLINICAL DATA:  Code stroke.  Facial droop.  EXAM: CT HEAD WITHOUT CONTRAST  TECHNIQUE: Contiguous axial images were obtained from the base of the skull through the vertex without intravenous contrast.  COMPARISON:  08/04/2014  FINDINGS: Diffuse cerebral atrophy. Ventricular dilatation consistent with central atrophy. Low-attenuation changes throughout the deep white matter consistent with small vessel ischemia. Old lacunar infarcts in the right caudate lobe, left thalamus, and left cerebellum. No change since previous study. No mass effect or midline shift. No abnormal extra-axial fluid collections. Gray-white matter junctions are distinct. Basal cisterns are not effaced. No evidence of acute intracranial hemorrhage. No depressed skull fractures. Small retention cyst in the sphenoid sinus. Mastoid air cells are not opacified.  IMPRESSION: No acute intracranial abnormalities. Chronic atrophy and small vessel ischemic changes with multiple old lacunar infarcts.  These results were called by telephone at the time of interpretation on 08/06/2014 at 10:52 pm to Dr. Fredia Sorrow , who verbally acknowledged these  results.   Electronically Signed   By: Lucienne Capers M.D.   On: 08/06/2014 22:54   Ct Head Wo Contrast  08/04/2014   CLINICAL DATA:  Facial droop and left upper extremity paresis.  EXAM: CT HEAD WITHOUT CONTRAST  TECHNIQUE: Contiguous axial images were obtained from the base of the skull through the vertex without intravenous contrast.  COMPARISON:  CT scan of July 24, 2014.  FINDINGS: Bony calvarium appears intact. Mild diffuse cortical atrophy is noted. Moderate chronic ischemic white matter disease is noted. Stable calcifications are noted in the basal ganglia bilaterally. No mass effect or midline shift is noted. Ventricular size is within normal limits. There is no evidence of mass lesion, hemorrhage or acute infarction.  IMPRESSION: Mild diffuse cortical atrophy. Moderate chronic ischemic white matter disease. No acute intracranial abnormality  seen.   Electronically Signed   By: Sabino Dick M.D.   On: 08/04/2014 14:02   Mr Jodene Nam Head Wo Contrast  08/08/2014   CLINICAL DATA:  Sudden onset of inability to speak beginning yesterday. Symptoms have now resolved. Stroke risk factors include previous stroke, diabetes, hypertension, hyperlipidemia, and chronic renal disease.  EXAM: MRI HEAD WITHOUT CONTRAST  MRA HEAD WITHOUT CONTRAST  TECHNIQUE: Multiplanar, multiecho pulse sequences of the brain and surrounding structures were obtained without intravenous contrast. Angiographic images of the head were obtained using MRA technique without contrast.  COMPARISON:  CT head 08/06/2014.  MR head 04/24/14  FINDINGS: MRI HEAD FINDINGS  Subcentimeter acute infarct in the LEFT corpus callosum just lateral and superior to the genu. This lies within the LEFT ACA territory. No other areas of acute infarction are identified. No hemorrhage, mass lesion, or extra-axial fluid.  Hydrocephalus ex vacuo. Extensive small vessel disease throughout the periventricular and subcortical white matter. Widespread remote basal  ganglia, thalamic, and deep white matter infarcts some of which were acute on the previous study from June. Partial empty sella. Cervical spondylosis. No osseous lesions. BILATERAL cataract extraction. Chronic sinus and mastoid fluid.  MRA HEAD FINDINGS  Misregistration of the process images precludes accurate stenosis evaluation. There is gross patency of the internal carotid arteries and basilar artery with RIGHT vertebral dominant. Suspected moderate RIGHT cavernous ICA disease and mild LEFT cavernous ICA disease suspected mild disease of the distal LEFT vertebral. There is no proximal flow reducing lesion of the anterior, middle, or posterior cerebral arteries. Both distal anterior cerebral arteries appear patent.  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  Advanced atrophy, small vessel disease, and widespread remote deep nuclei and deep white matter infarcts.  Patient motion results in misregistration of the MRA exam. Gross patency of the intracranial vasculature, including both anterior cerebral arteries, is established however.   Electronically Signed   By: Rolla Flatten M.D.   On: 08/08/2014 09:54   Mr Brain Wo Contrast  08/08/2014   CLINICAL DATA:  Sudden onset of inability to speak beginning yesterday. Symptoms have now resolved. Stroke risk factors include previous stroke, diabetes, hypertension, hyperlipidemia, and chronic renal disease.  EXAM: MRI HEAD WITHOUT CONTRAST  MRA HEAD WITHOUT CONTRAST  TECHNIQUE: Multiplanar, multiecho pulse sequences of the brain and surrounding structures were obtained without intravenous contrast. Angiographic images of the head were obtained using MRA technique without contrast.  COMPARISON:  CT head 08/06/2014.  MR head 04/24/14  FINDINGS: MRI HEAD FINDINGS  Subcentimeter acute infarct in the LEFT corpus callosum just lateral and superior to the genu. This lies within the LEFT ACA territory. No other areas of acute  infarction are identified. No hemorrhage, mass lesion, or extra-axial fluid.  Hydrocephalus ex vacuo. Extensive small vessel disease throughout the periventricular and subcortical white matter. Widespread remote basal ganglia, thalamic, and deep white matter infarcts some of which were acute on the previous study from June. Partial empty sella. Cervical spondylosis. No osseous lesions. BILATERAL cataract extraction. Chronic sinus and mastoid fluid.  MRA HEAD FINDINGS  Misregistration of the process images precludes accurate stenosis evaluation. There is gross patency of the internal carotid arteries and basilar artery with RIGHT vertebral dominant. Suspected moderate RIGHT cavernous ICA disease and mild LEFT cavernous ICA disease suspected mild disease of the distal LEFT vertebral. There is no proximal flow reducing lesion of the anterior, middle, or posterior cerebral arteries. Both distal anterior cerebral arteries appear patent.  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  Advanced atrophy, small vessel disease, and widespread remote deep nuclei and deep white matter infarcts.  Patient motion results in misregistration of the MRA exam. Gross patency of the intracranial vasculature, including both anterior cerebral arteries, is established however.   Electronically Signed   By: Rolla Flatten M.D.   On: 08/08/2014 09:54   Ir Fluoro Guide Cv Line Right  08/13/2014   INDICATION: Poor venous access. In need of intravenous access for medication administration and blood draws. 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 Request made for placement of a percutaneous gastrostomy tube.  EXAM: 1. ULTRASOUND AND FLUOROSCOPIC GUIDED PICC LINE INSERTION 2. FLUOROSCOPIC GUIDED RECTAL ADMINISTRATION OF BARIUM TO OPACIFY THE TRANSVERSE COLON.  COMPARISON:  CT abdomen pelvis - 08/12/2014  MEDICATIONS: None.  CONTRAST:  None  FLUOROSCOPY TIME:  3 min, 18 seconds  COMPLICATIONS:  None immediate  TECHNIQUE: The procedure, risks, benefits, and alternatives were explained to the patient's son and informed written consent was obtained. A timeout was performed prior to the initiation of the procedure.  The right upper extremity was prepped with chlorhexidine in a sterile fashion, and a sterile drape was applied covering the operative field. Maximum barrier sterile technique with sterile gowns and gloves were used for the procedure. A timeout was performed prior to the initiation of the procedure. Local anesthesia was provided with 1% lidocaine.  Under direct ultrasound guidance, the right basilic vein was accessed with a micropuncture kit after the overlying soft tissues were anesthetized with 1% lidocaine. An ultrasound image was saved for documentation purposes. A guidewire was advanced to the level of the superior caval-atrial junction for measurement purposes and the PICC line was cut to length. A peel-away sheath was placed and a 36 cm, 5 [place], dual lumen was inserted to level of the superior caval-atrial junction. A post procedure spot fluoroscopic was obtained. The catheter easily aspirated and flushed and was sutured in place. A dressing was placed. The patient tolerated the procedure well without immediate post procedural complication.  At this point, attention was paid towards attempted percutaneous gastrostomy tube placement.  Multiple spot fluoroscopic images were obtained of the right upper abdominal quadrant appears to demonstrate the colon overlying the ventral aspect of the stomach as was demonstrated on preceding abdominal CT.  As such, barium was administered via the rectum with adequate opacification of the transverse colon, confirming this finding. As such, attempted percutaneous gastrostomy tube placement was not performed  FINDINGS: After catheter placement, the PICC tip lies within the superior cavoatrial junction. The catheter aspirates and flushes normally and is ready  for immediate use.  The splenic flexure of the colon overlies the ventral wall of the stomach without a safe percutaneous window for percutaneous gastrostomy tube placement.  IMPRESSION: 1. Successful ultrasound and fluoroscopic guided placement of a right basilic vein approach, cm, 5 French, dual lumen PICC with tip at the superior caval-atrial junction. The PICC line is ready for immediate use. 2. No safe percutaneous window for gastrostomy tube placement.  PLAN: This patient could be referred to surgery for a surgical placed gastrostomy tube as clinically indicated.   Electronically Signed   By: Sandi Mariscal M.D.   On: 08/13/2014 11:09   Ir Fluoro Rm 30-60 Min  08/13/2014   INDICATION: Poor venous access. In need of intravenous access for medication administration and blood draws. 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 Request made  for placement of a percutaneous gastrostomy tube.  EXAM: 1. ULTRASOUND AND FLUOROSCOPIC GUIDED PICC LINE INSERTION 2. FLUOROSCOPIC GUIDED RECTAL ADMINISTRATION OF BARIUM TO OPACIFY THE TRANSVERSE COLON.  COMPARISON:  CT abdomen pelvis - 08/12/2014  MEDICATIONS: None.  CONTRAST:  None  FLUOROSCOPY TIME:  3 min, 18 seconds  COMPLICATIONS: None immediate  TECHNIQUE: The procedure, risks, benefits, and alternatives were explained to the patient's son and informed written consent was obtained. A timeout was performed prior to the initiation of the procedure.  The right upper extremity was prepped with chlorhexidine in a sterile fashion, and a sterile drape was applied covering the operative field. Maximum barrier sterile technique with sterile gowns and gloves were used for the procedure. A timeout was performed prior to the initiation of the procedure. Local anesthesia was provided with 1% lidocaine.  Under direct ultrasound guidance, the right basilic vein was accessed with a micropuncture kit after the overlying soft tissues were anesthetized with 1% lidocaine. An  ultrasound image was saved for documentation purposes. A guidewire was advanced to the level of the superior caval-atrial junction for measurement purposes and the PICC line was cut to length. A peel-away sheath was placed and a 36 cm, 5 [place], dual lumen was inserted to level of the superior caval-atrial junction. A post procedure spot fluoroscopic was obtained. The catheter easily aspirated and flushed and was sutured in place. A dressing was placed. The patient tolerated the procedure well without immediate post procedural complication.  At this point, attention was paid towards attempted percutaneous gastrostomy tube placement.  Multiple spot fluoroscopic images were obtained of the right upper abdominal quadrant appears to demonstrate the colon overlying the ventral aspect of the stomach as was demonstrated on preceding abdominal CT.  As such, barium was administered via the rectum with adequate opacification of the transverse colon, confirming this finding. As such, attempted percutaneous gastrostomy tube placement was not performed  FINDINGS: After catheter placement, the PICC tip lies within the superior cavoatrial junction. The catheter aspirates and flushes normally and is ready for immediate use.  The splenic flexure of the colon overlies the ventral wall of the stomach without a safe percutaneous window for percutaneous gastrostomy tube placement.  IMPRESSION: 1. Successful ultrasound and fluoroscopic guided placement of a right basilic vein approach, cm, 5 French, dual lumen PICC with tip at the superior caval-atrial junction. The PICC line is ready for immediate use. 2. No safe percutaneous window for gastrostomy tube placement.  PLAN: This patient could be referred to surgery for a surgical placed gastrostomy tube as clinically indicated.   Electronically Signed   By: Sandi Mariscal M.D.   On: 08/13/2014 11:09   Ir US Guide Vasc Access Right  08/14/2014   INDICATION: Poor venous access. In need of  intravenous access for medication administration and blood draws. 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 Request made for placement of a percutaneous gastrostomy tube.  EXAM: 1. ULTRASOUND AND FLUOROSCOPIC GUIDED PICC LINE INSERTION 2. FLUOROSCOPIC GUIDED RECTAL ADMINISTRATION OF BARIUM TO OPACIFY THE TRANSVERSE COLON.  COMPARISON:  CT abdomen pelvis - 08/12/2014  MEDICATIONS: None.  CONTRAST:  None  FLUOROSCOPY TIME:  3 min, 18 seconds  COMPLICATIONS: None immediate  TECHNIQUE: The procedure, risks, benefits, and alternatives were explained to the patient's son and informed written consent was obtained. A timeout was performed prior to the initiation of the procedure.  The right upper extremity was prepped with chlorhexidine in a sterile fashion, and a sterile drape was  applied covering the operative field. Maximum barrier sterile technique with sterile gowns and gloves were used for the procedure. A timeout was performed prior to the initiation of the procedure. Local anesthesia was provided with 1% lidocaine.  Under direct ultrasound guidance, the right basilic vein was accessed with a micropuncture kit after the overlying soft tissues were anesthetized with 1% lidocaine. An ultrasound image was saved for documentation purposes. A guidewire was advanced to the level of the superior caval-atrial junction for measurement purposes and the PICC line was cut to length. A peel-away sheath was placed and a 36 cm, 5 [place], dual lumen was inserted to level of the superior caval-atrial junction. A post procedure spot fluoroscopic was obtained. The catheter easily aspirated and flushed and was sutured in place. A dressing was placed. The patient tolerated the procedure well without immediate post procedural complication.  At this point, attention was paid towards attempted percutaneous gastrostomy tube placement.  Multiple spot fluoroscopic images were obtained of the right upper abdominal quadrant appears to  demonstrate the colon overlying the ventral aspect of the stomach as was demonstrated on preceding abdominal CT.  As such, barium was administered via the rectum with adequate opacification of the transverse colon, confirming this finding. As such, attempted percutaneous gastrostomy tube placement was not performed  FINDINGS: After catheter placement, the PICC tip lies within the superior cavoatrial junction. The catheter aspirates and flushes normally and is ready for immediate use.  The splenic flexure of the colon overlies the ventral wall of the stomach without a safe percutaneous window for percutaneous gastrostomy tube placement.  IMPRESSION: 1. Successful ultrasound and fluoroscopic guided placement of a right basilic vein approach, cm, 5 French, dual lumen PICC with tip at the superior caval-atrial junction. The PICC line is ready for immediate use. 2. No safe percutaneous window for gastrostomy tube placement.  PLAN: This patient could be referred to surgery for a surgical placed gastrostomy tube as clinically indicated.   Electronically Signed   By: Sandi Mariscal M.D.   On: 08/13/2014 11:09   Dg Chest Port 1 View  08/27/2014   CLINICAL DATA:  PICC placement.  EXAM: PORTABLE CHEST - 1 VIEW  COMPARISON:  Single view of the chest 08/27/2014 at 12:14 a.m.  FINDINGS: A new right PICC is in place with the tip projecting in the mid to lower superior vena cava. Left basilar airspace disease is again seen. The right lung is clear. Heart size is upper normal. Loop recorder is noted. No pneumothorax is identified.  IMPRESSION: Tip of right PICC projects over the mid to lower superior vena cava.  Left basilar airspace disease.   Electronically Signed   By: Inge Rise M.D.   On: 08/27/2014 12:31   Dg Chest Port 1 View  08/27/2014   CLINICAL DATA:  73 year old female with acute shortness of Breath. Initial encounter. Current history of recent infarct.  EXAM: PORTABLE CHEST - 1 VIEW  COMPARISON:  08/06/2014  and earlier.  FINDINGS: Portable AP semi upright view at 0014 hr. New retrocardiac opacity obscuring the left hemidiaphragm. Stable cardiac size and mediastinal contours. Cardiac event recorder. Mild veiling opacity also at the right lung base. No pneumothorax or pulmonary edema.  IMPRESSION: New left greater than right lower lobe opacity, that on the left compatible with pneumonia or aspiration in this setting.   Electronically Signed   By: Lars Pinks M.D.   On: 08/27/2014 00:24   Dg Chest Port 1 View  08/07/2014  CLINICAL DATA:  Short of breath. Labored in shallow breathing. Unresponsive.  EXAM: PORTABLE CHEST - 1 VIEW  COMPARISON:  07/29/2014  FINDINGS: Probable linear atelectasis in the left mid lung. Normal heart size and pulmonary vascularity. No focal airspace disease in the lungs. No blunting of costophrenic angles. No pneumothorax. Mediastinal contours appear intact.  IMPRESSION: No active disease.   Electronically Signed   By: Lucienne Capers M.D.   On: 08/07/2014 00:02   Dg Abd Portable 1v  08/15/2014   CLINICAL DATA:  Feeding tube placement.  EXAM: PORTABLE ABDOMEN - 1 VIEW  COMPARISON:  None.  FINDINGS: Feeding tube is seen with tip overlying proximal stomach. No evidence of dilated bowel loops. Residual contrast noted within the colon.  IMPRESSION: Feeding tube tip overlies the proximal stomach.   Electronically Signed   By: Earle Gell M.D.   On: 08/15/2014 16:20   Dg Abd Portable 1v  07/30/2014   CLINICAL DATA:  Feeding tube placement  EXAM: PORTABLE ABDOMEN - 1 VIEW  COMPARISON:  Portable exam 1642 hr compared to 07/28/2014  FINDINGS: Feeding tube coiled at mid stomach with tip reflected to fundus.  Nonobstructive bowel gas pattern.  Minimal atelectasis and questionable fusions at lung bases.  Loop recorder projects over chest.  IMPRESSION: Feeding tube coiled at mid to proximal stomach.   Electronically Signed   By: Lavonia Dana M.D.   On: 07/30/2014 16:55   Dg Swallowing Func-speech  Pathology  08/01/2014   Katherene Ponto Deblois, CCC-SLP     08/01/2014  1:43 PM Objective Swallowing Evaluation: Modified Barium Swallowing Study   Patient Details  Name: BRYLAN SEUBERT MRN: [id number] Date of Birth: Apr 15, 1941  Today's Date: 08/01/2014 Time: 1300-1330 SLP Time Calculation (min): 30 min  Past Medical History:  Past Medical History  Diagnosis Date  . Hypertension   . Diabetes mellitus   . Chronic renal insufficiency   . Hyperlipidemia   . Osteopenia   . CVA (cerebral infarction)   . Thyroid disease    Past Surgical History:  Past Surgical History  Procedure Laterality Date  . Eye surgery    . 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 Tee without cardioversion N/A 03/23/2014    Procedure: TRANSESOPHAGEAL ECHOCARDIOGRAM (TEE);  Surgeon:  Dorothy Spark, MD;  Location: [facility] ENDOSCOPY;  Service:  Cardiovascular;  Laterality: N/A;   HPI:  [name] with COPD, CVA, CK D., Diabetes admitted 9/18  with acute respiratory distress , hypercarbic respiratory  failure, DKA with glucose of 802 and anion gap of 19 , requiring  mechanical ventilation. Chest x-ray showed left upper lobe  infiltrate. Was placed on BiPAP initially, but unresponsive on  arrival to the ICU, hence intubated. She had recent admit from  8/6 to 814 for hypercarbic failure that improved with BiPAP-dc'd  to NH , then home since 9/14. 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 Patient has been seen by SLP  during previous admissions and has been observed with a delayed  oral phase and suspected delayed swallow initiation with s/s of  aspiration with thin liquids via straw. Most frequent diet  recommendations for dysphagia 1 solids, thin liquids.      Assessment / Plan / Recommendation Clinical Impression  Dysphagia Diagnosis: Moderate oral phase dysphagia;Mild  pharyngeal phase dysphagia Clinical impression: Pt presents with a primary cognitive based,  oral dyspahgia  with lingual pumping and piecemeal  transit of PO,  leading to multiple swallows per bolus. Swallow response is only  slightly delayed but mild base of tongue weakness results in  trace vallecular residuals and very trace penetration after the  swallow with nectar thick liquids. Pt senses penetrate and expels  with a soft throat clear. Given overt aspiration of thin at  bedside and pts AMS, thin liquids not tested though expect pt  will be able to upgrade as mentation improves. Pt to initiate dys  1 (puree) diet and nectar thick liquids. SLP will follow for  tolerance.      Treatment Recommendation  Therapy as outlined in treatment plan below    Diet Recommendation Dysphagia 1 (Puree);Nectar-thick liquid   Liquid Administration via: Cup;Straw Medication Administration: Whole meds with puree Supervision: Staff to assist with self feeding Compensations: Slow rate;Small sips/bites Postural Changes and/or Swallow Maneuvers: Seated upright 90  degrees;Upright 30-60 min after meal    Other  Recommendations Oral Care Recommendations: Oral care BID Other Recommendations: Order thickener from pharmacy   Follow Up Recommendations  Skilled Nursing facility    Frequency and Duration min 2x/week  2 weeks   Pertinent Vitals/Pain NA    SLP Swallow Goals     General HPI: [name] with COPD, CVA, CK D., Diabetes  admitted 9/18 with acute respiratory distress , hypercarbic  respiratory failure, DKA with glucose of 802 and anion gap of 19  , requiring mechanical ventilation. Chest x-ray showed left upper  lobe infiltrate. Was placed on BiPAP initially, but unresponsive  on arrival to the ICU, hence intubated. She had recent admit from  8/6 to 814 for hypercarbic failure that improved with BiPAP-dc'd  to NH , then home since 9/14. 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 Patient has been seen by SLP  during previous admissions and has been observed with a delayed  oral phase and suspected delayed swallow  initiation with s/s of  aspiration with thin liquids via straw. Most frequent diet  recommendations for dysphagia 1 solids, thin liquids.  Type of Study: Modified Barium Swallowing Study Reason for Referral: Objectively evaluate swallowing function Previous Swallow Assessment: see HPI Diet Prior to this Study: NPO Temperature Spikes Noted: No Respiratory Status: Room air History of Recent Intubation: Yes Length of Intubations (days): 2 days Date extubated: 07/29/14 Behavior/Cognition: Alert;Cooperative;Requires cueing Oral Cavity - Dentition: Edentulous Oral Motor / Sensory Function: Within functional limits Self-Feeding Abilities: Total assist Patient Positioning: Upright in chair Baseline Vocal Quality: Clear Volitional Cough: Weak Volitional Swallow: Able to elicit Pharyngeal Secretions: Not observed secondary MBS    Reason for Referral Objectively evaluate swallowing function   Oral Phase Oral Preparation/Oral Phase Oral Phase: Impaired Oral - Nectar Oral - Nectar Cup: Lingual pumping;Reduced posterior  propulsion;Piecemeal swallowing;Delayed oral transit Oral - Nectar Straw: Lingual pumping;Reduced posterior  propulsion;Piecemeal swallowing;Delayed oral transit Oral - Solids Oral - Puree: Lingual pumping;Reduced posterior  propulsion;Piecemeal swallowing;Delayed oral transit   Pharyngeal Phase Pharyngeal Phase Pharyngeal Phase: Impaired Pharyngeal - Nectar Pharyngeal - Nectar Cup: Reduced tongue base  retraction;Penetration/Aspiration after swallow;Pharyngeal  residue - valleculae Penetration/Aspiration details (nectar cup): Material enters  airway, CONTACTS cords then ejected out;Material does not enter  airway Pharyngeal - Nectar Straw: Reduced tongue base  retraction;Penetration/Aspiration after swallow;Pharyngeal  residue - valleculae Penetration/Aspiration details (nectar straw): Material does not  enter airway;Material enters airway, CONTACTS cords then ejected  out Pharyngeal - Solids Pharyngeal - Puree:  Reduced tongue base retraction;Pharyngeal  residue - valleculae Penetration/Aspiration details (  puree): Material does not enter  airway  Cervical Esophageal Phase    GO    Cervical Esophageal Phase Cervical Esophageal Phase: Impaired Cervical Esophageal Phase - Comment Cervical Esophageal Comment: Appearance of slow transit, stasis  through GE junction        [employer], MA CCC-SLP [phone]  DeBlois, Katherene Ponto 08/01/2014, 1:42 PM     Microbiology: Recent Results (from the past 240 hour(s))  CULTURE, BLOOD (ROUTINE X 2)     Status: None   Collection Time    08/27/14 12:45 AM      Result Value Ref Range Status   Specimen Description BLOOD RIGHT ARM   Final   Special Requests     Final   Value: BOTTLES DRAWN AEROBIC AND ANAEROBIC 10CC BLUE 5CC RED   Culture  Setup Time     Final   Value: 08/27/2014 03:44     Performed at [facility]   Culture     Final   Value:        BLOOD CULTURE RECEIVED NO GROWTH TO DATE CULTURE WILL BE HELD FOR 5 DAYS BEFORE ISSUING A FINAL NEGATIVE REPORT     Performed at [facility]   Report Status PENDING   Incomplete  CULTURE, BLOOD (ROUTINE X 2)     Status: None   Collection Time    08/27/14 12:55 AM      Result Value Ref Range Status   Specimen Description BLOOD LEFT ARM   Final   Special Requests BOTTLES DRAWN AEROBIC ONLY 10CC   Final   Culture  Setup Time     Final   Value: 08/27/2014 03:44     Performed at [facility]   Culture     Final   Value:        BLOOD CULTURE RECEIVED NO GROWTH TO DATE CULTURE WILL BE HELD FOR 5 DAYS BEFORE ISSUING A FINAL NEGATIVE REPORT     Performed at [facility]   Report Status PENDING   Incomplete  MRSA PCR SCREENING     Status: None   Collection Time    08/27/14  4:03 AM      Result Value Ref Range Status   MRSA by PCR NEGATIVE  NEGATIVE Final   Comment:            The GeneXpert MRSA Assay (FDA     approved for NASAL specimens     only), is one component of a      comprehensive MRSA colonization     surveillance program. It is not     intended to diagnose MRSA     infection nor to guide or     monitor treatment for     MRSA infections.     Labs: Basic Metabolic Panel:  Recent Labs Lab 08/27/14 0011 08/27/14 0609 08/28/14 0830  NA 143 145 148*  K 4.2 4.6 4.1  CL 109 110 112  CO2 '25 26 28  ' GLUCOSE 209* 227* 39*  BUN '15 15 14  ' CREATININE 1.51* 1.48* 1.37*  CALCIUM 8.5 8.5 8.4   Liver Function Tests:  Recent Labs Lab 08/27/14 0011 08/27/14 0609  AST 14 12  ALT 10 9  ALKPHOS 71 63  BILITOT <0.2* <0.2*  PROT 5.8* 5.6*  ALBUMIN 2.4* 2.3*   No results found for this basename: LIPASE, AMYLASE,  in the last 168 hours No results found for this basename: AMMONIA,  in the last 168 hours CBC:  Recent Labs Lab 08/27/14 0011 08/27/14 0609 08/28/14 0830  WBC 4.3 4.2 3.7*  NEUTROABS  --  2.7  --   HGB 8.5* 8.2* 7.9*  HCT 26.7* 25.4* 25.4*  MCV 93.7 91.7 91.7  PLT 214 208 219   Cardiac Enzymes: No results found for this basename: CKTOTAL, CKMB, CKMBINDEX, TROPONINI,  in the last 168 hours BNP: BNP (last 3 results)  Recent Labs  06/09/14 1344 06/12/14 1330 07/24/14 1000  PROBNP 1271.0* 1666.0* 6064.0*   CBG:  Recent Labs Lab 08/28/14 1035 08/28/14 1604 08/28/14 2102 08/29/14 0637 08/29/14 1120  GLUCAP 91 128* 206* 263* 212*       SignedDebbe Odea, MD [facility] 08/29/2014, 1:28 PM

## 2014-08-29 NOTE — Progress Notes (Signed)
[facility]  Transport called for pick-up.

## 2014-08-29 NOTE — Progress Notes (Signed)
[facility] with O2 and PICC line in place.

## 2014-08-29 NOTE — Plan of Care (Signed)
Problem: COPD GOLD Progrssion Goal: ABLE TO WEAN TO ROOM AIR Outcome: Adequate for Discharge Patient at baseline on 2L oxygen Goal: Able to Perform ADL's at Baseline Outcome: Adequate for Discharge Patient at baseline, maximum assist for ADLs.  Goal: Provide COPD GOLD Action Plan at Discharge Outcome: Not Applicable Date Met:  09/38/18 Patient discharged to SNF Goal: [place] Appointments for Pulmonary Clinic & PCP Arrange Discharge Appointments for Pulmonary Clinic and Primary Care Physician  Outcome: Adequate for Discharge SNF will make follow-up appointments

## 2014-08-29 NOTE — Progress Notes (Signed)
Patient is sleeping peacefully. No observable s/s of pain/discomfort noted at present time. Will continue to monitor.  Sharlene Doryanya Hasson Gaspard, RN

## 2014-08-29 NOTE — Progress Notes (Signed)
Attempted to fax prescription for cefipime on 08/29/2014 multiple times to three fax numbers provided by [facility] receptionist, fax did not go through successfully.  Called [facility] x4 this AM, no answer.  [facility], no answer at door despite ringing door bell.  Social worker on call notified, and was able to successfully fax [facility]

## 2014-08-29 NOTE — Clinical Social Work Note (Signed)
Per MD patient ready to [facility]. RN, patient/family Mauri Pole(Lessley McNeil), and facility notified of patient's DC. RN given number for report. DC packet on patient's chart. Ambulance transport will be requested by RN pending results of bladder scan. CSW signing off at this time.   Roddie McBryant Mosetta Ferdinand MSW, StrongLCSWA, DobsonLCASA, [phone]

## 2014-08-31 ENCOUNTER — Non-Acute Institutional Stay (SKILLED_NURSING_FACILITY): Payer: Commercial Managed Care - HMO | Admitting: Internal Medicine

## 2014-08-31 DIAGNOSIS — E1165 Type 2 diabetes mellitus with hyperglycemia: Secondary | ICD-10-CM

## 2014-08-31 DIAGNOSIS — J189 Pneumonia, unspecified organism: Secondary | ICD-10-CM

## 2014-08-31 DIAGNOSIS — IMO0002 Reserved for concepts with insufficient information to code with codable children: Secondary | ICD-10-CM

## 2014-08-31 DIAGNOSIS — E039 Hypothyroidism, unspecified: Secondary | ICD-10-CM

## 2014-08-31 DIAGNOSIS — E1129 Type 2 diabetes mellitus with other diabetic kidney complication: Secondary | ICD-10-CM

## 2014-08-31 DIAGNOSIS — G9340 Encephalopathy, unspecified: Secondary | ICD-10-CM

## 2014-09-01 NOTE — Progress Notes (Signed)
Patient ID: [name] [name], female   DOB: Mar 15, 1941, 73 y.o.   MRN: [id number]           PROGRESS NOTE  DATE: 08/26/2014      [facility]  LEVEL OF CARE: SNF (31)  Acute Visit   CHIEF COMPLAINT:  Manage hypernatremia, chronic kidney disease stage III, and confusion.    HISTORY OF PRESENT ILLNESS: I was requested by the staff to assess the patient regarding above problem(s):  CONFUSION:  Staff report that patient is increasingly confused.  Patient is alert and denies any acute problems.  Symptoms were noted this morning.  They cannot identify precipitating or alleviating factors.  There is no temporal relationship.  There are no other associated signs and symptoms.    HYPERNATREMIA:  On 08/25/2014:  Sodium 150.   In 07/2014:  Sodium 144.  Staff do report increasing confusion in patient.    CHRONIC KIDNEY DISEASE: The patient's chronic kidney disease is unstable.  Patient denies increasing lower extremity swelling or confusion. Last BUN and creatinine are:   On 08/25/2014:  BUN 15, creatinine 1.4.  In 07/2014:  Creatinine 1.33.  Patient is having increased confusion.    PAST MEDICAL HISTORY : Reviewed.  No changes/see problem list  CURRENT MEDICATIONS: Reviewed per MAR/see medication list  REVIEW OF SYSTEMS:  GENERAL: no change in appetite, no fatigue, no weight changes, no fever, chills or weakness RESPIRATORY: no cough, SOB, DOE,, wheezing, hemoptysis CARDIAC: no chest pain, edema or palpitations GI: no abdominal pain, diarrhea, constipation, heart burn, nausea or vomiting  PHYSICAL EXAMINATION  VS: see VS section  GENERAL: no acute distress, normal body habitus EYES: conjunctivae normal, sclerae normal, normal eye lids NECK: supple, trachea midline, no neck masses, no thyroid tenderness, no thyromegaly LYMPHATICS: no LAN in the neck, no supraclavicular LAN RESPIRATORY: breathing is even & unlabored, BS CTAB CARDIAC: RRR, no murmur,no extra heart  sounds, +2 bilateral lower extremity edema    GI: abdomen soft, normal BS, no masses, no tenderness, no hepatomegaly, no splenomegaly PSYCHIATRIC: the patient is alert & oriented to person, affect & behavior appropriate  LABS/RADIOLOGY:  On 08/25/2014:  Hemoglobin 9.2, MCV 89.    In 07/2014:  Hemoglobin 9.6.    ASSESSMENT/PLAN:  Confusion.  New problem.  Check UA, culture and sensitivities.  Part of the confusion may be secondary to hypernatremia and worsening renal functions, as well.    Hypernatremia.  New problem.  Start half-normal saline at 100 cc/hr for 2 L and then recheck.    Chronic kidney disease stage III.  Worsening problem.  Recheck after IV fluids.    Anemia of chronic kidney disease.  Unstable problem.  Hemoglobin declined.  We will monitor.    CPT CODE: [id number]           Angela CoxGayani Y Nilah Belcourt, MD [facility] [phone]

## 2014-09-01 NOTE — Progress Notes (Signed)
HISTORY & PHYSICAL  DATE: 08/31/2014   FACILITY: [facility]  LEVEL OF CARE: SNF (31)  ALLERGIES:  Allergies  Allergen Reactions  . Erythromycin Shortness Of Breath  . Iodine Hives  . Penicillins Hives  . Latex Hives and Itching    CHIEF COMPLAINT:  Manage pneumonia, DM & hypothyroidism  HISTORY OF PRESENT ILLNESS: 73 y/o AAF was hospitalized secondary to pneumonia & delirium.  After hospitalization she is readmitted to the facility for short term rehabilitation & possible long term care.  Pt is confused & is a poor historian.  PNEUMONIA: The pneumonia remains stable.  The staff deny ongoing chest pain, cough, shortness of breath, fever, chills or night sweats. No complications reported from the current antibiotic being used. Per staff pt is disoriented, has repetitive behavioral issues.  DM:pt's DM is unstable. staff deny polyuria, polydipsia & polyphagia.  No complications noted from the medication presently being used.  Last hemoglobin A1c is: 9 in 10/15.  Pt had hypoglycemic spells in the hospital.  HYPOTHYROIDISM: The hypothyroidism remains stable. No complications noted from the medications presently being used.  The staff deny constipation.  Last TSH 2.65 in 9-15.  PAST MEDICAL HISTORY :  Past Medical History  Diagnosis Date  . Hypertension   . Diabetes mellitus   . Chronic renal insufficiency   . Hyperlipidemia   . Osteopenia   . CVA (cerebral infarction)   . Thyroid disease   . Shortness of breath     PAST SURGICAL HISTORY: Past Surgical History  Procedure Laterality Date  . Eye surgery    . Loop recorder implant  03-24-2014    MDT LinQ implanted by Dr Johney Frame for cryptogenic stroke  . Tee without cardioversion N/A 03/23/2014    Procedure: TRANSESOPHAGEAL ECHOCARDIOGRAM (TEE);  Surgeon: Lars Masson, MD;  Location: [facility] ENDOSCOPY;  Service: Cardiovascular;  Laterality: N/A;    SOCIAL HISTORY:  reports that she quit smoking  about 2 months ago. Her smoking use included Cigarettes. She has a 30 pack-year smoking history. She has never used smokeless tobacco. She reports that she drinks alcohol. She reports that she does not use illicit drugs.  FAMILY HISTORY:  Family History  Problem Relation Age of Onset  . Diabetes Mother   . Hypertension Mother     CURRENT MEDICATIONS: Reviewed per MAR/see medication list  REVIEW OF SYSTEMS: Unobtainable due to confusion  PHYSICAL EXAMINATION  VS:  See VS section  GENERAL: no acute distress, moderately obese body habitus EYES: conjunctivae normal, sclerae normal, normal eye lids MOUTH/THROAT: lips without lesions,no lesions in the mouth,tongue is without lesions,uvula elevates in midline NECK: supple, trachea midline, no neck masses, no thyroid tenderness, no thyromegaly LYMPHATICS: no LAN in the neck, no supraclavicular LAN RESPIRATORY: poor effort CARDIAC: RRR, no murmur,no extra heart sounds, LLE +2 edema, RLE +1 edema GI:  ABDOMEN: abdomen soft, no BS, no masses, no tenderness  LIVER/SPLEEN: no hepatomegaly, no splenomegaly MUSCULOSKELETAL: HEAD: normal to inspection  EXTREMITIES: LEFT UPPER EXTREMITY: unable to assess RIGHT UPPER EXTREMITY:  full range of motion, normal strength & tone LEFT LOWER EXTREMITY:  Unable to assess RIGHT LOWER EXTREMITY:  Unable to assess PSYCHIATRIC: the patient is alert & disoriented , affect depressed.  Confused.  LABS/RADIOLOGY:  Labs reviewed: Basic Metabolic Panel:  Recent Labs  16/10/96 0315  07/30/14 0430  07/31/14 0400  08/03/14 1010  08/27/14 0011 08/27/14 0609 08/28/14 0830  NA 146  < >  --   < >  148*  < >  --   < > 143 145 148*  K 3.2*  < >  --   < > 3.6*  < >  --   < > 4.2 4.6 4.1  CL 105  < >  --   < > 106  < >  --   < > 109 110 112  CO2 23  < >  --   < > 28  < >  --   < > 25 26 28   GLUCOSE 89  < >  --   < > 84  < >  --   < > 209* 227* 39*  BUN 26*  < >  --   < > 31*  < >  --   < > 15 15 14     CREATININE 1.58*  < >  --   < > 1.27*  < >  --   < > 1.51* 1.48* 1.37*  CALCIUM 8.6  < >  --   < > 8.6  < >  --   < > 8.5 8.5 8.4  MG 1.4*  < > 2.0  --  2.0  --  2.0  --   --   --   --   PHOS 3.2  --   --   --   --   --   --   --   --   --   --   < > = values in this interval not displayed. Liver Function Tests:  Recent Labs  08/07/14 0726 08/27/14 0011 08/27/14 0609  AST 20 14 12   ALT 16 10 9   ALKPHOS 68 71 63  BILITOT <0.2* <0.2* <0.2*  PROT 5.9* 5.8* 5.6*  ALBUMIN 2.3* 2.4* 2.3*    Recent Labs  08/03/14 1010  AMMONIA 21   CBC:  Recent Labs  08/06/14 2230  08/07/14 0726  08/27/14 0011 08/27/14 0609 08/28/14 0830  WBC 9.4  --  9.6  < > 4.3 4.2 3.7*  NEUTROABS 7.0  --  6.7  --   --  2.7  --   HGB 9.1*  < > 9.5*  < > 8.5* 8.2* 7.9*  HCT 28.0*  < > 29.8*  < > 26.7* 25.4* 25.4*  MCV 90.6  --  93.7  < > 93.7 91.7 91.7  PLT 290  --  298  < > 214 208 219  < > = values in this interval not displayed.  Lipid Panel:  Recent Labs  04/25/14 0315 08/02/14 0300 08/07/14 0324  HDL 37* 62 57   Cardiac Enzymes:  Recent Labs  05/01/14 1250 07/24/14 1000 07/24/14 1950  TROPONINI <0.30 <0.30 <0.30   CBG:  Recent Labs  08/29/14 0637 08/29/14 1120 08/29/14 1613  GLUCAP 263* 212* 191*    Bilateral Lower Extremity Venous Duplex Evaluation  Patient:    Kathey, Simer MR #:       [id number] Study Date: 08/07/2014 Gender:     F Age:        73 Height: Weight: BSA: Pt. Status: Room:       2C07C   SONOGRAPHER  Thereasa Parkin, RVT  ATTENDING    Penny Pia [address] [id number]  Rosaura Carpenter [id number]  Reports also to:  ------------------------------------------------------------------- History and indications:  History  Diagnostic evaluation. Aphasia. Patient has PFO.  ------------------------------------------------------------------- Study information:  Study status:  Routine.  Procedure:  A  vascular evaluation  was performed with the patient in the supine position. The right common femoral, right femoral, right profunda femoral, right popliteal, right peroneal, right posterior tibial, left common femoral, left femoral, left profunda femoral, left popliteal, left peroneal, and left posterior tibial veins were studied.    Bilateral lower extremity venous duplex evaluation.     Doppler flow study including B-mode compression maneuvers of all visualized segments, color flow Doppler and selected views of pulsed wave Doppler. Birthdate:  Patient birthdate: [birth year].  Age:  Patient is 73 yr old.  Sex:  Gender: female.  Study date:  Study date: 08/07/2014. Study time: 09:55 AM.  Location:  Bedside.  Patient status: Inpatient.  Venous flow:  +--------------------------+-------+------------------------------+ Location                  OverallFlow properties                +--------------------------+-------+------------------------------+ Right common femoral      Patent Phasic; spontaneous;                                            compressible                   +--------------------------+-------+------------------------------+ Right femoral             Patent Compressible                   +--------------------------+-------+------------------------------+ Right profunda femoral    Patent Compressible                   +--------------------------+-------+------------------------------+ Right popliteal           Patent Phasic; spontaneous;                                            compressible                   +--------------------------+-------+------------------------------+ Right posterior tibial    Patent Compressible                   +--------------------------+-------+------------------------------+ Right peroneal            Patent Compressible                   +--------------------------+-------+------------------------------+ Right saphenofemoral       Patent Compressible                   junction                                                        +--------------------------+-------+------------------------------+ Left common femoral       Patent Phasic; spontaneous;                                            compressible                   +--------------------------+-------+------------------------------+ Left femoral  Patent Compressible                   +--------------------------+-------+------------------------------+ Left profunda femoral     Patent Compressible                   +--------------------------+-------+------------------------------+ Left popliteal            Patent Phasic; spontaneous;                                            compressible                   +--------------------------+-------+------------------------------+ Left posterior tibial     Patent Compressible                   +--------------------------+-------+------------------------------+ Left peroneal             Patent Compressible                   +--------------------------+-------+------------------------------+ Left saphenofemoral       Patent Compressible                   junction                                                        +--------------------------+-------+------------------------------+  ------------------------------------------------------------------- Summary:  - No evidence of deep vein thrombosis involving the right lower   extremity. - No evidence of deep vein thrombosis involving the left lower Upper Extremity Venous Duplex Study  Patient:    Christia, Domke MR #:       [id number] Study Date: 08/07/2014 Gender:     F Age:        73 Height: Weight: BSA: Pt. Status: Room:       2C07C   SONOGRAPHER  Thereasa Parkin, RVT  ATTENDING    Penny Pia [address] [phone]  Rosaura Carpenter [id number]  Reports also  to:  ------------------------------------------------------------------- History and indications:  History  Diagnostic evaluation. Aphasia. Patient has PFO.  ------------------------------------------------------------------- Study information:  Study status:  Routine.  Procedure:  The right internal jugular, right subclavian, right axillary, right basilic, right cephalic, right brachial, right radial, right ulnar, left axillary, left basilic, left cephalic, left brachial veins were studied. A vascular evaluation was performed with the patient in the supine position.    Bilateral upper extremity venous duplex study. Doppler flow study including B-mode compression maneuvers of all visualized segments, color flow Doppler and selected views of pulsed wave Doppler.  Birthdate:  Patient birthdate: [birth year]. Age:  Patient is 73 yr old.  Sex:  Gender: female.  Study date: Study date: 08/07/2014. Study time: 10:05 AM.  Location:  Bedside. Patient status:  Inpatient.  Venous flow:  +----------------------+-------+---------------------------------+ Location              OverallFlow properties                   +----------------------+-------+---------------------------------+ Right internal jugularPatent Phasic; spontaneous; compressible +----------------------+-------+---------------------------------+ Right subclavian      Patent Phasic; spontaneous; compressible +----------------------+-------+---------------------------------+ Right axillary        Patent Phasic; spontaneous; compressible +----------------------+-------+---------------------------------+  Right brachial        Patent Compressible; normal augmentation +----------------------+-------+---------------------------------+ Right cephalic        Patent Compressible                      +----------------------+-------+---------------------------------+ Right basilic         Patent Compressible                       +----------------------+-------+---------------------------------+ Left internal jugular Patent Phasic; spontaneous; compressible +----------------------+-------+---------------------------------+ Left subclavian       Patent Phasic; spontaneous; compressible +----------------------+-------+---------------------------------+ Left axillary         Patent Phasic; spontaneous; compressible +----------------------+-------+---------------------------------+ Left brachial         Patent Compressible; normal augmentation +----------------------+-------+---------------------------------+ Left cephalic         Patent Compressible                      +----------------------+-------+---------------------------------+ Left basilic          Patent Compressible                      +----------------------+-------+---------------------------------+  ------------------------------------------------------------------- Summary: No evidence of deep vein or superficial thrombosis involving the right upper extremity and left upper extremity Transthoracic Echocardiography  Patient:    Edyth, Glomb MR #:       [id number] Study Date: 08/07/2014 Gender:     F Age:        73 Height:     160 cm Weight:     75.3 kg BSA:        1.85 m^2 Pt. Status: Room:       [facility]   SONOGRAPHER  Silvano Bilis, RCS  ATTENDING    [place], [place] [address], [place]el Petrin [id number]  Lorain Childes [id number]  PERFORMING   [facility], Inpatient  cc:  ------------------------------------------------------------------- LV EF: 65% -   70%  ------------------------------------------------------------------- Indications:      TIA 435.9.  ------------------------------------------------------------------- History:   PMH:   Stroke.  Chronic obstructive pulmonary disease. Risk factors:  Former tobacco use. Hypertension. Diabetes  mellitus. Dyslipidemia.  ------------------------------------------------------------------- Study Conclusions  - Left ventricle: The cavity size was normal. Wall thickness was   normal. Systolic function was vigorous. The estimated ejection   fraction was in the range of 65% to 70%. Doppler parameters are   consistent with abnormal left ventricular relaxation (grade 1   diastolic dysfunction). Doppler parameters are consistent with   elevated mean left atrial filling pressure. - Tricuspid valve: There was mild-moderate regurgitation. - Pulmonary arteries: PA peak pressure: 53 mm Hg (S). - Pericardium, extracardiac: A trivial pericardial effusion was   identified.  Transthoracic echocardiography.  M-mode, complete 2D, spectral Doppler, and color Doppler.  Birthdate:  Patient birthdate: 1941/01/05.  Age:  Patient is 73 yr old.  Sex:  Gender: female. BMI: 29.4 kg/m^2.  Blood pressure:     160/50  Patient status: Inpatient.  Study date:  Study date: 08/07/2014. Study time: 11:57 AM.  Location:  ICU/CCU  -------------------------------------------------------------------  ------------------------------------------------------------------- Left ventricle:  The cavity size was normal. Wall thickness was normal. Systolic function was vigorous. The estimated ejection fraction was in the range of 65% to 70%. Doppler parameters are consistent with abnormal left ventricular relaxation (grade 1 diastolic dysfunction). Doppler parameters are consistent with elevated mean left atrial filling pressure.  ------------------------------------------------------------------- Aortic valve:   Mildly thickened, mildly calcified leaflets.  Doppler:  There was no regurgitation.  ------------------------------------------------------------------- Mitral valve:   Mildly thickened leaflets .  Doppler:  There was no significant regurgitation.    Peak gradient (D): 4 mm  Hg.  ------------------------------------------------------------------- Left atrium:  The atrium was normal in size.  ------------------------------------------------------------------- Right ventricle:  The cavity size was normal. Wall thickness was normal. Systolic function was normal.  ------------------------------------------------------------------- Tricuspid valve:   Doppler:  There was mild-moderate regurgitation.   ------------------------------------------------------------------- Right atrium:  The atrium was normal in size.  ------------------------------------------------------------------- Pericardium:  A trivial pericardial effusion was identified.  ------------------------------------------------------------------- Measurements   Left ventricle                         Value        Reference  LV ID, ED, PLAX chordal        (L)     37.3  mm     43 - 52  LV ID, ES, PLAX chordal                23.4  mm     23 - 38  LV fx shortening, PLAX chordal         37    %      >=29  LV PW thickness, ED                    10.7  mm     ---------  IVS/LV PW ratio, ED                    0.96         <=1.3  LV e&', lateral                         5.26  cm/s   ---------  LV E/e&', lateral                       18.48        ---------  LV e&', medial                          5.15  cm/s   ---------  LV E/e&', medial                        18.87        ---------  LV e&', average                         5.21  cm/s   ---------  LV E/e&', average                       18.67        ---------    Ventricular septum                     Value        Reference  IVS thickness, ED                      10.3  mm     ---------    Aorta  Value        Reference  Aortic root ID, ED                     25    mm     ---------    Left atrium                            Value        Reference  LA ID, A-P, ES                         27    mm     ---------  LA ID/bsa, A-P                          1.46  cm/m^2 <=2.2  LA volume, ES, 1-p A4C                 47    ml     ---------  LA volume/bsa, ES, 1-p A4C             25.4  ml/m^2 ---------  LA volume, ES, 1-p A2C                 35    ml     ---------  LA volume/bsa, ES, 1-p A2C             18.9  ml/m^2 ---------    Mitral valve                           Value        Reference  Mitral E-wave peak velocity            97.2  cm/s   ---------  Mitral A-wave peak velocity            116   cm/s   ---------  Mitral deceleration time               151   ms     150 - 230  Mitral peak gradient, D                4     mm Hg  ---------  Mitral E/A ratio, peak                 0.8          ---------    Pulmonary arteries                     Value        Reference  PA pressure, S, DP             (H)     53    mm Hg  <=30    Tricuspid valve                        Value        Reference  Tricuspid regurg peak velocity         335   cm/s   ---------  Tricuspid peak RV-RA gradient          45    mm Hg  ---------    Systemic veins  Value        Reference  Estimated CVP                          8     mm Hg  ---------    Right ventricle                        Value        Reference  RV pressure, S, DP             (H)     53    mm Hg  <=30  PORTABLE CHEST - 1 VIEW   COMPARISON:  Single view of the chest 08/27/2014 at 12:14 a.m.   FINDINGS: A new right PICC is in place with the tip projecting in the mid to lower superior vena cava. Left basilar airspace disease is again seen. The right lung is clear. Heart size is upper normal. Loop recorder is noted. No pneumothorax is identified.   IMPRESSION: Tip of right PICC projects over the mid to lower superior vena cava.   Left basilar airspace disease.    ASSESSMENT/PLAN:  Pneumonia-cont. Abx. DM with renal complications-uncontrolled.  Due to hypoglycemic spells will not tighten control. Hypothyroidism-well controlled. Confusion-thought to be  secondary to infection in the hospital.  Check cbc w diff, bmp, UA, Cx &S.  Unlikely due to meds. Renovascular HTN-well controlled. CKD stage III-check renal functions. Anemia of CKD-check Hb.  I have reviewed patient's medical records received at admission/from hospitalization.  CPT CODE: 16109  Angela Cox, MD [facility] [phone]

## 2014-09-02 ENCOUNTER — Non-Acute Institutional Stay (SKILLED_NURSING_FACILITY): Payer: Commercial Managed Care - HMO | Admitting: Internal Medicine

## 2014-09-02 DIAGNOSIS — N189 Chronic kidney disease, unspecified: Secondary | ICD-10-CM

## 2014-09-02 DIAGNOSIS — N183 Chronic kidney disease, stage 3 unspecified: Secondary | ICD-10-CM

## 2014-09-02 DIAGNOSIS — D631 Anemia in chronic kidney disease: Secondary | ICD-10-CM

## 2014-09-02 LAB — CULTURE, BLOOD (ROUTINE X 2)
CULTURE: NO GROWTH
Culture: NO GROWTH

## 2014-09-03 NOTE — Progress Notes (Signed)
Patient ID: Erin FullingBetty J Schlie, female   DOB: Sep 20, 1941, 73 y.o.   MRN: [id number]           PROGRESS NOTE  DATE: 09/02/2014           [facility]  LEVEL OF CARE: SNF (31)  Acute Visit  CHIEF COMPLAINT:  Manage anemia of chronic kidney disease and chronic kidney disease.    HISTORY OF PRESENT ILLNESS: I was requested by the staff to assess the patient regarding above problem(s):  ANEMIA: The anemia is unstable. The staff denies fatigue, melena or hematochezia. No complications from the medications currently being used.   On 09/01/2014:  Hemoglobin 8.1, MCV 93.  In 06/2014:  Hemoglobin 10.5.  Patient is a poor historian.     CHRONIC KIDNEY DISEASE: The patient's chronic kidney disease is unstable.  Staff denies increasing lower extremity swelling or confusion. Last BUN and creatinine are:   On 09/01/2014:  BUN 21, creatinine 1.45.  In 06/2014:  Creatinine 1.32.  The patient is currently not on any renal toxic medications.    PAST MEDICAL HISTORY : Reviewed.  No changes/see problem list  CURRENT MEDICATIONS: Reviewed per MAR/see medication list  REVIEW OF SYSTEMS:  Unobtainable.  Patient is a poor historian.       PHYSICAL EXAMINATION  VS: see VS section  GENERAL: no acute distress, normal body habitus EYES: unable to assess      NECK: supple, trachea midline, no neck masses, no thyroid tenderness, no thyromegaly LYMPHATICS: no LAN in the neck, no supraclavicular LAN RESPIRATORY: breathing is even & unlabored, BS CTAB CARDIAC: RRR, no murmur,no extra heart sounds, left lower extremity has +3 edema, right lower extremity has +2 edema      GI: abdomen soft, normal BS, no masses, no tenderness, no hepatomegaly, no splenomegaly PSYCHIATRIC: the patient is alert, unable to assess orientation, affect & behavior appropriate  ASSESSMENT/PLAN:  Anemia of chronic kidney disease.  Unstable problem.  Hemoglobin declined.  Recheck on 09/04/2014.  Chronic kidney  disease.  Unstable problem.  Recheck renal functions on 09/04/2014.    CPT CODE: [id number]           Angela CoxGayani Y Abimbola Aki, MD [facility] [phone]

## 2014-09-07 ENCOUNTER — Other Ambulatory Visit: Payer: Self-pay | Admitting: Internal Medicine

## 2014-09-12 ENCOUNTER — Encounter (HOSPITAL_COMMUNITY): Payer: Self-pay

## 2014-09-12 ENCOUNTER — Emergency Department (HOSPITAL_COMMUNITY)
Admission: EM | Admit: 2014-09-12 | Discharge: 2014-10-06 | Disposition: E | Payer: Medicare HMO | Attending: Emergency Medicine | Admitting: Emergency Medicine

## 2014-09-12 DIAGNOSIS — Z8739 Personal history of other diseases of the musculoskeletal system and connective tissue: Secondary | ICD-10-CM | POA: Insufficient documentation

## 2014-09-12 DIAGNOSIS — Z87891 Personal history of nicotine dependence: Secondary | ICD-10-CM | POA: Diagnosis not present

## 2014-09-12 DIAGNOSIS — E119 Type 2 diabetes mellitus without complications: Secondary | ICD-10-CM | POA: Insufficient documentation

## 2014-09-12 DIAGNOSIS — N189 Chronic kidney disease, unspecified: Secondary | ICD-10-CM | POA: Diagnosis not present

## 2014-09-12 DIAGNOSIS — Z91040 Latex allergy status: Secondary | ICD-10-CM | POA: Diagnosis not present

## 2014-09-12 DIAGNOSIS — Z794 Long term (current) use of insulin: Secondary | ICD-10-CM | POA: Diagnosis not present

## 2014-09-12 DIAGNOSIS — Z880 Allergy status to penicillin: Secondary | ICD-10-CM | POA: Insufficient documentation

## 2014-09-12 DIAGNOSIS — Z7982 Long term (current) use of aspirin: Secondary | ICD-10-CM | POA: Diagnosis not present

## 2014-09-12 DIAGNOSIS — R092 Respiratory arrest: Secondary | ICD-10-CM

## 2014-09-12 DIAGNOSIS — Z8673 Personal history of transient ischemic attack (TIA), and cerebral infarction without residual deficits: Secondary | ICD-10-CM | POA: Diagnosis not present

## 2014-09-12 DIAGNOSIS — E038 Other specified hypothyroidism: Secondary | ICD-10-CM | POA: Insufficient documentation

## 2014-09-12 DIAGNOSIS — I469 Cardiac arrest, cause unspecified: Secondary | ICD-10-CM | POA: Diagnosis present

## 2014-09-12 DIAGNOSIS — E785 Hyperlipidemia, unspecified: Secondary | ICD-10-CM | POA: Diagnosis not present

## 2014-09-12 DIAGNOSIS — I129 Hypertensive chronic kidney disease with stage 1 through stage 4 chronic kidney disease, or unspecified chronic kidney disease: Secondary | ICD-10-CM | POA: Diagnosis not present

## 2014-09-12 DIAGNOSIS — Z79899 Other long term (current) drug therapy: Secondary | ICD-10-CM | POA: Insufficient documentation

## 2014-09-12 MED ORDER — EPINEPHRINE HCL 0.1 MG/ML IJ SOSY
PREFILLED_SYRINGE | INTRAMUSCULAR | Status: DC | PRN
  Administered 2014-09-12: 1 mg via INTRAVENOUS

## 2014-09-16 ENCOUNTER — Encounter: Payer: Self-pay | Admitting: Internal Medicine

## 2014-10-06 NOTE — Code Documentation (Signed)
Patient time of death occurred at 1926 °

## 2014-10-06 NOTE — ED Provider Notes (Signed)
CSN: [id number]     Arrival date & time 09/28/2014  1918 History   First MD Initiated Contact with Patient February 04, 2014 1926     No chief complaint on file.    (Consider location/radiation/quality/duration/timing/severity/associated sxs/prior Treatment) Patient is a 73 y.o. female presenting with general illness. The history is provided by the EMS personnel.  Illness Location:  Difficulty breathing Quality:  Severe Severity:  Severe Onset quality:  Sudden Duration: ~50 minutes. Timing:  Constant Progression:  Unchanged Chronicity:  New Context:  Having breathing difficulty, was getting nebs at the nursing home. Staff checked on her and she was apneic and pulseless Relieved by:  Nothing Worsened by:  Nothing Associated symptoms: cough   Associated symptoms: no vomiting     Past Medical History  Diagnosis Date  . Hypertension   . Diabetes mellitus   . Chronic renal insufficiency   . Hyperlipidemia   . Osteopenia   . CVA (cerebral infarction)   . Thyroid disease   . Shortness of breath    Past Surgical History  Procedure Laterality Date  . Eye surgery    . Loop recorder implant  03-24-2014    [name] [name] implanted by Dr [name] [name] for cryptogenic stroke  . Tee without cardioversion N/A 03/23/2014    Procedure: TRANSESOPHAGEAL ECHOCARDIOGRAM (TEE);  Surgeon: [name] MassonKatarina H Nelson, MD;  [facility];  Service: Cardiovascular;  Laterality: N/A;   Family History  Problem Relation Age of Onset  . Diabetes Mother   . Hypertension Mother    History  Substance Use Topics  . Smoking status: Former Smoker -- 0.50 packs/day for 60 years    Types: Cigarettes    Quit date: 06/09/2014  . Smokeless tobacco: Never Used     Comment: pt states "sometimes [name] quit smoking for about 2 weeks then [name] start back"  . Alcohol Use: Yes     Comment: occasionally   OB History    No data available     Review of Systems  Unable to perform ROS: Intubated  Respiratory: Positive for cough.    Gastrointestinal: Negative for vomiting.      Allergies  Erythromycin; Iodine; Penicillins; and Latex  Home Medications   Prior to Admission medications   Medication Sig Start Date End Date Taking? Authorizing Provider  acetaminophen (TYLENOL) 325 MG tablet Take 650 mg by mouth every 6 (six) hours as needed for mild pain.    Historical Provider, MD  albuterol (PROVENTIL) (2.5 MG/3ML) 0.083% nebulizer solution Take 2.5 mg by nebulization every 6 (six) hours as needed for wheezing or shortness of breath.    Historical Provider, MD  atorvastatin (LIPITOR) 40 MG tablet Take 40 mg by mouth daily.    Historical Provider, MD  budesonide-formoterol (SYMBICORT) 80-4.5 MCG/ACT inhaler Inhale 2 puffs into the lungs 2 (two) times daily.    Historical Provider, MD  carvedilol (COREG) 25 MG tablet Take 25 mg by mouth 2 (two) times daily with a meal.    Historical Provider, MD  cloNIDine (CATAPRES - DOSED IN MG/24 HR) 0.3 mg/24hr patch Place 0.3 mg onto the skin once a week. On friday    Historical Provider, MD  dipyridamole-aspirin (AGGRENOX) 200-25 MG per 12 hr capsule Take 1 capsule by mouth 2 (two) times daily. 08/20/14   [name] BennettEzequiel Zamora, MD  feeding supplement, ENSURE, (ENSURE) PUDG Take 1 Container by mouth 2 (two) times daily between meals. 08/29/14   [name] CantorSaima Rizwan, MD  hydrALAZINE (APRESOLINE) 100 MG tablet Take 100 mg by mouth 3 (  three) times daily.    Historical Provider, MD  insulin aspart (NOVOLOG) 100 UNIT/ML injection Inject 0-15 Units into the skin 4 (four) times daily -  before meals and at bedtime. 70-200= 0 units; 201-250= 2 units; 251-300= 4 units; 301-350= 6 units; 351-400= 8 units; >400 give 10 units and recheck CBG in 2 hours. If CBG continues >400= call MD    Historical Provider, MD  insulin glargine (LANTUS) 100 UNIT/ML injection Inject 13 Units into the skin at bedtime.    Historical Provider, MD  isosorbide dinitrate (ISORDIL) 10 MG tablet Take 10 mg by mouth 3 (three) times daily.     Historical Provider, MD  levothyroxine (SYNTHROID, LEVOTHROID) 50 MCG tablet Take 50 mcg by mouth daily before breakfast.    Historical Provider, MD  senna-docusate (SENOKOT-S) 8.6-50 MG per tablet Take 1 tablet by mouth at bedtime as needed for mild constipation. 08/20/14   [name] BennettEzequiel Zamora, MD   There were no vitals taken for this visit. Physical Exam  Constitutional: She appears well-developed and well-nourished. She appears distressed.  HENT:  Head: Normocephalic and atraumatic.  Thick yellow secretions in the ET tube  Eyes:  Fixed and dilated  Neck: Neck supple. No tracheal deviation present.  Cardiovascular: Exam reveals no friction rub.   No murmur heard. No pulse  Pulmonary/Chest: She is in respiratory distress (apneic). She has no wheezes. She has rales (coarse, diffuse).  Abdominal: Soft. She exhibits no distension. There is no tenderness. There is no rebound.  Musculoskeletal: Normal range of motion. She exhibits no edema.  Neurological:  GCS 3  Skin: No rash noted. She is not diaphoretic.  Nursing note and vitals reviewed.   ED Course  Procedures (including critical care time) Labs Review Labs Reviewed - No data to display  Imaging Review No results found.   EKG Interpretation None     Cardiopulmonary Resuscitation (CPR) Procedure Note Directed/Performed by: [name] [name], [name] [name] personally directed ancillary staff and/or performed CPR in an effort to regain return of spontaneous circulation and to maintain cardiac, neuro and systemic perfusion.   MDM   Final diagnoses:  Respiratory arrest  Cardiac arrest    [age] year old female brought in as a CPR in progress. She was having some difficulty breathing at the nursing home, was receiving breathing treatments. Nursing home staff stepped out of the room for about 10 minutes and then when they came back and she was pulseless and Neck. She received 40 minutes of CPR with 9 rounds of epinephrine. She had  asystole the whole time except for 1 episode of fine V. Fib about 5 minutes prior to arrival. She received a 200 J defibrillation but then went back into asystole. Here pulseless, intubated. Thick yellow mucus in the ET tube. She received 1 round of epinephrine from the and then had no cardiac motion on ultrasound 3 minutes after that epinephrine. She had total down time of minimum 50 minutes with 45 minutes of CPR. Time of death [id number].    Elwin MochaBlair Zohair Epp, MD 08-16-2014 [phone]

## 2014-10-06 NOTE — ED Notes (Signed)
Pt arrived via [facility] at 1918 with CPR in progress, pt transferred on backboard to ED stretcher, CPR continued, at [employer] Lucas was paused for a pulse check, pt remained in asystole, VO given from EDP [place] to administer Epi x1, at [employer]1925 Lucas paused for another pulse check, pt noted to remain in asystole, bedside ultrasound used to determine any cardiac rhythm, pt showed no rhythm on ultrasound, cardiac monitor, or manual pulse check, time of death called by EDP Gwendolyn GrantWalden at [phone].

## 2014-10-06 NOTE — Code Documentation (Addendum)
Organ procurement team notified. Per [name] [name] pt's age rules her out as a tissue, eye, and [name] [name] candidate. Donor # [id number]

## 2014-10-06 NOTE — Progress Notes (Signed)
   11-Dec-2013 2200  Clinical Encounter Type  Visited With Family;Health care provider  Visit Type Follow-up;Death  Spiritual Encounters  Spiritual Needs Grief support  Stress Factors  Family Stress Factors Loss   Chaplain was paged at 9:39 PM and notified that the family of the patient was requesting a morgue visitation. Chaplain introduced himself to the patient's family in the ED waiting room. Patient's in-laws explained that the morgue visitation was primarily for the patient's husband, their brother. Patient's husband was grieving heavily and was not able to stand. Chaplain accompanied patient's family [facility]. Patient's husband grieved heavily but was well supported by other family members. Family is awaiting the arrival of the patient's son who is also the patient's power of attorney, however family did not wish to continue their grieving in the morgue and rather chose to provide additional emotional support for the patient's husband in the ED waiting room. Page Merrilyn Puman-Call Chaplain if further support is needed. [place]ston NeighborStrother, Tenya Araque R, Chaplain  10:51 PM

## 2014-10-06 NOTE — ED Notes (Signed)
Per [facility], pt with a recent diagnosis of PNA. [facility] pt was given a breathing treatment and suctioned, they report they returned 10 mins after her treatment to find her pulseless and apneic. They started CPR and called 911 at 1831, [facility] at [id number], assumed care, CPR continued, placed pt on [place], placed I/O to Right fib/tib, intubated with a 7 cm ETT with positive color change and bilateral breath sounds auscultated, NS bolus being administered, and 9 Epi given prior to arrival to ED. At [facility] pt went into an agonal rhythm with several ventricular beats, shocked mode advised and administered, pt returned to an asystole rhythm. Pt was given assisted ventilation via BVM through out route.

## 2014-10-06 DEATH — deceased

## 2014-10-15 ENCOUNTER — Encounter (HOSPITAL_COMMUNITY): Payer: Self-pay | Admitting: Internal Medicine

## 2015-10-07 ENCOUNTER — Encounter: Payer: Self-pay | Admitting: Gastroenterology

## 2015-10-18 IMAGING — CR DG CHEST 1V PORT
1 series · 1 of 1 positions shown · non-contrast
Comparison: 04/21/2014.

CLINICAL DATA: Unresponsive.

EXAM:
PORTABLE CHEST - 1 VIEW

[AP]
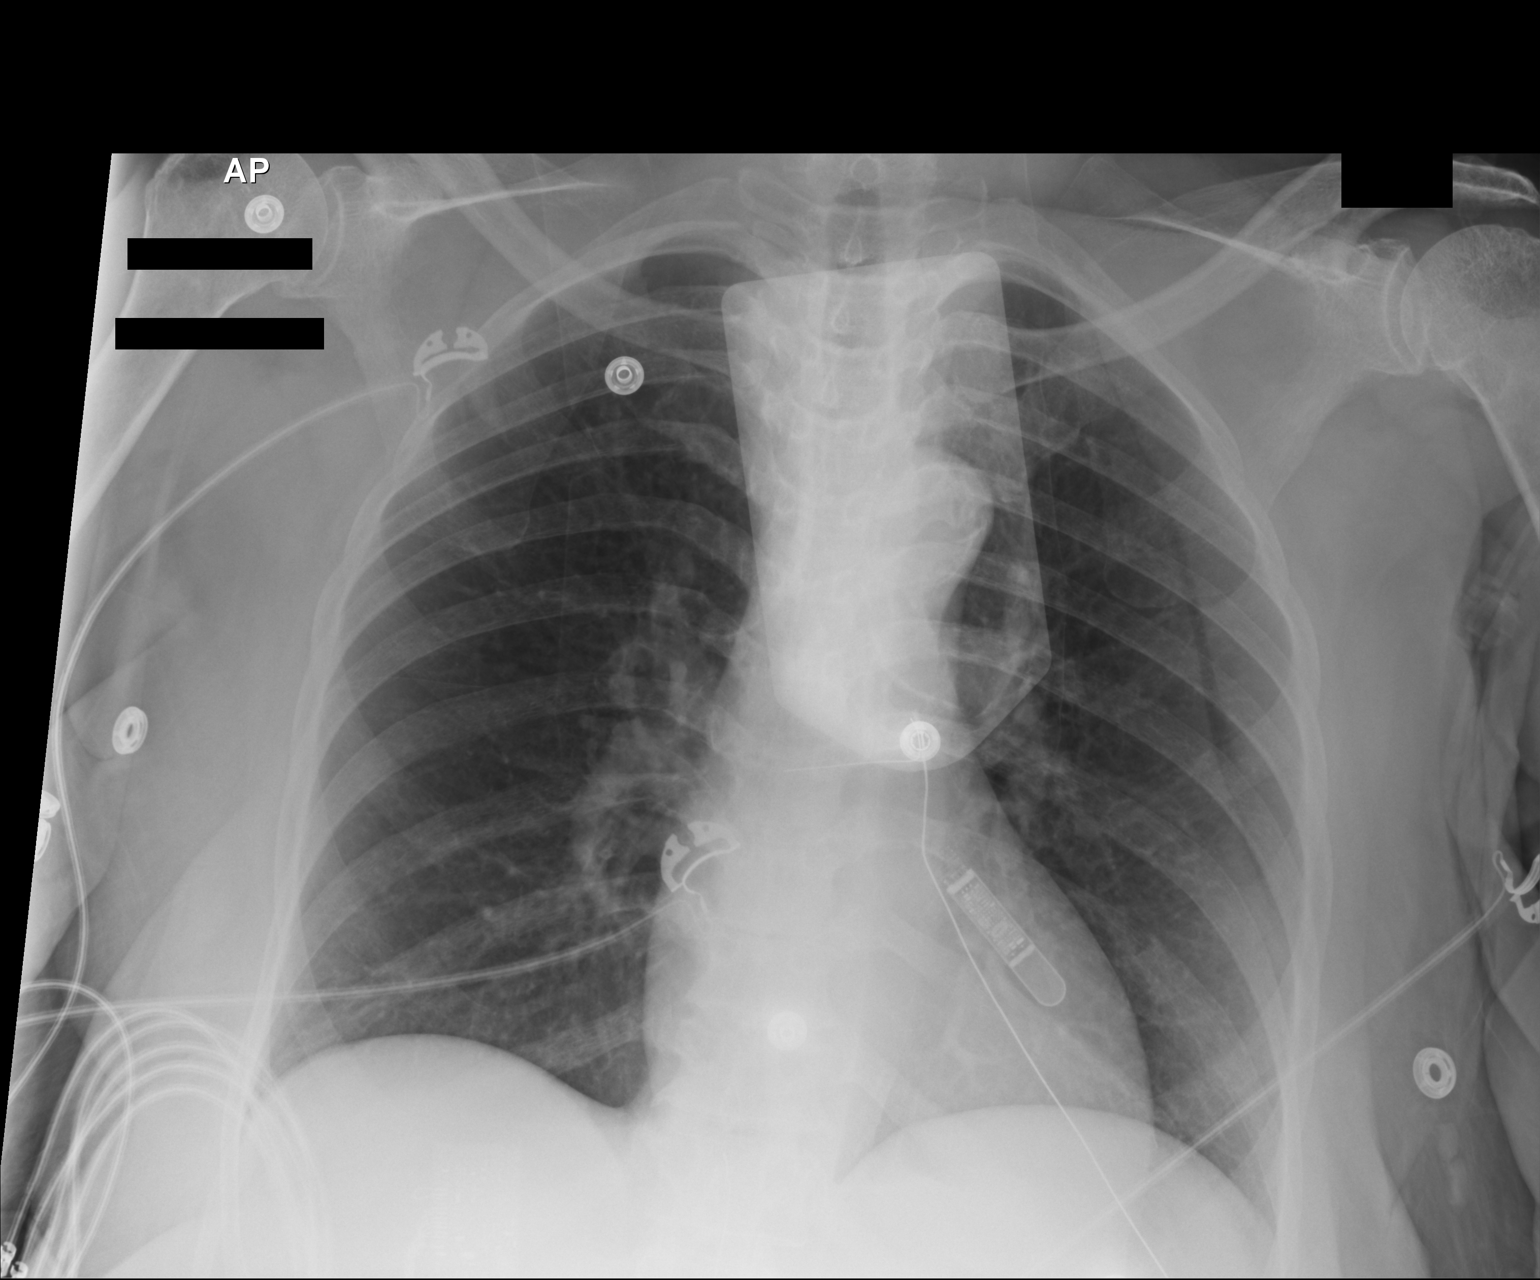

[1 of 1 positions shown; findings below may reference images not displayed]

FINDINGS: Mediastinum and hilar structures normal. Lungs are clear. Heart size
normal. Pacing pads and small monitoring device noted over the
chest. No pleural effusion or pneumothorax. No acute bony
abnormality.
IMPRESSION: No active disease.

## 2016-01-10 IMAGING — CR DG CHEST 1V PORT
1 series · 1 of 1 positions shown · non-contrast
Comparison: Portable chest x-ray of today's date.

CLINICAL DATA: Interval intubation of the patient ; assess support
tube and line. Placement

EXAM:
PORTABLE CHEST - 1 VIEW

[AP]
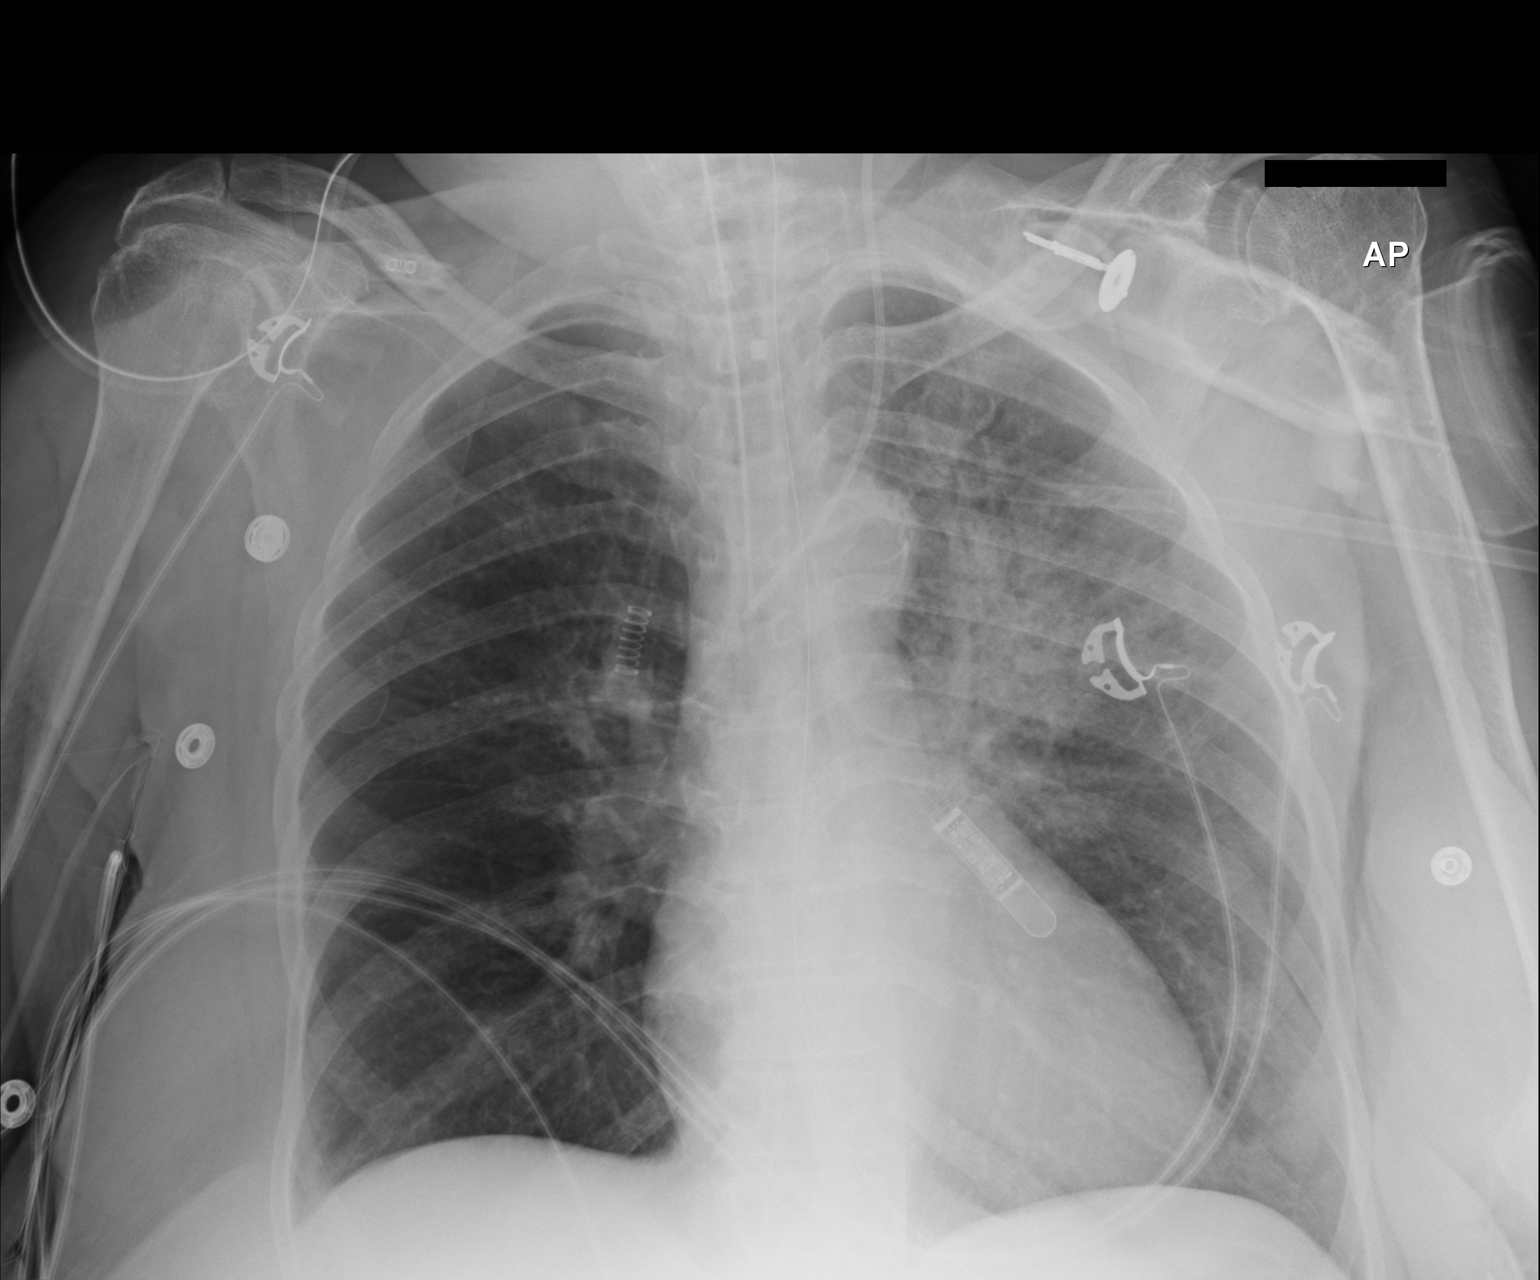

[1 of 1 positions shown; findings below may reference images not displayed]

FINDINGS: The endotracheal tube tip lies 2 cm above the crotch of the carina.
The right internal jugular venous catheter tip lies at the level of
the junction of the right and left brachiocephalic veins. The
esophagogastric tube tip projects below the inferior margin of the
image. A loop recorder is visible over the left atrial region. The
cardiac silhouette is normal in size. The pulmonary vascularity is
not engorged. There is confluent alveolar opacity in the left
perihilar region that is more conspicuous than on the earlier study.
The bony thorax exhibits no acute abnormality.
IMPRESSION: 1. The endotracheal tube tip lies 2.1 cm above the crotch of the
carina. Withdrawal by 2-3 cm is recommended to avoid accidental
mainstem bronchus intubation.
2. The left internal jugular venous catheter and the esophagogastric
tube are in appropriate position radiographically. There is no
postprocedure pneumothorax or hemothorax.
3. There is increased alveolar opacity in the low left perihilar
region consistent with pneumonia.
4. These results were called by telephone at the time of
interpretation on 07/24/2014 at [DATE] to Jl Billiot, RN, who
verbally acknowledged these results.

## 2016-01-10 IMAGING — CR DG CHEST 1V PORT
1 series · 1 of 1 positions shown · non-contrast
Comparison: 06/13/2014

CLINICAL DATA: Recent aspiration

EXAM:
PORTABLE CHEST - 1 VIEW

[AP]
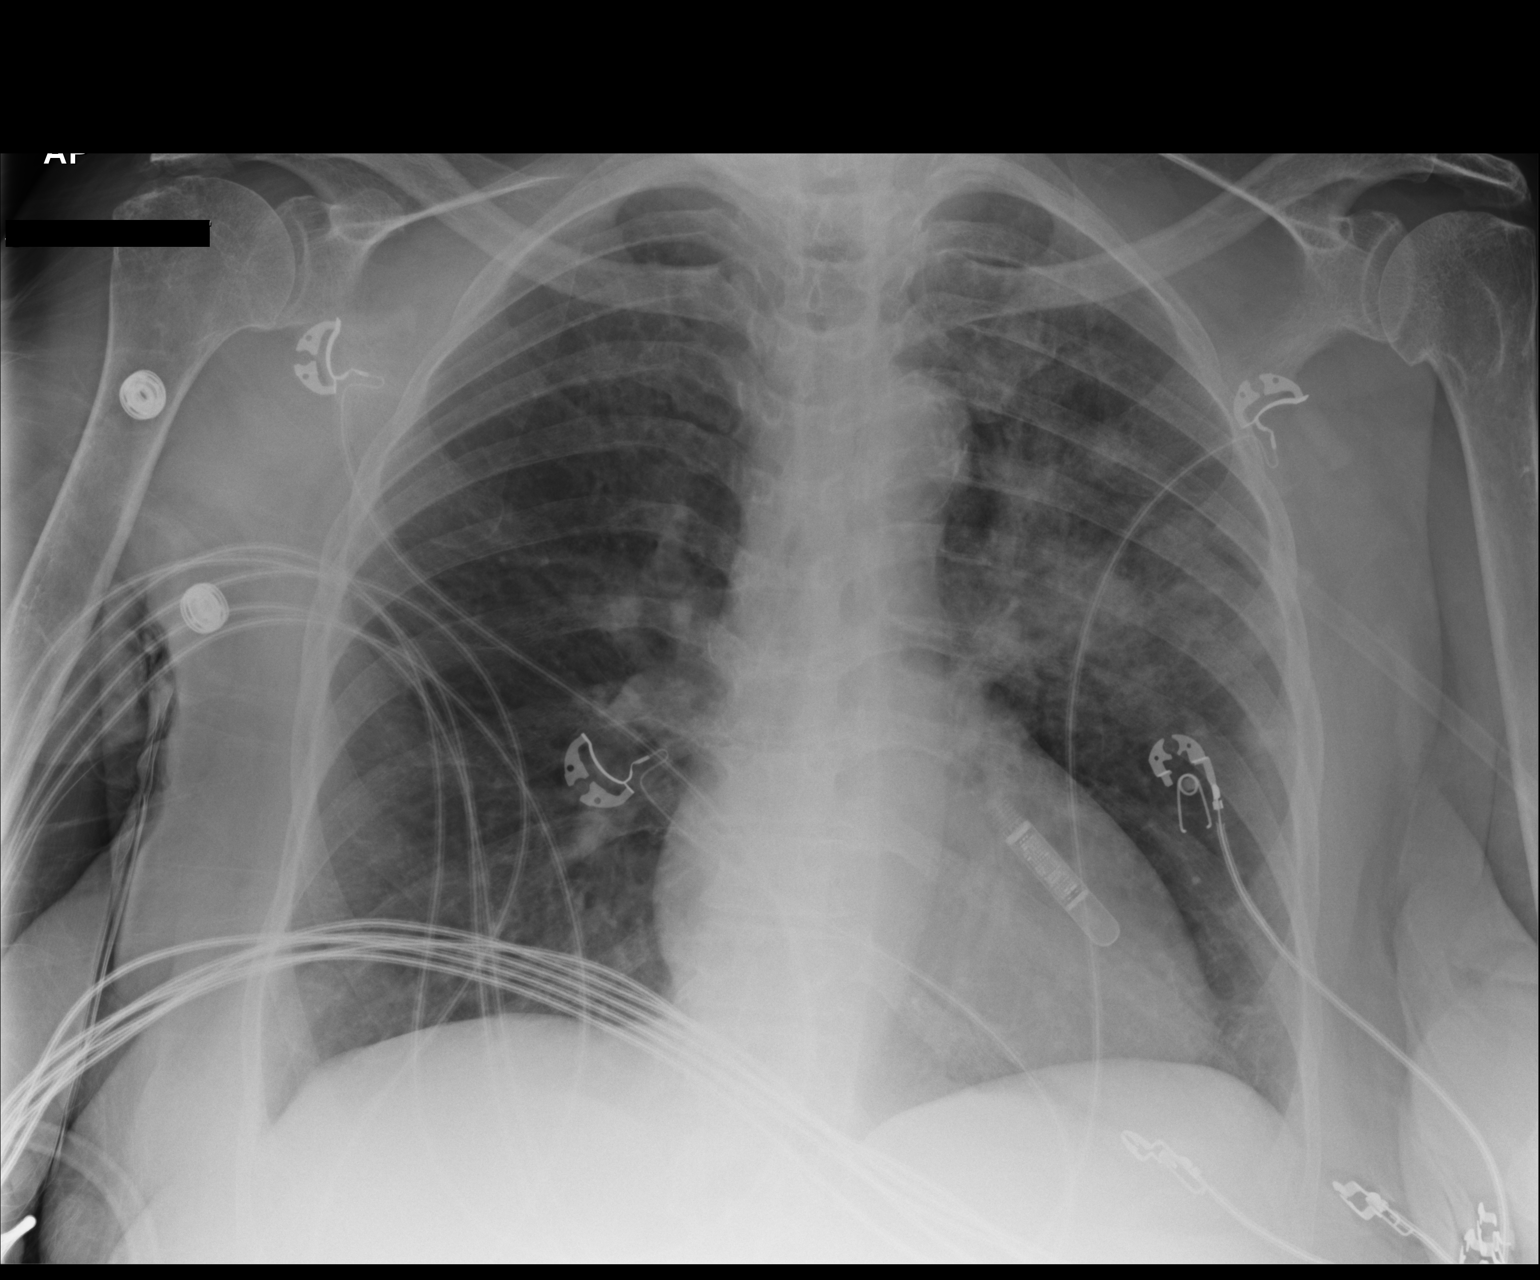

[1 of 1 positions shown; findings below may reference images not displayed]

FINDINGS: The cardiac shadow is stable. Monitoring device is again seen. The
right lung is clear. The left lung demonstrates patchy infiltrate in
the upper lobe which may be related to the patient's recent
aspiration.
IMPRESSION: Left upper lobe infiltrate. Followup films to resolution are
recommended.

## 2016-01-14 IMAGING — CR DG ABD PORTABLE 1V
1 series · 1 of 1 positions shown · non-contrast
Comparison: None.

CLINICAL DATA: Endotracheal tube and orogastric tube placement

EXAM:
PORTABLE ABDOMEN - 1 VIEW

[AP]
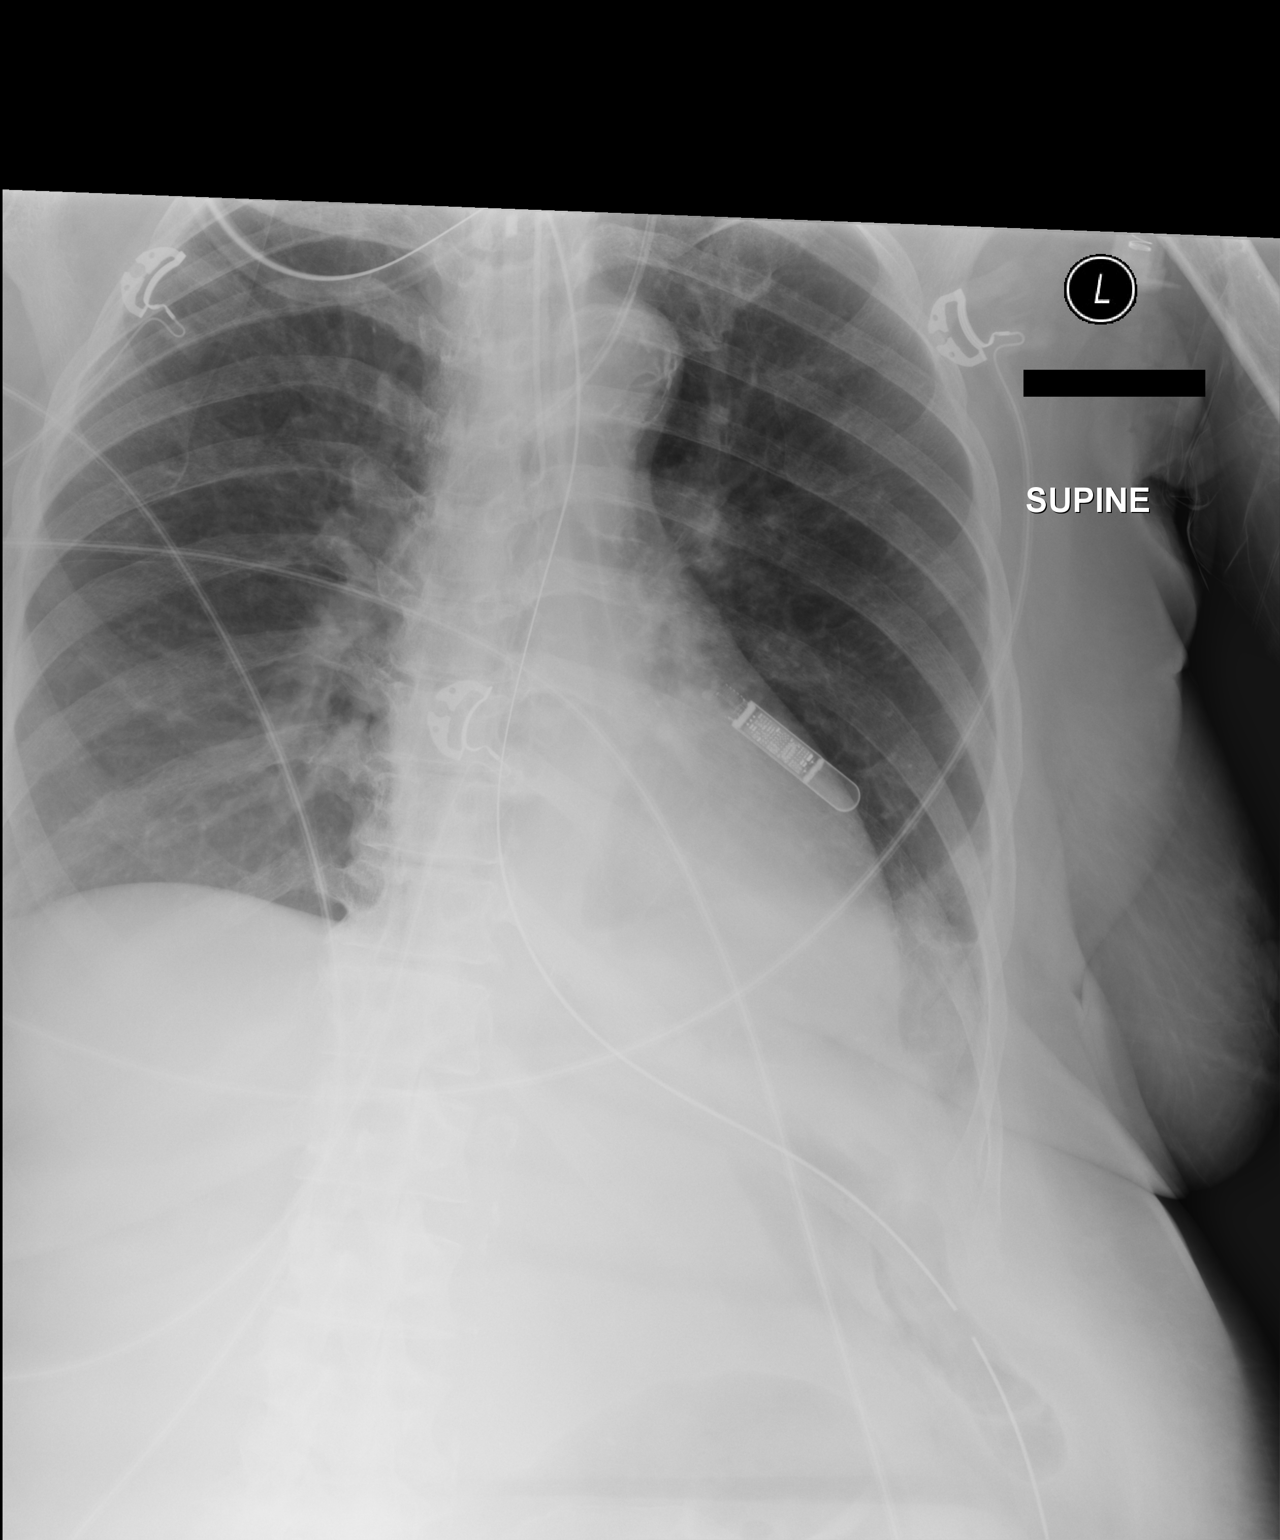

[1 of 1 positions shown; findings below may reference images not displayed]

FINDINGS: Orogastric tube crosses the gastroesophageal junction with tip in
the left upper quadrant in the anticipated position of stomach.
Endotracheal tube is also seen on this study with tip about 12 mm
above the carinal.
IMPRESSION: Endotracheal tube and OG tube as described.

## 2016-01-30 IMAGING — XA IR FLUORO RM 0-60 MIN
1 series · 12 of 19 positions shown · non-contrast
Comparison: CT abdomen pelvis - 08/12/2014

INDICATION: Poor venous access. In need of intravenous access for medication
administration and blood draws. Patient with history of dysphasia
secondary to CVA. Request made for placement of a percutaneous
gastrostomy tube.

EXAM:
1. ULTRASOUND AND FLUOROSCOPIC GUIDED PICC LINE INSERTION
2. FLUOROSCOPIC GUIDED RECTAL ADMINISTRATION OF BARIUM TO OPACIFY
THE TRANSVERSE COLON.
TECHNIQUE: The procedure, risks, benefits, and alternatives were explained to
the patient's son and informed written consent was obtained. A
timeout was performed prior to the initiation of the procedure.

[Series 1: run · 12 of 19 slices shown]
[im 1/19]
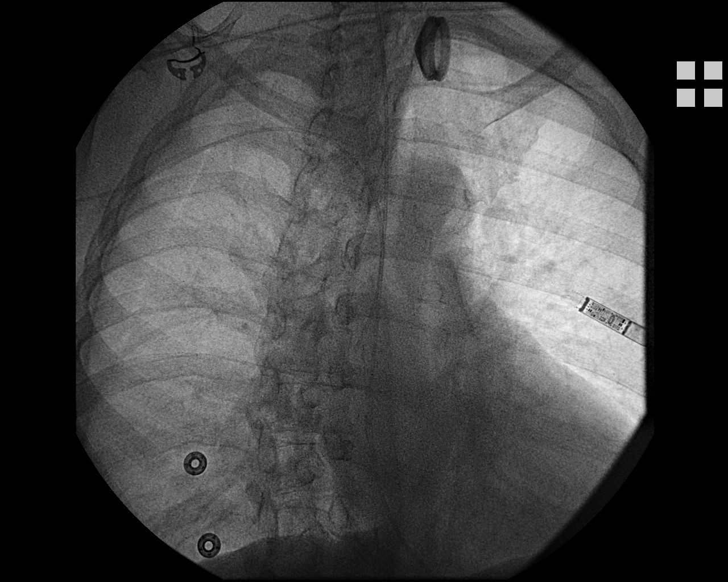
[im 3/19]
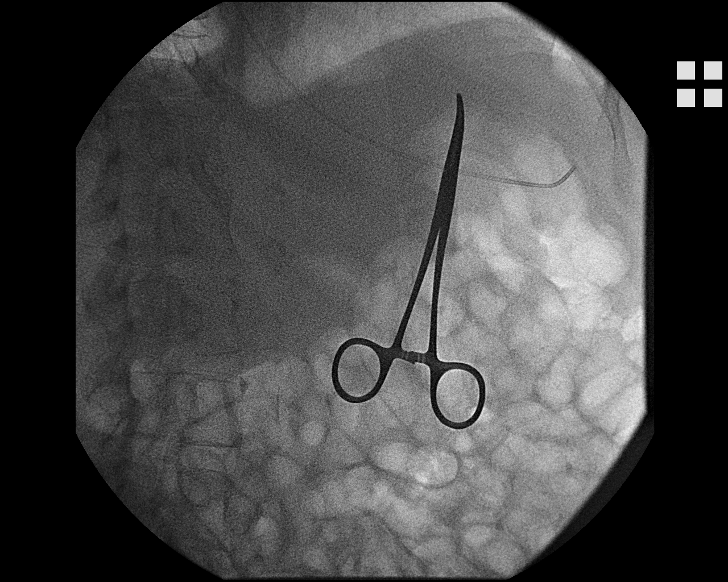
[im 4/19]
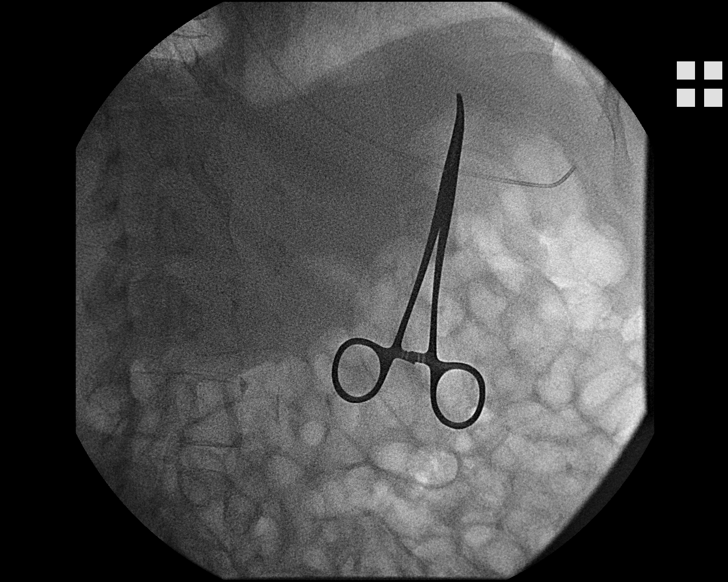
[im 6/19]
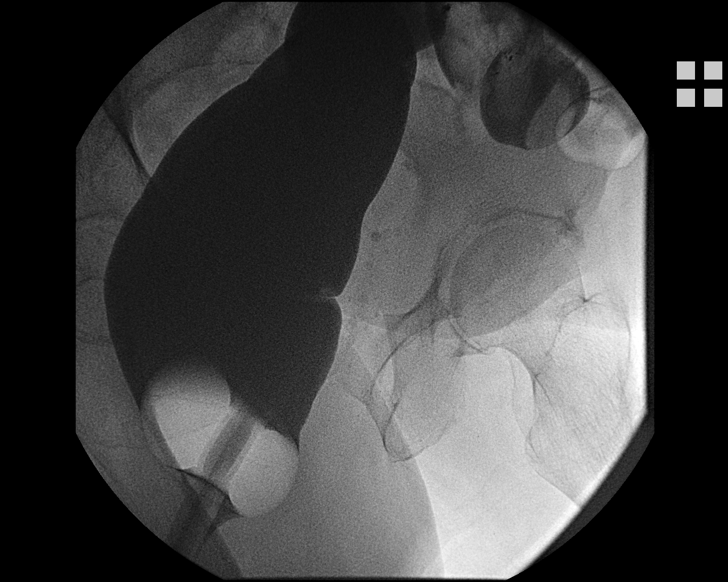
[im 8/19]
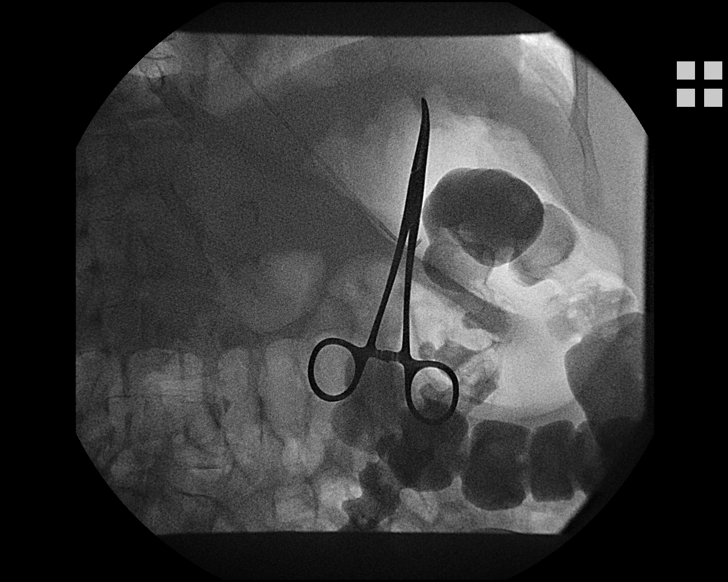
[im 9/19]
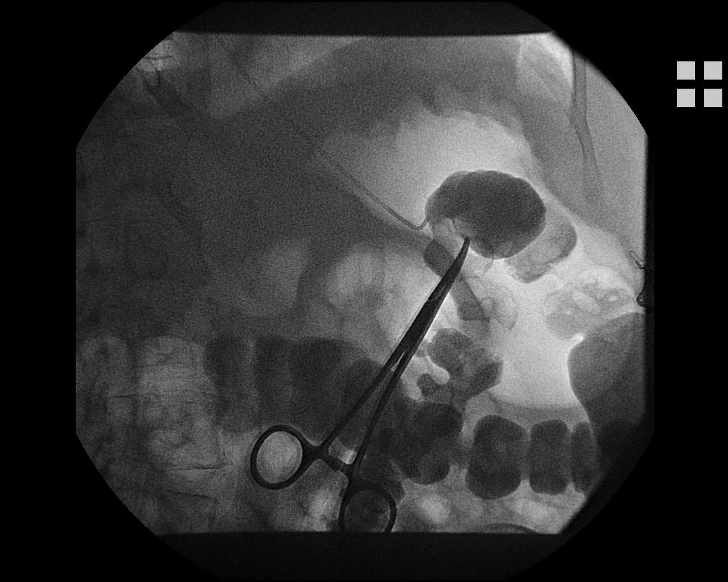
[im 11/19]
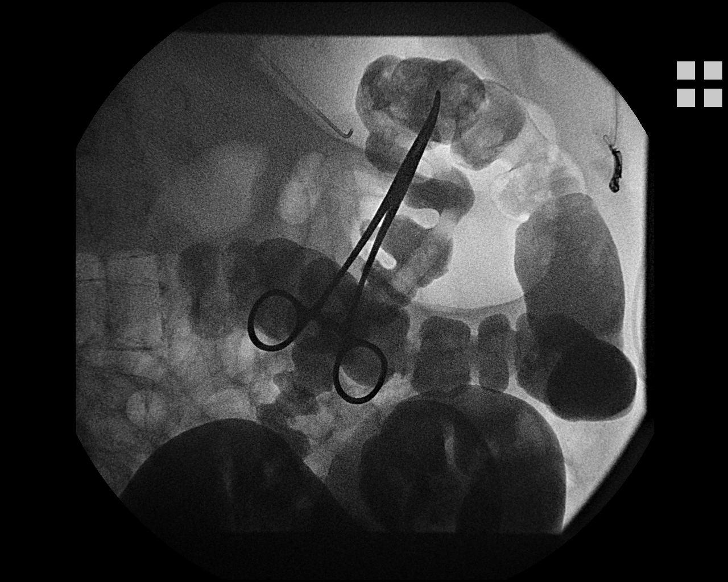
[im 12/19]
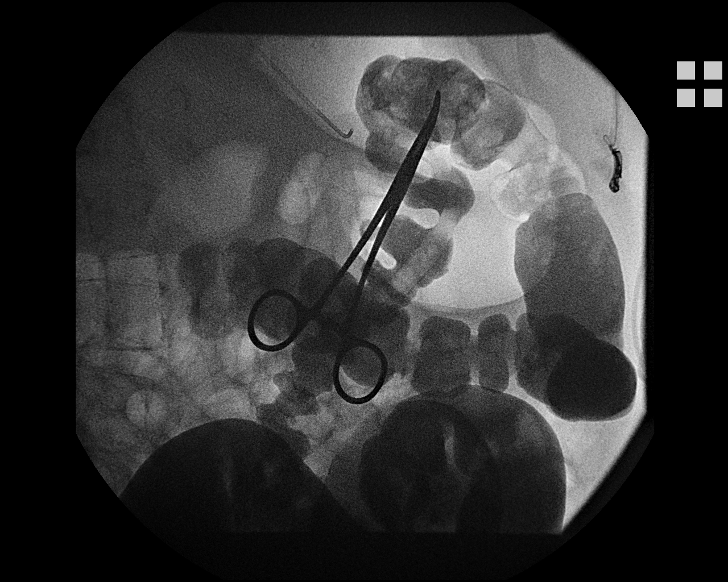
[im 14/19]
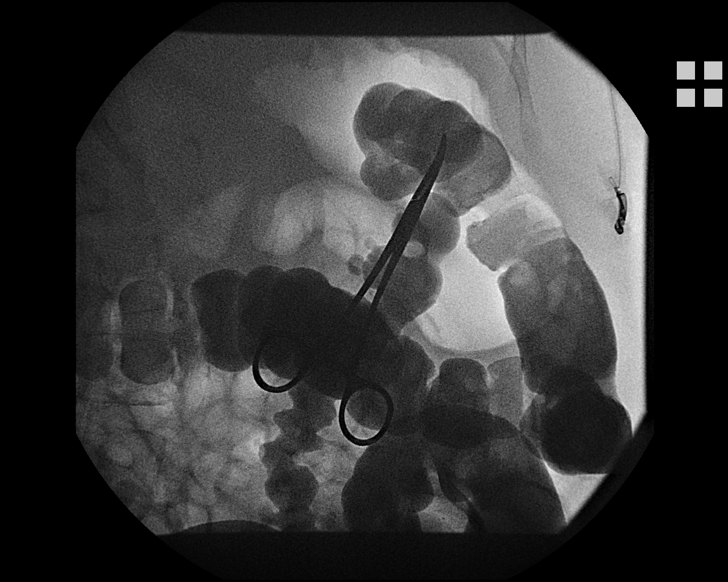
[im 16/19]
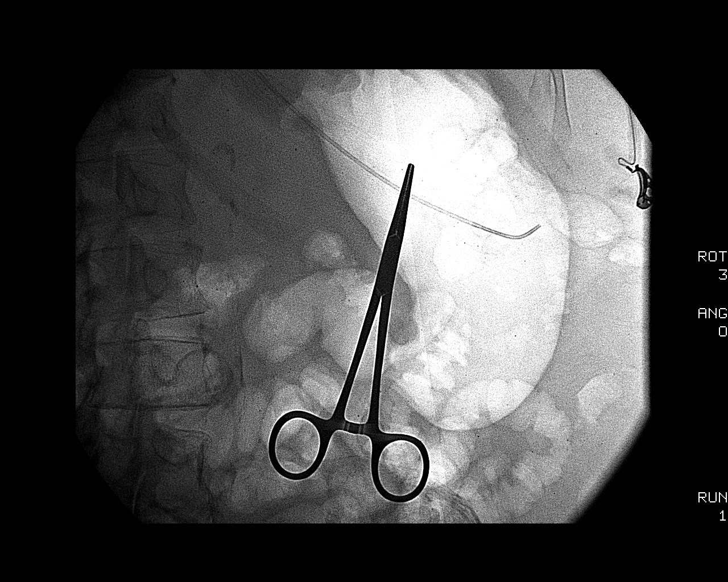
[im 17/19]
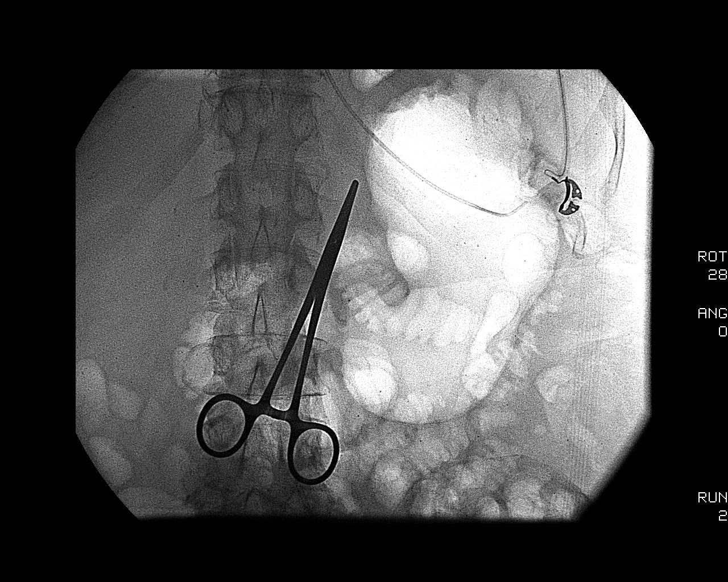
[im 19/19]
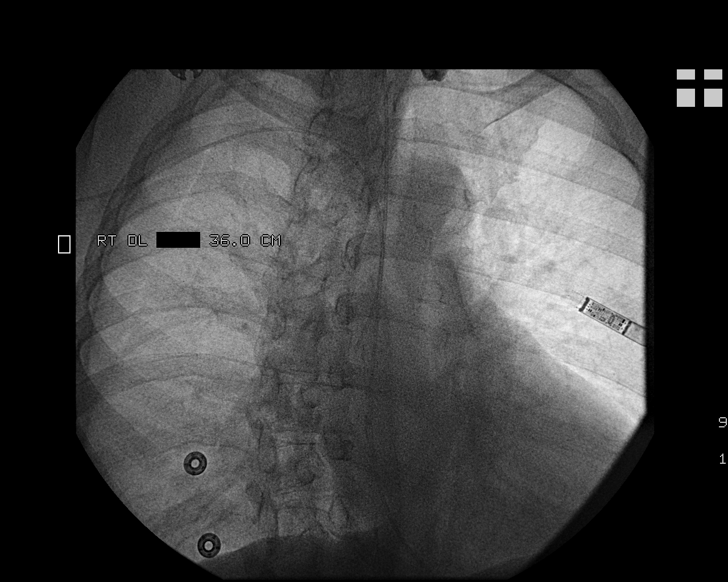

[12 of 19 positions shown; findings below may reference images not displayed]

MEDICATIONS:
None.

CONTRAST:  None

FLUOROSCOPY TIME:  3 min, 18 seconds

COMPLICATIONS:
None immediate
The right upper extremity was prepped with chlorhexidine in a
sterile fashion, and a sterile drape was applied covering the
operative field. Maximum barrier sterile technique with sterile
gowns and gloves were used for the procedure. A timeout was
performed prior to the initiation of the procedure. Local anesthesia
was provided with 1% lidocaine.

Under direct ultrasound guidance, the right basilic vein was
accessed with a micropuncture kit after the overlying soft tissues
were anesthetized with 1% lidocaine. An ultrasound image was saved
for documentation purposes. A guidewire was advanced to the level of
the superior caval-atrial junction for measurement purposes and the
PICC line was cut to length. A peel-away sheath was placed and a 36
cm, 5 French, dual lumen was inserted to level of the superior
caval-atrial junction. A post procedure spot fluoroscopic was
obtained. The catheter easily aspirated and flushed and was sutured
in place. A dressing was placed. The patient tolerated the procedure
well without immediate post procedural complication.

At this point, attention was paid towards attempted percutaneous
gastrostomy tube placement.

Multiple spot fluoroscopic images were obtained of the right upper
abdominal quadrant appears to demonstrate the colon overlying the
ventral aspect of the stomach as was demonstrated on preceding
abdominal CT.

As such, barium was administered via the rectum with adequate
opacification of the transverse colon, confirming this finding. As
such, attempted percutaneous gastrostomy tube placement was not
performed
FINDINGS: After catheter placement, the PICC tip lies within the superior
cavoatrial junction. The catheter aspirates and flushes normally and
is ready for immediate use.

The splenic flexure of the colon overlies the ventral wall of the
stomach without a safe percutaneous window for percutaneous
gastrostomy tube placement.
IMPRESSION: 1. Successful ultrasound and fluoroscopic guided placement of a
right basilic vein approach, cm, 5 French, dual lumen PICC with tip
at the superior caval-atrial junction. The PICC line is ready for
immediate use.
2. No safe percutaneous window for gastrostomy tube placement.

PLAN:
This patient could be referred to surgery for a surgical placed
gastrostomy tube as clinically indicated.
